# Patient Record
Sex: Female | Born: 1935
Health system: Southern US, Community
[De-identification: ages and names within clinical notes are randomized; demographics above are authoritative.]

## PROBLEM LIST (undated history)

## (undated) DIAGNOSIS — E1122 Type 2 diabetes mellitus with diabetic chronic kidney disease: Secondary | ICD-10-CM

## (undated) DIAGNOSIS — N181 Chronic kidney disease, stage 1: Secondary | ICD-10-CM

## (undated) DIAGNOSIS — H35033 Hypertensive retinopathy, bilateral: Secondary | ICD-10-CM

## (undated) DIAGNOSIS — M85851 Other specified disorders of bone density and structure, right thigh: Secondary | ICD-10-CM

## (undated) DIAGNOSIS — I639 Cerebral infarction, unspecified: Secondary | ICD-10-CM

## (undated) DIAGNOSIS — E119 Type 2 diabetes mellitus without complications: Principal | ICD-10-CM

## (undated) DIAGNOSIS — I10 Essential (primary) hypertension: Secondary | ICD-10-CM

## (undated) DIAGNOSIS — K259 Gastric ulcer, unspecified as acute or chronic, without hemorrhage or perforation: Secondary | ICD-10-CM

## (undated) DIAGNOSIS — E785 Hyperlipidemia, unspecified: Secondary | ICD-10-CM

## (undated) DIAGNOSIS — K219 Gastro-esophageal reflux disease without esophagitis: Secondary | ICD-10-CM

## (undated) DIAGNOSIS — D638 Anemia in other chronic diseases classified elsewhere: Secondary | ICD-10-CM

## (undated) HISTORY — PX: ESOPHAGOGASTRODUODENOSCOPY: SHX1529

## (undated) HISTORY — DX: Hypertensive retinopathy, bilateral: H35.033

## (undated) HISTORY — DX: Type 2 diabetes mellitus with diabetic chronic kidney disease: E11.22

## (undated) HISTORY — DX: Hyperlipidemia, unspecified: E78.5

## (undated) HISTORY — DX: Type 2 diabetes mellitus without complications: E11.9

## (undated) HISTORY — DX: Anemia in other chronic diseases classified elsewhere: D63.8

## (undated) HISTORY — PX: COLONOSCOPY: SHX174

## (undated) HISTORY — DX: Chronic kidney disease, stage 1: N18.1

## (undated) HISTORY — DX: Cerebral infarction, unspecified: I63.9

## (undated) HISTORY — DX: Gastro-esophageal reflux disease without esophagitis: K21.9

## (undated) HISTORY — DX: Essential (primary) hypertension: I10

## (undated) HISTORY — DX: Gastric ulcer, unspecified as acute or chronic, without hemorrhage or perforation: K25.9

---

## 1998-02-15 ENCOUNTER — Encounter: Admission: RE | Admit: 1998-02-15 | Discharge: 1998-02-15 | Payer: Self-pay | Admitting: Internal Medicine

## 1998-04-24 ENCOUNTER — Ambulatory Visit: Admission: RE | Admit: 1998-04-24 | Discharge: 1998-04-24 | Payer: Self-pay

## 1998-07-05 ENCOUNTER — Encounter: Admission: RE | Admit: 1998-07-05 | Discharge: 1998-07-05 | Payer: Self-pay | Admitting: Hematology and Oncology

## 1998-09-11 ENCOUNTER — Encounter: Admission: RE | Admit: 1998-09-11 | Discharge: 1998-09-11 | Payer: Self-pay | Admitting: Internal Medicine

## 1998-10-18 ENCOUNTER — Ambulatory Visit (HOSPITAL_COMMUNITY): Admission: RE | Admit: 1998-10-18 | Discharge: 1998-10-18 | Payer: Self-pay

## 1999-02-18 ENCOUNTER — Encounter: Admission: RE | Admit: 1999-02-18 | Discharge: 1999-02-18 | Payer: Self-pay | Admitting: Internal Medicine

## 1999-02-25 ENCOUNTER — Encounter: Admission: RE | Admit: 1999-02-25 | Discharge: 1999-02-25 | Payer: Self-pay | Admitting: Internal Medicine

## 1999-07-29 ENCOUNTER — Encounter: Admission: RE | Admit: 1999-07-29 | Discharge: 1999-07-29 | Payer: Self-pay | Admitting: Internal Medicine

## 1999-08-02 ENCOUNTER — Encounter: Admission: RE | Admit: 1999-08-02 | Discharge: 1999-08-02 | Payer: Self-pay | Admitting: Internal Medicine

## 1999-11-04 ENCOUNTER — Encounter: Admission: RE | Admit: 1999-11-04 | Discharge: 1999-11-04 | Payer: Self-pay | Admitting: Internal Medicine

## 1999-11-19 ENCOUNTER — Encounter: Admission: RE | Admit: 1999-11-19 | Discharge: 1999-11-19 | Payer: Self-pay | Admitting: Hematology and Oncology

## 1999-11-22 ENCOUNTER — Ambulatory Visit (HOSPITAL_COMMUNITY): Admission: RE | Admit: 1999-11-22 | Discharge: 1999-11-22 | Payer: Self-pay

## 2000-03-19 ENCOUNTER — Encounter: Admission: RE | Admit: 2000-03-19 | Discharge: 2000-03-19 | Payer: Self-pay | Admitting: Internal Medicine

## 2000-07-20 ENCOUNTER — Encounter: Admission: RE | Admit: 2000-07-20 | Discharge: 2000-07-20 | Payer: Self-pay

## 2000-10-12 ENCOUNTER — Encounter: Admission: RE | Admit: 2000-10-12 | Discharge: 2000-10-12 | Payer: Self-pay

## 2000-11-30 ENCOUNTER — Ambulatory Visit (HOSPITAL_COMMUNITY): Admission: RE | Admit: 2000-11-30 | Discharge: 2000-11-30 | Payer: Self-pay

## 2001-01-12 ENCOUNTER — Encounter: Admission: RE | Admit: 2001-01-12 | Discharge: 2001-01-12 | Payer: Self-pay | Admitting: Internal Medicine

## 2001-05-13 ENCOUNTER — Encounter: Admission: RE | Admit: 2001-05-13 | Discharge: 2001-05-13 | Payer: Self-pay | Admitting: Internal Medicine

## 2001-09-10 ENCOUNTER — Encounter: Admission: RE | Admit: 2001-09-10 | Discharge: 2001-09-10 | Payer: Self-pay | Admitting: Internal Medicine

## 2001-12-02 ENCOUNTER — Ambulatory Visit (HOSPITAL_COMMUNITY): Admission: RE | Admit: 2001-12-02 | Discharge: 2001-12-02 | Payer: Self-pay

## 2001-12-10 ENCOUNTER — Encounter: Admission: RE | Admit: 2001-12-10 | Discharge: 2001-12-10 | Payer: Self-pay | Admitting: Internal Medicine

## 2002-04-06 ENCOUNTER — Encounter: Admission: RE | Admit: 2002-04-06 | Discharge: 2002-04-06 | Payer: Self-pay | Admitting: Internal Medicine

## 2002-04-20 ENCOUNTER — Encounter: Admission: RE | Admit: 2002-04-20 | Discharge: 2002-04-20 | Payer: Self-pay | Admitting: Internal Medicine

## 2002-05-06 ENCOUNTER — Encounter: Admission: RE | Admit: 2002-05-06 | Discharge: 2002-05-06 | Payer: Self-pay | Admitting: Internal Medicine

## 2002-09-02 ENCOUNTER — Encounter: Admission: RE | Admit: 2002-09-02 | Discharge: 2002-09-02 | Payer: Self-pay | Admitting: Internal Medicine

## 2002-09-08 ENCOUNTER — Encounter: Admission: RE | Admit: 2002-09-08 | Discharge: 2002-09-08 | Payer: Self-pay | Admitting: Internal Medicine

## 2002-09-09 ENCOUNTER — Encounter: Admission: RE | Admit: 2002-09-09 | Discharge: 2002-09-09 | Payer: Self-pay | Admitting: Internal Medicine

## 2002-09-23 ENCOUNTER — Encounter: Admission: RE | Admit: 2002-09-23 | Discharge: 2002-09-23 | Payer: Self-pay | Admitting: Internal Medicine

## 2003-01-06 ENCOUNTER — Encounter: Admission: RE | Admit: 2003-01-06 | Discharge: 2003-01-06 | Payer: Self-pay | Admitting: Internal Medicine

## 2003-01-13 ENCOUNTER — Ambulatory Visit (HOSPITAL_COMMUNITY): Admission: RE | Admit: 2003-01-13 | Discharge: 2003-01-13 | Payer: Self-pay | Admitting: Internal Medicine

## 2003-01-19 ENCOUNTER — Encounter: Admission: RE | Admit: 2003-01-19 | Discharge: 2003-01-19 | Payer: Self-pay | Admitting: Internal Medicine

## 2003-06-08 ENCOUNTER — Encounter: Admission: RE | Admit: 2003-06-08 | Discharge: 2003-06-08 | Payer: Self-pay | Admitting: Internal Medicine

## 2003-11-17 ENCOUNTER — Encounter: Admission: RE | Admit: 2003-11-17 | Discharge: 2003-11-17 | Payer: Self-pay | Admitting: Internal Medicine

## 2003-11-29 ENCOUNTER — Encounter: Admission: RE | Admit: 2003-11-29 | Discharge: 2003-11-29 | Payer: Self-pay | Admitting: Internal Medicine

## 2003-12-19 ENCOUNTER — Encounter: Admission: RE | Admit: 2003-12-19 | Discharge: 2003-12-19 | Payer: Self-pay | Admitting: Internal Medicine

## 2004-01-16 ENCOUNTER — Ambulatory Visit (HOSPITAL_COMMUNITY): Admission: RE | Admit: 2004-01-16 | Discharge: 2004-01-16 | Payer: Self-pay | Admitting: Internal Medicine

## 2004-03-12 ENCOUNTER — Ambulatory Visit: Payer: Self-pay | Admitting: Internal Medicine

## 2004-04-17 ENCOUNTER — Ambulatory Visit: Payer: Self-pay | Admitting: Internal Medicine

## 2004-05-27 ENCOUNTER — Ambulatory Visit: Payer: Self-pay | Admitting: Internal Medicine

## 2004-08-20 ENCOUNTER — Ambulatory Visit: Payer: Self-pay | Admitting: Internal Medicine

## 2004-08-28 ENCOUNTER — Ambulatory Visit: Payer: Self-pay | Admitting: Internal Medicine

## 2004-12-09 ENCOUNTER — Ambulatory Visit: Payer: Self-pay | Admitting: Internal Medicine

## 2004-12-18 ENCOUNTER — Ambulatory Visit: Payer: Self-pay | Admitting: Internal Medicine

## 2004-12-18 ENCOUNTER — Encounter (INDEPENDENT_AMBULATORY_CARE_PROVIDER_SITE_OTHER): Payer: Self-pay | Admitting: Infectious Diseases

## 2005-02-21 ENCOUNTER — Ambulatory Visit (HOSPITAL_COMMUNITY): Admission: RE | Admit: 2005-02-21 | Discharge: 2005-02-21 | Payer: Self-pay | Admitting: Internal Medicine

## 2005-02-28 ENCOUNTER — Ambulatory Visit: Payer: Self-pay | Admitting: Hospitalist

## 2005-04-17 ENCOUNTER — Encounter (INDEPENDENT_AMBULATORY_CARE_PROVIDER_SITE_OTHER): Payer: Self-pay | Admitting: Infectious Diseases

## 2005-04-17 ENCOUNTER — Ambulatory Visit: Payer: Self-pay | Admitting: Internal Medicine

## 2005-04-24 ENCOUNTER — Ambulatory Visit: Payer: Self-pay | Admitting: Internal Medicine

## 2005-07-14 ENCOUNTER — Ambulatory Visit: Payer: Self-pay | Admitting: Hospitalist

## 2005-08-14 ENCOUNTER — Ambulatory Visit: Payer: Self-pay | Admitting: Internal Medicine

## 2005-09-24 ENCOUNTER — Ambulatory Visit: Payer: Self-pay | Admitting: Internal Medicine

## 2005-09-24 ENCOUNTER — Encounter (INDEPENDENT_AMBULATORY_CARE_PROVIDER_SITE_OTHER): Payer: Self-pay | Admitting: Infectious Diseases

## 2005-10-10 ENCOUNTER — Ambulatory Visit: Payer: Self-pay | Admitting: Internal Medicine

## 2006-01-15 ENCOUNTER — Ambulatory Visit: Payer: Self-pay | Admitting: Internal Medicine

## 2006-01-22 ENCOUNTER — Ambulatory Visit: Payer: Self-pay | Admitting: Internal Medicine

## 2006-02-24 ENCOUNTER — Encounter (INDEPENDENT_AMBULATORY_CARE_PROVIDER_SITE_OTHER): Payer: Self-pay | Admitting: Infectious Diseases

## 2006-02-24 ENCOUNTER — Ambulatory Visit (HOSPITAL_COMMUNITY): Admission: RE | Admit: 2006-02-24 | Discharge: 2006-02-24 | Payer: Self-pay | Admitting: Internal Medicine

## 2006-04-14 DIAGNOSIS — E119 Type 2 diabetes mellitus without complications: Secondary | ICD-10-CM | POA: Insufficient documentation

## 2006-04-14 DIAGNOSIS — I639 Cerebral infarction, unspecified: Secondary | ICD-10-CM

## 2006-04-14 DIAGNOSIS — I10 Essential (primary) hypertension: Secondary | ICD-10-CM

## 2006-04-14 DIAGNOSIS — E785 Hyperlipidemia, unspecified: Secondary | ICD-10-CM

## 2006-04-14 DIAGNOSIS — E1122 Type 2 diabetes mellitus with diabetic chronic kidney disease: Secondary | ICD-10-CM | POA: Insufficient documentation

## 2006-04-14 DIAGNOSIS — N181 Chronic kidney disease, stage 1: Secondary | ICD-10-CM

## 2006-04-14 HISTORY — DX: Cerebral infarction, unspecified: I63.9

## 2006-04-14 HISTORY — DX: Hyperlipidemia, unspecified: E78.5

## 2006-04-14 HISTORY — DX: Essential (primary) hypertension: I10

## 2006-04-14 HISTORY — DX: Type 2 diabetes mellitus without complications: E11.9

## 2006-06-01 ENCOUNTER — Encounter (INDEPENDENT_AMBULATORY_CARE_PROVIDER_SITE_OTHER): Payer: Self-pay | Admitting: Infectious Diseases

## 2006-06-01 ENCOUNTER — Ambulatory Visit: Payer: Self-pay | Admitting: Internal Medicine

## 2006-06-01 LAB — CONVERTED CEMR LAB
Glucose, Bld: 93 mg/dL (ref 70–99)
Sodium: 142 meq/L (ref 135–145)

## 2006-06-15 ENCOUNTER — Ambulatory Visit: Payer: Self-pay | Admitting: Internal Medicine

## 2006-06-25 ENCOUNTER — Encounter (INDEPENDENT_AMBULATORY_CARE_PROVIDER_SITE_OTHER): Payer: Self-pay | Admitting: Infectious Diseases

## 2006-06-25 ENCOUNTER — Ambulatory Visit: Payer: Self-pay | Admitting: Internal Medicine

## 2006-06-25 LAB — CONVERTED CEMR LAB
HCT: 31.5 % — ABNORMAL LOW (ref 36.0–46.0)
Hemoglobin: 10.2 g/dL — ABNORMAL LOW (ref 12.0–15.0)
MCV: 91.6 fL (ref 78.0–100.0)
RBC: 3.44 M/uL — ABNORMAL LOW (ref 3.87–5.11)

## 2006-07-08 DIAGNOSIS — D649 Anemia, unspecified: Secondary | ICD-10-CM | POA: Insufficient documentation

## 2006-07-08 DIAGNOSIS — D638 Anemia in other chronic diseases classified elsewhere: Secondary | ICD-10-CM | POA: Insufficient documentation

## 2006-07-08 HISTORY — DX: Anemia in other chronic diseases classified elsewhere: D63.8

## 2006-07-17 ENCOUNTER — Telehealth (INDEPENDENT_AMBULATORY_CARE_PROVIDER_SITE_OTHER): Payer: Self-pay | Admitting: Hospitalist

## 2006-08-06 ENCOUNTER — Telehealth: Payer: Self-pay | Admitting: *Deleted

## 2006-08-07 ENCOUNTER — Telehealth: Payer: Self-pay | Admitting: *Deleted

## 2006-08-17 ENCOUNTER — Telehealth (INDEPENDENT_AMBULATORY_CARE_PROVIDER_SITE_OTHER): Payer: Self-pay | Admitting: Hospitalist

## 2006-08-28 ENCOUNTER — Telehealth: Payer: Self-pay | Admitting: *Deleted

## 2006-09-09 ENCOUNTER — Ambulatory Visit: Payer: Self-pay | Admitting: Internal Medicine

## 2006-09-09 ENCOUNTER — Encounter (INDEPENDENT_AMBULATORY_CARE_PROVIDER_SITE_OTHER): Payer: Self-pay | Admitting: Infectious Diseases

## 2006-09-09 LAB — CONVERTED CEMR LAB
BUN: 25 mg/dL — ABNORMAL HIGH (ref 6–23)
Calcium: 9.8 mg/dL (ref 8.4–10.5)
Ferritin: 179 ng/mL (ref 10–291)
Glucose, Bld: 148 mg/dL — ABNORMAL HIGH (ref 70–99)
HCT: 32 % — ABNORMAL LOW (ref 36.0–46.0)
Iron: 52 ug/dL (ref 42–145)
MCV: 90.9 fL (ref 78.0–100.0)
Platelets: 254 10*3/uL (ref 150–400)
Potassium: 3.4 meq/L — ABNORMAL LOW (ref 3.5–5.3)
RBC: 3.52 M/uL — ABNORMAL LOW (ref 3.87–5.11)
RDW: 14.8 % — ABNORMAL HIGH (ref 11.5–14.0)
Saturation Ratios: 18 % — ABNORMAL LOW (ref 20–55)
TIBC: 293 ug/dL (ref 250–470)
UIBC: 241 ug/dL

## 2006-09-10 ENCOUNTER — Telehealth: Payer: Self-pay | Admitting: *Deleted

## 2006-09-24 ENCOUNTER — Ambulatory Visit: Payer: Self-pay | Admitting: *Deleted

## 2006-09-29 ENCOUNTER — Encounter (INDEPENDENT_AMBULATORY_CARE_PROVIDER_SITE_OTHER): Payer: Self-pay | Admitting: Infectious Diseases

## 2006-09-29 ENCOUNTER — Ambulatory Visit: Payer: Self-pay | Admitting: Internal Medicine

## 2006-10-22 ENCOUNTER — Encounter (INDEPENDENT_AMBULATORY_CARE_PROVIDER_SITE_OTHER): Payer: Self-pay | Admitting: Infectious Diseases

## 2006-10-22 ENCOUNTER — Ambulatory Visit: Payer: Self-pay | Admitting: Internal Medicine

## 2006-11-27 ENCOUNTER — Encounter: Payer: Self-pay | Admitting: Internal Medicine

## 2006-11-27 ENCOUNTER — Ambulatory Visit: Payer: Self-pay | Admitting: Internal Medicine

## 2006-11-27 ENCOUNTER — Encounter (INDEPENDENT_AMBULATORY_CARE_PROVIDER_SITE_OTHER): Payer: Self-pay | Admitting: Infectious Diseases

## 2007-02-02 ENCOUNTER — Telehealth (INDEPENDENT_AMBULATORY_CARE_PROVIDER_SITE_OTHER): Payer: Self-pay | Admitting: *Deleted

## 2007-02-18 ENCOUNTER — Telehealth: Payer: Self-pay | Admitting: Internal Medicine

## 2007-02-22 ENCOUNTER — Ambulatory Visit: Payer: Self-pay | Admitting: Internal Medicine

## 2007-02-22 ENCOUNTER — Encounter (INDEPENDENT_AMBULATORY_CARE_PROVIDER_SITE_OTHER): Payer: Self-pay | Admitting: Infectious Diseases

## 2007-02-23 DIAGNOSIS — E876 Hypokalemia: Secondary | ICD-10-CM

## 2007-02-23 DIAGNOSIS — K259 Gastric ulcer, unspecified as acute or chronic, without hemorrhage or perforation: Secondary | ICD-10-CM | POA: Insufficient documentation

## 2007-02-23 HISTORY — DX: Gastric ulcer, unspecified as acute or chronic, without hemorrhage or perforation: K25.9

## 2007-02-23 LAB — CONVERTED CEMR LAB
ALT: 8 units/L (ref 0–35)
AST: 18 units/L (ref 0–37)
BUN: 28 mg/dL — ABNORMAL HIGH (ref 6–23)
Chloride: 101 meq/L (ref 96–112)
Hemoglobin: 10.5 g/dL — ABNORMAL LOW (ref 12.0–15.0)
MCHC: 31.8 g/dL (ref 30.0–36.0)
Platelets: 236 10*3/uL (ref 150–400)
Sodium: 140 meq/L (ref 135–145)
Total Bilirubin: 0.3 mg/dL (ref 0.3–1.2)
Total Protein: 7.1 g/dL (ref 6.0–8.3)
WBC: 4.3 10*3/uL (ref 4.0–10.5)

## 2007-03-26 ENCOUNTER — Ambulatory Visit (HOSPITAL_COMMUNITY): Admission: RE | Admit: 2007-03-26 | Discharge: 2007-03-26 | Payer: Self-pay | Admitting: Infectious Diseases

## 2007-04-02 ENCOUNTER — Encounter (INDEPENDENT_AMBULATORY_CARE_PROVIDER_SITE_OTHER): Payer: Self-pay | Admitting: Infectious Diseases

## 2007-04-02 ENCOUNTER — Ambulatory Visit: Payer: Self-pay | Admitting: Hospitalist

## 2007-04-06 ENCOUNTER — Encounter (INDEPENDENT_AMBULATORY_CARE_PROVIDER_SITE_OTHER): Payer: Self-pay | Admitting: Infectious Diseases

## 2007-04-06 LAB — CONVERTED CEMR LAB
Cholesterol: 221 mg/dL — ABNORMAL HIGH (ref 0–200)
LDL Cholesterol: 158 mg/dL — ABNORMAL HIGH (ref 0–99)
Total CHOL/HDL Ratio: 3.9
VLDL: 6 mg/dL (ref 0–40)

## 2007-08-26 ENCOUNTER — Encounter (INDEPENDENT_AMBULATORY_CARE_PROVIDER_SITE_OTHER): Payer: Self-pay | Admitting: Infectious Diseases

## 2007-09-02 ENCOUNTER — Ambulatory Visit: Payer: Self-pay | Admitting: Internal Medicine

## 2007-09-02 ENCOUNTER — Telehealth (INDEPENDENT_AMBULATORY_CARE_PROVIDER_SITE_OTHER): Payer: Self-pay | Admitting: Infectious Diseases

## 2007-09-02 LAB — CONVERTED CEMR LAB
Blood Glucose, Fingerstick: 119
Hgb A1c MFr Bld: 6.1 %

## 2007-09-03 ENCOUNTER — Telehealth: Payer: Self-pay | Admitting: *Deleted

## 2007-09-09 ENCOUNTER — Ambulatory Visit: Payer: Self-pay | Admitting: *Deleted

## 2007-09-09 ENCOUNTER — Encounter (INDEPENDENT_AMBULATORY_CARE_PROVIDER_SITE_OTHER): Payer: Self-pay | Admitting: Infectious Diseases

## 2007-09-10 LAB — CONVERTED CEMR LAB
AST: 19 units/L (ref 0–37)
Alkaline Phosphatase: 52 units/L (ref 39–117)
CO2: 29 meq/L (ref 19–32)
Calcium: 9.3 mg/dL (ref 8.4–10.5)
Chloride: 104 meq/L (ref 96–112)
Cholesterol: 244 mg/dL — ABNORMAL HIGH (ref 0–200)
Creatinine, Ser: 0.81 mg/dL (ref 0.40–1.20)
Glucose, Bld: 97 mg/dL (ref 70–99)
LDL Cholesterol: 177 mg/dL — ABNORMAL HIGH (ref 0–99)
Total CHOL/HDL Ratio: 4.4
Triglycerides: 58 mg/dL (ref <150)
VLDL: 12 mg/dL (ref 0–40)

## 2007-09-13 ENCOUNTER — Telehealth: Payer: Self-pay | Admitting: *Deleted

## 2007-10-12 ENCOUNTER — Telehealth (INDEPENDENT_AMBULATORY_CARE_PROVIDER_SITE_OTHER): Payer: Self-pay | Admitting: Infectious Diseases

## 2008-01-25 ENCOUNTER — Encounter: Payer: Self-pay | Admitting: Internal Medicine

## 2008-01-25 ENCOUNTER — Ambulatory Visit: Payer: Self-pay | Admitting: Internal Medicine

## 2008-01-25 LAB — CONVERTED CEMR LAB
Blood Glucose, Fingerstick: 106
Calcium: 9.5 mg/dL (ref 8.4–10.5)
Chloride: 100 meq/L (ref 96–112)
Creatinine, Urine: 90.3 mg/dL
Glucose, Bld: 89 mg/dL (ref 70–99)
Hgb A1c MFr Bld: 6.1 %
Microalb Creat Ratio: 5.9 mg/g (ref 0.0–30.0)
Sodium: 139 meq/L (ref 135–145)
Triglycerides: 78 mg/dL (ref <150)

## 2008-03-30 ENCOUNTER — Ambulatory Visit (HOSPITAL_COMMUNITY): Admission: RE | Admit: 2008-03-30 | Discharge: 2008-03-30 | Payer: Self-pay | Admitting: Internal Medicine

## 2008-07-07 ENCOUNTER — Telehealth: Payer: Self-pay | Admitting: *Deleted

## 2008-08-07 ENCOUNTER — Ambulatory Visit: Payer: Self-pay | Admitting: Internal Medicine

## 2008-08-07 ENCOUNTER — Encounter: Payer: Self-pay | Admitting: Internal Medicine

## 2008-08-07 LAB — CONVERTED CEMR LAB: Blood Glucose, Fingerstick: 137

## 2008-08-08 LAB — CONVERTED CEMR LAB
AST: 20 units/L (ref 0–37)
Albumin: 3.8 g/dL (ref 3.5–5.2)
Alkaline Phosphatase: 56 units/L (ref 39–117)
BUN: 21 mg/dL (ref 6–23)
Basophils Absolute: 0 10*3/uL (ref 0.0–0.1)
Basophils Relative: 0 % (ref 0–1)
CO2: 24 meq/L (ref 19–32)
Calcium: 9.3 mg/dL (ref 8.4–10.5)
Eosinophils Absolute: 0.2 10*3/uL (ref 0.0–0.7)
Glucose, Bld: 123 mg/dL — ABNORMAL HIGH (ref 70–99)
HCT: 31.6 % — ABNORMAL LOW (ref 36.0–46.0)
HDL: 50 mg/dL (ref >39)
Lymphocytes Relative: 39 % (ref 12–46)
MCHC: 32.3 g/dL (ref 30.0–36.0)
Monocytes Absolute: 0.5 10*3/uL (ref 0.1–1.0)
Neutro Abs: 2.2 10*3/uL (ref 1.7–7.7)
Platelets: 224 10*3/uL (ref 150–400)
Sodium: 146 meq/L — ABNORMAL HIGH (ref 135–145)
Total Bilirubin: 0.2 mg/dL — ABNORMAL LOW (ref 0.3–1.2)
Triglycerides: 104 mg/dL (ref <150)

## 2008-09-05 ENCOUNTER — Telehealth: Payer: Self-pay | Admitting: Internal Medicine

## 2008-09-19 ENCOUNTER — Telehealth: Payer: Self-pay | Admitting: Internal Medicine

## 2008-10-26 ENCOUNTER — Encounter: Payer: Self-pay | Admitting: Internal Medicine

## 2008-10-26 ENCOUNTER — Ambulatory Visit: Payer: Self-pay | Admitting: Internal Medicine

## 2008-10-26 LAB — CONVERTED CEMR LAB
Blood Glucose, Fingerstick: 101
Ferritin: 137 ng/mL (ref 10–291)
Hgb A1c MFr Bld: 6.3 %
Iron: 57 ug/dL (ref 42–145)
RBC Folate: 632 ng/mL — ABNORMAL HIGH (ref 180–600)
Saturation Ratios: 20 % (ref 20–55)

## 2009-01-08 ENCOUNTER — Telehealth: Payer: Self-pay | Admitting: Internal Medicine

## 2009-04-03 ENCOUNTER — Ambulatory Visit: Payer: Self-pay | Admitting: Internal Medicine

## 2009-04-03 LAB — CONVERTED CEMR LAB: Hgb A1c MFr Bld: 6.2 %

## 2009-04-06 ENCOUNTER — Ambulatory Visit: Payer: Self-pay | Admitting: Internal Medicine

## 2009-04-10 LAB — CONVERTED CEMR LAB
ALT: 11 units/L (ref 0–35)
AST: 18 units/L (ref 0–37)
Alkaline Phosphatase: 50 units/L (ref 39–117)
BUN: 18 mg/dL (ref 6–23)
Basophils Absolute: 0 10*3/uL (ref 0.0–0.1)
Basophils Relative: 1 % (ref 0–1)
Cholesterol: 231 mg/dL — ABNORMAL HIGH (ref 0–200)
Eosinophils Absolute: 0.1 10*3/uL (ref 0.0–0.7)
Eosinophils Relative: 3 % (ref 0–5)
HDL: 57 mg/dL (ref >39)
Hemoglobin: 10.2 g/dL — ABNORMAL LOW (ref 12.0–15.0)
LDL Cholesterol: 164 mg/dL — ABNORMAL HIGH (ref 0–99)
Monocytes Absolute: 0.3 10*3/uL (ref 0.1–1.0)
Monocytes Relative: 7 % (ref 3–12)
Neutro Abs: 2.8 10*3/uL (ref 1.7–7.7)
Neutrophils Relative %: 58 % (ref 43–77)
Potassium: 4 meq/L (ref 3.5–5.3)
RBC: 3.42 M/uL — ABNORMAL LOW (ref 3.87–5.11)
RDW: 14.1 % (ref 11.5–15.5)
Total Protein: 6.8 g/dL (ref 6.0–8.3)
VLDL: 10 mg/dL (ref 0–40)
WBC: 4.8 10*3/uL (ref 4.0–10.5)

## 2009-04-26 ENCOUNTER — Ambulatory Visit (HOSPITAL_COMMUNITY): Admission: RE | Admit: 2009-04-26 | Discharge: 2009-04-26 | Payer: Self-pay | Admitting: Internal Medicine

## 2009-05-28 ENCOUNTER — Telehealth: Payer: Self-pay | Admitting: Internal Medicine

## 2009-07-13 ENCOUNTER — Ambulatory Visit: Payer: Self-pay | Admitting: Internal Medicine

## 2009-07-13 LAB — CONVERTED CEMR LAB
Blood Glucose, Fingerstick: 112
Hgb A1c MFr Bld: 6.1 %

## 2009-07-27 ENCOUNTER — Telehealth: Payer: Self-pay | Admitting: Internal Medicine

## 2009-10-01 ENCOUNTER — Telehealth: Payer: Self-pay | Admitting: Internal Medicine

## 2009-11-05 ENCOUNTER — Telehealth: Payer: Self-pay | Admitting: Internal Medicine

## 2009-12-27 ENCOUNTER — Telehealth: Payer: Self-pay | Admitting: Internal Medicine

## 2010-01-15 ENCOUNTER — Telehealth: Payer: Self-pay | Admitting: Internal Medicine

## 2010-03-01 ENCOUNTER — Telehealth: Payer: Self-pay | Admitting: Internal Medicine

## 2010-04-03 ENCOUNTER — Ambulatory Visit: Payer: Self-pay | Admitting: Internal Medicine

## 2010-04-03 LAB — CONVERTED CEMR LAB: Blood Glucose, Fingerstick: 92

## 2010-04-04 ENCOUNTER — Ambulatory Visit: Payer: Self-pay | Admitting: Internal Medicine

## 2010-04-04 LAB — CONVERTED CEMR LAB
Albumin: 4.1 g/dL (ref 3.5–5.2)
Alkaline Phosphatase: 59 units/L (ref 39–117)
Chloride: 105 meq/L (ref 96–112)
Cholesterol: 194 mg/dL (ref 0–200)
HCT: 31.3 % — ABNORMAL LOW (ref 36.0–46.0)
LDL Cholesterol: 132 mg/dL — ABNORMAL HIGH (ref 0–99)
MCHC: 33.2 g/dL (ref 30.0–36.0)
Platelets: 222 10*3/uL (ref 150–400)
RBC: 3.41 M/uL — ABNORMAL LOW (ref 3.87–5.11)
RDW: 14.5 % (ref 11.5–15.5)
Total CHOL/HDL Ratio: 3.6
Total Protein: 7.3 g/dL (ref 6.0–8.3)
Triglycerides: 39 mg/dL (ref <150)
VLDL: 8 mg/dL (ref 0–40)

## 2010-05-07 ENCOUNTER — Telehealth: Payer: Self-pay | Admitting: Internal Medicine

## 2010-05-29 ENCOUNTER — Ambulatory Visit (HOSPITAL_COMMUNITY)
Admission: RE | Admit: 2010-05-29 | Discharge: 2010-05-29 | Payer: Self-pay | Source: Home / Self Care | Admitting: Family Medicine

## 2010-05-29 ENCOUNTER — Encounter: Payer: Self-pay | Admitting: Internal Medicine

## 2010-05-29 LAB — HM MAMMOGRAPHY: HM Mammogram: NEGATIVE

## 2010-07-20 ENCOUNTER — Encounter: Payer: Self-pay | Admitting: Internal Medicine

## 2010-07-21 ENCOUNTER — Encounter: Payer: Self-pay | Admitting: Internal Medicine

## 2010-07-30 NOTE — Progress Notes (Signed)
Summary: refill/gg  Phone Note Refill Request  on December 27, 2009 4:37 PM  Refills Requested: Medication #1:  ENALAPRIL MALEATE 20 MG TABS Take 1 tablet  by mouth once daily   Last Refilled: 11/28/2009  Method Requested: Electronic Initial call taken by: Merrie Roof RN,  December 27, 2009 4:37 PM    Prescriptions: ENALAPRIL MALEATE 20 MG TABS (ENALAPRIL MALEATE) Take 1 tablet  by mouth once daily  #30 x 3   Entered and Authorized by:   Laren Everts MD   Signed by:   Laren Everts MD on 12/28/2009   Method used:   Electronically to        [facility] (retail)       [address]       [place], [place]  [address]       Ph: [phone]       Fax: [phone]   RxID:   [id number]

## 2010-07-30 NOTE — Progress Notes (Signed)
Summary: REfill/gh  Phone Note Refill Request Message from:  Patient on July 27, 2009 11:55 AM  Refills Requested: Medication #1:  NORVASC 10 MG TABS Take 1 tablet by mouth once a day   Last Refilled: 06/07/2009  Method Requested: Electronic Initial call taken by: Angelina Ok RN,  July 27, 2009 11:56 AM    Prescriptions: NORVASC 10 MG TABS (AMLODIPINE BESYLATE) Take 1 tablet by mouth once a day  #30 x 6   Entered and Authorized by:   Laren Everts MD   Signed by:   Laren Everts MD on 07/27/2009   Method used:   Electronically to        [facility] (retail)       [address]       [place], [place]  [address]       Ph: [phone]       Fax: [phone]   RxID:   [id number]

## 2010-07-30 NOTE — Assessment & Plan Note (Signed)
Summary: EST-CK/FU/MEDS/CFB   Vital Signs:  Patient profile:   75 year old female Height:      65 inches Weight:      144.2 pounds BMI:     24.08 Temp:     97.8 degrees F oral Pulse rate:   82 / minute BP sitting:   130 / 65  (right arm)  Vitals Entered By: Filomena Jungling NT II (July 13, 2009 1:55 PM) CC: CHECKUP Is Patient Diabetic? Yes Did you bring your meter with you today? No Pain Assessment Patient in pain? yes     Location: KNEES Intensity: 5 Type: aching Nutritional Status BMI of 19 -24 = normal CBG Result 112  Have you ever been in a relationship where you felt threatened, hurt or afraid?No   Does patient need assistance? Functional Status Self care Ambulation Normal   Primary Care Provider:  Laren Everts MD  CC:  CHECKUP.  History of Present Illness: 75 yr old woman with pmhx as described below comes to the clinic for regular checkup. Patient reports that she got humana now and she can afford being on another statin with this new insurance. Only complains is an episode of arthritis pain on right knee during December that was alleviated with tylenol arthritis.   Patient reports that she is going to see Opthalmologist for Diabetic Eye exam in the next month.  Preventive Screening-Counseling & Management  Alcohol-Tobacco     Smoking Status: quit     Year Quit: 40 years  Caffeine-Diet-Exercise     Does Patient Exercise: yes     Type of exercise: walking  Problems Prior to Update: 1)  Preventive Health Care  (ICD-V70.0) 2)  Diabetes Mellitus, Type II  (ICD-250.00) 3)  Hyperlipidemia  (ICD-272.4) 4)  Hypertension  (ICD-401.9) 5)  Pud  (ICD-533.90) 6)  Hypokalemia, Mild  (ICD-276.8) 7)  Anemia Nos  (ICD-285.9) 8)  Cerebrovascular Accident, Hx of  (ICD-V12.50)  Medications Prior to Update: 1)  Metformin Hcl 500 Mg Tabs (Metformin Hcl) .... Take 1 Tab By Mouth Every 12 Hours 2)  Enalapril Maleate 20 Mg Tabs (Enalapril Maleate) .... Take 1  Tablet  By Mouth Once Daily 3)  Hydrochlorothiazide 25 Mg Tabs (Hydrochlorothiazide) .... Take 1 Tablet By Mouth Once A Day 4)  Aspirin 81 Mg Chew (Aspirin) .... Take 1 Tablet By Mouth Once A Day 5)  Norvasc 10 Mg Tabs (Amlodipine Besylate) .... Take 1 Tablet By Mouth Once A Day 6)  Omeprazole 20 Mg  Cpdr (Omeprazole) .... Take 1 Tablet By Mouth Once A Day 7)  Rel-Ultima Test Strips #50 .... Test Twice A Day 8)  Pravachol 40 Mg Tabs (Pravastatin Sodium) .... Take 2 Tablets By Mouth Once A Day  Current Medications (verified): 1)  Metformin Hcl 500 Mg Tabs (Metformin Hcl) .... Take 1 Tab By Mouth Every 12 Hours 2)  Enalapril Maleate 20 Mg Tabs (Enalapril Maleate) .... Take 1 Tablet  By Mouth Once Daily 3)  Hydrochlorothiazide 25 Mg Tabs (Hydrochlorothiazide) .... Take 1 Tablet By Mouth Once A Day 4)  Aspirin 81 Mg Chew (Aspirin) .... Take 1 Tablet By Mouth Once A Day 5)  Norvasc 10 Mg Tabs (Amlodipine Besylate) .... Take 1 Tablet By Mouth Once A Day 6)  Omeprazole 20 Mg  Cpdr (Omeprazole) .... Take 1 Tablet By Mouth Once A Day 7)  Rel-Ultima Test Strips #50 .... Test Twice A Day 8)  Pravachol 40 Mg Tabs (Pravastatin Sodium) .... Take 2 Tablets By Mouth Once A  Day  Allergies: No Known Drug Allergies  Past History:  Past Medical History: Last updated: 04/14/2006 Diabetes mellitus, type II Hyperlipidemia Hypertension Cerebrovascular accident, hx of  Social History: Last updated: 09/09/2006 Retired Single Former Smoker Alcohol use-no  Risk Factors: Exercise: yes (07/13/2009)  Risk Factors: Smoking Status: quit (07/13/2009)  Social History: Reviewed history from 09/09/2006 and no changes required. Retired Single Former Smoker Alcohol use-no  Review of Systems  The patient denies fever, chest pain, dyspnea on exertion, peripheral edema, prolonged cough, headaches, hemoptysis, abdominal pain, melena, hematochezia, hematuria, muscle weakness, difficulty walking, and abnormal  bleeding.    Physical Exam  General:  NAD Mouth:  MMM Neck:  supple.   Lungs:  Normal respiratory effort, chest expands symmetrically. Lungs are clear to auscultation, no crackles or wheezes. Heart:  Normal rate and regular rhythm. S1 and S2 normal without gallop, murmur, click, rub or other extra sounds. Abdomen:  soft, non-tender, normal bowel sounds, and no distention.   Msk:  normal ROM.   Pulses:  2+ dp/pt pulses Extremities:  no edema Neurologic:  alert & oriented X3, cranial nerves II-XII intact, strength normal in all extremities, and sensation intact to light touch.   Psych:  normally interactive.     Impression & Recommendations:  Problem # 1:  HYPERLIPIDEMIA (ICD-272.4) Not at goal <100. Patient can now afford Lipitor due to change in her insurance. Will start on lipitor and recheck FLP and cmet on follow up.  Her updated medication list for this problem includes:    Lipitor 40 Mg Tabs (Atorvastatin calcium) .Marland Kitchen... Take 1 tablet by mouth once a day  Problem # 2:  DIABETES MELLITUS, TYPE II (ICD-250.00)  At goal. No change to medicaiton. Patient is going to schedule her Diabetic Eye exam. Will follow up.  Her updated medication list for this problem includes:    Metformin Hcl 500 Mg Tabs (Metformin hcl) .Marland Kitchen... Take 1 tab by mouth every 12 hours    Enalapril Maleate 20 Mg Tabs (Enalapril maleate) .Marland Kitchen... Take 1 tablet  by mouth once daily    Aspirin 81 Mg Chew (Aspirin) .Marland Kitchen... Take 1 tablet by mouth once a day  Labs Reviewed: Creat: 0.83 (04/06/2009)    Reviewed HgBA1c results: 6.1 (07/13/2009)  6.2 (04/03/2009)  Problem # 3:  HYPERTENSION (ICD-401.9) At goal. Continue current meds.   Her updated medication list for this problem includes:    Enalapril Maleate 20 Mg Tabs (Enalapril maleate) .Marland Kitchen... Take 1 tablet  by mouth once daily    Hydrochlorothiazide 25 Mg Tabs (Hydrochlorothiazide) .Marland Kitchen... Take 1 tablet by mouth once a day    Norvasc 10 Mg Tabs (Amlodipine besylate)  .Marland Kitchen... Take 1 tablet by mouth once a day  BP today: 130/65 Prior BP: 109/69 (04/03/2009)  Labs Reviewed: K+: 4.0 (04/06/2009) Creat: : 0.83 (04/06/2009)   Chol: 231 (04/06/2009)   HDL: 57 (04/06/2009)   LDL: 164 (04/06/2009)   TG: 49 (04/06/2009)  Complete Medication List: 1)  Metformin Hcl 500 Mg Tabs (Metformin hcl) .... Take 1 tab by mouth every 12 hours 2)  Enalapril Maleate 20 Mg Tabs (Enalapril maleate) .... Take 1 tablet  by mouth once daily 3)  Hydrochlorothiazide 25 Mg Tabs (Hydrochlorothiazide) .... Take 1 tablet by mouth once a day 4)  Aspirin 81 Mg Chew (Aspirin) .... Take 1 tablet by mouth once a day 5)  Norvasc 10 Mg Tabs (Amlodipine besylate) .... Take 1 tablet by mouth once a day 6)  Omeprazole 20 Mg Cpdr (Omeprazole) .Marland KitchenMarland KitchenMarland Kitchen  Take 1 tablet by mouth once a day 7)  Rel-ultima Test Strips #50  .... Test twice a day 8)  Lipitor 40 Mg Tabs (Atorvastatin calcium) .... Take 1 tablet by mouth once a day  Other Orders: T- Capillary Blood Glucose (82948) T-Hgb A1C (in-house) ([address]UE)  Patient Instructions: 1)  Please schedule a follow-up appointment in 3 months. 2)  Remember stop taking Pravastatin and start taking Lipitor as prescribed. 3)  Continue to take all other medication as directed. 4)  Go to your Diabetic Eye exam.  Prescriptions: LIPITOR 40 MG TABS (ATORVASTATIN CALCIUM) Take 1 tablet by mouth once a day  #30 x 3   Entered and Authorized by:   Laren Everts MD   Signed by:   Laren Everts MD on 07/13/2009   Method used:   Electronically to        [facility] (retail)       [address]       [place], [place]  [address]       Ph: [phone]       Fax: [phone]   RxID:   [id number]   Prevention & Chronic Care Immunizations   Influenza vaccine: Fluvax 3+  (04/03/2009)    Tetanus booster: Not documented    Pneumococcal vaccine: Historical  (07/20/2000)    H. zoster vaccine: Not  documented  Colorectal Screening   Hemoccult: Positive  (06/06/2006)    Colonoscopy: Not documented  Other Screening   Pap smear: Not documented    Mammogram: ASSESSMENT: Negative - BI-RADS 1^MM DIGITAL SCREENING  (03/30/2008)    DXA bone density scan: Not documented   DXA bone density action/deferral: Refused  (04/03/2009)   Smoking status: quit  (07/13/2009)  Diabetes Mellitus   HgbA1C: 6.1  (07/13/2009)    Eye exam: Not documented   Diabetic eye exam action/deferral: Ophthalmology referral  (04/03/2009)    Foot exam: yes  (01/25/2008)   Foot exam action/deferral: Do today   High risk foot: Not documented   Foot care education: Not documented    Urine microalbumin/creatinine ratio: 5.9  (01/25/2008)   Urine microalbumin action/deferral: Ordered    Diabetes flowsheet reviewed?: Yes   Progress toward A1C goal: At goal  Lipids   Total Cholesterol: 231  (04/06/2009)   LDL: 164  (04/06/2009)   LDL Direct: Not documented   HDL: 57  (04/06/2009)   Triglycerides: 49  (04/06/2009)    SGOT (AST): 18  (04/06/2009)   SGPT (ALT): 11  (04/06/2009)   Alkaline phosphatase: 50  (04/06/2009)   Total bilirubin: 0.4  (04/06/2009)    Lipid flowsheet reviewed?: Yes   Progress toward LDL goal: Deteriorated  Hypertension   Last Blood Pressure: 130 / 65  (07/13/2009)   Serum creatinine: 0.83  (04/06/2009)   Serum potassium 4.0  (04/06/2009)    Hypertension flowsheet reviewed?: Yes   Progress toward BP goal: At goal  Self-Management Support :   Personal Goals (by the next clinic visit) :     Personal A1C goal: 6  (04/03/2009)     Personal blood pressure goal: 130/80  (04/03/2009)     Personal LDL goal: 100  (04/03/2009)    Patient will work on the following items until the next clinic visit to reach self-care goals:     Medications and monitoring: take my medicines every day  (07/13/2009)     Eating: eat more vegetables, use fresh or frozen vegetables, eat foods  that are low  in salt, eat baked foods instead of fried foods  (07/13/2009)     Activity: take a 30 minute walk every day  (07/13/2009)    Diabetes self-management support: Written self-care plan  (07/13/2009)   Diabetes care plan printed    Hypertension self-management support: Written self-care plan  (07/13/2009)   Hypertension self-care plan printed.    Lipid self-management support: Written self-care plan  (07/13/2009)   Lipid self-care plan printed.   Laboratory Results   Blood Tests   Date/Time Received: July 13, 2009 2:06 PM Date/Time Reported: .sign   HGBA1C: 6.1%   (Normal Range: Non-Diabetic - 3-6%   Control Diabetic - 6-8%) CBG Random:: 112mg /dL

## 2010-07-30 NOTE — Assessment & Plan Note (Signed)
Summary: est-ck/fu/meds/cfb   Vital Signs:  Patient profile:   75 year old female Height:      65 inches (165.10 cm) Weight:      144.1 pounds (65.50 kg) BMI:     24.07 Temp:     98.1 degrees F (36.72 degrees C) oral Pulse rate:   75 / minute BP sitting:   109 / 60  (right arm) Cuff size:   regular  Vitals Entered By: Cynda Familia Duncan Dull) (April 03, 2010 4:38 PM) CC: routine f/u, med refill Is Patient Diabetic? Yes Did you bring your meter with you today? No Pain Assessment Patient in pain? no      Nutritional Status BMI of 25 - 29 = overweight CBG Result 92  Have you ever been in a relationship where you felt threatened, hurt or afraid?No   Does patient need assistance? Functional Status Self care Ambulation Normal   Diabetic Foot Exam Foot Inspection Is there a history of a foot ulcer?              No Is there a foot ulcer now?              No Can the patient see the bottom of their feet?          Yes Are the shoes appropriate in style and fit?          Yes Is there swelling or an abnormal foot shape?          No Are the toenails long?                No Are the toenails thick?                Yes Are the toenails ingrown?              No Is there heavy callous build-up?              No  Diabetic Foot Care Education Patient educated on appropriate care of diabetic feet.  Pulse Check          Right Foot          Left Foot Posterior Tibial:        normal            normal Dorsalis Pedis:        normal            normal  High Risk Feet? No   10-g (5.07) Semmes-Weinstein Monofilament Test Performed by: Lynn Ito          Right Foot          Left Foot Test Control      normal         normal Site 1         normal         normal Site 4         normal         normal Site 5         normal         normal Site 6         normal         normal  Impression      normal         normal   Primary Care Provider:  Laren Everts MD  CC:  routine f/u and  med refill.  History of Present Illness: 75 yr old woman with pmhx  as described below comes to the clinic for follow up. No complains. Would like to get the flu shot.   Preventive Screening-Counseling & Management  Alcohol-Tobacco     Smoking Status: quit     Year Quit: 40 years  Problems Prior to Update: 1)  Preventive Health Care  (ICD-V70.0) 2)  Diabetes Mellitus, Type II  (ICD-250.00) 3)  Hyperlipidemia  (ICD-272.4) 4)  Hypertension  (ICD-401.9) 5)  Pud  (ICD-533.90) 6)  Hypokalemia, Mild  (ICD-276.8) 7)  Anemia Nos  (ICD-285.9) 8)  Cerebrovascular Accident, Hx of  (ICD-V12.50)  Medications Prior to Update: 1)  Metformin Hcl 500 Mg Tabs (Metformin Hcl) .... Take 1 Tab By Mouth Every 12 Hours 2)  Enalapril Maleate 20 Mg Tabs (Enalapril Maleate) .... Take 1 Tablet  By Mouth Once Daily 3)  Hydrochlorothiazide 25 Mg Tabs (Hydrochlorothiazide) .... Take 1 Tablet By Mouth Once A Day 4)  Aspirin 81 Mg Chew (Aspirin) .... Take 1 Tablet By Mouth Once A Day 5)  Norvasc 10 Mg Tabs (Amlodipine Besylate) .... Take 1 Tablet By Mouth Once A Day 6)  Omeprazole 20 Mg  Cpdr (Omeprazole) .... Take 1 Tablet By Mouth Once A Day 7)  Relion Ultima Test  Strp (Glucose Blood) .... Test Twice A Day 8)  Lipitor 40 Mg Tabs (Atorvastatin Calcium) .... Take 1 Tablet By Mouth Once A Day 9)  Relion Ultra Thin Lancets 30g  Misc (Lancets) .... As Directed  Current Medications (verified): 1)  Metformin Hcl 500 Mg Tabs (Metformin Hcl) .... Take 1 Tab By Mouth Every 12 Hours 2)  Enalapril Maleate 20 Mg Tabs (Enalapril Maleate) .... Take 1 Tablet  By Mouth Once Daily 3)  Hydrochlorothiazide 25 Mg Tabs (Hydrochlorothiazide) .... Take 1 Tablet By Mouth Once A Day 4)  Aspirin 81 Mg Chew (Aspirin) .... Take 1 Tablet By Mouth Once A Day 5)  Norvasc 10 Mg Tabs (Amlodipine Besylate) .... Take 1 Tablet By Mouth Once A Day 6)  Omeprazole 20 Mg  Cpdr (Omeprazole) .... Take 1 Tablet By Mouth Once A Day 7)  Relion Ultima  Test  Strp (Glucose Blood) .... Test Twice A Day 8)  Lipitor 40 Mg Tabs (Atorvastatin Calcium) .... Take 1 Tablet By Mouth Once A Day 9)  Relion Ultra Thin Lancets 30g  Misc (Lancets) .... As Directed  Allergies: No Known Drug Allergies  Past History:  Past Medical History: Last updated: 04/14/2006 Diabetes mellitus, type II Hyperlipidemia Hypertension Cerebrovascular accident, hx of  Social History: Last updated: 09/09/2006 Retired Single Former Smoker Alcohol use-no  Risk Factors: Exercise: yes (07/13/2009)  Risk Factors: Smoking Status: quit (04/03/2010)  Social History: Reviewed history from 09/09/2006 and no changes required. Retired Single Former Smoker Alcohol use-no  Review of Systems  The patient denies fever, chest pain, dyspnea on exertion, peripheral edema, hemoptysis, abdominal pain, melena, hematochezia, hematuria, muscle weakness, and unusual weight change.    Physical Exam  General:  NAD Mouth:  MMM Neck:  supple.   Lungs:  Normal respiratory effort, chest expands symmetrically. Lungs are clear to auscultation, no crackles or wheezes. Heart:  Normal rate and regular rhythm. S1 and S2 normal without gallop, murmur, click, rub or other extra sounds. Abdomen:  soft, non-tender, normal bowel sounds, and no distention.   Extremities:  no edema Neurologic:  alert & oriented X3, cranial nerves II-XII intact, strength normal in all extremities, and sensation intact to light touch.    Diabetes Management Exam:    Foot Exam (with socks and/or  shoes not present):       Sensory-Monofilament:          Left foot: normal          Right foot: normal   Impression & Recommendations:  Problem # 1:  HYPERTENSION (ICD-401.9) At goal. Continue current regimen  Her updated medication list for this problem includes:    Enalapril Maleate 20 Mg Tabs (Enalapril maleate) .Marland Kitchen... Take 1 tablet  by mouth once daily    Hydrochlorothiazide 25 Mg Tabs  (Hydrochlorothiazide) .Marland Kitchen... Take 1 tablet by mouth once a day    Norvasc 10 Mg Tabs (Amlodipine besylate) .Marland Kitchen... Take 1 tablet by mouth once a day  Future Orders: T-CBC No Diff ([id number]) ... 04/04/2010  BP today: 109/60 Prior BP: 130/65 (07/13/2009)  Labs Reviewed: K+: 4.0 (04/06/2009) Creat: : 0.83 (04/06/2009)   Chol: 231 (04/06/2009)   HDL: 57 (04/06/2009)   LDL: 164 (04/06/2009)   TG: 49 (04/06/2009)  Problem # 2:  DIABETES MELLITUS, TYPE II (ICD-250.00) At goal. Continue current regimen.   Her updated medication list for this problem includes:    Metformin Hcl 500 Mg Tabs (Metformin hcl) .Marland Kitchen... Take 1 tab by mouth every 12 hours    Enalapril Maleate 20 Mg Tabs (Enalapril maleate) .Marland Kitchen... Take 1 tablet  by mouth once daily    Aspirin 81 Mg Chew (Aspirin) .Marland Kitchen... Take 1 tablet by mouth once a day  Orders: T-Hgb A1C (in-house) (13086VH) T- Capillary Blood Glucose ([id number])  Labs Reviewed: Creat: 0.83 (04/06/2009)    Reviewed HgBA1c results: 6.0 (04/03/2010)  6.1 (07/13/2009)  Problem # 3:  HYPERLIPIDEMIA (ICD-272.4) Recheck FLP and reassess.  Her updated medication list for this problem includes:    Lipitor 40 Mg Tabs (Atorvastatin calcium) .Marland Kitchen... Take 1 tablet by mouth once a day  Future Orders: T-CMP with Estimated GFR ([id number]) ... 04/04/2010 T-Lipid Profile [id number]) ... 04/04/2010  Labs Reviewed: SGOT: 18 (04/06/2009)   SGPT: 11 (04/06/2009)   HDL:57 (04/06/2009), 50 (08/07/2008)  LDL:164 (04/06/2009), 128 (08/07/2008)  Chol:231 (04/06/2009), 199 (08/07/2008)  Trig:49 (04/06/2009), 104 (08/07/2008)  Problem # 4:  PREVENTIVE HEALTH CARE (ICD-V70.0) Patient received flu shot today. Patient will schedule Mammogram and Diabetic Eye exam.  Complete Medication List: 1)  Metformin Hcl 500 Mg Tabs (Metformin hcl) .... Take 1 tab by mouth every 12 hours 2)  Enalapril Maleate 20 Mg Tabs (Enalapril maleate) .... Take 1 tablet  by mouth once daily 3)   Hydrochlorothiazide 25 Mg Tabs (Hydrochlorothiazide) .... Take 1 tablet by mouth once a day 4)  Aspirin 81 Mg Chew (Aspirin) .... Take 1 tablet by mouth once a day 5)  Norvasc 10 Mg Tabs (Amlodipine besylate) .... Take 1 tablet by mouth once a day 6)  Omeprazole 20 Mg Cpdr (Omeprazole) .... Take 1 tablet by mouth once a day 7)  Relion Ultima Test Strp (Glucose blood) .... Test twice a day 8)  Lipitor 40 Mg Tabs (Atorvastatin calcium) .... Take 1 tablet by mouth once a day 9)  Relion Ultra Thin Lancets 30g Misc (Lancets) .... As directed  Patient Instructions: 1)  Please schedule a follow-up appointment in 3 months. 2)  Schedule for Mammogram and Diabetic Eye exam. 3)  Continue taking all medication as directed. 4)  Return tommorrow fasting to get blood draw.  5)  You will be called with any abnormalities in the tests scheduled or performed today.  If you don't hear from [place] within a week from when the test was performed, you can assume  that your test was normal.  Process Orders Check Orders Results:     [facility]: ABN not required for this insurance Tests Sent for requisitioning (April 05, 2010 11:40 AM):     04/04/2010: [facility] -- T-CBC No Diff [[id number]] (signed)     04/04/2010: [facility] -- T-CMP with Estimated GFR [[id number]] (signed)     04/04/2010: [facility] -- T-Lipid Profile [phone] (signed)     Prevention & Chronic Care Immunizations   Influenza vaccine: Fluvax 3+  (04/03/2009)    Tetanus booster: Not documented    Pneumococcal vaccine: Historical  (07/20/2000)    H. zoster vaccine: Not documented  Colorectal Screening   Hemoccult: Positive  (06/06/2006)    Colonoscopy: Not documented  Other Screening   Pap smear: Not documented    Mammogram: ASSESSMENT: Negative - BI-RADS 1^MM DIGITAL SCREENING  (04/26/2009)    DXA bone density scan: Not documented   DXA bone density  action/deferral: Refused  (04/03/2009)   Smoking status: quit  (04/03/2010)  Diabetes Mellitus   HgbA1C: 6.0  (04/03/2010)    Eye exam: Not documented   Diabetic eye exam action/deferral: Ophthalmology referral  (04/03/2009)    Foot exam: yes  (04/03/2010)   Foot exam action/deferral: Do today   High risk foot: No  (04/03/2010)   Foot care education: Done  (04/03/2010)    Urine microalbumin/creatinine ratio: 5.9  (01/25/2008)   Urine microalbumin action/deferral: Ordered    Diabetes flowsheet reviewed?: Yes   Progress toward A1C goal: At goal  Lipids   Total Cholesterol: 231  (04/06/2009)   LDL: 164  (04/06/2009)   LDL Direct: Not documented   HDL: 57  (04/06/2009)   Triglycerides: 49  (04/06/2009)    SGOT (AST): 18  (04/06/2009)   SGPT (ALT): 11  (04/06/2009)   Alkaline phosphatase: 50  (04/06/2009)   Total bilirubin: 0.4  (04/06/2009)    Lipid flowsheet reviewed?: Yes   Progress toward LDL goal: Unchanged  Hypertension   Last Blood Pressure: 109 / 60  (04/03/2010)   Serum creatinine: 0.83  (04/06/2009)   Serum potassium 4.0  (04/06/2009)    Hypertension flowsheet reviewed?: Yes   Progress toward BP goal: At goal  Self-Management Support :   Personal Goals (by the next clinic visit) :     Personal A1C goal: 6  (04/03/2009)     Personal blood pressure goal: 130/80  (04/03/2009)     Personal LDL goal: 100  (04/03/2009)    Patient will work on the following items until the next clinic visit to reach self-care goals:     Medications and monitoring: take my medicines every day  (04/03/2010)     Eating: drink diet soda or water instead of juice or soda, eat more vegetables, eat baked foods instead of fried foods, eat fruit for snacks and desserts  (04/03/2010)     Activity: take a 30 minute walk every day  (07/13/2009)    Diabetes self-management support: Written self-care plan  (04/03/2010)   Diabetes care plan printed    Hypertension self-management support:  Written self-care plan  (04/03/2010)   Hypertension self-care plan printed.    Lipid self-management support: Written self-care plan  (04/03/2010)   Lipid self-care plan printed.   Nursing Instructions: Give Flu vaccine today Diabetic foot exam today    Laboratory Results   Blood Tests   Date/Time Received: April 03, 2010 4:44 PM Date/Time Reported: Alric Quan  April 03, 2010  4:44 PM   HGBA1C: 6.0%   (Normal Range: Non-Diabetic - 3-6%   Control Diabetic - 6-8%) CBG Random:: 92mg /dL     Appended Document: est-ck/fu/meds/cfb

## 2010-07-30 NOTE — Progress Notes (Signed)
Summary: med refill/gp  Phone Note Refill Request Message from:  Fax from Pharmacy on October 01, 2009 4:04 PM  Refills Requested: Medication #1:  REL-ULTIMA TEST STRIPS #50 test twice a day   Last Refilled: 08/10/2009  Medication #2:  METFORMIN HCL 500 MG TABS Take 1 tab by mouth every 12 hours   Last Refilled: 07/19/2009  Medication #3:  Rel-lancets 30 G Lancets   Last Refilled: 08/10/2009  Method Requested: Electronic Initial call taken by: Chinita Pester RN,  October 01, 2009 4:04 PM    New/Updated Medications: RELION ULTIMA TEST  STRP (GLUCOSE BLOOD) test twice a day RELION ULTRA THIN LANCETS 30G  MISC (LANCETS) as directed Prescriptions: RELION ULTIMA TEST  STRP (GLUCOSE BLOOD) test twice a day  #60 x 12   Entered and Authorized by:   Laren Everts MD   Signed by:   Laren Everts MD on 10/02/2009   Method used:   Electronically to        [facility] (retail)       [address]       [place], [place]  [address]       Ph: [phone]       Fax: [phone]   RxID:   [id number] RELION ULTRA THIN LANCETS 30G  MISC (LANCETS) as directed  #60 x 6   Entered and Authorized by:   Laren Everts MD   Signed by:   Laren Everts MD on 10/02/2009   Method used:   Electronically to        [facility] (retail)       [address]       [place], [place]  [address]       Ph: [phone]       Fax: [phone]   RxID:   [id number] METFORMIN HCL 500 MG TABS (METFORMIN HCL) Take 1 tab by mouth every 12 hours  #120 x 6   Entered and Authorized by:   Laren Everts MD   Signed by:   Laren Everts MD on 10/02/2009   Method used:   Electronically to        [facility] (retail)       [address]       [place], [place]  [address]       Ph: [phone]       Fax: [phone]   RxID:   [id number]

## 2010-07-30 NOTE — Progress Notes (Signed)
Summary: Refill/gh  Phone Note Refill Request Message from:  Fax from Pharmacy on March 01, 2010 11:39 AM  Refills Requested: Medication #1:  NORVASC 10 MG TABS Take 1 tablet by mouth once a day   Last Refilled: 02/01/2010  Method Requested: Electronic Initial call taken by: Angelina Ok RN,  March 01, 2010 11:39 AM    Prescriptions: NORVASC 10 MG TABS (AMLODIPINE BESYLATE) Take 1 tablet by mouth once a day  #30 x 6   Entered and Authorized by:   Laren Everts MD   Signed by:   Laren Everts MD on 03/04/2010   Method used:   Electronically to        [facility] (retail)       [address]       [place], [place]  [address]       Ph: [phone]       Fax: [phone]   RxID:   [id number]

## 2010-07-30 NOTE — Progress Notes (Signed)
Summary: med refilli/gp  Phone Note Refill Request Message from:  Fax from Pharmacy on Nov 05, 2009 4:02 PM  Refills Requested: Medication #1:  HYDROCHLOROTHIAZIDE 25 MG TABS Take 1 tablet by mouth once a day   Last Refilled: 07/28/2009 Last appt. 08/13/09.   Method Requested: Electronic Initial call taken by: Chinita Pester RN,  Nov 05, 2009 4:02 PM    Prescriptions: HYDROCHLOROTHIAZIDE 25 MG TABS (HYDROCHLOROTHIAZIDE) Take 1 tablet by mouth once a day  #90 x 6   Entered and Authorized by:   Laren Everts MD   Signed by:   Laren Everts MD on 11/05/2009   Method used:   Electronically to        [facility] (retail)       [address]       [place], [place]  [address]       Ph: [phone]       Fax: [phone]   RxID:   [id number]

## 2010-07-30 NOTE — Progress Notes (Signed)
Summary: med refill/gp  Phone Note Refill Request Message from:  Fax from Pharmacy on January 15, 2010 10:54 AM  Refills Requested: Medication #1:  LIPITOR 40 MG TABS Take 1 tablet by mouth once a day   Last Refilled: 11/29/2009 Last appt.  Jan 14.   Method Requested: Electronic Initial call taken by: Chinita Pester RN,  January 15, 2010 10:54 AM    Prescriptions: LIPITOR 40 MG TABS (ATORVASTATIN CALCIUM) Take 1 tablet by mouth once a day  #30 x 6   Entered and Authorized by:   Laren Everts MD   Signed by:   Laren Everts MD on 01/15/2010   Method used:   Electronically to        [facility] (retail)       [address]       [place], [place]  [address]       Ph: [phone]       Fax: [phone]   RxID:   [id number]

## 2010-07-30 NOTE — Progress Notes (Signed)
Summary: refill/gg  Phone Note Refill Request  on May 07, 2010 4:08 PM  Refills Requested: Medication #1:  ENALAPRIL MALEATE 20 MG TABS Take 1 tablet  by mouth once daily   Last Refilled: 04/04/2010  Method Requested: Electronic Initial call taken by: Merrie Roof RN,  May 07, 2010 4:08 PM    Prescriptions: ENALAPRIL MALEATE 20 MG TABS (ENALAPRIL MALEATE) Take 1 tablet  by mouth once daily  #30 x 3   Entered and Authorized by:   Laren Everts MD   Signed by:   Laren Everts MD on 05/08/2010   Method used:   Electronically to        [facility] (retail)       [address]       [place], [place]  [address]       Ph: [phone]       Fax: [phone]   RxID:   [id number]

## 2010-08-01 NOTE — Consult Note (Signed)
[facility]   Imported By: Margie Billet 06/14/2010 10:53:46  _____________________________________________________________________  External Attachment:    Type:   Image     Comment:   External Document  Appended Document: [facility] No specific mammographic evidence of malignancy. Next screening mammogram is recommended in one year.

## 2010-09-09 ENCOUNTER — Encounter: Payer: Self-pay | Admitting: Internal Medicine

## 2010-09-09 ENCOUNTER — Ambulatory Visit (INDEPENDENT_AMBULATORY_CARE_PROVIDER_SITE_OTHER): Payer: No Typology Code available for payment source | Admitting: Internal Medicine

## 2010-09-09 DIAGNOSIS — I10 Essential (primary) hypertension: Secondary | ICD-10-CM

## 2010-09-09 DIAGNOSIS — E119 Type 2 diabetes mellitus without complications: Secondary | ICD-10-CM

## 2010-09-09 DIAGNOSIS — E785 Hyperlipidemia, unspecified: Secondary | ICD-10-CM

## 2010-09-09 MED ORDER — METFORMIN HCL 500 MG PO TABS: 500.0000 mg | ORAL_TABLET | Freq: Every day | ORAL | Status: DC

## 2010-09-09 MED ORDER — PRAVASTATIN SODIUM 80 MG PO TABS: 80.0000 mg | ORAL_TABLET | Freq: Every evening | ORAL | Status: DC

## 2010-09-09 NOTE — Progress Notes (Signed)
  Subjective:    Patient ID: Gina Kelley, female    DOB: 13-Nov-1935, 75 y.o.   MRN: [id number]  HPI  75 yr old woman with  Past Medical History  Diagnosis Date  . Hyperlipidemia   . Hypertension   . Diabetes mellitus   . PUD (peptic ulcer disease)   . Stroke 1998   comes to the clinic for Diabetes management. Patient has no complains.  Reports that she can not afford Lipitor.  Review of Systems  All other systems reviewed and are negative.       Objective:   Physical Exam  General:  NAD Mouth:  MMM Neck:  supple.   Lungs:  Normal respiratory effort, chest expands symmetrically. Lungs are clear to auscultation, no crackles or wheezes. Heart:  Normal rate and regular rhythm. S1 and S2 normal without gallop, murmur, click, rub or other extra sounds. Abdomen:  soft, non-tender, normal bowel sounds, and no distention.   Extremities:  no edema Neurologic:  alert & oriented X3, cranial nerves II-XII intact, strength normal in all extremities, and sensation intact to light touch.      Assessment & Plan:

## 2010-09-09 NOTE — Assessment & Plan Note (Signed)
Uncontrolled but due to cost of medication changed lipitor to pravastatin 80 mg daily. Recheck lipid panel and cmet in 3 months.

## 2010-09-09 NOTE — Patient Instructions (Signed)
Make follow up appointment in 3 months. Remember to decrease dose of Metformin to 500 mg by mouth daily. Start taking Pravastatin 80 mg by mouth daily.

## 2010-09-09 NOTE — Assessment & Plan Note (Signed)
At goal. Continue current regimen. 

## 2010-09-09 NOTE — Assessment & Plan Note (Signed)
Controlled. HgA1c 5.1 today. Meter reviewed. Patient had a hypoglycemic episode of 38 about 2 months ago. Denied any symptoms. Although metformin usually does not cause hypoglycemia will decrease dose of Metformin to 500 mg daily. Will review meter on follow up.

## 2010-10-03 LAB — GLUCOSE, CAPILLARY: Glucose-Capillary: 105 mg/dL — ABNORMAL HIGH (ref 70–99)

## 2010-10-09 LAB — GLUCOSE, CAPILLARY: Glucose-Capillary: 101 mg/dL — ABNORMAL HIGH (ref 70–99)

## 2010-10-10 ENCOUNTER — Other Ambulatory Visit: Payer: Self-pay | Admitting: Internal Medicine

## 2010-10-15 LAB — GLUCOSE, CAPILLARY: Glucose-Capillary: 137 mg/dL — ABNORMAL HIGH (ref 70–99)

## 2010-11-07 ENCOUNTER — Other Ambulatory Visit: Payer: Self-pay | Admitting: *Deleted

## 2010-11-07 DIAGNOSIS — E119 Type 2 diabetes mellitus without complications: Secondary | ICD-10-CM

## 2010-11-07 MED ORDER — METFORMIN HCL 500 MG PO TABS: 500.0000 mg | ORAL_TABLET | Freq: Two times a day (BID) | ORAL | Status: DC

## 2010-11-15 NOTE — Assessment & Plan Note (Signed)
[facility]                         GASTROENTEROLOGY OFFICE NOTE   NAME:Gina Kelley, Gina Kelley                    MRN:          [id number]  DATE:09/29/2006                            DOB:          03/31/1936    REQUESTING PHYSICIAN:  C. Ulyess Mort, M.D.   REASON FOR CONSULTATION:  Heme-positive stools and anemia.   ASSESSMENT:  This is a 74 year old African-American woman who has no  particular GI symptoms, but was found to have Hemoccult positive stools  on home Hemoccult cards and an anemia.  She did have iron studies, which  showed a normal ferritin of 179, her MCV is 90, and her iron saturation  is low, but her TIBC is in the low normal range at 293.  The saturation  is 18%.  Source of anemia is not clear.  Could be chronic occult blood  loss anemia.  Hemoglobin is 10.6.   RECOMMENDATIONS AND PLAN:  1. Start with a colonoscopy.  Risks, benefits, and indications are      explained.  She understands and agrees to proceed.  The possibility      of bleeding from colon cancer, polyps, or other lesions is      discussed.  2. If that is unrevealing, given the heme-positive stools and the      anemia, an upper GI endoscopy should be considered and I would      likely schedule that.   HISTORY:  A 75 year old African-American woman was followed at the [facility].  She has been seen by Dr. Aundria Rud and the  residents.  Dr. Silvestre Mesi has seen her.  She was found to have the  Hemoccult positive stools.  She denies any melena, bright red blood per  rectum, or change in bowel habits.  There is no abdominal pain, weight  loss, or appetite change.  She has never had a colonoscopy.  She feels  well otherwise.  There is no family history of colon cancer.   PAST MEDICAL HISTORY:  1. Hypertension.  2. Diabetes mellitus type 2.   She denies any surgeries.   MEDICATIONS:  1. Metformin 500 mg daily.  2. Enalapril 40 mg daily.  3.  Hydrochlorothiazide 25 mg daily.  4. Aspirin 81 mg daily.  5. Norvasc 10 mg daily.  6. Pravastatin 40 mg daily.   DRUG ALLERGIES:  None known.   SOCIAL HISTORY:  She is semi-retired.  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  She is married, 1 son, 3 daughters.  Ninth grade  education.  Lives with her husband.  No alcohol, tobacco, or drugs.   FAMILY HISTORY:  Otherwise, positive of heart disease and diabetes in  her mother.   REVIEW OF SYSTEMS:  She denies any active complaints at this time.  Our  review is negative.  See medical history form for full review of  systems.   PHYSICAL EXAM:  Well-developed elderly black woman looking younger than  stated age.  Height is 5 feet 6 inches, weight 155 pounds, blood pressure 132/70,  pulse 64.  EYES:  Anicteric.  MOUTH:  Dentures upper and lower.  Free of oral or mucosal lesions  otherwise.  NECK:  Supple without thyromegaly or mass.  CHEST:  Clear.  Resonant.  HEART:  S1, S2.  No rubs, murmurs, or gallops are heard.  Regular rhythm  and rate.  ABDOMEN:  Soft and nontender without organomegaly or mass.  RECTAL:  Deferred.  LYMPHATICS:  No neck or supraclavicular nodes.  LOWER EXTREMITIES:  Free of edema.  SKIN:  Warm and dry.  No acute rash.  NEURO/PSYCH:  She is alert and oriented x3.   I have reviewed the medical records sent and labs sent form the [facility].   I appreciate the opportunity to care for this patient.     Iva Boop, MD,FACG  Electronically Signed    CEG/MedQ  DD: 09/29/2006  DT: 09/29/2006  Job #: [id number]   cc:   C. Ulyess Mort, M.D.

## 2011-01-15 ENCOUNTER — Other Ambulatory Visit: Payer: Self-pay | Admitting: Internal Medicine

## 2011-01-16 NOTE — Telephone Encounter (Signed)
may need app't.  does need Bmet.

## 2011-01-28 ENCOUNTER — Encounter: Payer: Self-pay | Admitting: Internal Medicine

## 2011-01-28 ENCOUNTER — Ambulatory Visit (INDEPENDENT_AMBULATORY_CARE_PROVIDER_SITE_OTHER): Payer: No Typology Code available for payment source | Admitting: Internal Medicine

## 2011-01-28 VITALS — BP 108/68 | HR 75 | Temp 97.6°F | Ht 68.0 in | Wt 149.1 lb

## 2011-01-28 DIAGNOSIS — E785 Hyperlipidemia, unspecified: Secondary | ICD-10-CM

## 2011-01-28 DIAGNOSIS — E119 Type 2 diabetes mellitus without complications: Secondary | ICD-10-CM

## 2011-01-28 DIAGNOSIS — I10 Essential (primary) hypertension: Secondary | ICD-10-CM

## 2011-01-28 MED ORDER — ENALAPRIL MALEATE 20 MG PO TABS: 20.0000 mg | ORAL_TABLET | Freq: Every day | ORAL | Status: DC

## 2011-01-28 MED ORDER — AMLODIPINE BESYLATE 10 MG PO TABS: 10.0000 mg | ORAL_TABLET | Freq: Every day | ORAL | Status: DC

## 2011-01-28 MED ORDER — HYDROCHLOROTHIAZIDE 25 MG PO TABS: 25.0000 mg | ORAL_TABLET | Freq: Every day | ORAL | Status: DC

## 2011-01-28 NOTE — Assessment & Plan Note (Signed)
Blood pressure today in clinic was 108/68 with a pulse of 75. Patient reports good compliance of medicines and no side effects. Her pressure is within goal for a diabetic. Her medicines were refilled at this visit.

## 2011-01-28 NOTE — Progress Notes (Signed)
  Subjective:    Patient ID: Gina Kelley, female    DOB: 1936/01/30, 75 y.o.   MRN: [id number]  HPI This is a 75 year old woman who comes to clinic for routine follow  #1 diabetes: Patient reports good compliance with metformin and last A1c of 5.1. She reports no hypoglycemic episodes.  #2 hypertension: Patient reports good compliance with antihypertensive therapy. No episodes of orthostasis or dizziness the  #3 hyperlipidemia: Patient's antihyperlipidemic medicine was changed at the last visit. The patient was unable to get a fasting lipid panel in the interim. She knows that she needs to do that.   Review of Systems     Objective:   Physical Exam  Constitutional: She is oriented to person, place, and time. She appears well-developed and well-nourished.  HENT:  Head: Normocephalic and atraumatic.  Eyes: EOM are normal. Pupils are equal, round, and reactive to light.  Neck: Normal range of motion.  Neurological: She is alert and oriented to person, place, and time.  Skin: Skin is warm and dry.  Psychiatric: She has a normal mood and affect.          Assessment & Plan:   No problem-specific assessment & plan notes found for this encounter.

## 2011-01-28 NOTE — Assessment & Plan Note (Signed)
Patient was changed from Lipitor to pravastatin at the last visit. She was unable to recheck her lipid panel and CMAC, but I have reordered those labs and the patient will have been drawn in the near future.

## 2011-01-28 NOTE — Assessment & Plan Note (Signed)
Controlled with a hemoglobin A1c of 6.1 today. Meter reviewed. Patient reports no hypo-glycemic episodes. Metformin dose was lowered last visit to 500 mg daily. If her repeat hemoglobin A1c in 3 months from now is elevated above 6.1, I would consider raising her metformin dose back up to 500 twice a day.  This patient has also been on a baby aspirin for 20 years, but recently stopped taking it 2 to a incident of lip swelling. I spoke with the patient about this episode, and I do believe it is unlikely that this incident was related to aspirin. The patient agreed that she would resume her baby aspirin therapy.  Hypertension well controlled  Hyperlipidemia: Will recheck  Patient is a nonsmoker

## 2011-01-29 ENCOUNTER — Other Ambulatory Visit: Payer: No Typology Code available for payment source

## 2011-01-29 LAB — COMPREHENSIVE METABOLIC PANEL
ALT: 9 U/L (ref 0–35)
Albumin: 3.8 g/dL (ref 3.5–5.2)
CO2: 27 mEq/L (ref 19–32)
Chloride: 105 mEq/L (ref 96–112)
Potassium: 3.7 mEq/L (ref 3.5–5.3)
Sodium: 143 mEq/L (ref 135–145)
Total Bilirubin: 0.3 mg/dL (ref 0.3–1.2)
Total Protein: 7.2 g/dL (ref 6.0–8.3)

## 2011-01-29 LAB — LIPID PANEL: LDL Cholesterol: 190 mg/dL — ABNORMAL HIGH (ref 0–99)

## 2011-06-03 ENCOUNTER — Encounter: Payer: Self-pay | Admitting: Internal Medicine

## 2011-06-03 ENCOUNTER — Ambulatory Visit (INDEPENDENT_AMBULATORY_CARE_PROVIDER_SITE_OTHER): Payer: Medicare Other | Admitting: Internal Medicine

## 2011-06-03 VITALS — BP 108/61 | HR 82 | Temp 97.7°F | Ht 65.75 in | Wt 154.4 lb

## 2011-06-03 DIAGNOSIS — E785 Hyperlipidemia, unspecified: Secondary | ICD-10-CM

## 2011-06-03 DIAGNOSIS — E119 Type 2 diabetes mellitus without complications: Secondary | ICD-10-CM

## 2011-06-03 DIAGNOSIS — Z23 Encounter for immunization: Secondary | ICD-10-CM

## 2011-06-03 DIAGNOSIS — I10 Essential (primary) hypertension: Secondary | ICD-10-CM

## 2011-06-03 LAB — POCT GLYCOSYLATED HEMOGLOBIN (HGB A1C): Hemoglobin A1C: 6.2

## 2011-06-03 MED ORDER — HYDROCHLOROTHIAZIDE 25 MG PO TABS: 25.0000 mg | ORAL_TABLET | Freq: Every day | ORAL | Status: DC

## 2011-06-03 MED ORDER — ROSUVASTATIN CALCIUM 10 MG PO TABS: 10.0000 mg | ORAL_TABLET | Freq: Every day | ORAL | Status: DC

## 2011-06-03 MED ORDER — AMLODIPINE BESYLATE 10 MG PO TABS: 10.0000 mg | ORAL_TABLET | Freq: Every day | ORAL | Status: DC

## 2011-06-03 MED ORDER — ENALAPRIL MALEATE 20 MG PO TABS: 20.0000 mg | ORAL_TABLET | Freq: Every day | ORAL | Status: DC

## 2011-06-03 MED ORDER — METFORMIN HCL 500 MG PO TABS: 500.0000 mg | ORAL_TABLET | Freq: Two times a day (BID) | ORAL | Status: DC

## 2011-06-03 NOTE — Assessment & Plan Note (Signed)
Excellent blood pressure today in clinic, patient continues to take triple antihypertensive therapy without any signs or symptoms medical side effects or orthostasis. We'll continue with her current medicines.

## 2011-06-03 NOTE — Progress Notes (Signed)
  Subjective:    Patient ID: Gina Kelley, female    DOB: April 11, 1936, 75 y.o.   MRN: [id number]  HPI 75 year old woman presents for routine followup. No complaints at this time, doing well on antihypertensives, metformin, and pravastatin. Patient had been switched from Crestor to pravastatin due to insurance reasons, but recently obtained new insurance.   Review of Systems     Objective:   Physical Exam Physical Exam GEN: NAD.  Alert and oriented x 3.  Pleasant, conversant, and cooperative to exam. RESP:  CTAB, no w/r/r CARDIOVASCULAR: RRR, S1, S2, no m/r/g ABDOMEN: soft, NT/ND, NABS EXT: warm and dry. No edema in b/l LE       Assessment & Plan:

## 2011-06-03 NOTE — Assessment & Plan Note (Signed)
Patient had been changed to pravastatin from Crestor because of insurance reasons, and her LDL was 193 in August. She did obtain new insurance recently, and we are hoping that she will be covered for Crestor. If her insurance does not cover it, I told her she can call in and we would change her to Lipitor 40 mg daily, which hopefully will be covered. Given her LDL goal of less than 100 and her current of 193, we should try to get her proper therapy that is more powerful than pravastatin. We will see her back in 3 months.

## 2011-06-03 NOTE — Assessment & Plan Note (Signed)
A1c of 6.2 today. Doing well on metformin 500 mg daily. Patient continuing to take baby aspirin, blood pressure well controlled, hyperlipidemia is a work in progress (see problem for full details), patient continues to not smoke. Patient is due for her eye exam him and says that she likes to call and facilitate making that appointment. Reminded her to have them fax [place] any results. - Continue current therapy

## 2011-06-12 ENCOUNTER — Other Ambulatory Visit: Payer: Self-pay | Admitting: Internal Medicine

## 2011-06-12 DIAGNOSIS — Z1231 Encounter for screening mammogram for malignant neoplasm of breast: Secondary | ICD-10-CM

## 2011-07-18 ENCOUNTER — Ambulatory Visit (HOSPITAL_COMMUNITY): Payer: Medicare Other

## 2011-07-29 ENCOUNTER — Telehealth: Payer: Self-pay | Admitting: Dietician

## 2011-08-05 NOTE — Telephone Encounter (Signed)
Unable to reach patient by phone today.

## 2011-08-07 NOTE — Telephone Encounter (Signed)
Spoke with patient:  Will go to eye doctor by the end of this month Son and grandson also have high cholesterol. Says she eats vegetables, baked and grilled meats, oatmeal daily. Does not eat bacon, ham. Takes fish oil 3 a days.  Is currently taking pravastatin -  2 pills  x 40 mg, says even tier 2 for the crestor is too expensive and made legs hurt. (pravastatin does not make her legs hurt)

## 2011-08-11 NOTE — Telephone Encounter (Signed)
Thanks for helping with this Lupita Leash.  We will keep her on pravastain then and recheck her lipids in the future.

## 2011-08-14 ENCOUNTER — Other Ambulatory Visit: Payer: Self-pay | Admitting: *Deleted

## 2011-08-14 ENCOUNTER — Ambulatory Visit (HOSPITAL_COMMUNITY)
Admission: RE | Admit: 2011-08-14 | Discharge: 2011-08-14 | Disposition: A | Discharge status: Home / Self Care | Payer: Medicare Other | Source: Ambulatory Visit | Attending: Emergency Medicine | Admitting: Emergency Medicine

## 2011-08-14 DIAGNOSIS — Z1231 Encounter for screening mammogram for malignant neoplasm of breast: Secondary | ICD-10-CM | POA: Insufficient documentation

## 2011-08-14 MED ORDER — GLUCOSE BLOOD VI STRP: ORAL_STRIP | Status: DC

## 2011-08-14 NOTE — Telephone Encounter (Signed)
Pt has Primary school teacher now.

## 2011-09-10 ENCOUNTER — Other Ambulatory Visit: Payer: Self-pay | Admitting: Internal Medicine

## 2011-10-20 ENCOUNTER — Other Ambulatory Visit: Payer: Self-pay | Admitting: *Deleted

## 2011-10-20 DIAGNOSIS — I10 Essential (primary) hypertension: Secondary | ICD-10-CM

## 2011-10-20 MED ORDER — HYDROCHLOROTHIAZIDE 25 MG PO TABS: 25.0000 mg | ORAL_TABLET | Freq: Every day | ORAL | Status: DC

## 2011-10-20 NOTE — Telephone Encounter (Signed)
Last labs in 01-2011.  No appointment pending.  Refilled for 1 month.  Please schedule visit.

## 2011-10-20 NOTE — Telephone Encounter (Signed)
Message sent to front desk for an appt. 

## 2011-11-18 ENCOUNTER — Encounter: Payer: Medicare Other | Admitting: Internal Medicine

## 2011-12-04 ENCOUNTER — Ambulatory Visit (INDEPENDENT_AMBULATORY_CARE_PROVIDER_SITE_OTHER): Payer: Medicare Other | Admitting: Internal Medicine

## 2011-12-04 ENCOUNTER — Encounter: Payer: Self-pay | Admitting: Internal Medicine

## 2011-12-04 VITALS — BP 111/68 | HR 73 | Temp 98.1°F | Ht 65.5 in | Wt 156.3 lb

## 2011-12-04 DIAGNOSIS — I10 Essential (primary) hypertension: Secondary | ICD-10-CM

## 2011-12-04 DIAGNOSIS — Z0000 Encounter for general adult medical examination without abnormal findings: Secondary | ICD-10-CM

## 2011-12-04 DIAGNOSIS — Z79899 Other long term (current) drug therapy: Secondary | ICD-10-CM

## 2011-12-04 DIAGNOSIS — K279 Peptic ulcer, site unspecified, unspecified as acute or chronic, without hemorrhage or perforation: Secondary | ICD-10-CM

## 2011-12-04 DIAGNOSIS — R011 Cardiac murmur, unspecified: Secondary | ICD-10-CM

## 2011-12-04 DIAGNOSIS — D649 Anemia, unspecified: Secondary | ICD-10-CM

## 2011-12-04 DIAGNOSIS — E119 Type 2 diabetes mellitus without complications: Secondary | ICD-10-CM

## 2011-12-04 DIAGNOSIS — E785 Hyperlipidemia, unspecified: Secondary | ICD-10-CM

## 2011-12-04 HISTORY — DX: Encounter for general adult medical examination without abnormal findings: Z00.00

## 2011-12-04 LAB — BASIC METABOLIC PANEL WITH GFR
BUN: 16 mg/dL (ref 6–23)
CO2: 28 mEq/L (ref 19–32)
Chloride: 103 mEq/L (ref 96–112)
Creat: 0.81 mg/dL (ref 0.50–1.10)
Glucose, Bld: 122 mg/dL — ABNORMAL HIGH (ref 70–99)
Potassium: 3.5 mEq/L (ref 3.5–5.3)

## 2011-12-04 LAB — LIPID PANEL
Cholesterol: 251 mg/dL — ABNORMAL HIGH (ref 0–200)
Triglycerides: 71 mg/dL (ref <150)
VLDL: 14 mg/dL (ref 0–40)

## 2011-12-04 LAB — GLUCOSE, CAPILLARY: Glucose-Capillary: 117 mg/dL — ABNORMAL HIGH (ref 70–99)

## 2011-12-04 MED ORDER — ENALAPRIL MALEATE 20 MG PO TABS: 20.0000 mg | ORAL_TABLET | Freq: Every day | ORAL | Status: DC

## 2011-12-04 MED ORDER — HYDROCHLOROTHIAZIDE 25 MG PO TABS: 25.0000 mg | ORAL_TABLET | Freq: Every day | ORAL | Status: DC

## 2011-12-04 MED ORDER — METFORMIN HCL 500 MG PO TABS: 500.0000 mg | ORAL_TABLET | Freq: Two times a day (BID) | ORAL | Status: DC

## 2011-12-04 NOTE — Assessment & Plan Note (Addendum)
-   Had mammogram in 08/2011 that was normal - Does not get Pap smears anymore - colonoscopy 2008 (no polyps, no cancer)

## 2011-12-04 NOTE — Assessment & Plan Note (Signed)
2/6 holosystolic murmur at the left lower sternal border her today. Not documented on previous exam. Patient has no cardiac complaints, nor history of any cardiac disease. Will not pursue at this time, but can continue to monitor.

## 2011-12-04 NOTE — Assessment & Plan Note (Signed)
Lab Results  Component Value Date   HGBA1C 6.4 12/04/2011   HGBA1C 6.2 06/03/2011   HGBA1C 6.1 01/28/2011   Lab Results  Component Value Date   MICROALBUR 0.53 01/25/2008   LDLCALC 190* 01/29/2011   CREATININE 0.88 01/29/2011   A1c has been trending up slowly, as her weight has been increasing. Nonetheless, she is still well below local on metformin only. We will continue this therapy. - Continue metformin - Continue ASA - Patient says she will schedule eye exam - Check lipids today - Nonsmoker - Check BMP today - Check urine microalbumin to creatinine ratio next visit (patient urinated just prior to appointment)

## 2011-12-04 NOTE — Assessment & Plan Note (Signed)
No complaints at present.  No PPI at present.

## 2011-12-04 NOTE — Assessment & Plan Note (Addendum)
W/u in 2008 for Hb in the 10's: colonoscopy showed no cancer no polyps.  EGD showed small antral ulcer with erosion and gastritis.  Just got records of all this. - should check CBC at next visit

## 2011-12-04 NOTE — Progress Notes (Signed)
Subjective:     Patient ID: Gina Kelley, female   DOB: Nov 10, 1935, 76 y.o.   MRN: [id number]  HPI Patient is a very pleasant 76 year old woman with diabetes, hypertension, hyperlipidemia, who presents for followup.  Patient reports good compliance with all her medicines, has no side effects, no complaints today. Was recently in [place] on vacation.  Review of Systems     Objective:   Physical Exam GEN: NAD.  Alert and oriented x 3.  Pleasant, conversant, and cooperative to exam. RESP:  CTAB, no w/r/r CARDIOVASCULAR: RRR, S1, S2, 2/6 HSM at LLSB ABDOMEN: soft, NT/ND, NABS EXT: warm and dry. No edema in b/l LE     Assessment:         Plan:

## 2011-12-04 NOTE — Assessment & Plan Note (Signed)
Continued excellent BP today. Still on 3 antihypertensive medicines without any signs or symptoms of orthostasis. We will continue current medicines. - Continue amlodipine, HCTZ, enalapril

## 2011-12-05 ENCOUNTER — Other Ambulatory Visit: Payer: Self-pay | Admitting: Internal Medicine

## 2011-12-05 MED ORDER — SIMVASTATIN 20 MG PO TABS: 20.0000 mg | ORAL_TABLET | Freq: Every evening | ORAL | Status: DC

## 2011-12-05 MED ORDER — ATORVASTATIN CALCIUM 20 MG PO TABS: 20.0000 mg | ORAL_TABLET | Freq: Every day | ORAL | Status: DC

## 2011-12-09 NOTE — Progress Notes (Signed)
[facility]  [phone] called  12/05/11 about getting two Rx for chol med on pt. Talked with Dr Josem Kaufmann and stated two Rx were given to pt so pharmacy could run by insurance company - to see which would be the cheaper one for pt. [facility] aware. Stanton Kidney Cythia Bachtel RN 12/09/11 11:30AM

## 2011-12-10 ENCOUNTER — Encounter: Payer: Self-pay | Admitting: Internal Medicine

## 2011-12-10 NOTE — Assessment & Plan Note (Signed)
Post visit addendum: LDL still above goal.  Have called pt multiple times w/o success.  Wrote a letter instead.  Will change from pravachol to either simva or atorva, depending on which is cheaper.  Have sent in both to [facility] so they can run them.  Will need LDL recheck 6 wks after initiating therapy, all of which I outlined in the letter.

## 2011-12-10 NOTE — Addendum Note (Signed)
Addended by: Daryel Gerald on: 12/10/2011 09:09 AM   Modules accepted: Orders

## 2012-01-09 ENCOUNTER — Telehealth: Payer: Self-pay | Admitting: *Deleted

## 2012-01-09 ENCOUNTER — Other Ambulatory Visit (INDEPENDENT_AMBULATORY_CARE_PROVIDER_SITE_OTHER): Payer: Medicare Other

## 2012-01-09 DIAGNOSIS — E785 Hyperlipidemia, unspecified: Secondary | ICD-10-CM

## 2012-01-09 LAB — LIPID PANEL
Cholesterol: 217 mg/dL — ABNORMAL HIGH (ref 0–200)
Total CHOL/HDL Ratio: 4.6 Ratio

## 2012-01-09 NOTE — Telephone Encounter (Signed)
Pt came to clinic today to have lipid panel drawn.  She started on simvastatin 20 mg on June 7th and was asked to have labs drawn in six weeks to see how her LDL was doing.  Previous to this she was on Pravachol and LDL was not at goal.   Results of labs will show up tomorrow.

## 2012-01-12 ENCOUNTER — Other Ambulatory Visit: Payer: Self-pay | Admitting: Internal Medicine

## 2012-01-12 DIAGNOSIS — E785 Hyperlipidemia, unspecified: Secondary | ICD-10-CM

## 2012-01-12 MED ORDER — ATORVASTATIN CALCIUM 20 MG PO TABS: 20.0000 mg | ORAL_TABLET | Freq: Every day | ORAL | Status: DC

## 2012-01-12 NOTE — Progress Notes (Signed)
Lipid Panel on Simvastatin 20 mg PO QHS as follows:  Total Cholesterol 217 Triglycerides 46 HDL 46 LDL 161  Goal is LDL < 100 in this patient with diabetes.  As she is already on amlodipine the maximal dose of simvastatin is 20 mg PO QHS.  We will therefore switch to atorvostatin 20 mg PO QHS with goal of lowering LDL to < 100.

## 2012-01-13 NOTE — Progress Notes (Signed)
I called pt to advise about change in meds from simvastatin to  atorvostatin and pt stated she never took simvastatin. She has been taking atorvostatin since 12/05/11.   I called pharmacy to verify and they state pt has only received atorvastatin.  During pt's visit on 7/12 pt stated she was taking simvastatin, but I guess this is not so.   Do you want to continue atorvastatin until next visit as she just picked up refill.  Sorry about inaccurate information.  I did not check the bottle of medication, just went on what pt states.

## 2012-01-24 MED ORDER — ATORVASTATIN CALCIUM 40 MG PO TABS: 40.0000 mg | ORAL_TABLET | Freq: Every day | ORAL | Status: DC

## 2012-01-24 NOTE — Progress Notes (Signed)
Patient ID: Gina Kelley, female   DOB: October 17, 1935, 76 y.o.   MRN: [id number]  If the patient has been taking atorvastatin 20 mg PO QHS and is not a goal then we can increase the dose to 40 mg PO QHS.  Please call the patient and inform her of the dose change and the need to take 2 tablets each night until she gets her new prescription which will be atorvastatin 40 mg tablet.  Thanks

## 2012-02-19 ENCOUNTER — Other Ambulatory Visit: Payer: Self-pay | Admitting: *Deleted

## 2012-02-19 DIAGNOSIS — I10 Essential (primary) hypertension: Secondary | ICD-10-CM

## 2012-02-23 MED ORDER — AMLODIPINE BESYLATE 10 MG PO TABS: 10.0000 mg | ORAL_TABLET | Freq: Every day | ORAL | Status: DC

## 2012-03-24 ENCOUNTER — Other Ambulatory Visit: Payer: Self-pay | Admitting: *Deleted

## 2012-03-24 DIAGNOSIS — E119 Type 2 diabetes mellitus without complications: Secondary | ICD-10-CM

## 2012-03-24 MED ORDER — METFORMIN HCL 500 MG PO TABS: 500.0000 mg | ORAL_TABLET | Freq: Two times a day (BID) | ORAL | Status: DC

## 2012-05-14 ENCOUNTER — Ambulatory Visit (INDEPENDENT_AMBULATORY_CARE_PROVIDER_SITE_OTHER): Payer: Medicare Other | Admitting: Internal Medicine

## 2012-05-14 ENCOUNTER — Encounter: Payer: Self-pay | Admitting: Internal Medicine

## 2012-05-14 VITALS — BP 110/67 | HR 74 | Temp 98.2°F | Wt 153.0 lb

## 2012-05-14 DIAGNOSIS — I10 Essential (primary) hypertension: Secondary | ICD-10-CM

## 2012-05-14 DIAGNOSIS — E119 Type 2 diabetes mellitus without complications: Secondary | ICD-10-CM

## 2012-05-14 DIAGNOSIS — Z0000 Encounter for general adult medical examination without abnormal findings: Secondary | ICD-10-CM

## 2012-05-14 DIAGNOSIS — E785 Hyperlipidemia, unspecified: Secondary | ICD-10-CM

## 2012-05-14 DIAGNOSIS — I639 Cerebral infarction, unspecified: Secondary | ICD-10-CM

## 2012-05-14 DIAGNOSIS — Z23 Encounter for immunization: Secondary | ICD-10-CM

## 2012-05-14 DIAGNOSIS — D649 Anemia, unspecified: Secondary | ICD-10-CM

## 2012-05-14 DIAGNOSIS — I6350 Cerebral infarction due to unspecified occlusion or stenosis of unspecified cerebral artery: Secondary | ICD-10-CM

## 2012-05-14 DIAGNOSIS — Z79899 Other long term (current) drug therapy: Secondary | ICD-10-CM

## 2012-05-14 LAB — CBC
HCT: 32.4 % — ABNORMAL LOW (ref 36.0–46.0)
MCH: 29.1 pg (ref 26.0–34.0)
MCHC: 32.4 g/dL (ref 30.0–36.0)
MCV: 89.8 fL (ref 78.0–100.0)
Platelets: 252 10*3/uL (ref 150–400)
RDW: 13.8 % (ref 11.5–15.5)
WBC: 4.4 10*3/uL (ref 4.0–10.5)

## 2012-05-14 LAB — LIPID PANEL
Cholesterol: 212 mg/dL — ABNORMAL HIGH (ref 0–200)
HDL: 53 mg/dL (ref >39)
Total CHOL/HDL Ratio: 4 Ratio
Triglycerides: 53 mg/dL (ref <150)

## 2012-05-14 LAB — GLUCOSE, CAPILLARY: Glucose-Capillary: 148 mg/dL — ABNORMAL HIGH (ref 70–99)

## 2012-05-14 LAB — POCT GLYCOSYLATED HEMOGLOBIN (HGB A1C): Hemoglobin A1C: 6.4

## 2012-05-14 NOTE — Progress Notes (Signed)
  Subjective:    Patient ID: Gina Kelley, female    DOB: 03/02/1936, 76 y.o.   MRN: [id number]  HPI  Please see the A&P for the status of the pt's chronic medical problems.  Review of Systems  Constitutional: Negative for fever, activity change, appetite change, fatigue and unexpected weight change.  Cardiovascular: Negative for leg swelling.  Musculoskeletal: Negative for arthralgias.  Psychiatric/Behavioral: Negative for behavioral problems, confusion, dysphoric mood, decreased concentration and agitation. The patient is not nervous/anxious.       Objective:   Physical Exam  Nursing note and vitals reviewed. Constitutional: She is oriented to person, place, and time. She appears well-developed and well-nourished. No distress.  HENT:  Head: Normocephalic and atraumatic.  Eyes: Conjunctivae normal are normal. No scleral icterus.  Neck: Normal range of motion. Neck supple. No thyromegaly present.  Cardiovascular: Normal rate and regular rhythm.  Exam reveals no gallop and no friction rub.   Murmur heard.  Crescendo systolic murmur is present with a grade of 2/6       Very early systolic murmur at the RUSB  Pulmonary/Chest: Effort normal and breath sounds normal. No respiratory distress. She has no wheezes. She has no rales.  Abdominal: Soft. Bowel sounds are normal. She exhibits no distension. There is no tenderness. There is no rebound and no guarding.  Musculoskeletal: Normal range of motion. She exhibits no edema and no tenderness.  Lymphadenopathy:    She has no cervical adenopathy.  Neurological: She is alert and oriented to person, place, and time. She exhibits normal muscle tone.  Skin: Skin is warm and dry. No rash noted. She is not diaphoretic. No erythema. No pallor.  Psychiatric: She has a normal mood and affect. Her behavior is normal. Judgment and thought content normal.      Assessment & Plan:   Please see problem oriented charting.

## 2012-05-14 NOTE — Patient Instructions (Signed)
It was nice to meet you.  I look forward to caring for you for years to come.  1) Keep taking all of your medications just as you are.  You diabetes and blood pressure are wonderful.  2) You got your flu shot today.  3) We will see you back in 6 months to recheck your diabetes and do a foot examination.

## 2012-05-14 NOTE — Assessment & Plan Note (Signed)
She is tolerating the atorvastatin without any myalgias. A lipid panel was drawn this morning to assess if she is at target. The results are pending at this time. I will review them when they are back and make any necessary changes to her statin dose with a goal LDL being less than 100.

## 2012-05-14 NOTE — Assessment & Plan Note (Signed)
She was given her annual flu shot today.

## 2012-05-14 NOTE — Assessment & Plan Note (Signed)
Her diabetes has remained stable and very well controlled. Hemoglobin A1c today was 6.4 and is stable over the last 6 months. Of note, she is only requiring metformin 500 mg by mouth daily. We will continue the once daily low-dose metformin. We will reassess her diabetes at the followup visit.

## 2012-05-14 NOTE — Assessment & Plan Note (Signed)
She has had no neurologic symptoms in 15 years. We will continue the aspirin at 81 mg daily.

## 2012-05-14 NOTE — Assessment & Plan Note (Signed)
Her blood pressure today was under excellent control. It was 110/67. She has tolerated her amlodipine 10 mg daily, enalapril 20 mg daily, and hydrochlorothiazide 25 mg daily well. We will continue this regimen.

## 2012-05-21 MED ORDER — ATORVASTATIN CALCIUM 80 MG PO TABS: 80.0000 mg | ORAL_TABLET | Freq: Every day | ORAL | Status: DC

## 2012-05-21 NOTE — Addendum Note (Signed)
Addended by: Doneen Poisson D on: 05/21/2012 05:38 PM   Modules accepted: Orders

## 2012-05-21 NOTE — Progress Notes (Signed)
Patient ID: Gina Kelley, female   DOB: September 01, 1935, 76 y.o.   MRN: [id number]  Total cholesterol 212 Triglycerides 53 HDL 53 LDL 148  LDL above target for a diabetic patient.  I called Gina Kelley to inform her of the results.  We will increase the atorvastatin to 80 mg PO QHS and recheck her lipids at the follow-up visit.

## 2012-07-22 ENCOUNTER — Other Ambulatory Visit: Payer: Self-pay | Admitting: Internal Medicine

## 2012-07-22 DIAGNOSIS — Z1231 Encounter for screening mammogram for malignant neoplasm of breast: Secondary | ICD-10-CM

## 2012-07-23 ENCOUNTER — Other Ambulatory Visit: Payer: Self-pay | Admitting: Internal Medicine

## 2012-07-23 DIAGNOSIS — I10 Essential (primary) hypertension: Secondary | ICD-10-CM

## 2012-08-19 ENCOUNTER — Ambulatory Visit (HOSPITAL_COMMUNITY)
Admission: RE | Admit: 2012-08-19 | Discharge: 2012-08-19 | Disposition: A | Discharge status: Home / Self Care | Payer: Medicare Other | Source: Ambulatory Visit | Attending: Internal Medicine | Admitting: Internal Medicine

## 2012-08-19 DIAGNOSIS — Z1231 Encounter for screening mammogram for malignant neoplasm of breast: Secondary | ICD-10-CM | POA: Insufficient documentation

## 2012-09-09 ENCOUNTER — Other Ambulatory Visit: Payer: Self-pay | Admitting: Internal Medicine

## 2012-09-25 ENCOUNTER — Emergency Department (INDEPENDENT_AMBULATORY_CARE_PROVIDER_SITE_OTHER): Payer: Medicare Other

## 2012-09-25 ENCOUNTER — Encounter (HOSPITAL_COMMUNITY): Payer: Self-pay | Admitting: *Deleted

## 2012-09-25 ENCOUNTER — Emergency Department (HOSPITAL_COMMUNITY)
Admission: EM | Admit: 2012-09-25 | Discharge: 2012-09-25 | Disposition: A | Discharge status: Home / Self Care | Payer: Medicare Other | Source: Home / Self Care | Attending: Family Medicine | Admitting: Family Medicine

## 2012-09-25 DIAGNOSIS — S61409A Unspecified open wound of unspecified hand, initial encounter: Secondary | ICD-10-CM

## 2012-09-25 DIAGNOSIS — Z23 Encounter for immunization: Secondary | ICD-10-CM

## 2012-09-25 DIAGNOSIS — W540XXA Bitten by dog, initial encounter: Secondary | ICD-10-CM

## 2012-09-25 DIAGNOSIS — S61451A Open bite of right hand, initial encounter: Secondary | ICD-10-CM

## 2012-09-25 MED ORDER — AMOXICILLIN-POT CLAVULANATE 875-125 MG PO TABS: 1.0000 | ORAL_TABLET | Freq: Two times a day (BID) | ORAL | Status: DC

## 2012-09-25 MED ORDER — TETANUS-DIPHTH-ACELL PERTUSSIS 5-2.5-18.5 LF-MCG/0.5 IM SUSP
0.5000 mL | Freq: Once | INTRAMUSCULAR | Status: AC
  Administered 2012-09-25: 0.5 mL via INTRAMUSCULAR

## 2012-09-25 NOTE — ED Notes (Signed)
Bit byu neighbors pitbull 35 min. Prior to arrival.  Has multiple puncture wounds to R hand.  Bleeding stopped. States her middle finger was pushed over.  Has swelling and discoloration to MCP joint of R index finger.

## 2012-09-25 NOTE — ED Provider Notes (Signed)
History     CSN: [id number]  Arrival date & time 09/25/12  1607   First MD Initiated Contact with Patient 09/25/12 1626      Chief Complaint  Patient presents with  . Animal Bite    (Consider location/radiation/quality/duration/timing/severity/associated sxs/prior treatment) Patient is a 77 y.o. female presenting with hand injury. The history is provided by the patient.  Hand Injury Location:  Hand Time since incident:  2 hours Injury: yes   Mechanism of injury comment:  Bit by neighbors pit bull dog. Hand location:  Dorsum of R hand Pain details:    Quality:  Sharp   Severity:  Mild   Onset quality:  Sudden   Progression:  Improving Chronicity:  New Dislocation: no   Tetanus status:  Out of date Prior injury to area:  No   Past Medical History  Diagnosis Date  . Hyperlipidemia   . Hypertension   . Diabetes mellitus   . PUD (peptic ulcer disease)   . GERD (gastroesophageal reflux disease)     On occasion, peptobismol prn  . Heart murmur   . Stroke 1989    Right leg numbness, no residual    History reviewed. No pertinent past surgical history.  Family History  Problem Relation Age of Onset  . Stroke Mother   . Anuerysm Father 50    Cerebral  . Asthma Sister   . Pulmonary disease Brother     Black lung  . Stroke Sister   . Cirrhosis Brother   . Hypertension Son   . Asthma Brother     History  Substance Use Topics  . Smoking status: Former Smoker    Quit date: 06/30/1978  . Smokeless tobacco: Never Used  . Alcohol Use: No    OB History   Grav Para Term Preterm Abortions TAB SAB Ect Mult Living                  Review of Systems  Constitutional: Negative.   Musculoskeletal: Positive for joint swelling.  Skin: Positive for wound.    Allergies  Review of patient's allergies indicates no known allergies.  Home Medications   Current Outpatient Rx  Name  Route  Sig  Dispense  Refill  . amLODipine (NORVASC) 10 MG tablet   Oral   Take 1  tablet (10 mg total) by mouth daily.   30 tablet   11   . aspirin 81 MG chewable tablet   Oral   Chew 81 mg by mouth daily.           Marland Kitchen atorvastatin (LIPITOR) 80 MG tablet   Oral   Take 1 tablet (80 mg total) by mouth daily.   30 tablet   11   . enalapril (VASOTEC) 20 MG tablet   Oral   Take 1 tablet (20 mg total) by mouth daily.   30 tablet   11   . glucose blood (RELION ULTIMA TEST) test strip      Use to test blood sugar twice a day          . hydrochlorothiazide (HYDRODIURIL) 25 MG tablet   Oral   Take 1 tablet (25 mg total) by mouth daily.   30 tablet   11   . metFORMIN (GLUCOPHAGE) 500 MG tablet   Oral   Take 500 mg by mouth daily with breakfast.         . RELION ULTRA THIN LANCETS 30G MISC      as directed.           Marland Kitchen  amoxicillin-clavulanate (AUGMENTIN) 875-125 MG per tablet   Oral   Take 1 tablet by mouth 2 (two) times daily.   14 tablet   0     BP 145/56  Pulse 80  Temp(Src) 98.6 F (37 C) (Oral)  Resp 16  SpO2 98%  Physical Exam  Nursing note and vitals reviewed. Constitutional: She is oriented to person, place, and time. She appears well-developed and well-nourished.  Musculoskeletal: She exhibits tenderness.       Hands: Neurological: She is alert and oriented to person, place, and time.  Skin: Skin is warm and dry.    ED Course  Procedures (including critical care time)  Labs Reviewed - No data to display No results found.   1. Dog bite of hand without complication, right, initial encounter       MDM  X-rays reviewed and report per radiologist.         Linna Hoff, MD 09/25/12 1740

## 2012-12-09 ENCOUNTER — Other Ambulatory Visit: Payer: Self-pay | Admitting: Internal Medicine

## 2012-12-09 DIAGNOSIS — E119 Type 2 diabetes mellitus without complications: Secondary | ICD-10-CM

## 2013-01-06 ENCOUNTER — Other Ambulatory Visit: Payer: Self-pay

## 2013-01-28 ENCOUNTER — Other Ambulatory Visit: Payer: Self-pay | Admitting: Internal Medicine

## 2013-02-18 ENCOUNTER — Ambulatory Visit (INDEPENDENT_AMBULATORY_CARE_PROVIDER_SITE_OTHER): Payer: Medicare Other | Admitting: Internal Medicine

## 2013-02-18 ENCOUNTER — Encounter: Payer: Self-pay | Admitting: Internal Medicine

## 2013-02-18 VITALS — BP 109/60 | HR 62 | Temp 98.6°F | Wt 152.4 lb

## 2013-02-18 DIAGNOSIS — E119 Type 2 diabetes mellitus without complications: Secondary | ICD-10-CM

## 2013-02-18 DIAGNOSIS — K219 Gastro-esophageal reflux disease without esophagitis: Secondary | ICD-10-CM

## 2013-02-18 DIAGNOSIS — I10 Essential (primary) hypertension: Secondary | ICD-10-CM

## 2013-02-18 DIAGNOSIS — Z0000 Encounter for general adult medical examination without abnormal findings: Secondary | ICD-10-CM

## 2013-02-18 DIAGNOSIS — R05 Cough: Secondary | ICD-10-CM

## 2013-02-18 DIAGNOSIS — E785 Hyperlipidemia, unspecified: Secondary | ICD-10-CM

## 2013-02-18 DIAGNOSIS — R059 Cough, unspecified: Secondary | ICD-10-CM

## 2013-02-18 DIAGNOSIS — Z23 Encounter for immunization: Secondary | ICD-10-CM

## 2013-02-18 HISTORY — DX: Gastro-esophageal reflux disease without esophagitis: K21.9

## 2013-02-18 LAB — LIPID PANEL
Cholesterol: 220 mg/dL — ABNORMAL HIGH (ref 0–200)
HDL: 50 mg/dL (ref >39)
Total CHOL/HDL Ratio: 4.4 Ratio
VLDL: 12 mg/dL (ref 0–40)

## 2013-02-18 MED ORDER — ONETOUCH ULTRA MINI W/DEVICE KIT: PACK | Status: DC

## 2013-02-18 MED ORDER — METFORMIN HCL 500 MG PO TABS: 500.0000 mg | ORAL_TABLET | Freq: Every day | ORAL | Status: DC

## 2013-02-18 MED ORDER — ONETOUCH DELICA LANCETS FINE MISC: 1.0000 | Freq: Every day | Status: DC

## 2013-02-18 MED ORDER — GLUCOSE BLOOD VI STRP: ORAL_STRIP | Status: DC

## 2013-02-18 NOTE — Assessment & Plan Note (Signed)
She is tolerating the metoprolol 500 mg PO QD very well.  Her HgbA1C was at target today at 6.6.  A foot examination was completed and a lipid panel and microalbumin were obtained and are pending at the time of this note.  We will continue the metformin at the current dose and repeat a Hgb A1C at the follow-up visit.

## 2013-02-18 NOTE — Assessment & Plan Note (Signed)
She has tolerated the higher dose of atorvastatin well w/o myalgias.  A lipid panel was drawn this morning and the results are pending at the time of this note.  We will follow-up on the lipid panel and adjust her statin therapy as appropriate.

## 2013-02-18 NOTE — Patient Instructions (Signed)
It was great to see you again.  You are doing a very nice job with your health.  1) Keep taking all of your medications that you are prescribed except for the enalapril.  2) Stop the enalapril as it may be causing your cough.  The cough should go away after 1 month.  If it does not please give [place] a call.  3) Please get your flu shot in September/October.  I will see you back in 6 months, sooner if necessary.

## 2013-02-18 NOTE — Assessment & Plan Note (Signed)
She was given a pneumovax today and will think about the zostavax between visits.  If she is interested, we will provide it at the follow-up visit.

## 2013-02-18 NOTE — Progress Notes (Signed)
  Subjective:    Patient ID: Gina Kelley, female    DOB: 1936/01/22, 77 y.o.   MRN: [id number]  HPI  Please see the A&P for the status of the pt's chronic medical problems.  Review of Systems  Constitutional: Negative for fever, chills, diaphoresis, activity change, appetite change, fatigue and unexpected weight change.  Respiratory: Negative for shortness of breath.   Cardiovascular: Negative for chest pain and leg swelling.  Gastrointestinal: Negative for abdominal pain.  Musculoskeletal: Negative for myalgias, back pain, joint swelling, arthralgias and gait problem.  Skin: Negative for rash and wound.  Psychiatric/Behavioral: Negative for dysphoric mood. The patient is not nervous/anxious.       Objective:   Physical Exam  Nursing note and vitals reviewed. Constitutional: She is oriented to person, place, and time. She appears well-developed and well-nourished. No distress.  HENT:  Head: Normocephalic and atraumatic.  Eyes: Conjunctivae are normal. Right eye exhibits no discharge. Left eye exhibits no discharge. No scleral icterus.  Neck: Normal range of motion. Neck supple.  Cardiovascular: Normal rate and regular rhythm.  Exam reveals no gallop and no friction rub.   Murmur heard. Pulmonary/Chest: Effort normal and breath sounds normal. No respiratory distress. She has no wheezes. She has no rales.  Abdominal: Soft. She exhibits no distension. There is no tenderness. There is no rebound and no guarding.  Musculoskeletal: Normal range of motion. She exhibits no edema and no tenderness.  Neurological: She is alert and oriented to person, place, and time. She exhibits normal muscle tone.  Skin: Skin is warm and dry. No rash noted. She is not diaphoretic. No erythema. No pallor.  Psychiatric: She has a normal mood and affect. Her behavior is normal. Judgment and thought content normal.      Assessment & Plan:   Please see problem oriented charting.

## 2013-02-18 NOTE — Assessment & Plan Note (Signed)
Her blood pressure is very well controlled on her enalapril, HCTZ, and amlodipine.  She admits to a cough of three months duration of unclear etiology, but it is non-productive and significant enough to have drawn mention by her husband.  We will therefore hold the enalapril and reassess her cough and blood pressure at the follow-up visit.

## 2013-02-18 NOTE — Assessment & Plan Note (Signed)
She has had a non-productive cough for 3 months.  We decided to stop the enalapril and reassess.  If it persists we will consider the addition of a PPI given her h/o GERD and obtain a CXR to rule out an intrathoracic structural cause.

## 2013-02-19 LAB — MICROALBUMIN / CREATININE URINE RATIO: Creatinine, Urine: 177 mg/dL

## 2013-02-22 ENCOUNTER — Other Ambulatory Visit: Payer: Self-pay | Admitting: Internal Medicine

## 2013-02-22 MED ORDER — ATORVASTATIN CALCIUM 80 MG PO TABS: 80.0000 mg | ORAL_TABLET | Freq: Every day | ORAL | Status: DC

## 2013-02-22 NOTE — Addendum Note (Signed)
Addended by: Doneen Poisson D on: 02/22/2013 06:23 PM   Modules accepted: Orders

## 2013-02-22 NOTE — Progress Notes (Signed)
Urine microalbumin/creatinine ratio: 7.9  Total cholesterol 220 Triglycerides 58 HDL 50 LDL 158  LDL not at target.  Patient was prescribed atorvastatin 80 mg PO QHS in November 2013.  Her current bottle picked up on 01/31/2013 was for 40 mg tablets.  She has not been receiving the correct dose of atorvastatin despite the prescription being e-prescribed last November.  I again reordered the atorvastatin 80 mg PO QHS and asked Gina Kelley to take 2 of the 40 mg tablets each night and when she gets the new bottle to make sure she is receiving the 80 mg tablets as prescribed.  We will recheck the lipid panel at the follow-up visit.

## 2013-03-03 ENCOUNTER — Other Ambulatory Visit: Payer: Self-pay | Admitting: *Deleted

## 2013-03-03 DIAGNOSIS — E119 Type 2 diabetes mellitus without complications: Secondary | ICD-10-CM

## 2013-03-03 MED ORDER — GLUCOSE BLOOD VI STRP: ORAL_STRIP | Status: DC

## 2013-03-03 NOTE — Telephone Encounter (Signed)
Previous script did not go electronically to pharmacy

## 2013-03-13 ENCOUNTER — Other Ambulatory Visit: Payer: Self-pay | Admitting: Internal Medicine

## 2013-03-13 DIAGNOSIS — I10 Essential (primary) hypertension: Secondary | ICD-10-CM

## 2013-07-28 ENCOUNTER — Other Ambulatory Visit: Payer: Self-pay | Admitting: Internal Medicine

## 2013-07-28 DIAGNOSIS — Z1231 Encounter for screening mammogram for malignant neoplasm of breast: Secondary | ICD-10-CM

## 2013-08-11 ENCOUNTER — Other Ambulatory Visit: Payer: Self-pay | Admitting: Internal Medicine

## 2013-08-11 DIAGNOSIS — I10 Essential (primary) hypertension: Secondary | ICD-10-CM

## 2013-08-26 ENCOUNTER — Ambulatory Visit (HOSPITAL_COMMUNITY)
Admission: RE | Admit: 2013-08-26 | Discharge: 2013-08-26 | Disposition: A | Discharge status: Home / Self Care | Payer: Medicare Other | Source: Ambulatory Visit | Attending: Internal Medicine | Admitting: Internal Medicine

## 2013-08-26 DIAGNOSIS — Z1231 Encounter for screening mammogram for malignant neoplasm of breast: Secondary | ICD-10-CM

## 2013-09-02 ENCOUNTER — Ambulatory Visit (INDEPENDENT_AMBULATORY_CARE_PROVIDER_SITE_OTHER): Payer: Medicare Other | Admitting: Internal Medicine

## 2013-09-02 ENCOUNTER — Encounter: Payer: Self-pay | Admitting: Internal Medicine

## 2013-09-02 ENCOUNTER — Ambulatory Visit: Payer: Medicare Other | Admitting: Internal Medicine

## 2013-09-02 VITALS — BP 140/63 | HR 75 | Temp 97.0°F | Wt 150.6 lb

## 2013-09-02 DIAGNOSIS — Z0000 Encounter for general adult medical examination without abnormal findings: Secondary | ICD-10-CM

## 2013-09-02 DIAGNOSIS — R059 Cough, unspecified: Secondary | ICD-10-CM

## 2013-09-02 DIAGNOSIS — R05 Cough: Secondary | ICD-10-CM

## 2013-09-02 DIAGNOSIS — R053 Chronic cough: Secondary | ICD-10-CM

## 2013-09-02 DIAGNOSIS — I10 Essential (primary) hypertension: Secondary | ICD-10-CM

## 2013-09-02 DIAGNOSIS — E119 Type 2 diabetes mellitus without complications: Secondary | ICD-10-CM

## 2013-09-02 DIAGNOSIS — E785 Hyperlipidemia, unspecified: Secondary | ICD-10-CM

## 2013-09-02 LAB — POCT GLYCOSYLATED HEMOGLOBIN (HGB A1C): Hemoglobin A1C: 6.6

## 2013-09-02 LAB — GLUCOSE, CAPILLARY: GLUCOSE-CAPILLARY: 111 mg/dL — AB (ref 70–99)

## 2013-09-02 NOTE — Patient Instructions (Signed)
It was great to see you.  You are taking wonderful care of yourself.  1) Increase the atorvastatin to 80 mg by mouth each night.  I called [facility] to make sure they fixed the prescription.  2) Keep taking all of the other medications as you have been.  3) We discussed the bone density test and you decided to put it off at this time.  I support that decision.  4) We will talk about the shingles vaccination next time, but for now we will hold off given the expense.  I will see you back in 6 months, sooner if necessary.

## 2013-09-02 NOTE — Progress Notes (Signed)
   Subjective:    Patient ID: Gina Kelley, female    DOB: [birth year], 78 y.o.   MRN: [id number]  HPI  Please see the A&P for the status of the pt's chronic medical problems.  Review of Systems  Constitutional: Negative for fever, chills, diaphoresis, activity change, appetite change, fatigue and unexpected weight change.  HENT: Negative for congestion.   Respiratory: Negative for cough, chest tightness, shortness of breath and wheezing.   Cardiovascular: Negative for chest pain and leg swelling.  Gastrointestinal: Negative for nausea, vomiting, abdominal pain, diarrhea, constipation and abdominal distention.  Musculoskeletal: Negative for arthralgias and joint swelling.  Skin: Negative for rash and wound.  Neurological: Negative for dizziness, syncope, weakness and light-headedness.      Objective:   Physical Exam  Nursing note and vitals reviewed. Constitutional: She is oriented to person, place, and time. She appears well-developed and well-nourished. No distress.  HENT:  Head: Normocephalic and atraumatic.  Eyes: Conjunctivae are normal. Right eye exhibits no discharge. Left eye exhibits no discharge. No scleral icterus.  Cardiovascular: Normal rate, regular rhythm and normal heart sounds.  Exam reveals no gallop and no friction rub.   No murmur heard. Pulmonary/Chest: Effort normal and breath sounds normal. No respiratory distress. She has no wheezes. She has no rales.  Abdominal: Soft. Bowel sounds are normal. She exhibits no distension. There is no tenderness. There is no rebound and no guarding.  Musculoskeletal: Normal range of motion. She exhibits no edema and no tenderness.  Neurological: She is alert and oriented to person, place, and time. She exhibits normal muscle tone.  Skin: Skin is warm and dry. No rash noted. She is not diaphoretic. No erythema.  Psychiatric: She has a normal mood and affect. Her behavior is normal. Judgment and thought content normal.        Assessment & Plan:   Please see problem oriented charting.

## 2013-09-02 NOTE — Assessment & Plan Note (Signed)
Unfortunately, [facility] continued to provide her with 40 mg of atorvastatin rather than 80 as prescribed. I called [facility] and asked them to make sure that she receives the 80 mg dose of the atorvastatin moving forward. They saw she had the 80 mg prescription in the system but for some reason failed to dispense it to her. I counseled Gina Kelley on the need to take the atorvastatin 80 mg each night. We will recheck the lipid panel at the followup visit on the higher dose of the atorvastatin.

## 2013-09-02 NOTE — Assessment & Plan Note (Signed)
At the last visit she noted the onset of a chronic cough. Our first thought was that it may be associated with the ACE inhibitor she was taking. She stopped the ACE inhibitor and very quickly her chronic cough resolved. Therefore, it is likely her chronic cough was secondary to the ACE inhibitor. I included this in the medical record now as a intolerance so she's not accidentally represcribed an ACE inhibitor given her underlying diabetes.

## 2013-09-02 NOTE — Assessment & Plan Note (Signed)
Her blood pressure today was 140/63. This is at target. The regimen she is currently on is amlodipine 10 mg by mouth daily and hydrochlorothiazide 25 mg by mouth daily. The enalapril was stopped at the last visit secondary to a cough. We will continue the amlodipine at 10 mg by mouth daily and hydrochlorothiazide at 25 mg by mouth daily and check the blood pressure at the followup visit.

## 2013-09-02 NOTE — Assessment & Plan Note (Signed)
She states she got the flu vaccination in October at her local [facility]. She would like to defer the Zostavax at this time given the concern for possible cost. She's not interested in getting a bone density study.

## 2013-09-02 NOTE — Assessment & Plan Note (Signed)
Her hemoglobin A1c was 6.6 today which is well within target. This is on metformin 500 mg by mouth daily. She is tolerating this medication well. We will continue the metformin at 500 mg by mouth daily. She's up-to-date on all of her other diabetic health maintenance issues. We will recheck the hemoglobin A1c at the followup visit. Because of her consistently good control she no longer needs to monitor her blood sugars at home and she was informed of this.

## 2014-02-08 ENCOUNTER — Other Ambulatory Visit: Payer: Self-pay | Admitting: Internal Medicine

## 2014-02-18 ENCOUNTER — Other Ambulatory Visit: Payer: Self-pay | Admitting: Internal Medicine

## 2014-02-24 ENCOUNTER — Other Ambulatory Visit: Payer: Self-pay | Admitting: Internal Medicine

## 2014-03-24 ENCOUNTER — Other Ambulatory Visit: Payer: Self-pay | Admitting: Internal Medicine

## 2014-03-24 DIAGNOSIS — I10 Essential (primary) hypertension: Secondary | ICD-10-CM

## 2014-04-14 ENCOUNTER — Ambulatory Visit (INDEPENDENT_AMBULATORY_CARE_PROVIDER_SITE_OTHER): Payer: Medicare Other | Admitting: Internal Medicine

## 2014-04-14 ENCOUNTER — Encounter: Payer: Self-pay | Admitting: Internal Medicine

## 2014-04-14 VITALS — BP 112/61 | HR 60 | Temp 98.2°F | Ht 63.5 in | Wt 145.0 lb

## 2014-04-14 DIAGNOSIS — K219 Gastro-esophageal reflux disease without esophagitis: Secondary | ICD-10-CM

## 2014-04-14 DIAGNOSIS — Z0000 Encounter for general adult medical examination without abnormal findings: Secondary | ICD-10-CM

## 2014-04-14 DIAGNOSIS — I10 Essential (primary) hypertension: Secondary | ICD-10-CM

## 2014-04-14 DIAGNOSIS — E119 Type 2 diabetes mellitus without complications: Secondary | ICD-10-CM

## 2014-04-14 DIAGNOSIS — E785 Hyperlipidemia, unspecified: Secondary | ICD-10-CM

## 2014-04-14 LAB — LIPID PANEL
CHOL/HDL RATIO: 3.5 ratio
Cholesterol: 187 mg/dL (ref 0–200)
HDL: 53 mg/dL (ref >39)
LDL CALC: 126 mg/dL — AB (ref 0–99)
Triglycerides: 41 mg/dL (ref <150)
VLDL: 8 mg/dL (ref 0–40)

## 2014-04-14 LAB — POCT GLYCOSYLATED HEMOGLOBIN (HGB A1C): Hemoglobin A1C: 6.3

## 2014-04-14 LAB — GLUCOSE, CAPILLARY: Glucose-Capillary: 90 mg/dL (ref 70–99)

## 2014-04-14 MED ORDER — ATORVASTATIN CALCIUM 80 MG PO TABS
80.0000 mg | ORAL_TABLET | Freq: Every day | ORAL | Status: DC
Start: 2014-04-14 — End: 2015-08-03

## 2014-04-14 NOTE — Assessment & Plan Note (Signed)
She is tolerating the atorvastatin 80 mg by mouth daily without myalgias. A lipid panel is drawn at this visit and is pending at the time of this dictation. If her LDL is 40 or lower we will decrease the atorvastatin dose to 40 mg by mouth daily. Otherwise we will continue the high-dose high intensity statin therapy.

## 2014-04-14 NOTE — Assessment & Plan Note (Signed)
She denies any recent symptoms of gastroesophageal reflux disease. We will continue with the as needed Pepto-Bismol.

## 2014-04-14 NOTE — Patient Instructions (Signed)
It was great to see you again.  You are taking great care of yourself.  1) Keep taking your medications as you are.  2) I will see you back in 6 months, sooner if necessary.

## 2014-04-14 NOTE — Progress Notes (Signed)
   Subjective:    Patient ID: Gina Kelley, female    DOB: 1935-07-21, 78 y.o.   MRN: [id number]  HPI  Please see the A&P for the status of the pt's chronic medical problems.  Review of Systems  Constitutional: Negative for fever, activity change, appetite change, fatigue and unexpected weight change.  Eyes: Positive for visual disturbance.       Left eye cataract  Respiratory: Negative for cough, chest tightness, shortness of breath and wheezing.   Cardiovascular: Negative for chest pain, palpitations and leg swelling.  Gastrointestinal: Negative for nausea, vomiting, abdominal pain, diarrhea, constipation and abdominal distention.  Musculoskeletal: Negative for arthralgias, back pain, gait problem, joint swelling and myalgias.  Skin: Negative for color change, rash and wound.  Psychiatric/Behavioral: Negative for dysphoric mood and decreased concentration. The patient is not nervous/anxious.       Objective:   Physical Exam  Nursing note and vitals reviewed. Constitutional: She is oriented to person, place, and time. She appears well-developed and well-nourished. No distress.  HENT:  Head: Normocephalic and atraumatic.  Eyes: Conjunctivae are normal. Right eye exhibits no discharge. Left eye exhibits no discharge. No scleral icterus.  Cardiovascular: Normal rate, regular rhythm and normal heart sounds.  Exam reveals no gallop and no friction rub.   No murmur heard. Pulmonary/Chest: Effort normal and breath sounds normal. No respiratory distress. She has no wheezes. She has no rales.  Abdominal: Soft. Bowel sounds are normal. She exhibits no distension. There is no tenderness. There is no rebound and no guarding.  Musculoskeletal: Normal range of motion. She exhibits no edema and no tenderness.  Neurological: She is alert and oriented to person, place, and time. She exhibits normal muscle tone.  Skin: Skin is warm and dry. No rash noted. She is not diaphoretic. No erythema.    Psychiatric: She has a normal mood and affect. Her behavior is normal. Judgment and thought content normal.      Assessment & Plan:   Please see problem oriented charting.

## 2014-04-14 NOTE — Assessment & Plan Note (Signed)
She already received her flu vaccination. We are deferring Zostavax at this time secondary to cost.

## 2014-04-14 NOTE — Assessment & Plan Note (Signed)
Her diabetes is well-controlled on metformin 500 mg by mouth daily. Her hemoglobin A1c today is 6.3. We will continue the metformin at 500 mg by mouth daily. She had a diabetic foot exam done today. A lipid panel was drawn and is pending at the time of this dictation. She recently had an eye exam, having had a right cataract removed 3 months ago. She is scheduled to have the left cataract removed in November. We will try to get records of her ophthalmologic examination and intervention. She is otherwise up-to-date on her diabetic health care maintenance.

## 2014-04-14 NOTE — Assessment & Plan Note (Signed)
Her blood pressure is extremely well controlled today at 112/61. This is on amlodipine 10 mg by mouth daily and hydrochlorothiazide 25 mg by mouth daily which she is tolerating well. We will continue this regimen at the current doses and followup on her blood pressure control at the return visit.

## 2014-04-17 NOTE — Progress Notes (Signed)
Total cholesterol 187 Triglycerides 41 HDL 53 LDL 126  With this data her 10 year risk for a cardiovascular event is 27.2% although she is older than 75 and does not exactly fit into the guidelines.  That being said, with her diabetes and hypertension and the fact that she is tolerating atorvastatin 80 mg by mouth daily (high dose high intensity statin therapy), we will continue the atorvastatin at 80 mg daily.  I tried calling Gina Kelley with the news but I was sent to an unidentified voice mail.

## 2014-05-18 ENCOUNTER — Other Ambulatory Visit: Payer: Self-pay | Admitting: Internal Medicine

## 2014-05-18 DIAGNOSIS — E119 Type 2 diabetes mellitus without complications: Secondary | ICD-10-CM

## 2014-08-08 ENCOUNTER — Other Ambulatory Visit: Payer: Self-pay | Admitting: Internal Medicine

## 2014-08-08 DIAGNOSIS — Z1231 Encounter for screening mammogram for malignant neoplasm of breast: Secondary | ICD-10-CM

## 2014-08-10 ENCOUNTER — Other Ambulatory Visit: Payer: Self-pay | Admitting: *Deleted

## 2014-08-10 DIAGNOSIS — I10 Essential (primary) hypertension: Secondary | ICD-10-CM

## 2014-08-11 MED ORDER — HYDROCHLOROTHIAZIDE 25 MG PO TABS: 25.0000 mg | ORAL_TABLET | Freq: Every day | ORAL | Status: DC

## 2014-08-12 LAB — HM DIABETES EYE EXAM

## 2014-08-17 ENCOUNTER — Other Ambulatory Visit: Payer: Self-pay | Admitting: Internal Medicine

## 2014-08-17 ENCOUNTER — Ambulatory Visit (HOSPITAL_COMMUNITY)
Admission: RE | Admit: 2014-08-17 | Discharge: 2014-08-17 | Disposition: A | Discharge status: Home / Self Care | Payer: Medicare Other | Source: Ambulatory Visit | Attending: Internal Medicine | Admitting: Internal Medicine

## 2014-08-17 DIAGNOSIS — Z1231 Encounter for screening mammogram for malignant neoplasm of breast: Secondary | ICD-10-CM

## 2014-08-31 ENCOUNTER — Ambulatory Visit (HOSPITAL_COMMUNITY)
Admission: RE | Admit: 2014-08-31 | Discharge: 2014-08-31 | Disposition: A | Discharge status: Home / Self Care | Payer: Medicare Other | Source: Ambulatory Visit | Attending: Internal Medicine | Admitting: Internal Medicine

## 2014-08-31 DIAGNOSIS — Z1231 Encounter for screening mammogram for malignant neoplasm of breast: Secondary | ICD-10-CM | POA: Diagnosis not present

## 2014-12-08 ENCOUNTER — Encounter: Payer: Self-pay | Admitting: Internal Medicine

## 2015-02-23 ENCOUNTER — Ambulatory Visit: Payer: Medicare Other | Admitting: Internal Medicine

## 2015-03-08 ENCOUNTER — Encounter: Payer: Self-pay | Admitting: Internal Medicine

## 2015-03-08 ENCOUNTER — Ambulatory Visit (INDEPENDENT_AMBULATORY_CARE_PROVIDER_SITE_OTHER): Payer: Medicare Other | Admitting: Internal Medicine

## 2015-03-08 VITALS — BP 126/58 | HR 77 | Temp 98.5°F | Wt 143.3 lb

## 2015-03-08 DIAGNOSIS — E785 Hyperlipidemia, unspecified: Secondary | ICD-10-CM

## 2015-03-08 DIAGNOSIS — Z0000 Encounter for general adult medical examination without abnormal findings: Secondary | ICD-10-CM

## 2015-03-08 DIAGNOSIS — I10 Essential (primary) hypertension: Secondary | ICD-10-CM | POA: Diagnosis not present

## 2015-03-08 DIAGNOSIS — E119 Type 2 diabetes mellitus without complications: Secondary | ICD-10-CM

## 2015-03-08 DIAGNOSIS — Z79899 Other long term (current) drug therapy: Secondary | ICD-10-CM | POA: Diagnosis not present

## 2015-03-08 DIAGNOSIS — Z23 Encounter for immunization: Secondary | ICD-10-CM | POA: Diagnosis not present

## 2015-03-08 LAB — GLUCOSE, CAPILLARY: GLUCOSE-CAPILLARY: 98 mg/dL (ref 65–99)

## 2015-03-08 LAB — POCT GLYCOSYLATED HEMOGLOBIN (HGB A1C): Hemoglobin A1C: 6.1

## 2015-03-08 MED ORDER — ZOSTER VACCINE LIVE 19400 UNT/0.65ML ~~LOC~~ SOLR: 0.6500 mL | Freq: Once | SUBCUTANEOUS | Status: DC

## 2015-03-08 NOTE — Assessment & Plan Note (Signed)
She continues to tolerate atorvastatin 80 mg by mouth daily without myalgias. We will continue this high intensity statin. Given the American Heart Association guidelines of not checking periodic lipid profiles we did not order a lipid panel.

## 2015-03-08 NOTE — Assessment & Plan Note (Signed)
Her diabetes continues to be very well controlled. Her hemoglobin A1c this morning was 6.1. This is while on metformin 500 mg by mouth every morning. In the past I tried to convince her to stop the metformin but she was uninterested. We will therefore continue the metformin at 500 mg by mouth daily. If her hemoglobin A1c were to drop below 6 I will again strongly encourage her to stop the metformin. A urine microalbumin was obtained and is pending at the time of this dictation. A foot exam was also performed this morning. She is otherwise up-to-date on her diabetic health care maintenance. We will reassess her diabetic control at the follow-up visit with a repeat hemoglobin A1c.

## 2015-03-08 NOTE — Patient Instructions (Signed)
It was great to see you again.  You are doing a real nice job with your health!  1) Keep taking the medications as you are.  2) We gave you the flu shot today.  3) We gave you a prescription for the shingles shot today.  4) I put an order in for a bone scan to check the strength of your bones.  5) We got some blood and urine from you today.  I will call you next week if there is anything concerning.  I will see you back in 1 year.  Sooner if necessary.

## 2015-03-08 NOTE — Assessment & Plan Note (Signed)
She received the flu vaccination today and was given a written prescription for the Zostavax. A DEXA scan was also ordered. We will try to get her ophthalmology records for documentation of a diabetic retinal exam within the last year. We know this was done as she had a left cataract extraction in November 2015. Finally, we will offer her a Pneumovax 13 at the follow-up visit.

## 2015-03-08 NOTE — Assessment & Plan Note (Signed)
Her blood pressure this morning was excellent at 126/58. This is on amlodipine 10 mg by mouth daily and hydrochlorothiazide 25 mg by mouth daily. We will continue the amlodipine and hydrochlorothiazide at the current doses and reassess her hypertensive control at the follow-up visit. A basic metabolic panel was obtained to check the potassium and renal function and is pending at the time of this dictation.

## 2015-03-08 NOTE — Progress Notes (Signed)
   Subjective:    Patient ID: Gina Kelley, female    DOB: Jun 04, 1936, 78 y.o.   MRN: [id number]  HPI  Gina Kelley is here for follow-up of her diabetes and hypertension. Please see the A&P for the status of the pt's chronic medical problems.  Review of Systems  Constitutional: Negative for activity change, appetite change, fatigue and unexpected weight change.  Respiratory: Negative for chest tightness, shortness of breath and wheezing.   Cardiovascular: Negative for chest pain, palpitations and leg swelling.  Gastrointestinal: Negative for nausea, vomiting, abdominal pain, diarrhea, constipation and abdominal distention.  Musculoskeletal: Negative for myalgias, back pain, joint swelling, arthralgias, gait problem, neck pain and neck stiffness.  Skin: Negative for rash and wound.  Neurological: Negative for dizziness, syncope, weakness and light-headedness.  Psychiatric/Behavioral: Negative for dysphoric mood.      Objective:   Physical Exam  Constitutional: She is oriented to person, place, and time. She appears well-developed and well-nourished. No distress.  HENT:  Head: Normocephalic and atraumatic.  Eyes: Conjunctivae are normal. Right eye exhibits no discharge. Left eye exhibits no discharge. No scleral icterus.  Cardiovascular: Normal rate, regular rhythm and normal heart sounds.  Exam reveals no gallop and no friction rub.   No murmur heard. Pulmonary/Chest: Effort normal and breath sounds normal. No respiratory distress. She has no wheezes. She has no rales.  Abdominal: Soft. Bowel sounds are normal. She exhibits no distension. There is no tenderness. There is no rebound and no guarding.  Musculoskeletal: Normal range of motion. She exhibits no edema or tenderness.  Neurological: She is alert and oriented to person, place, and time. She exhibits normal muscle tone.  Skin: Skin is warm and dry. No rash noted. She is not diaphoretic. No erythema.  Psychiatric: She  has a normal mood and affect. Her behavior is normal. Judgment and thought content normal.  Nursing note and vitals reviewed.     Assessment & Plan:   Please see problem oriented charting.

## 2015-03-09 LAB — BMP8+ANION GAP
Anion Gap: 18 mmol/L (ref 10.0–18.0)
BUN/Creatinine Ratio: 24 (ref 11–26)
BUN: 18 mg/dL (ref 8–27)
CO2: 25 mmol/L (ref 18–29)
Calcium: 9.4 mg/dL (ref 8.7–10.3)
Chloride: 97 mmol/L (ref 97–108)
Creatinine, Ser: 0.74 mg/dL (ref 0.57–1.00)
GFR calc Af Amer: 90 mL/min/{1.73_m2} (ref >59)
GFR calc non Af Amer: 78 mL/min/{1.73_m2} (ref >59)
GLUCOSE: 108 mg/dL — AB (ref 65–99)
Potassium: 3.5 mmol/L (ref 3.5–5.2)
Sodium: 140 mmol/L (ref 134–144)

## 2015-03-09 LAB — MICROALBUMIN / CREATININE URINE RATIO
CREATININE, UR: 109.2 mg/dL
MICROALB/CREAT RATIO: 76.7 mg/g creat — ABNORMAL HIGH (ref 0.0–30.0)
MICROALBUM., U, RANDOM: 83.8 ug/mL

## 2015-03-12 NOTE — Progress Notes (Signed)
BMP: K 3.5, BUN 18, Creatinine 0.74, eGFR 90  Urine creatinine 109.2, urine microalbumin 83.8, Urine microalbumin/creatinine ratio 76.7 (H)  Elevated urine microalbumin/creatinine ratio.  Will reassess at the follow-up visit and if still elevated will diagnosis with Type 2 diabetes complicated by persistent microalbuminuria and likely switch the amlodipine to and ARB (intolerant of ACEI = cough).

## 2015-04-12 ENCOUNTER — Other Ambulatory Visit: Payer: Self-pay | Admitting: Internal Medicine

## 2015-04-12 DIAGNOSIS — I10 Essential (primary) hypertension: Secondary | ICD-10-CM

## 2015-05-31 ENCOUNTER — Other Ambulatory Visit: Payer: Self-pay | Admitting: Internal Medicine

## 2015-08-02 ENCOUNTER — Other Ambulatory Visit: Payer: Self-pay

## 2015-08-02 DIAGNOSIS — Z1231 Encounter for screening mammogram for malignant neoplasm of breast: Secondary | ICD-10-CM

## 2015-08-03 ENCOUNTER — Other Ambulatory Visit: Payer: Self-pay | Admitting: Internal Medicine

## 2015-08-03 DIAGNOSIS — E785 Hyperlipidemia, unspecified: Secondary | ICD-10-CM

## 2015-09-02 ENCOUNTER — Other Ambulatory Visit: Payer: Self-pay | Admitting: Internal Medicine

## 2015-09-02 DIAGNOSIS — I10 Essential (primary) hypertension: Secondary | ICD-10-CM

## 2015-09-20 ENCOUNTER — Ambulatory Visit
Admission: RE | Admit: 2015-09-20 | Discharge: 2015-09-20 | Disposition: A | Discharge status: Home / Self Care | Payer: Commercial Managed Care - HMO | Source: Ambulatory Visit

## 2015-09-20 DIAGNOSIS — Z1231 Encounter for screening mammogram for malignant neoplasm of breast: Secondary | ICD-10-CM

## 2015-11-29 ENCOUNTER — Other Ambulatory Visit: Payer: Self-pay | Admitting: Internal Medicine

## 2015-11-29 DIAGNOSIS — E1122 Type 2 diabetes mellitus with diabetic chronic kidney disease: Secondary | ICD-10-CM

## 2015-11-29 DIAGNOSIS — N181 Chronic kidney disease, stage 1: Principal | ICD-10-CM

## 2016-03-21 ENCOUNTER — Other Ambulatory Visit: Payer: Self-pay | Admitting: Internal Medicine

## 2016-03-21 DIAGNOSIS — I10 Essential (primary) hypertension: Secondary | ICD-10-CM

## 2016-04-17 ENCOUNTER — Telehealth: Payer: Self-pay | Admitting: Internal Medicine

## 2016-04-17 NOTE — Telephone Encounter (Signed)
A. REMINDER CALL, NO ANSWER, NO VOICEMAIL °

## 2016-04-18 ENCOUNTER — Encounter: Payer: Commercial Managed Care - HMO | Admitting: Internal Medicine

## 2016-04-18 ENCOUNTER — Encounter: Payer: Self-pay | Admitting: Internal Medicine

## 2016-05-08 ENCOUNTER — Telehealth: Payer: Self-pay | Admitting: Internal Medicine

## 2016-05-08 NOTE — Telephone Encounter (Signed)
APT. REMINDER CALL, NO ANSWER, NO VOICEMAIL °

## 2016-05-09 ENCOUNTER — Ambulatory Visit (INDEPENDENT_AMBULATORY_CARE_PROVIDER_SITE_OTHER): Payer: Commercial Managed Care - HMO | Admitting: Internal Medicine

## 2016-05-09 ENCOUNTER — Encounter: Payer: Self-pay | Admitting: Internal Medicine

## 2016-05-09 VITALS — BP 133/66 | HR 71 | Temp 98.5°F | Wt 138.8 lb

## 2016-05-09 DIAGNOSIS — E7849 Other hyperlipidemia: Secondary | ICD-10-CM

## 2016-05-09 DIAGNOSIS — E876 Hypokalemia: Secondary | ICD-10-CM | POA: Diagnosis not present

## 2016-05-09 DIAGNOSIS — Z7984 Long term (current) use of oral hypoglycemic drugs: Secondary | ICD-10-CM | POA: Diagnosis not present

## 2016-05-09 DIAGNOSIS — N181 Chronic kidney disease, stage 1: Principal | ICD-10-CM

## 2016-05-09 DIAGNOSIS — E119 Type 2 diabetes mellitus without complications: Secondary | ICD-10-CM

## 2016-05-09 DIAGNOSIS — E785 Hyperlipidemia, unspecified: Secondary | ICD-10-CM

## 2016-05-09 DIAGNOSIS — D638 Anemia in other chronic diseases classified elsewhere: Secondary | ICD-10-CM | POA: Diagnosis not present

## 2016-05-09 DIAGNOSIS — Z0000 Encounter for general adult medical examination without abnormal findings: Secondary | ICD-10-CM

## 2016-05-09 DIAGNOSIS — T502X5A Adverse effect of carbonic-anhydrase inhibitors, benzothiadiazides and other diuretics, initial encounter: Secondary | ICD-10-CM

## 2016-05-09 DIAGNOSIS — Z87891 Personal history of nicotine dependence: Secondary | ICD-10-CM

## 2016-05-09 DIAGNOSIS — I10 Essential (primary) hypertension: Secondary | ICD-10-CM | POA: Diagnosis not present

## 2016-05-09 DIAGNOSIS — E1122 Type 2 diabetes mellitus with diabetic chronic kidney disease: Secondary | ICD-10-CM

## 2016-05-09 DIAGNOSIS — Z79899 Other long term (current) drug therapy: Secondary | ICD-10-CM

## 2016-05-09 LAB — POCT GLYCOSYLATED HEMOGLOBIN (HGB A1C): Hemoglobin A1C: 6.6

## 2016-05-09 LAB — GLUCOSE, CAPILLARY: GLUCOSE-CAPILLARY: 104 mg/dL — AB (ref 65–99)

## 2016-05-09 NOTE — Patient Instructions (Addendum)
It was good to see you again.  You are taking good care of your health!  1) Keep taking the medications as you are.  2)  We will make an appointment for your bone density examination.  3) We drew blood from you today.  I will call you early next week in the afternoon when you get home from work.  4) We will give you the pneumonia shot at the next visit per your request.  I will see you in 6 months, sooner if necessary.

## 2016-05-09 NOTE — Assessment & Plan Note (Signed)
Assessment  Her diabetes is well-controlled today with a hemoglobin A1c of 6.6. This is on metformin 500 mg by mouth every morning.  Plan  She was praised for her excellent control of her diabetes. We will continue the metformin at 500 mg by mouth daily. We will reassess her diabetic control at the follow-up visit with a repeat hemoglobin A1c.

## 2016-05-09 NOTE — Assessment & Plan Note (Signed)
Assessment  She is tolerating the atorvastatin at 80 mg by mouth each night well without myalgias.  Plan  We will continue this high intensity statin and reassess for evidence of intolerance is at the follow-up visit.

## 2016-05-09 NOTE — Assessment & Plan Note (Signed)
Assessment  She is without signs or symptoms suggestive of symptomatic anemia.  Plan  A CBC was obtained at this visit and is pending at the time of this dictation.

## 2016-05-09 NOTE — Progress Notes (Signed)
   Subjective:    Patient ID: Gina Kelley Meline, female    DOB: 1936-02-06, 80 y.o.   MRN: [id number]  HPI  Gina Kelley [place] is here for follow-up of her diabetes, hypertension, and hyperlipidemia. Please see the A&P for the status of the pt's chronic medical problems.  Review of Systems  Constitutional: Negative for activity change, appetite change and unexpected weight change.  Respiratory: Negative for cough, chest tightness, shortness of breath and wheezing.   Cardiovascular: Negative for chest pain, palpitations and leg swelling.  Gastrointestinal: Negative for abdominal pain, constipation, diarrhea, nausea and vomiting.  Musculoskeletal: Negative for arthralgias, back pain, joint swelling and myalgias.  Skin: Negative for color change, rash and wound.      Objective:   Physical Exam  Constitutional: She is oriented to person, place, and time. She appears well-developed and well-nourished. No distress.  HENT:  Head: Normocephalic and atraumatic.  Eyes: Conjunctivae are normal. Right eye exhibits no discharge. Left eye exhibits no discharge. No scleral icterus.  Cardiovascular: Normal rate, regular rhythm and normal heart sounds.  Exam reveals no gallop and no friction rub.   No murmur heard. Pulmonary/Chest: Effort normal and breath sounds normal. No respiratory distress. She has no wheezes. She has no rales.  Abdominal: Soft. Bowel sounds are normal. She exhibits no distension. There is no tenderness. There is no rebound and no guarding.  Musculoskeletal: Normal range of motion. She exhibits no edema, tenderness or deformity.  Neurological: She is alert and oriented to person, place, and time. She exhibits normal muscle tone.  Skin: Skin is warm and dry. No rash noted. She is not diaphoretic. No erythema. No pallor.  Psychiatric: She has a normal mood and affect. Her behavior is normal. Judgment and thought content normal.  Nursing note and vitals reviewed.     Assessment &  Plan:   Please see problem oriented charting.

## 2016-05-09 NOTE — Assessment & Plan Note (Signed)
Assessment  Her blood pressure is well controlled today at 133/66. This is on hydrochlorothiazide 25 mg by mouth every morning and amlodipine 10 mg by mouth every morning.  Plan  We will continue the hydrochlorothiazide at 25 mg by mouth every morning and amlodipine at 10 mg by mouth every morning. We will reassess the efficacy of this therapy at the follow-up visit with a repeat blood pressure. A basic metabolic panel and urine microalbumin were obtained at this visit and are pending at the time of this dictation.

## 2016-05-09 NOTE — Assessment & Plan Note (Signed)
She deferred the Pneumovax until the follow-up visit. She did not get the DEXA scan that was ordered last year and this will be scheduled. We will try to obtain the most recent records from her ophthalmologist. She would like to defer a Zostavax until next year as she did not get the one that was previously provided to her. She is otherwise up-to-date on her health care maintenance.

## 2016-05-10 LAB — BMP8+ANION GAP
Anion Gap: 15 mmol/L (ref 10.0–18.0)
BUN / CREAT RATIO: 20 (ref 12–28)
BUN: 18 mg/dL (ref 8–27)
CHLORIDE: 97 mmol/L (ref 96–106)
CO2: 30 mmol/L — AB (ref 18–29)
Calcium: 10.1 mg/dL (ref 8.7–10.3)
Creatinine, Ser: 0.92 mg/dL (ref 0.57–1.00)
GFR calc non Af Amer: 60 mL/min/{1.73_m2} (ref >59)
GFR, EST AFRICAN AMERICAN: 69 mL/min/{1.73_m2} (ref >59)
GLUCOSE: 92 mg/dL (ref 65–99)
Potassium: 3.3 mmol/L — ABNORMAL LOW (ref 3.5–5.2)
SODIUM: 142 mmol/L (ref 134–144)

## 2016-05-10 LAB — CBC
HEMATOCRIT: 30.9 % — AB (ref 34.0–46.6)
Hemoglobin: 10.4 g/dL — ABNORMAL LOW (ref 11.1–15.9)
MCH: 29.1 pg (ref 26.6–33.0)
MCHC: 33.7 g/dL (ref 31.5–35.7)
MCV: 87 fL (ref 79–97)
PLATELETS: 253 10*3/uL (ref 150–379)
RBC: 3.57 x10E6/uL — ABNORMAL LOW (ref 3.77–5.28)
RDW: 14.5 % (ref 12.3–15.4)
WBC: 5.3 10*3/uL (ref 3.4–10.8)

## 2016-05-12 NOTE — Addendum Note (Signed)
Addended by: Bufford SpikesFULCHER, Andrewjames Weirauch N on: 05/12/2016 08:48 AM   Modules accepted: Orders

## 2016-05-13 ENCOUNTER — Encounter: Payer: Self-pay | Admitting: *Deleted

## 2016-05-13 ENCOUNTER — Telehealth: Payer: Self-pay

## 2016-05-13 NOTE — Telephone Encounter (Signed)
Requesting lab result. Please call back. 

## 2016-05-14 DIAGNOSIS — T502X5A Adverse effect of carbonic-anhydrase inhibitors, benzothiadiazides and other diuretics, initial encounter: Secondary | ICD-10-CM

## 2016-05-14 DIAGNOSIS — E876 Hypokalemia: Secondary | ICD-10-CM | POA: Insufficient documentation

## 2016-05-14 MED ORDER — POTASSIUM CHLORIDE 20 MEQ PO PACK: 20.0000 meq | PACK | Freq: Every day | ORAL | 3 refills | Status: DC

## 2016-05-14 NOTE — Progress Notes (Signed)
Patient ID: Gina Kelley, female   DOB: 04-06-1936, 80 y.o.   MRN: [id number]  BMP: K 3.2, eGFR 69  Hypokalemia.  Will start KCl 20 mEq PO daily.  CBC: Hgb 10.4  Stable over last 4 years, likely secondary to anemia of chronic disease.  Patient called with results and agrees to plan.

## 2016-05-14 NOTE — Addendum Note (Signed)
Addended by: Doneen PoissonKLIMA, Hale Chalfin D on: 05/14/2016 06:19 PM   Modules accepted: Orders

## 2016-05-15 ENCOUNTER — Encounter: Payer: Self-pay | Admitting: Internal Medicine

## 2016-05-15 DIAGNOSIS — H35033 Hypertensive retinopathy, bilateral: Secondary | ICD-10-CM

## 2016-05-15 HISTORY — DX: Hypertensive retinopathy, bilateral: H35.033

## 2016-05-16 ENCOUNTER — Telehealth: Payer: Self-pay | Admitting: *Deleted

## 2016-05-16 DIAGNOSIS — E876 Hypokalemia: Secondary | ICD-10-CM

## 2016-05-16 DIAGNOSIS — T502X5A Adverse effect of carbonic-anhydrase inhibitors, benzothiadiazides and other diuretics, initial encounter: Principal | ICD-10-CM

## 2016-05-16 NOTE — Telephone Encounter (Signed)
Do they have a recommendation for an equivalent potassium that is covered?  Thanks

## 2016-05-16 NOTE — Telephone Encounter (Signed)
[facility] - Potassium chloride 20 meq powder is not covered by [employer] insurance - cost over $600 w/o insurance. Thanks

## 2016-05-19 MED ORDER — POTASSIUM CHLORIDE ER 20 MEQ PO TBCR: 20.0000 meq | EXTENDED_RELEASE_TABLET | Freq: Every morning | ORAL | 3 refills | Status: DC

## 2016-05-19 NOTE — Telephone Encounter (Signed)
[facility] pharmacy states potassium tablets will probably be covered.

## 2016-08-11 ENCOUNTER — Other Ambulatory Visit: Payer: Self-pay | Admitting: Internal Medicine

## 2016-08-11 DIAGNOSIS — Z1231 Encounter for screening mammogram for malignant neoplasm of breast: Secondary | ICD-10-CM

## 2016-08-11 DIAGNOSIS — E2839 Other primary ovarian failure: Secondary | ICD-10-CM

## 2016-08-25 NOTE — Progress Notes (Unsigned)
Pt scheduled for mammogram on 09/25/2016.  Pt needs a Dexa, order placed and pt will get both mammo and dexa on 03/29.[name], Gina Cassady2/26/20182:41 PM

## 2016-08-29 ENCOUNTER — Other Ambulatory Visit: Payer: Self-pay | Admitting: Internal Medicine

## 2016-08-29 DIAGNOSIS — I10 Essential (primary) hypertension: Secondary | ICD-10-CM

## 2016-09-18 ENCOUNTER — Other Ambulatory Visit: Payer: Self-pay | Admitting: Internal Medicine

## 2016-09-18 DIAGNOSIS — E785 Hyperlipidemia, unspecified: Secondary | ICD-10-CM

## 2016-09-25 ENCOUNTER — Ambulatory Visit
Admission: RE | Admit: 2016-09-25 | Discharge: 2016-09-25 | Disposition: A | Discharge status: Home / Self Care | Payer: Commercial Managed Care - HMO | Source: Ambulatory Visit | Attending: Internal Medicine | Admitting: Internal Medicine

## 2016-09-25 ENCOUNTER — Other Ambulatory Visit: Payer: Commercial Managed Care - HMO

## 2016-09-25 DIAGNOSIS — Z1231 Encounter for screening mammogram for malignant neoplasm of breast: Secondary | ICD-10-CM

## 2016-10-16 ENCOUNTER — Ambulatory Visit
Admission: RE | Admit: 2016-10-16 | Discharge: 2016-10-16 | Disposition: A | Discharge status: Home / Self Care | Payer: Commercial Managed Care - HMO | Source: Ambulatory Visit | Attending: Internal Medicine | Admitting: Internal Medicine

## 2016-10-16 DIAGNOSIS — M810 Age-related osteoporosis without current pathological fracture: Secondary | ICD-10-CM | POA: Diagnosis not present

## 2016-10-16 DIAGNOSIS — M85851 Other specified disorders of bone density and structure, right thigh: Secondary | ICD-10-CM

## 2016-10-16 DIAGNOSIS — E2839 Other primary ovarian failure: Secondary | ICD-10-CM

## 2016-10-16 DIAGNOSIS — Z780 Asymptomatic menopausal state: Secondary | ICD-10-CM | POA: Diagnosis not present

## 2016-10-16 HISTORY — DX: Other specified disorders of bone density and structure, right thigh: M85.851

## 2016-10-20 DIAGNOSIS — M85851 Other specified disorders of bone density and structure, right thigh: Secondary | ICD-10-CM | POA: Insufficient documentation

## 2016-10-20 HISTORY — DX: Other specified disorders of bone density and structure, right thigh: M85.851

## 2016-11-07 ENCOUNTER — Encounter (INDEPENDENT_AMBULATORY_CARE_PROVIDER_SITE_OTHER): Payer: Self-pay

## 2016-11-07 ENCOUNTER — Ambulatory Visit (INDEPENDENT_AMBULATORY_CARE_PROVIDER_SITE_OTHER): Payer: Medicare HMO | Admitting: Internal Medicine

## 2016-11-07 ENCOUNTER — Encounter: Payer: Self-pay | Admitting: Internal Medicine

## 2016-11-07 VITALS — BP 112/53 | HR 68 | Temp 98.2°F | Wt 136.8 lb

## 2016-11-07 DIAGNOSIS — N181 Chronic kidney disease, stage 1: Principal | ICD-10-CM

## 2016-11-07 DIAGNOSIS — M85851 Other specified disorders of bone density and structure, right thigh: Secondary | ICD-10-CM

## 2016-11-07 DIAGNOSIS — Z7984 Long term (current) use of oral hypoglycemic drugs: Secondary | ICD-10-CM

## 2016-11-07 DIAGNOSIS — E876 Hypokalemia: Secondary | ICD-10-CM

## 2016-11-07 DIAGNOSIS — T502X5A Adverse effect of carbonic-anhydrase inhibitors, benzothiadiazides and other diuretics, initial encounter: Secondary | ICD-10-CM | POA: Diagnosis not present

## 2016-11-07 DIAGNOSIS — Z87891 Personal history of nicotine dependence: Secondary | ICD-10-CM | POA: Diagnosis not present

## 2016-11-07 DIAGNOSIS — E7849 Other hyperlipidemia: Secondary | ICD-10-CM

## 2016-11-07 DIAGNOSIS — E785 Hyperlipidemia, unspecified: Secondary | ICD-10-CM | POA: Diagnosis not present

## 2016-11-07 DIAGNOSIS — E1122 Type 2 diabetes mellitus with diabetic chronic kidney disease: Secondary | ICD-10-CM | POA: Diagnosis not present

## 2016-11-07 DIAGNOSIS — Z79899 Other long term (current) drug therapy: Secondary | ICD-10-CM

## 2016-11-07 DIAGNOSIS — K219 Gastro-esophageal reflux disease without esophagitis: Secondary | ICD-10-CM | POA: Diagnosis not present

## 2016-11-07 DIAGNOSIS — E119 Type 2 diabetes mellitus without complications: Secondary | ICD-10-CM | POA: Diagnosis not present

## 2016-11-07 DIAGNOSIS — I10 Essential (primary) hypertension: Secondary | ICD-10-CM

## 2016-11-07 DIAGNOSIS — M8588 Other specified disorders of bone density and structure, other site: Secondary | ICD-10-CM

## 2016-11-07 DIAGNOSIS — Z0000 Encounter for general adult medical examination without abnormal findings: Secondary | ICD-10-CM

## 2016-11-07 LAB — POCT GLYCOSYLATED HEMOGLOBIN (HGB A1C): Hemoglobin A1C: 6.4

## 2016-11-07 LAB — GLUCOSE, CAPILLARY: Glucose-Capillary: 197 mg/dL — ABNORMAL HIGH (ref 65–99)

## 2016-11-07 NOTE — Assessment & Plan Note (Signed)
Assessment  She apparently has not been taking her potassium chloride as prescribed.  Plan  A basic metabolic panel was obtained today and is pending at the time of this dictation. If her potassium level is low we will restart the potassium chloride at 20 mEq by mouth daily.

## 2016-11-07 NOTE — Progress Notes (Signed)
   Subjective:    Patient ID: Gina Kelley, female    DOB: Oct 01, 1935, 81 y.o.   MRN: [id number]  HPI  Gina Kelley is here for follow-up of her diabetes, hypertension, hyperlipidemia, osteopenia, and gastroesophageal reflux disease. Please see the A&P for the status of the pt's chronic medical problems.  Review of Systems  Constitutional: Negative for activity change, appetite change and unexpected weight change.  Respiratory: Negative for chest tightness and shortness of breath.   Cardiovascular: Negative for chest pain, palpitations and leg swelling.  Gastrointestinal: Negative for abdominal pain, constipation, diarrhea, nausea and vomiting.  Musculoskeletal: Negative for joint swelling.      Objective:   Physical Exam  Constitutional: She is oriented to person, place, and time. She appears well-developed and well-nourished. No distress.  HENT:  Head: Normocephalic and atraumatic.  Eyes: Conjunctivae are normal. Right eye exhibits no discharge. Left eye exhibits no discharge. No scleral icterus.  Cardiovascular: Normal rate, regular rhythm and normal heart sounds.  Exam reveals no gallop and no friction rub.   No murmur heard. Pulmonary/Chest: Effort normal and breath sounds normal. No respiratory distress. She has no wheezes. She has no rales.  Abdominal: Soft. Bowel sounds are normal. She exhibits no distension. There is no tenderness. There is no rebound and no guarding.  Musculoskeletal: Normal range of motion. She exhibits no edema, tenderness or deformity.  Neurological: She is alert and oriented to person, place, and time. She exhibits normal muscle tone.  Skin: Skin is warm and dry. No rash noted. She is not diaphoretic. No erythema.  Psychiatric: She has a normal mood and affect. Her behavior is normal. Judgment and thought content normal.  Nursing note and vitals reviewed.     Assessment & Plan:   Please see problem oriented charting.

## 2016-11-07 NOTE — Assessment & Plan Note (Signed)
Assessment  She is tolerating the atorvastatin 80 mg by mouth daily well without myalgias.  Plan  We will continue with a high intensity statin and reassess for intolerances at the follow-up visit.

## 2016-11-07 NOTE — Assessment & Plan Note (Signed)
Assessment  She very rarely has symptoms of gastroesophageal reflux and is not interested in any therapy at this time.  Plan  We will reassess for recurrence of her symptomatic gastroesophageal reflux disease at the follow-up visit.

## 2016-11-07 NOTE — Assessment & Plan Note (Signed)
She is not interested in the Prevnar 13 vaccination today and would like to defer it to the next visit. She is otherwise up-to-date on her health care maintenance.

## 2016-11-07 NOTE — Assessment & Plan Note (Signed)
Assessment  Since the last visit she underwent a DEXA scan which demonstrated osteopenia of the right femoral neck. Using the fracture tool her risk for a major osteoporotic fracture is 8.3% and for a hip fracture is 2.7% in the next 10 years. We discussed the fact that her risk does not reach the standard cut offs for initiation of bisphosphonate therapy. She is not interested in calcium supplementation at this time, preferring to obtain her calcium through dairy products.  Plan  She was encouraged to increase her dairy intake to increase her calcium supplementation. She does not particularly care for milk, but is planning on eating some more ice cream. I believe this is sufficient although we will have to pay close attention to her diabetic control with this increased ice cream intake.

## 2016-11-07 NOTE — Assessment & Plan Note (Signed)
Assessment  Her diabetes is well-controlled today with a hemoglobin A1c of 6.4. This is on metformin 500 mg by mouth daily and lifestyle modifications.  Plan  She was praised for her excellent diabetes control with her diet, exercise, and compliance with the metformin 500 mg by mouth daily. We will continue with the lifestyle modifications and metformin 500 mg by mouth daily. We will reassess diabetic control with a repeat hemoglobin A1c at the return appointment. She will schedule a diabetic retinal exam with her eye physician and was encouraged to do so. We also obtained a urine for microalbumin today which is pending at the time of this dictation. Otherwise, she is up-to-date on her diabetic health care maintenance.

## 2016-11-07 NOTE — Assessment & Plan Note (Signed)
Assessment  Her blood pressure is well controlled today at 112/53. This is well within the target for her cardiovascular risk. She is currently taking amlodipine 10 mg by mouth daily and hydrochlorothiazide 25 mg by mouth daily. She is tolerating these medications well.  Plan  We will continue the amlodipine to 10 mg by mouth daily and hydrochlorothiazide at 25 mg by mouth daily. A basic metabolic panel was obtained today to check on the potassium level and it is pending at the time of this dictation. It should be noted, that she has not been taking her potassium chloride as prescribed. She was informed that if her potassium is low I will be re-prescribing the medication. We will reassess the blood pressure at the follow-up visit.

## 2016-11-07 NOTE — Patient Instructions (Signed)
It was great to see you again.  You are taking excellent care of yourself.  1) Eat more dairy products such as ice cream to help strengthen your bones.  2) Keep taking the medications as you are.  3) I checked a potassium level and will call you if it is low.  Eating bananas should help this.  4) I checked your urine to make sure your kidneys are working.  5) We can give you a pneumonia shot at the next visit.  6) Don't forget to get an eye exam.  I will see you in 6 months, sooner if necessary.

## 2016-11-08 LAB — BMP8+ANION GAP
ANION GAP: 14 mmol/L (ref 10.0–18.0)
BUN/Creatinine Ratio: 20 (ref 12–28)
BUN: 17 mg/dL (ref 8–27)
CO2: 28 mmol/L (ref 18–29)
Calcium: 9.3 mg/dL (ref 8.7–10.3)
Chloride: 99 mmol/L (ref 96–106)
Creatinine, Ser: 0.84 mg/dL (ref 0.57–1.00)
GFR, EST AFRICAN AMERICAN: 76 mL/min/{1.73_m2} (ref >59)
GFR, EST NON AFRICAN AMERICAN: 66 mL/min/{1.73_m2} (ref >59)
Glucose: 120 mg/dL — ABNORMAL HIGH (ref 65–99)
POTASSIUM: 3.2 mmol/L — AB (ref 3.5–5.2)
Sodium: 141 mmol/L (ref 134–144)

## 2016-11-08 LAB — MICROALBUMIN / CREATININE URINE RATIO
Creatinine, Urine: 136.1 mg/dL
Microalb/Creat Ratio: 64.3 mg/g creat — ABNORMAL HIGH (ref 0.0–30.0)
Microalbumin, Urine: 87.5 ug/mL

## 2016-11-10 MED ORDER — POTASSIUM CHLORIDE CRYS ER 20 MEQ PO TBCR: 20.0000 meq | EXTENDED_RELEASE_TABLET | Freq: Every day | ORAL | 3 refills | Status: DC

## 2016-11-10 NOTE — Addendum Note (Signed)
Addended by: Doneen PoissonKLIMA, Khamryn Calderone D on: 11/10/2016 05:28 PM   Modules accepted: Orders

## 2016-11-10 NOTE — Progress Notes (Signed)
Patient ID: Gina Kelley, female   DOB: 10/26/1935, 81 y.o.   MRN: [id number]  BMP: K 3.2, Cr 0.84, eGFR 76  Potassium remains low and this is likely secondary to kaluresis from the HCTZ.  I called [name] with the result and explained the importance of a normal potassium level.  She is agreeable to restarting the KCl 20 mEq daily.  This will be sent to her pharmacy for dispensing.  Microalbumin/Creatinine: 64.3  Microalbuminuria remains stable despite inability to tolerate ACEI.  Excellent BP and diabetes control remains important to prevent progression.

## 2016-11-18 ENCOUNTER — Encounter: Payer: Self-pay | Admitting: Internal Medicine

## 2016-11-30 ENCOUNTER — Other Ambulatory Visit: Payer: Self-pay | Admitting: Internal Medicine

## 2016-11-30 DIAGNOSIS — N181 Chronic kidney disease, stage 1: Principal | ICD-10-CM

## 2016-11-30 DIAGNOSIS — E1122 Type 2 diabetes mellitus with diabetic chronic kidney disease: Secondary | ICD-10-CM

## 2017-01-16 ENCOUNTER — Encounter: Payer: Self-pay | Admitting: Internal Medicine

## 2017-03-11 ENCOUNTER — Ambulatory Visit: Payer: Medicare HMO | Admitting: Internal Medicine

## 2017-03-26 ENCOUNTER — Other Ambulatory Visit: Payer: Self-pay | Admitting: Internal Medicine

## 2017-03-26 DIAGNOSIS — I10 Essential (primary) hypertension: Secondary | ICD-10-CM

## 2017-04-10 ENCOUNTER — Encounter: Payer: Self-pay | Admitting: Internal Medicine

## 2017-04-10 ENCOUNTER — Ambulatory Visit (INDEPENDENT_AMBULATORY_CARE_PROVIDER_SITE_OTHER): Payer: Medicare HMO | Admitting: Internal Medicine

## 2017-04-10 VITALS — BP 129/63 | HR 74 | Temp 98.4°F | Wt 132.4 lb

## 2017-04-10 DIAGNOSIS — K219 Gastro-esophageal reflux disease without esophagitis: Secondary | ICD-10-CM | POA: Diagnosis not present

## 2017-04-10 DIAGNOSIS — I129 Hypertensive chronic kidney disease with stage 1 through stage 4 chronic kidney disease, or unspecified chronic kidney disease: Secondary | ICD-10-CM

## 2017-04-10 DIAGNOSIS — T502X5A Adverse effect of carbonic-anhydrase inhibitors, benzothiadiazides and other diuretics, initial encounter: Secondary | ICD-10-CM | POA: Diagnosis not present

## 2017-04-10 DIAGNOSIS — E876 Hypokalemia: Secondary | ICD-10-CM

## 2017-04-10 DIAGNOSIS — H35033 Hypertensive retinopathy, bilateral: Secondary | ICD-10-CM

## 2017-04-10 DIAGNOSIS — E1122 Type 2 diabetes mellitus with diabetic chronic kidney disease: Secondary | ICD-10-CM

## 2017-04-10 DIAGNOSIS — Z0000 Encounter for general adult medical examination without abnormal findings: Secondary | ICD-10-CM

## 2017-04-10 DIAGNOSIS — E781 Pure hyperglyceridemia: Secondary | ICD-10-CM

## 2017-04-10 DIAGNOSIS — N181 Chronic kidney disease, stage 1: Secondary | ICD-10-CM

## 2017-04-10 DIAGNOSIS — I10 Essential (primary) hypertension: Secondary | ICD-10-CM

## 2017-04-10 DIAGNOSIS — M85851 Other specified disorders of bone density and structure, right thigh: Secondary | ICD-10-CM

## 2017-04-10 LAB — POCT GLYCOSYLATED HEMOGLOBIN (HGB A1C): HEMOGLOBIN A1C: 5.7

## 2017-04-10 LAB — GLUCOSE, CAPILLARY: GLUCOSE-CAPILLARY: 96 mg/dL (ref 65–99)

## 2017-04-10 NOTE — Assessment & Plan Note (Signed)
Assessment  She is tolerating the atorvastatin 80 mg by mouth daily without myalgias.  Plan  We will continue this high intensity statin and reassess for intolerances at the follow-up visit. 

## 2017-04-10 NOTE — Assessment & Plan Note (Signed)
Assessment  She very rarely has symptoms consistent with her gastroesophageal reflux disease. She is therefore not interested in any preventative therapy given the infrequency of the symptoms.  Plan  We will reassess the control of her gastroesophageal reflux symptoms with just lifestyle modification at the follow-up visit.

## 2017-04-10 NOTE — Assessment & Plan Note (Signed)
Assessment  She continues to manage her osteopenia with a diet that includes dairy to assure she gets her calcium and vitamin D.  Plan  We will reassess her maintenance of dairy within her diet at the follow-up visit.

## 2017-04-10 NOTE — Assessment & Plan Note (Signed)
Assessment  She has yet to see an ophthalmologist as recommended at the last visit.  Plan  She was again reminded to schedule an appointment with her ophthalmologist. We will reassess their progress notes at the follow-up visit. In the meantime we will continue aggressive management of her hypertension as noted.

## 2017-04-10 NOTE — Assessment & Plan Note (Signed)
Assessment  Her blood pressure is well controlled today at 129/63. This is on hydrochlorothiazide 25 mg by mouth daily and amlodipine 10 mg by mouth daily.  Plan  We will continue the amlodipine at 10 mg by mouth daily. We will discontinue the hydrochlorothiazide given the intolerance she has for the potassium chloride supplementation and the diuretic induced hypokalemia. We will reassess the efficacy of the amlodipine alone as an antihypertensive at the follow-up visit.

## 2017-04-10 NOTE — Patient Instructions (Signed)
It was good to see you again.  You are doing amazingly well and living very healthy.  I am very impressed.  1) Keep taking the amlodipine for your blood pressure, baby aspirin to help prevent a stroke, and your cholesterol medication.  2) Stop the hydrochlorothiazide and the metformin as I do not believe you need them.  I also think that the hydrochlorothiazide was causing your potassium to be low.  3) Don't for get to see the eye doctor.  4) Finish up with the colonoscopy as you have scheduled.  5) Keep active physically and mentally.  I will see you back in 3 months, sooner if necessary.

## 2017-04-10 NOTE — Progress Notes (Signed)
   Subjective:    Patient ID: Gina Kelley, female    DOB: 1936/06/24, 81 y.o.   MRN: [id number]  HPI  Gina Kelley is here for follow-up of her type II diabetes with stage I chronic kidney disease, essential hypertension, pure hyperlipidemia, diuretic-induced hypokalemia, osteopenia of right femoral neck, and gastroesophageal reflux disease. Please see the A&P for the status of the pt's chronic medical problems.  Review of Systems  Constitutional: Negative for activity change, appetite change and unexpected weight change.  Respiratory: Negative for cough, chest tightness and shortness of breath.   Cardiovascular: Negative for chest pain, palpitations and leg swelling.  Gastrointestinal: Negative for abdominal distention, abdominal pain, constipation, diarrhea, nausea and vomiting.  Genitourinary: Negative for difficulty urinating.  Musculoskeletal: Positive for arthralgias. Negative for back pain, gait problem, joint swelling, myalgias and neck pain.       Occasional right knee pain when the weather turns cold  Skin: Negative for rash and wound.      Objective:   Physical Exam  Constitutional: She is oriented to person, place, and time. She appears well-developed and well-nourished. No distress.  HENT:  Head: Normocephalic and atraumatic.  Eyes: Conjunctivae are normal. Right eye exhibits no discharge. Left eye exhibits no discharge. No scleral icterus.  Cardiovascular: Normal rate, regular rhythm and normal heart sounds.  Exam reveals no gallop and no friction rub.   No murmur heard. Pulmonary/Chest: Effort normal and breath sounds normal. No respiratory distress. She has no wheezes. She has no rales.  Abdominal: Soft. She exhibits no distension. There is no tenderness. There is no rebound and no guarding.  Musculoskeletal: Normal range of motion. She exhibits no edema, tenderness or deformity.  Neurological: She is alert and oriented to person, place, and time. She exhibits  normal muscle tone.  Skin: Skin is warm and dry. No rash noted. She is not diaphoretic. No erythema.  Psychiatric: She has a normal mood and affect. Her behavior is normal. Judgment and thought content normal.  Nursing note and vitals reviewed.     Assessment & Plan:   Please see problem based charting.

## 2017-04-10 NOTE — Assessment & Plan Note (Signed)
She received her flu vaccination approximately 2 weeks ago. She was reminded to schedule her ophthalmology appointment. At the follow-up visit we will provide her with Prevnar 13 to complete the Pneumovax series. She is otherwise up-to-date on her health care maintenance

## 2017-04-10 NOTE — Assessment & Plan Note (Signed)
Assessment  She was unable to tolerate the potassium supplementation and therefore has not been taking it.  Plan  Given the excellent control of her blood pressure we will stop the hydrochlorothiazide and reassess her blood pressure as well as potassium levels at the follow-up visit.

## 2017-04-10 NOTE — Assessment & Plan Note (Signed)
Assessment  Her diabetes is excessively well-controlled today with a hemoglobin A1c of 5.7. This is on metformin 500 mg by mouth daily. She denies any symptoms consistent with periods of hypoglycemia. Nonetheless, this degree of control is ill advised given the risks. With regards to her stage I kidney disease, her blood pressure is very well controlled on her current regimen.  Plan  We will discontinue the metformin and reassess her glycemic control at the follow-up visit with a repeat hemoglobin A1c. We will continue the amlodipine and discontinue the hydrochlorothiazide given the concomitant hypokalemia and her intolerance of potassium supplementation. We will reassess a basic metabolic panel at the follow-up visit.

## 2017-05-06 ENCOUNTER — Encounter: Payer: Self-pay | Admitting: Internal Medicine

## 2017-05-06 ENCOUNTER — Ambulatory Visit: Payer: Medicare HMO | Admitting: Internal Medicine

## 2017-05-06 VITALS — BP 128/74 | HR 72 | Ht 63.5 in | Wt 135.4 lb

## 2017-05-06 DIAGNOSIS — D638 Anemia in other chronic diseases classified elsewhere: Secondary | ICD-10-CM

## 2017-05-06 DIAGNOSIS — Z1211 Encounter for screening for malignant neoplasm of colon: Secondary | ICD-10-CM | POA: Diagnosis not present

## 2017-05-06 NOTE — Progress Notes (Signed)
   Gina Kelley 81 y.o. 20-Sep-1935 [id number]  Assessment & Plan:   Encounter Diagnoses  Name Primary?  . Colon cancer screening Yes  . Anemia of chronic disease    She has chosen and asked to do a cologuard.   Subjective:   Chief Complaint: colon cancer screening  HPI Hx negative colonoscopy 10 yrs ago. No GI sxs Does have a stable anemia of chronic dz.  I reviewed PMH, PSH, meds, allergies  Objective:   Physical Exam BP 128/74   Pulse 72   Ht 5' 3.5" (1.613 m)   Wt 135 lb 6.4 oz (61.4 kg)   BMI 23.61 kg/m  NAD  20 minutes time spent with patient > half in counseling coordination of care

## 2017-05-06 NOTE — Patient Instructions (Signed)
  Your provider has ordered Cologuard testing as an option for colon cancer screening. This is performed by [facility] and may be out of network with your insurance. PRIOR to completing the test, it is YOUR responsibility to contact your insurance about covered benefits for this test. Your out of pocket expense could be anywhere from $0.00 to $649.00.   When you call to check coverage with your insurer, please provide the following information:   -The ONLY provider of Cologuard is [facility]  - CPT code for Cologuard is 81528.  -[facility] NPI # [id number]  -[facility] Tax ID # [id number]   We have already sent your demographic and insurance information to [facility] (phone number [phone]) and they should contact you within the next week regarding your test. If you have not heard from them within the next week, please call our office at [phone].   I appreciate the opportunity to care for you. Carl Gessner, MD, FACG  

## 2017-08-25 ENCOUNTER — Other Ambulatory Visit: Payer: Self-pay | Admitting: Internal Medicine

## 2017-08-25 DIAGNOSIS — Z1231 Encounter for screening mammogram for malignant neoplasm of breast: Secondary | ICD-10-CM

## 2017-10-02 ENCOUNTER — Other Ambulatory Visit: Payer: Self-pay | Admitting: Internal Medicine

## 2017-10-02 DIAGNOSIS — E785 Hyperlipidemia, unspecified: Secondary | ICD-10-CM

## 2017-10-15 ENCOUNTER — Ambulatory Visit: Payer: Medicare HMO

## 2017-10-15 ENCOUNTER — Ambulatory Visit
Admission: RE | Admit: 2017-10-15 | Discharge: 2017-10-15 | Disposition: A | Discharge status: Home / Self Care | Payer: Medicare HMO | Source: Ambulatory Visit | Attending: Internal Medicine | Admitting: Internal Medicine

## 2017-10-15 DIAGNOSIS — Z1231 Encounter for screening mammogram for malignant neoplasm of breast: Secondary | ICD-10-CM | POA: Diagnosis not present

## 2018-04-02 ENCOUNTER — Other Ambulatory Visit: Payer: Self-pay | Admitting: Internal Medicine

## 2018-04-02 DIAGNOSIS — I10 Essential (primary) hypertension: Secondary | ICD-10-CM

## 2018-09-26 ENCOUNTER — Encounter: Payer: Self-pay | Admitting: *Deleted

## 2018-11-10 ENCOUNTER — Other Ambulatory Visit: Payer: Self-pay

## 2018-11-10 ENCOUNTER — Ambulatory Visit (INDEPENDENT_AMBULATORY_CARE_PROVIDER_SITE_OTHER): Payer: Medicare HMO | Admitting: Internal Medicine

## 2018-11-10 DIAGNOSIS — Z0000 Encounter for general adult medical examination without abnormal findings: Secondary | ICD-10-CM

## 2018-11-10 DIAGNOSIS — E119 Type 2 diabetes mellitus without complications: Secondary | ICD-10-CM | POA: Diagnosis not present

## 2018-11-10 DIAGNOSIS — I10 Essential (primary) hypertension: Secondary | ICD-10-CM

## 2018-11-10 DIAGNOSIS — K219 Gastro-esophageal reflux disease without esophagitis: Secondary | ICD-10-CM

## 2018-11-10 DIAGNOSIS — E785 Hyperlipidemia, unspecified: Secondary | ICD-10-CM

## 2018-11-10 DIAGNOSIS — Z79899 Other long term (current) drug therapy: Secondary | ICD-10-CM

## 2018-11-10 DIAGNOSIS — E781 Pure hyperglyceridemia: Secondary | ICD-10-CM

## 2018-11-10 NOTE — Assessment & Plan Note (Signed)
She is taking atorvastatin without issue.  Her last LDL was in 2015 and showed only modest control.  Will consider checking a Lipid panel at next visit.   Plan Continue atorvastatin

## 2018-11-10 NOTE — Progress Notes (Signed)
  [facility] Telephone Encounter Continuity Care Appointment  HPI:   This telephone encounter was created for [name] on 11/10/2018 for the following purpose/cc follow up of blood pressure and HLD.  Gina Kelley is an 83 year old woman with PMH of HTN, HLD, GERD, DM 2 diet controlled.  She had not followed up with our clinic in about 1.5 years and so I called her for follow up of her chronic medical issues.  She notes that she is only taking her amlodipine and atorvastatin with good results.  No adverse effects noted by her.  She has had issues in the past with low potassium, but we have not had lab work in 2 years.  She has not tolerated KCL in the past. She was previously on HCTZ and this was stopped at last visit.  I do not have recent vital signs for her to review, however, on current therapy from November of 2018 she was well controlled.  She has no complaints today and specifically denies chest pain, fever, cough, nausea, vomiting, diarrhea, abdominal pain.  She has no need for refills per her.    Past Medical History:  Past Medical History:  Diagnosis Date  . Anemia of chronic disease 07/08/2006  . Antral ulcer 02/23/2007   Seen on EGD in 2008, small ulcer with erosion   . Cerebral vascular accident (HCC) 04/14/2006   1998, left lower extremity numbness, no residual deficits   . Essential hypertension 04/14/2006  . Gastroesophageal reflux disease 02/18/2013   Occasional, symptomatically relieved with peptobismol   . Hyperlipidemia LDL goal < 100 04/14/2006  . Hypertensive retinopathy of both eyes, grade 1 05/15/2016  . Osteopenia of right femoral neck 10/20/2016   DEXA (10/16/2016): R femur T -2.5 (FRAX tool calculates at -2.4), L1-L4 spine T -0.9, 10 year risk for: Major osteoporotic fracture 8.3%, Hip fracture 2.7%  . Type 2 diabetes mellitus with stage 1 chronic kidney disease, without long-term current use of insulin (HCC)   . Type II diabetes  mellitus (HCC) 04/14/2006      ROS:   All reviewed negative, otherwise per HPI.    Assessment / Plan / Recommendations:   Please see A&P under problem oriented charting for assessment of the patient's acute and chronic medical conditions.   As always, pt is advised that if symptoms worsen or new symptoms arise, they should go to an urgent care facility or to to ER for further evaluation.   Consent and Medical Decision Making:   This is a telephone encounter between Gina Kelley and Debe Coder on 11/10/2018 for follow up of HTN and HLD. The visit was conducted with the patient located at home and Debe Coder at [facility]. The patient's identity was confirmed using their DOB and current address. The patient has consented to being evaluated through a telephone encounter and understands the associated risks (an examination cannot be done and the patient may need to come in for an appointment) / benefits (allows the patient to remain at home, decreasing exposure to coronavirus). I personally spent 15 minutes on medical discussion.    Follow up in person in 2-3 months.

## 2018-11-10 NOTE — Assessment & Plan Note (Signed)
She needs to have an eye exam, it appears that her previous PCP Dr. Josem Kaufmann had advised this on multiple occasions.  She is further due for a mammogram, but this is not able to be scheduled at this time.  Will discuss with her at next visit.  She denies any changes to vision, blurry vision, eye pain.

## 2018-11-10 NOTE — Patient Instructions (Signed)
Instructions given over the phone.  

## 2018-11-10 NOTE — Assessment & Plan Note (Signed)
She is doing well.  Denies headache, chest pain, dizziness.  She has not been checking her BP as she does not have a BP cuff.    Plan Continue amlodipine.   Follow up in 2-3 months for in person visit with vital signs and BMET.

## 2018-11-18 ENCOUNTER — Other Ambulatory Visit: Payer: Self-pay | Admitting: Internal Medicine

## 2018-11-18 DIAGNOSIS — Z1231 Encounter for screening mammogram for malignant neoplasm of breast: Secondary | ICD-10-CM

## 2018-12-17 ENCOUNTER — Other Ambulatory Visit: Payer: Self-pay | Admitting: *Deleted

## 2018-12-17 DIAGNOSIS — E785 Hyperlipidemia, unspecified: Secondary | ICD-10-CM

## 2018-12-20 MED ORDER — ATORVASTATIN CALCIUM 80 MG PO TABS: 80.0000 mg | ORAL_TABLET | Freq: Every day | ORAL | 3 refills | Status: DC

## 2019-01-06 ENCOUNTER — Ambulatory Visit: Payer: Medicare HMO

## 2019-01-06 ENCOUNTER — Encounter: Payer: Self-pay | Admitting: *Deleted

## 2019-01-21 ENCOUNTER — Ambulatory Visit: Payer: Medicare HMO | Admitting: Internal Medicine

## 2019-02-21 ENCOUNTER — Other Ambulatory Visit: Payer: Self-pay

## 2019-02-21 ENCOUNTER — Ambulatory Visit
Admission: RE | Admit: 2019-02-21 | Discharge: 2019-02-21 | Disposition: A | Discharge status: Home / Self Care | Payer: Medicare HMO | Source: Ambulatory Visit | Attending: Family Medicine | Admitting: Family Medicine

## 2019-02-21 DIAGNOSIS — Z1231 Encounter for screening mammogram for malignant neoplasm of breast: Secondary | ICD-10-CM

## 2019-03-17 ENCOUNTER — Other Ambulatory Visit: Payer: Self-pay | Admitting: *Deleted

## 2019-03-17 DIAGNOSIS — I10 Essential (primary) hypertension: Secondary | ICD-10-CM

## 2019-03-17 MED ORDER — AMLODIPINE BESYLATE 10 MG PO TABS: 10.0000 mg | ORAL_TABLET | Freq: Every day | ORAL | 3 refills | Status: DC

## 2019-03-18 ENCOUNTER — Other Ambulatory Visit: Payer: Self-pay

## 2019-03-18 ENCOUNTER — Encounter: Payer: Self-pay | Admitting: Internal Medicine

## 2019-03-18 ENCOUNTER — Ambulatory Visit (INDEPENDENT_AMBULATORY_CARE_PROVIDER_SITE_OTHER): Payer: Medicare HMO | Admitting: Internal Medicine

## 2019-03-18 VITALS — BP 124/70 | HR 84 | Temp 98.8°F | Wt 131.0 lb

## 2019-03-18 DIAGNOSIS — Z87891 Personal history of nicotine dependence: Secondary | ICD-10-CM

## 2019-03-18 DIAGNOSIS — D638 Anemia in other chronic diseases classified elsewhere: Secondary | ICD-10-CM | POA: Diagnosis not present

## 2019-03-18 DIAGNOSIS — I129 Hypertensive chronic kidney disease with stage 1 through stage 4 chronic kidney disease, or unspecified chronic kidney disease: Secondary | ICD-10-CM

## 2019-03-18 DIAGNOSIS — I10 Essential (primary) hypertension: Secondary | ICD-10-CM

## 2019-03-18 DIAGNOSIS — K219 Gastro-esophageal reflux disease without esophagitis: Secondary | ICD-10-CM

## 2019-03-18 DIAGNOSIS — Z23 Encounter for immunization: Secondary | ICD-10-CM | POA: Diagnosis not present

## 2019-03-18 DIAGNOSIS — T502X5A Adverse effect of carbonic-anhydrase inhibitors, benzothiadiazides and other diuretics, initial encounter: Secondary | ICD-10-CM | POA: Diagnosis not present

## 2019-03-18 DIAGNOSIS — E876 Hypokalemia: Secondary | ICD-10-CM

## 2019-03-18 DIAGNOSIS — E781 Pure hyperglyceridemia: Secondary | ICD-10-CM | POA: Diagnosis not present

## 2019-03-18 DIAGNOSIS — H35033 Hypertensive retinopathy, bilateral: Secondary | ICD-10-CM | POA: Diagnosis not present

## 2019-03-18 DIAGNOSIS — M8588 Other specified disorders of bone density and structure, other site: Secondary | ICD-10-CM

## 2019-03-18 DIAGNOSIS — E785 Hyperlipidemia, unspecified: Secondary | ICD-10-CM

## 2019-03-18 DIAGNOSIS — Z0000 Encounter for general adult medical examination without abnormal findings: Secondary | ICD-10-CM

## 2019-03-18 DIAGNOSIS — Z79899 Other long term (current) drug therapy: Secondary | ICD-10-CM

## 2019-03-18 DIAGNOSIS — M1711 Unilateral primary osteoarthritis, right knee: Secondary | ICD-10-CM

## 2019-03-18 DIAGNOSIS — E1122 Type 2 diabetes mellitus with diabetic chronic kidney disease: Secondary | ICD-10-CM

## 2019-03-18 DIAGNOSIS — N181 Chronic kidney disease, stage 1: Secondary | ICD-10-CM

## 2019-03-18 DIAGNOSIS — E11319 Type 2 diabetes mellitus with unspecified diabetic retinopathy without macular edema: Secondary | ICD-10-CM | POA: Diagnosis not present

## 2019-03-18 DIAGNOSIS — M85851 Other specified disorders of bone density and structure, right thigh: Secondary | ICD-10-CM

## 2019-03-18 LAB — POCT GLYCOSYLATED HEMOGLOBIN (HGB A1C): Hemoglobin A1C: 6.4 % — AB (ref 4.0–5.6)

## 2019-03-18 LAB — GLUCOSE, CAPILLARY: Glucose-Capillary: 84 mg/dL (ref 70–99)

## 2019-03-18 NOTE — Progress Notes (Signed)
   Subjective:    Patient ID: Gina Kelley, female    DOB: Nov 09, 1935, 83 y.o.   MRN: [id number]  CC: Routine follow up for HTN, DM2  HPI  Gina Kelley is an 83 year old woman with PMH Of HTN, DM2, HLD, osteopenia, anemia of chronic disease who presents for follow up.  Gina Kelley is doing very well.  She has no complaints today.  She notes that she is taking her blood pressure medication and atorvastatin without issue.  She notes that she continues to work in domestic work [employer] and when she does work she will have right knee pain due to arthritis.  She takes tylenol with good results.  She would like to have her hemorrhoid "checked out" at next visit.    Review of Systems  Constitutional: Negative for activity change, appetite change, fatigue and unexpected weight change.  Respiratory: Negative for cough and shortness of breath.   Cardiovascular: Negative for chest pain, palpitations and leg swelling.  Gastrointestinal: Negative for abdominal pain, anal bleeding, blood in stool, constipation, diarrhea, nausea and rectal pain.  Genitourinary: Negative for difficulty urinating, dysuria, enuresis, frequency, hematuria, urgency and vaginal bleeding.  Musculoskeletal: Positive for arthralgias. Negative for back pain, gait problem and joint swelling.  Neurological: Negative for dizziness, weakness, light-headedness and headaches.  Psychiatric/Behavioral: Negative for confusion, decreased concentration and dysphoric mood.       Objective:   Physical Exam Vitals signs and nursing note reviewed.  Constitutional:      General: She is not in acute distress.    Appearance: Normal appearance. She is not toxic-appearing.  HENT:     Head: Normocephalic and atraumatic.  Eyes:     General: No scleral icterus.       Right eye: No discharge.        Left eye: No discharge.     Conjunctiva/sclera: Conjunctivae normal.  Cardiovascular:     Rate and Rhythm: Normal rate and regular rhythm.   Pulses: Normal pulses.     Heart sounds: No murmur.  Pulmonary:     Effort: Pulmonary effort is normal. No respiratory distress.     Breath sounds: Normal breath sounds. No wheezing.  Abdominal:     General: Abdomen is flat.     Palpations: Abdomen is soft.  Musculoskeletal:        General: No swelling, tenderness, deformity or signs of injury.     Right lower leg: No edema.     Left lower leg: No edema.  Skin:    General: Skin is warm and dry.     Findings: No rash.  Neurological:     General: No focal deficit present.     Mental Status: She is alert and oriented to person, place, and time.  Psychiatric:        Mood and Affect: Mood normal.        Behavior: Behavior normal.    Update labs today, will get an A1C, CBC and ferritin, CMET and lipid panel.  Most recent labs were over 2 years ago.        Assessment & Plan:  RTC in 6 months, sooner if needed

## 2019-03-18 NOTE — Patient Instructions (Signed)
Gina Kelley, thank you for coming in today!  It was nice to meet you.   For your osteopenia (low bone mass) please start taking vitamin D 1000 IU daily to help keep your bones strong.   Please come back to see me in 6 months.  We will get some blood work today.    Thank you!

## 2019-03-19 LAB — CMP14 + ANION GAP
ALT: 14 IU/L (ref 0–32)
AST: 28 IU/L (ref 0–40)
Albumin/Globulin Ratio: 1.2 (ref 1.2–2.2)
Albumin: 4.1 g/dL (ref 3.6–4.6)
Alkaline Phosphatase: 87 IU/L (ref 39–117)
Anion Gap: 13 mmol/L (ref 10.0–18.0)
BUN/Creatinine Ratio: 22 (ref 12–28)
BUN: 16 mg/dL (ref 8–27)
Bilirubin Total: <0.2 mg/dL (ref 0.0–1.2)
CO2: 25 mmol/L (ref 20–29)
Calcium: 9.3 mg/dL (ref 8.7–10.3)
Chloride: 103 mmol/L (ref 96–106)
Creatinine, Ser: 0.72 mg/dL (ref 0.57–1.00)
GFR calc Af Amer: 91 mL/min/{1.73_m2} (ref >59)
GFR calc non Af Amer: 79 mL/min/{1.73_m2} (ref >59)
Globulin, Total: 3.3 g/dL (ref 1.5–4.5)
Glucose: 81 mg/dL (ref 65–99)
Potassium: 4.1 mmol/L (ref 3.5–5.2)
Sodium: 141 mmol/L (ref 134–144)
Total Protein: 7.4 g/dL (ref 6.0–8.5)

## 2019-03-19 LAB — LIPID PANEL
Chol/HDL Ratio: 2.8 ratio (ref 0.0–4.4)
Cholesterol, Total: 195 mg/dL (ref 100–199)
HDL: 70 mg/dL (ref >39)
LDL Chol Calc (NIH): 118 mg/dL — ABNORMAL HIGH (ref 0–99)
Triglycerides: 34 mg/dL (ref 0–149)
VLDL Cholesterol Cal: 7 mg/dL (ref 5–40)

## 2019-03-19 LAB — CBC WITH DIFFERENTIAL/PLATELET
Basophils Absolute: 0 10*3/uL (ref 0.0–0.2)
Basos: 1 %
EOS (ABSOLUTE): 0.1 10*3/uL (ref 0.0–0.4)
Eos: 3 %
Hematocrit: 33.8 % — ABNORMAL LOW (ref 34.0–46.6)
Hemoglobin: 10.5 g/dL — ABNORMAL LOW (ref 11.1–15.9)
Immature Grans (Abs): 0 10*3/uL (ref 0.0–0.1)
Immature Granulocytes: 0 %
Lymphocytes Absolute: 0.9 10*3/uL (ref 0.7–3.1)
Lymphs: 18 %
MCH: 28.9 pg (ref 26.6–33.0)
MCHC: 31.1 g/dL — ABNORMAL LOW (ref 31.5–35.7)
MCV: 93 fL (ref 79–97)
Monocytes Absolute: 0.5 10*3/uL (ref 0.1–0.9)
Monocytes: 9 %
Neutrophils Absolute: 3.5 10*3/uL (ref 1.4–7.0)
Neutrophils: 69 %
Platelets: 229 10*3/uL (ref 150–450)
RBC: 3.63 x10E6/uL — ABNORMAL LOW (ref 3.77–5.28)
RDW: 13.6 % (ref 11.7–15.4)
WBC: 5.1 10*3/uL (ref 3.4–10.8)

## 2019-03-19 LAB — FERRITIN: Ferritin: 30 ng/mL (ref 15–150)

## 2019-03-22 ENCOUNTER — Encounter: Payer: Self-pay | Admitting: Internal Medicine

## 2019-03-22 NOTE — Assessment & Plan Note (Signed)
She continues to have a good diet.  Her FRAX score from previous DEXA showed risk of major fracture at 8.3 and hip fracture at 2.7, which does not qualify her for bisphosphonate.  Will discuss with her about repeating this exam at next visit if she is interested.

## 2019-03-22 NOTE — Assessment & Plan Note (Signed)
BP today was well controlled at 124/70.  She is on amlodipine and does not complain of any issues.  No LE edema.  She has a history of HTN retinopathy and is due to see her eye doctor in the next few months.   Plan Continue amlodipine 10mg  daily Renal function was well controlled

## 2019-03-22 NOTE — Assessment & Plan Note (Signed)
Ferritin checked today was low normal.  H/H remain mildly low with normal MCV.  She has no signs/symptoms of symptomatic anemia or blood loss reported today. .  Plan Advise she take once daily iron Evaluate for hemorrhoidal bleeding at next visit.

## 2019-03-22 NOTE — Assessment & Plan Note (Signed)
She reports taking her atorvastatin.  Non fasting LDL today was 108.  Her DM and BP are well controlled.  She is on max therapy and not interested in new medications at this time.

## 2019-03-22 NOTE — Assessment & Plan Note (Addendum)
She will see her eye doctor soon.  Her BP is well controlled.

## 2019-03-22 NOTE — Assessment & Plan Note (Signed)
Mammogram 2020 - negative Flu today Colon 2013 - hemorrhoids, no polyps, due 2023 Lung: Not needed (quit smoking in 1980, 30 years ago) DEXA: 2018 - osteopenia

## 2019-03-22 NOTE — Assessment & Plan Note (Signed)
She reports never having this issue. On review, this is very intermittent.  She continues to have only intermittent symptoms.  No therapy needed today.

## 2019-03-22 NOTE — Assessment & Plan Note (Signed)
She is currently diet controlled only.  She does not check her Blood sugars at home.  She has an elevated LDL, but is already taking a high intensity statin.  BP is well controlled.  She is planning to get an eye exam soon.  She was not able to give a urine sample today.  She was previously MAU/Cr > 30, she is allergic to enalapril.    Will recheck MAU/Cr at next visit.  A1C remains controlled at 6.4.   Continue diet control.

## 2019-06-07 ENCOUNTER — Other Ambulatory Visit: Payer: Self-pay

## 2019-06-07 DIAGNOSIS — Z20822 Contact with and (suspected) exposure to covid-19: Secondary | ICD-10-CM

## 2019-06-08 LAB — NOVEL CORONAVIRUS, NAA: SARS-CoV-2, NAA: NOT DETECTED

## 2019-06-10 ENCOUNTER — Telehealth: Payer: Self-pay | Admitting: General Practice

## 2019-06-10 NOTE — Telephone Encounter (Signed)
° °  Pt rec neg COVID results °

## 2019-08-25 ENCOUNTER — Ambulatory Visit: Payer: Medicare HMO | Attending: Internal Medicine

## 2019-08-25 DIAGNOSIS — Z23 Encounter for immunization: Secondary | ICD-10-CM | POA: Insufficient documentation

## 2019-08-25 NOTE — Progress Notes (Signed)
   Covid-19 Vaccination Clinic  Name:  Gina Kelley    MRN: [id number] DOB: 1936-05-27  08/25/2019  [name] was observed post Covid-19 immunization for 15 minutes without incidence. She was provided with Vaccine Information Sheet and instruction to access the V-Safe system.   [name] was instructed to call 911 with any severe reactions post vaccine: Marland Kitchen Difficulty breathing  . Swelling of your face and throat  . A fast heartbeat  . A bad rash all over your body  . Dizziness and weakness    Immunizations Administered    Name Date Dose VIS Date Route   Pfizer COVID-19 Vaccine 08/25/2019  9:49 AM 0.3 mL 06/10/2019 Intramuscular   Manufacturer: [employer], [employer]   Lot: [id number]   NDC: [address]-4

## 2019-09-14 ENCOUNTER — Ambulatory Visit: Payer: Medicare HMO | Attending: Internal Medicine

## 2019-09-14 DIAGNOSIS — Z23 Encounter for immunization: Secondary | ICD-10-CM

## 2019-09-14 NOTE — Progress Notes (Signed)
   Covid-19 Vaccination Clinic  Name:  YASHICA STERBENZ    MRN: [id number] DOB: January 19, 1936  09/14/2019  Ms. Quist was observed post Covid-19 immunization for 15 minutes without incident. She was provided with Vaccine Information Sheet and instruction to access the V-Safe system.   Ms. Beeghly was instructed to call 911 with any severe reactions post vaccine: Marland Kitchen Difficulty breathing  . Swelling of face and throat  . A fast heartbeat  . A bad rash all over body  . Dizziness and weakness   Immunizations Administered    Name Date Dose VIS Date Route   Pfizer COVID-19 Vaccine 09/14/2019 10:49 AM 0.3 mL 06/10/2019 Intramuscular   Manufacturer: [employer], [employer]   Lot: [id number]   NDC: [id number]

## 2019-11-09 DIAGNOSIS — H40033 Anatomical narrow angle, bilateral: Secondary | ICD-10-CM | POA: Diagnosis not present

## 2019-11-09 DIAGNOSIS — E119 Type 2 diabetes mellitus without complications: Secondary | ICD-10-CM | POA: Diagnosis not present

## 2019-11-18 ENCOUNTER — Other Ambulatory Visit: Payer: Self-pay

## 2019-11-18 ENCOUNTER — Ambulatory Visit (INDEPENDENT_AMBULATORY_CARE_PROVIDER_SITE_OTHER): Payer: Medicare HMO | Admitting: Internal Medicine

## 2019-11-18 VITALS — BP 134/58 | HR 82 | Temp 98.2°F | Wt 135.7 lb

## 2019-11-18 DIAGNOSIS — I129 Hypertensive chronic kidney disease with stage 1 through stage 4 chronic kidney disease, or unspecified chronic kidney disease: Secondary | ICD-10-CM

## 2019-11-18 DIAGNOSIS — D638 Anemia in other chronic diseases classified elsewhere: Secondary | ICD-10-CM

## 2019-11-18 DIAGNOSIS — E1122 Type 2 diabetes mellitus with diabetic chronic kidney disease: Secondary | ICD-10-CM | POA: Diagnosis not present

## 2019-11-18 DIAGNOSIS — H35033 Hypertensive retinopathy, bilateral: Secondary | ICD-10-CM | POA: Diagnosis not present

## 2019-11-18 DIAGNOSIS — M85851 Other specified disorders of bone density and structure, right thigh: Secondary | ICD-10-CM | POA: Diagnosis not present

## 2019-11-18 DIAGNOSIS — E785 Hyperlipidemia, unspecified: Secondary | ICD-10-CM | POA: Diagnosis not present

## 2019-11-18 DIAGNOSIS — N189 Chronic kidney disease, unspecified: Secondary | ICD-10-CM | POA: Diagnosis not present

## 2019-11-18 DIAGNOSIS — E781 Pure hyperglyceridemia: Secondary | ICD-10-CM

## 2019-11-18 DIAGNOSIS — K219 Gastro-esophageal reflux disease without esophagitis: Secondary | ICD-10-CM

## 2019-11-18 DIAGNOSIS — I10 Essential (primary) hypertension: Secondary | ICD-10-CM

## 2019-11-18 DIAGNOSIS — N181 Chronic kidney disease, stage 1: Secondary | ICD-10-CM

## 2019-11-18 LAB — POCT GLYCOSYLATED HEMOGLOBIN (HGB A1C): Hemoglobin A1C: 5.6 % (ref 4.0–5.6)

## 2019-11-18 LAB — GLUCOSE, CAPILLARY: Glucose-Capillary: 111 mg/dL — ABNORMAL HIGH (ref 70–99)

## 2019-11-18 MED ORDER — IRON 90 (18 FE) MG PO TABS: 1.0000 | ORAL_TABLET | Freq: Every day | ORAL | 0 refills | Status: DC

## 2019-11-18 MED ORDER — VITAMIN D3 75 MCG (3000 UT) PO TABS: 1.0000 | ORAL_TABLET | Freq: Every day | ORAL | Status: DC

## 2019-11-18 NOTE — Assessment & Plan Note (Signed)
Controlled with diet.  She is not interested in a controlling medication.   Plan Continue diet control.

## 2019-11-18 NOTE — Assessment & Plan Note (Signed)
She is taking vitamin D.  No falls, no change to symptoms.   Plan Follow up DEXA scan with next mammogram.

## 2019-11-18 NOTE — Progress Notes (Signed)
   Subjective:    Patient ID: Gina Kelley, female    DOB: Sep 28, 1935, 84 y.o.   MRN: [id number]  CC: 6 month follow up for DM2 and HTN  HPI  [name] is an 84 year old woman with PMH of DM2 (diet controlled), HLD, HTn, anemia, GERD, osteopenia who presents for follow up.    [name] reports that she is doing well.  She has started taking vitamin D and iron as requested at last visit.  She has one new complaint which she describes as hemorrhoids.  She notes that when she strains to have a bowel movement, she will have "tissue come out."  She initially reported being from the rectum, but then seemed to note that it was from the vagina.  She has had no blood, no drainage, no discharge, no pain, no weight loss, no night sweats.  She declined exam today and noted that she would like to set up an appointment in July to get an evaluation for this.    Her BP is well controlled today.  She has no further complaints.     Review of Systems  Constitutional: Negative for activity change, appetite change, fatigue, fever and unexpected weight change.  Respiratory: Negative for cough.   Cardiovascular: Negative for chest pain and leg swelling.  Gastrointestinal: Positive for constipation. Negative for abdominal distention, anal bleeding and blood in stool.  Genitourinary: Negative for difficulty urinating and dysuria.       Appears to be describing vaginal prolapse.  Declining exam today.   Musculoskeletal: Negative for arthralgias and back pain.  Neurological: Negative for dizziness and weakness.  Psychiatric/Behavioral: Negative for decreased concentration and dysphoric mood.       Objective:   Physical Exam Vitals and nursing note reviewed.  Constitutional:      General: She is not in acute distress.    Appearance: Normal appearance. She is normal weight. She is not toxic-appearing.  HENT:     Head: Normocephalic and atraumatic.  Eyes:     General: No scleral icterus.       Right  eye: No discharge.        Left eye: No discharge.  Pulmonary:     Effort: Pulmonary effort is normal. No respiratory distress.  Abdominal:     General: Abdomen is flat. There is no distension.     Palpations: Abdomen is soft.     Tenderness: There is no abdominal tenderness.  Skin:    General: Skin is warm and dry.  Neurological:     Mental Status: She is alert. Mental status is at baseline.  Psychiatric:        Mood and Affect: Mood normal.        Behavior: Behavior normal.     She is requesting CBC today to evaluate benefit of iron and vitamin D.       Assessment & Plan:  Return in July for pelvic exam.

## 2019-11-18 NOTE — Assessment & Plan Note (Signed)
She is currently diet controlled.  Last renal function well controlled.  Her A1C is. 5.6.  Her renal function was stable at last check.  She is due for an eye exam and will go see Dr. Nedra Hai.  We will check a lipid panel at next visit.

## 2019-11-18 NOTE — Assessment & Plan Note (Signed)
LDL 118 at last visit, which is a bit high for her.  She is on atorvastatin 80mg  daily.  Recheck at next visit.

## 2019-11-18 NOTE — Assessment & Plan Note (Signed)
She has requested a recheck of CBC today.   Plan Check CBC Continue iron and vitamin D.

## 2019-11-18 NOTE — Assessment & Plan Note (Signed)
Advised her to make an appointment with eye doctor, Dr. Nedra Hai.

## 2019-11-18 NOTE — Assessment & Plan Note (Signed)
BP is well controlled today.  She is on amlodipine 10mg  daily.   Renal function at last visit was normal.   Plan Repeat BMET at next regular visit.   Continue amlodipine.

## 2019-11-18 NOTE — Patient Instructions (Signed)
Ms. Flannery - -  Thank you for coming in to see me today.   For your pelvic issue, please schedule an appointment with me in July.    For your blood pressure and other chronic issues, we can also check on those in July.    Thank you!

## 2019-11-19 LAB — CBC
Hematocrit: 29.5 % — ABNORMAL LOW (ref 34.0–46.6)
Hemoglobin: 9.7 g/dL — ABNORMAL LOW (ref 11.1–15.9)
MCH: 32.1 pg (ref 26.6–33.0)
MCHC: 32.9 g/dL (ref 31.5–35.7)
MCV: 98 fL — ABNORMAL HIGH (ref 79–97)
Platelets: 242 10*3/uL (ref 150–450)
RBC: 3.02 x10E6/uL — ABNORMAL LOW (ref 3.77–5.28)
RDW: 13.7 % (ref 11.7–15.4)
WBC: 4.7 10*3/uL (ref 3.4–10.8)

## 2019-11-24 ENCOUNTER — Encounter: Payer: Self-pay | Admitting: Internal Medicine

## 2020-01-04 ENCOUNTER — Encounter: Payer: Self-pay | Admitting: Internal Medicine

## 2020-01-12 ENCOUNTER — Other Ambulatory Visit: Payer: Self-pay | Admitting: Family Medicine

## 2020-01-12 DIAGNOSIS — Z1231 Encounter for screening mammogram for malignant neoplasm of breast: Secondary | ICD-10-CM

## 2020-01-18 ENCOUNTER — Other Ambulatory Visit: Payer: Self-pay | Admitting: Internal Medicine

## 2020-01-18 DIAGNOSIS — E785 Hyperlipidemia, unspecified: Secondary | ICD-10-CM

## 2020-02-23 ENCOUNTER — Ambulatory Visit
Admission: RE | Admit: 2020-02-23 | Discharge: 2020-02-23 | Disposition: A | Discharge status: Home / Self Care | Payer: Medicare HMO | Source: Ambulatory Visit | Attending: Family Medicine | Admitting: Family Medicine

## 2020-02-23 ENCOUNTER — Other Ambulatory Visit: Payer: Self-pay

## 2020-02-23 DIAGNOSIS — Z1231 Encounter for screening mammogram for malignant neoplasm of breast: Secondary | ICD-10-CM

## 2020-03-02 ENCOUNTER — Encounter: Payer: Medicare HMO | Admitting: Internal Medicine

## 2020-03-29 ENCOUNTER — Other Ambulatory Visit: Payer: Self-pay | Admitting: Student in an Organized Health Care Education/Training Program

## 2020-03-29 DIAGNOSIS — I10 Essential (primary) hypertension: Secondary | ICD-10-CM

## 2020-05-04 ENCOUNTER — Encounter: Payer: Self-pay | Admitting: *Deleted

## 2020-05-04 NOTE — Progress Notes (Signed)

## 2020-05-09 NOTE — Progress Notes (Signed)
Things That May Be Affecting Your Health:  Alcohol  Hearing loss  Pain    Depression  Home Safety  Sexual Health  X Diabetes  Lack of physical activity  Stress   Difficulty with daily activities  Loneliness  Tiredness   Drug use  Medicines  Tobacco use   Falls  Motor Vehicle Safety  Weight   Food choices  Oral Health  Other    YOUR PERSONALIZED HEALTH PLAN : 1. Schedule your next subsequent Medicare Wellness visit in one year 2. Attend all of your regular appointments to address your medical issues 3. Complete the preventative screenings and services   Annual Wellness Visit   Medicare Covered Preventative Screenings and Services  Services & Screenings Men and Women Who How Often Need? Date of Last Service Action  Abdominal Aortic Aneurysm Adults with AAA risk factors Once     Alcohol Misuse and Counseling All Adults Screening once a year if no alcohol misuse. Counseling up to 4 face to face sessions.     Bone Density Measurement  Adults at risk for osteoporosis Once every 2 yrs Yes 2018   Lipid Panel Z13.6 All adults without CV disease Once every 5 yrs Yes 2015   Colorectal Cancer   Stool sample or  Colonoscopy All adults 50 and older   Once every year  Every 10 years  2013 Due 2023  Depression All Adults Once a year  Today   Diabetes Screening Blood glucose, post glucose load, or GTT Z13.1  All adults at risk  Pre-diabetics  Once per year  Twice per year     Diabetes  Self-Management Training All adults Diabetics 10 hrs first year; 2 hours subsequent years. Requires Copay     Glaucoma  Diabetics  Family history of glaucoma  African Americans 50 yrs +  Hispanic Americans 65 yrs + Annually - requires coppay Yes  Needs eye exam for DM exam as well  Hepatitis C Z72.89 or F19.20  High Risk for HCV  Born between 1945 and 1965  Annually  Once Yes    HIV Z11.4 All adults based on risk  Annually btw ages 20 & 68 regardless of risk  Annually > 65 yrs if at  increased risk Yes    Lung Cancer Screening Asymptomatic adults aged 57-77 with 30 pack yr history and current smoker OR quit within the last 15 yrs Annually Must have counseling and shared decision making documentation before first screen No  Quit 1980  Medical Nutrition Therapy Adults with   Diabetes  Renal disease  Kidney transplant within past 3 yrs 3 hours first year; 2 hours subsequent years     Obesity and Counseling All adults Screening once a year Counseling if BMI 30 or higher  Today   Tobacco Use Counseling Adults who use tobacco  Up to 8 visits in one year     Vaccines Z23  Hepatitis B  Influenza   Pneumonia  Adults   Once  Once every flu season  Two different vaccines separated by one year Yes  Flu and pneumonia  Next Annual Wellness Visit People with Medicare Every year  Today     Services & Screenings Women Who How Often Need  Date of Last Service Action  Mammogram  Z12.31 Women over 40 One baseline ages 25-39. Annually ager 40 yrs+ No 2021   Pap tests All women Annually if high risk. Every 2 yrs for normal risk women No    Screening for cervical  cancer with   Pap (Z01.419 nl or Z01.411abnl) &  HPV Z11.51 Women aged 60 to 15 Once every 5 yrs     Screening pelvic and breast exams All women Annually if high risk. Every 2 yrs for normal risk women     Sexually Transmitted Diseases  Chlamydia  Gonorrhea  Syphilis All at risk adults Annually for non pregnant females at increased risk         Services & Screenings Men Who How Ofter Need  Date of Last Service Action  Prostate Cancer - DRE & PSA Men over 50 Annually.  DRE might require a copay.     Sexually Transmitted Diseases  Syphilis All at risk adults Annually for men at increased risk

## 2020-05-11 ENCOUNTER — Other Ambulatory Visit: Payer: Self-pay

## 2020-05-11 ENCOUNTER — Ambulatory Visit (INDEPENDENT_AMBULATORY_CARE_PROVIDER_SITE_OTHER): Payer: Medicare HMO | Admitting: Internal Medicine

## 2020-05-11 ENCOUNTER — Encounter: Payer: Self-pay | Admitting: Internal Medicine

## 2020-05-11 VITALS — BP 133/55 | HR 80 | Temp 98.2°F | Wt 129.2 lb

## 2020-05-11 DIAGNOSIS — N812 Incomplete uterovaginal prolapse: Secondary | ICD-10-CM | POA: Diagnosis not present

## 2020-05-11 DIAGNOSIS — N181 Chronic kidney disease, stage 1: Secondary | ICD-10-CM

## 2020-05-11 DIAGNOSIS — E781 Pure hyperglyceridemia: Secondary | ICD-10-CM | POA: Diagnosis not present

## 2020-05-11 DIAGNOSIS — D631 Anemia in chronic kidney disease: Secondary | ICD-10-CM

## 2020-05-11 DIAGNOSIS — E1122 Type 2 diabetes mellitus with diabetic chronic kidney disease: Secondary | ICD-10-CM | POA: Diagnosis not present

## 2020-05-11 DIAGNOSIS — I129 Hypertensive chronic kidney disease with stage 1 through stage 4 chronic kidney disease, or unspecified chronic kidney disease: Secondary | ICD-10-CM

## 2020-05-11 DIAGNOSIS — H35033 Hypertensive retinopathy, bilateral: Secondary | ICD-10-CM

## 2020-05-11 DIAGNOSIS — I10 Essential (primary) hypertension: Secondary | ICD-10-CM

## 2020-05-11 DIAGNOSIS — M85851 Other specified disorders of bone density and structure, right thigh: Secondary | ICD-10-CM

## 2020-05-11 DIAGNOSIS — N182 Chronic kidney disease, stage 2 (mild): Secondary | ICD-10-CM | POA: Diagnosis not present

## 2020-05-11 DIAGNOSIS — Z0000 Encounter for general adult medical examination without abnormal findings: Secondary | ICD-10-CM | POA: Diagnosis not present

## 2020-05-11 LAB — GLUCOSE, CAPILLARY: Glucose-Capillary: 113 mg/dL — ABNORMAL HIGH (ref 70–99)

## 2020-05-11 NOTE — Progress Notes (Signed)
° °  Subjective:    Patient ID: Gina Kelley, female    DOB: 11/12/1935, 84 y.o.   MRN: [id number]  CC: 6 month follow up for HTN and DM2  HPI   Briefly, Gina Kelley is an 84 year old woman with PMH of DM2 and HTN who presents for follow up.  She has well controlled DM and HTN in the past.  She is due for an eye exam and updating her lab work.   Gina Kelley reports that she has what she thinks is a hemorrhoid that is causing her issues.  She notes that she worries it is swelling.  She has no blood in her stool, constipation, urinary issues.    She is taking her medications without issues or complaints.  Blood pressure today is 133/59.     Review of Systems  Constitutional: Negative for activity change, appetite change, fatigue and fever.  Respiratory: Negative for shortness of breath and wheezing.   Cardiovascular: Negative for chest pain and leg swelling.  Gastrointestinal: Negative for abdominal distention, abdominal pain, anal bleeding, blood in stool, constipation and diarrhea.  Genitourinary: Negative for difficulty urinating, dysuria, enuresis, frequency, hematuria, pelvic pain, vaginal bleeding, vaginal discharge and vaginal pain.  Musculoskeletal: Negative for arthralgias.  Neurological: Negative for dizziness and weakness.       Objective:   Physical Exam Vitals and nursing note reviewed.  Constitutional:      General: She is not in acute distress.    Appearance: She is normal weight. She is not toxic-appearing.  Pulmonary:     Effort: Pulmonary effort is normal. No respiratory distress.  Abdominal:     General: Abdomen is flat.     Palpations: Abdomen is soft.  Genitourinary:    General: Normal vulva.     Vagina: No vaginal discharge.     Comments: She has a stage 3-4 pelvic organ prolapse, appears to be vaginal.  She reports no change in urination or bowel movements.  She has normal pigmentation of the vaginal skin.  She does have a small external hemorrhoid at  the 6 o'clock position.  Neurological:     Mental Status: She is alert and oriented to person, place, and time. Mental status is at baseline.  Psychiatric:        Mood and Affect: Mood normal.        Behavior: Behavior normal.     Lipid profile, cmet, cbc today      Assessment & Plan:  Follow up in 3 months.

## 2020-05-11 NOTE — Patient Instructions (Signed)
Gina Kelley - -  You are due for an eye exam, please schedule with your regular eye doctor when you are able.   You are due for some blood work today.  I will contact you with results next week.   You have vaginal prolapse which is causing the swelling.  I would recommend you try pelvic floor physical therapy, and we can discuss this more next time you are here.    Thank you!  Come back to see me in 3 months.    Pelvic Organ Prolapse Pelvic organ prolapse is the stretching, bulging, or dropping of pelvic organs into an abnormal position. It happens when the muscles and tissues that surround and support pelvic structures become weak or stretched. Pelvic organ prolapse can involve the:  Vagina (vaginal prolapse).  Uterus (uterine prolapse).  Bladder (cystocele).  Rectum (rectocele).  Intestines (enterocele). When organs other than the vagina are involved, they often bulge into the vagina or protrude from the vagina, depending on how severe the prolapse is. What are the causes? This condition may be caused by:  Pregnancy, labor, and childbirth.  Past pelvic surgery.  Decreased production of the hormone estrogen associated with menopause.  Consistently lifting more than 50 lb (23 kg).  Obesity.  Long-term inability to pass stool (chronic constipation).  A cough that lasts a long time (chronic).  Buildup of fluid in the abdomen due to certain diseases and other conditions. What are the signs or symptoms? Symptoms of this condition include:  Passing a little urine (loss of bladder control) when you cough, sneeze, strain, and exercise (stress incontinence). This may be worse immediately after childbirth. It may gradually improve over time.  Feeling pressure in your pelvis or vagina. This pressure may increase when you cough or when you are passing stool.  A bulge that protrudes from the opening of your vagina.  Difficulty passing urine or stool.  Pain in your lower  back.  Pain, discomfort, or disinterest in sex.  Repeated bladder infections (urinary tract infections).  Difficulty inserting a tampon. In some people, this condition causes no symptoms. How is this diagnosed? This condition may be diagnosed based on a vaginal and rectal exam. During the exam, you may be asked to cough and strain while you are lying down, sitting, and standing up. Your health care provider will determine if other tests are required, such as bladder function tests. How is this treated? Treatment for this condition may depend on your symptoms. Treatment may include:  Lifestyle changes, such as changes to your diet.  Emptying your bladder at scheduled times (bladder training therapy). This can help reduce or avoid urinary incontinence.  Estrogen. Estrogen may help mild prolapse by increasing the strength and tone of pelvic floor muscles.  Kegel exercises. These may help mild cases of prolapse by strengthening and tightening the muscles of the pelvic floor.  A soft, flexible device that helps support the vaginal walls and keep pelvic organs in place (pessary). This is inserted into your vagina by your health care provider.  Surgery. This is often the only form of treatment for severe prolapse. Follow these instructions at home:  Avoid drinking beverages that contain caffeine or alcohol.  Increase your intake of high-fiber foods. This can help decrease constipation and straining during bowel movements.  Lose weight if recommended by your health care provider.  Wear a sanitary pad or adult diapers if you have urinary incontinence.  Avoid heavy lifting and straining with exercise and work. Do  not hold your breath when you perform mild to moderate lifting and exercise activities. Limit your activities as directed by your health care provider.  Do Kegel exercises as directed by your health care provider. To do this: ? Squeeze your pelvic floor muscles tight. You should  feel a tight lift in your rectal area and a tightness in your vaginal area. Keep your stomach, buttocks, and legs relaxed. ? Hold the muscles tight for up to 10 seconds. ? Relax your muscles. ? Repeat this exercise 50 times a day, or as many times as told by your health care provider. Continue to do this exercise for at least 4-6 weeks, or for as long as told by your health care provider.  Take over-the-counter and prescription medicines only as told by your health care provider.  If you have a pessary, take care of it as told by your health care provider.  Keep all follow-up visits as told by your health care provider. This is important. Contact a health care provider if you:  Have symptoms that interfere with your daily activities or sex life.  Need medicine to help with the discomfort.  Notice bleeding from your vagina that is not related to your period.  Have a fever.  Have pain or bleeding when you urinate.  Have bleeding when you pass stool.  Pass urine when you have sex.  Have chronic constipation.  Have a pessary that falls out.  Have bad smelling vaginal discharge.  Have an unusual, low pain in your abdomen. Summary  Pelvic organ prolapse is the stretching, bulging, or dropping of pelvic organs into an abnormal position. It happens when the muscles and tissues that surround and support pelvic structures become weak or stretched.  When organs other than the vagina are involved, they often bulge into the vagina or protrude from the vagina, depending on how severe the prolapse is.  In most cases, this condition needs to be treated only if it produces symptoms. Treatment may include lifestyle changes, estrogen, Kegel exercises, pessary insertion, or surgery.  Avoid heavy lifting and straining with exercise and work. Do not hold your breath when you perform mild to moderate lifting and exercise activities. Limit your activities as directed by your health care  provider. This information is not intended to replace advice given to you by your health care provider. Make sure you discuss any questions you have with your health care provider. Document Revised: 07/08/2017 Document Reviewed: 07/08/2017 Elsevier Patient Education  2020 [employer].

## 2020-05-12 LAB — CBC
Hematocrit: 28.9 % — ABNORMAL LOW (ref 34.0–46.6)
Hemoglobin: 9.5 g/dL — ABNORMAL LOW (ref 11.1–15.9)
MCH: 31.4 pg (ref 26.6–33.0)
MCHC: 32.9 g/dL (ref 31.5–35.7)
MCV: 95 fL (ref 79–97)
Platelets: 278 10*3/uL (ref 150–450)
RBC: 3.03 x10E6/uL — ABNORMAL LOW (ref 3.77–5.28)
RDW: 13.4 % (ref 11.7–15.4)
WBC: 4.3 10*3/uL (ref 3.4–10.8)

## 2020-05-12 LAB — CMP14 + ANION GAP
ALT: 12 IU/L (ref 0–32)
AST: 28 IU/L (ref 0–40)
Albumin/Globulin Ratio: 1.3 (ref 1.2–2.2)
Albumin: 3.8 g/dL (ref 3.6–4.6)
Alkaline Phosphatase: 83 IU/L (ref 44–121)
Anion Gap: 12 mmol/L (ref 10.0–18.0)
BUN/Creatinine Ratio: 17 (ref 12–28)
BUN: 15 mg/dL (ref 8–27)
Bilirubin Total: 0.2 mg/dL (ref 0.0–1.2)
CO2: 26 mmol/L (ref 20–29)
Calcium: 9.4 mg/dL (ref 8.7–10.3)
Chloride: 104 mmol/L (ref 96–106)
Creatinine, Ser: 0.86 mg/dL (ref 0.57–1.00)
GFR calc Af Amer: 73 mL/min/{1.73_m2} (ref >59)
GFR calc non Af Amer: 63 mL/min/{1.73_m2} (ref >59)
Globulin, Total: 3 g/dL (ref 1.5–4.5)
Glucose: 105 mg/dL — ABNORMAL HIGH (ref 65–99)
Potassium: 3.9 mmol/L (ref 3.5–5.2)
Sodium: 142 mmol/L (ref 134–144)
Total Protein: 6.8 g/dL (ref 6.0–8.5)

## 2020-05-12 LAB — LIPID PANEL
Chol/HDL Ratio: 3.3 ratio (ref 0.0–4.4)
Cholesterol, Total: 198 mg/dL (ref 100–199)
HDL: 60 mg/dL (ref >39)
LDL Chol Calc (NIH): 130 mg/dL — ABNORMAL HIGH (ref 0–99)
Triglycerides: 45 mg/dL (ref 0–149)
VLDL Cholesterol Cal: 8 mg/dL (ref 5–40)

## 2020-05-15 DIAGNOSIS — N819 Female genital prolapse, unspecified: Secondary | ICD-10-CM | POA: Insufficient documentation

## 2020-05-15 NOTE — Assessment & Plan Note (Signed)
Advised her to make an eye appointment.  She has no change in vision noted today.  BP is well controlled.

## 2020-05-15 NOTE — Assessment & Plan Note (Signed)
LDL is due for checking.  She is on a statin.   Plan Continue statin Check Lipid profile today.

## 2020-05-15 NOTE — Assessment & Plan Note (Signed)
BP is well controlled today on her regimen of amlodipine which she did bring in today.  She has no complaints.  Will check a CMET and a lipid profile today.   Plan Continue amlodipine.

## 2020-05-15 NOTE — Assessment & Plan Note (Signed)
A1C is not collected today, but CBG was 113.  She is not on any medications.  She is due for an eye exam and I did advise her to schedule.   Plan Check lipid profile - on a statin Check CMET A1C at next visit.

## 2020-05-15 NOTE — Assessment & Plan Note (Signed)
This is a new problem for her.  She feels that she has noticed this in the last few months.  She feels it is a hemorrhoid, however, I explained to her what was happening.  She has no urinary or bowel issues.  She notes that she is able to place the organs back into place.  We discussed pelvic floor therapy, pessary and surgery options.  She is against surgery.  She feels that since she is not having any issues, she would like to watchfully wait at this time.   Plan Watchful waiting Pelvic floor therapy if develops symptoms, consider pessary.

## 2020-05-15 NOTE — Assessment & Plan Note (Signed)
She is on vitamin D.  She has no issues with ambulation, no falls, no fractures.    We did not have time to discuss DEXA today, will plan to repeat DEXA at next visit.

## 2020-05-15 NOTE — Assessment & Plan Note (Signed)
She is due for DEXA, will order at next visit.   MMG done this year was negative.    Colonoscopy is due in 2023.

## 2020-05-15 NOTE — Assessment & Plan Note (Signed)
Will plan to check CBC and CMET today.  She is taking iron without issues daily.

## 2020-09-07 ENCOUNTER — Ambulatory Visit (INDEPENDENT_AMBULATORY_CARE_PROVIDER_SITE_OTHER): Payer: Medicare HMO | Admitting: Student

## 2020-09-07 ENCOUNTER — Encounter: Payer: Self-pay | Admitting: Student

## 2020-09-07 ENCOUNTER — Other Ambulatory Visit: Payer: Self-pay

## 2020-09-07 DIAGNOSIS — Z0000 Encounter for general adult medical examination without abnormal findings: Secondary | ICD-10-CM

## 2020-09-07 DIAGNOSIS — E2839 Other primary ovarian failure: Secondary | ICD-10-CM | POA: Diagnosis not present

## 2020-09-07 NOTE — Progress Notes (Signed)
I discussed the AWV findings with the RN who conducted the visit. I was present in the office suite and immediately available to provide assistance and direction throughout the time the service was provided.  Doran Stabler

## 2020-09-07 NOTE — Patient Instructions (Addendum)
Things That May Be Affecting Your Health:  Alcohol  Hearing loss  Pain    Depression  Home Safety  Sexual Health  X Diabetes  Lack of physical activity  Stress   Difficulty with daily activities  Loneliness  Tiredness   Drug use  Medicines  Tobacco use   Falls  Motor Vehicle Safety  Weight   Food choices  Oral Health  Other    YOUR PERSONALIZED HEALTH PLAN : 1. Schedule your next subsequent Medicare Wellness visit in one year 2. Attend all of your regular appointments to address your medical issues 3. Complete the preventative screenings and services 4. [facility] 5. Please go see Dr. Conley Rolls to have your eyes checked 6. Begin seated and standing exercises with exercise band to increase strength and balance. 7. You can get your pneumonia vaccine and some blood work at your next office visit with Dr. Criselda Peaches on 11/23/2020  Annual Wellness Visit                       Medicare Covered Preventative Screenings and Services  Services & Screenings Men and Women Who How Often Need? Date of Last Service Action  Abdominal Aortic Aneurysm Adults with AAA risk factors Once     Alcohol Misuse and Counseling All Adults Screening once a year if no alcohol misuse. Counseling up to 4 face to face sessions.     Bone Density Measurement  Adults at risk for osteoporosis Once every 2 yrs Yes 2018   Lipid Panel Z13.6 All adults without CV disease Once every 5 yrs Yes 2015   Colorectal Cancer   Stool sample or  Colonoscopy All adults 50 and older   Once every year  Every 10 years  2013 Due 2023  Depression All Adults Once a year  Today   Diabetes Screening Blood glucose, post glucose load, or GTT Z13.1  All adults at risk  Pre-diabetics  Once per year  Twice per year     Diabetes  Self-Management Training All adults Diabetics 10 hrs first year; 2 hours subsequent years. Requires Copay      Glaucoma  Diabetics  Family history of glaucoma  African Americans 50 yrs +  Hispanic Americans 65 yrs + Annually - requires coppay Yes  Needs eye exam for DM exam as well  Hepatitis C Z72.89 or F19.20  High Risk for HCV  Born between 1945 and 1965  Annually  Once Yes    HIV Z11.4 All adults based on risk  Annually btw ages 65 & 55 regardless of risk  Annually > 65 yrs if at increased risk Yes    Lung Cancer Screening Asymptomatic adults aged 22-77 with 30 pack yr history and current smoker OR quit within the last 15 yrs Annually Must have counseling and shared decision making documentation before first screen No  Quit 1980  Medical Nutrition Therapy Adults with   Diabetes  Renal disease  Kidney transplant within past 3 yrs 3 hours first year; 2 hours subsequent years     Obesity and Counseling All adults Screening once a year Counseling if BMI 30 or higher  Today   Tobacco Use Counseling Adults who use tobacco  Up to 8 visits in one year     Vaccines Z23  Hepatitis B  Influenza   Pneumonia  Adults   Once  Once every flu season  Two different vaccines  separated by one year Yes  Pneumonia  Next Annual Wellness Visit People with Medicare Every year  Today     Services & Screenings Women Who How Often Need  Date of Last Service Action  Mammogram  Z12.31 Women over 40 One baseline ages 20-39. Annually ager 40 yrs+ No 2021   Pap tests All women Annually if high risk. Every 2 yrs for normal risk women No    Screening for cervical cancer with   Pap (Z01.419 nl or Z01.411abnl) &  HPV Z11.51 Women aged 66 to 17 Once every 5 yrs     Screening pelvic and breast exams All women Annually if high risk. Every 2 yrs for normal risk women     Sexually Transmitted Diseases  Chlamydia  Gonorrhea  Syphilis All at risk adults Annually for non pregnant females at increased risk         Services & Screenings Men Who How Ofter  Need  Date of Last Service Action  Prostate Cancer - DRE & PSA Men over 50 Annually.  DRE might require a copay.     Sexually Transmitted Diseases  Syphilis All at risk adults Annually for men at increased risk        Fall Prevention in the Home, Adult Falls can cause injuries and can happen to people of all ages. There are many things you can do to make your home safe and to help prevent falls. Ask for help when making these changes. What actions can I take to prevent falls? General Instructions  Use good lighting in all rooms. Replace any light bulbs that burn out.  Turn on the lights in dark areas. Use night-lights.  Keep items that you use often in easy-to-reach places. Lower the shelves around your home if needed.  Set up your furniture so you have a clear path. Avoid moving your furniture around.  Do not have throw rugs or other things on the floor that can make you trip.  Avoid walking on wet floors.  If any of your floors are uneven, fix them.  Add color or contrast paint or tape to clearly mark and help you see: ? Grab bars or handrails. ? First and last steps of staircases. ? Where the edge of each step is.  If you use a stepladder: ? Make sure that it is fully opened. Do not climb a closed stepladder. ? Make sure the sides of the stepladder are locked in place. ? Ask someone to hold the stepladder while you use it.  Know where your pets are when moving through your home. What can I do in the bathroom?  Keep the floor dry. Clean up any water on the floor right away.  Remove soap buildup in the tub or shower.  Use nonskid mats or decals on the floor of the tub or shower.  Attach bath mats securely with double-sided, nonslip rug tape.  If you need to sit down in the shower, use a plastic, nonslip stool.  Install grab bars by the toilet and in the tub and shower. Do not use towel bars as grab bars.      What can I do in the bedroom?  Make sure that  you have a light by your bed that is easy to reach.  Do not use any sheets or blankets for your bed that hang to the floor.  Have a firm chair with side arms that you can use for support when you get dressed. What can  I do in the kitchen?  Clean up any spills right away.  If you need to reach something above you, use a step stool with a grab bar.  Keep electrical cords out of the way.  Do not use floor polish or wax that makes floors slippery. What can I do with my stairs?  Do not leave any items on the stairs.  Make sure that you have a light switch at the top and the bottom of the stairs.  Make sure that there are handrails on both sides of the stairs. Fix handrails that are broken or loose.  Install nonslip stair treads on all your stairs.  Avoid having throw rugs at the top or bottom of the stairs.  Choose a carpet that does not hide the edge of the steps on the stairs.  Check carpeting to make sure that it is firmly attached to the stairs. Fix carpet that is loose or worn. What can I do on the outside of my home?  Use bright outdoor lighting.  Fix the edges of walkways and driveways and fix any cracks.  Remove anything that might make you trip as you walk through a door, such as a raised step or threshold.  Trim any bushes or trees on paths to your home.  Check to see if handrails are loose or broken and that both sides of all steps have handrails.  Install guardrails along the edges of any raised decks and porches.  Clear paths of anything that can make you trip, such as tools or rocks.  Have leaves, snow, or ice cleared regularly.  Use sand or salt on paths during winter.  Clean up any spills in your garage right away. This includes grease or oil spills. What other actions can I take?  Wear shoes that: ? Have a low heel. Do not wear high heels. ? Have rubber bottoms. ? Feel good on your feet and fit well. ? Are closed at the toe. Do not wear open-toe  sandals.  Use tools that help you move around if needed. These include: ? Canes. ? Walkers. ? Scooters. ? Crutches.  Review your medicines with your doctor. Some medicines can make you feel dizzy. This can increase your chance of falling. Ask your doctor what else you can do to help prevent falls. Where to find more information  Centers for Disease Control and Prevention, [employer]: FootballExhibition.com.brwww.cdc.gov  [employer] on Aging: https://walker.com/www.nia.nih.gov Contact a doctor if:  You are afraid of falling at home.  You feel weak, drowsy, or dizzy at home.  You fall at home. Summary  There are many simple things that you can do to make your home safe and to help prevent falls.  Ways to make your home safe include removing things that can make you trip and installing grab bars in the bathroom.  Ask for help when making these changes in your home. This information is not intended to replace advice given to you by your health care provider. Make sure you discuss any questions you have with your health care provider. Document Revised: 01/18/2020 Document Reviewed: 01/18/2020 Elsevier Patient Education  2021 Else[employer] Inc.   Health Maintenance, Female Adopting a healthy lifestyle and getting preventive care are important in promoting health and wellness. Ask your health care provider about:  The right schedule for you to have regular tests and exams.  Things you can do on your own to prevent diseases and keep yourself healthy. What should I know about diet, weight,  and exercise? Eat a healthy diet  Eat a diet that includes plenty of vegetables, fruits, low-fat dairy products, and lean protein.  Do not eat a lot of foods that are high in solid fats, added sugars, or sodium.   Maintain a healthy weight Body mass index (BMI) is used to identify weight problems. It estimates body fat based on height and weight. Your health care provider can help determine your BMI and help you achieve or maintain a  healthy weight. Get regular exercise Get regular exercise. This is one of the most important things you can do for your health. Most adults should:  Exercise for at least 150 minutes each week. The exercise should increase your heart rate and make you sweat (moderate-intensity exercise).  Do strengthening exercises at least twice a week. This is in addition to the moderate-intensity exercise.  Spend less time sitting. Even light physical activity can be beneficial. Watch cholesterol and blood lipids Have your blood tested for lipids and cholesterol at 83 years of age, then have this test every 5 years. Have your cholesterol levels checked more often if:  Your lipid or cholesterol levels are high.  You are older than 83 years of age.  You are at high risk for heart disease. What should I know about cancer screening? Depending on your health history and family history, you may need to have cancer screening at various ages. This may include screening for:  Breast cancer.  Cervical cancer.  Colorectal cancer.  Skin cancer.  Lung cancer. What should I know about heart disease, diabetes, and high blood pressure? Blood pressure and heart disease  High blood pressure causes heart disease and increases the risk of stroke. This is more likely to develop in people who have high blood pressure readings, are of African descent, or are overweight.  Have your blood pressure checked: ? Every 3-5 years if you are 42-34 years of age. ? Every year if you are 34 years old or older. Diabetes Have regular diabetes screenings. This checks your fasting blood sugar level. Have the screening done:  Once every three years after age 40 if you are at a normal weight and have a low risk for diabetes.  More often and at a younger age if you are overweight or have a high risk for diabetes. What should I know about preventing infection? Hepatitis B If you have a higher risk for hepatitis B, you should  be screened for this virus. Talk with your health care provider to find out if you are at risk for hepatitis B infection. Hepatitis C Testing is recommended for:  Everyone born from [birth year] through 1963.  Anyone with known risk factors for hepatitis C. Sexually transmitted infections (STIs)  Get screened for STIs, including gonorrhea and chlamydia, if: ? You are sexually active and are younger than 83 years of age. ? You are older than 83 years of age and your health care provider tells you that you are at risk for this type of infection. ? Your sexual activity has changed since you were last screened, and you are at increased risk for chlamydia or gonorrhea. Ask your health care provider if you are at risk.  Ask your health care provider about whether you are at high risk for HIV. Your health care provider may recommend a prescription medicine to help prevent HIV infection. If you choose to take medicine to prevent HIV, you should first get tested for HIV. You should then be tested every 3  months for as long as you are taking the medicine. Pregnancy  If you are about to stop having your period (premenopausal) and you may become pregnant, seek counseling before you get pregnant.  Take 400 to 800 micrograms (mcg) of folic acid every day if you become pregnant.  Ask for birth control (contraception) if you want to prevent pregnancy. Osteoporosis and menopause Osteoporosis is a disease in which the bones lose minerals and strength with aging. This can result in bone fractures. If you are 2 years old or older, or if you are at risk for osteoporosis and fractures, ask your health care provider if you should:  Be screened for bone loss.  Take a calcium or vitamin D supplement to lower your risk of fractures.  Be given hormone replacement therapy (HRT) to treat symptoms of menopause. Follow these instructions at home: Lifestyle  Do not use any products that contain nicotine or tobacco, such  as cigarettes, e-cigarettes, and chewing tobacco. If you need help quitting, ask your health care provider.  Do not use street drugs.  Do not share needles.  Ask your health care provider for help if you need support or information about quitting drugs. Alcohol use  Do not drink alcohol if: ? Your health care provider tells you not to drink. ? You are pregnant, may be pregnant, or are planning to become pregnant.  If you drink alcohol: ? Limit how much you use to 0-1 drink a day. ? Limit intake if you are breastfeeding.  Be aware of how much alcohol is in your drink. In the [place] one drink equals one 12 oz bottle of beer (355 mL), one 5 oz glass of wine (148 mL), or one 1 oz glass of hard liquor (44 mL). General instructions  Schedule regular health, dental, and eye exams.  Stay current with your vaccines.  Tell your health care provider if: ? You often feel depressed. ? You have ever been abused or do not feel safe at home. Summary  Adopting a healthy lifestyle and getting preventive care are important in promoting health and wellness.  Follow your health care provider's instructions about healthy diet, exercising, and getting tested or screened for diseases.  Follow your health care provider's instructions on monitoring your cholesterol and blood pressure. This information is not intended to replace advice given to you by your health care provider. Make sure you discuss any questions you have with your health care provider. Document Revised: 06/09/2018 Document Reviewed: 06/09/2018 Elsevier Patient Education  2021 [employer].

## 2020-09-07 NOTE — Progress Notes (Signed)
This AWV is being conducted by TELEHEALTH - AUDIO only. The patient was located at home and I was located in [facility]. The patient's identity was confirmed using their DOB and current address. The patient or his/her legal guardian has consented to being evaluated through a telephone encounter and understands the associated risks (an examination cannot be done and the patient may need to come in for an appointment) / benefits (allows the patient to remain at home, decreasing exposure to coronavirus). I personally spent 35 minutes conducting the AWV.  Subjective:   Gina Kelley is a 85 y.o. female who presents for a Medicare Annual Wellness Visit.  The following items have been reviewed and updated today in the appropriate area in the EMR.   Health Risk Assessment  Height, weight, BMI, and BP Visual acuity if needed Depression screen Fall risk / safety level Advance directive discussion Medical and family history were reviewed and updated Updating list of other providers & suppliers Medication reconciliation, including over the counter medicines Cognitive screen Written screening schedule Risk Factor list Personalized health advice, risky behaviors, and treatment advice  Social History   Social History Narrative   Current Social History 09/07/2020        Patient lives with husband in one level home with 5 outside steps with handrails on both sides        Patient's method of transportation is personal car (drives herself)      The highest level of education was 9 th grade      The patient currently retired from [employer] (due to [employer]).      Identified important Relationships are, "My family"       Pets : None       Interests / Fun: "Go to [employer], sing, read. Went to gym before [employer]."       Current Stressors: "I don't have any. I sing it away."       Religious / Personal Beliefs: "I believe in my Lord and Savior, [employer], the son of the Living [employer]."       Other: "I  like to follow the rules and regulations." (Covid guidelines) "I'm easy to get along with."      L. Leotha Voeltz, BSN, RN-BC            Objective:    Vitals: There were no vitals taken for this visit. Vitals are unable to obtained due to COVID-19 public health emergency  Activities of Daily Living In your present state of health, do you have any difficulty performing the following activities: 09/07/2020 05/11/2020  Hearing? N N  Vision? Y N  Difficulty concentrating or making decisions? N N  Walking or climbing stairs? N N  Dressing or bathing? N N  Doing errands, shopping? N N  Some recent data might be hidden    Goals Goals    .  Blood Pressure < 140/90    .  Help great, great grandchildren through school (pt-stated)    .  HEMOGLOBIN A1C < 7.0    .  Maintain same amount of physical activity      Walking 10 minutes daily    .  Stay well enough to go back to [place] (pt-stated)       Fall Risk Fall Risk  09/07/2020 05/11/2020 11/18/2019 03/18/2019 04/10/2017  Falls in the past year? 0 0 0 0 No  Risk for fall due to : No Fall Risks No Fall Risks - - -  Follow  up Education provided;Falls prevention discussed - Falls evaluation completed Falls prevention discussed -   CDC Handout on Fall Prevention and Handout on Home Exercise Program, Access codes [id number] and [id number] mailed to patient with exercise band.    Depression Screen PHQ 2/9 Scores 09/07/2020 05/11/2020 11/18/2019 03/18/2019  PHQ - 2 Score 0 0 0 0  PHQ- 9 Score 0 - 0 -     Cognitive Testing Six-Item Cognitive Screener   "I would like to ask you some questions that ask you to use your memory. I am going to name three objects. Please wait until I say all three words, then repeat them. Remember what they are  because I am going to ask you to name them again in a few minutes. Please repeat these words for me: APPLE--TABLE--PENNY." (Interviewer may repeat names 3 times if necessary but repetition not scored.)  Did patient  correctly repeat all three words? Yes - may proceed with screen  What year is this? Correct What month is this? Correct What day of the week is this? Correct  What were the three objects I asked you to remember? . Apple Correct . Table Correct . [name] Unable to state  Score one point for each incorrect answer.  A score of 2 or more points warrants additional investigation.  Patient's score 1    Assessment and Plan:     [facility] Patient will see Dr. Conley Rolls for eye exam She will begin seated and standing exercises with exercise band to increase strength and balance. She will get Prevnar vaccine and blood work at next OV with Dr. Criselda Peaches on 11/23/2020  During the course of the visit the patient was educated and counseled about appropriate screening and preventive services as documented in the assessment and plan.  The printed AVS was given to the patient and included an updated screening schedule, a list of risk factors, and personalized health advice.        Fredderick Severance, RN  09/07/2020

## 2020-09-10 NOTE — Progress Notes (Signed)
[facility] Medicine Clinic Attending  Case discussed with Dr. Nguyen at the time of the visit.  We reviewed the AWV findings.  I agree with the assessment, diagnosis, and plan of care documented in the AWV note.     

## 2020-09-14 ENCOUNTER — Other Ambulatory Visit: Payer: Self-pay | Admitting: Internal Medicine

## 2020-09-14 ENCOUNTER — Other Ambulatory Visit: Payer: Self-pay | Admitting: Family Medicine

## 2020-09-14 DIAGNOSIS — Z1231 Encounter for screening mammogram for malignant neoplasm of breast: Secondary | ICD-10-CM

## 2020-11-17 LAB — HM DIABETES EYE EXAM

## 2020-11-22 ENCOUNTER — Encounter: Payer: Self-pay | Admitting: Dietician

## 2020-11-23 ENCOUNTER — Other Ambulatory Visit: Payer: Self-pay

## 2020-11-23 ENCOUNTER — Encounter: Payer: Self-pay | Admitting: Internal Medicine

## 2020-11-23 ENCOUNTER — Ambulatory Visit (INDEPENDENT_AMBULATORY_CARE_PROVIDER_SITE_OTHER): Payer: Medicare HMO | Admitting: Internal Medicine

## 2020-11-23 VITALS — BP 150/68 | HR 68 | Temp 98.1°F | Ht 63.0 in | Wt 123.3 lb

## 2020-11-23 DIAGNOSIS — D631 Anemia in chronic kidney disease: Secondary | ICD-10-CM | POA: Diagnosis not present

## 2020-11-23 DIAGNOSIS — E1122 Type 2 diabetes mellitus with diabetic chronic kidney disease: Secondary | ICD-10-CM | POA: Diagnosis not present

## 2020-11-23 DIAGNOSIS — M85851 Other specified disorders of bone density and structure, right thigh: Secondary | ICD-10-CM

## 2020-11-23 DIAGNOSIS — E781 Pure hyperglyceridemia: Secondary | ICD-10-CM | POA: Diagnosis not present

## 2020-11-23 DIAGNOSIS — I10 Essential (primary) hypertension: Secondary | ICD-10-CM | POA: Diagnosis not present

## 2020-11-23 DIAGNOSIS — N812 Incomplete uterovaginal prolapse: Secondary | ICD-10-CM

## 2020-11-23 DIAGNOSIS — H35033 Hypertensive retinopathy, bilateral: Secondary | ICD-10-CM | POA: Diagnosis not present

## 2020-11-23 DIAGNOSIS — N181 Chronic kidney disease, stage 1: Secondary | ICD-10-CM | POA: Diagnosis not present

## 2020-11-23 LAB — POCT GLYCOSYLATED HEMOGLOBIN (HGB A1C): Hemoglobin A1C: 5.9 % — AB (ref 4.0–5.6)

## 2020-11-23 LAB — GLUCOSE, CAPILLARY: Glucose-Capillary: 131 mg/dL — ABNORMAL HIGH (ref 70–99)

## 2020-11-23 NOTE — Progress Notes (Signed)
   Subjective:    Patient ID: Gina Kelley, female    DOB: Nov 01, 1935, 85 y.o.   MRN: [id number]  CC: 6 month follow up for HTN, HLD, DM2  HPI  Gina Kelley is an 85 year old woman with PMH of DM2 (diet controlled) HTN, anemia of chronic disease, GERD, HLD, osteopenia who presents for follow up.   Today, Gina Kelley reports that she is doing well.  She notes that her leg pain is improved since starting iron and vitamin D. She was sent exercises to do in the mail after her AWV and she notes these are helping.  She continues to have prolapse, but she is able to reduce this.  It occurs with straining or lifting objects.  She remains working and is required to do some of this for her work.  We will check visually at next visit.  She has her DEXA and eye exam scheduled.  She has elevated BP, but notes that she did not take her medications this morning yet.    Review of Systems  Constitutional: Negative for activity change, appetite change, fatigue and fever.  Respiratory: Negative for cough and shortness of breath.   Cardiovascular: Negative for chest pain, palpitations and leg swelling.  Gastrointestinal: Negative for anal bleeding, blood in stool, constipation and diarrhea.       No abdominal distention or bloating  Genitourinary: Negative for difficulty urinating, dysuria, pelvic pain, vaginal bleeding, vaginal discharge and vaginal pain.  Musculoskeletal: Negative for arthralgias.  Skin: Negative for color change, pallor and rash.  Neurological: Negative for dizziness and headaches.  Psychiatric/Behavioral: Negative for decreased concentration and dysphoric mood.       Objective:   Physical Exam Vitals and nursing note reviewed.  Constitutional:      General: She is not in acute distress.    Appearance: Normal appearance. She is normal weight. She is not toxic-appearing.  HENT:     Head: Normocephalic and atraumatic.  Eyes:     General:        Right eye: No discharge.         Left eye: No discharge.     Conjunctiva/sclera: Conjunctivae normal.  Cardiovascular:     Rate and Rhythm: Normal rate and regular rhythm.     Heart sounds: No murmur heard.   Pulmonary:     Effort: Pulmonary effort is normal. No respiratory distress.     Breath sounds: Normal breath sounds. No wheezing.  Abdominal:     General: Abdomen is flat. Bowel sounds are normal. There is no distension.     Palpations: Abdomen is soft.  Musculoskeletal:        General: No swelling or tenderness.     Right lower leg: No edema.     Left lower leg: No edema.  Skin:    General: Skin is warm and dry.  Neurological:     General: No focal deficit present.     Mental Status: She is alert. Mental status is at baseline.  Psychiatric:        Mood and Affect: Mood normal.        Behavior: Behavior normal.     Lipid panel today A1C is 5.9 - remains diet controlled.       Assessment & Plan:  Return in 6 months, sooner if needed

## 2020-11-23 NOTE — Assessment & Plan Note (Signed)
She is due for repeat DEXA scanning which we will do this summer in August.  I am concerned that she likely has osteoporosis at this time.  She is taking daily vitamin D.

## 2020-11-23 NOTE — Assessment & Plan Note (Signed)
She is due to see her eye doctor this summer.

## 2020-11-23 NOTE — Assessment & Plan Note (Signed)
She is doing well taking her iron.  She reports no blood loss, no weakness or falls.

## 2020-11-23 NOTE — Assessment & Plan Note (Signed)
LDL at last check was high at 130.  Lipid panel today.  Encouraged daily use of atorvastatin.

## 2020-11-23 NOTE — Assessment & Plan Note (Signed)
She reports no increased number of issues, symptoms are improved with exercises.  We will do a pelvic exam to evaluate at next visit.  She denies bleeding, return of periods.

## 2020-11-23 NOTE — Patient Instructions (Signed)
Gina Kelley - -  You are doing well.  Thank you for coming in today.   Please keep taking your medications as you are  Come back to see me in 6 months.   I will call you with your cholesterol results.

## 2020-11-23 NOTE — Assessment & Plan Note (Signed)
She is doing well on diet control only.  A1C today is 5.9.  Her last LDL was 130.  She reports taking her atorvastatin 80mg  daily and she brought this in today.  Will plan to check lipid profile today.   Foot exam today showed no neuropathy.   Will check A1C every 6 months Lipid profile today.

## 2020-11-23 NOTE — Assessment & Plan Note (Signed)
BP is elevated today.  She did not take her medications.  Has been controlled on current regimen previously.  Renal function has been controlled.  No change in vision, no headaches, chest pain.   Plan Continue amlodipine

## 2020-11-24 LAB — LIPID PANEL
Chol/HDL Ratio: 3.5 ratio (ref 0.0–4.4)
Cholesterol, Total: 221 mg/dL — ABNORMAL HIGH (ref 100–199)
HDL: 63 mg/dL (ref >39)
LDL Chol Calc (NIH): 153 mg/dL — ABNORMAL HIGH (ref 0–99)
Triglycerides: 32 mg/dL (ref 0–149)
VLDL Cholesterol Cal: 5 mg/dL (ref 5–40)

## 2021-02-26 ENCOUNTER — Ambulatory Visit
Admission: RE | Admit: 2021-02-26 | Discharge: 2021-02-26 | Disposition: A | Discharge status: Home / Self Care | Payer: Medicare (Managed Care) | Source: Ambulatory Visit | Attending: Internal Medicine | Admitting: Internal Medicine

## 2021-02-26 ENCOUNTER — Other Ambulatory Visit: Payer: Self-pay

## 2021-02-26 DIAGNOSIS — E2839 Other primary ovarian failure: Secondary | ICD-10-CM

## 2021-02-26 DIAGNOSIS — Z1231 Encounter for screening mammogram for malignant neoplasm of breast: Secondary | ICD-10-CM

## 2021-02-27 ENCOUNTER — Other Ambulatory Visit: Payer: Self-pay | Admitting: Student

## 2021-02-27 DIAGNOSIS — M810 Age-related osteoporosis without current pathological fracture: Secondary | ICD-10-CM

## 2021-02-27 NOTE — Progress Notes (Addendum)
I called and spoke to patient about her bone density exam results.  Her T score was -3.1, which places her in the osteoporosis category.  Patient denies any fracture in the past.  No history of smoking.  She is currently taking vitamin D supplement for a long time.  I will obtain vitamin D level.  If normal, will start oral bisphosphonate.  Patient denies any history of esophageal stricture or esophagitis.  Her renal function is normal.  Can also consider subq anabolic therapy if it is covered under her insurance.  Addendum Vitamin D level within normal limits.  We will start Fosamax 70 mg weekly.  Patient declined subcu therapy.  Patient denies history of esophageal stricture, esophagitis or dysphagia.  No bariatric surgery.  No CKD.  Advised patient to keep in upright position for at least 30 minutes after taking the pill.  -Fosamax 70 mg weekly

## 2021-03-08 ENCOUNTER — Other Ambulatory Visit (INDEPENDENT_AMBULATORY_CARE_PROVIDER_SITE_OTHER): Payer: Medicare (Managed Care)

## 2021-03-08 DIAGNOSIS — M810 Age-related osteoporosis without current pathological fracture: Secondary | ICD-10-CM

## 2021-03-11 LAB — VITAMIN D 25 HYDROXY (VIT D DEFICIENCY, FRACTURES): Vit D, 25-Hydroxy: 32.6 ng/mL (ref 30.0–100.0)

## 2021-03-18 MED ORDER — ALENDRONATE SODIUM 70 MG PO TABS: 70.0000 mg | ORAL_TABLET | ORAL | 3 refills | Status: DC

## 2021-03-18 NOTE — Addendum Note (Signed)
Addended byDoran Stabler on: 03/18/2021 02:59 PM   Modules accepted: Orders

## 2021-04-06 ENCOUNTER — Other Ambulatory Visit: Payer: Self-pay | Admitting: Internal Medicine

## 2021-04-06 DIAGNOSIS — I10 Essential (primary) hypertension: Secondary | ICD-10-CM

## 2021-05-21 ENCOUNTER — Other Ambulatory Visit: Payer: Self-pay | Admitting: Internal Medicine

## 2021-05-21 DIAGNOSIS — E785 Hyperlipidemia, unspecified: Secondary | ICD-10-CM

## 2021-05-21 NOTE — Telephone Encounter (Signed)
Next appt scheduled 05/31/21 with PCP. 

## 2021-05-31 ENCOUNTER — Encounter: Payer: Medicare (Managed Care) | Admitting: Internal Medicine

## 2021-07-12 ENCOUNTER — Ambulatory Visit (INDEPENDENT_AMBULATORY_CARE_PROVIDER_SITE_OTHER): Payer: Medicare HMO | Admitting: Internal Medicine

## 2021-07-12 ENCOUNTER — Encounter: Payer: Self-pay | Admitting: Internal Medicine

## 2021-07-12 ENCOUNTER — Other Ambulatory Visit: Payer: Self-pay

## 2021-07-12 VITALS — BP 128/67 | HR 84 | Temp 98.2°F | Ht 63.0 in | Wt 122.8 lb

## 2021-07-12 DIAGNOSIS — I10 Essential (primary) hypertension: Secondary | ICD-10-CM

## 2021-07-12 DIAGNOSIS — E781 Pure hyperglyceridemia: Secondary | ICD-10-CM | POA: Diagnosis not present

## 2021-07-12 DIAGNOSIS — E1122 Type 2 diabetes mellitus with diabetic chronic kidney disease: Secondary | ICD-10-CM

## 2021-07-12 DIAGNOSIS — D631 Anemia in chronic kidney disease: Secondary | ICD-10-CM | POA: Diagnosis not present

## 2021-07-12 DIAGNOSIS — Z0000 Encounter for general adult medical examination without abnormal findings: Secondary | ICD-10-CM

## 2021-07-12 DIAGNOSIS — H35033 Hypertensive retinopathy, bilateral: Secondary | ICD-10-CM

## 2021-07-12 DIAGNOSIS — N812 Incomplete uterovaginal prolapse: Secondary | ICD-10-CM

## 2021-07-12 DIAGNOSIS — M85851 Other specified disorders of bone density and structure, right thigh: Secondary | ICD-10-CM | POA: Diagnosis not present

## 2021-07-12 DIAGNOSIS — K219 Gastro-esophageal reflux disease without esophagitis: Secondary | ICD-10-CM | POA: Diagnosis not present

## 2021-07-12 DIAGNOSIS — N181 Chronic kidney disease, stage 1: Secondary | ICD-10-CM

## 2021-07-12 DIAGNOSIS — I129 Hypertensive chronic kidney disease with stage 1 through stage 4 chronic kidney disease, or unspecified chronic kidney disease: Secondary | ICD-10-CM

## 2021-07-12 LAB — POCT GLYCOSYLATED HEMOGLOBIN (HGB A1C): Hemoglobin A1C: 6.1 % — AB (ref 4.0–5.6)

## 2021-07-12 LAB — GLUCOSE, CAPILLARY: Glucose-Capillary: 118 mg/dL — ABNORMAL HIGH (ref 70–99)

## 2021-07-12 NOTE — Progress Notes (Signed)
° °  Subjective:    Patient ID: Gina Kelley, female    DOB: 01-09-1936, 86 y.o.   MRN: [id number]  6 month follow up for HTN and DM2  HPI  Gina Kelley is an 86 year old woman with PMH of DM2, HLD, normocytic anemia, GERD< HTN, HTN retinopathy, osteoporosis, vaginal prolapse who presents for follow up.   Gina Kelley reports that she is feeling well.  We discussed her prolapse and she notes that it only happens when she is lifting heavy things or doing housework.  Otherwise, it remains in place.  She has had no bleeding or return of periods, no pain.  She declined exam today given she was not prepared.  We discussed return precautions.    She is due for blood work today including checking her renal function and anemia.  She wanted to discuss her fosamax and we discussed the indications for this medication going forward.  She is taking it correctly, sitting up right with a full glass of water.  She has no GI issues.   Review of Systems  Constitutional:  Negative for activity change, appetite change and fatigue.  Eyes:  Negative for photophobia, discharge, itching and visual disturbance.  Respiratory:  Negative for cough and shortness of breath.   Cardiovascular:  Positive for leg swelling (mild, ankles, at the end of the day, depends on what she does during the day). Negative for chest pain.  Gastrointestinal:  Negative for abdominal distention, abdominal pain, constipation and diarrhea.  Genitourinary:  Negative for difficulty urinating, dysuria, frequency, pelvic pain, vaginal bleeding, vaginal discharge and vaginal pain.  Musculoskeletal:  Negative for arthralgias and back pain.  Neurological:  Negative for weakness.  Psychiatric/Behavioral:  Negative for decreased concentration and dysphoric mood.       Objective:   Physical Exam Vitals and nursing note reviewed.  Constitutional:      Appearance: Normal appearance.  HENT:     Head: Normocephalic and atraumatic.  Cardiovascular:      Rate and Rhythm: Normal rate and regular rhythm.  Pulmonary:     Effort: Pulmonary effort is normal. No respiratory distress.     Breath sounds: Normal breath sounds.  Musculoskeletal:        General: No swelling, tenderness, deformity or signs of injury.     Right lower leg: No edema.     Left lower leg: No edema.  Skin:    General: Skin is warm and dry.     Coloration: Skin is not jaundiced.  Neurological:     Mental Status: She is alert and oriented to person, place, and time. Mental status is at baseline.  Psychiatric:        Mood and Affect: Mood normal.        Behavior: Behavior normal.    Patient declined GU exam.  She will have this done at next visit.   Will plan to update labs today including LDL, CBC, CMET, iron, ferritin, MAU/Cr.       Assessment & Plan:

## 2021-07-12 NOTE — Patient Instructions (Addendum)
Gina Kelley - -  Your blood pressure is very good.  Please keep taking you medication.   You have low bone mass, you should continue to take your fosamax (alendronate) to help stabilize your bone mass and decrease your chance of having a fracture.   Your diabetes is also very well controlled.  Your A1C was 6.1.  Please keep eating the same diet you have been.   Debe Coder, MD

## 2021-07-13 LAB — CMP14 + ANION GAP
ALT: 13 IU/L (ref 0–32)
AST: 24 IU/L (ref 0–40)
Albumin/Globulin Ratio: 1.2 (ref 1.2–2.2)
Albumin: 3.8 g/dL (ref 3.6–4.6)
Alkaline Phosphatase: 71 IU/L (ref 44–121)
Anion Gap: 13 mmol/L (ref 10.0–18.0)
BUN/Creatinine Ratio: 23 (ref 12–28)
BUN: 17 mg/dL (ref 8–27)
Bilirubin Total: 0.2 mg/dL (ref 0.0–1.2)
CO2: 26 mmol/L (ref 20–29)
Calcium: 9.3 mg/dL (ref 8.7–10.3)
Chloride: 102 mmol/L (ref 96–106)
Creatinine, Ser: 0.75 mg/dL (ref 0.57–1.00)
Globulin, Total: 3.2 g/dL (ref 1.5–4.5)
Glucose: 105 mg/dL — ABNORMAL HIGH (ref 70–99)
Potassium: 3.8 mmol/L (ref 3.5–5.2)
Sodium: 141 mmol/L (ref 134–144)
Total Protein: 7 g/dL (ref 6.0–8.5)
eGFR: 79 mL/min/{1.73_m2} (ref >59)

## 2021-07-13 LAB — CBC WITH DIFFERENTIAL/PLATELET
Basophils Absolute: 0 10*3/uL (ref 0.0–0.2)
Basos: 1 %
EOS (ABSOLUTE): 0 10*3/uL (ref 0.0–0.4)
Eos: 1 %
Hematocrit: 32.7 % — ABNORMAL LOW (ref 34.0–46.6)
Hemoglobin: 10.5 g/dL — ABNORMAL LOW (ref 11.1–15.9)
Immature Grans (Abs): 0 10*3/uL (ref 0.0–0.1)
Immature Granulocytes: 0 %
Lymphocytes Absolute: 0.7 10*3/uL (ref 0.7–3.1)
Lymphs: 14 %
MCH: 30.2 pg (ref 26.6–33.0)
MCHC: 32.1 g/dL (ref 31.5–35.7)
MCV: 94 fL (ref 79–97)
Monocytes Absolute: 0.4 10*3/uL (ref 0.1–0.9)
Monocytes: 8 %
Neutrophils Absolute: 4 10*3/uL (ref 1.4–7.0)
Neutrophils: 76 %
Platelets: 264 10*3/uL (ref 150–450)
RBC: 3.48 x10E6/uL — ABNORMAL LOW (ref 3.77–5.28)
RDW: 13.5 % (ref 11.7–15.4)
WBC: 5.2 10*3/uL (ref 3.4–10.8)

## 2021-07-13 LAB — IRON AND TIBC
Iron Saturation: 32 % (ref 15–55)
Iron: 99 ug/dL (ref 27–139)
Total Iron Binding Capacity: 311 ug/dL (ref 250–450)
UIBC: 212 ug/dL (ref 118–369)

## 2021-07-13 LAB — FERRITIN: Ferritin: 51 ng/mL (ref 15–150)

## 2021-07-13 LAB — LDL CHOLESTEROL, DIRECT: LDL Direct: 148 mg/dL — ABNORMAL HIGH (ref 0–99)

## 2021-07-14 LAB — MICROALBUMIN / CREATININE URINE RATIO
Creatinine, Urine: 90.5 mg/dL
Microalb/Creat Ratio: 235 mg/g creat — ABNORMAL HIGH (ref 0–29)
Microalbumin, Urine: 212.4 ug/mL

## 2021-07-17 NOTE — Assessment & Plan Note (Signed)
Will plan to check CBC, iron and ferritin today.

## 2021-07-17 NOTE — Assessment & Plan Note (Signed)
She declined any exam today.  She notes only prolapses when she is straining or picking things up, no bleeding.   Plan Pelvic exam at next visit Return precautions discussed.

## 2021-07-17 NOTE — Assessment & Plan Note (Signed)
BP today was well controlled.  She brings in her medication amlodipine which she is taking without issue.  No gum issues, no LE swelling.  She does have an increase in MAU/cr from labs today, so we will discuss adding an ARB at next visit.   Plan Continue amlodipine Consider addition of low dose arb at next visit.

## 2021-07-17 NOTE — Assessment & Plan Note (Signed)
She has no current complaints, just occasional symptoms.  Continue to monitor.

## 2021-07-17 NOTE — Assessment & Plan Note (Signed)
She is due for an eye exam and I reminded her to schedule.

## 2021-07-17 NOTE — Assessment & Plan Note (Addendum)
A1C remains well controlled today at 6.1.  She is not on any medications.  She has an elevated LDL, but brings in her atorvastatin and states she is taking this today.  Renal function was checked today. She notes that she is seeing her eye doctor soon and needs a check up.    Plan Continue diet control MAU/Cr with elevated albumin, consider ARB at next visit Will add zetia to her regimen.

## 2021-07-17 NOTE — Assessment & Plan Note (Signed)
She is up to date on mammogram.  Colon done by stool testing in 2018.  She has pelvic organ prolapse so pelvic exam will be done at next visit.

## 2021-07-17 NOTE — Assessment & Plan Note (Addendum)
LDL remains high on recheck today.   Plan to add zetia to her regimen.  She brought in her pill bottles today and states that she is taking them.

## 2021-07-17 NOTE — Assessment & Plan Note (Signed)
Last DEXA showed progression to osteoporosis.  She was started on fosamax and is taking this appropriately.  We discussed the medication and she will continue it.

## 2021-11-15 ENCOUNTER — Encounter: Payer: Self-pay | Admitting: Internal Medicine

## 2021-11-15 ENCOUNTER — Ambulatory Visit (INDEPENDENT_AMBULATORY_CARE_PROVIDER_SITE_OTHER): Payer: Medicare HMO | Admitting: Internal Medicine

## 2021-11-15 ENCOUNTER — Other Ambulatory Visit: Payer: Self-pay

## 2021-11-15 VITALS — BP 146/64 | HR 77 | Temp 98.3°F | Ht 63.0 in | Wt 124.0 lb

## 2021-11-15 DIAGNOSIS — N181 Chronic kidney disease, stage 1: Secondary | ICD-10-CM | POA: Diagnosis not present

## 2021-11-15 DIAGNOSIS — D631 Anemia in chronic kidney disease: Secondary | ICD-10-CM | POA: Diagnosis not present

## 2021-11-15 DIAGNOSIS — Z87891 Personal history of nicotine dependence: Secondary | ICD-10-CM | POA: Diagnosis not present

## 2021-11-15 DIAGNOSIS — H35033 Hypertensive retinopathy, bilateral: Secondary | ICD-10-CM

## 2021-11-15 DIAGNOSIS — E1122 Type 2 diabetes mellitus with diabetic chronic kidney disease: Secondary | ICD-10-CM

## 2021-11-15 DIAGNOSIS — Z23 Encounter for immunization: Secondary | ICD-10-CM

## 2021-11-15 DIAGNOSIS — N812 Incomplete uterovaginal prolapse: Secondary | ICD-10-CM

## 2021-11-15 DIAGNOSIS — E781 Pure hyperglyceridemia: Secondary | ICD-10-CM | POA: Diagnosis not present

## 2021-11-15 DIAGNOSIS — I129 Hypertensive chronic kidney disease with stage 1 through stage 4 chronic kidney disease, or unspecified chronic kidney disease: Secondary | ICD-10-CM | POA: Diagnosis not present

## 2021-11-15 DIAGNOSIS — I10 Essential (primary) hypertension: Secondary | ICD-10-CM

## 2021-11-15 MED ORDER — AMLODIPINE BESYLATE 5 MG PO TABS: 5.0000 mg | ORAL_TABLET | Freq: Every day | ORAL | 1 refills | Status: DC

## 2021-11-15 MED ORDER — EZETIMIBE 10 MG PO TABS: 10.0000 mg | ORAL_TABLET | Freq: Every day | ORAL | 1 refills | Status: DC

## 2021-11-15 MED ORDER — LOSARTAN POTASSIUM 25 MG PO TABS: 25.0000 mg | ORAL_TABLET | Freq: Every day | ORAL | 1 refills | Status: DC

## 2021-11-15 NOTE — Assessment & Plan Note (Signed)
Based on USPSTF guidelines, will provide PCV 20 today.

## 2021-11-15 NOTE — Assessment & Plan Note (Signed)
Iron levels at last check normal.  Have advised her that she can stop iron supplementation at this time.

## 2021-11-15 NOTE — Assessment & Plan Note (Signed)
BP is mildly elevated today.  She has not taken her blood pressure medication yet today.  She has increased microalbuminuria.  She also notes some swelling in the legs, though I think this is more likely to being on her feet at work daily.  She has no headache, change in vision, chest pain.   Plan Decrease amlodipine to 5mg  daily -- this may help with swelling Start Losartan 25mg  daily Follow up BMET in 2-3 months

## 2021-11-15 NOTE — Progress Notes (Signed)
   Subjective:    Patient ID: Gina Kelley, female    DOB: [birth year], 86 y.o.   MRN: [id number]  3 month follow up for DM2  HPI  Gina Kelley is an 86 year old woman with PMH of diet controlled DM, HLD, Anemia, HTN with retinopathy, GERD, osteoporosis who presents for follow up.   Her blood sugars have been well controlled.  She has noted that she "doesn't have diabetes anymore" and we explained that she does, just well controlled.   Gina Kelley reports that she is doing well otherwise.  She notes that her prolapse is doing fine.  She has an issue when she has a bowel movement or picks up something heavy.  She has no pain, she has easy return of prolapse and no bleeding.  She would prefer to defer pelvic exam until next visit.    She is due for pneumonia vaccine booster.  Reviewed USPSTF guidelines and she would be due, based on our records, for PCV 20.  We will provide this today.    Review of Systems  Constitutional:  Negative for activity change, appetite change, fatigue and fever.  Respiratory:  Negative for cough, choking and shortness of breath.   Cardiovascular:  Positive for leg swelling (after standing up all day, works at [employer]). Negative for chest pain.  Gastrointestinal:  Positive for constipation (bowel movement every 3rd day, increased water helps). Negative for abdominal distention, anal bleeding, diarrhea and nausea.  Genitourinary:  Negative for difficulty urinating, dysuria, genital sores, menstrual problem, pelvic pain, vaginal bleeding, vaginal discharge and vaginal pain.  Neurological:  Negative for weakness.  Psychiatric/Behavioral:  Negative for decreased concentration and dysphoric mood.       Objective:   Physical Exam Vitals and nursing note reviewed.  Constitutional:      Appearance: Normal appearance.  HENT:     Head: Normocephalic and atraumatic.     Mouth/Throat:     Mouth: Mucous membranes are moist.  Eyes:     General: No scleral icterus.        Right eye: No discharge.        Left eye: No discharge.     Conjunctiva/sclera: Conjunctivae normal.  Cardiovascular:     Rate and Rhythm: Normal rate and regular rhythm.     Heart sounds: No murmur heard. Pulmonary:     Effort: Pulmonary effort is normal. No respiratory distress.     Breath sounds: Normal breath sounds. No wheezing.  Abdominal:     General: Abdomen is flat.     Palpations: Abdomen is soft.  Musculoskeletal:        General: Swelling (mild, non pitting, she is wearing stockings) present.  Skin:    General: Skin is warm and dry.     Coloration: Skin is not jaundiced.  Neurological:     General: No focal deficit present.     Mental Status: She is alert and oriented to person, place, and time.  Psychiatric:        Mood and Affect: Mood normal.        Behavior: Behavior normal.    No labs needed today.       Assessment & Plan:  RTC in 6 months

## 2021-11-15 NOTE — Patient Instructions (Signed)
Gina Kelley - -  Thank you for coming in today!  You are doing well.   Please STOP iron supplement.   Please START Zetia (ezetimibe) for your cholesterol along with your atorvastatin (take both)  Please START losartan 25mg  daily for your blood pressure and kidneys.   Please DECREASE your amlodipine to 5mg  daily, hopefully this will help with your swelling in the legs.   THank you!  Come back in 6 months

## 2021-11-15 NOTE — Assessment & Plan Note (Signed)
She again declined pelvic exam.  She notes that she will be amenable at next visit.  She does not have any red flag symptoms at this time and I reviewed these with her.   Plan Pelvic exam at next visit.

## 2021-11-15 NOTE — Assessment & Plan Note (Signed)
She is currently diet controlled.  We will check her A1C about every 6 months.  Her cholesterol is not controlled (148) at last check.  We discussed starting Zetia and she is amenable.   Plan Continue atorvastatin Start Zetia Repeat Lipid profile in 3-6 months

## 2021-11-15 NOTE — Assessment & Plan Note (Signed)
She is due for eye exam coming up this year.  We will encourage her to schedule.

## 2021-11-15 NOTE — Assessment & Plan Note (Signed)
LDL elevated, will continue atorvastatin and start zetia as noted above.

## 2021-12-19 ENCOUNTER — Other Ambulatory Visit: Payer: Self-pay | Admitting: Student

## 2021-12-19 DIAGNOSIS — M810 Age-related osteoporosis without current pathological fracture: Secondary | ICD-10-CM

## 2021-12-20 ENCOUNTER — Encounter: Payer: Medicare HMO | Admitting: Internal Medicine

## 2021-12-30 DIAGNOSIS — H5213 Myopia, bilateral: Secondary | ICD-10-CM | POA: Diagnosis not present

## 2022-01-09 DIAGNOSIS — H353231 Exudative age-related macular degeneration, bilateral, with active choroidal neovascularization: Secondary | ICD-10-CM | POA: Diagnosis not present

## 2022-01-15 DIAGNOSIS — H353211 Exudative age-related macular degeneration, right eye, with active choroidal neovascularization: Secondary | ICD-10-CM | POA: Diagnosis not present

## 2022-01-15 DIAGNOSIS — H353231 Exudative age-related macular degeneration, bilateral, with active choroidal neovascularization: Secondary | ICD-10-CM | POA: Diagnosis not present

## 2022-01-15 DIAGNOSIS — H353221 Exudative age-related macular degeneration, left eye, with active choroidal neovascularization: Secondary | ICD-10-CM | POA: Diagnosis not present

## 2022-02-03 ENCOUNTER — Other Ambulatory Visit: Payer: Self-pay | Admitting: Internal Medicine

## 2022-02-03 DIAGNOSIS — Z1231 Encounter for screening mammogram for malignant neoplasm of breast: Secondary | ICD-10-CM

## 2022-02-12 DIAGNOSIS — H353211 Exudative age-related macular degeneration, right eye, with active choroidal neovascularization: Secondary | ICD-10-CM | POA: Diagnosis not present

## 2022-02-28 ENCOUNTER — Ambulatory Visit: Payer: Medicare HMO

## 2022-03-12 DIAGNOSIS — H353211 Exudative age-related macular degeneration, right eye, with active choroidal neovascularization: Secondary | ICD-10-CM | POA: Diagnosis not present

## 2022-03-26 ENCOUNTER — Ambulatory Visit
Admission: RE | Admit: 2022-03-26 | Discharge: 2022-03-26 | Disposition: A | Discharge status: Home / Self Care | Payer: Medicare HMO | Source: Ambulatory Visit | Attending: Internal Medicine | Admitting: Internal Medicine

## 2022-03-26 DIAGNOSIS — Z1231 Encounter for screening mammogram for malignant neoplasm of breast: Secondary | ICD-10-CM

## 2022-04-09 DIAGNOSIS — H353211 Exudative age-related macular degeneration, right eye, with active choroidal neovascularization: Secondary | ICD-10-CM | POA: Diagnosis not present

## 2022-05-09 DIAGNOSIS — H353222 Exudative age-related macular degeneration, left eye, with inactive choroidal neovascularization: Secondary | ICD-10-CM | POA: Diagnosis not present

## 2022-05-09 DIAGNOSIS — H353211 Exudative age-related macular degeneration, right eye, with active choroidal neovascularization: Secondary | ICD-10-CM | POA: Diagnosis not present

## 2022-05-13 ENCOUNTER — Other Ambulatory Visit: Payer: Self-pay | Admitting: Internal Medicine

## 2022-05-13 DIAGNOSIS — E781 Pure hyperglyceridemia: Secondary | ICD-10-CM

## 2022-05-13 DIAGNOSIS — E1122 Type 2 diabetes mellitus with diabetic chronic kidney disease: Secondary | ICD-10-CM

## 2022-05-13 DIAGNOSIS — I10 Essential (primary) hypertension: Secondary | ICD-10-CM

## 2022-05-14 IMAGING — MG DIGITAL SCREENING BILAT W/ TOMO W/ CAD
6 of 10 series · 6 of 30 positions shown · non-contrast
Comparison: Previous exam(s).

CLINICAL DATA: Screening.

EXAM:
DIGITAL SCREENING BILATERAL MAMMOGRAM WITH TOMO AND CAD

[L MLO synth-2D (1 of 2)]
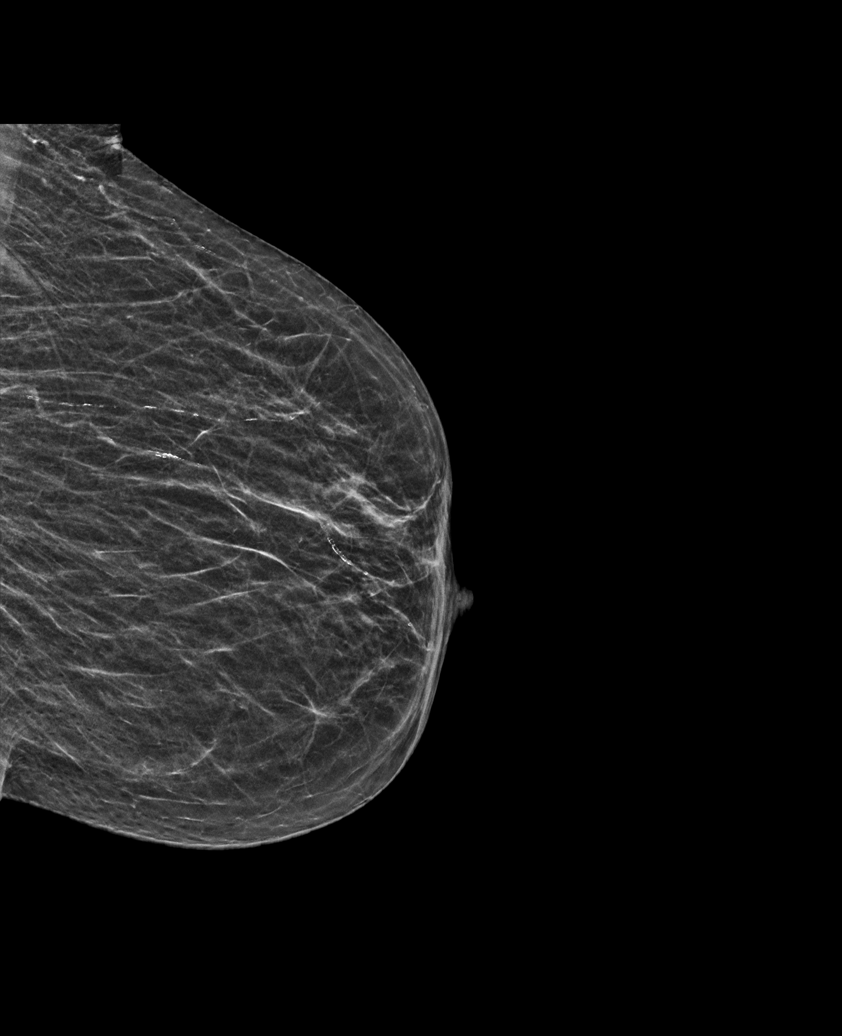

[R CC synth-2D]
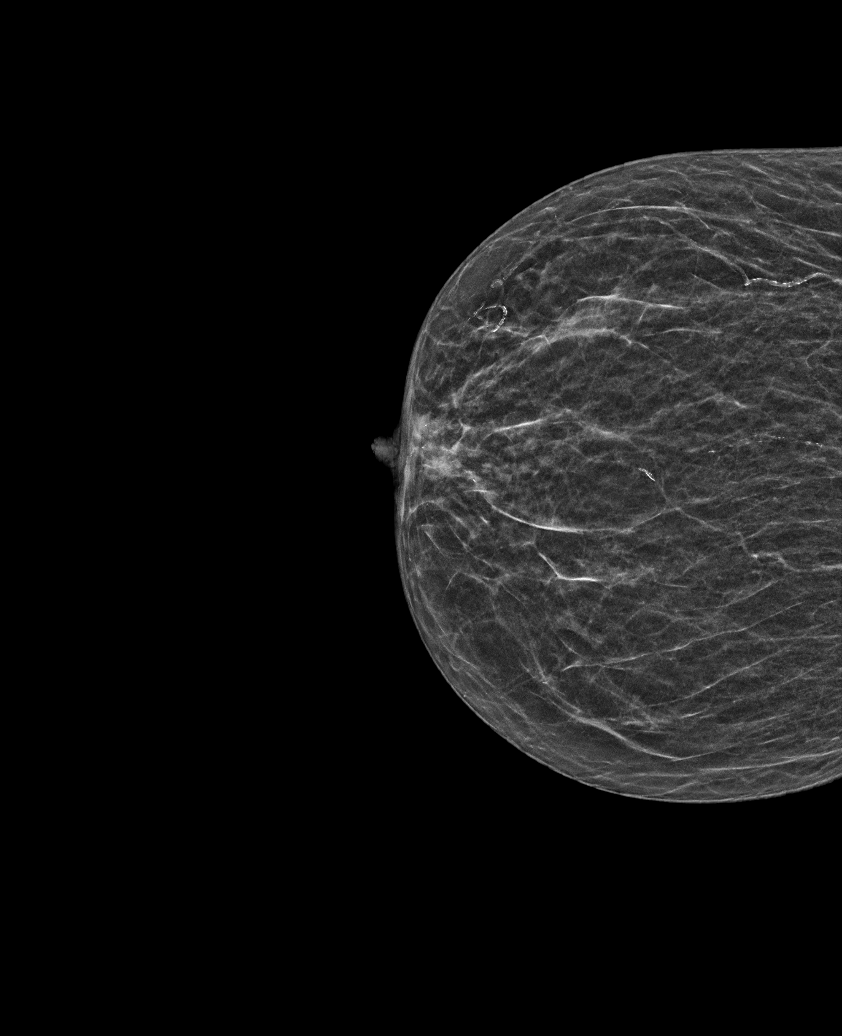

[L CC synth-2D]
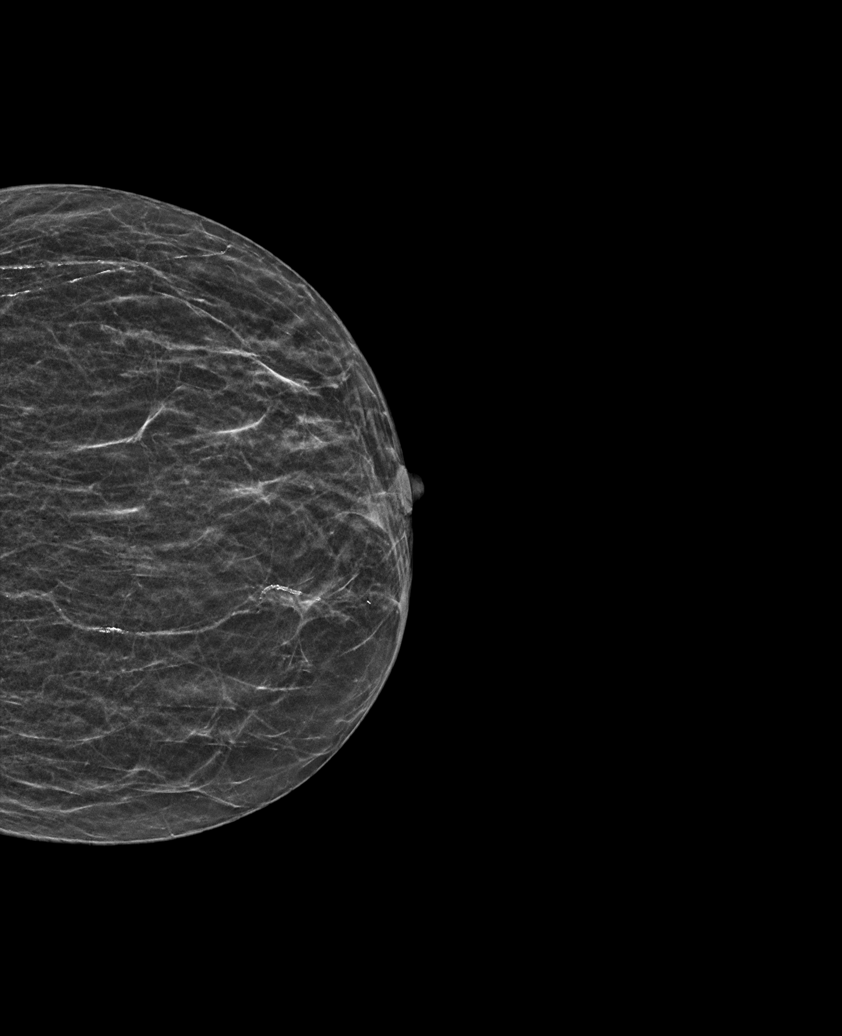

[R MLO synth-2D]
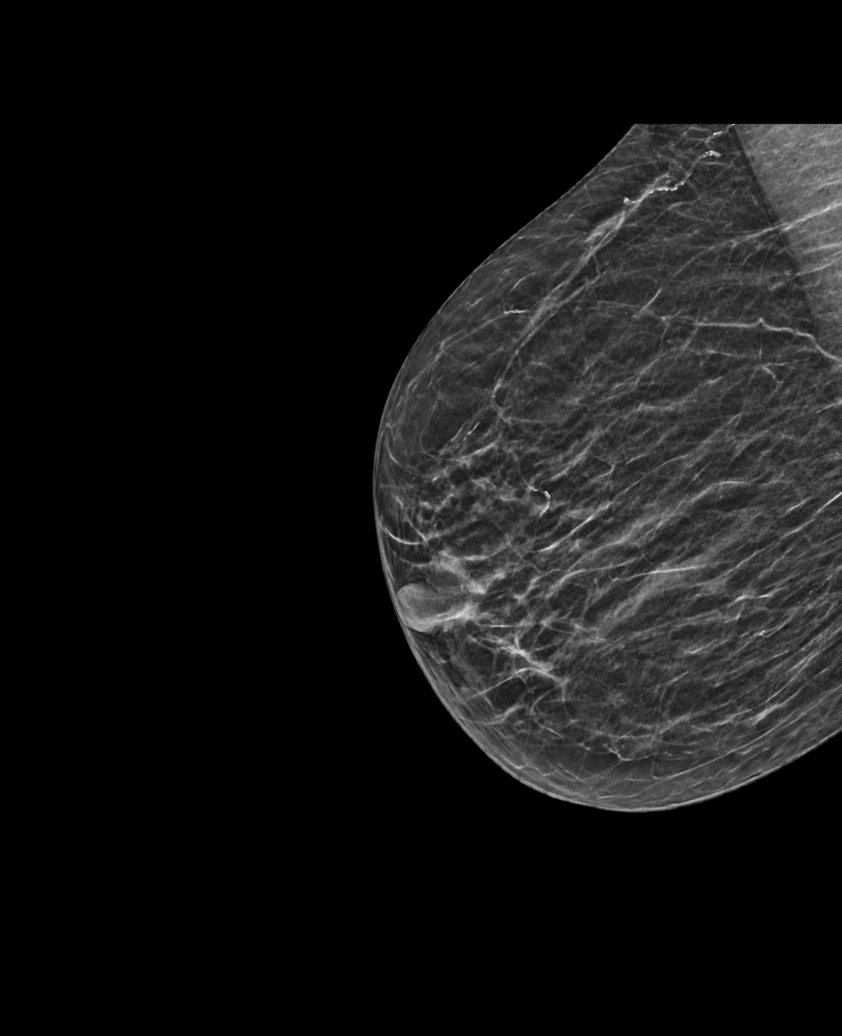

[L MLO synth-2D (2 of 2)]
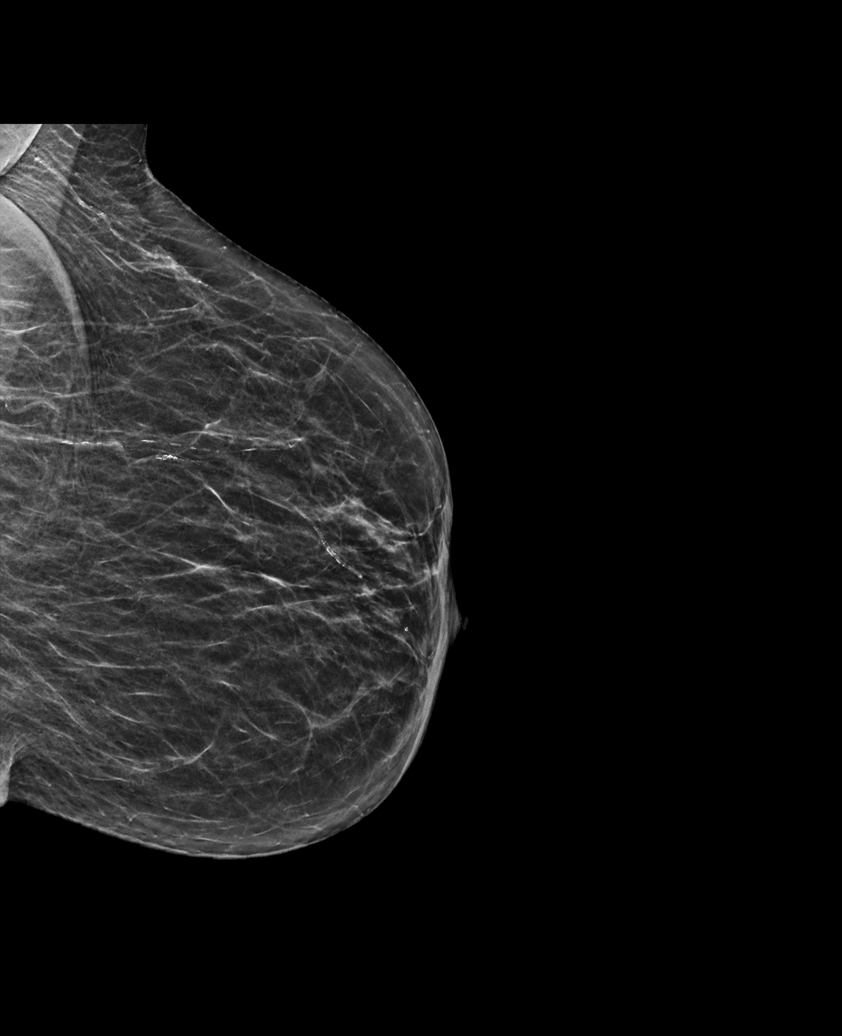

[L MLO tomo · tomo slice 23/45.0]
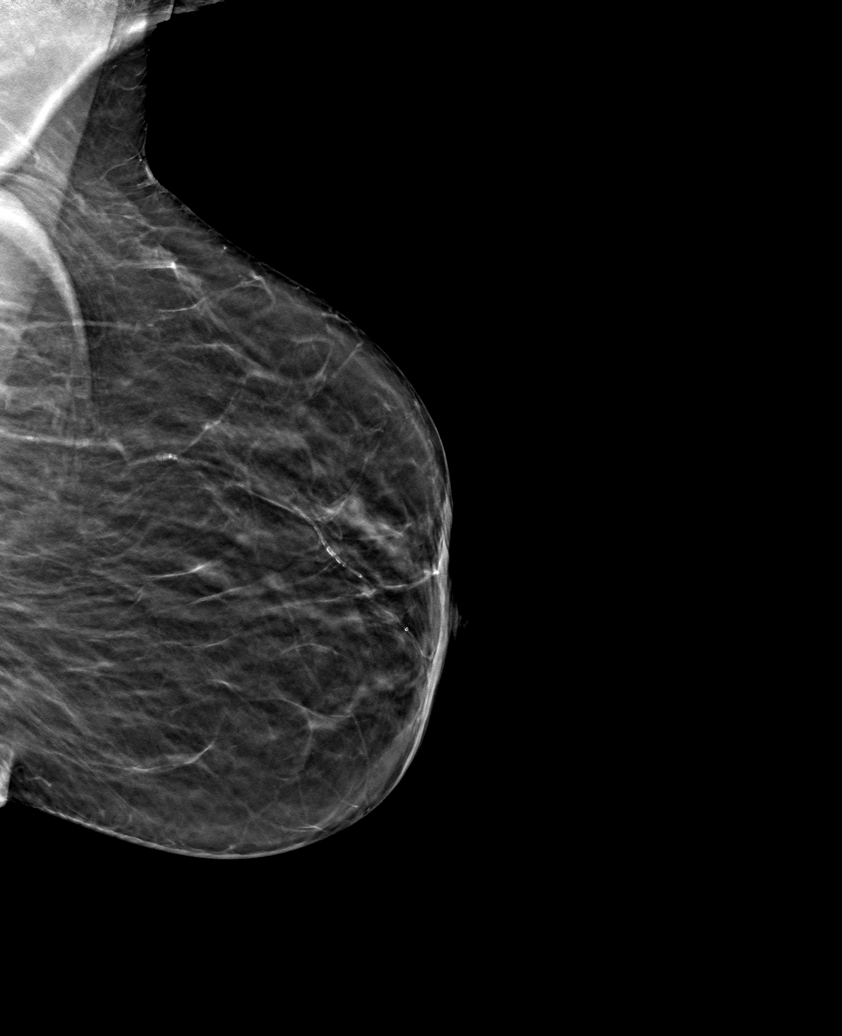

[6 of 30 positions shown; findings below may reference images not displayed]

ACR Breast Density Category b: There are scattered areas of
fibroglandular density.
FINDINGS: There are no findings suspicious for malignancy. Images were
processed with CAD.
IMPRESSION: No mammographic evidence of malignancy. A result letter of this
screening mammogram will be mailed directly to the patient.

RECOMMENDATION:
Screening mammogram in one year. (Code:[id number])

BI-RADS CATEGORY  1: Negative.

## 2022-07-02 DIAGNOSIS — H353211 Exudative age-related macular degeneration, right eye, with active choroidal neovascularization: Secondary | ICD-10-CM | POA: Diagnosis not present

## 2022-07-08 ENCOUNTER — Other Ambulatory Visit: Payer: Self-pay

## 2022-07-08 ENCOUNTER — Emergency Department (HOSPITAL_COMMUNITY): Payer: Medicare HMO

## 2022-07-08 ENCOUNTER — Emergency Department (HOSPITAL_COMMUNITY): Payer: Medicare HMO | Admitting: Anesthesiology

## 2022-07-08 ENCOUNTER — Emergency Department (HOSPITAL_COMMUNITY): Admission: EM | Admit: 2022-07-08 | Payer: Self-pay | Source: Home / Self Care

## 2022-07-08 ENCOUNTER — Encounter (HOSPITAL_COMMUNITY): Payer: Self-pay

## 2022-07-08 ENCOUNTER — Encounter (HOSPITAL_COMMUNITY)
Admission: EM | Disposition: A | Discharge status: Rehabilitation Facility | Payer: Self-pay | Source: Home / Self Care | Attending: Neurosurgery

## 2022-07-08 ENCOUNTER — Inpatient Hospital Stay (HOSPITAL_COMMUNITY)
Admission: EM | Admit: 2022-07-08 | Discharge: 2022-08-02 | DRG: 023 | Disposition: A | Discharge status: Rehabilitation Facility | Payer: Medicare HMO | Attending: Neurosurgery | Admitting: Neurosurgery

## 2022-07-08 DIAGNOSIS — I82612 Acute embolism and thrombosis of superficial veins of left upper extremity: Secondary | ICD-10-CM | POA: Diagnosis not present

## 2022-07-08 DIAGNOSIS — E785 Hyperlipidemia, unspecified: Secondary | ICD-10-CM | POA: Diagnosis present

## 2022-07-08 DIAGNOSIS — I619 Nontraumatic intracerebral hemorrhage, unspecified: Secondary | ICD-10-CM | POA: Diagnosis present

## 2022-07-08 DIAGNOSIS — R29810 Facial weakness: Secondary | ICD-10-CM | POA: Diagnosis not present

## 2022-07-08 DIAGNOSIS — Z823 Family history of stroke: Secondary | ICD-10-CM

## 2022-07-08 DIAGNOSIS — K219 Gastro-esophageal reflux disease without esophagitis: Secondary | ICD-10-CM | POA: Diagnosis present

## 2022-07-08 DIAGNOSIS — R4701 Aphasia: Secondary | ICD-10-CM | POA: Diagnosis present

## 2022-07-08 DIAGNOSIS — M25561 Pain in right knee: Secondary | ICD-10-CM | POA: Diagnosis not present

## 2022-07-08 DIAGNOSIS — E1122 Type 2 diabetes mellitus with diabetic chronic kidney disease: Secondary | ICD-10-CM | POA: Diagnosis not present

## 2022-07-08 DIAGNOSIS — I611 Nontraumatic intracerebral hemorrhage in hemisphere, cortical: Secondary | ICD-10-CM | POA: Diagnosis not present

## 2022-07-08 DIAGNOSIS — R531 Weakness: Secondary | ICD-10-CM | POA: Diagnosis not present

## 2022-07-08 DIAGNOSIS — M7989 Other specified soft tissue disorders: Secondary | ICD-10-CM | POA: Diagnosis not present

## 2022-07-08 DIAGNOSIS — R1310 Dysphagia, unspecified: Secondary | ICD-10-CM | POA: Diagnosis present

## 2022-07-08 DIAGNOSIS — L89152 Pressure ulcer of sacral region, stage 2: Secondary | ICD-10-CM | POA: Diagnosis not present

## 2022-07-08 DIAGNOSIS — S065XAA Traumatic subdural hemorrhage with loss of consciousness status unknown, initial encounter: Secondary | ICD-10-CM | POA: Diagnosis not present

## 2022-07-08 DIAGNOSIS — I6200 Nontraumatic subdural hemorrhage, unspecified: Secondary | ICD-10-CM | POA: Diagnosis not present

## 2022-07-08 DIAGNOSIS — G9389 Other specified disorders of brain: Secondary | ICD-10-CM | POA: Diagnosis not present

## 2022-07-08 DIAGNOSIS — Z4682 Encounter for fitting and adjustment of non-vascular catheter: Secondary | ICD-10-CM | POA: Diagnosis not present

## 2022-07-08 DIAGNOSIS — Z9889 Other specified postprocedural states: Secondary | ICD-10-CM | POA: Diagnosis not present

## 2022-07-08 DIAGNOSIS — E440 Moderate protein-calorie malnutrition: Secondary | ICD-10-CM | POA: Insufficient documentation

## 2022-07-08 DIAGNOSIS — Z9911 Dependence on respirator [ventilator] status: Secondary | ICD-10-CM

## 2022-07-08 DIAGNOSIS — J9690 Respiratory failure, unspecified, unspecified whether with hypoxia or hypercapnia: Secondary | ICD-10-CM | POA: Diagnosis not present

## 2022-07-08 DIAGNOSIS — E1165 Type 2 diabetes mellitus with hyperglycemia: Secondary | ICD-10-CM | POA: Diagnosis not present

## 2022-07-08 DIAGNOSIS — G936 Cerebral edema: Secondary | ICD-10-CM | POA: Diagnosis not present

## 2022-07-08 DIAGNOSIS — Z7189 Other specified counseling: Secondary | ICD-10-CM | POA: Diagnosis not present

## 2022-07-08 DIAGNOSIS — I6201 Nontraumatic acute subdural hemorrhage: Secondary | ICD-10-CM | POA: Diagnosis not present

## 2022-07-08 DIAGNOSIS — N181 Chronic kidney disease, stage 1: Secondary | ICD-10-CM | POA: Diagnosis present

## 2022-07-08 DIAGNOSIS — R29742 NIHSS score 42: Secondary | ICD-10-CM | POA: Diagnosis present

## 2022-07-08 DIAGNOSIS — D500 Iron deficiency anemia secondary to blood loss (chronic): Secondary | ICD-10-CM | POA: Diagnosis not present

## 2022-07-08 DIAGNOSIS — Z66 Do not resuscitate: Secondary | ICD-10-CM | POA: Diagnosis not present

## 2022-07-08 DIAGNOSIS — I6381 Other cerebral infarction due to occlusion or stenosis of small artery: Secondary | ICD-10-CM | POA: Diagnosis not present

## 2022-07-08 DIAGNOSIS — D649 Anemia, unspecified: Secondary | ICD-10-CM | POA: Diagnosis present

## 2022-07-08 DIAGNOSIS — R1312 Dysphagia, oropharyngeal phase: Secondary | ICD-10-CM | POA: Diagnosis not present

## 2022-07-08 DIAGNOSIS — J42 Unspecified chronic bronchitis: Secondary | ICD-10-CM | POA: Diagnosis not present

## 2022-07-08 DIAGNOSIS — Z87891 Personal history of nicotine dependence: Secondary | ICD-10-CM | POA: Diagnosis not present

## 2022-07-08 DIAGNOSIS — R6339 Other feeding difficulties: Secondary | ICD-10-CM | POA: Diagnosis not present

## 2022-07-08 DIAGNOSIS — E11649 Type 2 diabetes mellitus with hypoglycemia without coma: Secondary | ICD-10-CM | POA: Diagnosis not present

## 2022-07-08 DIAGNOSIS — Z4659 Encounter for fitting and adjustment of other gastrointestinal appliance and device: Secondary | ICD-10-CM | POA: Diagnosis not present

## 2022-07-08 DIAGNOSIS — R4182 Altered mental status, unspecified: Secondary | ICD-10-CM | POA: Diagnosis not present

## 2022-07-08 DIAGNOSIS — L8990 Pressure ulcer of unspecified site, unspecified stage: Secondary | ICD-10-CM | POA: Insufficient documentation

## 2022-07-08 DIAGNOSIS — Z833 Family history of diabetes mellitus: Secondary | ICD-10-CM

## 2022-07-08 DIAGNOSIS — E119 Type 2 diabetes mellitus without complications: Secondary | ICD-10-CM | POA: Diagnosis not present

## 2022-07-08 DIAGNOSIS — Z6821 Body mass index (BMI) 21.0-21.9, adult: Secondary | ICD-10-CM

## 2022-07-08 DIAGNOSIS — E876 Hypokalemia: Secondary | ICD-10-CM | POA: Diagnosis not present

## 2022-07-08 DIAGNOSIS — J9602 Acute respiratory failure with hypercapnia: Secondary | ICD-10-CM | POA: Diagnosis not present

## 2022-07-08 DIAGNOSIS — Z79899 Other long term (current) drug therapy: Secondary | ICD-10-CM

## 2022-07-08 DIAGNOSIS — I129 Hypertensive chronic kidney disease with stage 1 through stage 4 chronic kidney disease, or unspecified chronic kidney disease: Secondary | ICD-10-CM | POA: Diagnosis present

## 2022-07-08 DIAGNOSIS — G935 Compression of brain: Secondary | ICD-10-CM | POA: Diagnosis present

## 2022-07-08 DIAGNOSIS — I618 Other nontraumatic intracerebral hemorrhage: Secondary | ICD-10-CM | POA: Diagnosis not present

## 2022-07-08 DIAGNOSIS — R339 Retention of urine, unspecified: Secondary | ICD-10-CM | POA: Diagnosis not present

## 2022-07-08 DIAGNOSIS — M85851 Other specified disorders of bone density and structure, right thigh: Secondary | ICD-10-CM | POA: Diagnosis present

## 2022-07-08 DIAGNOSIS — I69254 Hemiplegia and hemiparesis following other nontraumatic intracranial hemorrhage affecting left non-dominant side: Secondary | ICD-10-CM

## 2022-07-08 DIAGNOSIS — D631 Anemia in chronic kidney disease: Secondary | ICD-10-CM | POA: Diagnosis not present

## 2022-07-08 DIAGNOSIS — Z8711 Personal history of peptic ulcer disease: Secondary | ICD-10-CM

## 2022-07-08 DIAGNOSIS — I700 Atherosclerosis of aorta: Secondary | ICD-10-CM | POA: Diagnosis not present

## 2022-07-08 DIAGNOSIS — I10 Essential (primary) hypertension: Secondary | ICD-10-CM

## 2022-07-08 DIAGNOSIS — K5900 Constipation, unspecified: Secondary | ICD-10-CM | POA: Diagnosis not present

## 2022-07-08 DIAGNOSIS — J9601 Acute respiratory failure with hypoxia: Secondary | ICD-10-CM | POA: Diagnosis not present

## 2022-07-08 DIAGNOSIS — Z515 Encounter for palliative care: Secondary | ICD-10-CM

## 2022-07-08 DIAGNOSIS — Z8249 Family history of ischemic heart disease and other diseases of the circulatory system: Secondary | ICD-10-CM

## 2022-07-08 DIAGNOSIS — I639 Cerebral infarction, unspecified: Secondary | ICD-10-CM | POA: Diagnosis not present

## 2022-07-08 DIAGNOSIS — R000 Tachycardia, unspecified: Secondary | ICD-10-CM | POA: Diagnosis not present

## 2022-07-08 DIAGNOSIS — Z781 Physical restraint status: Secondary | ICD-10-CM

## 2022-07-08 DIAGNOSIS — D72829 Elevated white blood cell count, unspecified: Secondary | ICD-10-CM | POA: Diagnosis not present

## 2022-07-08 DIAGNOSIS — Z888 Allergy status to other drugs, medicaments and biological substances status: Secondary | ICD-10-CM

## 2022-07-08 DIAGNOSIS — R918 Other nonspecific abnormal finding of lung field: Secondary | ICD-10-CM | POA: Diagnosis not present

## 2022-07-08 DIAGNOSIS — Z7982 Long term (current) use of aspirin: Secondary | ICD-10-CM

## 2022-07-08 DIAGNOSIS — R414 Neurologic neglect syndrome: Secondary | ICD-10-CM | POA: Diagnosis not present

## 2022-07-08 DIAGNOSIS — K591 Functional diarrhea: Secondary | ICD-10-CM | POA: Diagnosis not present

## 2022-07-08 HISTORY — PX: CRANIOTOMY: SHX93

## 2022-07-08 LAB — I-STAT CHEM 8, ED
BUN: 23 mg/dL (ref 8–23)
Calcium, Ion: 1.19 mmol/L (ref 1.15–1.40)
Chloride: 102 mmol/L (ref 98–111)
Creatinine, Ser: 0.6 mg/dL (ref 0.44–1.00)
Glucose, Bld: 158 mg/dL — ABNORMAL HIGH (ref 70–99)
HCT: 34 % — ABNORMAL LOW (ref 36.0–46.0)
Hemoglobin: 11.6 g/dL — ABNORMAL LOW (ref 12.0–15.0)
Potassium: 3.3 mmol/L — ABNORMAL LOW (ref 3.5–5.1)
Sodium: 141 mmol/L (ref 135–145)
TCO2: 28 mmol/L (ref 22–32)

## 2022-07-08 LAB — COMPREHENSIVE METABOLIC PANEL
ALT: 21 U/L (ref 0–44)
AST: 38 U/L (ref 15–41)
Albumin: 3.6 g/dL (ref 3.5–5.0)
Alkaline Phosphatase: 60 U/L (ref 38–126)
Anion gap: 7 (ref 5–15)
BUN: 19 mg/dL (ref 8–23)
CO2: 26 mmol/L (ref 22–32)
Calcium: 9.1 mg/dL (ref 8.9–10.3)
Chloride: 104 mmol/L (ref 98–111)
Creatinine, Ser: 0.71 mg/dL (ref 0.44–1.00)
GFR, Estimated: >60 mL/min (ref >60)
Glucose, Bld: 156 mg/dL — ABNORMAL HIGH (ref 70–99)
Potassium: 3.2 mmol/L — ABNORMAL LOW (ref 3.5–5.1)
Sodium: 137 mmol/L (ref 135–145)
Total Bilirubin: 0.6 mg/dL (ref 0.3–1.2)
Total Protein: 7.5 g/dL (ref 6.5–8.1)

## 2022-07-08 LAB — PROTIME-INR
INR: 1.1 (ref 0.8–1.2)
Prothrombin Time: 13.6 seconds (ref 11.4–15.2)

## 2022-07-08 LAB — DIFFERENTIAL
Abs Immature Granulocytes: 0.01 10*3/uL (ref 0.00–0.07)
Basophils Absolute: 0 10*3/uL (ref 0.0–0.1)
Basophils Relative: 1 %
Eosinophils Absolute: 0.1 10*3/uL (ref 0.0–0.5)
Eosinophils Relative: 2 %
Immature Granulocytes: 0 %
Lymphocytes Relative: 41 %
Lymphs Abs: 3.1 10*3/uL (ref 0.7–4.0)
Monocytes Absolute: 0.7 10*3/uL (ref 0.1–1.0)
Monocytes Relative: 10 %
Neutro Abs: 3.5 10*3/uL (ref 1.7–7.7)
Neutrophils Relative %: 46 %

## 2022-07-08 LAB — ABO/RH: ABO/RH(D): O POS

## 2022-07-08 LAB — CBC
HCT: 31.3 % — ABNORMAL LOW (ref 36.0–46.0)
Hemoglobin: 10.4 g/dL — ABNORMAL LOW (ref 12.0–15.0)
MCH: 30.2 pg (ref 26.0–34.0)
MCHC: 33.2 g/dL (ref 30.0–36.0)
MCV: 91 fL (ref 80.0–100.0)
Platelets: 256 10*3/uL (ref 150–400)
RBC: 3.44 MIL/uL — ABNORMAL LOW (ref 3.87–5.11)
RDW: 15.3 % (ref 11.5–15.5)
WBC: 7.5 10*3/uL (ref 4.0–10.5)
nRBC: 0 % (ref 0.0–0.2)

## 2022-07-08 LAB — URINALYSIS, ROUTINE W REFLEX MICROSCOPIC
Bilirubin Urine: NEGATIVE
Glucose, UA: NEGATIVE mg/dL
Hgb urine dipstick: NEGATIVE
Ketones, ur: 5 mg/dL — AB
Leukocytes,Ua: NEGATIVE
Nitrite: POSITIVE — AB
Protein, ur: 30 mg/dL — AB
Specific Gravity, Urine: 1.009 (ref 1.005–1.030)
pH: 6 (ref 5.0–8.0)

## 2022-07-08 LAB — RAPID URINE DRUG SCREEN, HOSP PERFORMED
Amphetamines: NOT DETECTED
Barbiturates: NOT DETECTED
Benzodiazepines: NOT DETECTED
Cocaine: NOT DETECTED
Opiates: NOT DETECTED
Tetrahydrocannabinol: NOT DETECTED

## 2022-07-08 LAB — CBG MONITORING, ED: Glucose-Capillary: 153 mg/dL — ABNORMAL HIGH (ref 70–99)

## 2022-07-08 LAB — I-STAT ARTERIAL BLOOD GAS, ED
Acid-Base Excess: 3 mmol/L — ABNORMAL HIGH (ref 0.0–2.0)
Bicarbonate: 28.7 mmol/L — ABNORMAL HIGH (ref 20.0–28.0)
Calcium, Ion: 1.22 mmol/L (ref 1.15–1.40)
HCT: 28 % — ABNORMAL LOW (ref 36.0–46.0)
Hemoglobin: 9.5 g/dL — ABNORMAL LOW (ref 12.0–15.0)
O2 Saturation: 100 %
Patient temperature: 97.3
Potassium: 3.2 mmol/L — ABNORMAL LOW (ref 3.5–5.1)
Sodium: 139 mmol/L (ref 135–145)
TCO2: 30 mmol/L (ref 22–32)
pCO2 arterial: 44.1 mmHg (ref 32–48)
pH, Arterial: 7.418 (ref 7.35–7.45)
pO2, Arterial: 528 mmHg — ABNORMAL HIGH (ref 83–108)

## 2022-07-08 LAB — TYPE AND SCREEN
ABO/RH(D): O POS
Antibody Screen: NEGATIVE

## 2022-07-08 LAB — APTT: aPTT: 25 seconds (ref 24–36)

## 2022-07-08 LAB — MRSA NEXT GEN BY PCR, NASAL: MRSA by PCR Next Gen: NOT DETECTED

## 2022-07-08 LAB — POCT I-STAT 7, (LYTES, BLD GAS, ICA,H+H)
Acid-Base Excess: 0 mmol/L (ref 0.0–2.0)
Bicarbonate: 24.3 mmol/L (ref 20.0–28.0)
Calcium, Ion: 1.17 mmol/L (ref 1.15–1.40)
HCT: 25 % — ABNORMAL LOW (ref 36.0–46.0)
Hemoglobin: 8.5 g/dL — ABNORMAL LOW (ref 12.0–15.0)
O2 Saturation: 100 %
Patient temperature: 98.6
Potassium: 2.8 mmol/L — ABNORMAL LOW (ref 3.5–5.1)
Sodium: 144 mmol/L (ref 135–145)
TCO2: 25 mmol/L (ref 22–32)
pCO2 arterial: 38.5 mmHg (ref 32–48)
pH, Arterial: 7.409 (ref 7.35–7.45)
pO2, Arterial: 183 mmHg — ABNORMAL HIGH (ref 83–108)

## 2022-07-08 LAB — ETHANOL: Alcohol, Ethyl (B): <10 mg/dL (ref <10)

## 2022-07-08 SURGERY — CRANIOTOMY HEMATOMA EVACUATION SUBDURAL
Anesthesia: General | Laterality: Right

## 2022-07-08 MED ORDER — SODIUM CHLORIDE 0.9 % IV SOLN: INTRAVENOUS | Status: DC | PRN

## 2022-07-08 MED ORDER — PHENYLEPHRINE 80 MCG/ML (10ML) SYRINGE FOR IV PUSH (FOR BLOOD PRESSURE SUPPORT)
PREFILLED_SYRINGE | INTRAVENOUS | Status: DC | PRN
  Administered 2022-07-08: 80 ug via INTRAVENOUS

## 2022-07-08 MED ORDER — THROMBIN 20000 UNITS EX SOLR
CUTANEOUS | Status: DC | PRN
  Administered 2022-07-08: 20 mL via TOPICAL

## 2022-07-08 MED ORDER — FENTANYL CITRATE PF 50 MCG/ML IJ SOSY: 25.0000 ug | PREFILLED_SYRINGE | Freq: Once | INTRAMUSCULAR | Status: DC

## 2022-07-08 MED ORDER — ORAL CARE MOUTH RINSE: 15.0000 mL | OROMUCOSAL | Status: DC | PRN

## 2022-07-08 MED ORDER — CLEVIDIPINE BUTYRATE 0.5 MG/ML IV EMUL
INTRAVENOUS | Status: AC
  Filled 2022-07-08: qty 50

## 2022-07-08 MED ORDER — PROMETHAZINE HCL 25 MG PO TABS: 12.5000 mg | ORAL_TABLET | ORAL | Status: DC | PRN

## 2022-07-08 MED ORDER — PHENYLEPHRINE 80 MCG/ML (10ML) SYRINGE FOR IV PUSH (FOR BLOOD PRESSURE SUPPORT)
PREFILLED_SYRINGE | INTRAVENOUS | Status: AC
  Filled 2022-07-08: qty 10

## 2022-07-08 MED ORDER — FENTANYL BOLUS VIA INFUSION
25.0000 ug | INTRAVENOUS | Status: DC | PRN
  Administered 2022-07-09: 25 ug via INTRAVENOUS

## 2022-07-08 MED ORDER — ORAL CARE MOUTH RINSE
15.0000 mL | OROMUCOSAL | Status: DC
  Administered 2022-07-08 – 2022-07-22 (×167): 15 mL via OROMUCOSAL

## 2022-07-08 MED ORDER — CEFAZOLIN SODIUM-DEXTROSE 2-3 GM-%(50ML) IV SOLR
INTRAVENOUS | Status: DC | PRN
  Administered 2022-07-08: 2 g via INTRAVENOUS

## 2022-07-08 MED ORDER — THROMBIN 5000 UNITS EX SOLR
OROMUCOSAL | Status: DC | PRN
  Administered 2022-07-08: 5 mL via TOPICAL

## 2022-07-08 MED ORDER — ALBUMIN HUMAN 5 % IV SOLN: INTRAVENOUS | Status: DC | PRN

## 2022-07-08 MED ORDER — PANTOPRAZOLE SODIUM 40 MG IV SOLR
40.0000 mg | Freq: Every day | INTRAVENOUS | Status: DC
  Administered 2022-07-08 – 2022-08-01 (×25): 40 mg via INTRAVENOUS
  Filled 2022-07-08 (×24): qty 10

## 2022-07-08 MED ORDER — SODIUM CHLORIDE 0.9 % IV SOLN: INTRAVENOUS | Status: DC

## 2022-07-08 MED ORDER — CHLORHEXIDINE GLUCONATE CLOTH 2 % EX PADS
6.0000 | MEDICATED_PAD | Freq: Every day | CUTANEOUS | Status: DC
  Administered 2022-07-08 – 2022-07-10 (×4): 6 via TOPICAL

## 2022-07-08 MED ORDER — SUCCINYLCHOLINE CHLORIDE 20 MG/ML IJ SOLN
INTRAMUSCULAR | Status: DC | PRN
  Administered 2022-07-08: 100 mg via INTRAVENOUS

## 2022-07-08 MED ORDER — HYDROMORPHONE HCL 1 MG/ML IJ SOLN: 0.5000 mg | INTRAMUSCULAR | Status: DC | PRN

## 2022-07-08 MED ORDER — ETOMIDATE 2 MG/ML IV SOLN
INTRAVENOUS | Status: DC | PRN
  Administered 2022-07-08: 18 mg via INTRAVENOUS

## 2022-07-08 MED ORDER — CEFAZOLIN SODIUM-DEXTROSE 1-4 GM/50ML-% IV SOLN: 1.0000 g | Freq: Three times a day (TID) | INTRAVENOUS | Status: AC

## 2022-07-08 MED ORDER — BACITRACIN ZINC 500 UNIT/GM EX OINT
TOPICAL_OINTMENT | CUTANEOUS | Status: DC | PRN
  Administered 2022-07-08: 1 via TOPICAL

## 2022-07-08 MED ORDER — ONDANSETRON HCL 4 MG/2ML IJ SOLN
INTRAMUSCULAR | Status: AC
  Filled 2022-07-08: qty 2

## 2022-07-08 MED ORDER — BACITRACIN ZINC 500 UNIT/GM EX OINT
TOPICAL_OINTMENT | CUTANEOUS | Status: AC
  Filled 2022-07-08: qty 28.35

## 2022-07-08 MED ORDER — ONDANSETRON HCL 4 MG/2ML IJ SOLN: 4.0000 mg | INTRAMUSCULAR | Status: DC | PRN

## 2022-07-08 MED ORDER — LABETALOL HCL 5 MG/ML IV SOLN
10.0000 mg | INTRAVENOUS | Status: DC | PRN
  Administered 2022-07-09 – 2022-07-10 (×3): 10 mg via INTRAVENOUS
  Administered 2022-07-10: 40 mg via INTRAVENOUS
  Administered 2022-07-10: 20 mg via INTRAVENOUS
  Administered 2022-07-10: 40 mg via INTRAVENOUS
  Administered 2022-07-11 (×2): 20 mg via INTRAVENOUS
  Administered 2022-07-11: 40 mg via INTRAVENOUS
  Administered 2022-07-11 – 2022-07-13 (×8): 20 mg via INTRAVENOUS
  Administered 2022-07-13: 40 mg via INTRAVENOUS
  Administered 2022-07-13 (×2): 20 mg via INTRAVENOUS
  Filled 2022-07-08 (×3): qty 4
  Filled 2022-07-08: qty 8
  Filled 2022-07-08 (×5): qty 4
  Filled 2022-07-08: qty 8
  Filled 2022-07-08: qty 4
  Filled 2022-07-08: qty 8
  Filled 2022-07-08 (×5): qty 4
  Filled 2022-07-08: qty 8

## 2022-07-08 MED ORDER — CLEVIDIPINE BUTYRATE 0.5 MG/ML IV EMUL
0.0000 mg/h | INTRAVENOUS | Status: DC
  Administered 2022-07-08: 2 mg/h via INTRAVENOUS
  Administered 2022-07-09: 4 mg/h via INTRAVENOUS
  Administered 2022-07-09 – 2022-07-10 (×4): 2 mg/h via INTRAVENOUS
  Filled 2022-07-08 (×4): qty 50

## 2022-07-08 MED ORDER — FENTANYL 2500MCG IN NS 250ML (10MCG/ML) PREMIX INFUSION
25.0000 ug/h | INTRAVENOUS | Status: DC
  Administered 2022-07-08 (×3): 50 ug/h via INTRAVENOUS
  Filled 2022-07-08: qty 250

## 2022-07-08 MED ORDER — LEVETIRACETAM IN NACL 500 MG/100ML IV SOLN
500.0000 mg | Freq: Two times a day (BID) | INTRAVENOUS | Status: DC
  Administered 2022-07-08 – 2022-08-01 (×49): 500 mg via INTRAVENOUS
  Filled 2022-07-08 (×50): qty 100

## 2022-07-08 MED ORDER — PHENYLEPHRINE HCL-NACL 20-0.9 MG/250ML-% IV SOLN
INTRAVENOUS | Status: DC | PRN
  Administered 2022-07-08: 20 ug/min via INTRAVENOUS

## 2022-07-08 MED ORDER — ACETAMINOPHEN 325 MG PO TABS: 650.0000 mg | ORAL_TABLET | ORAL | Status: DC | PRN

## 2022-07-08 MED ORDER — DEXAMETHASONE SODIUM PHOSPHATE 10 MG/ML IJ SOLN
INTRAMUSCULAR | Status: AC
  Filled 2022-07-08: qty 1

## 2022-07-08 MED ORDER — HEMOSTATIC AGENTS (NO CHARGE) OPTIME
TOPICAL | Status: DC | PRN
  Administered 2022-07-08: 1 via TOPICAL

## 2022-07-08 MED ORDER — ROCURONIUM BROMIDE 10 MG/ML (PF) SYRINGE
PREFILLED_SYRINGE | INTRAVENOUS | Status: DC | PRN
  Administered 2022-07-08: 50 mg via INTRAVENOUS

## 2022-07-08 MED ORDER — PROPOFOL 10 MG/ML IV BOLUS
INTRAVENOUS | Status: AC
  Filled 2022-07-08: qty 20

## 2022-07-08 MED ORDER — EPHEDRINE SULFATE-NACL 50-0.9 MG/10ML-% IV SOSY
PREFILLED_SYRINGE | INTRAVENOUS | Status: DC | PRN
  Administered 2022-07-08 (×2): 5 mg via INTRAVENOUS

## 2022-07-08 MED ORDER — DEXAMETHASONE SODIUM PHOSPHATE 10 MG/ML IJ SOLN
INTRAMUSCULAR | Status: DC | PRN
  Administered 2022-07-08: 5 mg via INTRAVENOUS

## 2022-07-08 MED ORDER — ACETAMINOPHEN 650 MG RE SUPP: 650.0000 mg | RECTAL | Status: DC | PRN

## 2022-07-08 MED ORDER — ONDANSETRON HCL 4 MG PO TABS: 4.0000 mg | ORAL_TABLET | ORAL | Status: DC | PRN

## 2022-07-08 MED ORDER — THROMBIN 20000 UNITS EX SOLR
CUTANEOUS | Status: AC
  Filled 2022-07-08: qty 20000

## 2022-07-08 MED ORDER — LIDOCAINE-EPINEPHRINE 1 %-1:100000 IJ SOLN
INTRAMUSCULAR | Status: DC | PRN
  Administered 2022-07-08: 10 mL

## 2022-07-08 MED ORDER — ROCURONIUM BROMIDE 10 MG/ML (PF) SYRINGE
PREFILLED_SYRINGE | INTRAVENOUS | Status: AC
  Filled 2022-07-08: qty 10

## 2022-07-08 MED ORDER — PROPOFOL 1000 MG/100ML IV EMUL
0.0000 ug/kg/min | INTRAVENOUS | Status: DC
  Administered 2022-07-08: 10 ug/kg/min via INTRAVENOUS
  Administered 2022-07-09: 5 ug/kg/min via INTRAVENOUS
  Administered 2022-07-09: 20 ug/kg/min via INTRAVENOUS
  Administered 2022-07-09: 5 ug/kg/min via INTRAVENOUS
  Filled 2022-07-08 (×4): qty 100

## 2022-07-08 MED ORDER — ONDANSETRON HCL 4 MG/2ML IJ SOLN
INTRAMUSCULAR | Status: DC | PRN
  Administered 2022-07-08: 4 mg via INTRAVENOUS

## 2022-07-08 MED ORDER — EPHEDRINE 5 MG/ML INJ
INTRAVENOUS | Status: AC
  Filled 2022-07-08: qty 5

## 2022-07-08 MED ORDER — DEXAMETHASONE SODIUM PHOSPHATE 10 MG/ML IJ SOLN
10.0000 mg | Freq: Once | INTRAMUSCULAR | Status: AC
  Administered 2022-07-08: 10 mg via INTRAVENOUS
  Filled 2022-07-08: qty 1

## 2022-07-08 MED ORDER — CEFAZOLIN SODIUM 1 G IJ SOLR
INTRAMUSCULAR | Status: AC
  Filled 2022-07-08: qty 20

## 2022-07-08 MED ORDER — LABETALOL HCL 5 MG/ML IV SOLN
20.0000 mg | Freq: Once | INTRAVENOUS | Status: AC
  Administered 2022-07-08: 20 mg via INTRAVENOUS

## 2022-07-08 MED ORDER — 0.9 % SODIUM CHLORIDE (POUR BTL) OPTIME
TOPICAL | Status: DC | PRN
  Administered 2022-07-08: 2000 mL

## 2022-07-08 MED ORDER — LIDOCAINE-EPINEPHRINE 1 %-1:100000 IJ SOLN
INTRAMUSCULAR | Status: AC
  Filled 2022-07-08: qty 1

## 2022-07-08 MED ORDER — THROMBIN 5000 UNITS EX SOLR
CUTANEOUS | Status: AC
  Filled 2022-07-08: qty 5000

## 2022-07-08 SURGICAL SUPPLY — 70 items
ADH SKN CLS APL DERMABOND .7 (GAUZE/BANDAGES/DRESSINGS)
BAG COUNTER SPONGE SURGICOUNT (BAG) ×1 IMPLANT
BAG DECANTER FOR FLEXI CONT (MISCELLANEOUS) ×1 IMPLANT
BAG SPNG CNTER NS LX DISP (BAG) ×1
BIT DRILL WIRE PASS 1.3MM (BIT) ×1 IMPLANT
BLADE SURG 11 STRL SS (BLADE) IMPLANT
BNDG CMPR 75X41 PLY HI ABS (GAUZE/BANDAGES/DRESSINGS)
BNDG COHESIVE 4X5 TAN STRL (GAUZE/BANDAGES/DRESSINGS) IMPLANT
BNDG STRETCH 4X75 STRL LF (GAUZE/BANDAGES/DRESSINGS) IMPLANT
BUR ACORN 6.0 PRECISION (BURR) ×1 IMPLANT
BUR SPIRAL ROUTER 2.3 (BUR) IMPLANT
CANISTER SUCT 3000ML PPV (MISCELLANEOUS) ×1 IMPLANT
CLIP VESOCCLUDE MED 6/CT (CLIP) IMPLANT
CNTNR URN SCR LID CUP LEK RST (MISCELLANEOUS) IMPLANT
CONT SPEC 4OZ STRL OR WHT (MISCELLANEOUS) ×1
COVER BACK TABLE 60X90IN (DRAPES) ×2 IMPLANT
DERMABOND ADVANCED .7 DNX12 (GAUZE/BANDAGES/DRESSINGS) IMPLANT
DRAIN CHANNEL 10M FLAT 3/4 FLT (DRAIN) IMPLANT
DRAPE MICROSCOPE SLANT 54X150 (MISCELLANEOUS) IMPLANT
DRAPE NEUROLOGICAL W/INCISE (DRAPES) ×1 IMPLANT
DRAPE SURG 17X23 STRL (DRAPES) IMPLANT
DRAPE WARM FLUID 44X44 (DRAPES) ×1 IMPLANT
DRILL WIRE PASS 1.3MM (BIT)
ELECT REM PT RETURN 9FT ADLT (ELECTROSURGICAL) ×1
ELECTRODE REM PT RTRN 9FT ADLT (ELECTROSURGICAL) ×1 IMPLANT
EVACUATOR SILICONE 100CC (DRAIN) IMPLANT
GAUZE 4X4 16PLY ~~LOC~~+RFID DBL (SPONGE) IMPLANT
GAUZE SPONGE 4X4 12PLY STRL (GAUZE/BANDAGES/DRESSINGS) ×1 IMPLANT
GLOVE BIO SURGEON STRL SZ 6.5 (GLOVE) ×1 IMPLANT
GLOVE BIOGEL PI IND STRL 6.5 (GLOVE) ×1 IMPLANT
GLOVE ECLIPSE 9.0 STRL (GLOVE) ×1 IMPLANT
GLOVE EXAM NITRILE XL STR (GLOVE) IMPLANT
GOWN STRL REUS W/ TWL LRG LVL3 (GOWN DISPOSABLE) IMPLANT
GOWN STRL REUS W/ TWL XL LVL3 (GOWN DISPOSABLE) IMPLANT
GOWN STRL REUS W/TWL 2XL LVL3 (GOWN DISPOSABLE) IMPLANT
GOWN STRL REUS W/TWL LRG LVL3 (GOWN DISPOSABLE)
GOWN STRL REUS W/TWL XL LVL3 (GOWN DISPOSABLE)
GRAFT DURAGEN MATRIX 3WX3L (Graft) ×1 IMPLANT
GRAFT DURAGEN MATRIX 3X3 SNGL (Graft) IMPLANT
HEMOSTAT POWDER KIT SURGIFOAM (HEMOSTASIS) IMPLANT
HEMOSTAT SURGICEL 2X14 (HEMOSTASIS) IMPLANT
KIT BASIN OR (CUSTOM PROCEDURE TRAY) ×1 IMPLANT
KIT TURNOVER KIT B (KITS) ×1 IMPLANT
NDL HYPO 25X1 1.5 SAFETY (NEEDLE) ×1 IMPLANT
NEEDLE HYPO 25X1 1.5 SAFETY (NEEDLE) ×1 IMPLANT
NS IRRIG 1000ML POUR BTL (IV SOLUTION) ×1 IMPLANT
PACK CRANIOTOMY CUSTOM (CUSTOM PROCEDURE TRAY) ×1 IMPLANT
PAD ARMBOARD 7.5X6 YLW CONV (MISCELLANEOUS) ×1 IMPLANT
PATTIES SURGICAL .25X.25 (GAUZE/BANDAGES/DRESSINGS) IMPLANT
PATTIES SURGICAL .5 X.5 (GAUZE/BANDAGES/DRESSINGS) IMPLANT
PATTIES SURGICAL .5 X3 (DISPOSABLE) IMPLANT
PATTIES SURGICAL 1X1 (DISPOSABLE) IMPLANT
PIN MAYFIELD SKULL DISP (PIN) IMPLANT
PLATE CRANIAL 12 2H RIGID UNI (Plate) IMPLANT
SCREW UNIII AXS SD 1.5X4 (Screw) IMPLANT
SPONGE NEURO XRAY DETECT 1X3 (DISPOSABLE) IMPLANT
SPONGE SURGIFOAM ABS GEL 100 (HEMOSTASIS) ×1 IMPLANT
SPONGE T-LAP 18X18 ~~LOC~~+RFID (SPONGE) IMPLANT
STAPLER VISISTAT 35W (STAPLE) ×1 IMPLANT
STOCKINETTE 6  STRL (DRAPES) ×1
STOCKINETTE 6 STRL (DRAPES) IMPLANT
SUT NURALON 4 0 TR CR/8 (SUTURE) ×2 IMPLANT
SUT VIC AB 2-0 CT2 18 VCP726D (SUTURE) ×2 IMPLANT
TAPE PAPER 3X10 WHT MICROPORE (GAUZE/BANDAGES/DRESSINGS) IMPLANT
TOWEL GREEN STERILE (TOWEL DISPOSABLE) ×1 IMPLANT
TOWEL GREEN STERILE FF (TOWEL DISPOSABLE) ×1 IMPLANT
TRAY FOLEY MTR SLVR 16FR STAT (SET/KITS/TRAYS/PACK) IMPLANT
TUBING FEATHERFLOW (TUBING) IMPLANT
UNDERPAD 30X36 HEAVY ABSORB (UNDERPADS AND DIAPERS) IMPLANT
WATER STERILE IRR 1000ML POUR (IV SOLUTION) ×1 IMPLANT

## 2022-07-08 NOTE — Brief Op Note (Signed)
07/08/2022  6:35 PM  PATIENT:  Gracelyn Nurse  87 y.o. female  PRE-OPERATIVE DIAGNOSIS:  Right subdural hematoma  POST-OPERATIVE DIAGNOSIS:  Right subdural hematoma  PROCEDURE:  Procedure(s): RIGHT CRANIOTOMY HEMATOMA EVACUATION SUBDURAL (Right)  SURGEON:  Surgeon(s) and Role:    Earnie Larsson, MD - Primary  PHYSICIAN ASSISTANT:   ASSISTANTSMearl Latin   ANESTHESIA:   general  EBL:  500 mL   BLOOD ADMINISTERED:none  DRAINS: none   LOCAL MEDICATIONS USED:  LIDOCAINE   SPECIMEN:  Source of Specimen:  right frontal temporal   DISPOSITION OF SPECIMEN:  PATHOLOGY  COUNTS:  YES  TOURNIQUET:  * No tourniquets in log *  DICTATION: .Dragon Dictation  PLAN OF CARE: Admit to inpatient   PATIENT DISPOSITION:  ICU - intubated and hemodynamically stable.   Delay start of Pharmacological VTE agent (>24hrs) due to surgical blood loss or risk of bleeding: yes

## 2022-07-08 NOTE — ED Notes (Signed)
OR called and is ready for the pt. Called RT for transport.

## 2022-07-08 NOTE — Progress Notes (Signed)
Patient taken to OR on ventilator. CRNA took patient from desk. RN at bedside.

## 2022-07-08 NOTE — Op Note (Signed)
Date of procedure: 07/08/2022  Date of dictation: Same  Service: Neurosurgery  Preoperative diagnosis: Acute right frontal temporal intracerebral hemorrhage with secondary right convexity subdural hematoma, possible metastatic disease to the brain  Postoperative diagnosis: Same  Procedure Name: Right frontotemporoparietal craniotomy and evacuation of intracerebral hemorrhage and right convexity subdural hematoma.  Biopsy of suspicious tissue worrisome for metastatic disease  Surgeon:Makai Agostinelli A.Chasten Blaze, M.D.  Asst. Surgeon:.  Reinaldo Meeker, NP  Anesthesia: General  Indication: 87 year old female found unconscious earlier today.  Patient transported to the emergency room.  She was unconscious.  She was nonverbal.  She follows commands on her right side.  She was plegic on her left side.  She was intubated emergently.  CT scan demonstrated evidence of a right frontotemporal hemorrhage with surrounding edema worrisome for possible metastatic disease to the brain.  Patient with secondary rupture through the cortex and significant right convexity subdural hematoma.  Patient presents now for emergent surgery for evacuation of her intracerebral and subdural blood clots.  Operative note: After induction of anesthesia, patient positioned supine with her head turned toward the left.  Patient's right scalp was prepped and draped sterilely.  A curvilinear incision is made extending from the zygoma back behind her hairline on the right side.  Incision was carried down to the pericranium above in the temporalis fascia below.  The temporalis fascia and temporalis muscle was incised.  The scalp flap and temporalis muscle were reflected anteriorly and held in place with traction clamps.  A right frontotemporoparietal craniotomy was then performed using high-speed drill.  Bone flap was elevated.  The dura was incised and reflected inferiorly.  A large amount of fresh subdural hemorrhage with some active bleeding was encountered.   The subdural hematoma was evacuated.  It was clean from all areas of the convexity.  The cerebral cortex was obviously ruptured and there was some bleeding from the side of the cortex.  The rupture site was explored and underlying hematoma was found.  There was some abnormal tissue which was sent to pathology for evaluation of possible metastatic disease.  Bleeding points were controlled with bipolar intracoronary.  The hemorrhage cavity was fully evacuated.  This was then lined with Surgicel.  Hemostasis was excellent.  Wound was irrigated.  The dura was root loosely reapproximated and DuraGen was placed over the dural repair.  Bone flap was reattached with titanium plates.  Scalp reattached with 2-0 Vicryl suture the galea and temporalis fascia.  Staples were applied to the surface.  No apparent complications.  Patient tolerated the procedure well and she returns to the recovery room postop.

## 2022-07-08 NOTE — ED Provider Notes (Signed)
[facility] EMERGENCY DEPARTMENT Provider Note   CSN: [id number] Arrival date & time: 07/08/22  1507     History  Chief Complaint  Patient presents with   Code Stroke    Gina Kelley is a 87 y.o. female.  HPI Patient presents for strokelike symptoms.  Medical history includes T2DM, HLD, anemia, HTN, GERD, osteopenia, CVA.  She is not on daily anticoagulation.  She was last seen well by family at 1 PM.  At approximately 1:30 PM, patient's family noticed confusion, difficulty with speech, facial droop, left-sided weakness.  EMS was called.  EMS noted initial blood pressure of 270 SBP.  They also noted right-sided gaze and left-sided weakness.  Patient was not speaking during transit.  She maintained SpO2 98% on room air.    Home Medications Prior to Admission medications   Medication Sig Start Date End Date Taking? Authorizing Provider  alendronate (FOSAMAX) 70 MG tablet TAKE ONE TABLET BY MOUTH EVERY 7 DAYS. TAKE WITH A FULL GLASS OF WATER ON AN EMPTY STOMACH 12/21/21   Sid Falcon, MD  amLODipine (NORVASC) 5 MG tablet Take 1 tablet (5 mg total) by mouth daily. 05/14/22   Sid Falcon, MD  aspirin 81 MG chewable tablet Chew 81 mg by mouth daily.    [provider]  atorvastatin (LIPITOR) 80 MG tablet Take 1 tablet by mouth once daily 05/21/21   Angelica Pou, MD  Cholecalciferol (VITAMIN D3) 75 MCG (3000 UT) TABS Take 1 tablet by mouth daily. 11/18/19   Sid Falcon, MD  ezetimibe (ZETIA) 10 MG tablet Take 1 tablet (10 mg total) by mouth daily. 05/14/22   Sid Falcon, MD  losartan (COZAAR) 25 MG tablet Take 1 tablet by mouth once daily 05/14/22   Sid Falcon, MD      Allergies    Enalapril    Review of Systems   Review of Systems  Unable to perform ROS: Mental status change  Neurological:  Positive for facial asymmetry, speech difficulty and weakness.  Psychiatric/Behavioral:  Positive for confusion.     Physical  Exam Updated Vital Signs BP (!) 129/57   Pulse 71   Temp (!) 97.3 F (36.3 C) (Temporal)   Resp 16   Ht 5\' 3"  (1.6 m)   Wt 55.8 kg   SpO2 100%   BMI 21.79 kg/m  Physical Exam Vitals and nursing note reviewed.  Constitutional:      General: She is not in acute distress.    Appearance: She is well-developed. She is not toxic-appearing or diaphoretic.  HENT:     Head: Normocephalic and atraumatic.     Right Ear: External ear normal.     Left Ear: External ear normal.     Nose: Nose normal.     Mouth/Throat:     Mouth: Mucous membranes are moist.  Eyes:     Extraocular Movements: Extraocular movements intact.     Conjunctiva/sclera: Conjunctivae normal.  Cardiovascular:     Rate and Rhythm: Normal rate and regular rhythm.     Heart sounds: No murmur heard. Pulmonary:     Effort: Pulmonary effort is normal. No respiratory distress.     Breath sounds: Normal breath sounds. No wheezing or rales.  Chest:     Chest wall: No tenderness.  Abdominal:     General: There is no distension.     Palpations: Abdomen is soft.     Tenderness: There is no abdominal tenderness.  Musculoskeletal:  General: No swelling.     Cervical back: Normal range of motion and neck supple.     Right lower leg: No edema.     Left lower leg: No edema.  Skin:    General: Skin is warm and dry.     Coloration: Skin is not jaundiced or pale.  Neurological:     GCS: GCS eye subscore is 1. GCS verbal subscore is 1. GCS motor subscore is 6.     Comments: Right gaze deviation, not moving left hemibody.      ED Results / Procedures / Treatments   Labs (all labs ordered are listed, but only abnormal results are displayed) Labs Reviewed  CBC - Abnormal; Notable for the following components:      Result Value   RBC 3.44 (*)    Hemoglobin 10.4 (*)    HCT 31.3 (*)    All other components within normal limits  COMPREHENSIVE METABOLIC PANEL - Abnormal; Notable for the following components:    Potassium 3.2 (*)    Glucose, Bld 156 (*)    All other components within normal limits  CBG MONITORING, ED - Abnormal; Notable for the following components:   Glucose-Capillary 153 (*)    All other components within normal limits  I-STAT CHEM 8, ED - Abnormal; Notable for the following components:   Potassium 3.3 (*)    Glucose, Bld 158 (*)    Hemoglobin 11.6 (*)    HCT 34.0 (*)    All other components within normal limits  ETHANOL  PROTIME-INR  APTT  DIFFERENTIAL  RAPID URINE DRUG SCREEN, HOSP PERFORMED  URINALYSIS, ROUTINE W REFLEX MICROSCOPIC  BLOOD GAS, ARTERIAL    EKG EKG Interpretation  Date/Time:  Tuesday July 08 2022 15:28:04 EST Ventricular Rate:  77 PR Interval:  230 QRS Duration: 93 QT Interval:  402 QTC Calculation: 455 R Axis:   43 Text Interpretation: Sinus rhythm Atrial premature complex Prolonged PR interval Borderline repolarization abnormality Confirmed by Gloris Manchester [phone]) on 07/08/2022 4:02:57 PM  Radiology DG Chest Portable 1 View  Result Date: 07/08/2022 CLINICAL DATA:  Interval intubation and enteric tube placement EXAM: PORTABLE CHEST 1 VIEW COMPARISON:  None Available. FINDINGS: Lines/tubes: Endotracheal tube tip projects 2.8 cm above the carina. Enteric tube tip projects over the stomach with side hole 2 cm below the gastroesophageal junction. Chest: Lungs are clear without focal consolidation. Pleura: No pneumothorax or pleural effusion. Heart/mediastinum: The heart size and mediastinal contours are within normal limits. Bones: No acute osseous abnormality. IMPRESSION: 1. Endotracheal tube tip projects 2.8 cm above the carina. 2. Enteric tube tip projects over the stomach with side hole 2 cm below the gastroesophageal junction. Consider advancing by 3 cm for more optimal positioning. Electronically Signed   By: Agustin Cree M.D.   On: 07/08/2022 15:54    Procedures Procedure Name: Intubation Date/Time: 07/08/2022 3:36 PM  Performed by: Gloris Manchester,  MDPre-anesthesia Checklist: Patient identified Oxygen Delivery Method: Ambu bag Preoxygenation: Pre-oxygenation with 100% oxygen Induction Type: Rapid sequence Laryngoscope Size: Glidescope Grade View: Grade I Tube size: 7.5 mm Number of attempts: 1 Airway Equipment and Method: Rigid stylet and Video-laryngoscopy Placement Confirmation: ETT inserted through vocal cords under direct vision Secured at: 23 cm Tube secured with: ETT holder Dental Injury: Teeth and Oropharynx as per pre-operative assessment         Medications Ordered in ED Medications  labetalol (NORMODYNE) injection 20 mg (20 mg Intravenous Given 07/08/22 1517)    And  clevidipine (  CLEVIPREX) infusion 0.5 mg/mL (0 mg/hr Intravenous Stopped 07/08/22 1603)  fentaNYL (SUBLIMAZE) injection 25 mcg (0 mcg Intravenous Hold 07/08/22 1547)  fentaNYL in NS (48mcg/ml) infusion-PREMIX (50 mcg/hr Intravenous New Bag/Given 07/08/22 1605)  fentaNYL (SUBLIMAZE) bolus via infusion 25-100 mcg (has no administration in time range)  propofol (DIPRIVAN) 1000 MG/100ML infusion (20 mcg/kg/min  55.8 kg Intravenous Infusion Verify 07/08/22 1553)  etomidate (AMIDATE) injection (18 mg Intravenous Given 07/08/22 1530)  succinylcholine (ANECTINE) injection (100 mg Intravenous Given 07/08/22 1531)  dexamethasone (DECADRON) injection 10 mg (10 mg Intravenous Given 07/08/22 1541)    ED Course/ Medical Decision Making/ A&P                           Medical Decision Making Amount and/or Complexity of Data Reviewed Labs: ordered. Radiology: ordered.  Risk Prescription drug management.   This patient presents to the ED for concern of altered mental status, this involves an extensive number of treatment options, and is a complaint that carries with it a high risk of complications and morbidity.  The differential diagnosis includes CVA, ICH, hypertensive emergency, polypharmacy, intoxication, metabolic derangements   Co morbidities that  complicate the patient evaluation  T2DM, HLD, anemia, HTN, GERD, osteopenia, CVA   Additional history obtained:  Additional history obtained from EMS External records from outside source obtained and reviewed including EMR   Lab Tests:  I Ordered, and personally interpreted labs.  The pertinent results include: Mild hypokalemia with otherwise normal electrolytes, normal blood glucose, normal kidney function, baseline anemia, no leukocytosis   Imaging Studies ordered:  I ordered imaging studies including CT head I independently visualized and interpreted imaging which showed large acute intraparenchymal hematoma with associated subdural hematoma.  There is mass effect with 12 mm of midline shift I agree with the radiologist interpretation   Cardiac Monitoring: / EKG:  The patient was maintained on a cardiac monitor.  I personally viewed and interpreted the cardiac monitored which showed an underlying rhythm of: Sinus rhythm   Consultations Obtained:  I requested consultation with the neurologist, Dr. Amada Jupiter,  and discussed lab and imaging findings as well as pertinent plan - they recommend: Blood pressure control, neurosurgery involvement I requested consultation with the neurosurgeon, Dr. Jordan Likes,  and discussed lab and imaging findings as well as pertinent plan - they recommend: Will continue to follow and discuss surgical options with family.   Problem List / ED Course / Critical interventions / Medication management  Patient presents for acute onset of confusion, difficulty with speech, and left-sided weakness.  On arrival in the ED, she is GCS of 8.  She is maintaining her airway and EMS reports normal SpO2 on room air.  She arrives as a code stroke.  Last seen well was 2 hours prior to arrival.  She was taken directly to CT scanner.  CT scan shows right-sided intraparenchymal hemorrhage with old millimeters of midline shift.  Neurology gave 20 mg of labetalol and started  her on Cleviprex infusion for blood pressure control.  Given her current GCS with likely impending worsening, given her bleed with mass effect, patient was intubated for airway protection.  Fentanyl and propofol ordered for postintubation sedation.  Case was discussed with neurosurgeon, Dr. Dutch Quint, who evaluated the patient and spoke with her family.  He will wait to speak with her daughter for possible surgical intervention.  Following further discussions with neurosurgery, patient to be taken to the OR. I ordered medication  including etomidate and succinylcholine for RSI; fentanyl and propofol for postintubation sedation Reevaluation of the patient after these medicines showed that the patient improved I have reviewed the patients home medicines and have made adjustments as needed   Social Determinants of Health:  Has access to outpatient care  CRITICAL CARE Performed by: Gloris Manchester   Total critical care time: 35 minutes  Critical care time was exclusive of separately billable procedures and treating other patients.  Critical care was necessary to treat or prevent imminent or life-threatening deterioration.  Critical care was time spent personally by me on the following activities: development of treatment plan with patient and/or surrogate as well as nursing, discussions with consultants, evaluation of patient's response to treatment, examination of patient, obtaining history from patient or surrogate, ordering and performing treatments and interventions, ordering and review of laboratory studies, ordering and review of radiographic studies, pulse oximetry and re-evaluation of patient's condition.          Final Clinical Impression(s) / ED Diagnoses Final diagnoses:  Nontraumatic cortical hemorrhage of right cerebral hemisphere [facility])    Rx / DC Orders ED Discharge Orders     None         Gloris Manchester, MD 07/08/22 [phone]

## 2022-07-08 NOTE — ED Triage Notes (Signed)
Pt arrived to ED via [facility] as a [place] initially thought to be 1300, but verified w/ family to be 1155. Per family around 1330 pt was altered, had L facial droop and aphasia. EMS reported s/s of L facial droop, R gaze, global aphasia, L sided weakness. EMS VS: HR 90, RR 16, BP 220/palp, 98% RA, CBG 139. 18g L AC.  Pt was transported to CT w/ arrival time of 1507. While in CT pt had 1 small episode of dark brown vomit. Pt immediately suctioned to preserve airway. Pt then transported back to Ed room 5 per Leonel Ramsay MD. Doren Custard MD and RT called to prepare for intubation. Pt intubated w/ etomidate and succs. OG tube placed.   Pt belongings: bra, shirt, skirt, long johns, wig, metal bracelet, metal ring, rubber bracelet. All placed in belongings bag w/ sticker at bedside.  Family in consultation room

## 2022-07-08 NOTE — Consult Note (Signed)
NEUROLOGY CONSULTATION NOTE   Date of service: July 08, 2022 Patient Name: Gina Kelley MRN:  [id number] DOB:  1936-02-25 Reason for consult: "code stroke" Requesting Provider: Godfrey Pick, MD  History of Present Illness  HPI: Gina Kelley is a 87 y.o. female with PMH significant for type II dm with stage I CKD, essential hypertension, hyperlipidemia, diuretic-induced hypokalemia, osteopenia right femoral neck, GERD who presents as a CODE STROKE.    EMS was called for sudden onset of aphasia, left-sided weakness, left facial droop, and right gaze preference. SBP on their arrival was in the 270s. CBG was 98.    Arrived to the ED at 1505. LKW: 4098 tpa given?: No, ICH IR Thrombectomy? No, ICH Modified Rankin Scale: 1-No significant post stroke disability and can perform usual duties with stroke symptoms NIHSS: 42 ICH score: 2   ROS    ROS: Unable to obtain due to altered mental status.   Past History   Past Medical History:  Diagnosis Date   Anemia of chronic disease 07/08/2006   Antral ulcer 02/23/2007   Seen on EGD in 2008, small ulcer with erosion    Cerebral vascular accident ([place]) 04/14/2006   1998, left lower extremity numbness, no residual deficits    Essential hypertension 04/14/2006   Gastroesophageal reflux disease 02/18/2013   Occasional, symptomatically relieved with peptobismol    Hyperlipidemia LDL goal < 100 04/14/2006   Hypertensive retinopathy of both eyes, grade 1 05/15/2016   Osteopenia of right femoral neck 10/20/2016   DEXA (10/16/2016): R femur T -2.5 (FRAX tool calculates at -2.4), L1-L4 spine T -0.9, 10 year risk for: Major osteoporotic fracture 8.3%, Hip fracture 2.7%   Type 2 diabetes mellitus with stage 1 chronic kidney disease, without long-term current use of insulin (HCC)    Type II diabetes mellitus ([place]) 04/14/2006   Past Surgical History:  Procedure Laterality Date   COLONOSCOPY     ESOPHAGOGASTRODUODENOSCOPY     Family History   Problem Relation Age of Onset   Stroke Mother    Diabetes Mother    Hypertension Mother    Anuerysm Father 58       Cerebral   Asthma Sister    Breast cancer Sister    Pulmonary disease Brother        Black lung   Stroke Sister    Cirrhosis Brother    Hypertension Son    Asthma Brother    Social History   Socioeconomic History   Marital status: Married    Spouse name: Not on file   Number of children: Not on file   Years of education: Not on file   Highest education level: Not on file  Occupational History   Occupation: Retired     Comment: [employer]  Tobacco Use   Smoking status: Former    Types: Cigarettes    Quit date: 06/30/1978    Years since quitting: 44.0   Smokeless tobacco: Never  Vaping Use   Vaping Use: Never used  Substance and Sexual Activity   Alcohol use: No   Drug use: No   Sexual activity: Not Currently  Other Topics Concern   Not on file  Social History Narrative   Current Social History 09/07/2020        Patient lives with husband in one level home with 5 outside steps with handrails on both sides        Patient's method of transportation is personal car (drives herself)  The highest level of education was 9 th grade      The patient currently retired from [employer] (due to [employer]).      Identified important Relationships are, "My family"       Pets : None       Interests / Fun: "Go to [employer], sing, read. Went to gym before [employer]."       Current Stressors: "I don't have any. 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[place], BSN, RN-BC       Social Determinants of Health   Financial Resource Strain: Not on file  Food Insecurity: Not on file  Transportation Needs: Not on file  Physical Activity: Not on file  Stress: Not on  file  Social Connections: Not on file   Allergies  Allergen Reactions   Enalapril Cough    Medications  (Not in a hospital admission)    Vitals   Vitals:   07/08/22 1606 07/08/22 1608 07/08/22 1615 07/08/22 1623  BP: 118/60 125/72 138/69   Pulse: 63 60 61   Resp: 18 18 19    Temp:      TempSrc:      SpO2: 100% 100% 100%   Weight:    55.8 kg  Height:    5\' 3"  (1.6 m)     Body mass index is 21.79 kg/m.  Physical Exam   Constitutional: Appears critically ill  Cardiovascular: Normal rate and regular rhythm.  Respiratory: Worsening respiratory status with sonorous breathing and decreasing ability to protect airway GI: Soft.  No distension.  Skin: WDI  Neurologic Examination    Mental Status: Obtunded. She does not follow commands or speak. No eye opening.  Did not follow commands.    Cranial Nerves: Pupils are 72mm with sluggish response R gaze preference. No blink to threat bilaterally. L facial droop   Motor: Spontaneous movement RUE, RLE Left hemiplegia, she postures with the left upper extremity   Sensory: Withdraw to pain RUE/RLE No withdraw to pain LUE/LLE   Labs   CBC:  Recent Labs  Lab 07/08/22 1509 07/08/22 1512 07/08/22 1627  WBC 7.5  --   --   NEUTROABS 3.5  --   --   HGB 10.4* 11.6* 9.5*  HCT 31.3* 34.0* 28.0*  MCV 91.0  --   --   PLT 256  --   --     Basic Metabolic Panel:  Lab Results  Component Value Date   NA 139 07/08/2022   K 3.2 (L) 07/08/2022   CO2 26 07/08/2022   GLUCOSE 158 (H) 07/08/2022   BUN 23 07/08/2022   CREATININE 0.60 07/08/2022   CALCIUM 9.1 07/08/2022   GFRNONAA >60 07/08/2022   GFRAA 73 05/11/2020   Lipid Panel:  Lab Results  Component Value Date   LDLCALC 153 (H) 11/23/2020   HgbA1c:  Lab Results  Component Value Date   HGBA1C 6.1 (A) 07/12/2021   Urine Drug Screen: No results found for: "LABOPIA", "COCAINSCRNUR", "LABBENZ", "AMPHETMU", "THCU", "LABBARB"  Alcohol Level     Component Value  Date/Time   ETH <10 07/08/2022 1509    CT Head without contrast(Personally reviewed): Large acute intraparenchymal hematoma within the right frontal operculum  with associated right frontoparietal subdural hematoma and 12 mm of leftward midline shift. No intraventricular extension.   Impression   Gina Kelley is a 87 y.o. female with PMH significant for  type II dm with stage I CKD, essential hypertension, hyperlipidemia, diuretic-induced hypokalemia, osteopenia right femoral neck, GERD. Her neurologic examination is notable for an obtunded state, global aphasia, left hemiplegia, left facial droop and right gaze preference. .  Primary Diagnosis:  Acute Non-Traumatic Right Frontal ICH versus Hemorrhagic Metastases with Cerebral Edema and Brain Compression   Secondary Diagnosis: Cerebral edema, Acute respiratory failure with hypoxia, Essential (primary) hypertension, Hypertension Emergency (SBP > 180 or DBP > 120 & end organ damage), Type 2 diabetes mellitus w/o complications, and CKD Stage 1 (GFR>90)  Recommendations   - SBP goal 130-150 - Cleviprex gtt for BP control - Decadron 10mg  x1 - No antiplatelets or blood thinners - NG tube placement - PT/OT/ST when able ______________________________________________________________________   Pt seen by Neuro NP/APP and later by MD. Note/plan to be edited by MD as needed.    , DNP, AGACNP-BC [facility] Please use AMION for pager and EPIC for messaging   I have seen the patient and was present for the entirety of the evaluation and management reflected in the above note.  She has what appears to be vasogenic edema in the left frontal region which is not directly adjacent to the hemorrhage, and therefore likely represents a separate process.  I suspect that she may have metastatic disease, and it is possible that this is the culprit for her intraparenchymal hematoma.  There is no definite lesion on CT,  therefore spontaneous hemorrhage is possible, but less likely.  Other etiologies such as amyloid, infarction with hemorrhagic conversion are also possible.  Appreciate neurosurgical assistance, stroke team will follow.  Lynnae January, MD [facility] [phone]  If 7pm- 7am, please page neurology on call as listed in AMION.

## 2022-07-08 NOTE — Anesthesia Postprocedure Evaluation (Signed)
Anesthesia Post Note  Patient: Gina Kelley  Procedure(s) Performed: RIGHT CRANIOTOMY HEMATOMA EVACUATION SUBDURAL (Right)     Patient location during evaluation: PACU Anesthesia Type: General Level of consciousness: sedated and patient cooperative Pain management: pain level controlled Vital Signs Assessment: post-procedure vital signs reviewed and stable Respiratory status: spontaneous breathing Cardiovascular status: stable Anesthetic complications: no   No notable events documented.  Last Vitals:  Vitals:   07/08/22 1900 07/08/22 1939  BP: (!) 159/55   Pulse: 70   Resp: 16   Temp:    SpO2: 100% 100%    Last Pain:  Vitals:   07/08/22 1551  TempSrc: Temporal                 Nolon Nations

## 2022-07-08 NOTE — Progress Notes (Signed)
Belonging list sent home with family: Shoes, pants, jacket, long sleeve T-shirt, 1 ring, 2 bracelets, 1 wig, and an AVS from a previous visit.

## 2022-07-08 NOTE — ED Notes (Signed)
Pt transported to OR w/ all of pt belongings. Per Mallory RN, family can wait in [facility] waiting room. Notified CN to have someone take family to waiting area.

## 2022-07-08 NOTE — ED Notes (Signed)
Gave report to CRNA at this time. Notified them of all of pt belongings on stretcher and that family is being taken to [facility]. VSS upon handoff.

## 2022-07-08 NOTE — Anesthesia Preprocedure Evaluation (Addendum)
Anesthesia Evaluation  Patient identified by MRN, date of birth, ID band Patient unresponsive    Reviewed: Allergy & Precautions, Patient's Chart, lab work & pertinent test results, Unable to perform ROS - Chart review onlyPreop documentation limited or incomplete due to emergent nature of procedure.  Airway Mallampati: Intubated       Dental   Pulmonary former smoker Intubated in ER    + decreased breath sounds      Cardiovascular hypertension, Pt. on medications  Rhythm:Regular Rate:Normal     Neuro/Psych CVA, Residual Symptoms    GI/Hepatic negative GI ROS, Neg liver ROS,,,  Endo/Other  diabetes    Renal/GU negative Renal ROS     Musculoskeletal negative musculoskeletal ROS (+)    Abdominal   Peds  Hematology  (+) Blood dyscrasia, anemia   Anesthesia Other Findings   Reproductive/Obstetrics                              Anesthesia Physical Anesthesia Plan  ASA: 4 and emergent  Anesthesia Plan: General   Post-op Pain Management:    Induction: Intravenous  PONV Risk Score and Plan: 3 and Treatment may vary due to age or medical condition  Airway Management Planned: Oral ETT  Additional Equipment:   Intra-op Plan:   Post-operative Plan: Post-operative intubation/ventilation  Informed Consent: I have reviewed the patients History and Physical, chart, labs and discussed the procedure including the risks, benefits and alternatives for the proposed anesthesia with the patient or authorized representative who has indicated his/her understanding and acceptance.       Plan Discussed with: Anesthesiologist, CRNA and Surgeon  Anesthesia Plan Comments:          Anesthesia Quick Evaluation

## 2022-07-08 NOTE — Transfer of Care (Signed)
Immediate Anesthesia Transfer of Care Note  Patient: Gina Kelley  Procedure(s) Performed: RIGHT CRANIOTOMY HEMATOMA EVACUATION SUBDURAL (Right)  Patient Location: ICU  Anesthesia Type:General  Level of Consciousness: Patient remains intubated per anesthesia plan  Airway & Oxygen Therapy: Patient remains intubated per anesthesia plan and Patient placed on Ventilator (see vital sign flow sheet for setting)  Post-op Assessment: Report given to RN and Post -op Vital signs reviewed and stable  Post vital signs: Reviewed and stable  Last Vitals:  Vitals Value Taken Time  BP 143/113 07/08/22 1907  Temp    Pulse 68 07/08/22 1907  Resp 16 07/08/22 1907  SpO2 100 % 07/08/22 1907  Vitals shown include unvalidated device data.  Last Pain:  Vitals:   07/08/22 1551  TempSrc: Temporal         Complications: No notable events documented.

## 2022-07-08 NOTE — Code Documentation (Signed)
[name] is a 87 yr old female with a PMH of DM, HTN and GERD. presenting to [facility] on 07/08/2022 with a chief complaint of aphasia and left sided weakness. She was last known well today at 42, sometime after after which husband found her down and unable to talk. EMS activate code stroke alert. Unknown use of blood thinners.    Pt met at bridge by Stroke team. Airway cleared by EDP. CBG, Labs obtained. Pt to CT with team. NIHSS 27. Please see documentation for stroke times and details. Pt with global aphasia, Right gaze, left hemiparesis. CTNC reveals Right ICH per Dr. Leonel Ramsay. BP at that time 200/78. Labetelol 20 mg given in CT. Decision made to return pt to Room 5 for intubation. Pt intubated by Dr. Doren Custard. Cleviprex gtt initiated. Propofol and fentanyl gtts started.    Drs Leonel Ramsay and Pool talking to family. Pt will need to keep BP 130-150. She will need q 1 hr VS (at least) and q 1 NIHSS and pupil check. Bedside handoff with Sheran Luz. Pt not candidate for thrombolytic or thrombectomy due to [place].

## 2022-07-08 NOTE — H&P (Signed)
Gina Kelley is an 87 y.o. female.   Chief Complaint: Subdural hematoma HPI: 87 year old female without major medical problem presents after being found unconscious by her husband earlier this afternoon.  No history of trauma.  No known history of malignancy.  No known history of hypoxia or hypotension.  Patient transported to the emergency department.  She is found to be unconscious, nonverbal, with left hemiplegia.  Patient was purposeful and following some commands with the right side.  Patient intubated for airway control.  CT scan demonstrates evidence of a large convexity right-sided subdural hematoma.  There is evidence of intraparenchymal hemorrhage in her right posterior frontal temporal region.  There is irregularity around the hemorrhage bed worrisome for possible intrinsic brain tumor.  She also has significant right frontal edema also worrisome for possible tumor.  Past Medical History:  Diagnosis Date   Anemia of chronic disease 07/08/2006   Antral ulcer 02/23/2007   Seen on EGD in 2008, small ulcer with erosion    Cerebral vascular accident (HCC) 04/14/2006   1998, left lower extremity numbness, no residual deficits    Essential hypertension 04/14/2006   Gastroesophageal reflux disease 02/18/2013   Occasional, symptomatically relieved with peptobismol    Hyperlipidemia LDL goal < 100 04/14/2006   Hypertensive retinopathy of both eyes, grade 1 05/15/2016   Osteopenia of right femoral neck 10/20/2016   DEXA (10/16/2016): R femur T -2.5 (FRAX tool calculates at -2.4), L1-L4 spine T -0.9, 10 year risk for: Major osteoporotic fracture 8.3%, Hip fracture 2.7%   Type 2 diabetes mellitus with stage 1 chronic kidney disease, without long-term current use of insulin (HCC)    Type II diabetes mellitus (HCC) 04/14/2006    Past Surgical History:  Procedure Laterality Date   COLONOSCOPY     ESOPHAGOGASTRODUODENOSCOPY      Family History  Problem Relation Age of Onset   Stroke Mother     Diabetes Mother    Hypertension Mother    Anuerysm Father 75       Cerebral   Asthma Sister    Breast cancer Sister    Pulmonary disease Brother        Black lung   Stroke Sister    Cirrhosis Brother    Hypertension Son    Asthma Brother    Social History:  reports that she quit smoking about 44 years ago. She has never used smokeless tobacco. She reports that she does not drink alcohol and does not use drugs.  Allergies:  Allergies  Allergen Reactions   Enalapril Cough    (Not in a hospital admission)   Results for orders placed or performed during the hospital encounter of 07/08/22 (from the past 48 hour(s))  CBG monitoring, ED     Status: Abnormal   Collection Time: 07/08/22  3:08 PM  Result Value Ref Range   Glucose-Capillary 153 (H) 70 - 99 mg/dL    Comment: Glucose reference range applies only to samples taken after fasting for at least 8 hours.  Ethanol     Status: None   Collection Time: 07/08/22  3:09 PM  Result Value Ref Range   Alcohol, Ethyl (B) <10 <10 mg/dL    Comment: (NOTE) Lowest detectable limit for serum alcohol is 10 mg/dL.  For medical purposes only. Performed at [facility], [address] [place], [place] [address]   Protime-INR     Status: None   Collection Time: 07/08/22  3:09 PM  Result Value Ref Range  Prothrombin Time 13.6 11.4 - 15.2 seconds   INR 1.1 0.8 - 1.2    Comment: (NOTE) INR goal varies based on device and disease states. Performed at [facility], [place] [address], [place], [place] [address]   APTT     Status: None   Collection Time: 07/08/22  3:09 PM  Result Value Ref Range   aPTT 25 24 - 36 seconds    Comment: Performed at [facility] [address], [place], [place] [address]  CBC     Status: Abnormal   Collection Time: 07/08/22  3:09 PM  Result Value Ref Range   WBC 7.5 4.0 - 10.5 K/uL   RBC 3.44 (L) 3.87 - 5.11 MIL/uL   Hemoglobin 10.4 (L) 12.0 - 15.0 g/dL   HCT 31.3 (L) 36.0 - 46.0 %   MCV  91.0 80.0 - 100.0 fL   MCH 30.2 26.0 - 34.0 pg   MCHC 33.2 30.0 - 36.0 g/dL   RDW 15.3 11.5 - 15.5 %   Platelets 256 150 - 400 K/uL   nRBC 0.0 0.0 - 0.2 %    Comment: Performed at [facility], [place] [address], [place], [place] [address]  Differential     Status: None   Collection Time: 07/08/22  3:09 PM  Result Value Ref Range   Neutrophils Relative % 46 %   Neutro Abs 3.5 1.7 - 7.7 K/uL   Lymphocytes Relative 41 %   Lymphs Abs 3.1 0.7 - 4.0 K/uL   Monocytes Relative 10 %   Monocytes Absolute 0.7 0.1 - 1.0 K/uL   Eosinophils Relative 2 %   Eosinophils Absolute 0.1 0.0 - 0.5 K/uL   Basophils Relative 1 %   Basophils Absolute 0.0 0.0 - 0.1 K/uL   Immature Granulocytes 0 %   Abs Immature Granulocytes 0.01 0.00 - 0.07 K/uL    Comment: Performed at [facility], [place] [address] [place], [place] [address]  Comprehensive metabolic panel     Status: Abnormal   Collection Time: 07/08/22  3:09 PM  Result Value Ref Range   Sodium 137 135 - 145 mmol/L   Potassium 3.2 (L) 3.5 - 5.1 mmol/L   Chloride 104 98 - 111 mmol/L   CO2 26 22 - 32 mmol/L   Glucose, Bld 156 (H) 70 - 99 mg/dL    Comment: Glucose reference range applies only to samples taken after fasting for at least 8 hours.   BUN 19 8 - 23 mg/dL   Creatinine, Ser 0.71 0.44 - 1.00 mg/dL   Calcium 9.1 8.9 - 10.3 mg/dL   Total Protein 7.5 6.5 - 8.1 g/dL   Albumin 3.6 3.5 - 5.0 g/dL   AST 38 15 - 41 U/L   ALT 21 0 - 44 U/L   Alkaline Phosphatase 60 38 - 126 U/L   Total Bilirubin 0.6 0.3 - 1.2 mg/dL   GFR, Estimated >60 >60 mL/min    Comment: (NOTE) Calculated using the CKD-EPI Creatinine Equation (2021)    Anion gap 7 5 - 15    Comment: Performed at [facility] [address] [place], [place] [address]  I-stat chem 8, ED     Status: Abnormal   Collection Time: 07/08/22  3:12 PM  Result Value Ref Range   Sodium 141 135 - 145 mmol/L   Potassium 3.3 (L) 3.5 - 5.1 mmol/L   Chloride 102 98 - 111 mmol/L   BUN  23  8 - 23 mg/dL   Creatinine, Ser 4.09 0.44 - 1.00 mg/dL   Glucose, Bld 735 (H) 70 - 99 mg/dL    Comment: Glucose reference range applies only to samples taken after fasting for at least 8 hours.   Calcium, Ion 1.19 1.15 - 1.40 mmol/L   TCO2 28 22 - 32 mmol/L   Hemoglobin 11.6 (L) 12.0 - 15.0 g/dL   HCT 32.9 (L) 92.4 - 26.8 %  I-Stat arterial blood gas, ED     Status: Abnormal   Collection Time: 07/08/22  4:27 PM  Result Value Ref Range   pH, Arterial 7.418 7.35 - 7.45   pCO2 arterial 44.1 32 - 48 mmHg   pO2, Arterial 528 (H) 83 - 108 mmHg   Bicarbonate 28.7 (H) 20.0 - 28.0 mmol/L   TCO2 30 22 - 32 mmol/L   O2 Saturation 100 %   Acid-Base Excess 3.0 (H) 0.0 - 2.0 mmol/L   Sodium 139 135 - 145 mmol/L   Potassium 3.2 (L) 3.5 - 5.1 mmol/L   Calcium, Ion 1.22 1.15 - 1.40 mmol/L   HCT 28.0 (L) 36.0 - 46.0 %   Hemoglobin 9.5 (L) 12.0 - 15.0 g/dL   Patient temperature 34.1 F    Collection site RADIAL, ALLEN'S TEST ACCEPTABLE    Drawn by RT    Sample type ARTERIAL    DG Chest Portable 1 View  Result Date: 07/08/2022 CLINICAL DATA:  Interval intubation and enteric tube placement EXAM: PORTABLE CHEST 1 VIEW COMPARISON:  None Available. FINDINGS: Lines/tubes: Endotracheal tube tip projects 2.8 cm above the carina. Enteric tube tip projects over the stomach with side hole 2 cm below the gastroesophageal junction. Chest: Lungs are clear without focal consolidation. Pleura: No pneumothorax or pleural effusion. Heart/mediastinum: The heart size and mediastinal contours are within normal limits. Bones: No acute osseous abnormality. IMPRESSION: 1. Endotracheal tube tip projects 2.8 cm above the carina. 2. Enteric tube tip projects over the stomach with side hole 2 cm below the gastroesophageal junction. Consider advancing by 3 cm for more optimal positioning. Electronically Signed   By: Agustin Cree M.D.   On: 07/08/2022 15:54   CT HEAD CODE STROKE WO CONTRAST  Addendum Date: 07/08/2022   ADDENDUM  REPORT: 07/08/2022 15:45 Electronically Signed   By: Orvan Falconer M.D   On: 07/08/2022 15:45   Result Date: 07/08/2022 CLINICAL DATA:  Code stroke. EXAM: CT HEAD WITHOUT CONTRAST TECHNIQUE: Contiguous axial images were obtained from the base of the skull through the vertex without intravenous contrast. RADIATION DOSE REDUCTION: This exam was performed according to the departmental dose-optimization program which includes automated exposure control, adjustment of the mA and/or kV according to patient size and/or use of iterative reconstruction technique. COMPARISON:  None Available. FINDINGS: Brain: Acute intraparenchymal hematoma in the right frontal operculum measuring up to 40 by 39 by 41 mm. No intraventricular extension. Associated overlying subdural hemorrhage measuring up to 10 mm in thickness along the right frontoparietal convexity. Associated 12 mm of leftward midline shift. Vascular: No hyperdense vessel. Skull: Normal. Negative for fracture or focal lesion. Sinuses/Orbits: No acute finding. Other: None. IMPRESSION: Large acute intraparenchymal hematoma within the right frontal operculum with associated right frontoparietal subdural hematoma and 12 mm of leftward midline shift. No intraventricular extension. Critical Value/emergent results were called by telephone at the time of interpretation on 07/08/2022 at 3:30 pm to provider [facility], who verbally acknowledged these results. Electronically Signed: By: Orvan Falconer M.D On: 07/08/2022 15:33  Review of systems not obtained due to patient factors.  Blood pressure 138/69, pulse 61, temperature (!) 97.3 F (36.3 C), temperature source Temporal, resp. rate 19, height 5\' 3"  (1.6 m), weight 55.8 kg, SpO2 100 %.  Patient is intubated.  She still has paralytics on board.  Pupils are 2 mm and reactive bilaterally.  Her gaze is conjugate.  Examination head ears eyes nose throat demonstrates no evidence of trauma.  Oropharynx, nasopharynx and  external auditory canals are clear.  Chest and abdomen appear benign.  Extremities are free from injury deformity. Assessment/Plan Acute right subdural hematoma possibly secondary to intraparenchymal hemorrhage versus hemorrhagic metastatic disease with subdural extension.  I have discussed situation with the patient's family.  They wish me to proceed with emergent intervention.  I discussed the risks involved with a right-sided craniotomy and evacuation of subdural hematoma with possible tumor resection as well.  Patient is unable to give consent but her family consents to moving forward with emergent surgery.  A Manar Smalling 07/08/2022, 4:32 PM

## 2022-07-09 ENCOUNTER — Inpatient Hospital Stay (HOSPITAL_COMMUNITY): Payer: Medicare HMO

## 2022-07-09 DIAGNOSIS — Z9889 Other specified postprocedural states: Secondary | ICD-10-CM | POA: Diagnosis not present

## 2022-07-09 DIAGNOSIS — I6381 Other cerebral infarction due to occlusion or stenosis of small artery: Secondary | ICD-10-CM | POA: Diagnosis not present

## 2022-07-09 DIAGNOSIS — I639 Cerebral infarction, unspecified: Secondary | ICD-10-CM

## 2022-07-09 DIAGNOSIS — I611 Nontraumatic intracerebral hemorrhage in hemisphere, cortical: Secondary | ICD-10-CM | POA: Diagnosis not present

## 2022-07-09 LAB — CBC
HCT: 24.1 % — ABNORMAL LOW (ref 36.0–46.0)
Hemoglobin: 7.6 g/dL — ABNORMAL LOW (ref 12.0–15.0)
MCH: 29.5 pg (ref 26.0–34.0)
MCHC: 31.5 g/dL (ref 30.0–36.0)
MCV: 93.4 fL (ref 80.0–100.0)
Platelets: 172 10*3/uL (ref 150–400)
RBC: 2.58 MIL/uL — ABNORMAL LOW (ref 3.87–5.11)
RDW: 15.4 % (ref 11.5–15.5)
WBC: 7.7 10*3/uL (ref 4.0–10.5)
nRBC: 0 % (ref 0.0–0.2)

## 2022-07-09 LAB — BASIC METABOLIC PANEL
Anion gap: 9 (ref 5–15)
BUN: 19 mg/dL (ref 8–23)
CO2: 23 mmol/L (ref 22–32)
Calcium: 7.8 mg/dL — ABNORMAL LOW (ref 8.9–10.3)
Chloride: 106 mmol/L (ref 98–111)
Creatinine, Ser: 0.78 mg/dL (ref 0.44–1.00)
GFR, Estimated: >60 mL/min (ref >60)
Glucose, Bld: 251 mg/dL — ABNORMAL HIGH (ref 70–99)
Potassium: 3.1 mmol/L — ABNORMAL LOW (ref 3.5–5.1)
Sodium: 138 mmol/L (ref 135–145)

## 2022-07-09 LAB — GLUCOSE, CAPILLARY
Glucose-Capillary: 114 mg/dL — ABNORMAL HIGH (ref 70–99)
Glucose-Capillary: 122 mg/dL — ABNORMAL HIGH (ref 70–99)
Glucose-Capillary: 133 mg/dL — ABNORMAL HIGH (ref 70–99)
Glucose-Capillary: 165 mg/dL — ABNORMAL HIGH (ref 70–99)

## 2022-07-09 LAB — TRIGLYCERIDES: Triglycerides: 31 mg/dL (ref <150)

## 2022-07-09 MED ORDER — POTASSIUM CHLORIDE 10 MEQ/100ML IV SOLN
10.0000 meq | INTRAVENOUS | Status: AC
  Administered 2022-07-09 (×4): 10 meq via INTRAVENOUS
  Filled 2022-07-09 (×4): qty 100

## 2022-07-09 MED ORDER — ACETAMINOPHEN 325 MG PO TABS
650.0000 mg | ORAL_TABLET | ORAL | Status: DC | PRN
  Administered 2022-07-09 – 2022-08-02 (×30): 650 mg
  Filled 2022-07-09 (×31): qty 2

## 2022-07-09 MED ORDER — INSULIN ASPART 100 UNIT/ML IJ SOLN
2.0000 [IU] | INTRAMUSCULAR | Status: DC
  Administered 2022-07-09: 2 [IU] via SUBCUTANEOUS
  Administered 2022-07-09: 4 [IU] via SUBCUTANEOUS
  Administered 2022-07-10 (×2): 2 [IU] via SUBCUTANEOUS
  Administered 2022-07-10 – 2022-07-11 (×3): 4 [IU] via SUBCUTANEOUS
  Administered 2022-07-11 (×2): 2 [IU] via SUBCUTANEOUS
  Administered 2022-07-11 (×2): 4 [IU] via SUBCUTANEOUS
  Administered 2022-07-12: 6 [IU] via SUBCUTANEOUS

## 2022-07-09 MED ORDER — POTASSIUM CHLORIDE 20 MEQ PO PACK
20.0000 meq | PACK | ORAL | Status: AC
  Administered 2022-07-09 (×2): 20 meq
  Filled 2022-07-09 (×2): qty 1

## 2022-07-09 MED ORDER — ACETAMINOPHEN 650 MG RE SUPP: 650.0000 mg | RECTAL | Status: DC | PRN

## 2022-07-09 NOTE — Progress Notes (Signed)
Postop day 1.  No new issues or problems overnight.  Patient remains sedated on ventilator.  She is afebrile.  She is oxygenating well.  Her vital signs are stable.  She remains sedated on Cleviprex and propofol.  She does not awaken to stimuli.  Her pupils are small and reactive bilaterally.  Chest and abdomen are benign.  Follow-up head CT scan demonstrates resolution of right convexity subdural hematoma.  Her intraparenchymal hemorrhages been debulked.  There is some surrounding hemorrhage around the edges of the resection cavity but no evidence of mass effect.  She is anemic with a hematocrit of 24.  Sodium and renal function are good.  Overall progressing reasonably well.  Follow-up scan encouraging.  Okay to begin weaning off sedation and working towards ventilatory weaning.

## 2022-07-09 NOTE — Progress Notes (Signed)
Transferred patient to C.T while patient was on the ventilator. Patient remained stable during transport.

## 2022-07-09 NOTE — Progress Notes (Signed)
STROKE TEAM PROGRESS NOTE   SUBJECTIVE (INTERVAL HISTORY) No family at the bedside.  Pt still intubated, just off sedation, still intubated, not responsive. S/p craniotomy yesterday, midline shift much improved on repeat CT this am.    OBJECTIVE Temp:  [94 F (34.4 C)-98 F (36.7 C)] 97.1 F (36.2 C) (01/10 0800) Pulse Rate:  [57-107] 73 (01/10 1230) Cardiac Rhythm: Normal sinus rhythm (01/10 0400) Resp:  [12-22] 16 (01/10 1230) BP: (108-200)/(47-92) 137/59 (01/10 1230) SpO2:  [93 %-100 %] 100 % (01/10 1230) Arterial Line BP: (108-173)/(44-64) 149/58 (01/10 1230) FiO2 (%):  [30 %-100 %] 30 % (01/10 1105) Weight:  [55.5 kg-55.8 kg] 55.5 kg (01/10 0500)  Recent Labs  Lab 07/08/22 1508 07/09/22 1128  GLUCAP 153* 165*   Recent Labs  Lab 07/08/22 1509 07/08/22 1512 07/08/22 1627 07/08/22 2027 07/09/22 0523  NA 137 141 139 144 138  K 3.2* 3.3* 3.2* 2.8* 3.1*  CL 104 102  --   --  106  CO2 26  --   --   --  23  GLUCOSE 156* 158*  --   --  251*  BUN 19 23  --   --  19  CREATININE 0.71 0.60  --   --  0.78  CALCIUM 9.1  --   --   --  7.8*   Recent Labs  Lab 07/08/22 1509  AST 38  ALT 21  ALKPHOS 60  BILITOT 0.6  PROT 7.5  ALBUMIN 3.6   Recent Labs  Lab 07/08/22 1509 07/08/22 1512 07/08/22 1627 07/08/22 2027 07/09/22 0523  WBC 7.5  --   --   --  7.7  NEUTROABS 3.5  --   --   --   --   HGB 10.4* 11.6* 9.5* 8.5* 7.6*  HCT 31.3* 34.0* 28.0* 25.0* 24.1*  MCV 91.0  --   --   --  93.4  PLT 256  --   --   --  172   No results for input(s): "CKTOTAL", "CKMB", "CKMBINDEX", "TROPONINI" in the last 168 hours. Recent Labs    07/08/22 1509  LABPROT 13.6  INR 1.1   Recent Labs    07/08/22 1944  COLORURINE STRAW*  LABSPEC 1.009  PHURINE 6.0  GLUCOSEU NEGATIVE  HGBUR NEGATIVE  BILIRUBINUR NEGATIVE  KETONESUR 5*  PROTEINUR 30*  NITRITE POSITIVE*  LEUKOCYTESUR NEGATIVE       Component Value Date/Time   CHOL 221 (H) 11/23/2020 0912   TRIG 31 07/09/2022 0523    HDL 63 11/23/2020 0912   CHOLHDL 3.5 11/23/2020 0912   CHOLHDL 3.5 04/14/2014 1023   VLDL 8 04/14/2014 1023   LDLCALC 153 (H) 11/23/2020 0912   Lab Results  Component Value Date   HGBA1C 6.1 (A) 07/12/2021      Component Value Date/Time   LABOPIA NONE DETECTED 07/08/2022 1944   COCAINSCRNUR NONE DETECTED 07/08/2022 1944   LABBENZ NONE DETECTED 07/08/2022 1944   AMPHETMU NONE DETECTED 07/08/2022 1944   THCU NONE DETECTED 07/08/2022 1944   LABBARB NONE DETECTED 07/08/2022 1944    Recent Labs  Lab 07/08/22 1509  ETH <10    I have personally reviewed the radiological images below and agree with the radiology interpretations.  CT HEAD WO CONTRAST  Result Date: 07/09/2022 CLINICAL DATA:  Follow-up examination for intracranial hemorrhage. EXAM: CT HEAD WITHOUT CONTRAST TECHNIQUE: Contiguous axial images were obtained from the base of the skull through the vertex without intravenous contrast. RADIATION DOSE REDUCTION: This exam was performed  according to the departmental dose-optimization program which includes automated exposure control, adjustment of the mA and/or kV according to patient size and/or use of iterative reconstruction technique. COMPARISON:  Prior CT from 07/08/2022. FINDINGS: Brain: Postoperative changes from interval right-sided craniotomy for subdural and intraparenchymal hematoma evacuation. Scattered postoperative pneumocephalus, most pronounced overlying the anterior frontal convexities bilaterally. Previously identified hematoma has been partially evacuated. Residual bleed measures approximately 4.1 x 2.2 x 2.8 cm. Residual extra-axial hemorrhage measures up to 8 mm in thickness. Improved mass effect and right-to-left shift, now measuring 3 mm. No hydrocephalus or trapping. Basilar cisterns remain patent. Abnormal hypodensity seen involving the adjacent anterior right frontal lobe, concerning for vasogenic edema (series 2, image 10). Findings raise the concern for an  underlying mass lesion at this location. No other visible mass lesion. No acute large vessel territory infarct. Underlying chronic microvascular ischemic disease with remote left basal ganglia lacunar infarct noted. Vascular: No abnormal hyperdense vessel. Calcified atherosclerosis present about the skull base. Skull: Sequelae of interval right-sided craniotomy. Bone flap well-positioned. Skin staples remain in place. Sinuses/Orbits: Globes orbital soft tissues within normal limits. Paranasal sinuses are largely clear. No mastoid effusion. Other: None. IMPRESSION: 1. Postoperative changes from interval right-sided craniotomy for subdural and intraparenchymal hematoma evacuation. Residual parenchymal bleed measures approximately 4.1 x 2.2 x 2.8 cm. Residual extra-axial collection measures up to 8 mm in thickness. Improved mass effect and right-to-left shift, now measuring 3 mm. No hydrocephalus or trapping. 2. Abnormal hypodensity involving the adjacent anterior right frontal lobe, concerning for vasogenic edema, and raising the concern for an underlying mass lesion at this location. Further evaluation with dedicated MRI, with and without contrast, suggested for further evaluation. 3. No other acute intracranial abnormality. Electronically Signed   By: Rise Mu M.D.   On: 07/09/2022 04:27   DG Chest Portable 1 View  Result Date: 07/08/2022 CLINICAL DATA:  Interval intubation and enteric tube placement EXAM: PORTABLE CHEST 1 VIEW COMPARISON:  None Available. FINDINGS: Lines/tubes: Endotracheal tube tip projects 2.8 cm above the carina. Enteric tube tip projects over the stomach with side hole 2 cm below the gastroesophageal junction. Chest: Lungs are clear without focal consolidation. Pleura: No pneumothorax or pleural effusion. Heart/mediastinum: The heart size and mediastinal contours are within normal limits. Bones: No acute osseous abnormality. IMPRESSION: 1. Endotracheal tube tip projects 2.8 cm  above the carina. 2. Enteric tube tip projects over the stomach with side hole 2 cm below the gastroesophageal junction. Consider advancing by 3 cm for more optimal positioning. Electronically Signed   By: Agustin Cree M.D.   On: 07/08/2022 15:54   CT HEAD CODE STROKE WO CONTRAST  Addendum Date: 07/08/2022   ADDENDUM REPORT: 07/08/2022 15:45 Electronically Signed   By: Orvan Falconer M.D   On: 07/08/2022 15:45   Addendum Date: 07/08/2022   ADDENDUM REPORT: 07/08/2022 15:45 Electronically Signed   By: Orvan Falconer M.D   On: 07/08/2022 15:45   Result Date: 07/08/2022 CLINICAL DATA:  Code stroke. EXAM: CT HEAD WITHOUT CONTRAST TECHNIQUE: Contiguous axial images were obtained from the base of the skull through the vertex without intravenous contrast. RADIATION DOSE REDUCTION: This exam was performed according to the departmental dose-optimization program which includes automated exposure control, adjustment of the mA and/or kV according to patient size and/or use of iterative reconstruction technique. COMPARISON:  None Available. FINDINGS: Brain: Acute intraparenchymal hematoma in the right frontal operculum measuring up to 40 by 39 by 41 mm. No intraventricular extension. Associated overlying subdural  hemorrhage measuring up to 10 mm in thickness along the right frontoparietal convexity. Associated 12 mm of leftward midline shift. Vascular: No hyperdense vessel. Skull: Normal. Negative for fracture or focal lesion. Sinuses/Orbits: No acute finding. Other: None. IMPRESSION: Large acute intraparenchymal hematoma within the right frontal operculum with associated right frontoparietal subdural hematoma and 12 mm of leftward midline shift. No intraventricular extension. Critical Value/emergent results were called by telephone at the time of interpretation on 07/08/2022 at 3:30 pm to provider [facility], who verbally acknowledged these results. Electronically Signed: By: Emmit Alexanders M.D On: 07/08/2022 15:33      PHYSICAL EXAM  Temp:  [94 F (34.4 C)-98 F (36.7 C)] 97.1 F (36.2 C) (01/10 0800) Pulse Rate:  [57-107] 73 (01/10 1230) Resp:  [12-22] 16 (01/10 1230) BP: (108-200)/(47-92) 137/59 (01/10 1230) SpO2:  [93 %-100 %] 100 % (01/10 1230) Arterial Line BP: (108-173)/(44-64) 149/58 (01/10 1230) FiO2 (%):  [30 %-100 %] 30 % (01/10 1105) Weight:  [55.5 kg-55.8 kg] 55.5 kg (01/10 0500)  General - Well nourished, well developed, intubated just off sedation.  Ophthalmologic - fundi not visualized due to noncooperation.  Cardiovascular - Regular rate and rhythm.  Neuro - intubated just off sedation, eyes closed, not following commands. With forced eye opening, eyes in mid position, not blinking to visual threat, doll's eyes absent, not tracking, pupils 1.8mm not reactive to light bilaterally. Corneal reflex weakly present, gag and cough present. Breathing over the vent.  Facial symmetry not able to test due to ET tube.  Tongue protrusion not cooperative. On pain stimulation, RUE and RLE mild withdraw to pain, no movement on the left. DTR 1+ and no babinski. Sensation, coordination and gait not tested.    ASSESSMENT/PLAN Gina Kelley is a 87 y.o. female with history of DM, HTN, HLD, CKD admitted for left sided weakness, left facial droop, right gaze and aphasia. No tPA given due to [place].    ICH - right frontoparietal cortical ICH with SDH s/p craniotomy - likely from brain tumor hemorrhage  CT head right frontal parietal ICH/SDH with midline shift 1.2 cm Neuro on board, status post SDH and ICH evacuation Post procedure CT repeat showed postsurgical changes with midline shift improved to 3 mm. Recommend MRI and MRA for further evaluation once stable Brain biopsy result pending SCDs for VTE prophylaxis aspirin 81 mg daily prior to admission, now on No antithrombotic given ICH On Keppra Therapy recommendations: Pending  Disposition: Pending  Diabetes Management per primary  team CBG monitoring SSI DM education and close PCP follow up  Hypertension Stable on Cleviprex Consider p.o. meds per tube to taper off Cleviprex BP goal less than 160 Long term BP goal normotensive  Hyperlipidemia Home meds: Lipitor 80 and Zetia 10 Consider resume home meds once stable Continue statin at discharge  Other Active Problems Possible brain tumor - further management per primary team, brain biopsy result pending  Hospital day # 1  Neurology will sign off. Please feel free to call with questions. Thanks for the consult.   Rosalin Hawking, MD PhD Stroke Neurology 07/09/2022 12:44 PM    To contact Stroke Continuity provider, please refer to [email or web address] After hours, contact General Neurology

## 2022-07-09 NOTE — Consult Note (Addendum)
NAME:  Gina Kelley, MRN:  [id number], DOB:  [birth year], LOS: 1 ADMISSION DATE:  07/08/2022 CONSULTATION DATE:  07/09/2022 REFERRING MD:  Jordan Likes - NSGY CHIEF COMPLAINT:  Vent management s/p crani for IPH   History of Present Illness:  87 year old woman who presented to [facility] ED via EMS 1/9 as a Code Stroke. LKW 1/9 1300. Found by family with L-sided weakness, R gaze deviation. PMHx significant for HTN, HLD, CVA (1998 with residual LLE numbness), T2DM, osteopenia.  On ED arrival, patient had persistent L-sided weakness and R gaze deviation as well as confusion and aphasia; she was hypertensive to SBP 200s. CT Head demonstrated right IPH with 56mm midline shift and right frontoparietal SDH. Neuro consulted with plan for aggressive BP control, Cleviprex and Labetalol started. Patient was intubated in the ED for airway protection in the setting of worsening GCS. NSGY consulted. Concern for possible malignancy/intrinsic brain tumor given irregularity around hemorrhage on CT and vasogenic edema.  Patient was taken to OR 1/9 for R craniotomy/SDH evacuation and possible biopsy versus resection. Intraoperative course was unremarkable; biopsy of suspicious tissue sent to pathology. Postoperative repeat CT Head 1/10 demonstrated resolution of SDH post-crani and some residual hemorrhage without evidence of mass effect.   PCCM consulted for vent management.  Pertinent Medical History:   Past Medical History:  Diagnosis Date   Anemia of chronic disease 07/08/2006   Antral ulcer 02/23/2007   Seen on EGD in 2008, small ulcer with erosion    Cerebral vascular accident (HCC) 04/14/2006   1998, left lower extremity numbness, no residual deficits    Essential hypertension 04/14/2006   Gastroesophageal reflux disease 02/18/2013   Occasional, symptomatically relieved with peptobismol    Hyperlipidemia LDL goal < 100 04/14/2006   Hypertensive retinopathy of both eyes, grade 1 05/15/2016   Osteopenia of right femoral  neck 10/20/2016   DEXA (10/16/2016): R femur T -2.5 (FRAX tool calculates at -2.4), L1-L4 spine T -0.9, 10 year risk for: Major osteoporotic fracture 8.3%, Hip fracture 2.7%   Type 2 diabetes mellitus with stage 1 chronic kidney disease, without long-term current use of insulin (HCC)    Type II diabetes mellitus (HCC) 04/14/2006   Significant Hospital Events: Including procedures, antibiotic start and stop dates in addition to other pertinent events   1/9 - Presented via EMS as Code Stroke. CT Head with R IPH/SDH, vasogenic edema c/f ?malignant process. Cleviprex for BP control. Intubated for airway protection. Taken to OR for R crani/SDH evacuation. 1/10 - Repeat CT Head with resolution of SDH, some residual hemorrhage, no mass effect. PCCM consulted for assistance with vent management.  Interim History / Subjective:  PCCM consulted for assistance with ventilator management  Objective:  Blood pressure (!) 132/55, pulse 62, temperature (!) 97.1 F (36.2 C), temperature source Axillary, resp. rate 16, height 5\' 3"  (1.6 m), weight 55.5 kg, SpO2 100 %.    Vent Mode: PRVC FiO2 (%):  [30 %-100 %] 30 % Set Rate:  [16 bmp-18 bmp] 16 bmp Vt Set:  [410 mL] 410 mL PEEP:  [5 cmH20] 5 cmH20 Plateau Pressure:  [8 cmH20-14 cmH20] 14 cmH20   Intake/Output Summary (Last 24 hours) at 07/09/2022 0947 Last data filed at 07/09/2022 0900 Gross per 24 hour  Intake 2313.78 ml  Output 1537 ml  Net 776.78 ml   Filed Weights   07/08/22 1500 07/08/22 1623 07/09/22 0500  Weight: 55.8 kg 55.8 kg 55.5 kg   Physical Examination: General: Acutely ill-appearing elderly woman in NAD.  Intubated, mildly sedated. HEENT: R craniotomy incision covered with dressing, c/d/i with staple closure. Anicteric sclera, R pupil 2.67mm and reactive, L pupil 78mm and reactive, moist mucous membranes. ETT/OGT in place. Neuro: Sedated. Withdraws to pain in RUE, RLE. Flexion with pain in LLE. No response in LUE. Purposeful movement  toward stimulus in RUE. Not following commands. Unilateral L-sided neglect noted. +Corneal and +Cough  CV: RRR, no m/g/r. PULM: Breathing even and unlabored on vent (PEEP 5, FiO2 30%). Lung fields CTAB anteriorly. GI: Soft, nontender, nondistended. Normoactive bowel sounds. Extremities: No LE edema noted. Skin: Warm/dry, no rashes.  Resolved Hospital Problem List:    Assessment & Plan:  Right frontotemporal IPH R subdural hematoma, s/p evacuation Vasogenic edema with brain compression Presented 1/9 as Code Stroke. CT Head with R IPH/SDH, vasogenic edema c/f malignancy. S/p OR 1/9 for R craniotomy/SDH evacuation and biopsy. Postoperative repeat CT Head 1/10 demonstrated resolution of SDH post-crani and some residual hemorrhage without evidence of mass effect.  - NSGY primary, remains in ICU - Neurology following - Goal SBP 130-150 - Cleviprex titrated to goal SBP - Received Decadron x 1 - AEDs per Neuro, seizure precautions - Additional imaging per NSGY/Neuro - F/u surgical pathology - Neuroprotective measures: HOB > 30 degrees, normoglycemia, normothermia, electrolytes WNL - PT/OT/SLP when able to participate in care  Acute hypoxemic respiratory failure requiring mechanical ventilation - Continue full vent support (4-8cc/kg IBW) - Wean FiO2 for O2 sat > 90% - Daily WUA/SBT as mental status tolerates; weaning sedation to attempt PSV today 1/10 - VAP bundle - Pulmonary hygiene - PAD protocol for sedation: Propofol and Fentanyl for goal RASS 0 to -1 - Follow CXR, ABG  Hypertension Hyperlipidemia Prior CVA 1998 - Hold home antihypertensives (Norvasc, Cozaar) for now, once BP trend more stable resume to facilitate Cleviprex weaning - Goal SBP 130-150 as above - Defer ASA resumption to NSGY, pending progress - Continue Lipitor, Zetia - Cardiac monitoring  Hypokalemia CKD stage 1 - Trend BMP - Replete electrolytes as indicated - Monitor I&Os - Avoid nephrotoxic agents as  able  T2DM - SSI - CBGs Q4H - Goal CBG 140-180  Best Practice: (right click and "Reselect all SmartList Selections" daily)   Diet/type: NPO DVT prophylaxis: SCDs, no AC in the setting of IPH GI prophylaxis: PPI Lines: N/A Foley:  Yes, and it is still needed Code Status:  full code Last date of multidisciplinary goals of care discussion [Per Primary Team - NSGY]  Labs:  CBC: Recent Labs  Lab 07/08/22 1509 07/08/22 1512 07/08/22 1627 07/08/22 2027 07/09/22 0523  WBC 7.5  --   --   --  7.7  NEUTROABS 3.5  --   --   --   --   HGB 10.4* 11.6* 9.5* 8.5* 7.6*  HCT 31.3* 34.0* 28.0* 25.0* 24.1*  MCV 91.0  --   --   --  93.4  PLT 256  --   --   --  546   Basic Metabolic Panel: Recent Labs  Lab 07/08/22 1509 07/08/22 1512 07/08/22 1627 07/08/22 2027 07/09/22 0523  NA 137 141 139 144 138  K 3.2* 3.3* 3.2* 2.8* 3.1*  CL 104 102  --   --  106  CO2 26  --   --   --  23  GLUCOSE 156* 158*  --   --  251*  BUN 19 23  --   --  19  CREATININE 0.71 0.60  --   --  0.78  CALCIUM 9.1  --   --   --  7.8*   GFR: Estimated Creatinine Clearance: 43.3 mL/min (by C-G formula based on SCr of 0.78 mg/dL). Recent Labs  Lab 07/08/22 1509 07/09/22 0523  WBC 7.5 7.7   Liver Function Tests: Recent Labs  Lab 07/08/22 1509  AST 38  ALT 21  ALKPHOS 60  BILITOT 0.6  PROT 7.5  ALBUMIN 3.6   No results for input(s): "LIPASE", "AMYLASE" in the last 168 hours. No results for input(s): "AMMONIA" in the last 168 hours.  ABG:    Component Value Date/Time   PHART 7.409 07/08/2022 2027   PCO2ART 38.5 07/08/2022 2027   PO2ART 183 (H) 07/08/2022 2027   HCO3 24.3 07/08/2022 2027   TCO2 25 07/08/2022 2027   O2SAT 100 07/08/2022 2027    Coagulation Profile: Recent Labs  Lab 07/08/22 1509  INR 1.1   Cardiac Enzymes: No results for input(s): "CKTOTAL", "CKMB", "CKMBINDEX", "TROPONINI" in the last 168 hours.  HbA1C: Hemoglobin A1C  Date/Time Value Ref Range Status  07/12/2021  10:37 AM 6.1 (A) 4.0 - 5.6 % Final  11/23/2020 09:25 AM 5.9 (A) 4.0 - 5.6 % Final   Hgb A1c MFr Bld  Date/Time Value Ref Range Status  04/03/2010 04:14 PM 6.0 %   07/13/2009 01:43 PM 6.1 %    CBG: Recent Labs  Lab 07/08/22 1508  GLUCAP 153*   Review of Systems:   Patient is encephalopathic and/or intubated. Therefore history has been obtained from chart review.   Past Medical History:  She,  has a past medical history of Anemia of chronic disease (07/08/2006), Antral ulcer (02/23/2007), Cerebral vascular accident ([place]) (04/14/2006), Essential hypertension (04/14/2006), Gastroesophageal reflux disease (02/18/2013), Hyperlipidemia LDL goal < 100 (04/14/2006), Hypertensive retinopathy of both eyes, grade 1 (05/15/2016), Osteopenia of right femoral neck (10/20/2016), Type 2 diabetes mellitus with stage 1 chronic kidney disease, without long-term current use of insulin ([place]), and Type II diabetes mellitus ([place]) (04/14/2006).   Surgical History:   Past Surgical History:  Procedure Laterality Date   COLONOSCOPY     ESOPHAGOGASTRODUODENOSCOPY      Social History:   reports that she quit smoking about 44 years ago. She has never used smokeless tobacco. She reports that she does not drink alcohol and does not use drugs.   Family History:  Her family history includes Anuerysm (age of onset: 53) in her father; Asthma in her brother and sister; Breast cancer in her sister; Cirrhosis in her brother; Diabetes in her mother; Hypertension in her mother and son; Pulmonary disease in her brother; Stroke in her mother and sister.   Allergies: Allergies  Allergen Reactions   Enalapril Cough    Home Medications: Prior to Admission medications   Medication Sig Start Date End Date Taking? Authorizing Provider  amLODipine (NORVASC) 5 MG tablet Take 1 tablet (5 mg total) by mouth daily. 05/14/22  Yes Sid Falcon, MD  aspirin 81 MG chewable tablet Chew 81 mg by mouth daily.   Yes [provider]  atorvastatin (LIPITOR) 80 MG tablet Take 1 tablet by mouth once daily Patient taking differently: Take 80 mg by mouth daily. 05/21/21  Yes Angelica Pou, MD  Cholecalciferol (VITAMIN D3) 75 MCG (3000 UT) TABS Take 1 tablet by mouth daily. Patient taking differently: Take 3,000 Units by mouth daily. 11/18/19  Yes Sid Falcon, MD  ezetimibe (ZETIA) 10 MG tablet Take 1 tablet (10 mg total) by mouth daily. 05/14/22  Yes Daryll Drown,  Dillard Cannon, MD  losartan (COZAAR) 25 MG tablet Take 1 tablet by mouth once daily 05/14/22  Yes Inez Catalina, MD  Multiple Vitamins-Minerals (PRESERVISION AREDS PO) Take 1 tablet by mouth in the morning and at bedtime.   Yes [provider]  alendronate (FOSAMAX) 70 MG tablet TAKE ONE TABLET BY MOUTH EVERY 7 DAYS. TAKE WITH A FULL GLASS OF WATER ON AN EMPTY STOMACH Patient not taking: Reported on 07/09/2022 12/21/21   Inez Catalina, MD    Critical care time: 38 minutes   The patient is critically ill with multiple organ system failure and requires high complexity decision making for assessment and support, frequent evaluation and titration of therapies, advanced monitoring, review of radiographic studies and interpretation of complex data.   Critical Care Time devoted to patient care services, exclusive of separately billable procedures, described in this note is 38 minutes.  Tim Lair, PA-C [facility] 07/09/22 11:02 AM  Please see [email or web address] for pager details.  From 7A-7P if no response, please call [phone] After hours, please call ELink [phone]

## 2022-07-10 ENCOUNTER — Inpatient Hospital Stay (HOSPITAL_COMMUNITY): Payer: Medicare HMO

## 2022-07-10 ENCOUNTER — Encounter (HOSPITAL_COMMUNITY): Payer: Self-pay | Admitting: Neurosurgery

## 2022-07-10 DIAGNOSIS — S065XAA Traumatic subdural hemorrhage with loss of consciousness status unknown, initial encounter: Secondary | ICD-10-CM

## 2022-07-10 DIAGNOSIS — I6200 Nontraumatic subdural hemorrhage, unspecified: Secondary | ICD-10-CM | POA: Diagnosis not present

## 2022-07-10 DIAGNOSIS — G9389 Other specified disorders of brain: Secondary | ICD-10-CM | POA: Diagnosis not present

## 2022-07-10 DIAGNOSIS — I619 Nontraumatic intracerebral hemorrhage, unspecified: Secondary | ICD-10-CM | POA: Diagnosis not present

## 2022-07-10 DIAGNOSIS — I639 Cerebral infarction, unspecified: Secondary | ICD-10-CM | POA: Diagnosis not present

## 2022-07-10 DIAGNOSIS — E440 Moderate protein-calorie malnutrition: Secondary | ICD-10-CM | POA: Insufficient documentation

## 2022-07-10 DIAGNOSIS — R4182 Altered mental status, unspecified: Secondary | ICD-10-CM

## 2022-07-10 DIAGNOSIS — G936 Cerebral edema: Secondary | ICD-10-CM | POA: Diagnosis not present

## 2022-07-10 LAB — BASIC METABOLIC PANEL
Anion gap: 6 (ref 5–15)
BUN: 13 mg/dL (ref 8–23)
CO2: 23 mmol/L (ref 22–32)
Calcium: 7.6 mg/dL — ABNORMAL LOW (ref 8.9–10.3)
Chloride: 109 mmol/L (ref 98–111)
Creatinine, Ser: 0.69 mg/dL (ref 0.44–1.00)
GFR, Estimated: >60 mL/min (ref >60)
Glucose, Bld: 128 mg/dL — ABNORMAL HIGH (ref 70–99)
Potassium: 3.5 mmol/L (ref 3.5–5.1)
Sodium: 138 mmol/L (ref 135–145)

## 2022-07-10 LAB — PHOSPHORUS
Phosphorus: 2.2 mg/dL — ABNORMAL LOW (ref 2.5–4.6)
Phosphorus: 1.9 mg/dL — ABNORMAL LOW (ref 2.5–4.6)

## 2022-07-10 LAB — MAGNESIUM
Magnesium: 1.6 mg/dL — ABNORMAL LOW (ref 1.7–2.4)
Magnesium: 2.8 mg/dL — ABNORMAL HIGH (ref 1.7–2.4)

## 2022-07-10 LAB — CBC
HCT: 23.6 % — ABNORMAL LOW (ref 36.0–46.0)
Hemoglobin: 7.7 g/dL — ABNORMAL LOW (ref 12.0–15.0)
MCH: 30.2 pg (ref 26.0–34.0)
MCHC: 32.6 g/dL (ref 30.0–36.0)
MCV: 92.5 fL (ref 80.0–100.0)
Platelets: 169 10*3/uL (ref 150–400)
RBC: 2.55 MIL/uL — ABNORMAL LOW (ref 3.87–5.11)
RDW: 16.4 % — ABNORMAL HIGH (ref 11.5–15.5)
WBC: 13.2 10*3/uL — ABNORMAL HIGH (ref 4.0–10.5)
nRBC: 0 % (ref 0.0–0.2)

## 2022-07-10 LAB — SURGICAL PATHOLOGY

## 2022-07-10 LAB — GLUCOSE, CAPILLARY
Glucose-Capillary: 114 mg/dL — ABNORMAL HIGH (ref 70–99)
Glucose-Capillary: 110 mg/dL — ABNORMAL HIGH (ref 70–99)
Glucose-Capillary: 117 mg/dL — ABNORMAL HIGH (ref 70–99)
Glucose-Capillary: 143 mg/dL — ABNORMAL HIGH (ref 70–99)
Glucose-Capillary: 164 mg/dL — ABNORMAL HIGH (ref 70–99)

## 2022-07-10 MED ORDER — OSMOLITE 1.2 CAL PO LIQD
1000.0000 mL | ORAL | Status: DC
  Administered 2022-07-10: 1000 mL

## 2022-07-10 MED ORDER — PROSOURCE TF20 ENFIT COMPATIBL EN LIQD
60.0000 mL | Freq: Every day | ENTERAL | Status: DC
  Administered 2022-07-10 – 2022-07-11 (×2): 60 mL
  Filled 2022-07-10 (×2): qty 60

## 2022-07-10 MED ORDER — AMLODIPINE BESYLATE 5 MG PO TABS
5.0000 mg | ORAL_TABLET | Freq: Every day | ORAL | Status: DC
  Administered 2022-07-10: 5 mg
  Filled 2022-07-10: qty 1

## 2022-07-10 MED ORDER — LOSARTAN POTASSIUM 50 MG PO TABS
25.0000 mg | ORAL_TABLET | Freq: Every day | ORAL | Status: DC
  Administered 2022-07-10 – 2022-07-12 (×3): 25 mg
  Filled 2022-07-10 (×3): qty 1

## 2022-07-10 MED ORDER — MAGNESIUM SULFATE 4 GM/100ML IV SOLN
4.0000 g | Freq: Once | INTRAVENOUS | Status: AC
  Administered 2022-07-10: 4 g via INTRAVENOUS
  Filled 2022-07-10: qty 100

## 2022-07-10 MED ORDER — CHLORHEXIDINE GLUCONATE CLOTH 2 % EX PADS
6.0000 | MEDICATED_PAD | Freq: Every day | CUTANEOUS | Status: DC
  Administered 2022-07-10 – 2022-07-29 (×19): 6 via TOPICAL

## 2022-07-10 NOTE — Progress Notes (Signed)
NAME:  Gina Kelley, MRN:  [id number], DOB:  1935-08-05, LOS: 2 ADMISSION DATE:  07/08/2022 CONSULTATION DATE:  07/09/2022 REFERRING MD:  Jordan Likes - NSGY CHIEF COMPLAINT:  Vent management s/p crani for IPH   History of Present Illness:  87 year old woman who presented to [facility] ED via EMS 1/9 as a Code Stroke. LKW 1/9 1300. Found by family with L-sided weakness, R gaze deviation. PMHx significant for HTN, HLD, CVA (1998 with residual LLE numbness), T2DM, osteopenia.  On ED arrival, patient had persistent L-sided weakness and R gaze deviation as well as confusion and aphasia; she was hypertensive to SBP 200s. CT Head demonstrated right IPH with 41mm midline shift and right frontoparietal SDH. Neuro consulted with plan for aggressive BP control, Cleviprex and Labetalol started. Patient was intubated in the ED for airway protection in the setting of worsening GCS. NSGY consulted. Concern for possible malignancy/intrinsic brain tumor given irregularity around hemorrhage on CT and vasogenic edema.  Patient was taken to OR 1/9 for R craniotomy/SDH evacuation and possible biopsy versus resection. Intraoperative course was unremarkable; biopsy of suspicious tissue sent to pathology. Postoperative repeat CT Head 1/10 demonstrated resolution of SDH post-crani and some residual hemorrhage without evidence of mass effect.   PCCM consulted for vent management.  Pertinent Medical History:   Past Medical History:  Diagnosis Date   Anemia of chronic disease 07/08/2006   Antral ulcer 02/23/2007   Seen on EGD in 2008, small ulcer with erosion    Cerebral vascular accident (HCC) 04/14/2006   1998, left lower extremity numbness, no residual deficits    Essential hypertension 04/14/2006   Gastroesophageal reflux disease 02/18/2013   Occasional, symptomatically relieved with peptobismol    Hyperlipidemia LDL goal < 100 04/14/2006   Hypertensive retinopathy of both eyes, grade 1 05/15/2016   Osteopenia of right femoral  neck 10/20/2016   DEXA (10/16/2016): R femur T -2.5 (FRAX tool calculates at -2.4), L1-L4 spine T -0.9, 10 year risk for: Major osteoporotic fracture 8.3%, Hip fracture 2.7%   Type 2 diabetes mellitus with stage 1 chronic kidney disease, without long-term current use of insulin (HCC)    Type II diabetes mellitus (HCC) 04/14/2006   Significant Hospital Events: Including procedures, antibiotic start and stop dates in addition to other pertinent events   1/9 - Presented via EMS as Code Stroke. CT Head with R IPH/SDH, vasogenic edema c/f ?malignant process. Cleviprex for BP control. Intubated for airway protection. Taken to OR for R crani/SDH evacuation. 1/10 - Repeat CT Head with resolution of SDH, some residual hemorrhage, no mass effect. PCCM consulted for assistance with vent management.  Interim History / Subjective:  No overnight events Off Cleviprex Propofol weaned off, pt remains unresponsive   Objective:  Blood pressure (!) 140/53, pulse 82, temperature 99.8 F (37.7 C), temperature source Axillary, resp. rate 11, height 5\' 3"  (1.6 m), weight 57.9 kg, SpO2 100 %.    Vent Mode: PRVC FiO2 (%):  [30 %] 30 % Set Rate:  [16 bmp] 16 bmp Vt Set:  [410 mL] 410 mL PEEP:  [5 cmH20] 5 cmH20 Plateau Pressure:  [8 cmH20-13 cmH20] 13 cmH20   Intake/Output Summary (Last 24 hours) at 07/10/2022 0806 Last data filed at 07/10/2022 0600 Gross per 24 hour  Intake 1781.53 ml  Output 1080 ml  Net 701.53 ml    Filed Weights   07/08/22 1623 07/09/22 0500 07/10/22 0500  Weight: 55.8 kg 55.5 kg 57.9 kg     General:  critically ill  elderly F intubated and mechanically ventilated HEENT: MM pink/moist, sclera anicteric Neuro: withdraws to painful stimuli and triggering vent, but not otherwise responsive off sedation CV: s1s2 rrr, no m/r/g PULM:  clear bilaterally on PSV, respirations even and unlabored GI: soft, bsx4 active  Extremities: warm/dry, no edema  Skin: no rashes or  lesions     Resolved Hospital Problem List:    Assessment & Plan:     Right frontotemporal IPH R subdural hematoma, s/p evacuation Vasogenic edema with brain compression Presented 1/9 as Code Stroke. CT Head with R IPH/SDH, vasogenic edema c/f malignancy. S/p OR 1/9 for R craniotomy/SDH evacuation and biopsy. Postoperative repeat CT Head 1/10 demonstrated resolution of SDH post-crani and some residual hemorrhage without evidence of mass effect.  -minimally responsive off propofol, obtain EEG to eval for subclinical sz - NSGY primary, remains in ICU - Neurology following - Goal SBP 130-150 - Cleviprex titrated off, resume home Norvasc and Amlodipine - Received Decadron x 1 - AEDs per Neuro, seizure precautions, on Keppra  - Additional imaging per NSGY/Neuro - F/u surgical pathology - Neuroprotective measures: HOB > 30 degrees, normoglycemia, normothermia, electrolytes WNL - PT/OT/SLP when able to participate in care  Acute hypoxemic respiratory failure requiring mechanical ventilation - Continue full vent support (4-8cc/kg IBW) - Wean FiO2 for O2 sat > 90% - Daily WUA/SBT as mental status tolerates; weaning sedation to attempt PSV today 1/11, mental status precludes extubation - VAP bundle - Pulmonary hygiene - PAD protocol for sedation: Propofol and Fentanyl for goal RASS 0 to -1 - Follow CXR, ABG  Hypertension Hyperlipidemia Prior CVA 1998 - Norvasc and cozaar resumed - Goal SBP 130-150 as above - Defer ASA resumption to NSGY, pending progress - Continue Lipitor, Zetia - Cardiac monitoring  Hypokalemia CKD stage 1 - Trend BMP - Replete electrolytes as indicated - Monitor I&Os - Avoid nephrotoxic agents as able  T2DM - SSI - CBGs Q4H - Goal CBG 140-180  Best Practice: (right click and "Reselect all SmartList Selections" daily)   Diet/type: tubefeeds DVT prophylaxis: SCDs, no AC in the setting of IPH GI prophylaxis: PPI Lines: N/A Foley:  Yes, and it is  still needed Code Status:  full code Last date of multidisciplinary goals of care discussion [Per Primary Team - NSGY]  Labs:  CBC: Recent Labs  Lab 07/08/22 1509 07/08/22 1512 07/08/22 1627 07/08/22 2027 07/09/22 0523 07/10/22 0453  WBC 7.5  --   --   --  7.7 13.2*  NEUTROABS 3.5  --   --   --   --   --   HGB 10.4* 11.6* 9.5* 8.5* 7.6* 7.7*  HCT 31.3* 34.0* 28.0* 25.0* 24.1* 23.6*  MCV 91.0  --   --   --  93.4 92.5  PLT 256  --   --   --  172 169    Basic Metabolic Panel: Recent Labs  Lab 07/08/22 1509 07/08/22 1512 07/08/22 1627 07/08/22 2027 07/09/22 0523 07/10/22 0453  NA 137 141 139 144 138 138  K 3.2* 3.3* 3.2* 2.8* 3.1* 3.5  CL 104 102  --   --  106 109  CO2 26  --   --   --  23 23  GLUCOSE 156* 158*  --   --  251* 128*  BUN 19 23  --   --  19 13  CREATININE 0.71 0.60  --   --  0.78 0.69  CALCIUM 9.1  --   --   --  7.8* 7.6*  MG  --   --   --   --   --  1.6*    GFR: Estimated Creatinine Clearance: 43.3 mL/min (by C-G formula based on SCr of 0.69 mg/dL). Recent Labs  Lab 07/08/22 1509 07/09/22 0523 07/10/22 0453  WBC 7.5 7.7 13.2*    Liver Function Tests: Recent Labs  Lab 07/08/22 1509  AST 38  ALT 21  ALKPHOS 60  BILITOT 0.6  PROT 7.5  ALBUMIN 3.6    No results for input(s): "LIPASE", "AMYLASE" in the last 168 hours. No results for input(s): "AMMONIA" in the last 168 hours.  ABG:    Component Value Date/Time   PHART 7.409 07/08/2022 2027   PCO2ART 38.5 07/08/2022 2027   PO2ART 183 (H) 07/08/2022 2027   HCO3 24.3 07/08/2022 2027   TCO2 25 07/08/2022 2027   O2SAT 100 07/08/2022 2027    Coagulation Profile: Recent Labs  Lab 07/08/22 1509  INR 1.1    Cardiac Enzymes: No results for input(s): "CKTOTAL", "CKMB", "CKMBINDEX", "TROPONINI" in the last 168 hours.  HbA1C: Hemoglobin A1C  Date/Time Value Ref Range Status  07/12/2021 10:37 AM 6.1 (A) 4.0 - 5.6 % Final  11/23/2020 09:25 AM 5.9 (A) 4.0 - 5.6 % Final   Hgb A1c MFr  Bld  Date/Time Value Ref Range Status  04/03/2010 04:14 PM 6.0 %   07/13/2009 01:43 PM 6.1 %    CBG: Recent Labs  Lab 07/09/22 1128 07/09/22 1515 07/09/22 2005 07/09/22 2348 07/10/22 0414  GLUCAP 165* 122* 114* 133* 114*    Review of Systems:   Patient is encephalopathic and/or intubated. Therefore history has been obtained from chart review.   Past Medical History:  She,  has a past medical history of Anemia of chronic disease (07/08/2006), Antral ulcer (02/23/2007), Cerebral vascular accident ([place]) (04/14/2006), Essential hypertension (04/14/2006), Gastroesophageal reflux disease (02/18/2013), Hyperlipidemia LDL goal < 100 (04/14/2006), Hypertensive retinopathy of both eyes, grade 1 (05/15/2016), Osteopenia of right femoral neck (10/20/2016), Type 2 diabetes mellitus with stage 1 chronic kidney disease, without long-term current use of insulin ([place]), and Type II diabetes mellitus ([place]) (04/14/2006).   Surgical History:   Past Surgical History:  Procedure Laterality Date   COLONOSCOPY     ESOPHAGOGASTRODUODENOSCOPY      Social History:   reports that she quit smoking about 44 years ago. She has never used smokeless tobacco. She reports that she does not drink alcohol and does not use drugs.   Family History:  Her family history includes Anuerysm (age of onset: 55) in her father; Asthma in her brother and sister; Breast cancer in her sister; Cirrhosis in her brother; Diabetes in her mother; Hypertension in her mother and son; Pulmonary disease in her brother; Stroke in her mother and sister.   Allergies: Allergies  Allergen Reactions   Enalapril Cough    Home Medications: Prior to Admission medications   Medication Sig Start Date End Date Taking? Authorizing Provider  amLODipine (NORVASC) 5 MG tablet Take 1 tablet (5 mg total) by mouth daily. 05/14/22  Yes Sid Falcon, MD  aspirin 81 MG chewable tablet Chew 81 mg by mouth daily.   Yes [provider]  atorvastatin  (LIPITOR) 80 MG tablet Take 1 tablet by mouth once daily Patient taking differently: Take 80 mg by mouth daily. 05/21/21  Yes Angelica Pou, MD  Cholecalciferol (VITAMIN D3) 75 MCG (3000 UT) TABS Take 1 tablet by mouth daily. Patient taking differently: Take 3,000 Units by  mouth daily. 11/18/19  Yes Sid Falcon, MD  ezetimibe (ZETIA) 10 MG tablet Take 1 tablet (10 mg total) by mouth daily. 05/14/22  Yes Sid Falcon, MD  losartan (COZAAR) 25 MG tablet Take 1 tablet by mouth once daily 05/14/22  Yes Sid Falcon, MD  Multiple Vitamins-Minerals (PRESERVISION AREDS PO) Take 1 tablet by mouth in the morning and at bedtime.   Yes [provider]  alendronate (FOSAMAX) 70 MG tablet TAKE ONE TABLET BY MOUTH EVERY 7 DAYS. TAKE WITH A FULL GLASS OF WATER ON AN EMPTY STOMACH Patient not taking: Reported on 07/09/2022 12/21/21   Sid Falcon, MD    Critical care time: 35 minutes   The patient is critically ill with multiple organ system failure and requires high complexity decision making for assessment and support, frequent evaluation and titration of therapies, advanced monitoring, review of radiographic studies and interpretation of complex data.   Critical Care Time devoted to patient care services, exclusive of separately billable procedures, described in this note is 38 minutes.  Otilio Carpen Hershal Eriksson, PA-C [facility] 07/10/22 8:06 AM  Please see [email or web address] for pager details.  From 7A-7P if no response, please call [phone] After hours, please call ELink [phone]

## 2022-07-10 NOTE — Progress Notes (Signed)
eLink Physician-Brief Progress Note Patient Name: Gina Kelley DOB: Oct 30, 1935 MRN: [id number]   Date of Service  07/10/2022  HPI/Events of Note  Agitation - Patient reaching for ETT. Nursing request for R wrist restraint.   eICU Interventions  Plan: Will order R soft wrist restraint X 12 hours.      Intervention Category Major Interventions: Delirium, psychosis, severe agitation - evaluation and management  Chrystel Barefield [place] 07/10/2022, 8:26 PM

## 2022-07-10 NOTE — Procedures (Signed)
Patient Name: Gina Kelley  MRN: [id number]  Epilepsy Attending: Lora Havens  Referring Physician/Provider: Gleason, Otilio Carpen, PA-C  Date: 07/10/2022 Duration: 22.55 mins  Patient history: 87 year old female with right frontoparietal ICH with SDH status post craniotomy.  EEG to evaluate for seizure.  Level of alertness: comatose  AEDs during EEG study: LEV, propofol  Technical aspects: This EEG study was done with scalp electrodes positioned according to the 10-20 International system of electrode placement. Electrical activity was reviewed with band pass filter of 1-70Hz , sensitivity of 7 uV/mm, display speed of 66mm/sec with a 60Hz  notched filter applied as appropriate. EEG data were recorded continuously and digitally stored.  Video monitoring was available and reviewed as appropriate.  Description: EEG showed continuous generalized and maximal right frontotemporal region 3-6 Hz theta- delta slowing.  Sharp transients were noted in right frontal region. Hyperventilation and photic stimulation were not performed.     ABNORMALITY -Continuous slow, generalized and maximal right frontotemporal region  IMPRESSION: This study is suggestive of cortical dysfunction in right frontotemporal region likely secondary to underlying structural abnormality.  Additionally there is moderate to severe diffuse encephalopathy, nonspecific etiology.  No seizures or definite epileptiform discharges were seen throughout the recording.  [name]

## 2022-07-10 NOTE — Progress Notes (Signed)
Transported pt. To ct via vent with no incident. 

## 2022-07-10 NOTE — Progress Notes (Signed)
EEG complete - results pending 

## 2022-07-10 NOTE — Progress Notes (Signed)
Initial Nutrition Assessment  DOCUMENTATION CODES:   Non-severe (moderate) malnutrition in context of chronic illness  INTERVENTION:   Initiate tube feeding via OG tube: Osmolite 1.2 at 20 ml/h and increase by 10 ml every 8 hours to goal rate of 50 ml/hr (1200 ml per day) Prosource TF20 60 ml daily  Provides 1520 kcal, 86 gm protein, 973 ml free water daily  Monitor magnesium and phosphorus every 12 hours x 4 occurrences, MD to replete as needed, as pt is at risk for refeeding syndrome given moderate malnutrition.   NUTRITION DIAGNOSIS:   Moderate Malnutrition related to chronic illness as evidenced by moderate fat depletion, moderate muscle depletion.  GOAL:   Patient will meet greater than or equal to 90% of their needs  MONITOR:   TF tolerance  REASON FOR ASSESSMENT:   Consult Enteral/tube feeding initiation and management  ASSESSMENT:   Pt with PMH of DM, HTN, HLD, CKD admitted with ICH with SDH s/p craniotomy.    Pt discussed during ICU rounds and with RN. Off sedation this am, remains intubated. No plans for extubation for now. Ok to start nutrition.  Spoke with pt's 2 daughters (1 local and 1 from [place]) and husband. She is a great cook but only nibbles on food throughout the day never really stopping for a full meal. She tends to share plate with husband. Pt eats with dentures. No mobility issues  They report weight loss over the last 2 months but are unsure of her usual weight. Daughter had scheduled a PCP appointment due to weight loss.   1/9 - s/p R crani and biopsy of suspicious tissue worrisome for metastatic disease per MD  Medications reviewed and include: 2-6 units novolog every 4 hours, protonix  NS @ 75 ml/hr  Labs reviewed: Magnesium 1.6   16 F OG tube: per xray 1/9 tube needs to be advanced   NUTRITION - FOCUSED PHYSICAL EXAM:  Flowsheet Row Most Recent Value  Orbital Region No depletion  Upper Arm Region Moderate depletion  Thoracic and  Lumbar Region Moderate depletion  Buccal Region Unable to assess  Temple Region Unable to assess  Clavicle Bone Region Severe depletion  Clavicle and Acromion Bone Region Severe depletion  Scapular Bone Region Severe depletion  Dorsal Hand Unable to assess  Patellar Region Moderate depletion  Anterior Thigh Region Moderate depletion  Posterior Calf Region Moderate depletion  Edema (RD Assessment) --  [facial]  Hair Unable to assess  [crani cap]  Eyes Unable to assess  Mouth Unable to assess  Skin Unable to assess  Nails Reviewed       Diet Order:   Diet Order             Diet NPO time specified  Diet effective now                   EDUCATION NEEDS:   Not appropriate for education at this time  Skin:     Last BM:  unknown  Height:   Ht Readings from Last 1 Encounters:  07/08/22 5\' 3"  (1.6 m)    Weight:   Wt Readings from Last 1 Encounters:  07/10/22 57.9 kg    BMI:  Body mass index is 22.61 kg/m.  Estimated Nutritional Needs:   Kcal:  1450-1650  Protein:  75-90 grams  Fluid:  > 1.5 L/day  Lockie Pares., RD, LDN, CNSC See AMiON for contact information

## 2022-07-10 NOTE — Progress Notes (Signed)
Postop day 2.  Patient remains on the ventilator.  Sedation off.  She is beginning to wean.  Afebrile.  Vital signs are stable.  Anemia slightly worse but patient not symptomatic.  Urine output good.  No awakening to noxious stimuli.  Purposeful movements with the right side.  Minimal flexion on the left.  Wound clean and dry.  Chest and abdomen benign.  Overall progressing as would be expected following craniotomy for subdural/intracerebral hemorrhage.  Continue supportive efforts.  Recheck CT scan in morning.

## 2022-07-10 NOTE — Progress Notes (Signed)
Pharmacy Electrolyte Replacement  Recent Labs:  Recent Labs    07/10/22 0453  K 3.5  MG 1.6*  CREATININE 0.69    Low Critical Values (K </= 2.5, Phos </= 1, Mg </= 1) Present: None  MD Contacted: n/a - no critical values noted  Plan: Mag sulfate 4g IV x 1 Recheck with AM labs per protocol   Arturo Morton, PharmD, BCPS Please check AMION for all [place] contact numbers Clinical Pharmacist 07/10/2022 7:53 AM

## 2022-07-11 DIAGNOSIS — Z9911 Dependence on respirator [ventilator] status: Secondary | ICD-10-CM

## 2022-07-11 DIAGNOSIS — I639 Cerebral infarction, unspecified: Secondary | ICD-10-CM | POA: Diagnosis not present

## 2022-07-11 LAB — BASIC METABOLIC PANEL
Anion gap: 5 (ref 5–15)
Anion gap: 6 (ref 5–15)
BUN: 14 mg/dL (ref 8–23)
BUN: 16 mg/dL (ref 8–23)
CO2: 22 mmol/L (ref 22–32)
CO2: 22 mmol/L (ref 22–32)
Calcium: 7.5 mg/dL — ABNORMAL LOW (ref 8.9–10.3)
Calcium: 7.5 mg/dL — ABNORMAL LOW (ref 8.9–10.3)
Chloride: 111 mmol/L (ref 98–111)
Chloride: 108 mmol/L (ref 98–111)
Creatinine, Ser: 0.71 mg/dL (ref 0.44–1.00)
Creatinine, Ser: 0.57 mg/dL (ref 0.44–1.00)
GFR, Estimated: >60 mL/min (ref >60)
GFR, Estimated: >60 mL/min (ref >60)
Glucose, Bld: 162 mg/dL — ABNORMAL HIGH (ref 70–99)
Glucose, Bld: 208 mg/dL — ABNORMAL HIGH (ref 70–99)
Potassium: 3.6 mmol/L (ref 3.5–5.1)
Potassium: 4.1 mmol/L (ref 3.5–5.1)
Sodium: 138 mmol/L (ref 135–145)
Sodium: 136 mmol/L (ref 135–145)

## 2022-07-11 LAB — CBC
HCT: 23.3 % — ABNORMAL LOW (ref 36.0–46.0)
Hemoglobin: 7.4 g/dL — ABNORMAL LOW (ref 12.0–15.0)
MCH: 29.8 pg (ref 26.0–34.0)
MCHC: 31.8 g/dL (ref 30.0–36.0)
MCV: 94 fL (ref 80.0–100.0)
Platelets: 157 10*3/uL (ref 150–400)
RBC: 2.48 MIL/uL — ABNORMAL LOW (ref 3.87–5.11)
RDW: 16.6 % — ABNORMAL HIGH (ref 11.5–15.5)
WBC: 12.5 10*3/uL — ABNORMAL HIGH (ref 4.0–10.5)
nRBC: 0 % (ref 0.0–0.2)

## 2022-07-11 LAB — SODIUM: Sodium: 136 mmol/L (ref 135–145)

## 2022-07-11 LAB — MAGNESIUM
Magnesium: 2.4 mg/dL (ref 1.7–2.4)
Magnesium: 2.3 mg/dL (ref 1.7–2.4)

## 2022-07-11 LAB — GLUCOSE, CAPILLARY
Glucose-Capillary: 139 mg/dL — ABNORMAL HIGH (ref 70–99)
Glucose-Capillary: 142 mg/dL — ABNORMAL HIGH (ref 70–99)
Glucose-Capillary: 172 mg/dL — ABNORMAL HIGH (ref 70–99)
Glucose-Capillary: 173 mg/dL — ABNORMAL HIGH (ref 70–99)
Glucose-Capillary: 179 mg/dL — ABNORMAL HIGH (ref 70–99)
Glucose-Capillary: 219 mg/dL — ABNORMAL HIGH (ref 70–99)
Glucose-Capillary: 264 mg/dL — ABNORMAL HIGH (ref 70–99)

## 2022-07-11 LAB — PHOSPHORUS
Phosphorus: 1.4 mg/dL — ABNORMAL LOW (ref 2.5–4.6)
Phosphorus: 2.2 mg/dL — ABNORMAL LOW (ref 2.5–4.6)

## 2022-07-11 MED ORDER — SODIUM CHLORIDE 3 % IV SOLN
INTRAVENOUS | Status: DC
  Filled 2022-07-11 (×4): qty 500

## 2022-07-11 MED ORDER — POTASSIUM PHOSPHATES 15 MMOLE/5ML IV SOLN
30.0000 mmol | Freq: Once | INTRAVENOUS | Status: AC
  Administered 2022-07-11: 30 mmol via INTRAVENOUS
  Filled 2022-07-11: qty 10

## 2022-07-11 MED ORDER — POTASSIUM PHOSPHATES 15 MMOLE/5ML IV SOLN
15.0000 mmol | Freq: Once | INTRAVENOUS | Status: AC
  Administered 2022-07-11: 15 mmol via INTRAVENOUS
  Filled 2022-07-11: qty 5

## 2022-07-11 MED ORDER — AMLODIPINE BESYLATE 10 MG PO TABS
10.0000 mg | ORAL_TABLET | Freq: Every day | ORAL | Status: DC
  Administered 2022-07-11 – 2022-08-02 (×23): 10 mg
  Filled 2022-07-11 (×23): qty 1

## 2022-07-11 MED ORDER — POTASSIUM & SODIUM PHOSPHATES 280-160-250 MG PO PACK
1.0000 | PACK | Freq: Three times a day (TID) | ORAL | Status: AC
  Administered 2022-07-11 (×3): 1
  Filled 2022-07-11 (×2): qty 1

## 2022-07-11 NOTE — Progress Notes (Signed)
Pharmacy Electrolyte Replacement  Recent Labs:  Recent Labs    07/11/22 1636  K 4.1  MG 2.3  PHOS 2.2*  CREATININE 0.57    Low Critical Values (K </= 2.5, Phos </= 1, Mg </= 1) Present: None  MD Contacted: no critical value  Plan: Kphos 44mmol x1 Check level in AM  Onnie Boer, PharmD, [place], AAHIVP, CPP Infectious Disease Pharmacist 07/11/2022 6:43 PM

## 2022-07-11 NOTE — Progress Notes (Addendum)
eLink called for morning phos of 1.4. Asking for phos replacement.

## 2022-07-11 NOTE — Progress Notes (Signed)
   07/11/22 2217  Adult Ventilator Settings  Vent Mode PRVC  Vt Set 410 mL  Set Rate 16 bmp  FiO2 (%) 30 %  PEEP 5 cmH20  Adult Ventilator Measurements  Peak Airway Pressure 14 L/min  Mean Airway Pressure 7 cmH20  Resp Rate Spontaneous 4 br/min  Resp Rate Total 20 br/min  Exhaled Vt 597 mL  Measured Ve 9.8 mL  SpO2 100 %   Placed pt. Back on full mode because MV just kept decreasing

## 2022-07-11 NOTE — Progress Notes (Signed)
Providing Compassionate, Quality Care - Together   Subjective: Patient is intubated. Nurse reports sedation has been off since yesterday early morning. Daughters and husband at the bedside.  Objective: Vital signs in last 24 hours: Temp:  [97.8 F (36.6 C)-100.2 F (37.9 C)] 99.1 F (37.3 C) (01/12 0800) Pulse Rate:  [67-92] 77 (01/12 1300) Resp:  [12-24] 14 (01/12 1300) BP: (102-170)/(46-88) 129/58 (01/12 1300) SpO2:  [99 %-100 %] 100 % (01/12 1300) Arterial Line BP: (122-167)/(45-64) 141/51 (01/12 1300) FiO2 (%):  [30 %] 30 % (01/12 0800) Weight:  [58.9 kg] 58.9 kg (01/12 0500)  Intake/Output from previous day: 01/11 0701 - 01/12 0700 In: 2326.1 [I.V.:1528.6; NG/GT:497.5; IV Piggyback:300] Out: 1095 [Urine:1095] Intake/Output this shift: Total I/O In: 1103.8 [I.V.:258.1; NG/GT:238.7; IV Piggyback:607.1] Out: 325 [Urine:325]  Intubated PERRLA, Unable to open eyes independently. Will track and blink to confrontation when eyes are held open. Follows commands RUE and RLE Flickers to pain LUE, subtle purposeful movement LLE Incision closed with staples. Site is clean, dry, and intact   Lab Results: Recent Labs    07/10/22 0453 07/11/22 0452  WBC 13.2* 12.5*  HGB 7.7* 7.4*  HCT 23.6* 23.3*  PLT 169 157   BMET Recent Labs    07/10/22 0453 07/11/22 0452  NA 138 138  K 3.5 3.6  CL 109 111  CO2 23 22  GLUCOSE 128* 162*  BUN 13 14  CREATININE 0.69 0.71  CALCIUM 7.6* 7.5*    Studies/Results: CT HEAD WO CONTRAST (5MM)  Result Date: 07/11/2022 CLINICAL DATA:  Follow-up examination for subdural hematoma. EXAM: CT HEAD WITHOUT CONTRAST TECHNIQUE: Contiguous axial images were obtained from the base of the skull through the vertex without intravenous contrast. RADIATION DOSE REDUCTION: This exam was performed according to the departmental dose-optimization program which includes automated exposure control, adjustment of the mA and/or kV according to patient size  and/or use of iterative reconstruction technique. COMPARISON:  Prior CT from 07/09/2022. FINDINGS: Brain: Postoperative changes from right-sided craniotomy for subdural and intraparenchymal hematoma evacuation. Decreased pneumocephalus from previous. Residual extra-axial hemorrhage overlying the right frontotemporal convexity measures up to 8 mm in thickness, similar to prior. Residual intraparenchymal hemorrhage centered at the right frontal operculum is not significantly changed in size or morphology. Increased localized edema within this region from previous. Mass effect with approximately 5 mm of right-to-left shift, mildly increased from prior. No hydrocephalus or trapping. Basilar cisterns remain patent. Increased prominence of focal hypodensity involving the right basal ganglia at the genu of the internal capsule, suspicious for an evolving ischemic infarct (series 7, image 17). No other visible large vessel territory infarct. Small remote perforator infarct at the left basal ganglia noted. Abnormal vasogenic edema involving the anterior right frontal lobe again seen, again concerning for underlying tumor/mass. Vascular: No abnormal hyperdense vessel. Calcified atherosclerosis present at the skull base. Skull: Post craniotomy changes on the right.  No adverse features. Sinuses/Orbits: Globes orbital soft tissues demonstrate no acute finding. Visualized paranasal sinuses remain largely clear. No significant mastoid effusion. Other: None. IMPRESSION: 1. Postoperative changes from right-sided craniotomy for subdural and intraparenchymal hematoma evacuation. Residual extra-axial hemorrhage overlying the right frontotemporal convexity measures up to 8 mm in thickness, similar to prior. 2. Similar size of residual intraparenchymal hemorrhage centered at the right frontal operculum, but with mildly increased localized edema. Mass effect with approximately 5 mm of right-to-left shift, mildly increased from prior. No  hydrocephalus or trapping. 3. Increased prominence of small focal hypodensity involving the right basal  ganglia, suspicious for an evolving ischemic infarct. 4. Abnormal vasogenic edema involving the anterior right frontal lobe, again concerning for underlying tumor/mass. Electronically Signed   By: Jeannine Boga M.D.   On: 07/11/2022 00:04   DG Abd Portable 1V  Result Date: 07/10/2022 CLINICAL DATA:  Placement of enteric tube EXAM: PORTABLE ABDOMEN - 1 VIEW COMPARISON:  None Available. FINDINGS: Tip of enteric tube is seen in the antrum of the stomach. Bowel gas pattern is nonspecific. There is possible calcified fibroid in pelvis. Arterial calcifications are seen. IMPRESSION: Tip of enteric tube is seen in the antrum of the stomach. Electronically Signed   By: Elmer Picker M.D.   On: 07/10/2022 14:58   EEG adult  Result Date: 07/10/2022 Lora Havens, MD     07/10/2022  2:32 PM Patient Name: Gina Kelley MRN: [id number] Epilepsy Attending: Lora Havens Referring Physician/Provider: Gleason, Otilio Carpen, PA-C Date: 07/10/2022 Duration: 22.55 mins Patient history: 87 year old female with right frontoparietal ICH with SDH status post craniotomy.  EEG to evaluate for seizure. Level of alertness: comatose AEDs during EEG study: LEV, propofol Technical aspects: This EEG study was done with scalp electrodes positioned according to the 10-20 International system of electrode placement. Electrical activity was reviewed with band pass filter of 1-70Hz , sensitivity of 7 uV/mm, display speed of 68mm/sec with a 60Hz  notched filter applied as appropriate. EEG data were recorded continuously and digitally stored.  Video monitoring was available and reviewed as appropriate. Description: EEG showed continuous generalized and maximal right frontotemporal region 3-6 Hz theta- delta slowing.  Sharp transients were noted in right frontal region. Hyperventilation and photic stimulation were not performed.    ABNORMALITY -Continuous slow, generalized and maximal right frontotemporal region IMPRESSION: This study is suggestive of cortical dysfunction in right frontotemporal region likely secondary to underlying structural abnormality.  Additionally there is moderate to severe diffuse encephalopathy, nonspecific etiology.  No seizures or definite epileptiform discharges were seen throughout the recording. Priyanka Barbra Sarks    Assessment/Plan: Patient is three days status post craniotomy for evacuation of SDH and ICH by Dr. Annette Stable. Her neuro exam continues to improve. Pathology negative for malignant process. Specimen consistent with thrombus/hematoma. Continue supportive efforts. Appreciate CCM's participation in ventilator and medical management for this patient.  Family updated at the bedside.   LOS: 3 days     [name], DNP, AGNP-C Nurse Practitioner  [facility] [place] [address], [place], [place], [place] [address] P: [phone]    F: [phone]  07/11/2022, 1:32 PM

## 2022-07-11 NOTE — Progress Notes (Signed)
NAME:  NENA HAMPE, MRN:  [id number], DOB:  1936/01/08, LOS: 3 ADMISSION DATE:  07/08/2022 CONSULTATION DATE:  07/09/2022 REFERRING MD:  Annette Stable - NSGY CHIEF COMPLAINT:  Vent management s/p crani for IPH   History of Present Illness:  87 year old woman who presented to [facility] ED via EMS 1/9 as a Code Stroke. LKW 1/9 1300. Found by family with L-sided weakness, R gaze deviation. PMHx significant for HTN, HLD, CVA (1998 with residual LLE numbness), T2DM, osteopenia.  On ED arrival, patient had persistent L-sided weakness and R gaze deviation as well as confusion and aphasia; she was hypertensive to SBP 200s. CT Head demonstrated right IPH with 72mm midline shift and right frontoparietal SDH. Neuro consulted with plan for aggressive BP control, Cleviprex and Labetalol started. Patient was intubated in the ED for airway protection in the setting of worsening GCS. NSGY consulted. Concern for possible malignancy/intrinsic brain tumor given irregularity around hemorrhage on CT and vasogenic edema.  Patient was taken to OR 1/9 for R craniotomy/SDH evacuation and possible biopsy versus resection. Intraoperative course was unremarkable; biopsy of suspicious tissue sent to pathology. Postoperative repeat CT Head 1/10 demonstrated resolution of SDH post-crani and some residual hemorrhage without evidence of mass effect.   PCCM consulted for vent management.  Pertinent Medical History:   Past Medical History:  Diagnosis Date   Anemia of chronic disease 07/08/2006   Antral ulcer 02/23/2007   Seen on EGD in 2008, small ulcer with erosion    Cerebral vascular accident ([place]) 04/14/2006   1998, left lower extremity numbness, no residual deficits    Essential hypertension 04/14/2006   Gastroesophageal reflux disease 02/18/2013   Occasional, symptomatically relieved with peptobismol    Hyperlipidemia LDL goal < 100 04/14/2006   Hypertensive retinopathy of both eyes, grade 1 05/15/2016   Osteopenia of right femoral  neck 10/20/2016   DEXA (10/16/2016): R femur T -2.5 (FRAX tool calculates at -2.4), L1-L4 spine T -0.9, 10 year risk for: Major osteoporotic fracture 8.3%, Hip fracture 2.7%   Type 2 diabetes mellitus with stage 1 chronic kidney disease, without long-term current use of insulin ([place])    Type II diabetes mellitus ([place]) 04/14/2006   Significant Hospital Events: Including procedures, antibiotic start and stop dates in addition to other pertinent events   1/9 - Presented via EMS as Code Stroke. CT Head with R IPH/SDH, vasogenic edema c/f ?malignant process. Cleviprex for BP control. Intubated for airway protection. Taken to OR for R crani/SDH evacuation. 1/10 - Repeat CT Head with resolution of SDH, some residual hemorrhage, no mass effect. PCCM consulted for assistance with vent management. 1/12 off sedation and still remains encephalopathic  Interim History / Subjective:   Pt not waking up despite being off sedation since yesterday Repeat head CT with possible R basal ganglia evolving infarct EEG negative for sz  Objective:  Blood pressure 102/84, pulse 91, temperature 99.1 F (37.3 C), temperature source Axillary, resp. rate 14, height 5\' 3"  (1.6 m), weight 58.9 kg, SpO2 100 %.    Vent Mode: PRVC FiO2 (%):  [30 %] 30 % Set Rate:  [16 bmp] 16 bmp Vt Set:  [410 mL] 410 mL PEEP:  [5 cmH20] 5 cmH20 Plateau Pressure:  [10 cmH20-13 cmH20] 13 cmH20   Intake/Output Summary (Last 24 hours) at 07/11/2022 1322 Last data filed at 07/11/2022 1200 Gross per 24 hour  Intake 2818.64 ml  Output 1420 ml  Net 1398.64 ml    Filed Weights   07/09/22 0500 07/10/22  0500 07/11/22 0500  Weight: 55.5 kg 57.9 kg 58.9 kg     General:  critically ill elderly F intubated and mechanically ventilated HEENT: MM pink/moist, sclera anicteric Neuro: withdraws to painful stimuli and triggering vent, intermittently reaching for ETT CV: s1s2 rrr, no m/r/ PULM:  clear bilaterally on PSV, respirations even and  unlabored GI: soft, bsx4 active  Extremities: warm/dry, no edema  Skin: no rashes or lesions     Resolved Hospital Problem List:  Hypokalemia CKD stage 1  Assessment & Plan:     Right frontotemporal IPH R subdural hematoma, s/p evacuation Vasogenic edema with "brain compression" R basal ganglia infarct  Presented 1/9 as Code Stroke. CT Head with R IPH/SDH, vasogenic edema c/f malignancy. S/p OR 1/9 for R craniotomy/SDH evacuation and biopsy. Postoperative repeat CT Head 1/10 demonstrated resolution of SDH post-crani and some residual hemorrhage without evidence of mass effect.  -remains minimally responsive off sedation, repeat head CT with concern for R basal ganglia infarct which could be contributing, defer MRI to primary team NSGY - Neurology following - Goal SBP 130-150 - Continue Norvasc and Amlodipine - Received Decadron x 1 - continue Keppra  - F/u surgical pathology - Neuroprotective measures: HOB > 30 degrees, normoglycemia, normothermia, electrolytes WNL - PT/OT/SLP when able to participate in care  Acute hypoxemic respiratory failure requiring mechanical ventilation - Continue full vent support (4-8cc/kg IBW) - Wean FiO2 for O2 sat > 90% - Daily WUA/SBT as mental status tolerates; weaning sedation to attempt PSV today 1/11, mental status precludes extubation - VAP bundle - Pulmonary hygiene - PAD protocol for sedation: Propofol and Fentanyl for goal RASS 0 to -1 - Follow CXR, ABG  Hypertension Hyperlipidemia Prior CVA 1998 - Norvasc and cozaar resumed - Goal SBP 130-150 as above - Defer ASA resumption to NSGY, pending progress - Continue Lipitor, Zetia - Cardiac monitoring    T2DM - SSI - CBGs Q4H - Goal CBG 140-180  Best Practice: (right click and "Reselect all SmartList Selections" daily)   Diet/type: tubefeeds DVT prophylaxis: SCDs, no AC in the setting of IPH GI prophylaxis: PPI Lines: N/A Foley:  Yes, and it is still needed Code Status:   full code Last date of multidisciplinary goals of care discussion [Per Primary Team - NSGY]  Labs:  CBC: Recent Labs  Lab 07/08/22 1509 07/08/22 1512 07/08/22 1627 07/08/22 2027 07/09/22 0523 07/10/22 0453 07/11/22 0452  WBC 7.5  --   --   --  7.7 13.2* 12.5*  NEUTROABS 3.5  --   --   --   --   --   --   HGB 10.4*   < > 9.5* 8.5* 7.6* 7.7* 7.4*  HCT 31.3*   < > 28.0* 25.0* 24.1* 23.6* 23.3*  MCV 91.0  --   --   --  93.4 92.5 94.0  PLT 256  --   --   --  172 169 157   < > = values in this interval not displayed.    Basic Metabolic Panel: Recent Labs  Lab 07/08/22 1509 07/08/22 1512 07/08/22 1627 07/08/22 2027 07/09/22 0523 07/10/22 0453 07/10/22 1751 07/11/22 0452  NA 137 141 139 144 138 138  --  138  K 3.2* 3.3* 3.2* 2.8* 3.1* 3.5  --  3.6  CL 104 102  --   --  106 109  --  111  CO2 26  --   --   --  23 23  --  22  GLUCOSE  156* 158*  --   --  251* 128*  --  162*  BUN 19 23  --   --  19 13  --  14  CREATININE 0.71 0.60  --   --  0.78 0.69  --  0.71  CALCIUM 9.1  --   --   --  7.8* 7.6*  --  7.5*  MG  --   --   --   --   --  1.6* 2.8* 2.4  PHOS  --   --   --   --   --  2.2* 1.9* 1.4*    GFR: Estimated Creatinine Clearance: 43.3 mL/min (by C-G formula based on SCr of 0.71 mg/dL). Recent Labs  Lab 07/08/22 1509 07/09/22 0523 07/10/22 0453 07/11/22 0452  WBC 7.5 7.7 13.2* 12.5*    Liver Function Tests: Recent Labs  Lab 07/08/22 1509  AST 38  ALT 21  ALKPHOS 60  BILITOT 0.6  PROT 7.5  ALBUMIN 3.6    No results for input(s): "LIPASE", "AMYLASE" in the last 168 hours. No results for input(s): "AMMONIA" in the last 168 hours.  ABG:    Component Value Date/Time   PHART 7.409 07/08/2022 2027   PCO2ART 38.5 07/08/2022 2027   PO2ART 183 (H) 07/08/2022 2027   HCO3 24.3 07/08/2022 2027   TCO2 25 07/08/2022 2027   O2SAT 100 07/08/2022 2027    Coagulation Profile: Recent Labs  Lab 07/08/22 1509  INR 1.1    Cardiac Enzymes: No results for  input(s): "CKTOTAL", "CKMB", "CKMBINDEX", "TROPONINI" in the last 168 hours.  HbA1C: Hemoglobin A1C  Date/Time Value Ref Range Status  07/12/2021 10:37 AM 6.1 (A) 4.0 - 5.6 % Final  11/23/2020 09:25 AM 5.9 (A) 4.0 - 5.6 % Final   Hgb A1c MFr Bld  Date/Time Value Ref Range Status  04/03/2010 04:14 PM 6.0 %   07/13/2009 01:43 PM 6.1 %    CBG: Recent Labs  Lab 07/10/22 2008 07/11/22 0005 07/11/22 0353 07/11/22 0735 07/11/22 1123  GLUCAP 164* 142* 139* 173* 172*    Review of Systems:   Patient is encephalopathic and/or intubated. Therefore history has been obtained from chart review.   Past Medical History:  She,  has a past medical history of Anemia of chronic disease (07/08/2006), Antral ulcer (02/23/2007), Cerebral vascular accident ([facility]) (04/14/2006), Essential hypertension (04/14/2006), Gastroesophageal reflux disease (02/18/2013), Hyperlipidemia LDL goal < 100 (04/14/2006), Hypertensive retinopathy of both eyes, grade 1 (05/15/2016), Osteopenia of right femoral neck (10/20/2016), Type 2 diabetes mellitus with stage 1 chronic kidney disease, without long-term current use of insulin (H[facility]), and Type II diabetes mellitus (HCC) (04/14/2006).   Surgical History:   Past Surgical History:  Procedure Laterality Date   COLONOSCOPY     CRANIOTOMY Right 07/08/2022   Procedure: RIGHT CRANIOTOMY HEMATOMA EVACUATION SUBDURAL;  Surgeon: Julio Sicks, MD;  Location: [facility] OR;  Service: Neurosurgery;  Laterality: Right;   ESOPHAGOGASTRODUODENOSCOPY      Social History:   reports that she quit smoking about 44 years ago. She has never used smokeless tobacco. She reports that she does not drink alcohol and does not use drugs.   Family History:  Her family history includes Anuerysm (age of onset: 67) in her father; Asthma in her brother and sister; Breast cancer in her sister; Cirrhosis in her brother; Diabetes in her mother; Hypertension in her mother and son; Pulmonary disease in her brother; Stroke  in her mother and sister.   Allergies: Allergies  Allergen Reactions  Enalapril Cough    Home Medications: Prior to Admission medications   Medication Sig Start Date End Date Taking? Authorizing Provider  amLODipine (NORVASC) 5 MG tablet Take 1 tablet (5 mg total) by mouth daily. 05/14/22  Yes Inez Catalina, MD  aspirin 81 MG chewable tablet Chew 81 mg by mouth daily.   Yes [provider]  atorvastatin (LIPITOR) 80 MG tablet Take 1 tablet by mouth once daily Patient taking differently: Take 80 mg by mouth daily. 05/21/21  Yes Miguel Aschoff, MD  Cholecalciferol (VITAMIN D3) 75 MCG (3000 UT) TABS Take 1 tablet by mouth daily. Patient taking differently: Take 3,000 Units by mouth daily. 11/18/19  Yes Inez Catalina, MD  ezetimibe (ZETIA) 10 MG tablet Take 1 tablet (10 mg total) by mouth daily. 05/14/22  Yes Inez Catalina, MD  losartan (COZAAR) 25 MG tablet Take 1 tablet by mouth once daily 05/14/22  Yes Inez Catalina, MD  Multiple Vitamins-Minerals (PRESERVISION AREDS PO) Take 1 tablet by mouth in the morning and at bedtime.   Yes [provider]  alendronate (FOSAMAX) 70 MG tablet TAKE ONE TABLET BY MOUTH EVERY 7 DAYS. TAKE WITH A FULL GLASS OF WATER ON AN EMPTY STOMACH Patient not taking: Reported on 07/09/2022 12/21/21   Inez Catalina, MD    Critical care time: 40 minutes   The patient is critically ill with multiple organ system failure and requires high complexity decision making for assessment and support, frequent evaluation and titration of therapies, advanced monitoring, review of radiographic studies and interpretation of complex data.   Critical Care Time devoted to patient care services, exclusive of separately billable procedures, described in this note is 38 minutes.  Darcella Gasman Alaura Schippers, PA-C [facility] 07/11/22 1:22 PM  Please see [email or web address] for pager details.  From 7A-7P if no response, please call [phone] After  hours, please call ELink [phone]

## 2022-07-11 NOTE — Progress Notes (Signed)
Placed pt. Back on PS per RN and md order pt., pt. Was on full support tolerating prior settings. Placed pt. On PS of 14/5 to get the desired  10L MVE

## 2022-07-11 NOTE — Progress Notes (Signed)
[facility] Progress Note Patient Name: DEANIE JUPITER DOB: Apr 23, 1936 MRN: [id number]   Date of Service  07/11/2022  HPI/Events of Note  Hypophosphatemia  Hypokalemia - PO4--- = 1.4, K+ = 3.6 and Creatinine = 0.71.  eICU Interventions  Will replace K+ and PO4---.     Intervention Category Major Interventions: Electrolyte abnormality - evaluation and management  Yu Peggs [place] 07/11/2022, 6:04 AM

## 2022-07-11 NOTE — Progress Notes (Signed)
   07/11/22 2003  Vent Select  Invasive or Noninvasive Invasive  Adult Vent Y  Airway 7.5 mm  Placement Date/Time: 07/08/22 1532   Placed By: ED Physician  Airway Device: Endotracheal Tube  ETT Types: Oral  Size (mm): 7.5 mm  Cuffed: Cuffed  Airway Equipment: Video Laryngoscope;Stylet  Placement Confirmation: Direct Visualization;CXR Confirmed...  Secured at (cm) 24 cm  Measured From [place] By Commercial Tube Holder  Tube Holder Repositioned Yes  Prone position No  Cuff Pressure (cm H2O) Green OR 18-26 CmH2O  Adult Ventilator Settings  Vent Type Servo i  Humidity HME  Vent Mode PRVC  Vt Set 410 mL  Set Rate 16 bmp  FiO2 (%) 30 %  PEEP 5 cmH20  Adult Ventilator Measurements  Peak Airway Pressure 16 L/min  Mean Airway Pressure 9 cmH20  Plateau Pressure 11 cmH20  Resp Rate Spontaneous 2 br/min  Resp Rate Total 18 br/min  Exhaled Vt 655 mL  Measured Ve 8.6 mL  I:E Ratio Measured 1:2.2  Auto PEEP 0 cmH20  Total PEEP 5 cmH20  SpO2 99 %  Adult Ventilator Alarms  Alarms On Y  Ve High Alarm 21 L/min  Ve Low Alarm 3 L/min  Resp Rate High Alarm 38 br/min  Resp Rate Low Alarm 8  PEEP Low Alarm 3 cmH2O  Press High Alarm 40 cmH2O  T Apnea 20 sec(s)  VAP Prevention  HOB> 30 Degrees Y  Breath Sounds  Bilateral Breath Sounds Diminished;Clear  Airway Suctioning/Secretions  Suction Type ETT  Suction Device  Catheter  Secretion Amount Small  Secretion Color White;Yellow  Secretion Consistency Thin;Thick  Suction Tolerance Tolerated well  Suctioning Adverse Effects None   Placed pt. On full support mode to rest for the night

## 2022-07-11 NOTE — Progress Notes (Signed)
Nutrition Follow-up  DOCUMENTATION CODES:   Non-severe (moderate) malnutrition in context of chronic illness  INTERVENTION:   Tube feeding via OG tube: Osmolite 1.2 continue to increase by 10 ml every 8 hours to goal rate of 50 ml/hr (1200 ml per day) Prosource TF20 60 ml daily  Provides 1520 kcal, 86 gm protein, 973 ml free water daily  Monitor magnesium and phosphorus every 12 hours x 4 occurrences, MD to replete as needed, as pt is at risk for refeeding syndrome given moderate malnutrition.  Phosphorus level dropping, only 30 mmol Kphos ordered this am, spoke with Pharmacy who will recommend additional phos per tube   NUTRITION DIAGNOSIS:   Moderate Malnutrition related to chronic illness as evidenced by moderate fat depletion, moderate muscle depletion. Ongoing.   GOAL:   Patient will meet greater than or equal to 90% of their needs Progressing with TF advancement   MONITOR:   TF tolerance  REASON FOR ASSESSMENT:   Consult Enteral/tube feeding initiation and management  ASSESSMENT:   Pt with PMH of DM, HTN, HLD, CKD admitted with ICH with SDH s/p craniotomy.    Pt discussed during ICU rounds and with RN.  Noted phosphorus has decreased further with TF advancement, TF not yet at goal  Recommend cortrak placement if expected unable to swallow after extubation.   1/9 - s/p R crani and biopsy of suspicious tissue worrisome for metastatic disease per MD  Medications reviewed and include: 2-6 units novolog every 4 hours, protonix phos-NAK 1 packet TID today  NS @ 75 ml/hr Kphos 30 mmol x 1  Labs reviewed: Phosphorus: 2.2 -> 1.9 -> 1.4 CBG's: 139-173  16 F OG tube: per xray 1/11 tube in antrum of stomach after advancement     Diet Order:   Diet Order             Diet NPO time specified  Diet effective now                   EDUCATION NEEDS:   Not appropriate for education at this time  Skin:  Skin Assessment: Reviewed RN Assessment  Last BM:   unknown  Height:   Ht Readings from Last 1 Encounters:  07/08/22 5\' 3"  (1.6 m)    Weight:   Wt Readings from Last 1 Encounters:  07/11/22 58.9 kg    BMI:  Body mass index is 23 kg/m.  Estimated Nutritional Needs:   Kcal:  1450-1650  Protein:  75-90 grams  Fluid:  > 1.5 L/day  Lockie Pares., RD, LDN, CNSC See AMiON for contact information

## 2022-07-12 DIAGNOSIS — I639 Cerebral infarction, unspecified: Secondary | ICD-10-CM | POA: Diagnosis not present

## 2022-07-12 LAB — GLUCOSE, CAPILLARY
Glucose-Capillary: 275 mg/dL — ABNORMAL HIGH (ref 70–99)
Glucose-Capillary: 250 mg/dL — ABNORMAL HIGH (ref 70–99)
Glucose-Capillary: 222 mg/dL — ABNORMAL HIGH (ref 70–99)
Glucose-Capillary: 158 mg/dL — ABNORMAL HIGH (ref 70–99)
Glucose-Capillary: 125 mg/dL — ABNORMAL HIGH (ref 70–99)
Glucose-Capillary: 146 mg/dL — ABNORMAL HIGH (ref 70–99)
Glucose-Capillary: 123 mg/dL — ABNORMAL HIGH (ref 70–99)
Glucose-Capillary: 129 mg/dL — ABNORMAL HIGH (ref 70–99)
Glucose-Capillary: 125 mg/dL — ABNORMAL HIGH (ref 70–99)
Glucose-Capillary: 168 mg/dL — ABNORMAL HIGH (ref 70–99)
Glucose-Capillary: 213 mg/dL — ABNORMAL HIGH (ref 70–99)

## 2022-07-12 LAB — BASIC METABOLIC PANEL
Anion gap: 7 (ref 5–15)
BUN: 14 mg/dL (ref 8–23)
CO2: 23 mmol/L (ref 22–32)
Calcium: 7.6 mg/dL — ABNORMAL LOW (ref 8.9–10.3)
Chloride: 114 mmol/L — ABNORMAL HIGH (ref 98–111)
Creatinine, Ser: 0.55 mg/dL (ref 0.44–1.00)
GFR, Estimated: >60 mL/min (ref >60)
Glucose, Bld: 179 mg/dL — ABNORMAL HIGH (ref 70–99)
Potassium: 4.1 mmol/L (ref 3.5–5.1)
Sodium: 144 mmol/L (ref 135–145)

## 2022-07-12 LAB — CBC
HCT: 22.9 % — ABNORMAL LOW (ref 36.0–46.0)
Hemoglobin: 7.4 g/dL — ABNORMAL LOW (ref 12.0–15.0)
MCH: 29.7 pg (ref 26.0–34.0)
MCHC: 32.3 g/dL (ref 30.0–36.0)
MCV: 92 fL (ref 80.0–100.0)
Platelets: 157 10*3/uL (ref 150–400)
RBC: 2.49 MIL/uL — ABNORMAL LOW (ref 3.87–5.11)
RDW: 17 % — ABNORMAL HIGH (ref 11.5–15.5)
WBC: 10.1 10*3/uL (ref 4.0–10.5)
nRBC: 0 % (ref 0.0–0.2)

## 2022-07-12 LAB — SODIUM
Sodium: 139 mmol/L (ref 135–145)
Sodium: 144 mmol/L (ref 135–145)
Sodium: 144 mmol/L (ref 135–145)
Sodium: 145 mmol/L (ref 135–145)

## 2022-07-12 LAB — MAGNESIUM: Magnesium: 2.2 mg/dL (ref 1.7–2.4)

## 2022-07-12 LAB — PHOSPHORUS: Phosphorus: 1.7 mg/dL — ABNORMAL LOW (ref 2.5–4.6)

## 2022-07-12 MED ORDER — INSULIN REGULAR(HUMAN) IN NACL 100-0.9 UT/100ML-% IV SOLN
INTRAVENOUS | Status: DC
  Administered 2022-07-12: 7 [IU]/h via INTRAVENOUS
  Filled 2022-07-12: qty 100

## 2022-07-12 MED ORDER — DEXTROSE 50 % IV SOLN: 0.0000 mL | INTRAVENOUS | Status: DC | PRN

## 2022-07-12 MED ORDER — EZETIMIBE 10 MG PO TABS
10.0000 mg | ORAL_TABLET | Freq: Every day | ORAL | Status: DC
  Administered 2022-07-12 – 2022-08-02 (×22): 10 mg
  Filled 2022-07-12 (×22): qty 1

## 2022-07-12 MED ORDER — INSULIN ASPART 100 UNIT/ML IJ SOLN
0.0000 [IU] | INTRAMUSCULAR | Status: DC
  Administered 2022-07-12: 5 [IU] via SUBCUTANEOUS
  Administered 2022-07-12: 2 [IU] via SUBCUTANEOUS
  Administered 2022-07-12 – 2022-07-13 (×3): 3 [IU] via SUBCUTANEOUS
  Administered 2022-07-13: 2 [IU] via SUBCUTANEOUS
  Administered 2022-07-13: 3 [IU] via SUBCUTANEOUS
  Administered 2022-07-13: 5 [IU] via SUBCUTANEOUS
  Administered 2022-07-14: 3 [IU] via SUBCUTANEOUS
  Administered 2022-07-14: 2 [IU] via SUBCUTANEOUS
  Administered 2022-07-14: 3 [IU] via SUBCUTANEOUS
  Administered 2022-07-14: 2 [IU] via SUBCUTANEOUS
  Administered 2022-07-14: 3 [IU] via SUBCUTANEOUS
  Administered 2022-07-15 (×3): 2 [IU] via SUBCUTANEOUS
  Administered 2022-07-15: 3 [IU] via SUBCUTANEOUS
  Administered 2022-07-15: 2 [IU] via SUBCUTANEOUS
  Administered 2022-07-15: 3 [IU] via SUBCUTANEOUS
  Administered 2022-07-15: 2 [IU] via SUBCUTANEOUS
  Administered 2022-07-16 – 2022-07-17 (×5): 3 [IU] via SUBCUTANEOUS
  Administered 2022-07-17 (×2): 2 [IU] via SUBCUTANEOUS
  Administered 2022-07-17 – 2022-07-18 (×3): 3 [IU] via SUBCUTANEOUS
  Administered 2022-07-18: 2 [IU] via SUBCUTANEOUS
  Administered 2022-07-18 (×2): 3 [IU] via SUBCUTANEOUS
  Administered 2022-07-18: 2 [IU] via SUBCUTANEOUS
  Administered 2022-07-18: 3 [IU] via SUBCUTANEOUS
  Administered 2022-07-19 (×2): 2 [IU] via SUBCUTANEOUS
  Administered 2022-07-19: 3 [IU] via SUBCUTANEOUS
  Administered 2022-07-19: 2 [IU] via SUBCUTANEOUS
  Administered 2022-07-19: 3 [IU] via SUBCUTANEOUS
  Administered 2022-07-19: 2 [IU] via SUBCUTANEOUS
  Administered 2022-07-19 – 2022-07-20 (×3): 3 [IU] via SUBCUTANEOUS
  Administered 2022-07-20: 2 [IU] via SUBCUTANEOUS
  Administered 2022-07-20 – 2022-07-21 (×3): 3 [IU] via SUBCUTANEOUS
  Administered 2022-07-21 (×2): 2 [IU] via SUBCUTANEOUS
  Administered 2022-07-21: 5 [IU] via SUBCUTANEOUS
  Administered 2022-07-22 – 2022-07-23 (×3): 2 [IU] via SUBCUTANEOUS
  Administered 2022-07-23: 3 [IU] via SUBCUTANEOUS
  Administered 2022-07-23: 2 [IU] via SUBCUTANEOUS
  Administered 2022-07-23: 3 [IU] via SUBCUTANEOUS
  Administered 2022-07-24: 2 [IU] via SUBCUTANEOUS
  Administered 2022-07-24: 3 [IU] via SUBCUTANEOUS
  Administered 2022-07-24 (×2): 2 [IU] via SUBCUTANEOUS
  Administered 2022-07-24: 3 [IU] via SUBCUTANEOUS
  Administered 2022-07-24: 2 [IU] via SUBCUTANEOUS
  Administered 2022-07-25: 3 [IU] via SUBCUTANEOUS
  Administered 2022-07-25 – 2022-07-27 (×13): 2 [IU] via SUBCUTANEOUS
  Administered 2022-07-27: 3 [IU] via SUBCUTANEOUS
  Administered 2022-07-27 – 2022-07-29 (×8): 2 [IU] via SUBCUTANEOUS
  Administered 2022-07-29: 3 [IU] via SUBCUTANEOUS
  Administered 2022-07-29: 2 [IU] via SUBCUTANEOUS
  Administered 2022-07-30: 3 [IU] via SUBCUTANEOUS
  Administered 2022-07-30 (×3): 2 [IU] via SUBCUTANEOUS
  Administered 2022-07-30: 3 [IU] via SUBCUTANEOUS
  Administered 2022-07-30: 2 [IU] via SUBCUTANEOUS
  Administered 2022-07-31 (×2): 3 [IU] via SUBCUTANEOUS
  Administered 2022-07-31: 2 [IU] via SUBCUTANEOUS
  Administered 2022-08-01: 3 [IU] via SUBCUTANEOUS
  Administered 2022-08-02 (×2): 2 [IU] via SUBCUTANEOUS

## 2022-07-12 MED ORDER — GLUCERNA 1.5 CAL PO LIQD
1000.0000 mL | ORAL | Status: DC
  Administered 2022-07-12 – 2022-07-14 (×3): 1000 mL
  Filled 2022-07-12 (×5): qty 1000

## 2022-07-12 MED ORDER — INSULIN GLARGINE-YFGN 100 UNIT/ML ~~LOC~~ SOLN
10.0000 [IU] | SUBCUTANEOUS | Status: DC
  Administered 2022-07-12: 10 [IU] via SUBCUTANEOUS
  Filled 2022-07-12 (×2): qty 0.1

## 2022-07-12 MED ORDER — ATORVASTATIN CALCIUM 80 MG PO TABS
80.0000 mg | ORAL_TABLET | Freq: Every day | ORAL | Status: DC
  Administered 2022-07-13 – 2022-08-02 (×21): 80 mg
  Filled 2022-07-12 (×21): qty 1

## 2022-07-12 MED ORDER — SODIUM PHOSPHATES 45 MMOLE/15ML IV SOLN
45.0000 mmol | Freq: Once | INTRAVENOUS | Status: AC
  Administered 2022-07-12: 45 mmol via INTRAVENOUS
  Filled 2022-07-12: qty 15

## 2022-07-12 MED ORDER — SENNOSIDES-DOCUSATE SODIUM 8.6-50 MG PO TABS
1.0000 | ORAL_TABLET | Freq: Two times a day (BID) | ORAL | Status: DC
  Administered 2022-07-12 – 2022-08-01 (×33): 1
  Filled 2022-07-12 (×35): qty 1

## 2022-07-12 MED ORDER — POLYETHYLENE GLYCOL 3350 17 G PO PACK
17.0000 g | PACK | Freq: Every day | ORAL | Status: DC
  Administered 2022-07-12 – 2022-07-31 (×12): 17 g
  Filled 2022-07-12 (×14): qty 1

## 2022-07-12 MED ORDER — FREE WATER: 50.0000 mL | Freq: Four times a day (QID) | Status: DC

## 2022-07-12 MED ORDER — PROSOURCE TF20 ENFIT COMPATIBL EN LIQD
60.0000 mL | Freq: Every day | ENTERAL | Status: DC
  Administered 2022-07-12 – 2022-07-14 (×3): 60 mL
  Filled 2022-07-12 (×3): qty 60

## 2022-07-12 NOTE — Progress Notes (Signed)
   07/12/22 2000  Vent Select  Invasive or Noninvasive Invasive  Adult Vent Y  Airway 7.5 mm  Placement Date/Time: 07/08/22 1532   Placed By: ED Physician  Airway Device: Endotracheal Tube  ETT Types: Oral  Size (mm): 7.5 mm  Cuffed: Cuffed  Airway Equipment: Video Laryngoscope;Stylet  Placement Confirmation: Direct Visualization;CXR Confirmed...  Secured at (cm) 23 cm  Measured From [place] [employer] Tube Holder  Tube Holder Repositioned Yes  Prone position No  Cuff Pressure (cm H2O) Clear OR 27-39 CmH2O  Adult Ventilator Settings  Vent Type Servo i  Humidity HME  Vent Mode PRVC  Vt Set 410 mL  Set Rate 16 bmp  FiO2 (%) 30 %  PEEP 5 cmH20  Adult Ventilator Measurements  Peak Airway Pressure 13 L/min  Mean Airway Pressure 8 cmH20  Plateau Pressure 17 cmH20  Resp Rate Spontaneous 14 br/min  Resp Rate Total 30 br/min  Exhaled Vt 787 mL  Measured Ve 8.8 mL  I:E Ratio Measured 1:2.2  Auto PEEP 0 cmH20  Total PEEP 5 cmH20  SpO2 100 %  Adult Ventilator Alarms  Alarms On Y  Ve High Alarm 21 L/min  Ve Low Alarm 3 L/min  Resp Rate High Alarm 38 br/min  Resp Rate Low Alarm 8  PEEP Low Alarm 3 cmH2O  Press High Alarm 40 cmH2O  T Apnea 20 sec(s)  Breath Sounds  Bilateral Breath Sounds Diminished  Vent Respiratory Assessment  Respiratory Pattern Regular;Unlabored  Airway Suctioning/Secretions  Suction Type ETT  Suction Device  Catheter  Secretion Amount Small  Secretion Color White  Secretion Consistency Thin;Thick  Suction Tolerance Tolerated well  Suctioning Adverse Effects None   Switched pt. To full mode to rest for the night

## 2022-07-12 NOTE — Progress Notes (Signed)
Subjective: The patient is in no apparent distress.  She does open her eyes.  She follows commands.   Objective: Vital signs in last 24 hours: Temp:  [99.8 F (37.7 C)-100.3 F (37.9 C)] 99.8 F (37.7 C) (01/13 0800) Pulse Rate:  [73-106] 106 (01/13 1100) Resp:  [12-25] 19 (01/13 1100) BP: (129-155)/(53-123) 155/119 (01/13 1100) SpO2:  [99 %-100 %] 100 % (01/13 1100) Arterial Line BP: (117-178)/(51-86) 178/78 (01/13 1100) FiO2 (%):  [30 %] 30 % (01/13 0700) Estimated body mass index is 23 kg/m as calculated from the following:   Height as of this encounter: 5\' 3"  (1.6 m).   Weight as of this encounter: 58.9 kg.   Intake/Output from previous day: 01/12 0701 - 01/13 0700 In: 2650.5 [I.V.:807; NG/GT:1117.8; IV Piggyback:725.6] Out: 475 [Urine:475] Intake/Output this shift: Total I/O In: 537.8 [I.V.:207.8; NG/GT:230; IV Piggyback:100] Out: 400 [Urine:400]  Physical exam the patient follows commands. Lab Results: Recent Labs    07/11/22 0452 07/12/22 0616  WBC 12.5* 10.1  HGB 7.4* 7.4*  HCT 23.3* 22.9*  PLT 157 157   BMET Recent Labs    07/11/22 1636 07/11/22 2049 07/12/22 0616 07/12/22 1225  NA 136   < > 144  144 144  K 4.1  --  4.1  --   CL 108  --  114*  --   CO2 22  --  23  --   GLUCOSE 208*  --  179*  --   BUN 16  --  14  --   CREATININE 0.57  --  0.55  --   CALCIUM 7.5*  --  7.6*  --    < > = values in this interval not displayed.    Studies/Results: CT HEAD WO CONTRAST (5MM)  Result Date: 07/11/2022 CLINICAL DATA:  Follow-up examination for subdural hematoma. EXAM: CT HEAD WITHOUT CONTRAST TECHNIQUE: Contiguous axial images were obtained from the base of the skull through the vertex without intravenous contrast. RADIATION DOSE REDUCTION: This exam was performed according to the departmental dose-optimization program which includes automated exposure control, adjustment of the mA and/or kV according to patient size and/or use of iterative reconstruction  technique. COMPARISON:  Prior CT from 07/09/2022. FINDINGS: Brain: Postoperative changes from right-sided craniotomy for subdural and intraparenchymal hematoma evacuation. Decreased pneumocephalus from previous. Residual extra-axial hemorrhage overlying the right frontotemporal convexity measures up to 8 mm in thickness, similar to prior. Residual intraparenchymal hemorrhage centered at the right frontal operculum is not significantly changed in size or morphology. Increased localized edema within this region from previous. Mass effect with approximately 5 mm of right-to-left shift, mildly increased from prior. No hydrocephalus or trapping. Basilar cisterns remain patent. Increased prominence of focal hypodensity involving the right basal ganglia at the genu of the internal capsule, suspicious for an evolving ischemic infarct (series 7, image 17). No other visible large vessel territory infarct. Small remote perforator infarct at the left basal ganglia noted. Abnormal vasogenic edema involving the anterior right frontal lobe again seen, again concerning for underlying tumor/mass. Vascular: No abnormal hyperdense vessel. Calcified atherosclerosis present at the skull base. Skull: Post craniotomy changes on the right.  No adverse features. Sinuses/Orbits: Globes orbital soft tissues demonstrate no acute finding. Visualized paranasal sinuses remain largely clear. No significant mastoid effusion. Other: None. IMPRESSION: 1. Postoperative changes from right-sided craniotomy for subdural and intraparenchymal hematoma evacuation. Residual extra-axial hemorrhage overlying the right frontotemporal convexity measures up to 8 mm in thickness, similar to prior. 2. Similar size of residual intraparenchymal  hemorrhage centered at the right frontal operculum, but with mildly increased localized edema. Mass effect with approximately 5 mm of right-to-left shift, mildly increased from prior. No hydrocephalus or trapping. 3. Increased  prominence of small focal hypodensity involving the right basal ganglia, suspicious for an evolving ischemic infarct. 4. Abnormal vasogenic edema involving the anterior right frontal lobe, again concerning for underlying tumor/mass. Electronically Signed   By: Jeannine Boga M.D.   On: 07/11/2022 00:04   DG Abd Portable 1V  Result Date: 07/10/2022 CLINICAL DATA:  Placement of enteric tube EXAM: PORTABLE ABDOMEN - 1 VIEW COMPARISON:  None Available. FINDINGS: Tip of enteric tube is seen in the antrum of the stomach. Bowel gas pattern is nonspecific. There is possible calcified fibroid in pelvis. Arterial calcifications are seen. IMPRESSION: Tip of enteric tube is seen in the antrum of the stomach. Electronically Signed   By: Elmer Picker M.D.   On: 07/10/2022 14:58   EEG adult  Result Date: 07/10/2022 Lora Havens, MD     07/10/2022  2:32 PM Patient Name: Gina Kelley MRN: [id number] Epilepsy Attending: Lora Havens Referring Physician/Provider: Gleason, Otilio Carpen, PA-C Date: 07/10/2022 Duration: 22.55 mins Patient history: 87 year old female with right frontoparietal ICH with SDH status post craniotomy.  EEG to evaluate for seizure. Level of alertness: comatose AEDs during EEG study: LEV, propofol Technical aspects: This EEG study was done with scalp electrodes positioned according to the 10-20 International system of electrode placement. Electrical activity was reviewed with band pass filter of 1-70Hz , sensitivity of 7 uV/mm, display speed of 26mm/sec with a 60Hz  notched filter applied as appropriate. EEG data were recorded continuously and digitally stored.  Video monitoring was available and reviewed as appropriate. Description: EEG showed continuous generalized and maximal right frontotemporal region 3-6 Hz theta- delta slowing.  Sharp transients were noted in right frontal region. Hyperventilation and photic stimulation were not performed.   ABNORMALITY -Continuous slow,  generalized and maximal right frontotemporal region IMPRESSION: This study is suggestive of cortical dysfunction in right frontotemporal region likely secondary to underlying structural abnormality.  Additionally there is moderate to severe diffuse encephalopathy, nonspecific etiology.  No seizures or definite epileptiform discharges were seen throughout the recording. Priyanka Barbra Sarks    Assessment/Plan: Postop day #4: Continue supportive care.  LOS: 4 days     [name] 07/12/2022, 12:56 PM     Patient ID: Gina Kelley, female   DOB: 02-26-1936, 87 y.o.   MRN: [id number]

## 2022-07-12 NOTE — Progress Notes (Addendum)
NAME:  Gina Kelley, MRN:  [id number], DOB:  Nov 15, 1935, LOS: 4 ADMISSION DATE:  07/08/2022 CONSULTATION DATE:  07/09/2022 REFERRING MD:  Annette Stable - NSGY CHIEF COMPLAINT:  Vent management s/p crani for IPH   History of Present Illness:  87 year old woman who presented to [facility] ED via EMS 1/9 as a Code Stroke. LKW 1/9 1300. Found by family with L-sided weakness, R gaze deviation. PMHx significant for HTN, HLD, CVA (1998 with residual LLE numbness), T2DM, osteopenia.  On ED arrival, patient had persistent L-sided weakness and R gaze deviation as well as confusion and aphasia; she was hypertensive to SBP 200s. CT Head demonstrated right IPH with 70mm midline shift and right frontoparietal SDH. Neuro consulted with plan for aggressive BP control, Cleviprex and Labetalol started. Patient was intubated in the ED for airway protection in the setting of worsening GCS. NSGY consulted. Concern for possible malignancy/intrinsic brain tumor given irregularity around hemorrhage on CT and vasogenic edema.  Patient was taken to OR 1/9 for R craniotomy/SDH evacuation and possible biopsy versus resection. Intraoperative course was unremarkable; biopsy of suspicious tissue sent to pathology. Postoperative repeat CT Head 1/10 demonstrated resolution of SDH post-crani and some residual hemorrhage without evidence of mass effect.   PCCM consulted for vent management.  Pertinent Medical History:   Past Medical History:  Diagnosis Date   Anemia of chronic disease 07/08/2006   Antral ulcer 02/23/2007   Seen on EGD in 2008, small ulcer with erosion    Cerebral vascular accident ([place]) 04/14/2006   1998, left lower extremity numbness, no residual deficits    Essential hypertension 04/14/2006   Gastroesophageal reflux disease 02/18/2013   Occasional, symptomatically relieved with peptobismol    Hyperlipidemia LDL goal < 100 04/14/2006   Hypertensive retinopathy of both eyes, grade 1 05/15/2016   Osteopenia of right femoral  neck 10/20/2016   DEXA (10/16/2016): R femur T -2.5 (FRAX tool calculates at -2.4), L1-L4 spine T -0.9, 10 year risk for: Major osteoporotic fracture 8.3%, Hip fracture 2.7%   Type 2 diabetes mellitus with stage 1 chronic kidney disease, without long-term current use of insulin ([place])    Type II diabetes mellitus ([place]) 04/14/2006   Significant Hospital Events: Including procedures, antibiotic start and stop dates in addition to other pertinent events   1/9 - Presented via EMS as Code Stroke. CT Head with R IPH/SDH, vasogenic edema c/f ?malignant process. Cleviprex for BP control. Intubated for airway protection. Taken to OR for R crani/SDH evacuation. 1/10 - Repeat CT Head with resolution of SDH, some residual hemorrhage, no mass effect. PCCM consulted for assistance with vent management. 1/12 off sedation and still remains encephalopathic, CT head with Similar size of residual intraparenchymal hemorrhage centered at the right frontal operculum, but with mildly increased localized edema. Mass effect with approximately 5 mm of right-to-left shift, mildly increased from prior. No hydrocephalus or trapping. EEG negative. Increased prominence of small focal hypodensity involving the right basal ganglia, suspicious for an evolving ischemic infarct. 4. Abnormal vasogenic edema involving the anterior right frontal lobe, again concerning for underlying tumor/mass  Interim History / Subjective:  Started on hypertonic. This AM on PS 5/5. Follow commands. +Gag, not opening eyes. Off sedation.   Objective:  Blood pressure (!) 145/70, pulse 88, temperature 100 F (37.8 C), temperature source Oral, resp. rate 12, height 5\' 3"  (1.6 m), weight 58.9 kg, SpO2 100 %.    Vent Mode: PRVC FiO2 (%):  [30 %] 30 % Set Rate:  [16 bmp]  16 bmp Vt Set:  [410 mL] 410 mL PEEP:  [5 cmH20] 5 cmH20 Pressure Support:  [8 cmH20-14 cmH20] 14 cmH20 Plateau Pressure:  [11 cmH20] 11 cmH20   Intake/Output Summary (Last 24 hours)  at 07/12/2022 0757 Last data filed at 07/12/2022 0600 Gross per 24 hour  Intake 2545.06 ml  Output 475 ml  Net 2070.06 ml   Filed Weights   07/09/22 0500 07/10/22 0500 07/11/22 0500  Weight: 55.5 kg 57.9 kg 58.9 kg     General: Elderly female on vent  HEENT: ETT/OG in place  Neuro: Follows commands right hemibody, no movement noted to LUE, withdrawals LLE CV: RRR, HR 72, no mRG PULM:  Clear breath sounds, no use of accessory muscles  GI: soft, active bowel sounds, external foley in place  Extremities: warm/dry, no edema  Skin: no rashes or lesions   Resolved Hospital Problem List:  Hypokalemia CKD stage 1  Assessment & Plan:   Right frontotemporal IPH R subdural hematoma, s/p evacuation Vasogenic edema with "brain compression" R basal ganglia infarct  Presented 1/9 as Code Stroke. CT Head with R IPH/SDH, vasogenic edema c/f malignancy. S/p OR 1/9 for R craniotomy/SDH evacuation and biopsy. Postoperative repeat CT Head 1/10 demonstrated resolution of SDH post-crani and some residual hemorrhage without evidence of mass effect.  -remains minimally responsive off sedation, repeat head CT with concern for R basal ganglia infarct which could be contributing, defer MRI to primary team NSGY -Surgical Path: Benign fibrovascular stroma and neuropil  Plan - continue Keppra  - Neuroprotective measures: HOB > 30 degrees, normoglycemia, normothermia, electrolytes WNL - PT/OT/SLP when able to participate in care - Continue Hypertonic saline for NA goal 145-150.   Acute hypoxemic respiratory failure requiring mechanical ventilation - Continue full vent support (4-8cc/kg IBW). Currently off sedation. On PS 5/5. Mentation barrier to extubation.  - Wean FiO2 for O2 sat > 90% - VAP bundle - Pulmonary hygiene  Hypertension Hyperlipidemia Prior CVA 1998 - Norvasc and cozaar - Goal SBP < 160  - Defer ASA resumption to NSGY, pending progress - Restart home Lipitor, Zetia - Cardiac  monitoring  Hyperglycemia  T2DM - on Insulin gtt overnight. Change to SSI - Change feeds to Glucerna. Monitor may need to add long acting  - CBGs Q4H  Best Practice: (right click and "Reselect all SmartList Selections" daily)   Diet/type: tubefeeds DVT prophylaxis: SCDs, no AC in the setting of IPH GI prophylaxis: PPI Lines: N/A Foley:  External foley cath  Code Status:  full code Last date of multidisciplinary goals of care discussion [Per Primary Team - NSGY]  Labs:  CBC: Recent Labs  Lab 07/08/22 1509 07/08/22 1512 07/08/22 2027 07/09/22 0523 07/10/22 0453 07/11/22 0452 07/12/22 0616  WBC 7.5  --   --  7.7 13.2* 12.5* 10.1  NEUTROABS 3.5  --   --   --   --   --   --   HGB 10.4*   < > 8.5* 7.6* 7.7* 7.4* 7.4*  HCT 31.3*   < > 25.0* 24.1* 23.6* 23.3* 22.9*  MCV 91.0  --   --  93.4 92.5 94.0 92.0  PLT 256  --   --  172 169 157 157   < > = values in this interval not displayed.   Basic Metabolic Panel: Recent Labs  Lab 07/09/22 0523 07/10/22 0453 07/10/22 1751 07/11/22 0452 07/11/22 1636 07/11/22 2049 07/12/22 0128 07/12/22 0616  NA 138 138  --  138 136 136 139 144  144  K 3.1* 3.5  --  3.6 4.1  --   --  4.1  CL 106 109  --  111 108  --   --  114*  CO2 23 23  --  22 22  --   --  23  GLUCOSE 251* 128*  --  162* 208*  --   --  179*  BUN 19 13  --  14 16  --   --  14  CREATININE 0.78 0.69  --  0.71 0.57  --   --  0.55  CALCIUM 7.8* 7.6*  --  7.5* 7.5*  --   --  7.6*  MG  --  1.6* 2.8* 2.4 2.3  --   --  2.2  PHOS  --  2.2* 1.9* 1.4* 2.2*  --   --  1.7*   GFR: Estimated Creatinine Clearance: 43.3 mL/min (by C-G formula based on SCr of 0.55 mg/dL). Recent Labs  Lab 07/09/22 0523 07/10/22 0453 07/11/22 0452 07/12/22 0616  WBC 7.7 13.2* 12.5* 10.1   Liver Function Tests: Recent Labs  Lab 07/08/22 1509  AST 38  ALT 21  ALKPHOS 60  BILITOT 0.6  PROT 7.5  ALBUMIN 3.6   No results for input(s): "LIPASE", "AMYLASE" in the last 168 hours. No results  for input(s): "AMMONIA" in the last 168 hours.  ABG:    Component Value Date/Time   PHART 7.409 07/08/2022 2027   PCO2ART 38.5 07/08/2022 2027   PO2ART 183 (H) 07/08/2022 2027   HCO3 24.3 07/08/2022 2027   TCO2 25 07/08/2022 2027   O2SAT 100 07/08/2022 2027    Coagulation Profile: Recent Labs  Lab 07/08/22 1509  INR 1.1   Cardiac Enzymes: No results for input(s): "CKTOTAL", "CKMB", "CKMBINDEX", "TROPONINI" in the last 168 hours.  HbA1C: Hemoglobin A1C  Date/Time Value Ref Range Status  07/12/2021 10:37 AM 6.1 (A) 4.0 - 5.6 % Final  11/23/2020 09:25 AM 5.9 (A) 4.0 - 5.6 % Final   Hgb A1c MFr Bld  Date/Time Value Ref Range Status  04/03/2010 04:14 PM 6.0 %   07/13/2009 01:43 PM 6.1 %    CBG: Recent Labs  Lab 07/12/22 0347 07/12/22 0435 07/12/22 0537 07/12/22 0636 07/12/22 0732  GLUCAP 275* 250* 222* 158* 146*   Review of Systems:   Patient is encephalopathic and/or intubated. Therefore history has been obtained from chart review.   Allergies: Allergies  Allergen Reactions   Enalapril Cough    Home Medications: Prior to Admission medications   Medication Sig Start Date End Date Taking? Authorizing Provider  amLODipine (NORVASC) 5 MG tablet Take 1 tablet (5 mg total) by mouth daily. 05/14/22  Yes Sid Falcon, MD  aspirin 81 MG chewable tablet Chew 81 mg by mouth daily.   Yes [provider]  atorvastatin (LIPITOR) 80 MG tablet Take 1 tablet by mouth once daily Patient taking differently: Take 80 mg by mouth daily. 05/21/21  Yes Angelica Pou, MD  Cholecalciferol (VITAMIN D3) 75 MCG (3000 UT) TABS Take 1 tablet by mouth daily. Patient taking differently: Take 3,000 Units by mouth daily. 11/18/19  Yes Sid Falcon, MD  ezetimibe (ZETIA) 10 MG tablet Take 1 tablet (10 mg total) by mouth daily. 05/14/22  Yes Sid Falcon, MD  losartan (COZAAR) 25 MG tablet Take 1 tablet by mouth once daily 05/14/22  Yes Sid Falcon, MD  Multiple  Vitamins-Minerals (PRESERVISION AREDS PO) Take 1 tablet by mouth in the morning and  at bedtime.   Yes [provider]  alendronate (FOSAMAX) 70 MG tablet TAKE ONE TABLET BY MOUTH EVERY 7 DAYS. TAKE WITH A FULL GLASS OF WATER ON AN EMPTY STOMACH Patient not taking: Reported on 07/09/2022 12/21/21   Inez Catalina, MD    Critical care time: 35 minutes   CRITICAL CARE Performed by: Tobey Grim   Total critical care time: 35 minutes  Critical care time was exclusive of separately billable procedures and treating other patients.  Critical care was necessary to treat or prevent imminent or life-threatening deterioration.  Critical care was time spent personally by me on the following activities: development of treatment plan with patient and/or surrogate as well as nursing, discussions with consultants, evaluation of patient's response to treatment, examination of patient, obtaining history from patient or surrogate, ordering and performing treatments and interventions, ordering and review of laboratory studies, ordering and review of radiographic studies, pulse oximetry and re-evaluation of patient's condition.  Jovita Kussmaul, AGACNP-BC [facility]  PCCM Pgr: [phone]

## 2022-07-12 NOTE — Progress Notes (Signed)
Orthopedic Tech Progress Note Patient Details:  Gina Kelley February 04, 1936 [id number] Bilateral AFOs have been ordered from [facility]  Patient ID: Gina Kelley, female   DOB: 03-22-1936, 87 y.o.   MRN: [id number]  Gina Kelley 07/12/2022, 10:21 AM

## 2022-07-13 ENCOUNTER — Inpatient Hospital Stay (HOSPITAL_COMMUNITY): Payer: Medicare HMO

## 2022-07-13 DIAGNOSIS — I639 Cerebral infarction, unspecified: Secondary | ICD-10-CM | POA: Diagnosis not present

## 2022-07-13 LAB — HEPATIC FUNCTION PANEL
ALT: 27 U/L (ref 0–44)
AST: 44 U/L — ABNORMAL HIGH (ref 15–41)
Albumin: 2.1 g/dL — ABNORMAL LOW (ref 3.5–5.0)
Alkaline Phosphatase: 61 U/L (ref 38–126)
Bilirubin, Direct: <0.1 mg/dL (ref 0.0–0.2)
Total Bilirubin: 0.3 mg/dL (ref 0.3–1.2)
Total Protein: 5.5 g/dL — ABNORMAL LOW (ref 6.5–8.1)

## 2022-07-13 LAB — CBC
HCT: 21.6 % — ABNORMAL LOW (ref 36.0–46.0)
Hemoglobin: 7 g/dL — ABNORMAL LOW (ref 12.0–15.0)
MCH: 29.9 pg (ref 26.0–34.0)
MCHC: 32.4 g/dL (ref 30.0–36.0)
MCV: 92.3 fL (ref 80.0–100.0)
Platelets: 171 10*3/uL (ref 150–400)
RBC: 2.34 MIL/uL — ABNORMAL LOW (ref 3.87–5.11)
RDW: 17.2 % — ABNORMAL HIGH (ref 11.5–15.5)
WBC: 9.7 10*3/uL (ref 4.0–10.5)
nRBC: 0 % (ref 0.0–0.2)

## 2022-07-13 LAB — BASIC METABOLIC PANEL
Anion gap: 6 (ref 5–15)
BUN: 16 mg/dL (ref 8–23)
CO2: 25 mmol/L (ref 22–32)
Calcium: 7.9 mg/dL — ABNORMAL LOW (ref 8.9–10.3)
Chloride: 116 mmol/L — ABNORMAL HIGH (ref 98–111)
Creatinine, Ser: 0.54 mg/dL (ref 0.44–1.00)
GFR, Estimated: >60 mL/min (ref >60)
Glucose, Bld: 190 mg/dL — ABNORMAL HIGH (ref 70–99)
Potassium: 3.9 mmol/L (ref 3.5–5.1)
Sodium: 147 mmol/L — ABNORMAL HIGH (ref 135–145)

## 2022-07-13 LAB — TYPE AND SCREEN
ABO/RH(D): O POS
Antibody Screen: NEGATIVE

## 2022-07-13 LAB — GLUCOSE, CAPILLARY
Glucose-Capillary: 119 mg/dL — ABNORMAL HIGH (ref 70–99)
Glucose-Capillary: 128 mg/dL — ABNORMAL HIGH (ref 70–99)
Glucose-Capillary: 151 mg/dL — ABNORMAL HIGH (ref 70–99)
Glucose-Capillary: 170 mg/dL — ABNORMAL HIGH (ref 70–99)
Glucose-Capillary: 175 mg/dL — ABNORMAL HIGH (ref 70–99)
Glucose-Capillary: 186 mg/dL — ABNORMAL HIGH (ref 70–99)
Glucose-Capillary: 204 mg/dL — ABNORMAL HIGH (ref 70–99)

## 2022-07-13 LAB — MAGNESIUM: Magnesium: 2.1 mg/dL (ref 1.7–2.4)

## 2022-07-13 LAB — URIC ACID: Uric Acid, Serum: <1.5 mg/dL — ABNORMAL LOW (ref 2.5–7.1)

## 2022-07-13 LAB — SODIUM: Sodium: 148 mmol/L — ABNORMAL HIGH (ref 135–145)

## 2022-07-13 LAB — PHOSPHORUS: Phosphorus: 2 mg/dL — ABNORMAL LOW (ref 2.5–4.6)

## 2022-07-13 MED ORDER — HYDRALAZINE HCL 20 MG/ML IJ SOLN: 10.0000 mg | Freq: Four times a day (QID) | INTRAMUSCULAR | Status: DC | PRN

## 2022-07-13 MED ORDER — LOSARTAN POTASSIUM 50 MG PO TABS
50.0000 mg | ORAL_TABLET | Freq: Every day | ORAL | Status: DC
  Administered 2022-07-13 – 2022-08-02 (×21): 50 mg
  Filled 2022-07-13 (×21): qty 1

## 2022-07-13 MED ORDER — INSULIN GLARGINE-YFGN 100 UNIT/ML ~~LOC~~ SOLN
15.0000 [IU] | Freq: Every day | SUBCUTANEOUS | Status: DC
  Administered 2022-07-13 – 2022-07-21 (×9): 15 [IU] via SUBCUTANEOUS
  Filled 2022-07-13 (×10): qty 0.15

## 2022-07-13 MED ORDER — SODIUM PHOSPHATES 45 MMOLE/15ML IV SOLN
45.0000 mmol | Freq: Once | INTRAVENOUS | Status: DC
  Filled 2022-07-13: qty 15

## 2022-07-13 MED ORDER — OXYCODONE HCL 5 MG PO TABS
2.5000 mg | ORAL_TABLET | ORAL | Status: DC | PRN
  Administered 2022-07-13: 2.5 mg
  Filled 2022-07-13: qty 1

## 2022-07-13 MED ORDER — LABETALOL HCL 5 MG/ML IV SOLN: 10.0000 mg | INTRAVENOUS | Status: DC | PRN

## 2022-07-13 NOTE — Progress Notes (Addendum)
Subjective: The patient is intubated and in no apparent distress.  She is easily arousable.  Objective: Vital signs in last 24 hours: Temp:  [98.8 F (37.1 C)-99.8 F (37.7 C)] 98.8 F (37.1 C) (01/14 0353) Pulse Rate:  [67-106] 74 (01/14 0600) Resp:  [12-19] 16 (01/14 0600) BP: (125-162)/(52-119) 139/60 (01/14 0600) SpO2:  [98 %-100 %] 100 % (01/14 0600) Arterial Line BP: (99-178)/(49-78) 152/54 (01/14 0600) FiO2 (%):  [30 %] 30 % (01/14 0323) Weight:  [59.1 kg] 59.1 kg (01/14 0500) Estimated body mass index is 23.08 kg/m as calculated from the following:   Height as of this encounter: 5\' 3"  (1.6 m).   Weight as of this encounter: 59.1 kg.   Intake/Output from previous day: 01/13 0701 - 01/14 0700 In: 2855.2 [I.V.:1152.8; NG/GT:1240; IV Piggyback:462.4] Out: 3050 [Urine:3050] Intake/Output this shift: No intake/output data recorded.  Physical exam the patient is intubated and easily arousable.  She follows commands.  Pupils are equal.  Lab Results: Recent Labs    07/12/22 0616 07/13/22 0618  WBC 10.1 9.7  HGB 7.4* 7.0*  HCT 22.9* 21.6*  PLT 157 171   BMET Recent Labs    07/12/22 0616 07/12/22 1225 07/13/22 0004 07/13/22 0618  NA 144  144   < > 148* 147*  K 4.1  --   --  3.9  CL 114*  --   --  116*  CO2 23  --   --  25  GLUCOSE 179*  --   --  190*  BUN 14  --   --  16  CREATININE 0.55  --   --  0.54  CALCIUM 7.6*  --   --  7.9*   < > = values in this interval not displayed.    Studies/Results: No results found.  Assessment/Plan: Postop day #5: The patient is neurologically stable.  I will discontinue her 3% sodium. Respiratory failure: CCM is managing this.  LOS: 5 days     [name] 07/13/2022, 8:17 AM     Patient ID: Gina Kelley, female   DOB: [birth year], 87 y.o.   MRN: [id number]

## 2022-07-13 NOTE — Progress Notes (Signed)
NAME:  Gina Kelley, MRN:  [id number], DOB:  April 14, 1936, LOS: 5 ADMISSION DATE:  07/08/2022 CONSULTATION DATE:  07/09/2022 REFERRING MD:  Annette Stable - NSGY CHIEF COMPLAINT:  Vent management s/p crani for IPH   History of Present Illness:  87 year old woman who presented to [facility] ED via EMS 1/9 as a Code Stroke. LKW 1/9 1300. Found by family with L-sided weakness, R gaze deviation. PMHx significant for HTN, HLD, CVA (1998 with residual LLE numbness), T2DM, osteopenia.  On ED arrival, patient had persistent L-sided weakness and R gaze deviation as well as confusion and aphasia; she was hypertensive to SBP 200s. CT Head demonstrated right IPH with 46mm midline shift and right frontoparietal SDH. Neuro consulted with plan for aggressive BP control, Cleviprex and Labetalol started. Patient was intubated in the ED for airway protection in the setting of worsening GCS. NSGY consulted. Concern for possible malignancy/intrinsic brain tumor given irregularity around hemorrhage on CT and vasogenic edema.  Patient was taken to OR 1/9 for R craniotomy/SDH evacuation and possible biopsy versus resection. Intraoperative course was unremarkable; biopsy of suspicious tissue sent to pathology. Postoperative repeat CT Head 1/10 demonstrated resolution of SDH post-crani and some residual hemorrhage without evidence of mass effect.   PCCM consulted for vent management.  Pertinent Medical History:   Past Medical History:  Diagnosis Date   Anemia of chronic disease 07/08/2006   Antral ulcer 02/23/2007   Seen on EGD in 2008, small ulcer with erosion    Cerebral vascular accident ([place]) 04/14/2006   1998, left lower extremity numbness, no residual deficits    Essential hypertension 04/14/2006   Gastroesophageal reflux disease 02/18/2013   Occasional, symptomatically relieved with peptobismol    Hyperlipidemia LDL goal < 100 04/14/2006   Hypertensive retinopathy of both eyes, grade 1 05/15/2016   Osteopenia of right femoral  neck 10/20/2016   DEXA (10/16/2016): R femur T -2.5 (FRAX tool calculates at -2.4), L1-L4 spine T -0.9, 10 year risk for: Major osteoporotic fracture 8.3%, Hip fracture 2.7%   Type 2 diabetes mellitus with stage 1 chronic kidney disease, without long-term current use of insulin ([place])    Type II diabetes mellitus ([place]) 04/14/2006   Significant Hospital Events: Including procedures, antibiotic start and stop dates in addition to other pertinent events   1/9 - Presented via EMS as Code Stroke. CT Head with R IPH/SDH, vasogenic edema c/f ?malignant process. Cleviprex for BP control. Intubated for airway protection. Taken to OR for R crani/SDH evacuation. 1/10 - Repeat CT Head with resolution of SDH, some residual hemorrhage, no mass effect. PCCM consulted for assistance with vent management. 1/12 off sedation and still remains encephalopathic, CT head with Similar size of residual intraparenchymal hemorrhage centered at the right frontal operculum, but with mildly increased localized edema. Mass effect with approximately 5 mm of right-to-left shift, mildly increased from prior. No hydrocephalus or trapping. EEG negative. Increased prominence of small focal hypodensity involving the right basal ganglia, suspicious for an evolving ischemic infarct. 4. Abnormal vasogenic edema involving the anterior right frontal lobe, again concerning for underlying tumor/mass 1/13: started on hypertonic. Tolerating PS for 10 hours  Interim History / Subjective:  Hypertonic Stopped per NSY. On PS 5/5. Given 2.5 mg oxycodone for pain and then went apneic, placed back on rate    Objective:  Blood pressure 139/60, pulse 74, temperature 98.8 F (37.1 C), temperature source Oral, resp. rate 16, height 5\' 3"  (1.6 m), weight 59.1 kg, SpO2 100 %.    Vent  Mode: PRVC FiO2 (%):  [30 %] 30 % Set Rate:  [16 bmp] 16 bmp Vt Set:  [410 mL] 410 mL PEEP:  [5 cmH20] 5 cmH20 Pressure Support:  [5 cmH20] 5 cmH20 Plateau Pressure:   [17 cmH20] 17 cmH20   Intake/Output Summary (Last 24 hours) at 07/13/2022 0757 Last data filed at 07/13/2022 0600 Gross per 24 hour  Intake 2755.2 ml  Output 3050 ml  Net -294.8 ml   Filed Weights   07/10/22 0500 07/11/22 0500 07/13/22 0500  Weight: 57.9 kg 58.9 kg 59.1 kg    General: Elderly female on vent  HEENT: ETT/OG in place  Neuro: Does not open eyes, Follows commands right hemibody, no movement noted to LUE, withdrawals LLE CV: RRR, HR 65, no mRG PULM:  Clear breath sounds, no use of accessory muscles  GI: soft, active bowel sounds, external foley in place  Extremities: warm/dry, no edema  Skin: no rashes or lesions   Resolved Hospital Problem List:  Hypokalemia CKD stage 1  Assessment & Plan:   Right frontotemporal IPH R subdural hematoma, s/p evacuation Vasogenic edema with "brain compression" R basal ganglia infarct  Presented 1/9 as Code Stroke. CT Head with R IPH/SDH, vasogenic edema c/f malignancy. S/p OR 1/9 for R craniotomy/SDH evacuation and biopsy. Postoperative repeat CT Head 1/10 demonstrated resolution of SDH post-crani and some residual hemorrhage without evidence of mass effect.  -remains minimally responsive off sedation, repeat head CT with concern for R basal ganglia infarct which could be contributing, defer MRI to primary team NSGY -Surgical Path: Benign fibrovascular stroma and neuropil  Plan - continue Keppra  - Neuroprotective measures: HOB > 30 degrees, normoglycemia, normothermia, electrolytes WNL - PT/OT/SLP when able to participate in care - Continue Hypertonic saline for NA goal 145-150. >> stopped this AM per NSY   Acute hypoxemic respiratory failure requiring mechanical ventilation - Continue full vent support (4-8cc/kg IBW). Currently off sedation. Back on rate. Mentation barrier to extubation.  - Wean FiO2 for O2 sat > 90% - VAP bundle - Pulmonary hygiene  Hypertension Hyperlipidemia Prior CVA 1998 - Norvasc and cozaar >  increase Cozzaar to 50 mg  - Goal SBP < 160  - Defer ASA resumption to NSGY, pending progress - Continue home Lipitor, Zetia - Cardiac monitoring  Hyperglycemia  T2DM - Increase Semglee to 15 units daily  - TF via Glucerna. - CBGs Q4H  Best Practice: (right click and "Reselect all SmartList Selections" daily)   Diet/type: tubefeeds DVT prophylaxis: SCDs, no AC in the setting of IPH GI prophylaxis: PPI Lines: D/C Art Line  Foley:  External foley cath  Code Status:  full code Last date of multidisciplinary goals of care discussion [Per Primary Team - NSGY]  Labs:  CBC: Recent Labs  Lab 07/08/22 1509 07/08/22 1512 07/09/22 0523 07/10/22 0453 07/11/22 0452 07/12/22 0616 07/13/22 0618  WBC 7.5  --  7.7 13.2* 12.5* 10.1 9.7  NEUTROABS 3.5  --   --   --   --   --   --   HGB 10.4*   < > 7.6* 7.7* 7.4* 7.4* 7.0*  HCT 31.3*   < > 24.1* 23.6* 23.3* 22.9* 21.6*  MCV 91.0  --  93.4 92.5 94.0 92.0 92.3  PLT 256  --  172 169 157 157 171   < > = values in this interval not displayed.   Basic Metabolic Panel: Recent Labs  Lab 07/10/22 0453 07/10/22 1751 07/11/22 0452 07/11/22 1636 07/11/22 2049 07/12/22  [id number] 07/12/22 1225 07/12/22 1810 07/13/22 0004 07/13/22 0618  NA 138  --  138 136   < > 144  144 144 145 148* 147*  K 3.5  --  3.6 4.1  --  4.1  --   --   --  3.9  CL 109  --  111 108  --  114*  --   --   --  116*  CO2 23  --  22 22  --  23  --   --   --  25  GLUCOSE 128*  --  162* 208*  --  179*  --   --   --  190*  BUN 13  --  14 16  --  14  --   --   --  16  CREATININE 0.69  --  0.71 0.57  --  0.55  --   --   --  0.54  CALCIUM 7.6*  --  7.5* 7.5*  --  7.6*  --   --   --  7.9*  MG 1.6* 2.8* 2.4 2.3  --  2.2  --   --   --  2.1  PHOS 2.2* 1.9* 1.4* 2.2*  --  1.7*  --   --   --  2.0*   < > = values in this interval not displayed.   GFR: Estimated Creatinine Clearance: 43.3 mL/min (by C-G formula based on SCr of 0.54 mg/dL). Recent Labs  Lab 07/10/22 0453  07/11/22 0452 07/12/22 0616 07/13/22 0618  WBC 13.2* 12.5* 10.1 9.7   Liver Function Tests: Recent Labs  Lab 07/08/22 1509  AST 38  ALT 21  ALKPHOS 60  BILITOT 0.6  PROT 7.5  ALBUMIN 3.6   No results for input(s): "LIPASE", "AMYLASE" in the last 168 hours. No results for input(s): "AMMONIA" in the last 168 hours.  ABG:    Component Value Date/Time   PHART 7.409 07/08/2022 2027   PCO2ART 38.5 07/08/2022 2027   PO2ART 183 (H) 07/08/2022 2027   HCO3 24.3 07/08/2022 2027   TCO2 25 07/08/2022 2027   O2SAT 100 07/08/2022 2027    Coagulation Profile: Recent Labs  Lab 07/08/22 1509  INR 1.1   Cardiac Enzymes: No results for input(s): "CKTOTAL", "CKMB", "CKMBINDEX", "TROPONINI" in the last 168 hours.  HbA1C: Hemoglobin A1C  Date/Time Value Ref Range Status  07/12/2021 10:37 AM 6.1 (A) 4.0 - 5.6 % Final  11/23/2020 09:25 AM 5.9 (A) 4.0 - 5.6 % Final   Hgb A1c MFr Bld  Date/Time Value Ref Range Status  04/03/2010 04:14 PM 6.0 %   07/13/2009 01:43 PM 6.1 %    CBG: Recent Labs  Lab 07/12/22 1200 07/12/22 1523 07/12/22 1952 07/13/22 0002 07/13/22 0407  GLUCAP 125* 168* 213* 119* 128*   Review of Systems:   Patient is encephalopathic and/or intubated. Therefore history has been obtained from chart review.   Allergies: Allergies  Allergen Reactions   Enalapril Cough    Home Medications: Prior to Admission medications   Medication Sig Start Date End Date Taking? Authorizing Provider  amLODipine (NORVASC) 5 MG tablet Take 1 tablet (5 mg total) by mouth daily. 05/14/22  Yes Inez CatalinaMullen, Emily B, MD  aspirin 81 MG chewable tablet Chew 81 mg by mouth daily.   Yes [provider]  atorvastatin (LIPITOR) 80 MG tablet Take 1 tablet by mouth once daily Patient taking differently: Take 80 mg by mouth daily. 05/21/21  Yes Miguel AschoffWilliams, Julie Anne, MD  Cholecalciferol (VITAMIN D3) 75 MCG (3000 UT) TABS Take 1 tablet by mouth daily. Patient taking differently: Take  3,000 Units by mouth daily. 11/18/19  Yes Inez Catalina, MD  ezetimibe (ZETIA) 10 MG tablet Take 1 tablet (10 mg total) by mouth daily. 05/14/22  Yes Inez Catalina, MD  losartan (COZAAR) 25 MG tablet Take 1 tablet by mouth once daily 05/14/22  Yes Inez Catalina, MD  Multiple Vitamins-Minerals (PRESERVISION AREDS PO) Take 1 tablet by mouth in the morning and at bedtime.   Yes [provider]  alendronate (FOSAMAX) 70 MG tablet TAKE ONE TABLET BY MOUTH EVERY 7 DAYS. TAKE WITH A FULL GLASS OF WATER ON AN EMPTY STOMACH Patient not taking: Reported on 07/09/2022 12/21/21   Inez Catalina, MD    Critical care time: 32 minutes   CRITICAL CARE Performed by: Tobey Grim   Total critical care time: 32 minutes  Critical care time was exclusive of separately billable procedures and treating other patients.  Critical care was necessary to treat or prevent imminent or life-threatening deterioration.  Critical care was time spent personally by me on the following activities: development of treatment plan with patient and/or surrogate as well as nursing, discussions with consultants, evaluation of patient's response to treatment, examination of patient, obtaining history from patient or surrogate, ordering and performing treatments and interventions, ordering and review of laboratory studies, ordering and review of radiographic studies, pulse oximetry and re-evaluation of patient's condition.  Jovita Kussmaul, AGACNP-BC  Pulmonary & Critical Care  PCCM Pgr: [phone]

## 2022-07-14 ENCOUNTER — Inpatient Hospital Stay (HOSPITAL_COMMUNITY): Payer: Medicare HMO

## 2022-07-14 DIAGNOSIS — J9601 Acute respiratory failure with hypoxia: Secondary | ICD-10-CM | POA: Diagnosis not present

## 2022-07-14 DIAGNOSIS — J9690 Respiratory failure, unspecified, unspecified whether with hypoxia or hypercapnia: Secondary | ICD-10-CM

## 2022-07-14 DIAGNOSIS — I10 Essential (primary) hypertension: Secondary | ICD-10-CM

## 2022-07-14 DIAGNOSIS — Z9889 Other specified postprocedural states: Secondary | ICD-10-CM | POA: Diagnosis not present

## 2022-07-14 LAB — CBC
HCT: 25.8 % — ABNORMAL LOW (ref 36.0–46.0)
Hemoglobin: 7.9 g/dL — ABNORMAL LOW (ref 12.0–15.0)
MCH: 29 pg (ref 26.0–34.0)
MCHC: 30.6 g/dL (ref 30.0–36.0)
MCV: 94.9 fL (ref 80.0–100.0)
Platelets: 207 10*3/uL (ref 150–400)
RBC: 2.72 MIL/uL — ABNORMAL LOW (ref 3.87–5.11)
RDW: 17 % — ABNORMAL HIGH (ref 11.5–15.5)
WBC: 10.4 10*3/uL (ref 4.0–10.5)
nRBC: 0 % (ref 0.0–0.2)

## 2022-07-14 LAB — BASIC METABOLIC PANEL
Anion gap: 7 (ref 5–15)
BUN: 20 mg/dL (ref 8–23)
CO2: 29 mmol/L (ref 22–32)
Calcium: 8.7 mg/dL — ABNORMAL LOW (ref 8.9–10.3)
Chloride: 109 mmol/L (ref 98–111)
Creatinine, Ser: 0.6 mg/dL (ref 0.44–1.00)
GFR, Estimated: >60 mL/min (ref >60)
Glucose, Bld: 152 mg/dL — ABNORMAL HIGH (ref 70–99)
Potassium: 4.1 mmol/L (ref 3.5–5.1)
Sodium: 145 mmol/L (ref 135–145)

## 2022-07-14 LAB — POCT I-STAT 7, (LYTES, BLD GAS, ICA,H+H)
Acid-Base Excess: 5 mmol/L — ABNORMAL HIGH (ref 0.0–2.0)
Bicarbonate: 30.8 mmol/L — ABNORMAL HIGH (ref 20.0–28.0)
Calcium, Ion: 1.29 mmol/L (ref 1.15–1.40)
HCT: 24 % — ABNORMAL LOW (ref 36.0–46.0)
Hemoglobin: 8.2 g/dL — ABNORMAL LOW (ref 12.0–15.0)
O2 Saturation: 96 %
Patient temperature: 99.8
Potassium: 3.9 mmol/L (ref 3.5–5.1)
Sodium: 147 mmol/L — ABNORMAL HIGH (ref 135–145)
TCO2: 32 mmol/L (ref 22–32)
pCO2 arterial: 55.3 mmHg — ABNORMAL HIGH (ref 32–48)
pH, Arterial: 7.356 (ref 7.35–7.45)
pO2, Arterial: 90 mmHg (ref 83–108)

## 2022-07-14 LAB — MAGNESIUM: Magnesium: 2.2 mg/dL (ref 1.7–2.4)

## 2022-07-14 LAB — PHOSPHORUS: Phosphorus: 2.1 mg/dL — ABNORMAL LOW (ref 2.5–4.6)

## 2022-07-14 LAB — GLUCOSE, CAPILLARY
Glucose-Capillary: 140 mg/dL — ABNORMAL HIGH (ref 70–99)
Glucose-Capillary: 163 mg/dL — ABNORMAL HIGH (ref 70–99)
Glucose-Capillary: 123 mg/dL — ABNORMAL HIGH (ref 70–99)
Glucose-Capillary: 88 mg/dL (ref 70–99)
Glucose-Capillary: 160 mg/dL — ABNORMAL HIGH (ref 70–99)

## 2022-07-14 MED ORDER — ROCURONIUM BROMIDE 10 MG/ML (PF) SYRINGE
PREFILLED_SYRINGE | INTRAVENOUS | Status: AC
  Administered 2022-07-14: 50 mg
  Filled 2022-07-14: qty 10

## 2022-07-14 MED ORDER — GLUCERNA 1.5 CAL PO LIQD
1000.0000 mL | ORAL | Status: DC
  Administered 2022-07-14 – 2022-08-02 (×9): 1000 mL
  Filled 2022-07-14 (×23): qty 1000

## 2022-07-14 MED ORDER — FENTANYL CITRATE PF 50 MCG/ML IJ SOSY: 100.0000 ug | PREFILLED_SYRINGE | Freq: Once | INTRAMUSCULAR | Status: AC

## 2022-07-14 MED ORDER — ROCURONIUM BROMIDE 50 MG/5ML IV SOLN: 50.0000 mg | Freq: Once | INTRAVENOUS | Status: AC

## 2022-07-14 MED ORDER — FENTANYL CITRATE PF 50 MCG/ML IJ SOSY
PREFILLED_SYRINGE | INTRAMUSCULAR | Status: AC
  Administered 2022-07-14: 50 ug via INTRAVENOUS
  Filled 2022-07-14: qty 2

## 2022-07-14 MED ORDER — ETOMIDATE 2 MG/ML IV SOLN
INTRAVENOUS | Status: AC
  Administered 2022-07-14: 20 mg
  Filled 2022-07-14: qty 10

## 2022-07-14 MED ORDER — POTASSIUM PHOSPHATES 15 MMOLE/5ML IV SOLN
15.0000 mmol | Freq: Once | INTRAVENOUS | Status: DC
  Filled 2022-07-14: qty 5

## 2022-07-14 MED ORDER — SODIUM PHOSPHATES 45 MMOLE/15ML IV SOLN
15.0000 mmol | Freq: Once | INTRAVENOUS | Status: AC
  Administered 2022-07-14: 15 mmol via INTRAVENOUS
  Filled 2022-07-14: qty 5

## 2022-07-14 MED ORDER — ETOMIDATE 2 MG/ML IV SOLN: 20.0000 mg | Freq: Once | INTRAVENOUS | Status: AC

## 2022-07-14 NOTE — Procedures (Signed)
Cortrak  Person Inserting Tube:  Maylon Peppers C, RD Tube Type:  Cortrak - 43 inches Tube Size:  10 Tube Location:  Left nare Secured by: Bridle Technique Used to Measure Tube Placement:  Marking at nare/corner of mouth Cortrak Secured At:  65 cm   Cortrak Tube Team Note:  Consult received to place a Cortrak feeding tube.   X-ray is required, abdominal x-ray has been ordered by the Cortrak team. Please confirm tube placement before using the Cortrak tube.   If the tube becomes dislodged please keep the tube and contact the Cortrak team at [email or web address] for replacement.  If after hours and replacement cannot be delayed, place a NG tube and confirm placement with an abdominal x-ray.    Lockie Pares., RD, LDN, CNSC See AMiON for contact information

## 2022-07-14 NOTE — Progress Notes (Signed)
Pharmacy Electrolyte Replacement  Recent Labs:  Recent Labs    07/14/22 0402  K 4.1  MG 2.2  PHOS 2.1*  CREATININE 0.60    Low Critical Values (K </= 2.5, Phos </= 1, Mg </= 1) Present: None  MD Contacted: n/a - no critical values noted  Plan: NaPhos 21mmol IV x 1 Recheck Phos in AM per protocol   Arturo Morton, PharmD, BCPS Please check AMION for all [place] contact numbers Clinical Pharmacist 07/14/2022 11:16 AM

## 2022-07-14 NOTE — Progress Notes (Signed)
Patient failed extubation today secondary to increased work of breathing and poor level of consciousness.  Patient remains very somnolent.  Will awaken to noxious stimuli but only briefly.  Nonverbal.  Follows commands on the right side.  Left side remains plegic.  Wound clean and dry.  No significant change from my standpoint.  Patient's postoperative scan demonstrates resolution of her convexity subdural hematoma and debulking of her right intracerebral hemorrhage.  Continue supportive efforts.

## 2022-07-14 NOTE — Procedures (Signed)
Intubation Procedure Note  Gina Kelley  [id number]  1936-04-26  Date:07/14/22  Time:3:39 PM   Provider Performing:Shyane Fossum A Lilias Lorensen    Procedure: Intubation (31500)  Indication(s) Respiratory Failure  Consent Risks of the procedure as well as the alternatives and risks of each were explained to the patient and/or caregiver.  Consent for the procedure was obtained and is signed in the bedside chart Spoke with family member Ray over the phone  Anesthesia Etomidate, Fentanyl, and Rocuronium 20 etomidate, 50 fentanyl, 50 rocuronium  Time Out Verified patient identification, verified procedure, site/side was marked, verified correct patient position, special equipment/implants available, medications/allergies/relevant history reviewed, required imaging and test results available.   Sterile Technique Usual hand hygeine, masks, and gloves were used   Procedure Description Patient positioned in bed supine.  Sedation given as noted above.  Patient was intubated with endotracheal tube using Glidescope.  View was Grade 1 full glottis .  Number of attempts was 1.  Colorimetric CO2 detector was consistent with tracheal placement.   Complications/Tolerance None; patient tolerated the procedure well. Chest X-ray is ordered to verify placement.   EBL Minimal   Specimen(s) None

## 2022-07-14 NOTE — Progress Notes (Signed)
Patient is tachycardic, tachypneic, using accessory muscles of respirations Sonorous breathing  She is still following commands  ABG noted with hypercapnia-7.35/55/90    Respiratory rate about 32/min  I did update the patient's family member Gina Kelley about need to place her back on the ventilator

## 2022-07-14 NOTE — Progress Notes (Signed)
Spoke with family member at bedside, updated regarding findings  Patient appears to be weaning well  Still following commands  Order placed for extubation

## 2022-07-14 NOTE — Progress Notes (Signed)
Pt's upper and lower dentures removed from mouth with RT, and placed in denture cup on bedside table.

## 2022-07-14 NOTE — Procedures (Signed)
Extubation Procedure Note  Patient Details:   Name: KAMRY FARACI DOB: 1935/11/14 MRN: [id number]   Airway Documentation:    Vent end date: 07/14/22 Vent end time: 1400   Evaluation  O2 sats: stable throughout Complications: No apparent complications Patient did tolerate procedure well. Bilateral Breath Sounds: Diminished   No  RT extubated patient to 4L [place] per MD order with RN at bedside. Patient had positive cuff leak. Upon extubation patient did well. Shortly after RT and RN noted slight increase in work of breathing and some almost snoring noises from patient. No stridor noted. RT called MD and MD stated to obtain ABG in an hour. RT and RN boosted patient and sat her up. No other orders for RT at this time. RT will obtain ABG in an hour and continue to monitor.   Fabiola Backer 07/14/2022, 2:03 PM

## 2022-07-14 NOTE — Progress Notes (Addendum)
Nutrition Follow-up  DOCUMENTATION CODES:   Non-severe (moderate) malnutrition in context of chronic illness  INTERVENTION:   Tube Feeding via Cortrak:  Glucerna 1.5 at 45 ml/hr This provides 1620 kcals, 89 g of protein and 821 mL of free water  Once appropriate, pt will need free water flushes of 200 mL q 6 hours to provide total of 1621 mL  D/C Pro-Source TF20   NUTRITION DIAGNOSIS:   Moderate Malnutrition related to chronic illness as evidenced by moderate fat depletion, moderate muscle depletion.  Being addressed via TF   GOAL:   Patient will meet greater than or equal to 90% of their needs  Progressing  MONITOR:   Vent status, TF tolerance, Labs  REASON FOR ASSESSMENT:   Consult Enteral/tube feeding initiation and management  ASSESSMENT:   Pt with PMH of DM, HTN, HLD, CKD admitted with ICH with SDH s/p craniotomy.  1/09  s/p R crani and biopsy of suspicious tissue worrisome for metastatic disease per MD  1/15 Extubated  Extubated just now. Plan for gas in 1 hour per MD, pt at high risk for reintubation. Pt snoring, drooling, not able to manage secretions. Currently NPO. Pt would likely benefit from Cortrak placement, regardless of vent status.   Noted TF changed to Glucerna 1.5 at 50 ml/hr on Saturday by MD due to hyperglycemia. Appeared to be tolerating prior to extubation; pt now without enteral access  Currently off 3% NS  Last BM 1/12, bowel regimen ordered. If continues without BM, may need to escalate bowel regimen  Labs: phosphorus 2.1 (L), Creatinine wdl, sodium 145 (wdl), CBGs 123-204 Meds: ss novolog, semglee, sodium phosphate, miralax, senna-docusate   Diet Order:   Diet Order             Diet NPO time specified  Diet effective now                   EDUCATION NEEDS:   Not appropriate for education at this time  Skin:  Skin Assessment: Reviewed RN Assessment  Last BM:  1/12  Height:   Ht Readings from Last 1 Encounters:   07/08/22 5\' 3"  (1.6 m)    Weight:   Wt Readings from Last 1 Encounters:  07/13/22 59.1 kg    BMI:  Body mass index is 23.08 kg/m.  Estimated Nutritional Needs:   Kcal:  1450-1650  Protein:  75-90 grams  Fluid:  > 1.5 L/day   Kerman Passey MS, RDN, LDN, CNSC Registered Dietitian 3 Clinical Nutrition RD Pager and On-Call Pager Number Located in [place]

## 2022-07-14 NOTE — Progress Notes (Signed)
NAME:  Gina Kelley, MRN:  [id number], DOB:  08-19-1935, LOS: 6 ADMISSION DATE:  07/08/2022 CONSULTATION DATE:  07/09/2022 REFERRING MD:  Annette Stable - NSGY CHIEF COMPLAINT:  Vent management s/p crani for IPH   History of Present Illness:  87 year old woman who presented to [facility] ED via EMS 1/9 as a Code Stroke. LKW 1/9 1300. Found by family with L-sided weakness, R gaze deviation. PMHx significant for HTN, HLD, CVA (1998 with residual LLE numbness), T2DM, osteopenia.  On ED arrival, patient had persistent L-sided weakness and R gaze deviation as well as confusion and aphasia; she was hypertensive to SBP 200s. CT Head demonstrated right IPH with 49mm midline shift and right frontoparietal SDH. Neuro consulted with plan for aggressive BP control, Cleviprex and Labetalol started. Patient was intubated in the ED for airway protection in the setting of worsening GCS. NSGY consulted. Concern for possible malignancy/intrinsic brain tumor given irregularity around hemorrhage on CT and vasogenic edema.  Patient was taken to OR 1/9 for R craniotomy/SDH evacuation and possible biopsy versus resection. Intraoperative course was unremarkable; biopsy of suspicious tissue sent to pathology. Postoperative repeat CT Head 1/10 demonstrated resolution of SDH post-crani and some residual hemorrhage without evidence of mass effect.   PCCM consulted for vent management.  Pertinent Medical History:   Past Medical History:  Diagnosis Date   Anemia of chronic disease 07/08/2006   Antral ulcer 02/23/2007   Seen on EGD in 2008, small ulcer with erosion    Cerebral vascular accident ([place]) 04/14/2006   1998, left lower extremity numbness, no residual deficits    Essential hypertension 04/14/2006   Gastroesophageal reflux disease 02/18/2013   Occasional, symptomatically relieved with peptobismol    Hyperlipidemia LDL goal < 100 04/14/2006   Hypertensive retinopathy of both eyes, grade 1 05/15/2016   Osteopenia of right femoral  neck 10/20/2016   DEXA (10/16/2016): R femur T -2.5 (FRAX tool calculates at -2.4), L1-L4 spine T -0.9, 10 year risk for: Major osteoporotic fracture 8.3%, Hip fracture 2.7%   Type 2 diabetes mellitus with stage 1 chronic kidney disease, without long-term current use of insulin ([place])    Type II diabetes mellitus ([place]) 04/14/2006   Significant Hospital Events: Including procedures, antibiotic start and stop dates in addition to other pertinent events   1/9 - Presented via EMS as Code Stroke. CT Head with R IPH/SDH, vasogenic edema c/f ?malignant process. Cleviprex for BP control. Intubated for airway protection. Taken to OR for R crani/SDH evacuation. 1/10 - Repeat CT Head with resolution of SDH, some residual hemorrhage, no mass effect. PCCM consulted for assistance with vent management. 1/12 off sedation and still remains encephalopathic, CT head with Similar size of residual intraparenchymal hemorrhage centered at the right frontal operculum, but with mildly increased localized edema. Mass effect with approximately 5 mm of right-to-left shift, mildly increased from prior. No hydrocephalus or trapping. EEG negative. Increased prominence of small focal hypodensity involving the right basal ganglia, suspicious for an evolving ischemic infarct. 4. Abnormal vasogenic edema involving the anterior right frontal lobe, again concerning for underlying tumor/mass 1/13: started on hypertonic. Tolerating PS for 10 hours 1/14 Interim History / Subjective:  Hypertonic Stopped per NSY. On PS 5/5. Given 2.5 mg oxycodone for pain and then went apneic, placed back on rate  -1/14 Weaning well this morning, following commands-moving right upper extremity and both lower extremities Not opening eyes, but trying  Objective:  Blood pressure (!) 126/55, pulse 73, temperature 99 F (37.2 C), temperature source  Axillary, resp. rate 16, height 5\' 3"  (1.6 m), weight 59.1 kg, SpO2 100 %.    Vent Mode: PRVC FiO2 (%):  [30  %] 30 % Set Rate:  [16 bmp] 16 bmp Vt Set:  [410 mL] 410 mL PEEP:  [5 cmH20] 5 cmH20 Pressure Support:  [5 cmH20] 5 cmH20 Plateau Pressure:  [10 NAT55-73 cmH20] 12 cmH20   Intake/Output Summary (Last 24 hours) at 07/14/2022 0804 Last data filed at 07/14/2022 0700 Gross per 24 hour  Intake 1621.91 ml  Output 2700 ml  Net -1078.09 ml   Filed Weights   07/10/22 0500 07/11/22 0500 07/13/22 0500  Weight: 57.9 kg 58.9 kg 59.1 kg    General: Elderly lady, does not appear to be in distress, HEENT: Endotracheal tube in place, OG tube in place Neuro: Does not open eyes but does squeeze hand on the right side, wiggles both lower extremities CV: S1-S2 appreciated PULM: Clear breath sounds GI: Bowel sounds appreciated Extremities: Skin is warm and dry Skin: no rashes or lesions   Resolved Hospital Problem List:  Hypokalemia CKD stage 1  Assessment & Plan:   Right frontotemporal intraparenchymal hemorrhage Right subdural hematoma s/p evacuation Vasogenic edema with brain compression Right basal ganglia infarct -Presented 1 9 as code stroke -OR 1/9 for right craniotomy/subdural hemorrhage evacuation and biopsy -CT 1/10 demonstrated resolution of subdural hematoma postcraniotomy and some residual hemorrhage without evidence of mass effect. -Right basal ganglia infarct which could be contributing to clinical symptoms, not able to open eyes -Defer MRI to primary service -Continue Keppra -Neuroprotective measures including head of the bed elevation greater than 30 degrees, normoglycemia, normothermia, address electrolyte derangements -PT OT SLP when able to participate in care -Off hypertonic saline-sodium 145  Acute hypoxemic respiratory failure requiring mechanical ventilation -Continue full vent support with low tidal volumes -She became apneic following opiates, avoiding opiates -Continue to wean as tolerated, tolerating pressure support at present -Continue VAP bundle -Continue  pulmonary hygiene  Hypertension Hyperlipidemia Prior CVA in 1998 -On Cozaar at 50 mg -Amlodipine 10 mg -Goal systolic blood pressure less than 160 -Continue home Lipitor, Zetia -Cardiac monitoring  Hypoglycemia Type 2 diabetes -On Semglee 15 units daily -Continue tube feeds -CBGs every 4  Did not have any overnight events Mental status appears to be conducive to being able to extubate if continues to tolerate weaning  Best Practice: (right click and "Reselect all SmartList Selections" daily)   Diet/type: Continue tube feeds DVT prophylaxis: SCDs, no AC in the setting of IPH GI prophylaxis: PPI Lines: D/C Art Line  Foley:  External foley cath  Code Status:  full code Last date of multidisciplinary goals of care discussion [Per Primary Team - NSGY]  Labs:  CBC: Recent Labs  Lab 07/08/22 1509 07/08/22 1512 07/10/22 0453 07/11/22 0452 07/12/22 0616 07/13/22 0618 07/14/22 0402  WBC 7.5   < > 13.2* 12.5* 10.1 9.7 10.4  NEUTROABS 3.5  --   --   --   --   --   --   HGB 10.4*   < > 7.7* 7.4* 7.4* 7.0* 7.9*  HCT 31.3*   < > 23.6* 23.3* 22.9* 21.6* 25.8*  MCV 91.0   < > 92.5 94.0 92.0 92.3 94.9  PLT 256   < > 169 157 157 171 207   < > = values in this interval not displayed.   Basic Metabolic Panel: Recent Labs  Lab 07/11/22 0452 07/11/22 1636 07/11/22 2049 07/12/22 0616 07/12/22 1225 07/12/22 1810 07/13/22 0004  07/13/22 0618 07/14/22 0402  NA 138 136   < > 144  144 144 145 148* 147* 145  K 3.6 4.1  --  4.1  --   --   --  3.9 4.1  CL 111 108  --  114*  --   --   --  116* 109  CO2 22 22  --  23  --   --   --  25 29  GLUCOSE 162* 208*  --  179*  --   --   --  190* 152*  BUN 14 16  --  14  --   --   --  16 20  CREATININE 0.71 0.57  --  0.55  --   --   --  0.54 0.60  CALCIUM 7.5* 7.5*  --  7.6*  --   --   --  7.9* 8.7*  MG 2.4 2.3  --  2.2  --   --   --  2.1 2.2  PHOS 1.4* 2.2*  --  1.7*  --   --   --  2.0* 2.1*   < > = values in this interval not displayed.    GFR: Estimated Creatinine Clearance: 43.3 mL/min (by C-G formula based on SCr of 0.6 mg/dL). Recent Labs  Lab 07/11/22 0452 07/12/22 0616 07/13/22 0618 07/14/22 0402  WBC 12.5* 10.1 9.7 10.4   Liver Function Tests: Recent Labs  Lab 07/08/22 1509 07/13/22 1058  AST 38 44*  ALT 21 27  ALKPHOS 60 61  BILITOT 0.6 0.3  PROT 7.5 5.5*  ALBUMIN 3.6 2.1*   No results for input(s): "LIPASE", "AMYLASE" in the last 168 hours. No results for input(s): "AMMONIA" in the last 168 hours.  ABG:    Component Value Date/Time   PHART 7.409 07/08/2022 2027   PCO2ART 38.5 07/08/2022 2027   PO2ART 183 (H) 07/08/2022 2027   HCO3 24.3 07/08/2022 2027   TCO2 25 07/08/2022 2027   O2SAT 100 07/08/2022 2027    Coagulation Profile: Recent Labs  Lab 07/08/22 1509  INR 1.1   Cardiac Enzymes: No results for input(s): "CKTOTAL", "CKMB", "CKMBINDEX", "TROPONINI" in the last 168 hours.  HbA1C: Hemoglobin A1C  Date/Time Value Ref Range Status  07/12/2021 10:37 AM 6.1 (A) 4.0 - 5.6 % Final  11/23/2020 09:25 AM 5.9 (A) 4.0 - 5.6 % Final   Hgb A1c MFr Bld  Date/Time Value Ref Range Status  04/03/2010 04:14 PM 6.0 %   07/13/2009 01:43 PM 6.1 %    CBG: Recent Labs  Lab 07/13/22 1618 07/13/22 2002 07/13/22 2352 07/14/22 0405 07/14/22 0751  GLUCAP 186* 175* 170* 140* 163*   Review of Systems:   Patient is encephalopathic and/or intubated. Therefore history has been obtained from chart review.   Allergies: Allergies  Allergen Reactions   Enalapril Cough    The patient is critically ill with multiple organ systems failure and requires high complexity decision making for assessment and support, frequent evaluation and titration of therapies, application of advanced monitoring technologies and extensive interpretation of multiple databases. Critical Care Time devoted to patient care services described in this note independent of APP/resident time (if applicable)  is 32 minutes.    Virl Diamond MD [facility] Personal pager: See Amion If unanswered, please page CCM On-call: #[phone]

## 2022-07-15 DIAGNOSIS — Z9889 Other specified postprocedural states: Secondary | ICD-10-CM | POA: Diagnosis not present

## 2022-07-15 LAB — CBC
HCT: 21.7 % — ABNORMAL LOW (ref 36.0–46.0)
Hemoglobin: 7.1 g/dL — ABNORMAL LOW (ref 12.0–15.0)
MCH: 30 pg (ref 26.0–34.0)
MCHC: 32.7 g/dL (ref 30.0–36.0)
MCV: 91.6 fL (ref 80.0–100.0)
Platelets: 212 10*3/uL (ref 150–400)
RBC: 2.37 MIL/uL — ABNORMAL LOW (ref 3.87–5.11)
RDW: 17 % — ABNORMAL HIGH (ref 11.5–15.5)
WBC: 12.2 10*3/uL — ABNORMAL HIGH (ref 4.0–10.5)
nRBC: 0 % (ref 0.0–0.2)

## 2022-07-15 LAB — BASIC METABOLIC PANEL
Anion gap: 10 (ref 5–15)
BUN: 26 mg/dL — ABNORMAL HIGH (ref 8–23)
CO2: 28 mmol/L (ref 22–32)
Calcium: 8.5 mg/dL — ABNORMAL LOW (ref 8.9–10.3)
Chloride: 106 mmol/L (ref 98–111)
Creatinine, Ser: 0.67 mg/dL (ref 0.44–1.00)
GFR, Estimated: >60 mL/min (ref >60)
Glucose, Bld: 162 mg/dL — ABNORMAL HIGH (ref 70–99)
Potassium: 3.9 mmol/L (ref 3.5–5.1)
Sodium: 144 mmol/L (ref 135–145)

## 2022-07-15 LAB — GLUCOSE, CAPILLARY
Glucose-Capillary: 122 mg/dL — ABNORMAL HIGH (ref 70–99)
Glucose-Capillary: 134 mg/dL — ABNORMAL HIGH (ref 70–99)
Glucose-Capillary: 143 mg/dL — ABNORMAL HIGH (ref 70–99)
Glucose-Capillary: 146 mg/dL — ABNORMAL HIGH (ref 70–99)
Glucose-Capillary: 146 mg/dL — ABNORMAL HIGH (ref 70–99)
Glucose-Capillary: 179 mg/dL — ABNORMAL HIGH (ref 70–99)
Glucose-Capillary: 193 mg/dL — ABNORMAL HIGH (ref 70–99)

## 2022-07-15 LAB — POCT I-STAT 7, (LYTES, BLD GAS, ICA,H+H)
Acid-Base Excess: 5 mmol/L — ABNORMAL HIGH (ref 0.0–2.0)
Bicarbonate: 29 mmol/L — ABNORMAL HIGH (ref 20.0–28.0)
Calcium, Ion: 1.24 mmol/L (ref 1.15–1.40)
HCT: 22 % — ABNORMAL LOW (ref 36.0–46.0)
Hemoglobin: 7.5 g/dL — ABNORMAL LOW (ref 12.0–15.0)
O2 Saturation: 96 %
Patient temperature: 100.3
Potassium: 3.9 mmol/L (ref 3.5–5.1)
Sodium: 145 mmol/L (ref 135–145)
TCO2: 30 mmol/L (ref 22–32)
pCO2 arterial: 42.6 mmHg (ref 32–48)
pH, Arterial: 7.445 (ref 7.35–7.45)
pO2, Arterial: 83 mmHg (ref 83–108)

## 2022-07-15 LAB — MAGNESIUM: Magnesium: 2.2 mg/dL (ref 1.7–2.4)

## 2022-07-15 LAB — PHOSPHORUS: Phosphorus: 2.9 mg/dL (ref 2.5–4.6)

## 2022-07-15 NOTE — Progress Notes (Signed)
NAME:  Gina Kelley, MRN:  [id number], DOB:  22-Jan-1936, LOS: 7 ADMISSION DATE:  07/08/2022 CONSULTATION DATE:  07/09/2022 REFERRING MD:  Annette Stable - NSGY CHIEF COMPLAINT:  Vent management s/p crani for IPH   History of Present Illness:  87 year old woman who presented to [facility] ED via EMS 1/9 as a Code Stroke. LKW 1/9 1300. Found by family with L-sided weakness, R gaze deviation. PMHx significant for HTN, HLD, CVA (1998 with residual LLE numbness), T2DM, osteopenia.  On ED arrival, patient had persistent L-sided weakness and R gaze deviation as well as confusion and aphasia; she was hypertensive to SBP 200s. CT Head demonstrated right IPH with 66mm midline shift and right frontoparietal SDH. Neuro consulted with plan for aggressive BP control, Cleviprex and Labetalol started. Patient was intubated in the ED for airway protection in the setting of worsening GCS. NSGY consulted. Concern for possible malignancy/intrinsic brain tumor given irregularity around hemorrhage on CT and vasogenic edema.  Patient was taken to OR 1/9 for R craniotomy/SDH evacuation and possible biopsy versus resection. Intraoperative course was unremarkable; biopsy of suspicious tissue sent to pathology. Postoperative repeat CT Head 1/10 demonstrated resolution of SDH post-crani and some residual hemorrhage without evidence of mass effect.   PCCM consulted for vent management.  Pertinent Medical History:   Past Medical History:  Diagnosis Date   Anemia of chronic disease 07/08/2006   Antral ulcer 02/23/2007   Seen on EGD in 2008, small ulcer with erosion    Cerebral vascular accident ([place]) 04/14/2006   1998, left lower extremity numbness, no residual deficits    Essential hypertension 04/14/2006   Gastroesophageal reflux disease 02/18/2013   Occasional, symptomatically relieved with peptobismol    Hyperlipidemia LDL goal < 100 04/14/2006   Hypertensive retinopathy of both eyes, grade 1 05/15/2016   Osteopenia of right femoral  neck 10/20/2016   DEXA (10/16/2016): R femur T -2.5 (FRAX tool calculates at -2.4), L1-L4 spine T -0.9, 10 year risk for: Major osteoporotic fracture 8.3%, Hip fracture 2.7%   Type 2 diabetes mellitus with stage 1 chronic kidney disease, without long-term current use of insulin ([place])    Type II diabetes mellitus ([place]) 04/14/2006   Significant Hospital Events: Including procedures, antibiotic start and stop dates in addition to other pertinent events   1/9 - Presented via EMS as Code Stroke. CT Head with R IPH/SDH, vasogenic edema c/f ?malignant process. Cleviprex for BP control. Intubated for airway protection. Taken to OR for R crani/SDH evacuation. 1/10 - Repeat CT Head with resolution of SDH, some residual hemorrhage, no mass effect. PCCM consulted for assistance with vent management. 1/12 off sedation and still remains encephalopathic, CT head with Similar size of residual intraparenchymal hemorrhage centered at the right frontal operculum, but with mildly increased localized edema. Mass effect with approximately 5 mm of right-to-left shift, mildly increased from prior. No hydrocephalus or trapping. EEG negative. Increased prominence of small focal hypodensity involving the right basal ganglia, suspicious for an evolving ischemic infarct. 4. Abnormal vasogenic edema involving the anterior right frontal lobe, again concerning for underlying tumor/mass 1/13: started on hypertonic. Tolerating PS for 10 hours 1/15: Extubated, reintubated about 3 hours afterwards for increased work of breathing, sonorous breathing  Interim History / Subjective:  Hypertonic Stopped per NSY. On PS 5/5. Given 2.5 mg oxycodone for pain and then went apneic, placed back on rate  -1/14 Extubated and reintubated 1/15 Weaning well this morning  Objective:  Blood pressure 134/61, pulse 79, temperature (!) 100.7 F (  38.2 C), temperature source Axillary, resp. rate (!) 0, height 5\' 3"  (1.6 m), weight 56.1 kg, SpO2 100 %.     Vent Mode: CPAP;PSV FiO2 (%):  [30 %-50 %] 30 % Set Rate:  [16 bmp] 16 bmp Vt Set:  [410 mL] 410 mL PEEP:  [5 cmH20] 5 cmH20 Pressure Support:  [5 cmH20] 5 cmH20 Plateau Pressure:  [13 cmH20-19 cmH20] 14 cmH20   Intake/Output Summary (Last 24 hours) at 07/15/2022 1324 Last data filed at 07/15/2022 0600 Gross per 24 hour  Intake 1048.23 ml  Output 1300 ml  Net -251.77 ml   Filed Weights   07/11/22 0500 07/13/22 0500 07/15/22 0500  Weight: 58.9 kg 59.1 kg 56.1 kg    General: Elderly lady, does not appear to be in distress  HEENT: Endotracheal tube in place, cortrak in place Neuro: Does not open eyes but does squeeze hand on the right to command CV: S1-S2 appreciated PULM: Clear breath sounds bilaterally GI: Bowel sounds appreciated Extremities: Skin is warm and dry Skin: no rashes or lesions   Resolved Hospital Problem List:  Hypokalemia CKD stage 1  Assessment & Plan:   Right frontotemporal intraparenchymal hemorrhage Right subdural hematoma s/p evacuation Vasogenic edema with brain compression Right basal ganglia infarct -Presented 1/9 as code stroke -Operating room on 1/9 for right craniotomy/subdural hemorrhage evacuation and biopsy -CT 1/10 demonstrated resolution of subdural hematoma postcraniotomy, some residual hemorrhage without evidence of mass effect -Right basal ganglia infarct which could be contributing to clinical symptoms at present -Continue Keppra -Neuroprotective measures including elevation of the head of the bed greater than 30 degrees, normoglycemia, normothermia, address electrolyte derangements -PT OT/SLP when able to participate in care -Off hypertonic saline, sodium level of 145  Acute hypoxemic/hypercapnic respiratory failure Failed extubation 1/15 2024-started retaining CO2 with increased work of breathing -Continue to wean as tolerated -Continue VAP bundle -Continue pulmonary hygiene -Will reattempt trial with extubation  1/17  Hypertension Hyperlipidemia Prior CVA in 1998 -On Cozaar, on amlodipine -Goal systolic pressure less than 160 -Continue Lipitor, Zetia -Cardiac monitoring  Type 2 diabetes -On Semglee 15 units daily -Continue tube feeds -CBG every 4  Continue to wean as tolerated Trial of liberation from the ventilator will be attempted again 1/17   Best Practice: (right click and "Reselect all SmartList Selections" daily)   Diet/type: Continue tube feeds DVT prophylaxis: SCDs, no AC in the setting of IPH GI prophylaxis: PPI Lines: D/C Art Line  Foley:  External foley cath  Code Status:  full code Last date of multidisciplinary goals of care discussion [Per Primary Team - NSGY]  Labs:  CBC: Recent Labs  Lab 07/08/22 1509 07/08/22 1512 07/11/22 0452 07/12/22 0616 07/13/22 0618 07/14/22 0402 07/14/22 1510 07/15/22 0425 07/15/22 0442  WBC 7.5   < > 12.5* 10.1 9.7 10.4  --  12.2*  --   NEUTROABS 3.5  --   --   --   --   --   --   --   --   HGB 10.4*   < > 7.4* 7.4* 7.0* 7.9* 8.2* 7.1* 7.5*  HCT 31.3*   < > 23.3* 22.9* 21.6* 25.8* 24.0* 21.7* 22.0*  MCV 91.0   < > 94.0 92.0 92.3 94.9  --  91.6  --   PLT 256   < > 157 157 171 207  --  212  --    < > = values in this interval not displayed.   Basic Metabolic Panel: Recent Labs  Lab  07/11/22 1636 07/11/22 2049 07/12/22 0616 07/12/22 1225 07/13/22 0618 07/14/22 0402 07/14/22 1510 07/15/22 0425 07/15/22 0442  NA 136   < > 144  144   < > 147* 145 147* 144 145  K 4.1  --  4.1  --  3.9 4.1 3.9 3.9 3.9  CL 108  --  114*  --  116* 109  --  106  --   CO2 22  --  23  --  25 29  --  28  --   GLUCOSE 208*  --  179*  --  190* 152*  --  162*  --   BUN 16  --  14  --  16 20  --  26*  --   CREATININE 0.57  --  0.55  --  0.54 0.60  --  0.67  --   CALCIUM 7.5*  --  7.6*  --  7.9* 8.7*  --  8.5*  --   MG 2.3  --  2.2  --  2.1 2.2  --  2.2  --   PHOS 2.2*  --  1.7*  --  2.0* 2.1*  --  2.9  --    < > = values in this interval not  displayed.   GFR: Estimated Creatinine Clearance: 43.3 mL/min (by C-G formula based on SCr of 0.67 mg/dL). Recent Labs  Lab 07/12/22 0616 07/13/22 0618 07/14/22 0402 07/15/22 0425  WBC 10.1 9.7 10.4 12.2*   Liver Function Tests: Recent Labs  Lab 07/08/22 1509 07/13/22 1058  AST 38 44*  ALT 21 27  ALKPHOS 60 61  BILITOT 0.6 0.3  PROT 7.5 5.5*  ALBUMIN 3.6 2.1*   No results for input(s): "LIPASE", "AMYLASE" in the last 168 hours. No results for input(s): "AMMONIA" in the last 168 hours.  ABG:    Component Value Date/Time   PHART 7.445 07/15/2022 0442   PCO2ART 42.6 07/15/2022 0442   PO2ART 83 07/15/2022 0442   HCO3 29.0 (H) 07/15/2022 0442   TCO2 30 07/15/2022 0442   O2SAT 96 07/15/2022 0442    Coagulation Profile: Recent Labs  Lab 07/08/22 1509  INR 1.1   Cardiac Enzymes: No results for input(s): "CKTOTAL", "CKMB", "CKMBINDEX", "TROPONINI" in the last 168 hours.  HbA1C: Hemoglobin A1C  Date/Time Value Ref Range Status  07/12/2021 10:37 AM 6.1 (A) 4.0 - 5.6 % Final  11/23/2020 09:25 AM 5.9 (A) 4.0 - 5.6 % Final   Hgb A1c MFr Bld  Date/Time Value Ref Range Status  04/03/2010 04:14 PM 6.0 %   07/13/2009 01:43 PM 6.1 %    CBG: Recent Labs  Lab 07/14/22 1521 07/14/22 2007 07/15/22 0001 07/15/22 0359 07/15/22 0802  GLUCAP 88 160* 143* 146* 134*   Review of Systems:   Patient is encephalopathic and/or intubated. Therefore history has been obtained from chart review.   Allergies: Allergies  Allergen Reactions   Enalapril Cough    The patient is critically ill with multiple organ systems failure and requires high complexity decision making for assessment and support, frequent evaluation and titration of therapies, application of advanced monitoring technologies and extensive interpretation of multiple databases. Critical Care Time devoted to patient care services described in this note independent of APP/resident time (if applicable)  is 31 minutes.    Virl Diamond MD [facility] Personal pager: See Amion If unanswered, please page CCM On-call: #[phone]

## 2022-07-15 NOTE — Progress Notes (Signed)
Providing Compassionate, Quality Care - Together   Subjective: Patient with no new issues. She was reintubated yesterday afternoon due to increased work of breathing. She is weaning on the ventilator presently.  Objective: Vital signs in last 24 hours: Temp:  [98.5 F (36.9 C)-100.7 F (38.2 C)] 99.1 F (37.3 C) (01/16 0800) Pulse Rate:  [72-120] 86 (01/16 0900) Resp:  [0-42] 0 (01/16 0900) BP: (114-169)/(54-101) 133/67 (01/16 0900) SpO2:  [88 %-100 %] 100 % (01/16 0900) FiO2 (%):  [30 %-50 %] 30 % (01/16 0745) Weight:  [56.1 kg] 56.1 kg (01/16 0500)  Intake/Output from previous day: 01/15 0701 - 01/16 0700 In: 1368.1 [NG/GT:953.8; IV Piggyback:414.3] Out: 1300 [Urine:1300] Intake/Output this shift: Total I/O In: 235 [NG/GT:135; IV Piggyback:100] Out: -   Intubated PERRLA, Will open eyes with repetitive prompting. Will track and blink to confrontation Follows commands RUE and RLE Flickers to pain LUE, subtle purposeful movement LLE Incision closed with staples. Site is clean, dry, and intact   Lab Results: Recent Labs    07/14/22 0402 07/14/22 1510 07/15/22 0425 07/15/22 0442  WBC 10.4  --  12.2*  --   HGB 7.9*   < > 7.1* 7.5*  HCT 25.8*   < > 21.7* 22.0*  PLT 207  --  212  --    < > = values in this interval not displayed.   BMET Recent Labs    07/14/22 0402 07/14/22 1510 07/15/22 0425 07/15/22 0442  NA 145   < > 144 145  K 4.1   < > 3.9 3.9  CL 109  --  106  --   CO2 29  --  28  --   GLUCOSE 152*  --  162*  --   BUN 20  --  26*  --   CREATININE 0.60  --  0.67  --   CALCIUM 8.7*  --  8.5*  --    < > = values in this interval not displayed.    Studies/Results: DG Abd Portable 1V  Result Date: 07/14/2022 CLINICAL DATA:  Meaning tube placement EXAM: PORTABLE ABDOMEN - 1 VIEW COMPARISON:  Portable exam 1643 hours compared to 07/10/2022 FINDINGS: Tip of feeding tube projects over distal gastric antrum. Nonobstructive bowel gas pattern. Atelectasis  versus infiltrate at lung bases greater on LEFT. Atherosclerotic calcifications aorta. IMPRESSION: Tip of feeding tube projects over distal gastric antrum. Electronically Signed   By: Lavonia Dana M.D.   On: 07/14/2022 17:07   DG CHEST PORT 1 VIEW  Result Date: 07/14/2022 CLINICAL DATA:  Intubation EXAM: PORTABLE CHEST 1 VIEW COMPARISON:  Portable exam 1642 hours compared to 07/13/2022 FINDINGS: Tip of endotracheal tube projects 2.8 cm above carina. Feeding tube extends into stomach. Normal heart size, mediastinal contours, and pulmonary vascularity. Atherosclerotic calcification aorta. Bibasilar opacities question atelectasis or infiltrate. Remaining lungs clear. No pleural effusion or pneumothorax. Bones demineralized. IMPRESSION: Mild bibasilar opacities question atelectasis versus infiltrate, new. Aortic Atherosclerosis (ICD10-I70.0). Electronically Signed   By: Lavonia Dana M.D.   On: 07/14/2022 17:06    Assessment/Plan: Patient is seven days status post craniotomy for evacuation of SDH and ICH by Dr. Annette Stable. Her neuro exam continues to improve. Pathology negative for malignant process. Specimen consistent with thrombus/hematoma. Continue supportive efforts. Appreciate CCM's participation in ventilator and medical management for this patient.    LOS: 7 days    -Per CCM, will trial extubation again on 07/16/2022. -Continue supportive efforts   [name], DNP, AGNP-C Nurse Practitioner  [facility] [address], [address], [place], [place] [address] P: [phone]    F: [phone]  07/15/2022, 11:28 AM

## 2022-07-15 NOTE — Progress Notes (Signed)
[facility] Progress Note Patient Name: Gina Kelley DOB: Apr 15, 1936 MRN: [id number]   Date of Service  07/15/2022  HPI/Events of Note  Patient is intubated and mechanically ventilated and needs restraints to prevent self-extubation.  eICU Interventions  Restraints order renewed.        Kerry Kass Deshawn Witty 07/15/2022, 8:04 PM

## 2022-07-16 DIAGNOSIS — Z9889 Other specified postprocedural states: Secondary | ICD-10-CM | POA: Diagnosis not present

## 2022-07-16 LAB — GLUCOSE, CAPILLARY
Glucose-Capillary: 120 mg/dL — ABNORMAL HIGH (ref 70–99)
Glucose-Capillary: 155 mg/dL — ABNORMAL HIGH (ref 70–99)
Glucose-Capillary: 164 mg/dL — ABNORMAL HIGH (ref 70–99)
Glucose-Capillary: 172 mg/dL — ABNORMAL HIGH (ref 70–99)
Glucose-Capillary: 177 mg/dL — ABNORMAL HIGH (ref 70–99)
Glucose-Capillary: 180 mg/dL — ABNORMAL HIGH (ref 70–99)

## 2022-07-16 NOTE — Progress Notes (Signed)
Providing Compassionate, Quality Care - Together   Subjective: Patient is intubated and sedated. No family at the bedside presently.  Objective: Vital signs in last 24 hours: Temp:  [98.9 F (37.2 C)-100.5 F (38.1 C)] 99.5 F (37.5 C) (01/17 1256) Pulse Rate:  [84-104] 88 (01/17 1546) Resp:  [0-22] 17 (01/17 1546) BP: (122-157)/(57-76) 129/65 (01/17 1546) SpO2:  [96 %-100 %] 96 % (01/17 1500) FiO2 (%):  [30 %] 30 % (01/17 1546) Weight:  [55.4 kg] 55.4 kg (01/17 0500)  Intake/Output from previous day: 01/16 0701 - 01/17 0700 In: 1325 [NG/GT:1125; IV Piggyback:200] Out: 800 [Urine:800] Intake/Output this shift: Total I/O In: 460 [NG/GT:360; IV Piggyback:100] Out: -   Intubated PERRLA, Will open eyes with repetitive prompting. Will track and blink to confrontation Follows commands RUE and RLE Flickers to pain LUE, subtle purposeful movement LLE Incision closed with staples. Site is clean, dry, and intact    Lab Results: Recent Labs    07/14/22 0402 07/14/22 1510 07/15/22 0425 07/15/22 0442  WBC 10.4  --  12.2*  --   HGB 7.9*   < > 7.1* 7.5*  HCT 25.8*   < > 21.7* 22.0*  PLT 207  --  212  --    < > = values in this interval not displayed.   BMET Recent Labs    07/14/22 0402 07/14/22 1510 07/15/22 0425 07/15/22 0442  NA 145   < > 144 145  K 4.1   < > 3.9 3.9  CL 109  --  106  --   CO2 29  --  28  --   GLUCOSE 152*  --  162*  --   BUN 20  --  26*  --   CREATININE 0.60  --  0.67  --   CALCIUM 8.7*  --  8.5*  --    < > = values in this interval not displayed.    Studies/Results: DG Abd Portable 1V  Result Date: 07/14/2022 CLINICAL DATA:  Meaning tube placement EXAM: PORTABLE ABDOMEN - 1 VIEW COMPARISON:  Portable exam 1643 hours compared to 07/10/2022 FINDINGS: Tip of feeding tube projects over distal gastric antrum. Nonobstructive bowel gas pattern. Atelectasis versus infiltrate at lung bases greater on LEFT. Atherosclerotic calcifications aorta.  IMPRESSION: Tip of feeding tube projects over distal gastric antrum. Electronically Signed   By: Lavonia Dana M.D.   On: 07/14/2022 17:07   DG CHEST PORT 1 VIEW  Result Date: 07/14/2022 CLINICAL DATA:  Intubation EXAM: PORTABLE CHEST 1 VIEW COMPARISON:  Portable exam 1642 hours compared to 07/13/2022 FINDINGS: Tip of endotracheal tube projects 2.8 cm above carina. Feeding tube extends into stomach. Normal heart size, mediastinal contours, and pulmonary vascularity. Atherosclerotic calcification aorta. Bibasilar opacities question atelectasis or infiltrate. Remaining lungs clear. No pleural effusion or pneumothorax. Bones demineralized. IMPRESSION: Mild bibasilar opacities question atelectasis versus infiltrate, new. Aortic Atherosclerosis (ICD10-I70.0). Electronically Signed   By: Lavonia Dana M.D.   On: 07/14/2022 17:06    Assessment/Plan: Patient is eight days status post craniotomy for evacuation of SDH and ICH by Dr. Annette Stable. Her neuro exam continues to improve. Pathology negative for malignant process. Specimen consistent with thrombus/hematoma. Patient failed extubation 07/14/2022. Continue supportive efforts. Appreciate CCM's participation in ventilator and medical management for this patient.   LOS: 8 days   -Continue supportive efforts.   [name], DNP, AGNP-C Nurse Practitioner  [facility] [place] [address], [place], [place],  [address] P: [phone]  F: [phone]  07/16/2022, 4:12 PM

## 2022-07-16 NOTE — Progress Notes (Signed)
Spouse and daughter updated at bedside  We do not have a clear path to move forward if she were not to do well following extubation  Continue current lines of treatment

## 2022-07-16 NOTE — Progress Notes (Signed)
NAME:  Gina Kelley, MRN:  [id number], DOB:  02-03-1936, LOS: 8 ADMISSION DATE:  07/08/2022 CONSULTATION DATE:  07/09/2022 REFERRING MD:  Jordan Likes - NSGY CHIEF COMPLAINT:  Vent management s/p crani for IPH   History of Present Illness:  87 year old woman who presented to [facility] ED via EMS 1/9 as a Code Stroke. LKW 1/9 1300. Found by family with L-sided weakness, R gaze deviation. PMHx significant for HTN, HLD, CVA (1998 with residual LLE numbness), T2DM, osteopenia.  On ED arrival, patient had persistent L-sided weakness and R gaze deviation as well as confusion and aphasia; she was hypertensive to SBP 200s. CT Head demonstrated right IPH with 50mm midline shift and right frontoparietal SDH. Neuro consulted with plan for aggressive BP control, Cleviprex and Labetalol started. Patient was intubated in the ED for airway protection in the setting of worsening GCS. NSGY consulted. Concern for possible malignancy/intrinsic brain tumor given irregularity around hemorrhage on CT and vasogenic edema.  Patient was taken to OR 1/9 for R craniotomy/SDH evacuation and possible biopsy versus resection. Intraoperative course was unremarkable; biopsy of suspicious tissue sent to pathology. Postoperative repeat CT Head 1/10 demonstrated resolution of SDH post-crani and some residual hemorrhage without evidence of mass effect.   PCCM consulted for vent management.  Pertinent Medical History:   Past Medical History:  Diagnosis Date   Anemia of chronic disease 07/08/2006   Antral ulcer 02/23/2007   Seen on EGD in 2008, small ulcer with erosion    Cerebral vascular accident (HCC) 04/14/2006   1998, left lower extremity numbness, no residual deficits    Essential hypertension 04/14/2006   Gastroesophageal reflux disease 02/18/2013   Occasional, symptomatically relieved with peptobismol    Hyperlipidemia LDL goal < 100 04/14/2006   Hypertensive retinopathy of both eyes, grade 1 05/15/2016   Osteopenia of right femoral  neck 10/20/2016   DEXA (10/16/2016): R femur T -2.5 (FRAX tool calculates at -2.4), L1-L4 spine T -0.9, 10 year risk for: Major osteoporotic fracture 8.3%, Hip fracture 2.7%   Type 2 diabetes mellitus with stage 1 chronic kidney disease, without long-term current use of insulin (HCC)    Type II diabetes mellitus (HCC) 04/14/2006   Significant Hospital Events: Including procedures, antibiotic start and stop dates in addition to other pertinent events   1/9 - Presented via EMS as Code Stroke. CT Head with R IPH/SDH, vasogenic edema c/f ?malignant process. Cleviprex for BP control. Intubated for airway protection. Taken to OR for R crani/SDH evacuation. 1/10 - Repeat CT Head with resolution of SDH, some residual hemorrhage, no mass effect. PCCM consulted for assistance with vent management. 1/12 off sedation and still remains encephalopathic, CT head with Similar size of residual intraparenchymal hemorrhage centered at the right frontal operculum, but with mildly increased localized edema. Mass effect with approximately 5 mm of right-to-left shift, mildly increased from prior. No hydrocephalus or trapping. EEG negative. Increased prominence of small focal hypodensity involving the right basal ganglia, suspicious for an evolving ischemic infarct. 4. Abnormal vasogenic edema involving the anterior right frontal lobe, again concerning for underlying tumor/mass 1/13: started on hypertonic. Tolerating PS for 10 hours 1/15: Extubated, reintubated about 3 hours afterwards for increased work of breathing, sonorous breathing  Interim History / Subjective:  Extubated and reintubated 1/15 Appears to be weaning well this morning  Objective:  Blood pressure 138/62, pulse 89, temperature 100 F (37.8 C), temperature source Oral, resp. rate (!) 0, height 5\' 3"  (1.6 m), weight 55.4 kg, SpO2 99 %.  Vent Mode: PRVC FiO2 (%):  [30 %] 30 % Set Rate:  [16 bmp] 16 bmp Vt Set:  [410 mL] 410 mL PEEP:  [5 cmH20] 5  cmH20 Pressure Support:  [5 cmH20] 5 cmH20 Plateau Pressure:  [8 YHC62-37 cmH20] 8 cmH20   Intake/Output Summary (Last 24 hours) at 07/16/2022 0941 Last data filed at 07/16/2022 0900 Gross per 24 hour  Intake 1280 ml  Output 800 ml  Net 480 ml   Filed Weights   07/13/22 0500 07/15/22 0500 07/16/22 0500  Weight: 59.1 kg 56.1 kg 55.4 kg    General: Elderly lady who does not appear to be in distress HEENT: Endotracheal tube in place, cortrak in place Neuro: Does not open eyes but does squeeze hand to command on the right, moving both lower extremities  CV: S1-S2 appreciated PULM: Clear breath sounds bilaterally GI: Bowel sounds appreciated Extremities: Skin remains warm and dry Skin: no rashes or lesions   Resolved Hospital Problem List:  Hypokalemia CKD stage 1  Assessment & Plan:   Right frontotemporal intraparenchymal hemorrhage Right subdural hematoma s/p evacuation Vasogenic edema with brain compression Right basal ganglia infarct -Presented 1/9 as a code stroke -Was in operating room 1/9 for right craniotomy/subdural hemorrhage evacuation and biopsy -CT 1/10 demonstrated resolution of subdural hematoma postcraniotomy, some residual hemorrhage without evidence of mass effect -Right basal ganglia infarct which could be contributing to clinical symptoms at present -Continue neuroprotective measures -Continue Keppra -Off hypertonic saline  Acute hypoxemic/hypercapnic respiratory failure -Failed extubation 07/04/18/2024, was retaining CO2 and increased work of breathing -Weaning well today -Continue VAP bundle -Continue pulmonary hygiene -Will continue to discuss with family regarding plan of care if we extubate patient again and she is not able to protect the back of her throat  Hypertension Hyperlipidemia Prior CVA in 1998 -On Cozaar, amlodipine -Goal systolic pressure less than 160 Continue Lipitor, Zetia -Cardiac monitor  Type 2 diabetes -On Semglee 15 units  daily -Continue tube feeds -CBG every 4 hours  Continue to wean as tolerated Trial of liberation from the ventilator will be attempted today following discussions with the family  Prognosis remains guarded  Best Practice: (right click and "Reselect all SmartList Selections" daily)   Diet/type: Continue tube feeds DVT prophylaxis: SCDs, no AC in the setting of IPH GI prophylaxis: PPI Lines: D/C Art Line  Foley:  External foley cath  Code Status:  full code Last date of multidisciplinary goals of care discussion [Per Primary Team - NSGY]  Labs:  CBC: Recent Labs  Lab 07/11/22 0452 07/12/22 0616 07/13/22 0618 07/14/22 0402 07/14/22 1510 07/15/22 0425 07/15/22 0442  WBC 12.5* 10.1 9.7 10.4  --  12.2*  --   HGB 7.4* 7.4* 7.0* 7.9* 8.2* 7.1* 7.5*  HCT 23.3* 22.9* 21.6* 25.8* 24.0* 21.7* 22.0*  MCV 94.0 92.0 92.3 94.9  --  91.6  --   PLT 157 157 171 207  --  212  --    Basic Metabolic Panel: Recent Labs  Lab 07/11/22 1636 07/11/22 2049 07/12/22 0616 07/12/22 1225 07/13/22 0618 07/14/22 0402 07/14/22 1510 07/15/22 0425 07/15/22 0442  NA 136   < > 144  144   < > 147* 145 147* 144 145  K 4.1  --  4.1  --  3.9 4.1 3.9 3.9 3.9  CL 108  --  114*  --  116* 109  --  106  --   CO2 22  --  23  --  25 29  --  28  --   GLUCOSE 208*  --  179*  --  190* 152*  --  162*  --   BUN 16  --  14  --  16 20  --  26*  --   CREATININE 0.57  --  0.55  --  0.54 0.60  --  0.67  --   CALCIUM 7.5*  --  7.6*  --  7.9* 8.7*  --  8.5*  --   MG 2.3  --  2.2  --  2.1 2.2  --  2.2  --   PHOS 2.2*  --  1.7*  --  2.0* 2.1*  --  2.9  --    < > = values in this interval not displayed.   GFR: Estimated Creatinine Clearance: 43.3 mL/min (by C-G formula based on SCr of 0.67 mg/dL). Recent Labs  Lab 07/12/22 0616 07/13/22 0618 07/14/22 0402 07/15/22 0425  WBC 10.1 9.7 10.4 12.2*   Liver Function Tests: Recent Labs  Lab 07/13/22 1058  AST 44*  ALT 27  ALKPHOS 61  BILITOT 0.3  PROT 5.5*   ALBUMIN 2.1*   No results for input(s): "LIPASE", "AMYLASE" in the last 168 hours. No results for input(s): "AMMONIA" in the last 168 hours.  ABG:    Component Value Date/Time   PHART 7.445 07/15/2022 0442   PCO2ART 42.6 07/15/2022 0442   PO2ART 83 07/15/2022 0442   HCO3 29.0 (H) 07/15/2022 0442   TCO2 30 07/15/2022 0442   O2SAT 96 07/15/2022 0442    Coagulation Profile: No results for input(s): "INR", "PROTIME" in the last 168 hours.  Cardiac Enzymes: No results for input(s): "CKTOTAL", "CKMB", "CKMBINDEX", "TROPONINI" in the last 168 hours.  HbA1C: Hemoglobin A1C  Date/Time Value Ref Range Status  07/12/2021 10:37 AM 6.1 (A) 4.0 - 5.6 % Final  11/23/2020 09:25 AM 5.9 (A) 4.0 - 5.6 % Final   Hgb A1c MFr Bld  Date/Time Value Ref Range Status  04/03/2010 04:14 PM 6.0 %   07/13/2009 01:43 PM 6.1 %    CBG: Recent Labs  Lab 07/15/22 1557 07/15/22 2003 07/15/22 2352 07/16/22 0359 07/16/22 0801  GLUCAP 122* 179* 193* 164* 180*   Review of Systems:   Patient is encephalopathic and/or intubated. Therefore history has been obtained from chart review.   Allergies: Allergies  Allergen Reactions   Enalapril Cough    The patient is critically ill with multiple organ systems failure and requires high complexity decision making for assessment and support, frequent evaluation and titration of therapies, application of advanced monitoring technologies and extensive interpretation of multiple databases. Critical Care Time devoted to patient care services described in this note independent of APP/resident time (if applicable)  is 32 minutes.   Sherrilyn Rist MD [facility] Personal pager: See Amion If unanswered, please page CCM On-call: [phone]

## 2022-07-17 DIAGNOSIS — J9601 Acute respiratory failure with hypoxia: Secondary | ICD-10-CM

## 2022-07-17 LAB — POCT I-STAT 7, (LYTES, BLD GAS, ICA,H+H)
Acid-Base Excess: 9 mmol/L — ABNORMAL HIGH (ref 0.0–2.0)
Bicarbonate: 34 mmol/L — ABNORMAL HIGH (ref 20.0–28.0)
Calcium, Ion: 1.2 mmol/L (ref 1.15–1.40)
HCT: 22 % — ABNORMAL LOW (ref 36.0–46.0)
Hemoglobin: 7.5 g/dL — ABNORMAL LOW (ref 12.0–15.0)
O2 Saturation: 100 %
Patient temperature: 99.1
Potassium: 4.1 mmol/L (ref 3.5–5.1)
Sodium: 146 mmol/L — ABNORMAL HIGH (ref 135–145)
TCO2: 35 mmol/L — ABNORMAL HIGH (ref 22–32)
pCO2 arterial: 50.2 mmHg — ABNORMAL HIGH (ref 32–48)
pH, Arterial: 7.44 (ref 7.35–7.45)
pO2, Arterial: 282 mmHg — ABNORMAL HIGH (ref 83–108)

## 2022-07-17 LAB — URINALYSIS, ROUTINE W REFLEX MICROSCOPIC
Bilirubin Urine: NEGATIVE
Glucose, UA: NEGATIVE mg/dL
Hgb urine dipstick: NEGATIVE
Ketones, ur: NEGATIVE mg/dL
Nitrite: NEGATIVE
Protein, ur: 100 mg/dL — AB
Specific Gravity, Urine: 1.018 (ref 1.005–1.030)
pH: 8 (ref 5.0–8.0)

## 2022-07-17 LAB — CBC WITH DIFFERENTIAL/PLATELET
Abs Immature Granulocytes: 0.06 10*3/uL (ref 0.00–0.07)
Basophils Absolute: 0 10*3/uL (ref 0.0–0.1)
Basophils Relative: 0 %
Eosinophils Absolute: 0.2 10*3/uL (ref 0.0–0.5)
Eosinophils Relative: 1 %
HCT: 27.4 % — ABNORMAL LOW (ref 36.0–46.0)
Hemoglobin: 8.6 g/dL — ABNORMAL LOW (ref 12.0–15.0)
Immature Granulocytes: 1 %
Lymphocytes Relative: 7 %
Lymphs Abs: 0.9 10*3/uL (ref 0.7–4.0)
MCH: 29.3 pg (ref 26.0–34.0)
MCHC: 31.4 g/dL (ref 30.0–36.0)
MCV: 93.2 fL (ref 80.0–100.0)
Monocytes Absolute: 1.1 10*3/uL — ABNORMAL HIGH (ref 0.1–1.0)
Monocytes Relative: 9 %
Neutro Abs: 10.6 10*3/uL — ABNORMAL HIGH (ref 1.7–7.7)
Neutrophils Relative %: 82 %
Platelets: 257 10*3/uL (ref 150–400)
RBC: 2.94 MIL/uL — ABNORMAL LOW (ref 3.87–5.11)
RDW: 16.7 % — ABNORMAL HIGH (ref 11.5–15.5)
WBC: 12.9 10*3/uL — ABNORMAL HIGH (ref 4.0–10.5)
nRBC: 0 % (ref 0.0–0.2)

## 2022-07-17 LAB — COMPREHENSIVE METABOLIC PANEL
ALT: 93 U/L — ABNORMAL HIGH (ref 0–44)
AST: 133 U/L — ABNORMAL HIGH (ref 15–41)
Albumin: 2.1 g/dL — ABNORMAL LOW (ref 3.5–5.0)
Alkaline Phosphatase: 78 U/L (ref 38–126)
Anion gap: 11 (ref 5–15)
BUN: 39 mg/dL — ABNORMAL HIGH (ref 8–23)
CO2: 29 mmol/L (ref 22–32)
Calcium: 8.7 mg/dL — ABNORMAL LOW (ref 8.9–10.3)
Chloride: 104 mmol/L (ref 98–111)
Creatinine, Ser: 0.76 mg/dL (ref 0.44–1.00)
GFR, Estimated: >60 mL/min (ref >60)
Glucose, Bld: 163 mg/dL — ABNORMAL HIGH (ref 70–99)
Potassium: 4.3 mmol/L (ref 3.5–5.1)
Sodium: 144 mmol/L (ref 135–145)
Total Bilirubin: 0.2 mg/dL — ABNORMAL LOW (ref 0.3–1.2)
Total Protein: 6.1 g/dL — ABNORMAL LOW (ref 6.5–8.1)

## 2022-07-17 LAB — GLUCOSE, CAPILLARY
Glucose-Capillary: 147 mg/dL — ABNORMAL HIGH (ref 70–99)
Glucose-Capillary: 150 mg/dL — ABNORMAL HIGH (ref 70–99)
Glucose-Capillary: 151 mg/dL — ABNORMAL HIGH (ref 70–99)
Glucose-Capillary: 165 mg/dL — ABNORMAL HIGH (ref 70–99)
Glucose-Capillary: 172 mg/dL — ABNORMAL HIGH (ref 70–99)
Glucose-Capillary: 189 mg/dL — ABNORMAL HIGH (ref 70–99)

## 2022-07-17 MED ORDER — FREE WATER
200.0000 mL | Freq: Four times a day (QID) | Status: DC
  Administered 2022-07-17 – 2022-08-02 (×58): 200 mL

## 2022-07-17 MED ORDER — MIDAZOLAM HCL 2 MG/2ML IJ SOLN
INTRAMUSCULAR | Status: AC
  Filled 2022-07-17: qty 2

## 2022-07-17 MED ORDER — ETOMIDATE 2 MG/ML IV SOLN: 20.0000 mg | Freq: Once | INTRAVENOUS | Status: AC

## 2022-07-17 MED ORDER — FENTANYL CITRATE PF 50 MCG/ML IJ SOSY: 50.0000 ug | PREFILLED_SYRINGE | Freq: Once | INTRAMUSCULAR | Status: DC

## 2022-07-17 MED ORDER — ROCURONIUM BROMIDE 10 MG/ML (PF) SYRINGE
PREFILLED_SYRINGE | INTRAVENOUS | Status: AC
  Administered 2022-07-17: 50 mg via INTRAVENOUS
  Filled 2022-07-17: qty 10

## 2022-07-17 MED ORDER — SUCCINYLCHOLINE CHLORIDE 200 MG/10ML IV SOSY
PREFILLED_SYRINGE | INTRAVENOUS | Status: AC
  Filled 2022-07-17: qty 10

## 2022-07-17 MED ORDER — FENTANYL CITRATE PF 50 MCG/ML IJ SOSY: 100.0000 ug | PREFILLED_SYRINGE | Freq: Once | INTRAMUSCULAR | Status: AC

## 2022-07-17 MED ORDER — FENTANYL CITRATE PF 50 MCG/ML IJ SOSY
PREFILLED_SYRINGE | INTRAMUSCULAR | Status: AC
  Administered 2022-07-17: 100 ug via INTRAVENOUS
  Filled 2022-07-17: qty 2

## 2022-07-17 MED ORDER — ETOMIDATE 2 MG/ML IV SOLN
INTRAVENOUS | Status: AC
  Administered 2022-07-17: 20 mg via INTRAVENOUS
  Filled 2022-07-17: qty 20

## 2022-07-17 MED ORDER — KETAMINE HCL 50 MG/5ML IJ SOSY
PREFILLED_SYRINGE | INTRAMUSCULAR | Status: AC
  Filled 2022-07-17: qty 10

## 2022-07-17 MED ORDER — ROCURONIUM BROMIDE 50 MG/5ML IV SOLN: 50.0000 mg | Freq: Once | INTRAVENOUS | Status: AC

## 2022-07-17 NOTE — Procedures (Signed)
Intubation Procedure Note  SHAWNTEL FARNWORTH  [id number]  [birth year]  Date:07/17/22  Time:2:25 PM   Provider Performing:Lyden Redner A Asanti Craigo    Procedure: Intubation (79038)  Indication(s) Respiratory Failure  Consent Unable to obtain consent due to emergent nature of procedure.   Anesthesia Etomidate, Fentanyl, and Rocuronium 20 mg etomidate, 100 mcg fentanyl, 50 mg rocuronium  Time Out Verified patient identification, verified procedure, site/side was marked, verified correct patient position, special equipment/implants available, medications/allergies/relevant history reviewed, required imaging and test results available.   Sterile Technique Usual hand hygeine, masks, and gloves were used   Procedure Description Patient positioned in bed supine.  Sedation given as noted above.  Patient was intubated with endotracheal tube using Glidescope.  View was Grade 1 full glottis .  Number of attempts was 1.  Colorimetric CO2 detector was consistent with tracheal placement.   Complications/Tolerance None; patient tolerated the procedure well. Chest X-ray is ordered to verify placement.   EBL Minimal   Specimen(s) None

## 2022-07-17 NOTE — Procedures (Signed)
Extubation Procedure Note  Patient Details:   Name: Gina Kelley DOB: 10-11-1935 MRN: [id number]   Airway Documentation:  Airway 7.5 mm (Active)  Secured at (cm) 25 cm 07/17/22 1410  Measured From Lips 07/17/22 [place] 07/17/22 1410  Secured By [employer] 07/17/22 1410  Cuff Pressure (cm H2O) Green OR 18-26 CmH2O 07/17/22 1410   Vent end date: 07/17/22 Vent end time: 1400   Prior to extubation, + cuff leak test.  Airway suctioned.    Evaluation  O2 sats: stable throughout Complications: Complications of pt not able to maintain airway, obstructive airway/breathing.   Patient did not tolerate procedure well. Bilateral Breath Sounds: Diminished, Clear   No  Extubated to [place], Post extubation, pt immediately developed obstructive airway/breathing and snoring sounds. Pt immediately placed on bipap 100% fio2 to assist ventilation, sat remained stable t/o w/ no desat episodes- sat 94-98% , MD was notified immediately and MD came to bedside to re-intubate.    07/17/2022, 2:38 PM

## 2022-07-17 NOTE — Progress Notes (Signed)
   Providing Compassionate, Quality Care - Together   Subjective: Patient is intubated and sedated.  Objective: Vital signs in last 24 hours: Temp:  [98.9 F (37.2 C)-100.5 F (38.1 C)] 100.3 F (37.9 C) (01/18 0800) Pulse Rate:  [77-98] 83 (01/18 1000) Resp:  [0-21] 0 (01/18 1000) BP: (110-144)/(57-81) 110/78 (01/18 1000) SpO2:  [96 %-100 %] 97 % (01/18 1000) FiO2 (%):  [30 %] 30 % (01/18 0810) Weight:  [53.3 kg] 53.3 kg (01/18 0420)  Intake/Output from previous day: 01/17 0701 - 01/18 0700 In: 1380 [NG/GT:1180; IV Piggyback:200] Out: 325 [Urine:325] Intake/Output this shift: Total I/O In: 235 [NG/GT:135; IV Piggyback:100] Out: -   Intubated PERRLA, Will open eyes with repetitive prompting. Will track and blink to confrontation Follows commands RUE and RLE Flickers to pain LUE, subtle purposeful movement LLE Incision closed with staples. Site is clean, dry, and intact    Lab Results: Recent Labs    07/15/22 0425 07/15/22 0442 07/17/22 0457  WBC 12.2*  --  12.9*  HGB 7.1* 7.5* 8.6*  HCT 21.7* 22.0* 27.4*  PLT 212  --  257   BMET Recent Labs    07/15/22 0425 07/15/22 0442 07/17/22 0457  NA 144 145 144  K 3.9 3.9 4.3  CL 106  --  104  CO2 28  --  29  GLUCOSE 162*  --  163*  BUN 26*  --  39*  CREATININE 0.67  --  0.76  CALCIUM 8.5*  --  8.7*    Studies/Results: No results found.  Assessment/Plan: Patient is nine days status post craniotomy for evacuation of SDH and ICH by Dr. Annette Stable. Her neuro exam continues to improve. Pathology negative for malignant process. Specimen consistent with thrombus/hematoma. Patient failed extubation 07/14/2022. CCM is planning to trial extubation again today. If patient fails, she will be re-intubated and tracheostomy will be scheduled. Continue supportive efforts. Appreciate CCM's participation in ventilator and medical management for this patient.    LOS: 9 days     [name], DNP, AGNP-C Nurse  Practitioner  [facility] [place] [address], [place], [place], [place] [address] P: [phone]    F: [phone]  07/17/2022, 11:47 AM

## 2022-07-17 NOTE — Progress Notes (Signed)
  Progress Note   Date: 07/17/2022  Patient Name: Gina Kelley        MRN#: [id number]  Clarification of diagnosis:  moderate malnutrition

## 2022-07-17 NOTE — Progress Notes (Signed)
NAME:  Gina Kelley, MRN:  [id number], DOB:  July 24, 1935, LOS: 9 ADMISSION DATE:  07/08/2022 CONSULTATION DATE:  07/09/2022 REFERRING MD:  Annette Stable - NSGY CHIEF COMPLAINT:  Vent management s/p crani for IPH   History of Present Illness:  87 year old woman who presented to [facility] ED via EMS 1/9 as a Code Stroke. LKW 1/9 1300. Found by family with L-sided weakness, R gaze deviation. PMHx significant for HTN, HLD, CVA (1998 with residual LLE numbness), T2DM, osteopenia.  On ED arrival, patient had persistent L-sided weakness and R gaze deviation as well as confusion and aphasia; she was hypertensive to SBP 200s. CT Head demonstrated right IPH with 2mm midline shift and right frontoparietal SDH. Neuro consulted with plan for aggressive BP control, Cleviprex and Labetalol started. Patient was intubated in the ED for airway protection in the setting of worsening GCS. NSGY consulted. Concern for possible malignancy/intrinsic brain tumor given irregularity around hemorrhage on CT and vasogenic edema.  Patient was taken to OR 1/9 for R craniotomy/SDH evacuation and possible biopsy versus resection. Intraoperative course was unremarkable; biopsy of suspicious tissue sent to pathology. Postoperative repeat CT Head 1/10 demonstrated resolution of SDH post-crani and some residual hemorrhage without evidence of mass effect.   PCCM consulted for vent management.  Pertinent Medical History:   Past Medical History:  Diagnosis Date   Anemia of chronic disease 07/08/2006   Antral ulcer 02/23/2007   Seen on EGD in 2008, small ulcer with erosion    Cerebral vascular accident ([place]) 04/14/2006   1998, left lower extremity numbness, no residual deficits    Essential hypertension 04/14/2006   Gastroesophageal reflux disease 02/18/2013   Occasional, symptomatically relieved with peptobismol    Hyperlipidemia LDL goal < 100 04/14/2006   Hypertensive retinopathy of both eyes, grade 1 05/15/2016   Osteopenia of right femoral  neck 10/20/2016   DEXA (10/16/2016): R femur T -2.5 (FRAX tool calculates at -2.4), L1-L4 spine T -0.9, 10 year risk for: Major osteoporotic fracture 8.3%, Hip fracture 2.7%   Type 2 diabetes mellitus with stage 1 chronic kidney disease, without long-term current use of insulin ([place])    Type II diabetes mellitus ([place]) 04/14/2006   Significant Hospital Events: Including procedures, antibiotic start and stop dates in addition to other pertinent events   1/9 - Presented via EMS as Code Stroke. CT Head with R IPH/SDH, vasogenic edema c/f ?malignant process. Cleviprex for BP control. Intubated for airway protection. Taken to OR for R crani/SDH evacuation. 1/10 - Repeat CT Head with resolution of SDH, some residual hemorrhage, no mass effect. PCCM consulted for assistance with vent management. 1/12 off sedation and still remains encephalopathic, CT head with Similar size of residual intraparenchymal hemorrhage centered at the right frontal operculum, but with mildly increased localized edema. Mass effect with approximately 5 mm of right-to-left shift, mildly increased from prior. No hydrocephalus or trapping. EEG negative. Increased prominence of small focal hypodensity involving the right basal ganglia, suspicious for an evolving ischemic infarct. 4. Abnormal vasogenic edema involving the anterior right frontal lobe, again concerning for underlying tumor/mass 1/13: started on hypertonic. Tolerating PS for 10 hours 1/15: Extubated, reintubated about 3 hours afterwards for increased work of breathing, sonorous breathing  Interim History / Subjective:  Extubated and reintubated 1/15 No overnight events  Objective:  Blood pressure 124/61, pulse 81, temperature 99 F (37.2 C), temperature source Axillary, resp. rate (!) 0, height 5\' 3"  (1.6 m), weight 53.3 kg, SpO2 99 %.    Vent Mode:  PRVC FiO2 (%):  [30 %] 30 % Set Rate:  [16 bmp] 16 bmp Vt Set:  [410 mL] 410 mL PEEP:  [5 cmH20] 5 cmH20 Pressure  Support:  [5 cmH20-8 cmH20] 8 cmH20 Plateau Pressure:  [9 cmH20-14 cmH20] 14 cmH20   Intake/Output Summary (Last 24 hours) at 07/17/2022 0735 Last data filed at 07/17/2022 0700 Gross per 24 hour  Intake 1380 ml  Output 325 ml  Net 1055 ml   Filed Weights   07/15/22 0500 07/16/22 0500 07/17/22 0420  Weight: 56.1 kg 55.4 kg 53.3 kg    General: Elderly lady, does not appear to be in distress HEENT: Endotracheal tube in place, cortrak in place Neuro: Squeezes on the right hand, moving both lower extremities, does not open eyes CV: S1-S2 appreciated PULM: Clear breath sounds bilaterally GI: Bowel sounds appreciated Extremities: Skin remains warm and dry Skin: no rashes or lesions   Resolved Hospital Problem List:  Hypokalemia CKD stage 1  Assessment & Plan:   Right frontotemporal intraparenchymal hemorrhage Right subdural hematoma s/p evacuation Vasogenic edema with brain compression Right basal ganglia infarct -Code stroke 1/9 -OR 1/9 for craniotomy/subdural hemorrhage evacuation and biopsy -Right basal ganglia infarct which could be contributing to clinical symptoms at present -Continue neuroprotective measures -Continue Keppra -Off hypertonic saline  Acute hypoxic/hypercapnic respiratory failure -Failed extubation 07/14/2022, was retaining CO2 and increased work of breathing -Continues to wean well -Continue pulmonary hygiene -Continue VAP bundle -Will continue to liaise with family regarding extubation plans and plans if she were not to do well following extubation  Hypertension Hyperlipidemia Prior CVA in 1998 -On Cozaar, amlodipine -Goal systolic pressure less than 160 -Continue Lipitor and Zetia -Cardiac monitoring  Type 2 diabetes -Continue Semglee Continue CBG every 4 hours  Continue ongoing discussion with family to have a clear path going forward She did fail extubation once 1/15, weaning well -May remain unable to protect airway  Prognosis remains  guarded  Best Practice: (right click and "Reselect all SmartList Selections" daily)   Diet/type: Continue tube feeds DVT prophylaxis: SCDs, no AC in the setting of IPH GI prophylaxis: PPI Lines: D/C Art Line  Foley:  External foley cath  Code Status:  full code Last date of multidisciplinary goals of care discussion [Per Primary Team - NSGY]  Labs:  CBC: Recent Labs  Lab 07/12/22 0616 07/13/22 0618 07/14/22 0402 07/14/22 1510 07/15/22 0425 07/15/22 0442 07/17/22 0457  WBC 10.1 9.7 10.4  --  12.2*  --  12.9*  NEUTROABS  --   --   --   --   --   --  10.6*  HGB 7.4* 7.0* 7.9* 8.2* 7.1* 7.5* 8.6*  HCT 22.9* 21.6* 25.8* 24.0* 21.7* 22.0* 27.4*  MCV 92.0 92.3 94.9  --  91.6  --  93.2  PLT 157 171 207  --  212  --  257   Basic Metabolic Panel: Recent Labs  Lab 07/11/22 1636 07/11/22 2049 07/12/22 0616 07/12/22 1225 07/13/22 0618 07/14/22 0402 07/14/22 1510 07/15/22 0425 07/15/22 0442 07/17/22 0457  NA 136   < > 144  144   < > 147* 145 147* 144 145 144  K 4.1  --  4.1  --  3.9 4.1 3.9 3.9 3.9 4.3  CL 108  --  114*  --  116* 109  --  106  --  104  CO2 22  --  23  --  25 29  --  28  --  29  GLUCOSE 208*  --  179*  --  190* 152*  --  162*  --  163*  BUN 16  --  14  --  16 20  --  26*  --  39*  CREATININE 0.57  --  0.55  --  0.54 0.60  --  0.67  --  0.76  CALCIUM 7.5*  --  7.6*  --  7.9* 8.7*  --  8.5*  --  8.7*  MG 2.3  --  2.2  --  2.1 2.2  --  2.2  --   --   PHOS 2.2*  --  1.7*  --  2.0* 2.1*  --  2.9  --   --    < > = values in this interval not displayed.   GFR: Estimated Creatinine Clearance: 43.3 mL/min (by C-G formula based on SCr of 0.76 mg/dL). Recent Labs  Lab 07/13/22 0618 07/14/22 0402 07/15/22 0425 07/17/22 0457  WBC 9.7 10.4 12.2* 12.9*   Liver Function Tests: Recent Labs  Lab 07/13/22 1058 07/17/22 0457  AST 44* 133*  ALT 27 93*  ALKPHOS 61 78  BILITOT 0.3 0.2*  PROT 5.5* 6.1*  ALBUMIN 2.1* 2.1*   No results for input(s): "LIPASE",  "AMYLASE" in the last 168 hours. No results for input(s): "AMMONIA" in the last 168 hours.  ABG:    Component Value Date/Time   PHART 7.445 07/15/2022 0442   PCO2ART 42.6 07/15/2022 0442   PO2ART 83 07/15/2022 0442   HCO3 29.0 (H) 07/15/2022 0442   TCO2 30 07/15/2022 0442   O2SAT 96 07/15/2022 0442    Coagulation Profile: No results for input(s): "INR", "PROTIME" in the last 168 hours.  Cardiac Enzymes: No results for input(s): "CKTOTAL", "CKMB", "CKMBINDEX", "TROPONINI" in the last 168 hours.  HbA1C: Hemoglobin A1C  Date/Time Value Ref Range Status  07/12/2021 10:37 AM 6.1 (A) 4.0 - 5.6 % Final  11/23/2020 09:25 AM 5.9 (A) 4.0 - 5.6 % Final   Hgb A1c MFr Bld  Date/Time Value Ref Range Status  04/03/2010 04:14 PM 6.0 %   07/13/2009 01:43 PM 6.1 %    CBG: Recent Labs  Lab 07/16/22 1215 07/16/22 1514 07/16/22 2005 07/16/22 2345 07/17/22 0404  GLUCAP 177* 155* 120* 172* 147*   Review of Systems:   Patient is encephalopathic and/or intubated. Therefore history has been obtained from chart review.   Allergies: Allergies  Allergen Reactions   Enalapril Cough    The patient is critically ill with multiple organ systems failure and requires high complexity decision making for assessment and support, frequent evaluation and titration of therapies, application of advanced monitoring technologies and extensive interpretation of multiple databases. Critical Care Time devoted to patient care services described in this note independent of APP/resident time (if applicable)  is 32 minutes.   Sherrilyn Rist MD [facility] Personal pager: See Amion If unanswered, please page CCM On-call: [phone]

## 2022-07-17 NOTE — Progress Notes (Signed)
Updated patients daughters and spouse at bedside  She is weaning well and we will give her a chance off the ventilator  If unable to protect her airway, will need reintubation and a tracheostomy moving forward   Orders placed for extubation when family ready

## 2022-07-17 NOTE — Progress Notes (Signed)
  Transition of Care [facility]) Screening Note   Patient Details  Name: Gina Kelley Date of Birth: 1935/07/02   Transition of Care [facility]) CM/SW Contact:    Benard Halsted, LCSW Phone Number: 07/17/2022, 4:37 PM    Transition of Care Department [facility]) has reviewed patient. We will continue to monitor patient advancement through interdisciplinary progression rounds. If new patient transition needs arise, please place a TOC consult.

## 2022-07-17 NOTE — Progress Notes (Signed)
Nutrition Follow-up  DOCUMENTATION CODES:   Non-severe (moderate) malnutrition in context of chronic illness  INTERVENTION:   Tube Feeding via Cortrak:  Glucerna 1.5 at 45 ml/hr This provides 1620 kcals, 89 g of protein and 821 mL of free water  Free water flushes of 200 mL q 6 hours to provide total of 1621 mL   NUTRITION DIAGNOSIS:   Moderate Malnutrition related to chronic illness as evidenced by moderate fat depletion, moderate muscle depletion.  Being addressed via TF   GOAL:   Patient will meet greater than or equal to 90% of their needs  Progressing  MONITOR:   Vent status, TF tolerance, Labs  REASON FOR ASSESSMENT:   Consult Enteral/tube feeding initiation and management  ASSESSMENT:   Pt with PMH of DM, HTN, HLD, CKD admitted with ICH with SDH s/p craniotomy.  Pt discussed during ICU rounds and with RN.  Per MD plan for possible extubation after talking with the family.   1/09  s/p R crani and biopsy of suspicious tissue worrisome for metastatic disease per MD  1/15 Extubated, re-intubated later in the day; s/p cortrak placement, per xray tip in distal gastric antrum  Medications reviewed and include: SSI, semglee 15 units daily, protonix, miralax, senokot-s  Labs: AST: 133, ALT: 93  UOP: 325 ml, x 1   Weight:  Admission: 55.8 kg Current: 53.3 kg   Diet Order:   Diet Order             Diet NPO time specified  Diet effective now                   EDUCATION NEEDS:   Not appropriate for education at this time  Skin:  Skin Assessment: Reviewed RN Assessment  Last BM:  1/17  Height:   Ht Readings from Last 1 Encounters:  07/08/22 5\' 3"  (1.6 m)    Weight:   Wt Readings from Last 1 Encounters:  07/17/22 53.3 kg    BMI:  Body mass index is 20.82 kg/m.  Estimated Nutritional Needs:   Kcal:  1450-1650  Protein:  75-90 grams  Fluid:  > 1.5 L/day  Lockie Pares., RD, LDN, CNSC See AMiON for contact information

## 2022-07-17 NOTE — Progress Notes (Signed)
Patient extubated  Within 5 minutes started struggling, not able to protect airway  Placed on BiPAP  Was called to bedside and decision was made to reintubate  Patient will need a tracheostomy  Will update family

## 2022-07-18 ENCOUNTER — Encounter: Payer: Medicare HMO | Admitting: Internal Medicine

## 2022-07-18 DIAGNOSIS — Z66 Do not resuscitate: Secondary | ICD-10-CM | POA: Diagnosis not present

## 2022-07-18 DIAGNOSIS — Z515 Encounter for palliative care: Secondary | ICD-10-CM

## 2022-07-18 DIAGNOSIS — J9601 Acute respiratory failure with hypoxia: Secondary | ICD-10-CM | POA: Diagnosis not present

## 2022-07-18 DIAGNOSIS — Z7189 Other specified counseling: Secondary | ICD-10-CM | POA: Diagnosis not present

## 2022-07-18 LAB — CBC WITH DIFFERENTIAL/PLATELET
Abs Immature Granulocytes: 0.08 10*3/uL — ABNORMAL HIGH (ref 0.00–0.07)
Basophils Absolute: 0 10*3/uL (ref 0.0–0.1)
Basophils Relative: 0 %
Eosinophils Absolute: 0.2 10*3/uL (ref 0.0–0.5)
Eosinophils Relative: 2 %
HCT: 23.8 % — ABNORMAL LOW (ref 36.0–46.0)
Hemoglobin: 7.6 g/dL — ABNORMAL LOW (ref 12.0–15.0)
Immature Granulocytes: 1 %
Lymphocytes Relative: 6 %
Lymphs Abs: 0.8 10*3/uL (ref 0.7–4.0)
MCH: 29.2 pg (ref 26.0–34.0)
MCHC: 31.9 g/dL (ref 30.0–36.0)
MCV: 91.5 fL (ref 80.0–100.0)
Monocytes Absolute: 0.9 10*3/uL (ref 0.1–1.0)
Monocytes Relative: 7 %
Neutro Abs: 11 10*3/uL — ABNORMAL HIGH (ref 1.7–7.7)
Neutrophils Relative %: 84 %
Platelets: 288 10*3/uL (ref 150–400)
RBC: 2.6 MIL/uL — ABNORMAL LOW (ref 3.87–5.11)
RDW: 16.8 % — ABNORMAL HIGH (ref 11.5–15.5)
WBC: 13.1 10*3/uL — ABNORMAL HIGH (ref 4.0–10.5)
nRBC: 0 % (ref 0.0–0.2)

## 2022-07-18 LAB — GLUCOSE, CAPILLARY
Glucose-Capillary: 155 mg/dL — ABNORMAL HIGH (ref 70–99)
Glucose-Capillary: 162 mg/dL — ABNORMAL HIGH (ref 70–99)
Glucose-Capillary: 150 mg/dL — ABNORMAL HIGH (ref 70–99)
Glucose-Capillary: 144 mg/dL — ABNORMAL HIGH (ref 70–99)
Glucose-Capillary: 157 mg/dL — ABNORMAL HIGH (ref 70–99)

## 2022-07-18 LAB — BASIC METABOLIC PANEL
Anion gap: 8 (ref 5–15)
BUN: 37 mg/dL — ABNORMAL HIGH (ref 8–23)
CO2: 30 mmol/L (ref 22–32)
Calcium: 8.3 mg/dL — ABNORMAL LOW (ref 8.9–10.3)
Chloride: 105 mmol/L (ref 98–111)
Creatinine, Ser: 0.78 mg/dL (ref 0.44–1.00)
GFR, Estimated: >60 mL/min (ref >60)
Glucose, Bld: 173 mg/dL — ABNORMAL HIGH (ref 70–99)
Potassium: 4.3 mmol/L (ref 3.5–5.1)
Sodium: 143 mmol/L (ref 135–145)

## 2022-07-18 NOTE — Progress Notes (Signed)
   Providing Compassionate, Quality Care - Together   Subjective: Patient is intubated.  Objective: Vital signs in last 24 hours: Temp:  [99.2 F (37.3 C)-100.2 F (37.9 C)] 100.2 F (37.9 C) (01/19 0800) Pulse Rate:  [70-102] 76 (01/19 0900) Resp:  [0-28] 0 (01/19 0900) BP: (100-146)/(51-86) 128/54 (01/19 0900) SpO2:  [94 %-100 %] 100 % (01/19 0900) FiO2 (%):  [30 %-100 %] 40 % (01/19 0836) Weight:  [56.2 kg] 56.2 kg (01/19 0210)  Intake/Output from previous day: 01/18 0701 - 01/19 0700 In: 1244.8 [NG/GT:1045; IV Piggyback:199.8] Out: 530 [Urine:530] Intake/Output this shift: Total I/O In: 190 [NG/GT:90; IV Piggyback:100] Out: -   Intubated PERRLA, Will open eyes with repetitive prompting. Will track and blink to confrontation Follows commands RUE and RLE Flickers to pain LUE, subtle purposeful movement LLE Incision closed with staples. Site is clean, dry, and intact  Lab Results: Recent Labs    07/17/22 0457 07/17/22 1551 07/18/22 0446  WBC 12.9*  --  13.1*  HGB 8.6* 7.5* 7.6*  HCT 27.4* 22.0* 23.8*  PLT 257  --  288   BMET Recent Labs    07/17/22 0457 07/17/22 1551 07/18/22 0446  NA 144 146* 143  K 4.3 4.1 4.3  CL 104  --  105  CO2 29  --  30  GLUCOSE 163*  --  173*  BUN 39*  --  37*  CREATININE 0.76  --  0.78  CALCIUM 8.7*  --  8.3*    Studies/Results: No results found.  Assessment/Plan: Patient is ten days status post craniotomy for evacuation of SDH and ICH by Dr. Annette Stable. Her neuro exam continues to improve. Pathology negative for malignant process. Specimen consistent with thrombus/hematoma. Patient failed extubation 07/14/2022. CCM trialed extubation on 07/17/2022, but patient required reintubation. Patient's family would like to move forward with trach. Palliative care is now involved. Continue supportive efforts. Appreciate CCM's participation in ventilator and medical management for this patient.    LOS: 10 days   -No new Neurosurgical  recommendations. -Continue supportive efforts.   [name], DNP, AGNP-C Nurse Practitioner  [facility] [address], [place], [place], [place] [address] P: [phone]    F: [phone]  07/18/2022, 11:00 AM

## 2022-07-18 NOTE — Plan of Care (Signed)
  Problem: Education: Goal: Knowledge of disease or condition will improve Outcome: Progressing Goal: Knowledge of secondary prevention will improve (MUST DOCUMENT ALL) Outcome: Progressing Goal: Knowledge of patient specific risk factors will improve Gina Kelley N/A or DELETE if not current risk factor) Outcome: Progressing   Problem: Intracerebral Hemorrhage Tissue Perfusion: Goal: Complications of Intracerebral Hemorrhage will be minimized Outcome: Progressing   Problem: Coping: Goal: Will verbalize positive feelings about self Outcome: Progressing Goal: Will identify appropriate support needs Outcome: Progressing   Problem: Health Behavior/Discharge Planning: Goal: Ability to manage health-related needs will improve Outcome: Progressing Goal: Goals will be collaboratively established with patient/family Outcome: Progressing   Problem: Self-Care: Goal: Ability to participate in self-care as condition permits will improve Outcome: Progressing Goal: Verbalization of feelings and concerns over difficulty with self-care will improve Outcome: Progressing Goal: Ability to communicate needs accurately will improve Outcome: Progressing   Problem: Education: Goal: Knowledge of the prescribed therapeutic regimen will improve Outcome: Progressing   Problem: Clinical Measurements: Goal: Usual level of consciousness will be regained or maintained. Outcome: Progressing Goal: Neurologic status will improve Outcome: Progressing Goal: Ability to maintain intracranial pressure will improve Outcome: Progressing   Problem: Health Behavior/Discharge Planning: Goal: Ability to manage health-related needs will improve Outcome: Progressing   Problem: Clinical Measurements: Goal: Ability to maintain clinical measurements within normal limits will improve Outcome: Progressing Goal: Will remain free from infection Outcome: Progressing Goal: Diagnostic test results will improve Outcome:  Progressing Goal: Respiratory complications will improve Outcome: Progressing Goal: Cardiovascular complication will be avoided Outcome: Progressing

## 2022-07-18 NOTE — Progress Notes (Signed)
  Interdisciplinary Goals of Care Family Meeting   Date carried out:: 07/18/2022  Location of the meeting: Conference room  Member's involved: Physician, Bedside Registered Kelley, Family Member or next of kin, and Palliative care team member  Durable Power of Attorney or acting medical decision maker: Gina Kelley daughter, husband  Discussion: We discussed goals of care for Gina Kelley with her extended family including her daughter, husband led by [employer]. In setting of her advanced age, prolonged course of mechanical ventilation, rapid decline post extubation, we worry that even with tracheostomy her recovery would be at best prolonged and rocky with a profound loss of independence. Unclear how long she would require ventilator but doubt we would be able to quickly wean support given her course here. After some discussion decision made to make DNR. Likely plan for one-way extubation and probably with comfort measures only.   Code status: Full DNR  Disposition: Continue current acute care   Time spent for the meeting: 15 minutes  Gina Kelley 07/18/2022, 1:24 PM

## 2022-07-18 NOTE — Progress Notes (Signed)
NAME:  Gina Kelley, MRN:  [id number], DOB:  Oct 20, 1935, LOS: 74 ADMISSION DATE:  07/08/2022 CONSULTATION DATE:  07/09/2022 REFERRING MD:  Annette Stable - NSGY CHIEF COMPLAINT:  Vent management s/p crani for IPH   History of Present Illness:  87 year old woman who presented to [name] ED via EMS 1/9 as a Code Stroke. LKW 1/9 1300. Found by family with L-sided weakness, R gaze deviation. PMHx significant for HTN, HLD, CVA (1998 with residual LLE numbness), T2DM, osteopenia.  On ED arrival, patient had persistent L-sided weakness and R gaze deviation as well as confusion and aphasia; she was hypertensive to SBP 200s. CT Head demonstrated right IPH with 87mm midline shift and right frontoparietal SDH. Neuro consulted with plan for aggressive BP control, Cleviprex and Labetalol started. Patient was intubated in the ED for airway protection in the setting of worsening GCS. NSGY consulted. Concern for possible malignancy/intrinsic brain tumor given irregularity around hemorrhage on CT and vasogenic edema.  Patient was taken to OR 1/9 for R craniotomy/SDH evacuation and possible biopsy versus resection. Intraoperative course was unremarkable; biopsy of suspicious tissue sent to pathology. Postoperative repeat CT Head 1/10 demonstrated resolution of SDH post-crani and some residual hemorrhage without evidence of mass effect.   PCCM consulted for vent management.  Pertinent Medical History:   Past Medical History:  Diagnosis Date   Anemia of chronic disease 07/08/2006   Antral ulcer 02/23/2007   Seen on EGD in 2008, small ulcer with erosion    Cerebral vascular accident ([place]) 04/14/2006   1998, left lower extremity numbness, no residual deficits    Essential hypertension 04/14/2006   Gastroesophageal reflux disease 02/18/2013   Occasional, symptomatically relieved with peptobismol    Hyperlipidemia LDL goal < 100 04/14/2006   Hypertensive retinopathy of both eyes, grade 1 05/15/2016   Osteopenia of right  femoral neck 10/20/2016   DEXA (10/16/2016): R femur T -2.5 (FRAX tool calculates at -2.4), L1-L4 spine T -0.9, 10 year risk for: Major osteoporotic fracture 8.3%, Hip fracture 2.7%   Type 2 diabetes mellitus with stage 1 chronic kidney disease, without long-term current use of insulin ([place])    Type II diabetes mellitus ([place]) 04/14/2006   Significant Hospital Events: Including procedures, antibiotic start and stop dates in addition to other pertinent events   1/9 - Presented via EMS as Code Stroke. CT Head with R IPH/SDH, vasogenic edema c/f ?malignant process. Cleviprex for BP control. Intubated for airway protection. Taken to OR for R crani/SDH evacuation. 1/10 - Repeat CT Head with resolution of SDH, some residual hemorrhage, no mass effect. PCCM consulted for assistance with vent management. 1/12 off sedation and still remains encephalopathic, CT head with Similar size of residual intraparenchymal hemorrhage centered at the right frontal operculum, but with mildly increased localized edema. Mass effect with approximately 5 mm of right-to-left shift, mildly increased from prior. No hydrocephalus or trapping. EEG negative. Increased prominence of small focal hypodensity involving the right basal ganglia, suspicious for an evolving ischemic infarct. 4. Abnormal vasogenic edema involving the anterior right frontal lobe, again concerning for underlying tumor/mass 1/13: started on hypertonic. Tolerating PS for 10 hours 1/15: Extubated, reintubated about 3 hours afterwards for increased work of breathing, sonorous breathing 1/18 extubated and reintubated plan for trach  Interim History / Subjective:   Extubated and reintubated 1/18, plan tentatively for trach  No overnight events  Objective:  Blood pressure (!) 128/54, pulse 76, temperature 100.2 F (37.9 C), temperature source Axillary, resp. rate (!) 0, height 5'  3" (1.6 m), weight 56.2 kg, SpO2 100 %.    Vent Mode: PSV;CPAP FiO2 (%):  [30 %-100  %] 40 % Set Rate:  [16 bmp] 16 bmp Vt Set:  [410 mL] 410 mL PEEP:  [5 cmH20] 5 cmH20 Pressure Support:  [5 cmH20-10 cmH20] 10 cmH20 Plateau Pressure:  [13 cmH20-15 cmH20] 13 cmH20   Intake/Output Summary (Last 24 hours) at 07/18/2022 1037 Last data filed at 07/18/2022 0900 Gross per 24 hour  Intake 1199.84 ml  Output 350 ml  Net 849.84 ml    Filed Weights   07/16/22 0500 07/17/22 0420 07/18/22 0210  Weight: 55.4 kg 53.3 kg 56.2 kg    General appearance: 87 y.o., female, thin chronically ill appearing Eyes: anicteric sclerae; PERRL, tracking appropriately HENT: NCAT; MMM Lungs: mech breath sounds bl, equal chest rise CV: RRR, no murmur  Abdomen: Soft, non-tender; non-distended, BS present  Extremities: trace peripheral edema, warm Neuro: follows commands to wiggle toes, show r thumb   WBC 13 Hb 7.6  CXR nodular opacity RLL, ?developing retrocardiac opacity   Resolved Hospital Problem List:  Hypokalemia CKD stage 1  Assessment & Plan:   Right frontotemporal intraparenchymal hemorrhage Right subdural hematoma s/p evacuation Vasogenic edema with brain compression Right basal ganglia infarct -Code stroke 1/9 -OR 1/9 for craniotomy/subdural hemorrhage evacuation and biopsy -Right basal ganglia infarct which could be contributing to clinical symptoms at present P: Neuro following Maintain neuro protective measures; goal for eurothermia, euglycemia, eunatermia, normoxia, and PCO2 goal of 35-40 Nutrition and bowel regiment  Seizure precautions  AEDs per neurology  Aspiration precautions   Acute hypoxic/hypercapnic respiratory failure -Failed extubation 07/14/2022, was retaining CO2 and increased work of breathing -Again extubated 1/18 and failed quickly, she was reintubated. P: Family meeting today with PMT to discuss tentative plan for trach Continue ventilator support with lung protective strategies  Head of bed elevated 30 degrees. Ensure adequate pulmonary  hygiene  VAP bundle in place  PAD protocol  Hypertension Hyperlipidemia Prior CVA in 1998 P: Continue Cozaar and amlodipine  SBP goal less than 160 Continue Lipitor and Zetia Continuous telemetry   Type 2 diabetes P: CBG checks q4 SSI Continue Semglee  CBG goal 140-180  Prognosis remains guarded  Best Practice: (right click and "Reselect all SmartList Selections" daily)   Diet/type: Continue tube feeds DVT prophylaxis: SCDs, no AC in the setting of IPH GI prophylaxis: PPI Lines: D/C Art Line  Foley:  External foley cath  Code Status:  full code Last date of multidisciplinary goals of care discussion: Per Primary Team - NSGY   Critical care:    This patient is critically ill with multiple organ system failure; which, requires frequent high complexity decision making, assessment, support, evaluation, and titration of therapies. This was completed through the application of advanced monitoring technologies and extensive interpretation of multiple databases. During this encounter critical care time was devoted to patient care services described in this note for 45 minutes.  [place]  Personal contact information can be found on Amion  If no contact or response made please call [phone] 07/18/2022, 10:49 AM

## 2022-07-18 NOTE — Consult Note (Addendum)
Palliative Medicine Inpatient Consult Note  Consulting Provider: Tomma Lightning, MD   Reason for consult:   Palliative Care Consult Services Palliative Medicine Consult  Reason for Consult? Goals of care discussions   Patient with subdural hematoma that was evacuated, intraparenchymal hemorrhage.  Weans well on the vent, and has been extubated twice and reintubated.  Will need a trach, assistance with navigating goals of care   07/18/2022  HPI:  Per intake H&P --> 87 year old woman PMHx significant for HTN, HLD, CVA (1998 with residual LLE numbness), T2DM, osteopenia. Presented to [facility] ED via EMS 1/9 as a Code Stroke. CT Head demonstrated right IPH with 75mm midline shift and right frontoparietal SDH. Taken to OR 1/9 for R craniotomy/SDH evacuation. Palliative care has been asked to get involved to further support goals of care conversations given multiple failed extubations.   Clinical Assessment/Goals of Care:  *Please note that this is a verbal dictation therefore any spelling or grammatical errors are due to the "[employer] One" system interpretation.  I have reviewed medical records including EPIC notes, labs and imaging, received report from bedside RN, assessed the patient who is lying in bed not responsive.    I met with patient's husband, daughters Lendon Collar, and son Molly Maduro to further discuss diagnosis prognosis, [name], EOL wishes, disposition and options.   I introduced Palliative Medicine as specialized medical care for people living with serious illness. It focuses on providing relief from the symptoms and stress of a serious illness. The goal is to improve quality of life for both the patient and the family.  Medical History Review and Understanding:  A review of Gina Kelley's past medical history inclusive of her high blood pressure, prior CVA, type 2 diabetes for which she was not on any agents, and osteopenia was held.  Social History:  Gina Kelley is from [place]  originally.  Gina Kelley has been married to her husband for the last 61 years.  Gina Kelley has 4 children and 19 grandchildren.  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  She is a woman of faith and practices within the [employer] [employer] [facility] [employer] [employer] denomination.  Functional and Nutritional State:  Prior to hospitalization Gina Kelley was fully functional and identified as a very spunky woman able to do all her B ADLs and IADLs without assistance.  Gina Kelley was still driving preceding hospital admission.  Advance Directives:  A detailed discussion was had today regarding advanced directives.  Patient's spouse and 4 children are her surrogate decision makers.  Code Status:  Concepts specific to code status, artifical feeding and hydration, continued IV antibiotics and rehospitalization was had.  The difference between a aggressive medical intervention path  and a palliative comfort care path for this patient at this time was had.   Encouraged patient/family to consider DNAR status understanding evidenced based poor outcomes in similar hospitalized patient, as the cause of arrest is likely associated with advanced chronic/terminal illness rather than an easily reversible acute cardio-pulmonary event. I explained that DNAR does not change the medical plan and it only comes into effect after a person has arrested (died).  It is a protective measure to keep [place] from harming the patient in their last moments of life.  Patient's family were agreeable to DNAR with understanding that patient would not receive CPR, defibrillation, ACLS medications, or intubation.   Discussion:  A discussion regarding patient's possible long-term outlook was held with great sincerity.  We discussed best case and worst-case scenarios  in [place] clinical situation.  We reviewed that Gina Kelley has endured a large bleed of her brain and even though it has been evacuated there are likely going to be long-term effects therefore the amount  of recovery she will make is unknown.  I shared that it is encouraging that Gina Kelley had a good degree of function prior to this event though she does have chronic comorbid conditions and she is older which place her at high risk for meaningful recovery.  Patient's family were all very tearful but thankful for this information.  We discussed the difficulty associated with muscular deconditioning after an event and prolonged hospitalization.  We reviewed the idea of tracheostomy both in the short and long-term.  We discussed the idea of patient going to a long-term acute care hospital after tracheostomy is placed whereby they will continue the current level of care and offer support such as physical and occupational therapies.  I shared the risk of those facilities being that [facility] gets additional infections of her lungs, urinary tract, or succumbs to pressure injuries of her skin.  We reviewed in addition to a tracheostomy as an intervention moving forward patient would also require a gastrostomy tube.  We discussed that this is something which can be complicated and include multiple side effects inclusive of pain, diarrhea, dislodgment.  Patient's family have a strong faith and believes that God will be the one to decide her outcome.  We reviewed the different outcomes whether we choose to continue along this more aggressive mediated path and the alternative of traveling down a more comfort oriented path of care.  We reviewed if comfort care is elected then we would offer medications to aggressively manage symptoms and enable patient is not in pain or suffering.  We would remove her endotracheal tube and allow at that time nature to take its course without any extraneous heroic measures.  Patient's family were able to absorb all of this information and have requested more time to consider the possible direction they would like to go.  They do seem to recognize that she is very sick at this time acknowledging  that she may not be strong enough for recovery.  We have determined that additional decisions will be made on Sunday once patient's brother, Jori Moll is able to come, has a chance to see his mother and speak to his family members.   Discussed the importance of continued conversation with family and their  medical providers regarding overall plan of care and treatment options, ensuring decisions are within the context of the patients values and GOCs.  Decision Maker: Patient's husband and 4 children make decisions jointly  SUMMARY OF RECOMMENDATIONS   DNAR  Open and honest conversations were held in the setting of patient's present clinical state and long-term outlook  This case and worst-case scenarios were reviewed  Plan for family to speak amongst themselves to further determine what plan of care they would like to pursue whether that be tracheostomy and LTAC versus comfort care  Plan for additional conversations to ensue on Sunday  Ongoing palliative support  Code Status/Advance Care Planning: DNAR   Palliative Prophylaxis:  Aspiration, Bowel Regimen, Delirium Protocol, Frequent Pain Assessment, Oral Care, Palliative Wound Care, and Turn Reposition  Additional Recommendations (Limitations, Scope, Preferences): Continue current care  Psycho-social/Spiritual:  Desire for further Chaplaincy support: Patient's pastor will be coming in Additional Recommendations: Reviewed best case worst-case scenarios in the setting of patient's acute illness   Prognosis: Limited given patient's chronic disease  burden and acute illness in the setting of hemorrhagic stroke  Discharge Planning: Discharge plan is uncertain at this time  Vitals:   07/18/22 0500 07/18/22 0600  BP: (!) 129/56 (!) 141/59  Pulse: 79 90  Resp: 17 (!) 0  Temp:    SpO2: 100% 100%    Intake/Output Summary (Last 24 hours) at 07/18/2022 4268 Last data filed at 07/18/2022 0600 Gross per 24 hour  Intake 1199.84 ml   Output 530 ml  Net 669.84 ml   Last Weight  Most recent update: 07/18/2022  2:12 AM    Weight  56.2 kg (123 lb 14.4 oz)            Gen: Chronically ill-appearing African-American female  HEENT: ETT, core track, dry mucous membranes CV: Regular rate and rhythm PULM: On mechanical ventilator ABD: soft/nontender/nondistended EXT: Generalized edema Neuro: Somnolent nonresponsive  PPS: 10%   This conversation/these recommendations were discussed with patient primary care team, Dr. Verlee Monte  Total Time: 76 Billing based on MDM: High ______________________________________________________ [place] Team Team Cell Phone: [phone] Please utilize secure chat with additional questions, if there is no response within 30 minutes please call the above phone number  Palliative Medicine Team providers are available by phone from 7am to 7pm daily and can be reached through the team cell phone.  Should this patient require assistance outside of these hours, please call the patient's attending physician.

## 2022-07-18 NOTE — Plan of Care (Signed)
Patient received on [facility] settings as charted. Placed on weaning settings PSV 10/5 this morning and has tolerated this mode well. Patient remains on these settings at this time.

## 2022-07-19 DIAGNOSIS — Z9911 Dependence on respirator [ventilator] status: Secondary | ICD-10-CM

## 2022-07-19 DIAGNOSIS — Z515 Encounter for palliative care: Secondary | ICD-10-CM | POA: Diagnosis not present

## 2022-07-19 DIAGNOSIS — Z7189 Other specified counseling: Secondary | ICD-10-CM | POA: Diagnosis not present

## 2022-07-19 DIAGNOSIS — J9601 Acute respiratory failure with hypoxia: Secondary | ICD-10-CM | POA: Diagnosis not present

## 2022-07-19 LAB — BASIC METABOLIC PANEL
Anion gap: 8 (ref 5–15)
BUN: 35 mg/dL — ABNORMAL HIGH (ref 8–23)
CO2: 31 mmol/L (ref 22–32)
Calcium: 8.6 mg/dL — ABNORMAL LOW (ref 8.9–10.3)
Chloride: 102 mmol/L (ref 98–111)
Creatinine, Ser: 0.79 mg/dL (ref 0.44–1.00)
GFR, Estimated: >60 mL/min (ref >60)
Glucose, Bld: 186 mg/dL — ABNORMAL HIGH (ref 70–99)
Potassium: 4.1 mmol/L (ref 3.5–5.1)
Sodium: 141 mmol/L (ref 135–145)

## 2022-07-19 LAB — GLUCOSE, CAPILLARY
Glucose-Capillary: 130 mg/dL — ABNORMAL HIGH (ref 70–99)
Glucose-Capillary: 136 mg/dL — ABNORMAL HIGH (ref 70–99)
Glucose-Capillary: 139 mg/dL — ABNORMAL HIGH (ref 70–99)
Glucose-Capillary: 144 mg/dL — ABNORMAL HIGH (ref 70–99)
Glucose-Capillary: 155 mg/dL — ABNORMAL HIGH (ref 70–99)
Glucose-Capillary: 157 mg/dL — ABNORMAL HIGH (ref 70–99)
Glucose-Capillary: 159 mg/dL — ABNORMAL HIGH (ref 70–99)

## 2022-07-19 LAB — CBC
HCT: 25 % — ABNORMAL LOW (ref 36.0–46.0)
Hemoglobin: 8 g/dL — ABNORMAL LOW (ref 12.0–15.0)
MCH: 29.3 pg (ref 26.0–34.0)
MCHC: 32 g/dL (ref 30.0–36.0)
MCV: 91.6 fL (ref 80.0–100.0)
Platelets: 315 10*3/uL (ref 150–400)
RBC: 2.73 MIL/uL — ABNORMAL LOW (ref 3.87–5.11)
RDW: 16.6 % — ABNORMAL HIGH (ref 11.5–15.5)
WBC: 14.8 10*3/uL — ABNORMAL HIGH (ref 4.0–10.5)
nRBC: 0 % (ref 0.0–0.2)

## 2022-07-19 LAB — MRSA NEXT GEN BY PCR, NASAL: MRSA by PCR Next Gen: NOT DETECTED

## 2022-07-19 MED ORDER — PIPERACILLIN-TAZOBACTAM 3.375 G IVPB
3.3750 g | Freq: Three times a day (TID) | INTRAVENOUS | Status: DC
  Administered 2022-07-19 – 2022-07-22 (×9): 3.375 g via INTRAVENOUS
  Filled 2022-07-19 (×8): qty 50

## 2022-07-19 NOTE — Progress Notes (Signed)
   Palliative Medicine Inpatient Follow Up Note HPI: 87 year old woman PMHx significant for HTN, HLD, CVA (1998 with residual LLE numbness), T2DM, osteopenia. Presented to [facility] ED via EMS 1/9 as a Code Stroke. CT Head demonstrated right IPH with 84mm midline shift and right frontoparietal SDH. Taken to OR 1/9 for R craniotomy/SDH evacuation. Palliative care has been asked to get involved to further support goals of care conversations given multiple failed extubations.    Today's Discussion 07/19/2022  *Please note that this is a verbal dictation therefore any spelling or grammatical errors are due to the "[place] One" system interpretation.  Chart reviewed inclusive of vital signs, progress notes, laboratory results, and diagnostic images.   I met with Marcille "Pam" at bedside this morning. She would not arouse though did wiggle her toes. She was not identified to be on any sedation medications nor was she noted to be in any identified distress. She remains intubated and is receiving artificial nutrition  Per patients RN, [name] patient is able to follow some commands on the right side.  I have called and spoken to patients daughter, [name] this morning. She expressed that her brother, "Lesly Rubenstein" [name] will be coming tomorrow around 4PM. She shares that family is agreeable to having another meeting at that time for further conversation regarding which care direction to travel.   Questions and concerns addressed/Palliative Support Provided.   Objective Assessment: Vital Signs Vitals:   07/19/22 0800 07/19/22 0805  BP: 133/60   Pulse: 85   Resp:    Temp: 99.7 F (37.6 C)   SpO2: 99% 100%    Intake/Output Summary (Last 24 hours) at 07/19/2022 1015 Last data filed at 07/19/2022 0800 Gross per 24 hour  Intake 1535 ml  Output 825 ml  Net 710 ml   Last Weight  Most recent update: 07/19/2022  6:35 AM    Weight  57.1 kg (125 lb 14.1 oz)            Gen: Chronically ill-appearing  African-American female  HEENT: ETT, core track, dry mucous membranes CV: Regular rate and rhythm PULM: On mechanical ventilator ABD: soft/nontender/nondistended EXT: Generalized edema Neuro: Somnolent nonresponsive  SUMMARY OF RECOMMENDATIONS   DNAR   Plan for family to speak amongst themselves to further determine what plan of care they would like to pursue whether that be tracheostomy and LTAC versus comfort care   Plan for additional conversations to ensue on Sunday in early evening   Ongoing palliative support  Billing based on MDM: Moderate ______________________________________________________________________________________ [place] Team Team Cell Phone: [phone] Please utilize secure chat with additional questions, if there is no response within 30 minutes please call the above phone number  Palliative Medicine Team providers are available by phone from 7am to 7pm daily and can be reached through the team cell phone.  Should this patient require assistance outside of these hours, please call the patient's attending physician.

## 2022-07-19 NOTE — Progress Notes (Signed)
NAME:  Gina Kelley, MRN:  [id number], DOB:  1936-06-13, LOS: 4 ADMISSION DATE:  07/08/2022 CONSULTATION DATE:  07/09/2022 REFERRING MD:  Annette Stable - NSGY CHIEF COMPLAINT:  Vent management s/p crani for IPH   History of Present Illness:  87 year old woman who presented to [facility] ED via EMS 1/9 as a Code Stroke. LKW 1/9 1300. Found by family with L-sided weakness, R gaze deviation. PMHx significant for HTN, HLD, CVA (1998 with residual LLE numbness), T2DM, osteopenia.  On ED arrival, patient had persistent L-sided weakness and R gaze deviation as well as confusion and aphasia; she was hypertensive to SBP 200s. CT Head demonstrated right IPH with 66mm midline shift and right frontoparietal SDH. Neuro consulted with plan for aggressive BP control, Cleviprex and Labetalol started. Patient was intubated in the ED for airway protection in the setting of worsening GCS. NSGY consulted. Concern for possible malignancy/intrinsic brain tumor given irregularity around hemorrhage on CT and vasogenic edema.  Patient was taken to OR 1/9 for R craniotomy/SDH evacuation and possible biopsy versus resection. Intraoperative course was unremarkable; biopsy of suspicious tissue sent to pathology. Postoperative repeat CT Head 1/10 demonstrated resolution of SDH post-crani and some residual hemorrhage without evidence of mass effect.   PCCM consulted for vent management.  Pertinent Medical History:   Past Medical History:  Diagnosis Date   Anemia of chronic disease 07/08/2006   Antral ulcer 02/23/2007   Seen on EGD in 2008, small ulcer with erosion    Cerebral vascular accident ([place]) 04/14/2006   1998, left lower extremity numbness, no residual deficits    Essential hypertension 04/14/2006   Gastroesophageal reflux disease 02/18/2013   Occasional, symptomatically relieved with peptobismol    Hyperlipidemia LDL goal < 100 04/14/2006   Hypertensive retinopathy of both eyes, grade 1 05/15/2016   Osteopenia of right  femoral neck 10/20/2016   DEXA (10/16/2016): R femur T -2.5 (FRAX tool calculates at -2.4), L1-L4 spine T -0.9, 10 year risk for: Major osteoporotic fracture 8.3%, Hip fracture 2.7%   Type 2 diabetes mellitus with stage 1 chronic kidney disease, without long-term current use of insulin ([place])    Type II diabetes mellitus ([place]) 04/14/2006   Significant Hospital Events: Including procedures, antibiotic start and stop dates in addition to other pertinent events   1/9 - Presented via EMS as Code Stroke. CT Head with R IPH/SDH, vasogenic edema c/f ?malignant process. Cleviprex for BP control. Intubated for airway protection. Taken to OR for R crani/SDH evacuation. 1/10 - Repeat CT Head with resolution of SDH, some residual hemorrhage, no mass effect. PCCM consulted for assistance with vent management. 1/12 off sedation and still remains encephalopathic, CT head with Similar size of residual intraparenchymal hemorrhage centered at the right frontal operculum, but with mildly increased localized edema. Mass effect with approximately 5 mm of right-to-left shift, mildly increased from prior. No hydrocephalus or trapping. EEG negative. Increased prominence of small focal hypodensity involving the right basal ganglia, suspicious for an evolving ischemic infarct. 4. Abnormal vasogenic edema involving the anterior right frontal lobe, again concerning for underlying tumor/mass 1/13: started on hypertonic. Tolerating PS for 10 hours 1/15: Extubated, reintubated about 3 hours afterwards for increased work of breathing, sonorous breathing 1/18 extubated and reintubated plan for trach  Interim History / Subjective:   [name] discussion yesterday, code status updated to DNR. Possible one way extubation vs comfort measures only but awaiting brother Ron's arrival on Sunday for this discussion.  Objective:  Blood pressure 133/60, pulse 85, temperature  25 F (37.2 C), temperature source Oral, resp. rate 16, height 5\' 3"  (1.6  m), weight 57.1 kg, SpO2 100 %.    Vent Mode: PSV;CPAP FiO2 (%):  [30 %-40 %] 30 % Set Rate:  [16 bmp] 16 bmp Vt Set:  [410 mL] 410 mL PEEP:  [5 cmH20] 5 cmH20 Pressure Support:  [8 cmH20-10 cmH20] 8 cmH20 Plateau Pressure:  [11 cmH20-15 cmH20] 15 cmH20   Intake/Output Summary (Last 24 hours) at 07/19/2022 0843 Last data filed at 07/19/2022 0800 Gross per 24 hour  Intake 1725 ml  Output 825 ml  Net 900 ml   Filed Weights   07/17/22 0420 07/18/22 0210 07/19/22 0600  Weight: 53.3 kg 56.2 kg 57.1 kg    General appearance: 87 y.o., female, thin chronically ill appearing Eyes: tracks to right with eyelids raised HENT: NCAT; MMM Lungs: mech breath sounds bl, equal chest rise CV: RRR, no murmur  Abdomen: Soft, non-tender; non-distended, BS present  Extremities: trace peripheral edema, warm Neuro: follows commands to wiggle toes    BMP, cbc pending  No new imaging   Resolved Hospital Problem List:  Hypokalemia CKD stage 1  Assessment & Plan:   Right frontotemporal intraparenchymal hemorrhage Right subdural hematoma s/p evacuation Vasogenic edema with brain compression Right basal ganglia infarct -Code stroke 1/9 -OR 1/9 for craniotomy/subdural hemorrhage evacuation and biopsy -Right basal ganglia infarct which could be contributing to clinical symptoms at present P: Maintain neuro protective measures; goal for eurothermia, euglycemia, eunatermia, normoxia, and PCO2 goal of 35-40 Nutrition and bowel regiment  Seizure precautions  Aspiration precautions   Acute hypoxic/hypercapnic respiratory failure -Failed extubation 07/14/2022, was retaining CO2 and increased work of breathing -Again extubated 1/18 and failed quickly, she was reintubated. P: Continue ventilator support with lung protective strategy  Head of bed elevated 30 degrees. Ensure adequate pulmonary hygiene  VAP bundle in place  PAD protocol  Hypertension Hyperlipidemia Prior CVA in  1998 P: Continue Cozaar and amlodipine  SBP goal less than 160 Continue Lipitor and Zetia Continuous telemetry   Type 2 diabetes P: CBG checks q4 SSI Continue Semglee  CBG goal 140-180  Prognosis remains guarded  Best Practice: (right click and "Reselect all SmartList Selections" daily)   Diet/type: Continue tube feeds DVT prophylaxis: SCDs, no AC in the setting of IPH GI prophylaxis: PPI Lines: none Foley:  External foley cath  Code Status:  DNR Last date of multidisciplinary goals of care discussion: 1/19 see IPAL note and PMT note, code status updated to DNR. Likely plan for one way extubation vs comfort measures only but awaiting brother Ron's arrival on 1/21 for this discussion. Per Primary Team - NSGY   Critical care:    This patient is critically ill with multiple organ system failure; which, requires frequent high complexity decision making, assessment, support, evaluation, and titration of therapies. This was completed through the application of advanced monitoring technologies and extensive interpretation of multiple databases. During this encounter critical care time was devoted to patient care services described in this note for 36 minutes.  [place]  Personal contact information can be found on Amion  If no contact or response made please call [phone] 07/19/2022, 8:43 AM

## 2022-07-19 NOTE — Progress Notes (Signed)
  NEUROSURGERY PROGRESS NOTE   No issues overnight.   EXAM:  BP 133/60 (BP Location: Right Arm)   Pulse 85   Temp 99.7 F (37.6 C) (Axillary)   Resp 16   Ht 5\' 3"  (1.6 m)   Wt 57.1 kg   SpO2 100%   BMI 22.30 kg/m   Somnolent, arouses to stimulation Follows commands on right Minimal movement LUE/LLE Wound c/d/I, healing well  IMPRESSION:  87 y.o. female POD#11 s/p right crani for SDH/IPH. Remains stable from neurosurgical standpoint  PLAN: - Cont supportive care for now.  - Likely change goals of care to palliative, possible one-way extubation   Consuella Lose, MD [facility]

## 2022-07-19 NOTE — Progress Notes (Signed)
Pharmacy Antibiotic Note  Gina Kelley is a 87 y.o. female admitted on 07/08/2022 with  IPH/SDH s/p crani .  Pharmacy has been consulted for Zosyn dosing for aspiration pneumonia. She is afebrile, WBC are up to 14.8, and renal function is stable.  Plan: Zosyn 3.375 g IV q8h to be infused over 4 hours Monitor renal function, clinical progress F/U LOT and de-escalate as able   Height: 5\' 3"  (160 cm) Weight: 57.1 kg (125 lb 14.1 oz) IBW/kg (Calculated) : 52.4  Temp (24hrs), Avg:99.5 F (37.5 C), Min:99 F (37.2 C), Max:99.8 F (37.7 C)  Recent Labs  Lab 07/14/22 0402 07/15/22 0425 07/17/22 0457 07/18/22 0446 07/19/22 0936  WBC 10.4 12.2* 12.9* 13.1* 14.8*  CREATININE 0.60 0.67 0.76 0.78 0.79    Estimated Creatinine Clearance: 43.3 mL/min (by C-G formula based on SCr of 0.79 mg/dL).    Allergies  Allergen Reactions   Enalapril Cough    Thank you for involving pharmacy in this patient's care.  Renold Genta, PharmD, BCPS Clinical Pharmacist Clinical phone for 07/19/2022 is [phone] 07/19/2022 12:41 PM

## 2022-07-19 NOTE — Plan of Care (Signed)
Patient received on [facility] settings as charted. Placed on PSV 8/5 this morning and is tolerating this well. Patient remains on these settings at this time.

## 2022-07-20 DIAGNOSIS — J9601 Acute respiratory failure with hypoxia: Secondary | ICD-10-CM | POA: Diagnosis not present

## 2022-07-20 DIAGNOSIS — Z515 Encounter for palliative care: Secondary | ICD-10-CM | POA: Diagnosis not present

## 2022-07-20 DIAGNOSIS — Z7189 Other specified counseling: Secondary | ICD-10-CM | POA: Diagnosis not present

## 2022-07-20 LAB — GLUCOSE, CAPILLARY
Glucose-Capillary: 101 mg/dL — ABNORMAL HIGH (ref 70–99)
Glucose-Capillary: 135 mg/dL — ABNORMAL HIGH (ref 70–99)
Glucose-Capillary: 151 mg/dL — ABNORMAL HIGH (ref 70–99)
Glucose-Capillary: 157 mg/dL — ABNORMAL HIGH (ref 70–99)
Glucose-Capillary: 168 mg/dL — ABNORMAL HIGH (ref 70–99)
Glucose-Capillary: 177 mg/dL — ABNORMAL HIGH (ref 70–99)

## 2022-07-20 MED ORDER — DEXAMETHASONE SODIUM PHOSPHATE 4 MG/ML IJ SOLN
4.0000 mg | Freq: Once | INTRAMUSCULAR | Status: AC
  Administered 2022-07-20: 4 mg via INTRAVENOUS
  Filled 2022-07-20: qty 1

## 2022-07-20 NOTE — Progress Notes (Signed)
NAME:  Gina Kelley, MRN:  [id number], DOB:  1935-09-06, LOS: 39 ADMISSION DATE:  07/08/2022 CONSULTATION DATE:  07/09/2022 REFERRING MD:  Annette Stable - NSGY CHIEF COMPLAINT:  Vent management s/p crani for IPH   History of Present Illness:  87 year old woman who presented to [facility] ED via EMS 1/9 as a Code Stroke. LKW 1/9 1300. Found by family with L-sided weakness, R gaze deviation. PMHx significant for HTN, HLD, CVA (1998 with residual LLE numbness), T2DM, osteopenia.  On ED arrival, patient had persistent L-sided weakness and R gaze deviation as well as confusion and aphasia; she was hypertensive to SBP 200s. CT Head demonstrated right IPH with 67mm midline shift and right frontoparietal SDH. Neuro consulted with plan for aggressive BP control, Cleviprex and Labetalol started. Patient was intubated in the ED for airway protection in the setting of worsening GCS. NSGY consulted. Concern for possible malignancy/intrinsic brain tumor given irregularity around hemorrhage on CT and vasogenic edema.  Patient was taken to OR 1/9 for R craniotomy/SDH evacuation and possible biopsy versus resection. Intraoperative course was unremarkable; biopsy of suspicious tissue sent to pathology. Postoperative repeat CT Head 1/10 demonstrated resolution of SDH post-crani and some residual hemorrhage without evidence of mass effect.   PCCM consulted for vent management.  Pertinent Medical History:   Past Medical History:  Diagnosis Date   Anemia of chronic disease 07/08/2006   Antral ulcer 02/23/2007   Seen on EGD in 2008, small ulcer with erosion    Cerebral vascular accident ([place]) 04/14/2006   1998, left lower extremity numbness, no residual deficits    Essential hypertension 04/14/2006   Gastroesophageal reflux disease 02/18/2013   Occasional, symptomatically relieved with peptobismol    Hyperlipidemia LDL goal < 100 04/14/2006   Hypertensive retinopathy of both eyes, grade 1 05/15/2016   Osteopenia of right  femoral neck 10/20/2016   DEXA (10/16/2016): R femur T -2.5 (FRAX tool calculates at -2.4), L1-L4 spine T -0.9, 10 year risk for: Major osteoporotic fracture 8.3%, Hip fracture 2.7%   Type 2 diabetes mellitus with stage 1 chronic kidney disease, without long-term current use of insulin ([place])    Type II diabetes mellitus ([place]) 04/14/2006   Significant Hospital Events: Including procedures, antibiotic start and stop dates in addition to other pertinent events   1/9 - Presented via EMS as Code Stroke. CT Head with R IPH/SDH, vasogenic edema c/f ?malignant process. Cleviprex for BP control. Intubated for airway protection. Taken to OR for R crani/SDH evacuation. 1/10 - Repeat CT Head with resolution of SDH, some residual hemorrhage, no mass effect. PCCM consulted for assistance with vent management. 1/12 off sedation and still remains encephalopathic, CT head with Similar size of residual intraparenchymal hemorrhage centered at the right frontal operculum, but with mildly increased localized edema. Mass effect with approximately 5 mm of right-to-left shift, mildly increased from prior. No hydrocephalus or trapping. EEG negative. Increased prominence of small focal hypodensity involving the right basal ganglia, suspicious for an evolving ischemic infarct. 4. Abnormal vasogenic edema involving the anterior right frontal lobe, again concerning for underlying tumor/mass 1/13: started on hypertonic. Tolerating PS for 10 hours 1/15: Extubated, reintubated about 3 hours afterwards for increased work of breathing, sonorous breathing 1/18 extubated and reintubated plan for trach 1/20 Tolerated PS. Palliative following and considering one way extubation  Interim History / Subjective:    Palliative following. Considering one way extubation. Waiting on family member Ron to arrive today  Objective:  Blood pressure (!) 118/57, pulse 67, temperature  62 F (36.7 C), temperature source Axillary, resp. rate 17, height  5\' 3"  (1.6 m), weight 57.1 kg, SpO2 100 %.    Vent Mode: PRVC FiO2 (%):  [30 %] 30 % Set Rate:  [16 bmp] 16 bmp Vt Set:  [410 mL] 410 mL PEEP:  [5 cmH20] 5 cmH20 Pressure Support:  [8 cmH20] 8 cmH20 Plateau Pressure:  [12 cmH20] 12 cmH20   Intake/Output Summary (Last 24 hours) at 07/20/2022 0737 Last data filed at 07/20/2022 0700 Gross per 24 hour  Intake 1998.4 ml  Output 550 ml  Net 1448.4 ml   Filed Weights   07/18/22 0210 07/19/22 0600 07/20/22 0500  Weight: 56.2 kg 57.1 kg 57.1 kg   Physical Exam: General: Elderly, critically illappearing, no acute distress HENT: [name], AT, ETT in place, tongue enlargment Eyes: EOMI, no scleral icterus Respiratory: Clear to auscultation bilaterally.  No crackles, wheezing or rales Cardiovascular: RRR, -M/R/G, no JVD GI: BS+, soft, nontender Extremities:-Edema,-tenderness Neuro: Eyes closed, follows commands on right side  1/20 WBC 14, slightly increasing Hg 8 stable  No new imaging   Resolved Hospital Problem List:  Hypokalemia CKD stage 1  Assessment & Plan:   Right frontotemporal intraparenchymal hemorrhage Right subdural hematoma s/p evacuation Vasogenic edema with brain compression Right basal ganglia infarct -Code stroke 1/9 -OR 1/9 for craniotomy/subdural hemorrhage evacuation and biopsy -Right basal ganglia infarct which could be contributing to clinical symptoms at present P: Maintain neuro protective measures; goal for eurothermia, euglycemia, eunatermia, normoxia, and PCO2 goal of 35-40 Nutrition and bowel regiment  Seizure precautions - Keppra Aspiration precautions   Acute hypoxic/hypercapnic respiratory failure -Failed extubation 07/14/2022, was retaining CO2 and increased work of breathing -Again extubated 1/18 and failed quickly, she was reintubated. P: Continue ventilator support with lung protective strategy Wean PS as tolerated. Discussing [name] today when family arrives  Head of bed elevated 30  degrees. Ensure adequate pulmonary hygiene  VAP bundle in place  PAD protocol Zosyn started 1/20>  Hypertension Hyperlipidemia Prior CVA in 1998 P: Continue Cozaar and amlodipine  SBP goal less than 160 Continue Lipitor and Zetia Continuous telemetry   Type 2 diabetes P: CBG checks q4 SSI Continue Semglee  CBG goal 140-180  Prognosis remains guarded  Best Practice: (right click and "Reselect all SmartList Selections" daily)   Diet/type: Continue tube feeds DVT prophylaxis: SCDs, no AC in the setting of IPH GI prophylaxis: PPI Lines: none Foley:  External foley cath  Code Status:  DNR Last date of multidisciplinary goals of care discussion: 1/19 see IPAL note and PMT note, code status updated to DNR. Likely plan for one way extubation vs comfort measures only but awaiting brother Ron's arrival on 1/21 for this discussion. Per Primary Team - NSGY. Palliative following   Critical care:     The patient is critically ill with multiple organ systems failure and requires high complexity decision making for assessment and support, frequent evaluation and titration of therapies, application of advanced monitoring technologies and extensive interpretation of multiple databases.  Independent Critical Care Time: 40 Minutes.   Rodman Pickle, M.D. [facility] Pulmonary/Critical Care Medicine 07/20/2022 7:37 AM   Please see Amion for pager number to reach on-call Pulmonary and Critical Care Team.

## 2022-07-20 NOTE — Progress Notes (Signed)
  NEUROSURGERY PROGRESS NOTE   No issues overnight.   EXAM:  BP (!) 132/58 (BP Location: Right Arm)   Pulse 79   Temp 98.8 F (37.1 C) (Axillary)   Resp 17   Ht 5\' 3"  (1.6 m)   Wt 57.1 kg   SpO2 100%   BMI 22.30 kg/m   Somnolent, arouses to stimulation Follows commands on right Minimal movement LUE/LLE Wound c/d/I, healing well  IMPRESSION:  87 y.o. female POD#12 s/p right crani for SDH/IPH. Remains stable from neurosurgical standpoint with VDRF.  PLAN: - Cont supportive care for now, remains DRN. - Likely change goals of care to palliative, possible one-way extubation after family arrives later this pm.   Consuella Lose, MD [facility]

## 2022-07-20 NOTE — Progress Notes (Addendum)
Palliative Medicine Inpatient Follow Up Note HPI: 87 year old woman PMHx significant for HTN, HLD, CVA (1998 with residual LLE numbness), T2DM, osteopenia. Presented to [facility] via EMS 1/9 as a Code Stroke. CT Head demonstrated right IPH with 29mm midline shift and right frontoparietal SDH. Taken to OR 1/9 for R craniotomy/SDH evacuation. Palliative care has been asked to get involved to further support goals of care conversations given multiple failed extubations.    Today's Discussion 07/20/2022  *Please note that this is a verbal dictation therefore any spelling or grammatical errors are due to the "[place] [employer]" system interpretation.  Chart reviewed inclusive of vital signs, progress notes, laboratory results, and diagnostic images.   I met with Gina "Pam" at bedside this morning. She remains to follow commands on her right side consistently. She still does not open her eyes.   A family meeting was held this evening with patients husband, daughters [name],  son [name], granddaughter, [name], SIL [name], grandson, [name], DIL [name], and SIL [name].   Both [name], RN and myself were present for conversations.   A discussion related to patients present clinical condition was held.   Reviewed the trauma endured to patients brain s/p stroke and craniotomy. Discussed how such pressure on the brain resulting in a midline shift can cause long lasting effects even after relief of the pressure. Reviewed the reasons why patient continues to endure struggles when taken off of the ventilator. Discusser her re-intubations.  We discussed the avenues which can be traveled one of aggressive care continuing present measures, having a trach placed and being afflicted by nosocomial infections and pressure injuries. We discussed the idea of comfort care which would enable the medical team to stop pursuing heroics/artificial support and focus on dignity and quality at the end of  life. Reviewed the ways which we would do that inclusive of family presence, medications, and additional therapeutic measures.    Patients family recognize that she is able to follow some directions and do think she is mentally there. They are consistent with understanding that she would never desire living in an [facility] or being dependent on life supportive measures.   We reviewed that [name] is a wonderful woman and has brought multiple people to a place of faith. Discussed that they believe God will guide her to where she is meant to be.   Patients family needed some time to pray to determine the best course of care. They have determined that tomorrow they would like to pursue comfort care.   Questions and concerns addressed/Palliative Support Provided.   Objective Assessment: Vital Signs Vitals:   07/20/22 1300 07/20/22 1400  BP: (!) 117/49 (!) 106/49  Pulse: 75 79  Resp:    Temp:    SpO2: 99% 100%    Intake/Output Summary (Last 24 hours) at 07/20/2022 1534 Last data filed at 07/20/2022 1400 Gross per 24 hour  Intake 2074.65 ml  Output 550 ml  Net 1524.65 ml    Last Weight  Most recent update: 07/20/2022  5:36 AM    Weight  57.1 kg (125 lb 14.1 oz)            Gen: Chronically ill-appearing African-American female  HEENT: ETT, core track, dry mucous membranes CV: Regular rate and rhythm PULM: On mechanical ventilator ABD: soft/nontender/nondistended EXT: Generalized edema Neuro: Responds with right hand  SUMMARY OF RECOMMENDATIONS   DNAR  Family meeting held formal discussion of patients clinical condition to date was held  Reviewed the route of aggressive care versus comfort mediated care   Plan for family to pursue comfort care tomorrow --> Patients sister, [name] will be called at 10AM for further delineation of time for compassionate extubation   Ongoing palliative support  Total Time: 117 Billing based on MDM:  High ______________________________________________________________________________________ [place] Team Team Cell Phone: [phone] Please utilize secure chat with additional questions, if there is no response within 30 minutes please call the above phone number  Palliative Medicine Team providers are available by phone from 7am to 7pm daily and can be reached through the team cell phone.  Should this patient require assistance outside of these hours, please call the patient's attending physician.

## 2022-07-21 DIAGNOSIS — I618 Other nontraumatic intracerebral hemorrhage: Secondary | ICD-10-CM

## 2022-07-21 DIAGNOSIS — J9602 Acute respiratory failure with hypercapnia: Secondary | ICD-10-CM

## 2022-07-21 DIAGNOSIS — Z9911 Dependence on respirator [ventilator] status: Secondary | ICD-10-CM | POA: Diagnosis not present

## 2022-07-21 DIAGNOSIS — Z7189 Other specified counseling: Secondary | ICD-10-CM | POA: Diagnosis not present

## 2022-07-21 DIAGNOSIS — Z9889 Other specified postprocedural states: Secondary | ICD-10-CM | POA: Diagnosis not present

## 2022-07-21 DIAGNOSIS — Z515 Encounter for palliative care: Secondary | ICD-10-CM | POA: Diagnosis not present

## 2022-07-21 DIAGNOSIS — I6200 Nontraumatic subdural hemorrhage, unspecified: Secondary | ICD-10-CM

## 2022-07-21 LAB — CULTURE, RESPIRATORY W GRAM STAIN

## 2022-07-21 LAB — GLUCOSE, CAPILLARY
Glucose-Capillary: 235 mg/dL — ABNORMAL HIGH (ref 70–99)
Glucose-Capillary: 196 mg/dL — ABNORMAL HIGH (ref 70–99)
Glucose-Capillary: 149 mg/dL — ABNORMAL HIGH (ref 70–99)
Glucose-Capillary: 63 mg/dL — ABNORMAL LOW (ref 70–99)
Glucose-Capillary: 63 mg/dL — ABNORMAL LOW (ref 70–99)
Glucose-Capillary: 134 mg/dL — ABNORMAL HIGH (ref 70–99)
Glucose-Capillary: 147 mg/dL — ABNORMAL HIGH (ref 70–99)

## 2022-07-21 MED ORDER — DEXTROSE 50 % IV SOLN
INTRAVENOUS | Status: AC
  Administered 2022-07-21: 50 mL
  Filled 2022-07-21: qty 50

## 2022-07-21 MED ORDER — GLYCOPYRROLATE 0.2 MG/ML IJ SOLN
0.4000 mg | INTRAMUSCULAR | Status: DC
  Administered 2022-07-21 – 2022-08-02 (×73): 0.4 mg via INTRAVENOUS
  Filled 2022-07-21 (×67): qty 2

## 2022-07-21 MED ORDER — DEXTROSE 50 % IV SOLN
INTRAVENOUS | Status: AC
  Filled 2022-07-21: qty 50

## 2022-07-21 NOTE — Progress Notes (Signed)
NAME:  Gina Kelley, MRN:  [id number], DOB:  1935-08-07, LOS: 13 ADMISSION DATE:  07/08/2022 CONSULTATION DATE:  07/09/2022 REFERRING MD:  Jordan Likes - NSGY CHIEF COMPLAINT:  Vent management s/p crani for IPH   History of Present Illness:  87 year old woman who presented to [facility] ED via EMS 1/9 as a Code Stroke. LKW 1/9 1300. Found by family with L-sided weakness, R gaze deviation. PMHx significant for HTN, HLD, CVA (1998 with residual LLE numbness), T2DM, osteopenia.  On ED arrival, patient had persistent L-sided weakness and R gaze deviation as well as confusion and aphasia; she was hypertensive to SBP 200s. CT Head demonstrated right IPH with 83mm midline shift and right frontoparietal SDH. Neuro consulted with plan for aggressive BP control, Cleviprex and Labetalol started. Patient was intubated in the ED for airway protection in the setting of worsening GCS. NSGY consulted. Concern for possible malignancy/intrinsic brain tumor given irregularity around hemorrhage on CT and vasogenic edema.  Patient was taken to OR 1/9 for R craniotomy/SDH evacuation and possible biopsy versus resection. Intraoperative course was unremarkable; biopsy of suspicious tissue sent to pathology. Postoperative repeat CT Head 1/10 demonstrated resolution of SDH post-crani and some residual hemorrhage without evidence of mass effect.   PCCM consulted for vent management.  Pertinent Medical History:   Past Medical History:  Diagnosis Date   Anemia of chronic disease 07/08/2006   Antral ulcer 02/23/2007   Seen on EGD in 2008, small ulcer with erosion    Cerebral vascular accident (HCC) 04/14/2006   1998, left lower extremity numbness, no residual deficits    Essential hypertension 04/14/2006   Gastroesophageal reflux disease 02/18/2013   Occasional, symptomatically relieved with peptobismol    Hyperlipidemia LDL goal < 100 04/14/2006   Hypertensive retinopathy of both eyes, grade 1 05/15/2016   Osteopenia of right  femoral neck 10/20/2016   DEXA (10/16/2016): R femur T -2.5 (FRAX tool calculates at -2.4), L1-L4 spine T -0.9, 10 year risk for: Major osteoporotic fracture 8.3%, Hip fracture 2.7%   Type 2 diabetes mellitus with stage 1 chronic kidney disease, without long-term current use of insulin (HCC)    Type II diabetes mellitus (HCC) 04/14/2006   Significant Hospital Events: Including procedures, antibiotic start and stop dates in addition to other pertinent events   1/9 - Presented via EMS as Code Stroke. CT Head with R IPH/SDH, vasogenic edema c/f ?malignant process. Cleviprex for BP control. Intubated for airway protection. Taken to OR for R crani/SDH evacuation. 1/10 - Repeat CT Head with resolution of SDH, some residual hemorrhage, no mass effect. PCCM consulted for assistance with vent management. 1/12 off sedation and still remains encephalopathic, CT head with Similar size of residual intraparenchymal hemorrhage centered at the right frontal operculum, but with mildly increased localized edema. Mass effect with approximately 5 mm of right-to-left shift, mildly increased from prior. No hydrocephalus or trapping. EEG negative. Increased prominence of small focal hypodensity involving the right basal ganglia, suspicious for an evolving ischemic infarct. 4. Abnormal vasogenic edema involving the anterior right frontal lobe, again concerning for underlying tumor/mass 1/13: started on hypertonic. Tolerating PS for 10 hours 1/15: Extubated, reintubated about 3 hours afterwards for increased work of breathing, sonorous breathing 1/18 extubated and reintubated plan for trach 1/20 Tolerated PS. Palliative following and considering one way extubation 1/22 Family plans for comfort care today about 10 am  Interim History / Subjective:  Did well overnight, currently on 30% and PS of 5 Awake and following commands Family plan  to initiate comfort care 1/22   Objective:  Blood pressure (!) 111/48, pulse 61,  temperature (!) 97.4 F (36.3 C), temperature source Axillary, resp. rate 17, height 5\' 3"  (1.6 m), weight 57.1 kg, SpO2 100 %.    Vent Mode: PRVC FiO2 (%):  [30 %-40 %] 40 % Set Rate:  [16 bmp] 16 bmp Vt Set:  [410 mL] 410 mL PEEP:  [5 cmH20] 5 cmH20 Pressure Support:  [8 cmH20] 8 cmH20 Plateau Pressure:  [12 cmH20-13 cmH20] 13 cmH20   Intake/Output Summary (Last 24 hours) at 07/21/2022 0809 Last data filed at 07/21/2022 0600 Gross per 24 hour  Intake 1472.52 ml  Output 900 ml  Net 572.52 ml   Filed Weights   07/18/22 0210 07/19/22 0600 07/20/22 0500  Weight: 56.2 kg 57.1 kg 57.1 kg   Physical Exam: General: Elderly, critically illappearing, no acute distress HENT: [place], AT, ETT in place, tongue enlargment, No LAD.craniotomy scar healing Eyes: EOMI, no scleral icterus Respiratory: Clear to auscultation bilaterally.  No crackles, wheezing or rales, no accessory muscle use noted Cardiovascular: RRR, -M/R/G, no JVD, SR per tele GI: BS+, soft, non tender, TF have been paused in anticipation of extubation  Extremities:-Edema,-tenderness Neuro: Eyes open , follows commands on right side, gave thumbs up to question   1/22 No new labs No new imaging   Resolved Hospital Problem List:  Hypokalemia CKD stage 1  Assessment & Plan:   Right frontotemporal intraparenchymal hemorrhage Right subdural hematoma s/p evacuation Vasogenic edema with brain compression Right basal ganglia infarct -Code stroke 1/9 -OR 1/9 for craniotomy/subdural hemorrhage evacuation and biopsy -Right basal ganglia infarct which could be contributing to clinical symptoms at present P: Maintain neuro protective measures; goal for eurothermia, euglycemia, eunatermia, normoxia, and PCO2 goal of 35-40 Nutrition and bowel regiment  Seizure precautions - Keppra Aspiration precautions   Acute hypoxic/hypercapnic respiratory failure -Failed extubation 07/14/2022, was retaining CO2 and increased work of  breathing -Again extubated 1/18 and failed quickly, she was reintubated. P: Continue ventilator support with lung protective strategy Wean PS as tolerated. >> plan for comfort care transition 1/22 Head of bed elevated 30 degrees. Ensure adequate pulmonary hygiene  VAP bundle in place  PAD protocol Zosyn started 1/20>  Hypertension Hyperlipidemia Prior CVA in 1998 P: Continue Cozaar and amlodipine  SBP goal less than 160 Continue Lipitor and Zetia Continuous telemetry   Type 2 diabetes P: CBG checks q4 SSI Continue Semglee  CBG goal 140-180  Urinary retention Has been I&O cathed x 2 Plan  Bladder scan prn Place Foley per protocol   Plan for comfort care per family on 1/22  Best Practice: (right click and "Reselect all SmartList Selections" daily)   Diet/type: Continue tube feeds DVT prophylaxis: SCDs, no AC in the setting of IPH GI prophylaxis: PPI Lines: none Foley:   Code Status:  DNR Last date of multidisciplinary goals of care discussion: 1/19 see IPAL note and PMT note, code status updated to DNR. Likely plan for one way extubation vs comfort measures only but awaiting brother Ron's arrival on 1/21 for this discussion. Per Primary Team - NSGY. Palliative following   Critical care:     The patient is critically ill with multiple organ systems failure and requires high complexity decision making for assessment and support, frequent evaluation and titration of therapies, application of advanced monitoring technologies and extensive interpretation of multiple databases.  Independent Critical Care Time: 30 Minutes.   Howard Pouch, MSN, [place]  07/21/2022 8:09 AM   Please see Amion for pager number to reach on-call Pulmonary and Critical Care Team.

## 2022-07-21 NOTE — Progress Notes (Signed)
No significant change in status.  Palliative care now involved.  Plan for one-way extubation tomorrow.

## 2022-07-21 NOTE — Progress Notes (Signed)
Palliative:  HPI: 87 year old woman PMHx significant for HTN, HLD, CVA (1998 with residual LLE numbness), T2DM, osteopenia. Presented to [facility] ED via EMS 1/9 as a Code Stroke. CT Head demonstrated right IPH with 65mm midline shift and right frontoparietal SDH. Taken to OR 1/9 for R craniotomy/SDH evacuation. Palliative care has been asked to get involved to further support goals of care conversations given multiple failed extubations.     I met today at Gina Kelley's bedside with daughter, Gina Kelley. She is unsure of the timing of happenings or plans for today. Gina Kelley is resting comfortably and tolerating weaning mode well. Adding robinul for secretions with plans to pursue one way extubation.   I called and spoke with daughter, Gina Kelley, as directed by my colleague. When I spoke with Gina Kelley she voiced understanding of the plan to pursue extubation and comfort but is awaiting for when this is to occur. I recommended that we plan for all family that desires to be here be present at time of extubation. I explained that I recommend that we move forward with extubation today and at the latest tomorrow if needed for all family to gather. Gina Kelley agrees with plan. She will reach out to her family and let me know a time that they will be able to gather. I will await to hear back from family on timing to proceed with comfort.   Update: I spoke again with Gina Kelley. Gina Kelley shares that family will be able to gather at bedside tomorrow 1/23 ~11-12 to extubate. Gina Kelley further voices her faith and trust in God to do what is best for her mother. She is hopeful that she can do better than she has previously when extubated but also accepting if this is not God's will for her mother.   Exam: Elderly, ill-appearing frail. No distress. Vent to ETT tolerating wean mode. No distress. Opens eyes and following some simple commands. Abd flat.   Plan: - One way extubation tomorrow 1/23 ~11am. I will be present to discuss this process and expectations with  family at bedside.   [place], NP Palliative Medicine Team Pager [phone] (Please see [email or web address] for schedule) Team Phone [phone]    Greater than 50%  of this time was spent counseling and coordinating care related to the above assessment and plan

## 2022-07-22 DIAGNOSIS — Z66 Do not resuscitate: Secondary | ICD-10-CM | POA: Diagnosis not present

## 2022-07-22 DIAGNOSIS — I611 Nontraumatic intracerebral hemorrhage in hemisphere, cortical: Secondary | ICD-10-CM | POA: Diagnosis not present

## 2022-07-22 DIAGNOSIS — Z515 Encounter for palliative care: Secondary | ICD-10-CM | POA: Diagnosis not present

## 2022-07-22 DIAGNOSIS — L8990 Pressure ulcer of unspecified site, unspecified stage: Secondary | ICD-10-CM | POA: Insufficient documentation

## 2022-07-22 DIAGNOSIS — Z9889 Other specified postprocedural states: Secondary | ICD-10-CM | POA: Diagnosis not present

## 2022-07-22 DIAGNOSIS — J9601 Acute respiratory failure with hypoxia: Secondary | ICD-10-CM

## 2022-07-22 DIAGNOSIS — Z7189 Other specified counseling: Secondary | ICD-10-CM | POA: Diagnosis not present

## 2022-07-22 LAB — GLUCOSE, CAPILLARY
Glucose-Capillary: 104 mg/dL — ABNORMAL HIGH (ref 70–99)
Glucose-Capillary: 107 mg/dL — ABNORMAL HIGH (ref 70–99)
Glucose-Capillary: 118 mg/dL — ABNORMAL HIGH (ref 70–99)
Glucose-Capillary: 120 mg/dL — ABNORMAL HIGH (ref 70–99)
Glucose-Capillary: 128 mg/dL — ABNORMAL HIGH (ref 70–99)
Glucose-Capillary: 143 mg/dL — ABNORMAL HIGH (ref 70–99)

## 2022-07-22 MED ORDER — MORPHINE 100MG IN NS 100ML (1MG/ML) PREMIX INFUSION
2.0000 mg/h | INTRAVENOUS | Status: DC
  Filled 2022-07-22: qty 100

## 2022-07-22 MED ORDER — MORPHINE SULFATE (PF) 2 MG/ML IV SOLN
2.0000 mg | INTRAVENOUS | Status: DC | PRN
  Filled 2022-07-22: qty 1

## 2022-07-22 MED ORDER — SODIUM CHLORIDE 0.9 % IV SOLN
2.0000 g | Freq: Two times a day (BID) | INTRAVENOUS | Status: DC
  Administered 2022-07-22 – 2022-07-29 (×14): 2 g via INTRAVENOUS
  Filled 2022-07-22 (×16): qty 2

## 2022-07-22 MED ORDER — ORAL CARE MOUTH RINSE: 15.0000 mL | OROMUCOSAL | Status: DC | PRN

## 2022-07-22 MED ORDER — ORAL CARE MOUTH RINSE
15.0000 mL | OROMUCOSAL | Status: DC
  Administered 2022-07-22 – 2022-07-30 (×32): 15 mL via OROMUCOSAL

## 2022-07-22 MED ORDER — MORPHINE BOLUS VIA INFUSION: 2.0000 mg | INTRAVENOUS | Status: DC | PRN

## 2022-07-22 NOTE — Progress Notes (Signed)
NAME:  Gina Kelley, MRN:  [id number], DOB:  01-03-1936, LOS: 68 ADMISSION DATE:  07/08/2022 CONSULTATION DATE:  07/09/2022 REFERRING MD:  Annette Stable - NSGY CHIEF COMPLAINT:  Vent management s/p crani for IPH   History of Present Illness:  87 year old woman who presented to [facility] ED via EMS 1/9 as a Code Stroke. LKW 1/9 1300. Found by family with L-sided weakness, R gaze deviation. PMHx significant for HTN, HLD, CVA (1998 with residual LLE numbness), T2DM, osteopenia.  On ED arrival, patient had persistent L-sided weakness and R gaze deviation as well as confusion and aphasia; she was hypertensive to SBP 200s. CT Head demonstrated right IPH with 110mm midline shift and right frontoparietal SDH. Neuro consulted with plan for aggressive BP control, Cleviprex and Labetalol started. Patient was intubated in the ED for airway protection in the setting of worsening GCS. NSGY consulted. Concern for possible malignancy/intrinsic brain tumor given irregularity around hemorrhage on CT and vasogenic edema.  Patient was taken to OR 1/9 for R craniotomy/SDH evacuation and possible biopsy versus resection. Intraoperative course was unremarkable; biopsy of suspicious tissue sent to pathology. Postoperative repeat CT Head 1/10 demonstrated resolution of SDH post-crani and some residual hemorrhage without evidence of mass effect.   PCCM consulted for vent management.  Pertinent Medical History:   Past Medical History:  Diagnosis Date   Anemia of chronic disease 07/08/2006   Antral ulcer 02/23/2007   Seen on EGD in 2008, small ulcer with erosion    Cerebral vascular accident ([place]) 04/14/2006   1998, left lower extremity numbness, no residual deficits    Essential hypertension 04/14/2006   Gastroesophageal reflux disease 02/18/2013   Occasional, symptomatically relieved with peptobismol    Hyperlipidemia LDL goal < 100 04/14/2006   Hypertensive retinopathy of both eyes, grade 1 05/15/2016   Osteopenia of right  femoral neck 10/20/2016   DEXA (10/16/2016): R femur T -2.5 (FRAX tool calculates at -2.4), L1-L4 spine T -0.9, 10 year risk for: Major osteoporotic fracture 8.3%, Hip fracture 2.7%   Type 2 diabetes mellitus with stage 1 chronic kidney disease, without long-term current use of insulin ([place])    Type II diabetes mellitus ([place]) 04/14/2006   Significant Hospital Events: Including procedures, antibiotic start and stop dates in addition to other pertinent events   1/9 - Presented via EMS as Code Stroke. CT Head with R IPH/SDH, vasogenic edema c/f ?malignant process. Cleviprex for BP control. Intubated for airway protection. Taken to OR for R crani/SDH evacuation. 1/10 - Repeat CT Head with resolution of SDH, some residual hemorrhage, no mass effect. PCCM consulted for assistance with vent management. 1/12 off sedation and still remains encephalopathic, CT head with Similar size of residual intraparenchymal hemorrhage centered at the right frontal operculum, but with mildly increased localized edema. Mass effect with approximately 5 mm of right-to-left shift, mildly increased from prior. No hydrocephalus or trapping. EEG negative. Increased prominence of small focal hypodensity involving the right basal ganglia, suspicious for an evolving ischemic infarct. 4. Abnormal vasogenic edema involving the anterior right frontal lobe, again concerning for underlying tumor/mass 1/13: started on hypertonic. Tolerating PS for 10 hours 1/15: Extubated, reintubated about 3 hours afterwards for increased work of breathing, sonorous breathing 1/18 extubated and reintubated plan for trach 1/20 Tolerated PS. Palliative following and considering one way extubation 1/22 plan for extubation deferred 1/23 1-way extubation planned   Interim History / Subjective:   NAEO  Objective:  Blood pressure (!) 113/58, pulse 77, temperature 98.9 F (37.2 C),  temperature source Oral, resp. rate 11, height 5\' 3"  (1.6 m), weight 57.1 kg,  SpO2 100 %.    Vent Mode: PSV;CPAP FiO2 (%):  [40 %] 40 % Set Rate:  [16 bmp] 16 bmp Vt Set:  [410 mL] 410 mL PEEP:  [5 cmH20] 5 cmH20 Pressure Support:  [5 cmH20-10 cmH20] 10 cmH20 Plateau Pressure:  [9 cmH20-14 cmH20] 9 cmH20   Intake/Output Summary (Last 24 hours) at 07/22/2022 1024 Last data filed at 07/22/2022 0900 Gross per 24 hour  Intake 976.53 ml  Output 875 ml  Net 101.53 ml   Filed Weights   07/18/22 0210 07/19/22 0600 07/20/22 0500  Weight: 56.2 kg 57.1 kg 57.1 kg   Physical Exam:  General: elderly ill appearing F NAD HENT: ETT secure. Pink mmm clear oral secretions  Eyes: Anicteric sclera  Respiratory: Symmetrical chest expansion, mechanically ventilated  Cardiovascular: rr  GI: soft  Extremities: no acute joint deformity  Neuro: Awakens to stimulation following R sided commands   Resolved Hospital Problem List:    Assessment & Plan:   Goals of Care DNR status P: -palliative care is involved with family -plan is 1-way extubation today (1/23)   Acute hypoxic and hypercarbic respiratory failure  -failed extubation twice  P: -plan is one-way extubation 1/23 around 11am  -started zosyn 1/20. If transitions to comfort care after 1-way, will dc. If stabilizes, continue    R IPH R SDH s/p crani evac (1/9) Vasogenic edema "brain compression" R basal ganglia infarct  Hx prior CVA (1998)  P: -NSGY primary appreciate recs -BID keppra   HTN HLD  P: -cozaar amlodipine zetia lipitor  -cardiac monitoring   DM2  P: -semglee 15u qD and q4hr SSI   Urinary retention  P -foley  -will defer adding retention meds as plan is 1way extubation today (1/23). If stabilizes and can swallow, will start      Best Practice: (right click and "Reselect all SmartList Selections" daily)   Diet/type: Continue tube feeds DVT prophylaxis: SCDs, no AC in the setting of IPH GI prophylaxis: PPI Lines: none Foley:  yes  Code Status:  DNR Last date of multidisciplinary  goals of care discussion: 1/22 with palli care -- plan for 1way extubation 1/23    Critical care:     CRITICAL CARE Performed by: Cristal Generous   Total critical care time: 35 minutes  Critical care time was exclusive of separately billable procedures and treating other patients. Critical care was necessary to treat or prevent imminent or life-threatening deterioration.  Critical care was time spent personally by me on the following activities: development of treatment plan with patient and/or surrogate as well as nursing, discussions with consultants, evaluation of patient's response to treatment, examination of patient, obtaining history from patient or surrogate, ordering and performing treatments and interventions, ordering and review of laboratory studies, ordering and review of radiographic studies, pulse oximetry and re-evaluation of patient's condition.  Eliseo Gum MSN, AGACNP-BC [place] for pager  07/22/2022, 10:24 AM

## 2022-07-22 NOTE — Procedures (Signed)
Extubation Procedure Note  Patient Details:   Name: Gina Kelley DOB: Feb 25, 1936 MRN: [id number]   Airway Documentation:  Airway 7.5 mm (Active)  Secured at (cm) 24 cm 07/22/22 0904  Measured From Lips 07/22/22 [place] 07/22/22 0904  Secured By [employer] 07/22/22 0904  Tube Holder Repositioned Yes 07/22/22 0904  Prone position No 07/22/22 0904  Cuff Pressure (cm H2O) Clear OR 27-39 CmH2O 07/22/22 0904  Site Condition Dry 07/22/22 0904   Vent end date: 07/17/22 Vent end time: 1400   Evaluation  O2 sats: stable throughout Complications: No apparent complications Patient did tolerate procedure well. Bilateral Breath Sounds: Clear, Diminished   Yes Pt compassionately extubated to 4lpm [place] at this time.   Folasade Mooty 07/22/2022, 1:29 PM

## 2022-07-22 NOTE — Progress Notes (Signed)
Palliative:  HPI: 87 year old woman PMHx significant for HTN, HLD, CVA (1998 with residual LLE numbness), T2DM, osteopenia. Presented to [facility] ED via EMS 1/9 as a Code Stroke. CT Head demonstrated right IPH with 48mm midline shift and right frontoparietal SDH. Taken to OR 1/9 for R craniotomy/SDH evacuation. Palliative care has been asked to get involved to further support goals of care conversations given multiple failed extubations.      I met today at Gina Kelley's bedside and I spoke with RN and then with family: husband, 2 daughters, and grandson. We discussed goals of care. They are clear in their wishes for Gina Kelley. They are relying on their faith - in which they share with [name]. They are hopeful for a miracle and time with her but are accepting if this is not in God's will for her. They have taken time to evaluate and come to peace with the option of one way extubation. We discussed giving her every opportunity to do as well as possible but if she declines we will be prepared to provide treatment and medication to minimize her suffering. They all agree with this plan. They are at peace with extubating and allowing for God's will to be done. 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   [name] is extubated. She is maintaining respirations. Will continue to monitor for airway protection and she is unable to swallow and maintain hydration/nutrition. Family are thankful for this time with her. Will allow them time to visit and monitor over the next 24 hours. We will continue goals of care tomorrow.   All questions/concerns addressed. Emotional support provided.   Exam: Elderly, ill-appearing frail. No distress. Vent to ETT tolerating wean mode. No distress. Opens eyes and following some simple commands. Abd flat.   Plan: - Extubated. Continue conservative interventions for now. No plans for re-intubation.  - Comfort care if she declines.  - PRN medications added for  comfort if needed. Low threshold for morphine infusion if she declines.  - I will follow up tomorrow.   [place], NP Palliative Medicine Team Pager [phone] (Please see [email or web address] for schedule) Team Phone [phone]    Greater than 50%  of this time was spent counseling and coordinating care related to the above assessment and plan

## 2022-07-23 DIAGNOSIS — I611 Nontraumatic intracerebral hemorrhage in hemisphere, cortical: Secondary | ICD-10-CM | POA: Diagnosis not present

## 2022-07-23 DIAGNOSIS — Z515 Encounter for palliative care: Secondary | ICD-10-CM | POA: Diagnosis not present

## 2022-07-23 DIAGNOSIS — Z7189 Other specified counseling: Secondary | ICD-10-CM | POA: Diagnosis not present

## 2022-07-23 LAB — GLUCOSE, CAPILLARY
Glucose-Capillary: 113 mg/dL — ABNORMAL HIGH (ref 70–99)
Glucose-Capillary: 140 mg/dL — ABNORMAL HIGH (ref 70–99)
Glucose-Capillary: 136 mg/dL — ABNORMAL HIGH (ref 70–99)
Glucose-Capillary: 154 mg/dL — ABNORMAL HIGH (ref 70–99)
Glucose-Capillary: 156 mg/dL — ABNORMAL HIGH (ref 70–99)
Glucose-Capillary: 106 mg/dL — ABNORMAL HIGH (ref 70–99)

## 2022-07-23 NOTE — Progress Notes (Signed)
   Providing Compassionate, Quality Care - Together   Subjective: Family is at the bedside. No new issues. Patient shakes her head "no" when asked if she has any pain.  Objective: Vital signs in last 24 hours: Temp:  [98.1 F (36.7 C)-99.3 F (37.4 C)] 99.3 F (37.4 C) (01/24 1133) Pulse Rate:  [64-88] 87 (01/24 1133) Resp:  [13-20] 20 (01/24 1133) BP: (109-132)/(59-90) 131/65 (01/24 1133) SpO2:  [99 %-100 %] 99 % (01/24 1133)  Intake/Output from previous day: 01/23 0701 - 01/24 0700 In: 939.2 [NG/GT:605; IV Piggyback:334.2] Out: 2505 [Urine:2055; Stool:450] Intake/Output this shift: No intake/output data recorded.  Alert, opens eyes spontaneously PERRLA, right gaze preference Follows commands RUE and RLE Flickers to pain LUE, subtle purposeful movement LLE No respiratory distress, on room air Craniotomy site is clean, dry, and intact  Lab Results: No results for input(s): "WBC", "HGB", "HCT", "PLT" in the last 72 hours. BMET No results for input(s): "NA", "K", "CL", "CO2", "GLUCOSE", "BUN", "CREATININE", "CALCIUM" in the last 72 hours.  Studies/Results: No results found.  Assessment/Plan: Patient is fifteen days status post craniotomy for evacuation of SDH and ICH by Dr. Annette Stable. Her neuro exam continues to improve. Pathology negative for malignant process. Specimen consistent with thrombus/hematoma. Patient failed extubation 07/14/2022. CCM again trialed extubation on 07/17/2022, but patient required reintubation. Palliative care is now involved. Patient was extubated to comfort measures on 07/22/2022.   LOS: 15 days   -Continue supportive efforts -No new Neurosurgical recommendations   [name], DNP, AGNP-C Nurse Practitioner  [facility] [address], [place], [place] [address] P: [phone]    F: [phone]  07/23/2022, 1:27 PM

## 2022-07-23 NOTE — TOC Initial Note (Signed)
Transition of Care (TOC) - Initial/Assessment Note  Marvetta Gibbons RN,BSN Transitions of Care Unit 4NP (Non Trauma)- RN Case Manager See Treatment Team for direct Phone #    Patient Details  Name: Gina Kelley MRN: [id number] Date of Birth: [birth year]  Transition of Care [facility]) CM/SW Contact:    Dawayne Patricia, RN Phone Number: 07/23/2022, 2:16 PM  Clinical Narrative:                 Pt s/p crani for evacuation of SDA and IH, tx from ICU s/p one way extubation to comfort measures on 07/22/22. Noted pt had previous failed extubations x2.  Transition [facility]) has reviewed patient and note PC team following, plan to continue supportive efforts. We will continue to monitor patient advancement through interdisciplinary progression rounds. If new patient transition needs arise, please place a TOC consult.   Expected Discharge Plan: Skilled Nursing Facility Barriers to Discharge: Continued Medical Work up   Patient Goals and CMS Choice    TBD        Expected Discharge Plan and Services       Living arrangements for the past 2 months: Single Family Home                                      Prior Living Arrangements/Services Living arrangements for the past 2 months: Single Family Home Lives with:: Self                   Activities of Daily Living      Permission Sought/Granted                  Emotional Assessment              Admission diagnosis:  Nontraumatic cortical hemorrhage of right cerebral hemisphere [facility]) [I61.1] S/P craniotomy [Z98.890] ICH (intracerebral hemorrhage) ([place]) [I61.9] Patient Active Problem List   Diagnosis Date Noted   Acute respiratory failure with hypoxia ([place]) 07/22/2022   DNR (do not resuscitate) 07/22/2022   Pressure injury of skin 07/22/2022   Ventilator dependence ([place]) 07/11/2022   Malnutrition of moderate degree 07/10/2022   S/P craniotomy 07/08/2022   ICH (intracerebral  hemorrhage) ([place]) 07/08/2022   Need for prophylactic vaccination against Streptococcus pneumoniae (pneumococcus) 11/15/2021   Prolapse of female pelvic organs 05/15/2020   Osteopenia of right femoral neck 10/20/2016   Hypertensive retinopathy of both eyes, grade 1 05/15/2016   Gastroesophageal reflux disease without esophagitis 02/18/2013   Healthcare maintenance 12/04/2011   Anemia in chronic renal disease 07/08/2006   Type 2 diabetes mellitus with stage 1 chronic kidney disease, without long-term current use of insulin ([place]) 04/14/2006   Hyperlipidemia 04/14/2006   Essential hypertension 04/14/2006   PCP:  Sid Falcon, MD Pharmacy:   [place], [place] [address] [place] [address] Phone: [phone] Fax: [phone]     Social Determinants of Health (SDOH) Social History: SDOH Screenings   Depression (PHQ2-9): Low Risk  (11/15/2021)  Tobacco Use: Medium Risk (07/10/2022)   SDOH Interventions:     Readmission Risk Interventions     No data to display

## 2022-07-23 NOTE — Care Management Important Message (Signed)
Important Message  Patient Details  Name: Gina Kelley MRN: [id number] Date of Birth: 1936-05-24   Medicare Important Message Given:  Yes     Hannah Beat 07/23/2022, 10:52 AM

## 2022-07-23 NOTE — Progress Notes (Signed)
Palliative:  HPI: 87 year old woman PMHx significant for HTN, HLD, CVA (1998 with residual LLE numbness), T2DM, osteopenia. Presented to [facility] ED via EMS 1/9 as a Code Stroke. CT Head demonstrated right IPH with 57mm midline shift and right frontoparietal SDH. Taken to OR 1/9 for R craniotomy/SDH evacuation. Palliative care has been asked to get involved to further support goals of care conversations given multiple failed extubations.        I met today at Gina Kelley's bedside. Gina Kelley is understanding of questions answered and nods head or provides hand gestures (waves as a greeting or gives a thumbs up). She denies pain, discomfort, or difficulty breathing. Family are at bedside and are still pleased with Gina Kelley progress and ability to maintain off ventilator and interact with them. Family are very appreciative of the care she has received.   I did not further conversation with family today as they are enjoying this time they are able to have with Gina Kelley. They do allude to eventually allowing her to eat on her own again and progression but I fear that this time is likely limited in this state. We will have to have further discussions with them regarding path forward but they have previously expressed that they would not desire trach or PEG. At this time I will allow them this time to enjoy with her as they are able to have meaningful interactions.   All questions/concerns addressed. Emotional support provided.   Exam: Alert, appropriate responses. No distress. Breathing regular, unlabored. Cortrak in place with feeding. Abd soft, flat. Moves right side to command.   Plan: - Watchful waiting. Continue current care. Will need to ultimately discuss transition to full comfort care vs PEG placement. Time for outcomes.  - I will request my colleague to follow up 07/25/21. Please call for acute palliative needs in the meantime.   [place], NP Palliative Medicine Team Pager [phone]  (Please see [email or web address] for schedule) Team Phone [phone]    Greater than 50%  of this time was spent counseling and coordinating care related to the above assessment and plan

## 2022-07-24 LAB — GLUCOSE, CAPILLARY
Glucose-Capillary: 135 mg/dL — ABNORMAL HIGH (ref 70–99)
Glucose-Capillary: 139 mg/dL — ABNORMAL HIGH (ref 70–99)
Glucose-Capillary: 144 mg/dL — ABNORMAL HIGH (ref 70–99)
Glucose-Capillary: 145 mg/dL — ABNORMAL HIGH (ref 70–99)
Glucose-Capillary: 151 mg/dL — ABNORMAL HIGH (ref 70–99)
Glucose-Capillary: 156 mg/dL — ABNORMAL HIGH (ref 70–99)

## 2022-07-24 LAB — CULTURE, BLOOD (ROUTINE X 2)
Culture: NO GROWTH
Culture: NO GROWTH

## 2022-07-24 NOTE — Progress Notes (Signed)
Nutrition Follow-up  DOCUMENTATION CODES:   Non-severe (moderate) malnutrition in context of chronic illness  INTERVENTION:   Continue tube feeding via Cortrak: - Glucerna 1.5 @ 45 ml/hr (1080 ml/day) - Free water flushes of 200 ml q 6 hours  Tube feeding regimen provides 1620 kcal, 89 grams of protein, and 820 ml of H2O.   Total free water with flushes: 1620 ml  NUTRITION DIAGNOSIS:   Moderate Malnutrition related to chronic illness as evidenced by moderate fat depletion, moderate muscle depletion.  Ongoing, being addressed via TF  GOAL:   Patient will meet greater than or equal to 90% of their needs  Met via TF  MONITOR:   Diet advancement, Labs, Weight trends, TF tolerance, Skin  REASON FOR ASSESSMENT:   Consult Enteral/tube feeding initiation and management  ASSESSMENT:   Pt with PMH of DM, HTN, HLD, CKD admitted with ICH with SDH s/p craniotomy.  1/09  s/p R crani and biopsy of suspicious tissue worrisome for metastatic disease per MD  1/15 Extubated, reintubated later in the day; s/p cortrak placement, per xray tip in distal gastric antrum 01/18 - extubated, almost immediately reintubated 01/23 - one-way extubation  Pt is s/p one-way extubation on 1/23. Per Palliative note, plan is to continue conservative interventions for now with no plans for re-intubation and comfort care if pt declines. Pt remains NPO with tube feeds infusing via Cortrak. Per Palliative note, "will need to ultimately discuss transition to full comfort care vs PEG placement."  Current TF: Glucerna 1.5 @ 45 ml/hr, free water 200 ml q 6 hours  Admit weight: 55.8 kg Current weight: 57.1 kg  Medications reviewed and include: SSI q 4 hours, IV protonix, miralax, senna  Labs reviewed. CBG's: 106-156 x 24 hours  Diet Order:   Diet Order             Diet NPO time specified  Diet effective now                   EDUCATION NEEDS:   Not appropriate for education at this  time  Skin:  Skin Assessment: Skin Integrity Issues: Stage II: sacrum Incisions: head  Last BM:  07/23/22 type 6  Height:   Ht Readings from Last 1 Encounters:  07/08/22 5\' 3"  (1.6 m)    Weight:   Wt Readings from Last 1 Encounters:  07/20/22 57.1 kg    BMI:  Body mass index is 22.3 kg/m.  Estimated Nutritional Needs:   Kcal:  1450-1650  Protein:  75-90 grams  Fluid:  > 1.5 L/day    Gustavus Bryant, MS, RD, LDN Inpatient Clinical Dietitian Please see AMiON for contact information.

## 2022-07-24 NOTE — Progress Notes (Signed)
   Providing Compassionate, Quality Care - Together   Subjective: Family is at the bedside. No new issues.   Objective: Vital signs in last 24 hours: Temp:  [98.5 F (36.9 C)-99.4 F (37.4 C)] 99.4 F (37.4 C) (01/25 1135) Pulse Rate:  [80-106] 94 (01/25 1135) Resp:  [15-24] 22 (01/25 1135) BP: (121-157)/(61-97) 157/76 (01/25 1135) SpO2:  [98 %-100 %] 100 % (01/25 1135)  Intake/Output from previous day: 01/24 0701 - 01/25 0700 In: -  Out: 575 [Urine:575] Intake/Output this shift: No intake/output data recorded.  Alert, opens eyes spontaneously PERRLA, right gaze preference Follows commands RUE and RLE Flickers to pain LUE, subtle purposeful movement LLE No respiratory distress, on room air Craniotomy site is clean, dry, and intact    Lab Results: No results for input(s): "WBC", "HGB", "HCT", "PLT" in the last 72 hours. BMET No results for input(s): "NA", "K", "CL", "CO2", "GLUCOSE", "BUN", "CREATININE", "CALCIUM" in the last 72 hours.  Studies/Results: No results found.  Assessment/Plan: Patient is sixteen days status post craniotomy for evacuation of SDH and ICH by Dr. Annette Stable. Her neuro exam continues to improve. Pathology negative for malignant process. Specimen consistent with thrombus/hematoma. Patient failed extubation 07/14/2022. CCM again trialed extubation on 07/17/2022, but patient required reintubation. Palliative care is now involved. Patient was extubated to comfort measures on 07/22/2022 and is maintaining her airway.    LOS: 16 days   -Continue supportive efforts.   [name], DNP, AGNP-C Nurse Practitioner  [facility] [place] [address], [place], [place] [address] P: [phone]    F: [phone]  07/24/2022, 12:15 PM

## 2022-07-25 ENCOUNTER — Inpatient Hospital Stay (HOSPITAL_COMMUNITY): Payer: Medicare HMO

## 2022-07-25 DIAGNOSIS — Z515 Encounter for palliative care: Secondary | ICD-10-CM | POA: Diagnosis not present

## 2022-07-25 LAB — GLUCOSE, CAPILLARY
Glucose-Capillary: 108 mg/dL — ABNORMAL HIGH (ref 70–99)
Glucose-Capillary: 141 mg/dL — ABNORMAL HIGH (ref 70–99)
Glucose-Capillary: 141 mg/dL — ABNORMAL HIGH (ref 70–99)
Glucose-Capillary: 149 mg/dL — ABNORMAL HIGH (ref 70–99)
Glucose-Capillary: 130 mg/dL — ABNORMAL HIGH (ref 70–99)
Glucose-Capillary: 156 mg/dL — ABNORMAL HIGH (ref 70–99)
Glucose-Capillary: 113 mg/dL — ABNORMAL HIGH (ref 70–99)

## 2022-07-25 MED ORDER — FLEET ENEMA 7-19 GM/118ML RE ENEM: 1.0000 | ENEMA | Freq: Every day | RECTAL | Status: DC | PRN

## 2022-07-25 MED ORDER — BISACODYL 10 MG RE SUPP: 10.0000 mg | Freq: Every day | RECTAL | Status: DC | PRN

## 2022-07-25 NOTE — Progress Notes (Signed)
   Providing Compassionate, Quality Care - Together   Subjective: Patient resting comfortably. No family at the bedside presently.  Objective: Vital signs in last 24 hours: Temp:  [98.2 F (36.8 C)-99.5 F (37.5 C)] 98.2 F (36.8 C) (01/26 0317) Pulse Rate:  [75-94] 75 (01/26 0317) Resp:  [15-22] 15 (01/26 0317) BP: (138-157)/(59-76) 145/59 (01/26 0317) SpO2:  [100 %] 100 % (01/26 0317)  Intake/Output from previous day: 01/25 0701 - 01/26 0700 In: -  Out: 2900 [Urine:2900] Intake/Output this shift: No intake/output data recorded.  Responds to voice, opens eyes spontaneously PERRLA, right gaze preference Follows commands RUE and RLE Flickers to pain LUE, subtle purposeful movement LLE No respiratory distress, on room air Craniotomy site is clean, dry, and intact    Lab Results: No results for input(s): "WBC", "HGB", "HCT", "PLT" in the last 72 hours. BMET No results for input(s): "NA", "K", "CL", "CO2", "GLUCOSE", "BUN", "CREATININE", "CALCIUM" in the last 72 hours.  Studies/Results: No results found.  Assessment/Plan: Patient is seventeen days status post craniotomy for evacuation of SDH and ICH by Dr. Annette Stable. Her neuro exam continues to improve. Pathology negative for malignant process. Specimen consistent with thrombus/hematoma. Patient failed extubation 07/14/2022. CCM again trialed extubation on 07/17/2022, but patient required reintubation. Palliative care is now involved. Patient was extubated to comfort measures on 07/22/2022 and is maintaining her airway.    LOS: 17 days   -Patient is medically stable to begin planning for [name] placement. -PT and OT ordered this AM by Palliative team. -Patient c/o abdominal pain. Abdominal x-ray ordered along with suppository and enema if needed. Last BM charted 07/23/2022.   [name], DNP, AGNP-C Nurse Practitioner  [facility] [place] [address], [place], [place] [address] P:  [phone]    F: [phone]  07/25/2022, 11:14 AM

## 2022-07-25 NOTE — Evaluation (Addendum)
Clinical/Bedside Swallow Evaluation Patient Details  Name: Gina Kelley MRN: [id number] Date of Birth: 11-Dec-1935  Today's Date: 07/25/2022 Time: SLP Start Time (ACUTE ONLY): 1154 SLP Stop Time (ACUTE ONLY): 1211 SLP Time Calculation (min) (ACUTE ONLY): 17 min  Past Medical History:  Past Medical History:  Diagnosis Date   Anemia of chronic disease 07/08/2006   Antral ulcer 02/23/2007   Seen on EGD in 2008, small ulcer with erosion    Cerebral vascular accident ([place]) 04/14/2006   1998, left lower extremity numbness, no residual deficits    Essential hypertension 04/14/2006   Gastroesophageal reflux disease 02/18/2013   Occasional, symptomatically relieved with peptobismol    Hyperlipidemia LDL goal < 100 04/14/2006   Hypertensive retinopathy of both eyes, grade 1 05/15/2016   Osteopenia of right femoral neck 10/20/2016   DEXA (10/16/2016): R femur T -2.5 (FRAX tool calculates at -2.4), L1-L4 spine T -0.9, 10 year risk for: Major osteoporotic fracture 8.3%, Hip fracture 2.7%   Type 2 diabetes mellitus with stage 1 chronic kidney disease, without long-term current use of insulin ([place])    Type II diabetes mellitus ([place]) 04/14/2006   Past Surgical History:  Past Surgical History:  Procedure Laterality Date   COLONOSCOPY     CRANIOTOMY Right 07/08/2022   Procedure: RIGHT CRANIOTOMY HEMATOMA EVACUATION SUBDURAL;  Surgeon: Earnie Larsson, MD;  Location: [facility];  Service: Neurosurgery;  Laterality: Right;   ESOPHAGOGASTRODUODENOSCOPY     HPI:  Pt is an 87 year old female who presented to the ED on 1/9 as a Code Stroke after being found unconscious, nonverbal, with left hemiplegia. CT Head demonstrated right IPH with 6mm midline shift and right frontoparietal SDH. Taken to OR 1/9 for R craniotomy/SDH evacuation. Postoperative repeat CT Head 1/10 demonstrated resolution of SDH post-crani and some residual hemorrhage without evidence of mass effect.  CT head 1/12 with Similar size of residual  intraparenchymal hemorrhage centered at the right frontal operculum, but with mildly increased localized edema. Mass effect with approximately 5 mm of right-to-left shift, mildly increased from prior. Increased prominence of small focal hypodensity involving the right basal ganglia, suspicious for an evolving ischemic infarct. Abnormal vasogenic edema involving the anterior right frontal lobe, concerning for underlying tumor/mass.  ETT 1/9-1/15; 1/15-18; 1/18-123 (1-way extubation). Palliative care consulted to assist with [name] in the setting of multiple failed extubations. Cortrak placed 1/15. PMH: HTN, HLD, CVA (1998 with residual LLE numbness), T2DM, osteopenia.    Assessment / Plan / Recommendation  Clinical Impression  Pt was seen for bedside swallow evaluation with her daughter present who denied the pt having a history of dysphagia prior to admission. Oral mechanism exam was significant for left-sided facial weakness and reduced lingual strength with minimal movement. She presented with symptoms of oropharyngeal dysphagia characterized by anterior spillage, minimal bolus manipulation with prompts, and absent swallowing. It is recommended that the pt's NPO status be maintained; oral care and moistened oral swabs are recommended to reduce oral pathogens and to maintain the integrity of the oral mucosa. SLP will follow pt. SLP Visit Diagnosis: Dysphagia, unspecified (R13.10)    Aspiration Risk  Moderate aspiration risk;Risk for inadequate nutrition/hydration    Diet Recommendation NPO   Medication Administration: Via alternative means    Other  Recommendations Oral Care Recommendations: Oral care QID    Recommendations for follow up therapy are one component of a multi-disciplinary discharge planning process, led by the attending physician.  Recommendations may be updated based on patient status, additional functional criteria and insurance  authorization.  Follow up Recommendations  (Continued  SLP services at level of care recommended by PT/OT)      Assistance Recommended at Discharge    Functional Status Assessment Patient has had a recent decline in their functional status and demonstrates the ability to make significant improvements in function in a reasonable and predictable amount of time.  Frequency and Duration min 2x/week  2 weeks       Prognosis Prognosis for Safe Diet Advancement: Fair Barriers to Reach Goals: Severity of deficits      Swallow Study   General Date of Onset: 07/22/22 HPI: Pt is an 87 year old female who presented to the ED on 1/9 as a Code Stroke after being found unconscious, nonverbal, with left hemiplegia. CT Head demonstrated right IPH with 84mm midline shift and right frontoparietal SDH. Taken to OR 1/9 for R craniotomy/SDH evacuation. Postoperative repeat CT Head 1/10 demonstrated resolution of SDH post-crani and some residual hemorrhage without evidence of mass effect.  CT head 1/12 with Similar size of residual intraparenchymal hemorrhage centered at the right frontal operculum, but with mildly increased localized edema. Mass effect with approximately 5 mm of right-to-left shift, mildly increased from prior. Increased prominence of small focal hypodensity involving the right basal ganglia, suspicious for an evolving ischemic infarct. Abnormal vasogenic edema involving the anterior right frontal lobe, concerning for underlying tumor/mass.  ETT 1/9-1/15; 1/15-18; 1/18-123 (1-way extubation). Palliative care consulted to assist with [name] in the setting of multiple failed extubations. Cortrak placed 1/15. PMH: HTN, HLD, CVA (1998 with residual LLE numbness), T2DM, osteopenia. Type of Study: Bedside Swallow Evaluation Previous Swallow Assessment: none Diet Prior to this Study: NPO;NG Tube Temperature Spikes Noted: No Respiratory Status: Room air History of Recent Intubation: Yes Length of Intubations (days): 14 days Date extubated:  07/22/22 Behavior/Cognition: Alert Oral Cavity Assessment: Within Functional Limits Oral Care Completed by SLP: No Vision: Functional for self-feeding Self-Feeding Abilities: Needs assist Patient Positioning: Upright in bed;Postural control adequate for testing Baseline Vocal Quality: Normal Volitional Cough: Cognitively unable to elicit Volitional Swallow: Unable to elicit    Oral/Motor/Sensory Function Overall Oral Motor/Sensory Function: Severe impairment Facial ROM: Reduced left;Suspected CN VII (facial) dysfunction Facial Symmetry: Suspected CN VII (facial) dysfunction;Abnormal symmetry left Facial Strength: Reduced left;Suspected CN VII (facial) dysfunction Lingual ROM: Reduced right;Reduced left;Suspected CN XII (hypoglossal) dysfunction Lingual Strength: Reduced;Suspected CN XII (hypoglossal) dysfunction   Ice Chips Ice chips: Impaired Presentation: Spoon Oral Phase Impairments: Reduced labial seal;Reduced lingual movement/coordination Oral Phase Functional Implications: Left anterior spillage   Thin Liquid Thin Liquid: Impaired Presentation: Spoon Oral Phase Impairments: Reduced labial seal;Reduced lingual movement/coordination Oral Phase Functional Implications: Left anterior spillage;Right lateral sulci pocketing    Nectar Thick Nectar Thick Liquid: Not tested   Honey Thick Honey Thick Liquid: Not tested   Puree Puree: Impaired Presentation: Spoon Oral Phase Impairments: Reduced labial seal;Reduced lingual movement/coordination Oral Phase Functional Implications: Left anterior spillage;Right anterior spillage   Solid     Solid: Not tested     Juanangel Soderholm I. Hardin Negus, [place], [place] Office number [place] 07/25/2022,12:28 PM

## 2022-07-25 NOTE — Progress Notes (Signed)
   Palliative Medicine Inpatient Follow Up Note HPI: 87 year old woman PMHx significant for HTN, HLD, CVA (1998 with residual LLE numbness), T2DM, osteopenia. Presented to [facility] ED via EMS 1/9 as a Code Stroke. CT Head demonstrated right IPH with 58mm midline shift and right frontoparietal SDH. Taken to OR 1/9 for R craniotomy/SDH evacuation. Palliative care has been asked to get involved to further support goals of care conversations given multiple failed extubations.    Today's Discussion 07/25/2022  *Please note that this is a verbal dictation therefore any spelling or grammatical errors are due to the "[place] [employer]" system interpretation.  Chart reviewed inclusive of vital signs, progress notes, laboratory results, and diagnostic images.   I met with Gina Kelley this morning in the presence of her nurse, Ashleigh.   Gina Kelley is able to raise her right hand and give me an "okay" sign. She initially nods yes to pain and points to her stomach and then when ask again nods no. Patient RN is aware and alerting the primary team. She was concerned that patient may be suffering from some constipation and provided laxatives per orders.  Discussed patients level of alertness and the pursuit of a speech evaluation. Patient will also require a PT and OT assessment given her recent improvements.  I spoke to patients oldest daughter, Gina Kelley who is hopeful for the future. She feels that she would like to see how her mother does presently to determine the course moving forward. Plan for continued scope of medical care which presently is treating the treatable.   Questions and concerns addressed/Palliative Support Provided.   Objective Assessment: Vital Signs Vitals:   07/24/22 2300 07/25/22 0317  BP: 138/70 (!) 145/59  Pulse: 77 75  Resp: 16 15  Temp:  98.2 F (36.8 C)  SpO2: 100% 100%    Intake/Output Summary (Last 24 hours) at 07/25/2022 7893 Last data filed at 07/25/2022 8101 Gross per 24 hour   Intake --  Output 2900 ml  Net -2900 ml    Last Weight  Most recent update: 07/20/2022  5:36 AM    Weight  57.1 kg (125 lb 14.1 oz)            Gen: Chronically ill-appearing African-American female  HEENT: ETT, core track, dry mucous membranes CV: Regular rate and rhythm PULM: On mechanical ventilator ABD: soft/nontender/nondistended EXT: Generalized edema Neuro: Responds with right hand  SUMMARY OF RECOMMENDATIONS   DNAR/DNI  Continue present scope of medical care at this time - Treat what is treatable  Speech therapy PT/OT have been ordered   Ongoing palliative support  Billing based on MDM: High ______________________________________________________________________________________ [place] Team Team Cell Phone: [phone] Please utilize secure chat with additional questions, if there is no response within 30 minutes please call the above phone number  Palliative Medicine Team providers are available by phone from 7am to 7pm daily and can be reached through the team cell phone.  Should this patient require assistance outside of these hours, please call the patient's attending physician.

## 2022-07-26 DIAGNOSIS — Z515 Encounter for palliative care: Secondary | ICD-10-CM | POA: Diagnosis not present

## 2022-07-26 DIAGNOSIS — Z7189 Other specified counseling: Secondary | ICD-10-CM | POA: Diagnosis not present

## 2022-07-26 LAB — GLUCOSE, CAPILLARY
Glucose-Capillary: 142 mg/dL — ABNORMAL HIGH (ref 70–99)
Glucose-Capillary: 139 mg/dL — ABNORMAL HIGH (ref 70–99)
Glucose-Capillary: 124 mg/dL — ABNORMAL HIGH (ref 70–99)
Glucose-Capillary: 129 mg/dL — ABNORMAL HIGH (ref 70–99)
Glucose-Capillary: 150 mg/dL — ABNORMAL HIGH (ref 70–99)
Glucose-Capillary: 131 mg/dL — ABNORMAL HIGH (ref 70–99)

## 2022-07-26 NOTE — Progress Notes (Addendum)
Physical Therapy Evaluation Patient Details Name: Gina Kelley MRN: [id number] DOB: 1936-02-03 Today's Date: 07/26/2022  History of Present Illness  Pt is an 87 year old woman admitted on 1/9 with stroke like symptoms. Found to have R IPH and R SDH, underwent craniotomy same day. Hospital course complicated by respiratory failure and failed extubation x 2. Palliative extubation 1/23 with pt able to maintain airway and improved neurologic status. PMH: HTN, HLD, DM2, CVA with residual L LE numbness, osteopenia.  Clinical Impression  Pt admitted with/for s/s of stroke, found to be 2 sites of bleeding on the R.  Course complicated by respiratory failure.  Pt needing mod to max assist of 1-2 persons.  Pt showing signs of inattention to the L, misses of following a few commands, but very participative overall.   Pt currently limited functionally due to the problems listed. ( See problems list.)   Pt will benefit from PT to maximize function and safety in order to get ready for next venue listed below.        Recommendations for follow up therapy are one component of a multi-disciplinary discharge planning process, led by the attending physician.  Recommendations may be updated based on patient status, additional functional criteria and insurance authorization.  Follow Up Recommendations Acute inpatient rehab (3hours/day)      Assistance Recommended at Discharge Intermittent Supervision/Assistance  Patient can return home with the following  A little help with walking and/or transfers;A little help with bathing/dressing/bathroom;Assistance with feeding;Assist for transportation;Help with stairs or ramp for entrance    Equipment Recommendations Other (comment) (TBD)  Recommendations for Other Services  Rehab consult    Functional Status Assessment Patient has had a recent decline in their functional status and demonstrates the ability to make significant improvements in function in a  reasonable and predictable amount of time.     Precautions / Restrictions Precautions Precautions: Fall      Mobility  Bed Mobility Overal bed mobility: Needs Assistance Bed Mobility: Rolling, Sidelying to Sit, Sit to Sidelying Rolling: +2 for physical assistance, Mod assist Sidelying to sit: +2 for physical assistance, Mod assist     Sit to sidelying: +2 for physical assistance, Mod assist      Transfers Overall transfer level: Needs assistance Equipment used: 2 person hand held assist Transfers: Sit to/from Stand Sit to Stand: +2 physical assistance, Max assist           General transfer comment: knees blocked, pt indicating R painful knee with weight bearing, flexed posture    Ambulation/Gait               General Gait Details: NT  Stairs            Wheelchair Mobility    Modified Rankin (Stroke Patients Only) Modified Rankin (Stroke Patients Only) Pre-Morbid Rankin Score: No symptoms Modified Rankin: Severe disability     Balance Overall balance assessment: Needs assistance Sitting-balance support: Feet supported Sitting balance-Leahy Scale: Fair Sitting balance - Comments: min guard,  + truncal activation to maintain midline     Standing balance-Leahy Scale: Zero                               Pertinent Vitals/Pain Pain Assessment Pain Assessment: Faces Faces Pain Scale: Hurts little more Pain Location: neck with gentle PROM, R knee Pain Descriptors / Indicators: Grimacing, Guarding Pain Intervention(s): Monitored during session    Home Living Family/patient expects  to be discharged to:: Private residence Living Arrangements: Spouse/significant other Available Help at Discharge: Family;Available 24 hours/day Type of Home: House Home Access: Stairs to enter   [employer] of Steps: 4   Home Layout: One level Home Equipment: None      Prior Function Prior Level of Function : Independent/Modified  Independent;Driving                     Hand Dominance   Dominant Hand: Right    Extremity/Trunk Assessment   Upper Extremity Assessment Upper Extremity Assessment: Defer to OT evaluation RUE Deficits / Details: generalized weakness LUE Deficits / Details: flaccid LUE: Subluxation noted (1/2 fingers width) LUE Sensation: decreased proprioception LUE Coordination: decreased fine motor;decreased gross motor    Lower Extremity Assessment Lower Extremity Assessment: RLE deficits/detail;LLE deficits/detail RLE Deficits / Details: knee pain with AAROM, grossly >3/5, but pain limiting standing RLE Coordination: decreased fine motor LLE Deficits / Details: moves well against gravity4/5 SLR and HS    Cervical / Trunk Assessment Cervical / Trunk Assessment: Kyphotic;Other exceptions (head rotated to R) Cervical / Trunk Exceptions: able to gently passively range neck 45 degrees toward L  Communication   Communication: Expressive difficulties (non verbal)  Cognition Arousal/Alertness: Awake/alert Behavior During Therapy: Flat affect Overall Cognitive Status: Difficult to assess                                 General Comments: pt with slow response speed, inconsistent reliability of yes/no answers, uses thumbs up and nods        General Comments General comments (skin integrity, edema, etc.): vss    Exercises Other Exercises Other Exercises: warm up LE exercise.   Assessment/Plan    PT Assessment Patient needs continued PT services  PT Problem List Decreased strength;Decreased activity tolerance;Decreased balance;Decreased mobility       PT Treatment Interventions Gait training;Functional mobility training;Therapeutic activities;Balance training;Neuromuscular re-education;Patient/family education    PT Goals (Current goals can be found in the Care Plan section)  Acute Rehab PT Goals Patient Stated Goal: pt agrees to getting better, independent PT  Goal Formulation: Patient unable to participate in goal setting Time For Goal Achievement: 08/09/22 Potential to Achieve Goals: Good    Frequency Min 4X/week     Co-evaluation PT/OT/SLP Co-Evaluation/Treatment: Yes Reason for Co-Treatment: For patient/therapist safety PT goals addressed during session: Mobility/safety with mobility OT goals addressed during session: Strengthening/ROM       AM-PAC PT "6 Clicks" Mobility  Outcome Measure Help needed turning from your back to your side while in a flat bed without using bedrails?: A Lot Help needed moving from lying on your back to sitting on the side of a flat bed without using bedrails?: A Lot Help needed moving to and from a bed to a chair (including a wheelchair)?: A Lot Help needed standing up from a chair using your arms (e.g., wheelchair or bedside chair)?: Total Help needed to walk in hospital room?: Total Help needed climbing 3-5 steps with a railing? : Total 6 Click Score: 9    End of Session   Activity Tolerance: Patient tolerated treatment well Patient left: in bed;with call bell/phone within reach;with family/visitor present Nurse Communication: Mobility status PT Visit Diagnosis: Other abnormalities of gait and mobility (R26.89);Other symptoms and signs involving the nervous system (R29.898);Muscle weakness (generalized) (M62.81)    Time: 5956-3875 PT Time Calculation (min) (ACUTE ONLY): 38 min  Charges:   PT Evaluation $PT Eval Moderate Complexity: 1 Mod          07/26/2022  Ginger Carne., PT Acute Rehabilitation Services [phone]  (office)  Tessie Fass Hubert Derstine 07/26/2022, 1:50 PM

## 2022-07-26 NOTE — Progress Notes (Signed)
Patient ID: Gina Kelley, female   DOB: 04/13/1936, 87 y.o.   MRN: [id number] Vital signs are stable Patient arouses to voice with eye opening  Not following commands.  Tube feeds running For snf.

## 2022-07-26 NOTE — Progress Notes (Signed)
Inpatient Rehab Admissions Coordinator Note:   Per therapy patient was screened for CIR candidacy by Kirbie Stodghill Danford Bad, CCC-SLP. At this time, pt appears to be a potential candidate for CIR. I will place an order for rehab consult for full assessment, per our protocol.  Please contact me any with questions.Gayland Curry, [place], [place] Admissions Coordinator [phone] 07/26/22 5:25 PM

## 2022-07-26 NOTE — Progress Notes (Signed)
   Palliative Medicine Inpatient Follow Up Note HPI: 87 year old woman PMHx significant for HTN, HLD, CVA (1998 with residual LLE numbness), T2DM, osteopenia. Presented to [facility] ED via EMS 1/9 as a Code Stroke. CT Head demonstrated right IPH with 73mm midline shift and right frontoparietal SDH. Taken to OR 1/9 for R craniotomy/SDH evacuation. Palliative care has been asked to get involved to further support goals of care conversations given multiple failed extubations.    Today's Discussion 07/26/2022  *Please note that this is a verbal dictation therefore any spelling or grammatical errors are due to the "[employer]" system interpretation.  Chart reviewed inclusive of vital signs, progress notes, laboratory results, and diagnostic images.   I met with Gina Kelley this morning. She remains able to follow commands on the right side and can wiggle both feet to some degree. She is able to open her eyes when prompted. She is trying to initiate words.   I spoke to patients RN, Ashleigh who feels that patient is doing well overall. She shares that Gina Kelley is interactive and seems to have an idea of what is going on around her.  I called patients daughter, Gina Kelley. I discussed with her patients current state. We reviewed the OT notes and the phone and I shared that the patient is a candidate for CIR. Patients daughter and I reviewed that in the ongoing days additional conversations will need to be held depending on patients progress or decline with speech therapy. Reviewed that Pams family will need to consider if they want a long term gastrostomy tube for her or not. Gina Kelley desires a meeting for further conversation related to this topic.   Questions and concerns addressed/Palliative Support Provided.   Objective Assessment: Vital Signs Vitals:   07/26/22 0809 07/26/22 1203  BP: 137/67 131/87  Pulse: 85 83  Resp: 16 18  Temp: 99 F (37.2 C) 98.1 F (36.7 C)  SpO2: 96% 94%    Intake/Output  Summary (Last 24 hours) at 07/26/2022 1345 Last data filed at 07/26/2022 0315 Gross per 24 hour  Intake --  Output 1450 ml  Net -1450 ml    Last Weight  Most recent update: 07/20/2022  5:36 AM    Weight  57.1 kg (125 lb 14.1 oz)            Gen: Chronically ill-appearing African-American female  HEENT: ETT, core track, dry mucous membranes CV: Regular rate and rhythm PULM: On mechanical ventilator ABD: soft/nontender/nondistended EXT: Generalized edema Neuro: Responds with right hand  SUMMARY OF RECOMMENDATIONS   DNAR/DNI  Patients family considering if Gina Kelley would desire a G-Tube or not at this time  Continue present scope of medical care at this time --> Treat what is treatable  Speech therapy PT/OT have been ordered --> CIR is recommended   Ongoing palliative support  Billing based on MDM: High ______________________________________________________________________________________ [place] Team Team Cell Phone: [phone] Please utilize secure chat with additional questions, if there is no response within 30 minutes please call the above phone number  Palliative Medicine Team providers are available by phone from 7am to 7pm daily and can be reached through the team cell phone.  Should this patient require assistance outside of these hours, please call the patient's attending physician.

## 2022-07-26 NOTE — Evaluation (Signed)
Occupational Therapy Evaluation Patient Details Name: Gina Kelley MRN: [id number] DOB: 1935/10/30 Today's Date: 07/26/2022   History of Present Illness Pt is an 87 year old woman admitted on 1/9 with stroke like symptoms. Found to have R IPH and R SDH, underwent craniotomy same day. Hospital course complicated by respiratory failure and failed extubation x 2. Palliative extubation 1/23 with pt able to maintain airway and improved neurologic status. PMH: HTN, HLD, DM2, CVA with residual L LE numbness, osteopenia.   Clinical Impression   Pt is typically independent and lives with her husband. Pt readily willing to work with therapies, but declined remaining up in chair at end of session. Pt presents with dense L UE hemiplegia, L inattention, limited neck ROM with head turned to L, baseline painful R knee and impaired standing balance. She requires +2 mod assist for bed mobility, + 2 max to stand. Sitting balance requires min guard assist. She is currently dependent in ADLs. VSS on RA. Pt will need intensive rehab and has excellent family support, recommending AIR.     Recommendations for follow up therapy are one component of a multi-disciplinary discharge planning process, led by the attending physician.  Recommendations may be updated based on patient status, additional functional criteria and insurance authorization.   Follow Up Recommendations  Acute inpatient rehab (3hours/day)     Assistance Recommended at Discharge Frequent or constant Supervision/Assistance  Patient can return home with the following Two people to help with walking and/or transfers;A lot of help with bathing/dressing/bathroom;Assistance with cooking/housework;Assistance with feeding;Direct supervision/assist for medications management;Direct supervision/assist for financial management;Assist for transportation;Help with stairs or ramp for entrance    Functional Status Assessment  Patient has had a recent decline in  their functional status and demonstrates the ability to make significant improvements in function in a reasonable and predictable amount of time.  Equipment Recommendations  Other (comment) (defer to next venue)    Recommendations for Other Services       Precautions / Restrictions Precautions Precautions: Fall      Mobility Bed Mobility Overal bed mobility: Needs Assistance Bed Mobility: Rolling, Sidelying to Sit, Sit to Sidelying Rolling: +2 for physical assistance, Mod assist Sidelying to sit: +2 for physical assistance, Mod assist     Sit to sidelying: +2 for physical assistance, Mod assist      Transfers Overall transfer level: Needs assistance Equipment used: 2 person hand held assist Transfers: Sit to/from Stand Sit to Stand: +2 physical assistance, Max assist           General transfer comment: knees blocked, pt indicating R painful knee with weight bearing, flexed posture      Balance Overall balance assessment: Needs assistance Sitting-balance support: Feet supported Sitting balance-Leahy Scale: Fair Sitting balance - Comments: min guard,  + truncal activation to maintain midline     Standing balance-Leahy Scale: Zero                             ADL either performed or assessed with clinical judgement   ADL                                         General ADL Comments: dependent     Vision Baseline Vision/History: 1 Wears glasses Additional Comments: pt reports no vision changes, needs further assessment     Perception  Perception Perception Tested?: Yes Perception Deficits: Inattention/neglect (L inattention) Inattention/Neglect: Does not attend to left side of body   Praxis      Pertinent Vitals/Pain Pain Assessment Pain Assessment: Faces Faces Pain Scale: Hurts little more Pain Location: neck with gentle PROM, R knee Pain Descriptors / Indicators: Grimacing, Guarding Pain Intervention(s): Monitored during  session, Repositioned     Hand Dominance Right   Extremity/Trunk Assessment Upper Extremity Assessment Upper Extremity Assessment: LUE deficits/detail;RUE deficits/detail RUE Deficits / Details: generalized weakness LUE Deficits / Details: flaccid LUE: Subluxation noted (1/2 fingers width) LUE Sensation: decreased proprioception LUE Coordination: decreased fine motor;decreased gross motor   Lower Extremity Assessment Lower Extremity Assessment: Defer to PT evaluation   Cervical / Trunk Assessment Cervical / Trunk Assessment: Kyphotic;Other exceptions (head rotated to R) Cervical / Trunk Exceptions: able to gently passively range neck 45 degrees toward L   Communication Communication Communication: Expressive difficulties (non verbal)   Cognition Arousal/Alertness: Awake/alert Behavior During Therapy: Flat affect Overall Cognitive Status: Difficult to assess                                 General Comments: pt with slow response speed, inconsistent reliability of yes/no answers, uses thumbs up and nods     General Comments       Exercises     Shoulder Instructions      Home Living Family/patient expects to be discharged to:: Private residence Living Arrangements: Spouse/significant other Available Help at Discharge: Family;Available 24 hours/day Type of Home: House Home Access: Stairs to enter [employer] of Steps: 4   Home Layout: One level     Bathroom Shower/Tub: Chief Strategy Officer: Standard     Home Equipment: None          Prior Functioning/Environment Prior Level of Function : Independent/Modified Independent;Driving                        OT Problem List: Decreased strength;Decreased activity tolerance;Impaired balance (sitting and/or standing);Decreased safety awareness;Pain;Impaired UE functional use;Decreased coordination;Decreased cognition;Impaired vision/perception      OT  Treatment/Interventions: Self-care/ADL training;Neuromuscular education;Cognitive remediation/compensation;Visual/perceptual remediation/compensation;Patient/family education;Balance training;Therapeutic activities    OT Goals(Current goals can be found in the care plan section) Acute Rehab OT Goals OT Goal Formulation: With patient/family Time For Goal Achievement: 08/09/22 Potential to Achieve Goals: Good ADL Goals Pt Will Perform Grooming: with min assist;sitting Pt Will Perform Upper Body Dressing: with min assist;sitting Pt Will Transfer to Toilet: with mod assist;stand pivot transfer;bedside commode Additional ADL Goal #1: Pt will locate visual targets in L hemispace with head turn 50% of trials. Additional ADL Goal #2: Pt will locate L UE with R to position and protect from injury during mobility.  OT Frequency: Min 2X/week    Co-evaluation PT/OT/SLP Co-Evaluation/Treatment: Yes Reason for Co-Treatment: For patient/therapist safety   OT goals addressed during session: Strengthening/ROM      AM-PAC OT "6 Clicks" Daily Activity     Outcome Measure Help from another person eating meals?: Total Help from another person taking care of personal grooming?: Total Help from another person toileting, which includes using toliet, bedpan, or urinal?: Total Help from another person bathing (including washing, rinsing, drying)?: Total Help from another person to put on and taking off regular upper body clothing?: Total Help from another person to put on and taking off regular lower body clothing?: Total 6 Click Score:  6   End of Session    Activity Tolerance: Patient tolerated treatment well Patient left: in bed;with call bell/phone within reach;with family/visitor present  OT Visit Diagnosis: Unsteadiness on feet (R26.81);Pain;Muscle weakness (generalized) (M62.81);Hemiplegia and hemiparesis;Other symptoms and signs involving cognitive function Hemiplegia - dominant/non-dominant:  Non-Dominant Hemiplegia - caused by: Nontraumatic intracerebral hemorrhage                Time: 7408-1448 OT Time Calculation (min): 38 min Charges:  OT General Charges $OT Visit: 1 Visit OT Evaluation $OT Eval Moderate Complexity: 1 Mod OT Treatments $Therapeutic Activity: 8-22 mins Cleta Alberts, OTR/L Acute Rehabilitation Services Office: [phone]   Gina Kelley 07/26/2022, 12:38 PM

## 2022-07-27 DIAGNOSIS — Z515 Encounter for palliative care: Secondary | ICD-10-CM | POA: Diagnosis not present

## 2022-07-27 LAB — GLUCOSE, CAPILLARY
Glucose-Capillary: 136 mg/dL — ABNORMAL HIGH (ref 70–99)
Glucose-Capillary: 152 mg/dL — ABNORMAL HIGH (ref 70–99)
Glucose-Capillary: 125 mg/dL — ABNORMAL HIGH (ref 70–99)
Glucose-Capillary: 125 mg/dL — ABNORMAL HIGH (ref 70–99)
Glucose-Capillary: 147 mg/dL — ABNORMAL HIGH (ref 70–99)
Glucose-Capillary: 127 mg/dL — ABNORMAL HIGH (ref 70–99)

## 2022-07-27 NOTE — Progress Notes (Signed)
Patient ID: Gina Kelley, female   DOB: 12/10/1935, 87 y.o.   MRN: [id number] BP 129/62 (BP Location: Left Arm)   Pulse 90   Temp 99 F (37.2 C) (Axillary)   Resp 17   Ht 5\' 3"  (1.6 m)   Wt 57.1 kg   SpO2 93%   BMI 22.30 kg/m  Awaiting placement Wound is clean, dry

## 2022-07-27 NOTE — Progress Notes (Signed)
   Palliative Medicine Inpatient Follow Up Note HPI: 87 year old woman PMHx significant for HTN, HLD, CVA (1998 with residual LLE numbness), T2DM, osteopenia. Presented to [facility] ED via EMS 1/9 as a Code Stroke. CT Head demonstrated right IPH with 36mm midline shift and right frontoparietal SDH. Taken to OR 1/9 for R craniotomy/SDH evacuation. Palliative care has been asked to get involved to further support goals of care conversations given multiple failed extubations.    Today's Discussion 07/27/2022  *Please note that this is a verbal dictation therefore any spelling or grammatical errors are due to the "[place] [employer]" system interpretation.  Chart reviewed inclusive of vital signs, progress notes, laboratory results, and diagnostic images.   I met with Pam this morning she was awake and alert. She endorses no pain per hand gestures. I spoke to her nurse technician and she continue to favor bending her neck to the right. She appears to be distressed when trying to straighten the neck out.  I called patients sister, [name] over the phone. We discussed patients present state. [name] is hopeful for her to go to CIR to see how well she is able to rehabilitate. She and discussed again, the importance of ongoing G-Tube conversations if patient should get to a point whereby she cannot eat and drink enough on her own. [name] feels hopeful as patient has tried to phonate with her as well as her grandson.   Discussed taking one day at time and appreciating the moments for what they are.   Questions and concerns addressed/Palliative Support Provided.   Objective Assessment: Vital Signs Vitals:   07/27/22 0336 07/27/22 0759  BP:  129/62  Pulse:  90  Resp:  17  Temp: 98.5 F (36.9 C) 99 F (37.2 C)  SpO2: 95% 93%    Intake/Output Summary (Last 24 hours) at 07/27/2022 1028 Last data filed at 07/27/2022 0424 Gross per 24 hour  Intake 780 ml  Output 1600 ml  Net -820 ml    Last Weight  Most  recent update: 07/20/2022  5:36 AM    Weight  57.1 kg (125 lb 14.1 oz)            Gen: Chronically ill-appearing African-American female  HEENT: ETT, core track, dry mucous membranes CV: Regular rate and rhythm PULM: On mechanical ventilator ABD: soft/nontender/nondistended EXT: Generalized edema Neuro: Responds with right hand  SUMMARY OF RECOMMENDATIONS   DNAR/DNI  Patients family considering if Pam would desire a G-Tube or not at this time --> Reviewed that they have time to consider this if accepted into acute rehab  Continue present scope of medical care at this time --> Treat what is treatable  Speech therapy PT/OT have been ordered --> CIR is recommended  Torticollis --> Heat pad and therapy   Ongoing palliative support --> Vinie Sill will be back to check in this early week  Billing based on MDM: Moderate  ______________________________________________________________________________________ [place] Team Team Cell Phone: [phone] Please utilize secure chat with additional questions, if there is no response within 30 minutes please call the above phone number  Palliative Medicine Team providers are available by phone from 7am to 7pm daily and can be reached through the team cell phone.  Should this patient require assistance outside of these hours, please call the patient's attending physician.

## 2022-07-28 LAB — GLUCOSE, CAPILLARY
Glucose-Capillary: 118 mg/dL — ABNORMAL HIGH (ref 70–99)
Glucose-Capillary: 144 mg/dL — ABNORMAL HIGH (ref 70–99)
Glucose-Capillary: 139 mg/dL — ABNORMAL HIGH (ref 70–99)
Glucose-Capillary: 128 mg/dL — ABNORMAL HIGH (ref 70–99)
Glucose-Capillary: 121 mg/dL — ABNORMAL HIGH (ref 70–99)
Glucose-Capillary: 118 mg/dL — ABNORMAL HIGH (ref 70–99)

## 2022-07-28 NOTE — Progress Notes (Signed)
   Providing Compassionate, Quality Care - Together   Subjective: Patient's daughter is at the bedside. Patient is up to chair. No new issues.  Objective: Vital signs in last 24 hours: Temp:  [98.3 F (36.8 C)-99.9 F (37.7 C)] 99.9 F (37.7 C) (01/29 0718) Pulse Rate:  [82-100] 83 (01/29 0718) Resp:  [12-34] 17 (01/29 1158) BP: (119-142)/(63-69) 125/63 (01/29 1158) SpO2:  [89 %-97 %] 94 % (01/29 1158)  Intake/Output from previous day: 01/28 0701 - 01/29 0700 In: 1000 [IV Piggyback:1000] Out: 1200 [Urine:1200] Intake/Output this shift: Total I/O In: 260 [NG/GT:60; IV Piggyback:200] Out: -   Responds to voice, opens eyes spontaneously PERRLA, right gaze preference Follows commands RUE and RLE Flickers to pain LUE, subtle purposeful movement LLE No respiratory distress, on room air Craniotomy site is clean, dry, and intact  Lab Results: No results for input(s): "WBC", "HGB", "HCT", "PLT" in the last 72 hours. BMET No results for input(s): "NA", "K", "CL", "CO2", "GLUCOSE", "BUN", "CREATININE", "CALCIUM" in the last 72 hours.  Studies/Results: No results found.  Assessment/Plan: Patient is twenty days status post craniotomy for evacuation of SDH and ICH by Dr. Annette Stable. Her neuro exam continues to improve. Pathology negative for malignant process. Specimen consistent with thrombus/hematoma. Patient failed extubation 07/14/2022. CCM again trialed extubation on 07/17/2022, but patient required reintubation. Patient was extubated on 07/22/2022 and is maintaining her airway.     LOS: 20 days   -Therapies are recommending CIR at discharge. -Continue to mobilize. -Bowel regimen; Dulcolax suppository and Fleet enema available if needed.   [name], DNP, AGNP-C Nurse Practitioner  [facility] [place] [address], [place], [place] [address] P: [phone]    F: [phone]  07/28/2022, 3:04 PM

## 2022-07-28 NOTE — Progress Notes (Signed)
OT Cancellation Note  Patient Details Name: MAKENZYE TROUTMAN MRN: [id number] DOB: [birth year]   Cancelled Treatment:    Reason Eval/Treat Not Completed: Fatigue/lethargy limiting ability to participate  Malka So 07/28/2022, 4:11 PM Cleta Alberts, OTR/L [place] Office: [phone]

## 2022-07-28 NOTE — Progress Notes (Signed)
Speech Language Pathology Treatment: Dysphagia  Gina Kelley Details Name: Gina Kelley MRN: [id number] DOB: 08/31/1935 Today's Date: 07/28/2022 Time: 6295-2841 SLP Time Calculation (min) (ACUTE ONLY): 18 min  Assessment / Plan / Recommendation Clinical Impression  Gina Kelley seen for dysphagia therapy. She was sleeping upon arrival but aroused with verbal and tactile cues. Head tilted to the right requiring physical assistance to position closer to midline. Open mouth posture noted at baseline. Oral care complete with removal of dried secretions on lips and tongue suggestive of decreased secretion management. Provided ice chip trials with Gina Kelley requiring max tactile assistance for lip closure in attempts to facilitate improved oral containment. Despite this assistance however, Gina Kelley unable to move tongue enough for movement of bolus posteriorly, no swallow initiated. Gina Kelley was able to move tongue slightly laterally upon command. Encouraged Gina Kelley and daughter to work on lingual movement and labial closure a few times a day in hopes of improving oral strength for ability to take pos. Will continue to f/u.   Note Gina Kelley using signals on hand to communicate with SLP. Request cognitive linguistic evaluation order from MD.    HPI HPI: Gina Kelley is an 87 year old female who presented to the ED on 1/9 as a Code Stroke after being found unconscious, nonverbal, with left hemiplegia. CT Head demonstrated right IPH with 70mm midline shift and right frontoparietal SDH. Taken to OR 1/9 for R craniotomy/SDH evacuation. Postoperative repeat CT Head 1/10 demonstrated resolution of SDH post-crani and some residual hemorrhage without evidence of mass effect.  CT head 1/12 with Similar size of residual intraparenchymal hemorrhage centered at the right frontal operculum, but with mildly increased localized edema. Mass effect with approximately 5 mm of right-to-left shift, mildly increased from prior. Increased prominence of  small focal hypodensity involving the right basal ganglia, suspicious for an evolving ischemic infarct. Abnormal vasogenic edema involving the anterior right frontal lobe, concerning for underlying tumor/mass.  ETT 1/9-1/15; 1/15-18; 1/18-123 (1-way extubation). Palliative care consulted to assist with [name] in the setting of multiple failed extubations. Cortrak placed 1/15. PMH: HTN, HLD, CVA (1998 with residual LLE numbness), T2DM, osteopenia.      SLP Plan  Continue with current plan of care     Recommendations for follow up therapy are one component of a multi-disciplinary discharge planning process, led by the attending physician.  Recommendations may be updated based on Gina Kelley status, additional functional criteria and insurance authorization.    Recommendations  Diet recommendations: NPO Medication Administration: Via alternative means               Oral Care Recommendations: Oral care QID Follow Up Recommendations: SLP at Long-term acute care hospital SLP Visit Diagnosis: Dysphagia, oropharyngeal phase (R13.12) Plan: Continue with current plan of care          [facility] MA, [place]  07/28/2022, 11:49 AM

## 2022-07-28 NOTE — Progress Notes (Signed)
Physical Therapy Treatment Patient Details Name: Gina Kelley MRN: [id number] DOB: [birth year] Today's Date: 07/28/2022   History of Present Illness Pt is an 87 year old woman admitted on 1/9 with stroke like symptoms. Found to have R IPH and R SDH, underwent craniotomy same day. Hospital course complicated by respiratory failure and failed extubation x 2. Palliative extubation 1/23 with pt able to maintain airway and improved neurologic status. PMH: HTN, HLD, DM2, CVA with residual L LE numbness, osteopenia.    PT Comments    Pt a little more lethargic today, but aroused and participated well.  Emphasis on warm up ROM LE>UE's, transition to EOB, work for >15 min at EOB on balance, holding midline upright posture, work on neck ROM, punching forward with balance, still needing min assist at least, sit to stand at EOB with knee guarding/blocking and pivot transfer to the chair.    Recommendations for follow up therapy are one component of a multi-disciplinary discharge planning process, led by the attending physician.  Recommendations may be updated based on patient status, additional functional criteria and insurance authorization.  Follow Up Recommendations  Acute inpatient rehab (3hours/day)     Assistance Recommended at Discharge Intermittent Supervision/Assistance  Patient can return home with the following A lot of help with walking and/or transfers;A little help with bathing/dressing/bathroom;Assistance with feeding;Assist for transportation;Help with stairs or ramp for entrance;Assistance with cooking/housework   Equipment Recommendations       Recommendations for Other Services Rehab consult     Precautions / Restrictions Precautions Precautions: Fall     Mobility  Bed Mobility Overal bed mobility: Needs Assistance Bed Mobility: Rolling, Sidelying to Sit Rolling: Max assist Sidelying to sit: Mod assist, Max assist       General bed mobility comments: more stiff and  not as awake, needing a little more assist, but pt participating well again today.    Transfers Overall transfer level: Needs assistance   Transfers: Sit to/from Stand, Bed to chair/wheelchair/BSC Sit to Stand: Max assist Stand pivot transfers: Max assist         General transfer comment: L knee plocked for the stand.  Pt needed forward and boost assist.  face to face pivot to the chair.    Ambulation/Gait               General Gait Details: NT   Stairs             Wheelchair Mobility    Modified Rankin (Stroke Patients Only) Modified Rankin (Stroke Patients Only) Pre-Morbid Rankin Score: No symptoms Modified Rankin: Severe disability     Balance Overall balance assessment: Needs assistance Sitting-balance support: Feet supported Sitting balance-Leahy Scale: Poor Sitting balance - Comments: worked longer this session overall, worked on upright posture, head up, doing some punching forward, tracking daughter in the room.  That said, she needed minimal assist overall.     Standing balance-Leahy Scale: Zero                              Cognition Arousal/Alertness: Awake/alert Behavior During Therapy: Flat affect Overall Cognitive Status: Difficult to assess                                 General Comments: responds non-verbally        Exercises Other Exercises Other Exercises: warm up LE ROM with graded assist/resistance  where appropriate. Other Exercises: warm up ROM UE's    General Comments        Pertinent Vitals/Pain Pain Assessment Pain Assessment: Faces Faces Pain Scale: Hurts little more Pain Location: neck with gentle PROM, R knee Pain Descriptors / Indicators: Grimacing, Guarding Pain Intervention(s): Monitored during session    Home Living                          Prior Function            PT Goals (current goals can now be found in the care plan section) Acute Rehab PT Goals PT Goal  Formulation: Patient unable to participate in goal setting Time For Goal Achievement: 08/09/22 Potential to Achieve Goals: Good Progress towards PT goals: Progressing toward goals    Frequency    Min 4X/week      PT Plan Current plan remains appropriate    Co-evaluation              AM-PAC PT "6 Clicks" Mobility   Outcome Measure  Help needed turning from your back to your side while in a flat bed without using bedrails?: A Lot Help needed moving from lying on your back to sitting on the side of a flat bed without using bedrails?: A Lot Help needed moving to and from a bed to a chair (including a wheelchair)?: Total Help needed standing up from a chair using your arms (e.g., wheelchair or bedside chair)?: Total Help needed to walk in hospital room?: Total Help needed climbing 3-5 steps with a railing? : Total 6 Click Score: 8    End of Session   Activity Tolerance: Patient tolerated treatment well Patient left: in chair;with call bell/phone within reach;with chair alarm set (on lift padding) Nurse Communication: Mobility status PT Visit Diagnosis: Other abnormalities of gait and mobility (R26.89);Other symptoms and signs involving the nervous system (R29.898);Muscle weakness (generalized) (M62.81)     Time: 3710-6269 PT Time Calculation (min) (ACUTE ONLY): 40 min  Charges:  $Therapeutic Exercise: 8-22 mins $Therapeutic Activity: 8-22 mins $Neuromuscular Re-education: 8-22 mins                     07/28/2022  Ginger Carne., PT Acute Rehabilitation Services [phone]  (office)   Tessie Fass Connie Hilgert 07/28/2022, 3:47 PM

## 2022-07-28 NOTE — Progress Notes (Signed)
Inpatient Rehab Admissions Coordinator:   Came to see pt at bedside to discuss rehab recommendations.  Pt sleeping soundly and does not wake to verbal or light tactile stimulation.  Will f/u again tomorrow.   Shann Medal, PT, DPT Admissions Coordinator [phone] 07/28/22  3:58 PM

## 2022-07-29 LAB — GLUCOSE, CAPILLARY
Glucose-Capillary: 143 mg/dL — ABNORMAL HIGH (ref 70–99)
Glucose-Capillary: 138 mg/dL — ABNORMAL HIGH (ref 70–99)
Glucose-Capillary: 138 mg/dL — ABNORMAL HIGH (ref 70–99)
Glucose-Capillary: 182 mg/dL — ABNORMAL HIGH (ref 70–99)
Glucose-Capillary: 149 mg/dL — ABNORMAL HIGH (ref 70–99)
Glucose-Capillary: 136 mg/dL — ABNORMAL HIGH (ref 70–99)

## 2022-07-29 MED ORDER — DIPHENHYDRAMINE HCL 50 MG/ML IJ SOLN
INTRAMUSCULAR | Status: AC
  Filled 2022-07-29: qty 1

## 2022-07-29 MED ORDER — DIPHENHYDRAMINE HCL 50 MG/ML IJ SOLN: 25.0000 mg | Freq: Once | INTRAMUSCULAR | Status: DC

## 2022-07-29 NOTE — Progress Notes (Signed)
Pt had sudden edema to R side of face. MD notified. Benadryl ordered.

## 2022-07-29 NOTE — Evaluation (Signed)
Speech Language Pathology Evaluation Patient Details Name: CASSADY STANCZAK MRN: [id number] DOB: 04-12-1936 Today's Date: 07/29/2022 Time:  -     Problem List:  Patient Active Problem List   Diagnosis Date Noted   Acute respiratory failure with hypoxia ([place]) 07/22/2022   DNR (do not resuscitate) 07/22/2022   Pressure injury of skin 07/22/2022   Ventilator dependence ([place]) 07/11/2022   Malnutrition of moderate degree 07/10/2022   S/P craniotomy 07/08/2022   ICH (intracerebral hemorrhage) ([place]) 07/08/2022   Need for prophylactic vaccination against Streptococcus pneumoniae (pneumococcus) 11/15/2021   Prolapse of female pelvic organs 05/15/2020   Osteopenia of right femoral neck 10/20/2016   Hypertensive retinopathy of both eyes, grade 1 05/15/2016   Gastroesophageal reflux disease without esophagitis 02/18/2013   Healthcare maintenance 12/04/2011   Anemia in chronic renal disease 07/08/2006   Type 2 diabetes mellitus with stage 1 chronic kidney disease, without long-term current use of insulin ([place]) 04/14/2006   Hyperlipidemia 04/14/2006   Essential hypertension 04/14/2006   Past Medical History:  Past Medical History:  Diagnosis Date   Anemia of chronic disease 07/08/2006   Antral ulcer 02/23/2007   Seen on EGD in 2008, small ulcer with erosion    Cerebral vascular accident ([place]) 04/14/2006   1998, left lower extremity numbness, no residual deficits    Essential hypertension 04/14/2006   Gastroesophageal reflux disease 02/18/2013   Occasional, symptomatically relieved with peptobismol    Hyperlipidemia LDL goal < 100 04/14/2006   Hypertensive retinopathy of both eyes, grade 1 05/15/2016   Osteopenia of right femoral neck 10/20/2016   DEXA (10/16/2016): R femur T -2.5 (FRAX tool calculates at -2.4), L1-L4 spine T -0.9, 10 year risk for: Major osteoporotic fracture 8.3%, Hip fracture 2.7%   Type 2 diabetes mellitus with stage 1 chronic kidney disease, without long-term current use  of insulin ([place])    Type II diabetes mellitus ([place]) 04/14/2006   Past Surgical History:  Past Surgical History:  Procedure Laterality Date   COLONOSCOPY     CRANIOTOMY Right 07/08/2022   Procedure: RIGHT CRANIOTOMY HEMATOMA EVACUATION SUBDURAL;  Surgeon: Earnie Larsson, MD;  Location: [facility];  Service: Neurosurgery;  Laterality: Right;   ESOPHAGOGASTRODUODENOSCOPY     HPI:  Pt is an 87 year old female who presented to the ED on 1/9 as a Code Stroke after being found unconscious, nonverbal, with left hemiplegia. CT Head demonstrated right IPH with 10mm midline shift and right frontoparietal SDH. Taken to OR 1/9 for R craniotomy/SDH evacuation. Postoperative repeat CT Head 1/10 demonstrated resolution of SDH post-crani and some residual hemorrhage without evidence of mass effect.  CT head 1/12 with Similar size of residual intraparenchymal hemorrhage centered at the right frontal operculum, but with mildly increased localized edema. Mass effect with approximately 5 mm of right-to-left shift, mildly increased from prior. Increased prominence of small focal hypodensity involving the right basal ganglia, suspicious for an evolving ischemic infarct. Abnormal vasogenic edema involving the anterior right frontal lobe, concerning for underlying tumor/mass.  ETT 1/9-1/15; 1/15-18; 1/18-123 (1-way extubation). Palliative care consulted to assist with [name] in the setting of multiple failed extubations. Cortrak placed 1/15. PMH: HTN, HLD, CVA (1998 with residual LLE numbness), T2DM, osteopenia.   Assessment / Plan / Recommendation Clinical Impression  Pt demonstrates facial edema today that family and NT reports are new. SLP informed Dr. Annette Stable via secure chat. Given labial and lingual edema, SLP did not work on swallowing.   Pt attempting to communicate via gestures (OK symbol, pointing) and able  to follow commands with right hand. Auditory comprehension appears in tact. Pt able to attempt to follow oral motor commands  but ROM severely limited. Pt able to write a dictated word on a white board, but could not formulate her own words due to severe visual impairment and right gaze deviation (letters written on top of each other). Pt also unable to point to letters on a letter board of images on a communication board due to gaze deviation. Most successful method so far seems to be writing. Daughter eager to help, instructed her on working on writing by dictating simple words for pt to write, like family names. Copying may also be helpful. Will continue alternative communication method efforts.    SLP Assessment  SLP Recommendation/Assessment: Patient needs continued Speech Lanaguage Pathology Services    Recommendations for follow up therapy are one component of a multi-disciplinary discharge planning process, led by the attending physician.  Recommendations may be updated based on patient status, additional functional criteria and insurance authorization.    Follow Up Recommendations       Assistance Recommended at Discharge     Functional Status Assessment    Frequency and Duration min 2x/week  2 weeks      SLP Evaluation Cognition  Overall Cognitive Status: Impaired/Different from baseline Arousal/Alertness: Awake/alert Orientation Level: Oriented to person Attention: Focused;Sustained Focused Attention: Appears intact Sustained Attention: Impaired Sustained Attention Impairment: Verbal basic;Functional basic       Comprehension  Auditory Comprehension Overall Auditory Comprehension: Appears within functional limits for tasks assessed    Expression Verbal Expression Overall Verbal Expression: Impaired Repetition: No impairment Non-Verbal Means of Communication: Gestures Written Expression Dominant Hand: Right Written Expression: Exceptions to [facility] Dictation Ability: Letter Self Formulation Ability: Word   Oral / Motor  Oral Motor/Sensory Function Overall Oral Motor/Sensory Function: Severe  impairment Facial ROM: Reduced left;Suspected CN VII (facial) dysfunction Facial Symmetry: Abnormal symmetry left;Suspected CN VII (facial) dysfunction;Abnormal symmetry right Facial Strength: Reduced right;Reduced left Facial Sensation: Reduced right;Reduced left Lingual ROM: Reduced right;Reduced left Mandible: Within Functional Limits Motor Speech Overall Motor Speech: Impaired Respiration: Within functional limits Phonation: Low vocal intensity Articulation: Impaired Level of Impairment: Word Intelligibility: Intelligibility reduced Word: 0-24% accurate Interfering Components:  (facial edema)            Tereza Gilham, [name] 07/29/2022, 11:21 AM

## 2022-07-29 NOTE — Progress Notes (Signed)
Inpatient Rehab Coordinator Note:  I met with patient, her two daughters, and her spouse at bedside to discuss CIR recommendations and goals/expectations of CIR stay.  We reviewed 3 hrs/day of therapy, physician follow up, and average length of stay 2 weeks (dependent upon progress) with goals of mod assist.  We discussed certain need for extensive physical assist at discharge and family reports they are working to pull this together.  We discussed that insurance will not approve [name] following CIR, so plan must be to d/c home regardless of assist level and they are in agreement.  Will plan to open insurance today.   Shann Medal, PT, DPT Admissions Coordinator [phone] 07/29/22  2:22 PM

## 2022-07-29 NOTE — Progress Notes (Signed)
Overall stable.  No new issues.  Continue rehab efforts.

## 2022-07-29 NOTE — Progress Notes (Signed)
Occupational Therapy Treatment Patient Details Name: Gina Kelley MRN: [id number] DOB: [birth year] Today's Date: 07/29/2022   History of present illness Pt is an 86 year old woman admitted on 1/9 with stroke like symptoms. Found to have R IPH and R SDH, underwent craniotomy same day. Hospital course complicated by respiratory failure and failed extubation x 2. Palliative extubation 1/23 with pt able to maintain airway and improved neurologic status. PMH: HTN, HLD, DM2, CVA with residual L LE numbness, osteopenia.   OT comments  Pt with new swelling in lip/face - nsg aware. Pt sleepy however demonstrated ability to follow all commands with excellent participation. Assisted with grooming and upper body bathing tasks @ bed level. Progressed to EOB with Max A with pt initiating mobility by moving legs toward EOB. Able to sustain midline postural control EOB with Min A at times. Attempted to use Stedy, however will need +2 for safety. Will monitor for need for L resting hand splint. Will assess use of occlusion taping over R visual field to increase attention to L once edema reduce R eye. Feel pt will make progress with AIR, however will need at least mod A with ADL and mobility and 24/7 care after DC, most likely at wc level.    Recommendations for follow up therapy are one component of a multi-disciplinary discharge planning process, led by the attending physician.  Recommendations may be updated based on patient status, additional functional criteria and insurance authorization.    Follow Up Recommendations  Acute inpatient rehab (3hours/day)     Assistance Recommended at Discharge Frequent or constant Supervision/Assistance  Patient can return home with the following  Two people to help with walking and/or transfers;A lot of help with bathing/dressing/bathroom;Assistance with cooking/housework;Assistance with feeding;Direct supervision/assist for medications management;Direct supervision/assist  for financial management;Assist for transportation;Help with stairs or ramp for entrance   Equipment Recommendations  Wheelchair (measurements OT);Wheelchair cushion (measurements OT);Hospital bed;Other (comment) (hoyer)    Recommendations for Other Services Rehab consult    Precautions / Restrictions Precautions Precautions: Fall Precaution Comments: coretrack Restrictions Weight Bearing Restrictions: No       Mobility Bed Mobility Overal bed mobility: Needs Assistance Bed Mobility: Supine to Sit, Sit to Supine   Sidelying to sit: Max assist Supine to sit: Max assist, HOB elevated Sit to supine: Max assist   General bed mobility comments: pt initiates with LEs, is able to reach across body with RUE; lifting head to help pull up    Transfers                   General transfer comment: Attempted to use stedy; pt able to place R hand on steady howeer very forward flexed; Will need +2 assist for safety and to manage off air mattress     Balance Overall balance assessment: Needs assistance Sitting-balance support: Single extremity supported, Feet supported Sitting balance-Leahy Scale: Poor Sitting balance - Comments: moments of minguard assist; improved since last session                                   ADL either performed or assessed with clinical judgement   ADL Overall ADL's : Needs assistance/impaired Eating/Feeding: NPO   Grooming: Maximal assistance Grooming Details (indicate cue type and reason): washing face Upper Body Bathing: Maximal assistance Upper Body Bathing Details (indicate cue type and reason): assisting with washing her upper body Lower Body Bathing: Total assistance  Upper Body Dressing : Total assistance   Lower Body Dressing: Total assistance                      Extremity/Trunk Assessment Upper Extremity Assessment RUE Deficits / Details: generalized weakness LUE Deficits / Details: flaccid (will order  resting hand splint for night use) LUE: Subluxation noted   Lower Extremity Assessment Lower Extremity Assessment: Defer to PT evaluation (movign BLE; R more so than L)        Vision   Additional Comments: reports that she wears glasses; unable to find in room   Perception Perception Perception: Impaired (L neglect; able to find LUE using R hand to help lift)   Praxis      Cognition Arousal/Alertness: Awake/alert Behavior During Therapy: Flat affect Overall Cognitive Status: Difficult to assess                                 General Comments: followed all commands; vocalized "yes" and "my neck"; communicating by thumbs up/down; attmepting to write however unable to read what she is writing at this time        Exercises Exercises: Other exercises Other Exercises Other Exercises: general LUE PROM as tp;erated Other Exercises: "windshielf wiper" move with BLE with knees flexed to facilitate trunk rotation followed by reaching toard L with RUE to facilitae rolling to side; movement imporved with repetition    Shoulder Instructions       General Comments VSS    Pertinent Vitals/ Pain       Pain Assessment Pain Assessment: Faces Faces Pain Scale: Hurts even more Pain Location: R facial swelling and neck; R knee Pain Descriptors / Indicators: Grimacing Pain Intervention(s): Limited activity within patient's tolerance  Home Living                                          Prior Functioning/Environment              Frequency  Min 2X/week        Progress Toward Goals  OT Goals(current goals can now be found in the care plan section)  Progress towards OT goals: Progressing toward goals  Acute Rehab OT Goals OT Goal Formulation: With patient Time For Goal Achievement: 08/09/22 Potential to Achieve Goals: Good ADL Goals Pt Will Perform Grooming: with min assist;sitting Pt Will Perform Upper Body Dressing: with min  assist;sitting Pt Will Transfer to Toilet: with mod assist;stand pivot transfer;bedside commode Additional ADL Goal #1: Pt will locate visual targets in L hemispace with head turn 50% of trials. Additional ADL Goal #2: Pt will locate L UE with R to position and protect from injury during mobility.  Plan Discharge plan remains appropriate    Co-evaluation                 AM-PAC OT "6 Clicks" Daily Activity     Outcome Measure   Help from another person eating meals?: Total Help from another person taking care of personal grooming?: A Lot Help from another person toileting, which includes using toliet, bedpan, or urinal?: Total Help from another person bathing (including washing, rinsing, drying)?: A Lot Help from another person to put on and taking off regular upper body clothing?: Total Help from another person to put on and taking off regular  lower body clothing?: Total 6 Click Score: 8    End of Session    OT Visit Diagnosis: Unsteadiness on feet (R26.81);Pain;Muscle weakness (generalized) (M62.81);Hemiplegia and hemiparesis;Other symptoms and signs involving cognitive function;Other abnormalities of gait and mobility (R26.89);Low vision, both eyes (H54.2);Other symptoms and signs involving the nervous system (R29.898) Hemiplegia - Right/Left: Left Hemiplegia - dominant/non-dominant: Non-Dominant Hemiplegia - caused by: Nontraumatic intracerebral hemorrhage Pain - Right/Left: Right Pain - part of body: Knee (neck)   Activity Tolerance Patient tolerated treatment well   Patient Left in bed;with call bell/phone within reach;with SCD's reapplied   Nurse Communication Mobility status        Time: 2947-6546 OT Time Calculation (min): 39 min  Charges: OT General Charges $OT Visit: 1 Visit OT Treatments $Self Care/Home Management : 8-22 mins $Therapeutic Activity: 8-22 mins $Neuromuscular Re-education: 8-22 mins  Maurie Boettcher, OT/L   Acute OT Clinical  Specialist Acute Rehabilitation Services Pager [phone] Office [phone]   [facility] 07/29/2022, 3:44 PM

## 2022-07-29 NOTE — Progress Notes (Signed)
Physical Therapy Treatment Patient Details Name: Gina Kelley MRN: [id number] DOB: 1936/01/24 Today's Date: 07/29/2022   History of Present Illness Pt is an 87 year old woman admitted on 1/9 with stroke like symptoms. Found to have R IPH and R SDH, underwent craniotomy same day. Hospital course complicated by respiratory failure and failed extubation x 2. Palliative extubation 1/23 with pt able to maintain airway and improved neurologic status. PMH: HTN, HLD, DM2, CVA with residual L LE numbness, osteopenia.    PT Comments    Pt tolerates treatment well, continuing to focus efforts on sitting balance and improving neck ROM. Pt continues to demonstrate tightness in R cervical rotators. Pt demonstrates a posterior lean in sitting and profound weakness in R side, requiring significant physical assistance to perform bed mobility or transfer. Pt refuses transfer out of bed at this time. PT encourages visitors to stay on left side and to encourage the pt to attempt to turn head and look to left side when communicating. PT continues to recommend AIR at this time.   Recommendations for follow up therapy are one component of a multi-disciplinary discharge planning process, led by the attending physician.  Recommendations may be updated based on patient status, additional functional criteria and insurance authorization.  Follow Up Recommendations  Acute inpatient rehab (3hours/day)     Assistance Recommended at Discharge Intermittent Supervision/Assistance  Patient can return home with the following A lot of help with walking and/or transfers;A lot of help with bathing/dressing/bathroom;Assistance with cooking/housework;Assistance with feeding;Direct supervision/assist for medications management;Direct supervision/assist for financial management;Assist for transportation;Help with stairs or ramp for entrance   Equipment Recommendations  Hospital bed (hoyer lift)    Recommendations for Other  Services       Precautions / Restrictions Precautions Precautions: Fall Restrictions Weight Bearing Restrictions: No     Mobility  Bed Mobility Overal bed mobility: Needs Assistance Bed Mobility: Supine to Sit, Sit to Supine     Supine to sit: Max assist, HOB elevated Sit to supine: Total assist   General bed mobility comments: pt initiates with LEs, is able to reach across body with RUE and utilizes PT hand hold to assist in elevating trunk    Transfers Overall transfer level: Needs assistance Equipment used: 1 person hand held assist Transfers: Sit to/from Stand Sit to Stand: Max assist           General transfer comment: R knee block, BUE support, pt with flexed posture throughout brief period of standing    Ambulation/Gait                   Stairs             Wheelchair Mobility    Modified Rankin (Stroke Patients Only) Modified Rankin (Stroke Patients Only) Pre-Morbid Rankin Score: No symptoms Modified Rankin: Severe disability     Balance Overall balance assessment: Needs assistance Sitting-balance support: Single extremity supported, Feet supported Sitting balance-Leahy Scale: Poor Sitting balance - Comments: min-modA, posterior lean. working on increasing trunk flexion in an effort to avoid posterior lean. Working on L cervical rotation in sitting, with AAROM and stretching provided by PT Postural control: Posterior lean Standing balance support: Bilateral upper extremity supported Standing balance-Leahy Scale: Zero                              Cognition Arousal/Alertness: Awake/alert Behavior During Therapy: Flat affect Overall Cognitive Status: Difficult to assess  General Comments: responds non-verbally for majority of session, does attempt to speak but noises are unintelligible at this time        Exercises Other Exercises Other Exercises: AAROM left cervical  rotation, passive stretching x3 with hold for 15-20 seconds based on pt tolerance Other Exercises: AROM L cervical rotation (limited to midline at best) x 5 reps Other Exercises: ankle pumps x15 bilaterally    General Comments General comments (skin integrity, edema, etc.): VSS      Pertinent Vitals/Pain Pain Assessment Pain Assessment: Faces Faces Pain Scale: Hurts even more Pain Location: R facial swelling and neck Pain Descriptors / Indicators: Grimacing Pain Intervention(s): Monitored during session    Home Living                          Prior Function            PT Goals (current goals can now be found in the care plan section) Acute Rehab PT Goals Patient Stated Goal: pt agrees to getting better, independent Progress towards PT goals: Progressing toward goals    Frequency    Min 4X/week      PT Plan Current plan remains appropriate    Co-evaluation              AM-PAC PT "6 Clicks" Mobility   Outcome Measure  Help needed turning from your back to your side while in a flat bed without using bedrails?: A Lot Help needed moving from lying on your back to sitting on the side of a flat bed without using bedrails?: A Lot Help needed moving to and from a bed to a chair (including a wheelchair)?: Total Help needed standing up from a chair using your arms (e.g., wheelchair or bedside chair)?: A Lot Help needed to walk in hospital room?: Total Help needed climbing 3-5 steps with a railing? : Total 6 Click Score: 9    End of Session   Activity Tolerance: Patient tolerated treatment well Patient left: in bed;with call bell/phone within reach;with family/visitor present Nurse Communication: Mobility status;Need for lift equipment PT Visit Diagnosis: Other abnormalities of gait and mobility (R26.89);Other symptoms and signs involving the nervous system (R29.898);Muscle weakness (generalized) (M62.81)     Time: 5277-8242 PT Time Calculation (min)  (ACUTE ONLY): 36 min  Charges:  $Therapeutic Exercise: 8-22 mins $Therapeutic Activity: 8-22 mins                     Zenaida Niece, PT, DPT Acute Rehabilitation Office [place] Glendia Olshefski 07/29/2022, 10:47 AM

## 2022-07-30 LAB — GLUCOSE, CAPILLARY
Glucose-Capillary: 125 mg/dL — ABNORMAL HIGH (ref 70–99)
Glucose-Capillary: 128 mg/dL — ABNORMAL HIGH (ref 70–99)
Glucose-Capillary: 121 mg/dL — ABNORMAL HIGH (ref 70–99)
Glucose-Capillary: 159 mg/dL — ABNORMAL HIGH (ref 70–99)
Glucose-Capillary: 167 mg/dL — ABNORMAL HIGH (ref 70–99)
Glucose-Capillary: 129 mg/dL — ABNORMAL HIGH (ref 70–99)

## 2022-07-30 MED ORDER — DIPHENHYDRAMINE HCL 12.5 MG/5ML PO ELIX: 12.5000 mg | ORAL_SOLUTION | Freq: Three times a day (TID) | ORAL | Status: DC | PRN

## 2022-07-30 MED ORDER — WHITE PETROLATUM EX OINT
TOPICAL_OINTMENT | CUTANEOUS | Status: DC | PRN
  Administered 2022-07-31: 0.2 via TOPICAL
  Filled 2022-07-30 (×3): qty 28.35

## 2022-07-30 NOTE — PMR Pre-admission (Signed)
PMR Admission Coordinator Pre-Admission Assessment  Patient: Gina Kelley is an 87 y.o., female MRN: [id number] DOB: May 01, 1936 Height: 5\' 3"  (160 cm) Weight: 97.3 kg  Insurance Information HMO: yes    PPO:      PCP:      IPA:      80/20:      OTHER:  PRIMARY: [employer]      Policy#: [id number]      Subscriber: pt CM Name: casey c      Phone#: [phone] ext [phone]     Fax#: [phone] Pre-Cert#: [id number] [place] for CIR from [place] with [employer] with updates due to Hilbert Odor at fax listed above on 2/8 [phone])      Employer:  Benefits:  Phone #: [phone]     Name:  Eff. Date: 06/30/22     Deduct: $0      Out of Pocket Max: $3600 (met $721.48)      Life Max: n/a CIR: $295/day for days 1-7      [name]: $20/day for 20 days, then $203/day Outpatient:      Co-Pay: $25/visit Home Health: 100%      Co-Pay:  DME: 80%     Co-Ins: 20% Providers:  SECONDARY:       Policy#:      Phone#:   Development worker, community:       Phone#:   The Therapist, art Information Summary" for patients in Inpatient Rehabilitation Facilities with attached "Privacy Act [place] Records" was provided and verbally reviewed with: Patient and Family  Emergency Contact Information Contact Information     Name Relation Home Work Mobile   Evaro Daughter [place] Son   [phone]   Noela, Brothers [phone]  [phone]       Current Medical History  Patient Admitting Diagnosis: R IPH/R SDH s/p craniotomy  History of Present Illness: Pt is a 87 y/o female with PMH of HTN, DM, CVA with residual LLE numbness, and osteopenia admitted to [facility] on 1/9 with stroke like symptoms.  EMS note SBP 270 with R gaze preference and L hemiparesis.  ED course labs and vitals unremarkable.  Imaging revealed right sided intraparenchymal hemorrhage with MLS.  Neurology consulted and started on Cleviprex for BP control.  Decision was made to intubate  given liklihood of worsening.  Neurosurgery was consulted.  Concern for irregularity around hemorrhage and significant R frontal edema worrisome for possible intrinsic brain tumor.  Pt underwent R craniotomy and evacuation with biopsy of suspicious tissue on 1/9 per Dr. Annette Stable.  Pathology negative for malignant process.  Pt failed extubation on 2 attempts and underwent compassionate 1-way extubation on 1/23.  She was able to maintain her airway.  Therapy evaluations completed and pt was recommended for CIR due to functional decline.   Complete NIHSS TOTAL: 23  [facility]'s medical record from [facility] has been reviewed by the rehabilitation admission coordinator and physician.  Past Medical History  Past Medical History:  Diagnosis Date   Anemia of chronic disease 07/08/2006   Antral ulcer 02/23/2007   Seen on EGD in 2008, small ulcer with erosion    Cerebral vascular accident ([place]) 04/14/2006   1998, left lower extremity numbness, no residual deficits    Essential hypertension 04/14/2006   Gastroesophageal reflux disease 02/18/2013   Occasional, symptomatically relieved with peptobismol    Hyperlipidemia LDL goal < 100 04/14/2006   Hypertensive retinopathy of both eyes, grade 1 05/15/2016   Osteopenia of  right femoral neck 10/20/2016   DEXA (10/16/2016): R femur T -2.5 (FRAX tool calculates at -2.4), L1-L4 spine T -0.9, 10 year risk for: Major osteoporotic fracture 8.3%, Hip fracture 2.7%   Type 2 diabetes mellitus with stage 1 chronic kidney disease, without long-term current use of insulin ([place])    Type II diabetes mellitus ([place]) 04/14/2006    Has the patient had major surgery during 100 days prior to admission? Yes  Family History   family history includes Anuerysm (age of onset: 22) in her father; Asthma in her brother and sister; Breast cancer in her sister; Cirrhosis in her brother; Diabetes in her mother; Hypertension in her mother and son; Pulmonary disease in her brother; Stroke in her  mother and sister.  Current Medications  Current Facility-Administered Medications:    acetaminophen (TYLENOL) tablet 650 mg, 650 mg, Per Tube, Q4H PRN, 650 mg at 07/30/22 0654 **OR** acetaminophen (TYLENOL) suppository 650 mg, 650 mg, Rectal, Q4H PRN, Hunsucker, Bonna Gains, MD   amLODipine (NORVASC) tablet 10 mg, 10 mg, Per Tube, Daily, Hunsucker, Bonna Gains, MD, 10 mg at 08/01/22 1247   atorvastatin (LIPITOR) tablet 80 mg, 80 mg, Per Tube, Daily, Hunsucker, Bonna Gains, MD, 80 mg at 08/01/22 1246   bisacodyl (DULCOLAX) suppository 10 mg, 10 mg, Rectal, Daily PRN, Bergman, Meghan D, NP   diphenhydrAMINE (BENADRYL) 12.5 MG/5ML elixir 12.5 mg, 12.5 mg, Per Tube, Q8H PRN, Bergman, Meghan D, NP   diphenhydrAMINE (BENADRYL) injection 25 mg, 25 mg, Intravenous, Once, Bergman, Meghan D, NP   ezetimibe (ZETIA) tablet 10 mg, 10 mg, Per Tube, Daily, Hunsucker, Bonna Gains, MD, 10 mg at 08/01/22 1247   feeding supplement (GLUCERNA 1.5 CAL) liquid 1,000 mL, 1,000 mL, Per Tube, Continuous, Hunsucker, Bonna Gains, MD, Stopped at 07/31/22 1900   free water 200 mL, 200 mL, Per Tube, Q6H, Hunsucker, Bonna Gains, MD, 200 mL at 07/31/22 1858   glucagon (human recombinant) (GLUCAGEN) 1 MG injection, , , ,    glycopyrrolate (ROBINUL) injection 0.4 mg, 0.4 mg, Intravenous, Q4H, Hunsucker, Bonna Gains, MD, 0.4 mg at 08/01/22 1248   hydrALAZINE (APRESOLINE) injection 10 mg, 10 mg, Intravenous, Q6H PRN, Hunsucker, Bonna Gains, MD   insulin aspart (novoLOG) injection 0-15 Units, 0-15 Units, Subcutaneous, Q4H, Hunsucker, Bonna Gains, MD, 3 Units at 07/31/22 1700   labetalol (NORMODYNE) injection 10-40 mg, 10-40 mg, Intravenous, Q4H PRN, Hunsucker, Bonna Gains, MD   levETIRAcetam (KEPPRA) IVPB 500 mg/100 mL premix, 500 mg, Intravenous, Q12H, Hunsucker, Bonna Gains, MD, Last Rate: 400 mL/hr at 08/01/22 0813, 500 mg at 08/01/22 0813   lidocaine (LIDODERM) 5 % 1 patch, 1 patch, Transdermal, Q24H, Bergman, Meghan D, NP, 1 patch at 08/01/22 1250    losartan (COZAAR) tablet 50 mg, 50 mg, Per Tube, Daily, Hunsucker, Bonna Gains, MD, 50 mg at 08/01/22 1246   morphine (PF) 2 MG/ML injection 2 mg, 2 mg, Intravenous, Q2H PRN, Hunsucker, Bonna Gains, MD   ondansetron (ZOFRAN) tablet 4 mg, 4 mg, Oral, Q4H PRN **OR** ondansetron (ZOFRAN) injection 4 mg, 4 mg, Intravenous, Q4H PRN, Hunsucker, Bonna Gains, MD   pantoprazole (PROTONIX) injection 40 mg, 40 mg, Intravenous, QHS, Hunsucker, Bonna Gains, MD, 40 mg at 07/31/22 2303   polyethylene glycol (MIRALAX / GLYCOLAX) packet 17 g, 17 g, Per Tube, Daily, Hunsucker, Bonna Gains, MD, 17 g at 07/31/22 0830   promethazine (PHENERGAN) tablet 12.5-25 mg, 12.5-25 mg, Oral, Q4H PRN, Hunsucker, Bonna Gains, MD   senna-docusate (Senokot-S) tablet 1 tablet, 1 tablet, Per Tube,  BID, Hunsucker, Bonna Gains, MD, 1 tablet at 08/01/22 1246   sodium phosphate (FLEET) 7-19 GM/118ML enema 1 enema, 1 enema, Rectal, Daily PRN, Reinaldo Meeker, Meghan D, NP   white petrolatum (VASELINE) gel, , Topical, PRN, [name] D, NP, 0.2 Application at 60/63/01 0846  Patients Current Diet:  Diet Order             Diet NPO time specified  Diet effective now                   Precautions / Restrictions Precautions Precautions: Fall Precaution Comments: coretrack Restrictions Weight Bearing Restrictions: No   Has the patient had 2 or more falls or a fall with injury in the past year? No  Prior Activity Level Community (5-7x/wk): indep, driving, no dme  Prior Functional Level Self Care: Did the patient need help bathing, dressing, using the toilet or eating? Independent  Indoor Mobility: Did the patient need assistance with walking from room to room (with or without device)? Independent  Stairs: Did the patient need assistance with internal or external stairs (with or without device)? Independent  Functional Cognition: Did the patient need help planning regular tasks such as shopping or remembering to take medications?  Independent  Patient Information Are you of Hispanic, Latino/a,or Spanish origin?: A. No, not of Hispanic, Latino/a, or Spanish origin What is your race?: B. Black or African American Do you need or want an interpreter to communicate with a doctor or health care staff?: 0. No  Patient's Response To:  Health Literacy and Transportation Is the patient able to respond to health literacy and transportation needs?: Yes Health Literacy - How often do you need to have someone help you when you read instructions, pamphlets, or other written material from your doctor or pharmacy?: Never In the past 12 months, has lack of transportation kept you from medical appointments or from getting medications?: No In the past 12 months, has lack of transportation kept you from meetings, work, or from getting things needed for daily living?: No  Home Assistive Devices / Equipment Home Equipment: None  Prior Device Use: Indicate devices/aids used by the patient prior to current illness, exacerbation or injury? None of the above  Current Functional Level Cognition  Arousal/Alertness: Awake/alert Overall Cognitive Status: Difficult to assess Difficult to assess due to: Impaired communication Orientation Level: Other (comment) (not answering questions) General Comments: followed all commands; vocalized "yes"  communicating by thumbs up/down;  "okay" sign, Attention: Focused, Sustained Focused Attention: Appears intact Sustained Attention: Impaired Sustained Attention Impairment: Verbal basic, Functional basic    Extremity Assessment (includes Sensation/Coordination)  Upper Extremity Assessment: Defer to OT evaluation RUE Deficits / Details: generalized weakness LUE Deficits / Details: flaccid (will order resting hand splint for night use) LUE: Subluxation noted LUE Sensation: decreased proprioception LUE Coordination: decreased fine motor, decreased gross motor  Lower Extremity Assessment: Defer to PT  evaluation (movign BLE; R more so than L) RLE Deficits / Details: knee pain with AAROM, grossly >3/5, but pain limiting standing RLE Coordination: decreased fine motor LLE Deficits / Details: moves well against gravity4/5 SLR and HS    ADLs  Overall ADL's : Needs assistance/impaired Eating/Feeding: NPO Grooming: Maximal assistance Grooming Details (indicate cue type and reason): washing face Upper Body Bathing: Maximal assistance Upper Body Bathing Details (indicate cue type and reason): assisting with washing her upper body Lower Body Bathing: Total assistance Upper Body Dressing : Total assistance Lower Body Dressing: Total assistance General ADL Comments: dependent  Mobility  Overal bed mobility: Needs Assistance Bed Mobility: Supine to Sit Rolling: Mod assist Sidelying to sit: Mod assist, Max assist, +2 for safety/equipment Supine to sit: Max assist, HOB elevated Sit to supine: Max assist Sit to sidelying: +2 for physical assistance, Mod assist General bed mobility comments: pt initiated assist in prepping for roll L, needing truncal /LE assis to roll, and truncal/LE assist up from L elbow and R hand to sitting EOB.  Pt needing scooting assist.    Transfers  Overall transfer level: Needs assistance Equipment used: 1 person hand held assist Transfers: Sit to/from Stand Sit to Stand: Max assist, +2 safety/equipment (x4 over the course of the session) Bed to/from chair/wheelchair/BSC transfer type:: Squat pivot Stand pivot transfers: Max assist Squat pivot transfers: Max assist, +2 safety/equipment General transfer comment: pt followed commands/cues well to sequence the transfer to stand and pivot to the chair.    Ambulation / Gait / Stairs / Wheelchair Mobility  Ambulation/Gait General Gait Details: unable    Posture / Balance Dynamic Sitting Balance Sitting balance - Comments: moments of minguard assist but overall needs minimal assist Balance Overall balance  assessment: Needs assistance Sitting-balance support: Single extremity supported, Feet supported Sitting balance-Leahy Scale: Poor Sitting balance - Comments: moments of minguard assist but overall needs minimal assist Postural control: Posterior lean Standing balance support: Bilateral upper extremity supported Standing balance-Leahy Scale: Zero Standing balance comment: pt had a difficult time standing up on her feet for a full peri care session    Special needs/care consideration Skin crani incision and Diabetic management yes   Previous Home Environment (from acute therapy documentation) Living Arrangements: Spouse/significant other Available Help at Discharge: Family, Available 24 hours/day Type of Home: House Home Layout: One level Home Access: Stairs to enter [employer] of Steps: 4 Bathroom Shower/Tub: Chiropodist: Standard  Discharge Living Setting Plans for Discharge Living Setting: Patient's home, Lives with (comment) (spouse) Type of Home at Discharge: House Discharge Home Layout: One level Discharge Home Access: Stairs to enter Entrance Stairs-Number of Steps: 4 Discharge Bathroom Shower/Tub: Tub/shower unit Discharge Bathroom Toilet: Standard Discharge Bathroom Accessibility: Yes How Accessible: Accessible via walker Does the patient have any problems obtaining your medications?: No  Social/Family/Support Systems Patient Roles: Spouse Anticipated Caregiver: children (daughters, son) Anticipated Ambulance person Information: Francesca Jewett (dtr) [phone] Ability/Limitations of Caregiver: n/a Caregiver Availability: 24/7 (working to pull together 24/7 physical care) Discharge Plan Discussed with Primary Caregiver: Yes Is Caregiver In Agreement with Plan?: Yes Does Caregiver/Family have Issues with Lodging/Transportation while Pt is in Rehab?: No  Goals Patient/Family Goal for Rehab: PT/OT/SLP mod assist Expected length of stay:  14-16 days Pt/Family Agrees to Admission and willing to participate: Yes Program Orientation Provided & Reviewed with Pt/Caregiver Including Roles  & Responsibilities: Yes  Barriers to Discharge: Insurance for [name] coverage, Home environment access/layout  Decrease burden of Care through IP rehab admission: n/a  Possible need for [name] placement upon discharge: not anticipated.  Family working on pulling together 24/7 assist.  I have reviewed likely need for extensive physical assist at d/c.   Patient Condition: I have reviewed medical records from [facility], spoken with CM, and patient, spouse, and daughter. I met with patient at the bedside for inpatient rehabilitation assessment.  Patient will benefit from ongoing PT, OT, and SLP, can actively participate in 3 hours of therapy a day 5 days of the week, and can make measurable gains during the admission.  Patient will also benefit from the  coordinated team approach during an Inpatient Acute Rehabilitation admission.  The patient will receive intensive therapy as well as Rehabilitation physician, nursing, social worker, and care management interventions.  Due to bladder management, bowel management, safety, skin/wound care, disease management, medication administration, pain management, and patient education the patient requires 24 hour a day rehabilitation nursing.  The patient is currently max +2 with mobility and basic ADLs.  Discharge setting and therapy post discharge at home with home health is anticipated.  Patient has agreed to participate in the Acute Inpatient Rehabilitation Program and will admit today.  Preadmission Screen Completed By:  Stephania Fragmin, PT, DPT 08/01/2022 1:00 PM ______________________________________________________________________   Discussed status with Dr. Shearon Stalls on 08/01/22  at now  and received approval for admission today.  Admission Coordinator:  Stephania Fragmin, PT, time 1:00 PM /Date 08/01/22     Assessment/Plan: Diagnosis: Does the need for close, 24 hr/day Medical supervision in concert with the patient's rehab needs make it unreasonable for this patient to be served in a less intensive setting? {yes_no_potentially:3041433} Co-Morbidities requiring supervision/potential complications: *** Due to {due HY:8657846}, does the patient require 24 hr/day rehab nursing? {yes_no_potentially:3041433} Does the patient require coordinated care of a physician, rehab nurse, PT, OT, and SLP to address physical and functional deficits in the context of the above medical diagnosis(es)? {yes_no_potentially:3041433} Addressing deficits in the following areas: {deficits:3041436} Can the patient actively participate in an intensive therapy program of at least 3 hrs of therapy 5 days a week? {yes_no_potentially:3041433} The potential for patient to make measurable gains while on inpatient rehab is {potential:3041437} Anticipated functional outcomes upon discharge from inpatient rehab: {functional outcomes:304600100} PT, {functional outcomes:304600100} OT, {functional outcomes:304600100} SLP Estimated rehab length of stay to reach the above functional goals is: *** Anticipated discharge destination: {anticipated dc setting:21604} 10. Overall Rehab/Functional Prognosis: {potential:3041437}   MD Signature: ***

## 2022-07-30 NOTE — Progress Notes (Signed)
   Providing Compassionate, Quality Care - Together   Subjective: Stopped by patient's room to speak with patient's family per their request. No family is present at the bedside. Patient is resting comfortably.  Objective: Vital signs in last 24 hours: Temp:  [98.7 F (37.1 C)-99.7 F (37.6 C)] 98.9 F (37.2 C) (01/31 1511) Pulse Rate:  [85-106] 106 (01/31 1511) Resp:  [14-20] 20 (01/31 1511) BP: (129-148)/(54-85) 132/85 (01/31 1511) SpO2:  [91 %-95 %] 91 % (01/31 1511)  Intake/Output from previous day: 01/30 0701 - 01/31 0700 In: -  Out: 850 [Urine:850] Intake/Output this shift: Total I/O In: -  Out: 650 [Urine:650]  Responds to voice, opens eyes spontaneously PERRLA, right gaze preference Follows commands RUE and RLE Flickers to pain LUE, subtle purposeful movement LLE Lips swollen No respiratory distress, on room air Craniotomy site is clean, dry, and intact  Lab Results: No results for input(s): "WBC", "HGB", "HCT", "PLT" in the last 72 hours. BMET No results for input(s): "NA", "K", "CL", "CO2", "GLUCOSE", "BUN", "CREATININE", "CALCIUM" in the last 72 hours.  Studies/Results: No results found.  Assessent/Plan: Patient is twenty-two days status post craniotomy for evacuation of SDH and ICH by Dr. Annette Stable. Her neuro exam continues to improve. Pathology negative for malignant process. Specimen consistent with thrombus/hematoma. Patient failed extubation 07/14/2022. CCM again trialed extubation on 07/17/2022, but patient required reintubation. Patient was extubated on 07/22/2022 and is maintaining her airway. Patient had episode of facial swelling on 07/29/2022. Benadryl administered and swelling improved some.    LOS: 22 days   -Called patient's daughter, [name], and discussed lip swelling. Will discontinue chlorhexidine skin wipes and oral mouthwash. Nursing staff and family can brush patient's teeth with toothbrush and toothpaste. -Benadryl ordered as  needed.   [name], DNP, AGNP-C Nurse Practitioner  [facility] [place] [address], [address], [place], [place] [address] P: [phone]    F: [phone]  07/30/2022, 5:19 PM

## 2022-07-30 NOTE — Progress Notes (Signed)
No significant change in status.  Patient remains somnolent but will awaken to stimulation.  Will follow some simple commands on the right left-sided weakness stable.  Continue supportive efforts.  No change in plan.  Working toward placement.

## 2022-07-30 NOTE — Progress Notes (Shared)
PMR Admission Coordinator Pre-Admission Assessment   Patient: Gina Kelley is an 87 y.o., female MRN: [id number] DOB: July 28, 1935 Height: 5\' 3"  (160 cm) Weight: 57.1 kg   Insurance Information HMO: ***    PPO: ***     PCP: ***     IPA: ***     80/20: ***     OTHER: *** PRIMARY: ***      Policy#: ***      Subscriber: *** CM Name: ***      Phone#: ***     Fax#: *** Pre-Cert#: ***      Employer: *** Benefits:  Phone #: ***     Name: *** Eff. Date: ***     Deduct: ***      Out of Pocket Max: ***      Life Max: *** CIR: ***      [name]: *** Outpatient: ***     Co-Pay: *** Home Health: ***      Co-Pay: *** DME: ***     Co-Pay: *** Providers: *** SECONDARY: ***      Policy#: ***     Phone#: ***   Financial Counselor: ***      Phone#: ***   The "Data Collection Information Summary" for patients in Inpatient Rehabilitation Facilities with attached "Privacy Act Statement-Health Care Records" was provided and verbally reviewed with: Patient and Family   Emergency Contact Information Contact Information       Name Relation Home Work Mobile    [place] Daughter [phone]        [phone]     [phone]           Current Medical History  Patient Admitting Diagnosis: R IPH/R SDH s/p craniotomy  History of Present Illness: Pt is a 87 y/o female with PMH of HTN, DM, CVA with residual LLE numbness, and osteopenia admitted to [facility] on 1/9 with stroke like symptoms.  EMS note SBP 270 with R gaze preference and L hemiparesis.  ED course labs and vitals unremarkable.  Imaging revealed right sided intraparenchymal hemorrhage with MLS.  Neurology consulted and started on Cleviprex for BP control.  Decision was made to intubate given liklihood of worsening.  Neurosurgery was consulted.  Concern for irregularity around hemorrhage and significant R frontal edema worrisome for possible intrinsic brain tumor.  Pt underwent R craniotomy and evacuation with biopsy of suspicious  tissue on 1/9 per Dr. 3/9.  Pathology negative for malignant process.  Pt failed extubation on 2 attempts and underwent compassionate 1-way extubation on 1/23.  She was able to maintain her airway.  Therapy evaluations completed and pt was recommended for CIR due to functional decline.    Complete NIHSS TOTAL: 23   Patient's medical record from 2/23 has been reviewed by the rehabilitation admission coordinator and physician.   Past Medical History      Past Medical History:  Diagnosis Date   Anemia of chronic disease 07/08/2006   Antral ulcer 02/23/2007    Seen on EGD in 2008, small ulcer with erosion    Cerebral vascular accident (HCC) 04/14/2006    1998, left lower extremity numbness, no residual deficits    Essential hypertension 04/14/2006   Gastroesophageal reflux disease 02/18/2013    Occasional, symptomatically relieved with peptobismol    Hyperlipidemia LDL goal < 100 04/14/2006   Hypertensive retinopathy of both eyes, grade 1 05/15/2016   Osteopenia of right femoral neck 10/20/2016    DEXA (10/16/2016): R femur T -2.5 (FRAX tool  calculates at -2.4), L1-L4 spine T -0.9, 10 year risk for: Major osteoporotic fracture 8.3%, Hip fracture 2.7%   Type 2 diabetes mellitus with stage 1 chronic kidney disease, without long-term current use of insulin (HCC)     Type II diabetes mellitus ([place]) 04/14/2006      Has the patient had major surgery during 100 days prior to admission? Yes   Family History   family history includes Anuerysm (age of onset: 12) in her father; Asthma in her brother and sister; Breast cancer in her sister; Cirrhosis in her brother; Diabetes in her mother; Hypertension in her mother and son; Pulmonary disease in her brother; Stroke in her mother and sister.   Current Medications   Current Facility-Administered Medications:    acetaminophen (TYLENOL) tablet 650 mg, 650 mg, Per Tube, Q4H PRN, 650 mg at 07/30/22 0654 **OR** acetaminophen (TYLENOL) suppository 650 mg,  650 mg, Rectal, Q4H PRN, Hunsucker, Bonna Gains, MD   amLODipine (NORVASC) tablet 10 mg, 10 mg, Per Tube, Daily, Hunsucker, Bonna Gains, MD, 10 mg at 07/30/22 5462   atorvastatin (LIPITOR) tablet 80 mg, 80 mg, Per Tube, Daily, Hunsucker, Bonna Gains, MD, 80 mg at 07/30/22 7035   bisacodyl (DULCOLAX) suppository 10 mg, 10 mg, Rectal, Daily PRN, [name] D, NP   Chlorhexidine Gluconate Cloth 2 % PADS 6 each, 6 each, Topical, Daily, Hunsucker, Bonna Gains, MD, 6 each at 07/29/22 2334   diphenhydrAMINE (BENADRYL) injection 25 mg, 25 mg, Intravenous, Once, Bergman, Meghan D, NP   ezetimibe (ZETIA) tablet 10 mg, 10 mg, Per Tube, Daily, Hunsucker, Bonna Gains, MD, 10 mg at 07/30/22 0093   feeding supplement (GLUCERNA 1.5 CAL) liquid 1,000 mL, 1,000 mL, Per Tube, Continuous, Hunsucker, Bonna Gains, MD, Last Rate: 45 mL/hr at 07/27/22 1600, Infusion Verify at 07/27/22 1600   free water 200 mL, 200 mL, Per Tube, Q6H, Hunsucker, Bonna Gains, MD, 200 mL at 07/30/22 1227   glycopyrrolate (ROBINUL) injection 0.4 mg, 0.4 mg, Intravenous, Q4H, Hunsucker, Bonna Gains, MD, 0.4 mg at 07/30/22 1227   hydrALAZINE (APRESOLINE) injection 10 mg, 10 mg, Intravenous, Q6H PRN, Hunsucker, Bonna Gains, MD   insulin aspart (novoLOG) injection 0-15 Units, 0-15 Units, Subcutaneous, Q4H, Hunsucker, Bonna Gains, MD, 2 Units at 07/30/22 1227   labetalol (NORMODYNE) injection 10-40 mg, 10-40 mg, Intravenous, Q4H PRN, Hunsucker, Bonna Gains, MD   levETIRAcetam (KEPPRA) IVPB 500 mg/100 mL premix, 500 mg, Intravenous, Q12H, Hunsucker, Bonna Gains, MD, Last Rate: 400 mL/hr at 07/30/22 0800, 500 mg at 07/30/22 0800   losartan (COZAAR) tablet 50 mg, 50 mg, Per Tube, Daily, Hunsucker, Bonna Gains, MD, 50 mg at 07/30/22 8182   morphine (PF) 2 MG/ML injection 2 mg, 2 mg, Intravenous, Q2H PRN, Hunsucker, Bonna Gains, MD   ondansetron (ZOFRAN) tablet 4 mg, 4 mg, Oral, Q4H PRN **OR** ondansetron (ZOFRAN) injection 4 mg, 4 mg, Intravenous, Q4H PRN, Hunsucker, Bonna Gains, MD   Oral care mouth rinse, 15 mL, Mouth Rinse, 4 times per day, Earnie Larsson, MD, 15 mL at 07/30/22 1226   Oral care mouth rinse, 15 mL, Mouth Rinse, PRN, Pool, Mallie Mussel, MD   pantoprazole (PROTONIX) injection 40 mg, 40 mg, Intravenous, QHS, Hunsucker, Bonna Gains, MD, 40 mg at 07/29/22 2159   polyethylene glycol (MIRALAX / GLYCOLAX) packet 17 g, 17 g, Per Tube, Daily, Hunsucker, Bonna Gains, MD, 17 g at 07/30/22 9937   promethazine (PHENERGAN) tablet 12.5-25 mg, 12.5-25 mg, Oral, Q4H PRN, Hunsucker, Bonna Gains, MD   senna-docusate (Senokot-S) tablet  1 tablet, 1 tablet, Per Tube, BID, Hunsucker, Lesia Sago, MD, 1 tablet at 07/30/22 5621   sodium phosphate (FLEET) 7-19 GM/118ML enema 1 enema, 1 enema, Rectal, Daily PRN, Val Eagle D, NP   Patients Current Diet:  Diet Order                  Diet NPO time specified  Diet effective now                         Precautions / Restrictions Precautions Precautions: Fall Precaution Comments: coretrack Restrictions Weight Bearing Restrictions: No    Has the patient had 2 or more falls or a fall with injury in the past year? No   Prior Activity Level Community (5-7x/wk): indep, driving, no dme   Prior Functional Level Self Care: Did the patient need help bathing, dressing, using the toilet or eating? Independent   Indoor Mobility: Did the patient need assistance with walking from room to room (with or without device)? Independent   Stairs: Did the patient need assistance with internal or external stairs (with or without device)? Independent   Functional Cognition: Did the patient need help planning regular tasks such as shopping or remembering to take medications? Independent   Patient Information Are you of Hispanic, Latino/a,or Spanish origin?: A. No, not of Hispanic, Latino/a, or Spanish origin What is your race?: B. Black or African American Do you need or want an interpreter to communicate with a doctor or health care staff?: 0.  No   Patient's Response To:  Health Literacy and Transportation Is the patient able to respond to health literacy and transportation needs?: Yes Health Literacy - How often do you need to have someone help you when you read instructions, pamphlets, or other written material from your doctor or pharmacy?: Never In the past 12 months, has lack of transportation kept you from medical appointments or from getting medications?: No In the past 12 months, has lack of transportation kept you from meetings, work, or from getting things needed for daily living?: No   Home Assistive Devices / Equipment Home Equipment: None   Prior Device Use: Indicate devices/aids used by the patient prior to current illness, exacerbation or injury? None of the above   Current Functional Level Cognition   Arousal/Alertness: Awake/alert Overall Cognitive Status: Difficult to assess Difficult to assess due to: Impaired communication Orientation Level: Oriented to person, Oriented to place General Comments: followed all commands; vocalized "yes" and "my neck"; communicating by thumbs up/down; attmepting to write however unable to read what she is writing at this time Attention: Focused, Sustained Focused Attention: Appears intact Sustained Attention: Impaired Sustained Attention Impairment: Verbal basic, Functional basic    Extremity Assessment (includes Sensation/Coordination)   Upper Extremity Assessment: Defer to OT evaluation RUE Deficits / Details: generalized weakness LUE Deficits / Details: flaccid (will order resting hand splint for night use) LUE: Subluxation noted LUE Sensation: decreased proprioception LUE Coordination: decreased fine motor, decreased gross motor  Lower Extremity Assessment: Defer to PT evaluation (movign BLE; R more so than L) RLE Deficits / Details: knee pain with AAROM, grossly >3/5, but pain limiting standing RLE Coordination: decreased fine motor LLE Deficits / Details: moves  well against gravity4/5 SLR and HS     ADLs   Overall ADL's : Needs assistance/impaired Eating/Feeding: NPO Grooming: Maximal assistance Grooming Details (indicate cue type and reason): washing face Upper Body Bathing: Maximal assistance Upper Body Bathing Details (indicate cue type  and reason): assisting with washing her upper body Lower Body Bathing: Total assistance Upper Body Dressing : Total assistance Lower Body Dressing: Total assistance General ADL Comments: dependent     Mobility   Overal bed mobility: Needs Assistance Bed Mobility: Supine to Sit, Sit to Supine Rolling: Max assist Sidelying to sit: Max assist Supine to sit: Max assist, HOB elevated Sit to supine: Max assist Sit to sidelying: +2 for physical assistance, Mod assist General bed mobility comments: pt initiates with LEs, is able to reach across body with RUE; lifting head to help pull up     Transfers   Overall transfer level: Needs assistance Equipment used: 1 person hand held assist Transfers: Sit to/from Stand Sit to Stand: Max assist Bed to/from chair/wheelchair/BSC transfer type::  (pt refuses transfer out of bed) Stand pivot transfers: Max assist General transfer comment: Attempted to use stedy; pt able to place R hand on steady howeer very forward flexed; Will need +2 assist     Ambulation / Gait / Stairs / Wheelchair Mobility   Ambulation/Gait General Gait Details: NT     Posture / Balance Dynamic Sitting Balance Sitting balance - Comments: moments of minguard assist Balance Overall balance assessment: Needs assistance Sitting-balance support: Single extremity supported, Feet supported Sitting balance-Leahy Scale: Poor Sitting balance - Comments: moments of minguard assist Postural control: Posterior lean Standing balance support: Bilateral upper extremity supported Standing balance-Leahy Scale: Zero     Special needs/care consideration Skin crani incision and Diabetic management yes     Previous Home Environment (from acute therapy documentation) Living Arrangements: Spouse/significant other Available Help at Discharge: Family, Available 24 hours/day Type of Home: House Home Layout: One level Home Access: Stairs to enter [employer] of Steps: 4 Bathroom Shower/Tub: Chiropodist: Standard   Discharge Living Setting Plans for Discharge Living Setting: Patient's home, Lives with (comment) (spouse) Type of Home at Discharge: House Discharge Home Layout: One level Discharge Home Access: Stairs to enter Entrance Stairs-Number of Steps: 4 Discharge Bathroom Shower/Tub: Tub/shower unit Discharge Bathroom Toilet: Standard Discharge Bathroom Accessibility: Yes How Accessible: Accessible via walker Does the patient have any problems obtaining your medications?: No   Social/Family/Support Systems Patient Roles: Spouse Anticipated Caregiver: children (daughters, son) Anticipated Ambulance person Information: Francesca Jewett (dtr) [phone] Ability/Limitations of Caregiver: n/a Caregiver Availability: 24/7 (working to pull together 24/7 physical care) Discharge Plan Discussed with Primary Caregiver: Yes Is Caregiver In Agreement with Plan?: Yes Does Caregiver/Family have Issues with Lodging/Transportation while Pt is in Rehab?: No   Goals Patient/Family Goal for Rehab: PT/OT/SLP mod assist Expected length of stay: 14-16 days Pt/Family Agrees to Admission and willing to participate: Yes Program Orientation Provided & Reviewed with Pt/Caregiver Including Roles  & Responsibilities: Yes  Barriers to Discharge: Insurance for [name] coverage, Home environment access/layout   Decrease burden of Care through IP rehab admission: n/a   Possible need for [name] placement upon discharge: not anticipated.  Family working on pulling together 24/7 assist.  I have reviewed likely need for extensive physical assist at d/c.    Patient Condition: I have reviewed  medical records from [facility], spoken with CM, and patient, spouse, and daughter. I met with patient at the bedside for inpatient rehabilitation assessment.  Patient will benefit from ongoing PT, OT, and SLP, can actively participate in 3 hours of therapy a day 5 days of the week, and can make measurable gains during the admission.  Patient will also benefit from the coordinated team approach during an  Inpatient Acute Rehabilitation admission.  The patient will receive intensive therapy as well as Rehabilitation physician, nursing, social worker, and care management interventions.  Due to bladder management, bowel management, safety, skin/wound care, disease management, medication administration, pain management, and patient education the patient requires 24 hour a day rehabilitation nursing.  The patient is currently max +2 with mobility and basic ADLs.  Discharge setting and therapy post discharge at home with home health is anticipated.  Patient has agreed to participate in the Acute Inpatient Rehabilitation Program and will admit pending insurance approval ***.   Preadmission Screen Completed By:  Michel Santee, PT, DPT 07/30/2022 1:08 PM _____________________________________________________________________

## 2022-07-31 ENCOUNTER — Encounter (HOSPITAL_COMMUNITY): Payer: Self-pay | Admitting: Neurosurgery

## 2022-07-31 LAB — GLUCOSE, CAPILLARY
Glucose-Capillary: 133 mg/dL — ABNORMAL HIGH (ref 70–99)
Glucose-Capillary: 111 mg/dL — ABNORMAL HIGH (ref 70–99)
Glucose-Capillary: 150 mg/dL — ABNORMAL HIGH (ref 70–99)
Glucose-Capillary: 151 mg/dL — ABNORMAL HIGH (ref 70–99)
Glucose-Capillary: 110 mg/dL — ABNORMAL HIGH (ref 70–99)
Glucose-Capillary: 95 mg/dL (ref 70–99)

## 2022-07-31 MED ORDER — LIDOCAINE 5 % EX PTCH
1.0000 | MEDICATED_PATCH | CUTANEOUS | Status: DC
  Administered 2022-07-31 – 2022-08-02 (×3): 1 via TRANSDERMAL
  Filled 2022-07-31 (×3): qty 1

## 2022-07-31 NOTE — Progress Notes (Signed)
Nutrition Follow-up  DOCUMENTATION CODES:   Non-severe (moderate) malnutrition in context of chronic illness  INTERVENTION:   Continue tube feeding via Cortrak: - Glucerna 1.5 @ 45 ml/hr (1080 ml/day) - Free water flushes of 200 ml q 6 hours  Tube feeding regimen provides 1620 kcal, 89 grams of protein, and 820 ml of H2O.   Total free water with flushes: 1620 ml  NUTRITION DIAGNOSIS:   Moderate Malnutrition related to chronic illness as evidenced by moderate fat depletion, moderate muscle depletion.  Ongoing, being addressed via TF  GOAL:   Patient will meet greater than or equal to 90% of their needs  Met via TF  MONITOR:   Diet advancement, Labs, Weight trends, TF tolerance, Skin  REASON FOR ASSESSMENT:   Consult Enteral/tube feeding initiation and management  ASSESSMENT:   Pt with PMH of DM, HTN, HLD, CKD admitted with ICH with SDH s/p craniotomy.  1/09  s/p R crani and biopsy of suspicious tissue worrisome for metastatic disease per MD  1/15 Extubated, reintubated later in the day; s/p cortrak placement, per xray tip in distal gastric antrum 01/18 - extubated, almost immediately reintubated 01/23 - one-way extubation  Noted therapies recommending CIR. Family considering PEG tube placement.  Pt sleeping soundly and did not awaken to RD voice. Tube feeds infusing as ordered via Cortrak. Will continue with current tube feeding regimen. If PEG is pursued, can consider switching to bolus tube feeding regimen.  Question accuracy of today's weight. Last weight on 07/20/22 was 57.1 kg and had been stable compared to admission weight. Today's weight documented as 99.1 kg. Suspect bed scale error.  Pt with mild pitting edema to LUE and RLE.  Current TF: Glucerna 1.5 @ 45 ml/hr, free water 200 ml q 6 hours  Admit weight: 55.8 kg Current weight: 99.1 kg (? accuracy)  Medications reviewed and include: SSI q 4 hours, IV protonix, miralax, senna  Labs reviewed. CBG's:  111-167 x 24 hours  UOP: 1625 ml x 24 hours  Diet Order:   Diet Order             Diet NPO time specified  Diet effective now                   EDUCATION NEEDS:   Not appropriate for education at this time  Skin:  Skin Assessment: Skin Integrity Issues: Stage II: sacrum Incisions: head  Last BM:  07/28/22 small type 5  Height:   Ht Readings from Last 1 Encounters:  07/08/22 5\' 3"  (1.6 m)    Weight:   Wt Readings from Last 1 Encounters:  07/31/22 99.1 kg    BMI:  Body mass index is 38.7 kg/m.  Estimated Nutritional Needs:   Kcal:  1450-1650  Protein:  75-90 grams  Fluid:  > 1.5 L/day    Gustavus Bryant, MS, RD, LDN Inpatient Clinical Dietitian Please see AMiON for contact information.

## 2022-07-31 NOTE — Progress Notes (Signed)
Speech Language Pathology Treatment: Cognitive-Linquistic  Patient Details Name: Gina Kelley MRN: [id number] DOB: March 26, 1936 Today's Date: 07/31/2022 Time: 8338-2505 SLP Time Calculation (min) (ACUTE ONLY): 10 min  Assessment / Plan / Recommendation Clinical Impression  Pt continues to have severe labial edema and minimal lingual movement. Pt can slightly protrude tongue (which also appears edematous) but cannot lateralize. She can phonate and shape "yeah" intelligibly. Otherwise pt needing to alternate methods of communication for wants and needs. SLP placed white board on her lap and midline and pt able to write dictated words legibly x8. Pt shook head no when offered ice chip trials for swallowing therapy. Agree that pts oral function still too impaired for therapeutic trials. Will f/u.   HPI HPI: Pt is an 87 year old female who presented to the ED on 1/9 as a Code Stroke after being found unconscious, nonverbal, with left hemiplegia. CT Head demonstrated right IPH with 60mm midline shift and right frontoparietal SDH. Taken to OR 1/9 for R craniotomy/SDH evacuation. Postoperative repeat CT Head 1/10 demonstrated resolution of SDH post-crani and some residual hemorrhage without evidence of mass effect.  CT head 1/12 with Similar size of residual intraparenchymal hemorrhage centered at the right frontal operculum, but with mildly increased localized edema. Mass effect with approximately 5 mm of right-to-left shift, mildly increased from prior. Increased prominence of small focal hypodensity involving the right basal ganglia, suspicious for an evolving ischemic infarct. Abnormal vasogenic edema involving the anterior right frontal lobe, concerning for underlying tumor/mass.  ETT 1/9-1/15; 1/15-18; 1/18-123 (1-way extubation). Palliative care consulted to assist with [name] in the setting of multiple failed extubations. Cortrak placed 1/15. PMH: HTN, HLD, CVA (1998 with residual LLE numbness), T2DM,  osteopenia.      SLP Plan  Continue with current plan of care      Recommendations for follow up therapy are one component of a multi-disciplinary discharge planning process, led by the attending physician.  Recommendations may be updated based on patient status, additional functional criteria and insurance authorization.    Recommendations                   Plan: Continue with current plan of care           [name], [name]  07/31/2022, 11:09 AM

## 2022-07-31 NOTE — Progress Notes (Signed)
Cortrak not patent at 1500. Reinaldo Meeker, NP states to leave Cortrak clamped and pause all TF since PEG placement is tomorrow at 1000 and pt NPO @ midnight. Morning dose of Protonix and Colace held.

## 2022-07-31 NOTE — Consult Note (Signed)
Consult Note  Gina Kelley 1936/05/24  [id number].    Requesting MD: Dr. Annette Stable Chief Complaint/Reason for Consult: PEG placement  HPI:  87 y.o. female with medical history significant for T2DM, HTN, GERD, HLD, T2DM, anemia, CVA in 1980s who presented to [facility] ED via EMS on 1/9 with signs/symptoms of stroke. She was found to have a right SDH and intraparenchymal hemorrhage vs hemorrhagic metastatic disease with subdural extension. She was taken to OR 1/9 for craniotomy and evacuation as well as biopsy by Dr. Annette Stable. Biopsy was negative for malignancy.  She was initially intubated and sedated but has since been extubated and remained with good respiratory function on room air. She has ongoing left sided deficits and has been working with therapies including speech but continues to have impaired oral function.  Trauma service consulted for PEG placement. Patient able to communicate with head movements and hand gestures. She makes okay sign with her right hand when asked how she is feeling and nods her head when asked if she would like to proceed with PEG placement as well as draws the word yes with right hand.  No abdominal surgeries per chart review. Family bedside   ROS: Review of Systems  Unable to perform ROS: Medical condition    Family History  Problem Relation Age of Onset   Stroke Mother    Diabetes Mother    Hypertension Mother    Anuerysm Father 82       Cerebral   Asthma Sister    Breast cancer Sister    Pulmonary disease Brother        Black lung   Stroke Sister    Cirrhosis Brother    Hypertension Son    Asthma Brother     Past Medical History:  Diagnosis Date   Anemia of chronic disease 07/08/2006   Antral ulcer 02/23/2007   Seen on EGD in 2008, small ulcer with erosion    Cerebral vascular accident ([place]) 04/14/2006   1998, left lower extremity numbness, no residual deficits    Essential hypertension 04/14/2006   Gastroesophageal reflux disease  02/18/2013   Occasional, symptomatically relieved with peptobismol    Hyperlipidemia LDL goal < 100 04/14/2006   Hypertensive retinopathy of both eyes, grade 1 05/15/2016   Osteopenia of right femoral neck 10/20/2016   DEXA (10/16/2016): R femur T -2.5 (FRAX tool calculates at -2.4), L1-L4 spine T -0.9, 10 year risk for: Major osteoporotic fracture 8.3%, Hip fracture 2.7%   Type 2 diabetes mellitus with stage 1 chronic kidney disease, without long-term current use of insulin ([place])    Type II diabetes mellitus ([place]) 04/14/2006    Past Surgical History:  Procedure Laterality Date   COLONOSCOPY     CRANIOTOMY Right 07/08/2022   Procedure: RIGHT CRANIOTOMY HEMATOMA EVACUATION SUBDURAL;  Surgeon: Earnie Larsson, MD;  Location: [facility];  Service: Neurosurgery;  Laterality: Right;   ESOPHAGOGASTRODUODENOSCOPY      Social History:  reports that she quit smoking about 44 years ago. She has never used smokeless tobacco. She reports that she does not drink alcohol and does not use drugs.  Allergies:  Allergies  Allergen Reactions   Enalapril Cough    Medications Prior to Admission  Medication Sig Dispense Refill   amLODipine (NORVASC) 5 MG tablet Take 1 tablet (5 mg total) by mouth daily. 90 tablet 3   aspirin 81 MG chewable tablet Chew 81 mg by mouth daily.     atorvastatin (LIPITOR) 80 MG  tablet Take 1 tablet by mouth once daily (Patient taking differently: Take 80 mg by mouth daily.) 90 tablet 3   Cholecalciferol (VITAMIN D3) 75 MCG (3000 UT) TABS Take 1 tablet by mouth daily. (Patient taking differently: Take 3,000 Units by mouth daily.) 30 tablet    ezetimibe (ZETIA) 10 MG tablet Take 1 tablet (10 mg total) by mouth daily. 90 tablet 3   losartan (COZAAR) 25 MG tablet Take 1 tablet by mouth once daily 90 tablet 3   Multiple Vitamins-Minerals (PRESERVISION AREDS PO) Take 1 tablet by mouth in the morning and at bedtime.     alendronate (FOSAMAX) 70 MG tablet TAKE ONE TABLET BY MOUTH EVERY 7 DAYS.  TAKE WITH A FULL GLASS OF WATER ON AN EMPTY STOMACH (Patient not taking: Reported on 07/09/2022) 12 tablet 0    Blood pressure (!) 141/80, pulse 90, temperature 99.2 F (37.3 C), temperature source Oral, resp. rate 16, height 5\' 3"  (1.6 m), weight 99.1 kg, SpO2 92 %. Physical Exam: General: pleasant, WD, female who is laying in bed in NAD HEENT: head is normocephalic, atraumatic. Lips edematous Heart: regular, rate, and rhythm. Lungs: Respiratory effort unlabored on room air Abd: soft, NT, ND, +BS, no masses, hernias, or organomegaly. No surgical scars MSK: all 4 extremities are symmetrical with no cyanosis, clubbing, or edema. Skin: warm and dry    Results for orders placed or performed during the hospital encounter of 07/08/22 (from the past 48 hour(s))  Glucose, capillary     Status: Abnormal   Collection Time: 07/29/22 11:10 AM  Result Value Ref Range   Glucose-Capillary 138 (H) 70 - 99 mg/dL    Comment: Glucose reference range applies only to samples taken after fasting for at least 8 hours.  Glucose, capillary     Status: Abnormal   Collection Time: 07/29/22  3:20 PM  Result Value Ref Range   Glucose-Capillary 182 (H) 70 - 99 mg/dL    Comment: Glucose reference range applies only to samples taken after fasting for at least 8 hours.  Glucose, capillary     Status: Abnormal   Collection Time: 07/29/22  7:31 PM  Result Value Ref Range   Glucose-Capillary 149 (H) 70 - 99 mg/dL    Comment: Glucose reference range applies only to samples taken after fasting for at least 8 hours.  Glucose, capillary     Status: Abnormal   Collection Time: 07/29/22 11:15 PM  Result Value Ref Range   Glucose-Capillary 136 (H) 70 - 99 mg/dL    Comment: Glucose reference range applies only to samples taken after fasting for at least 8 hours.  Glucose, capillary     Status: Abnormal   Collection Time: 07/30/22  3:24 AM  Result Value Ref Range   Glucose-Capillary 125 (H) 70 - 99 mg/dL    Comment:  Glucose reference range applies only to samples taken after fasting for at least 8 hours.  Glucose, capillary     Status: Abnormal   Collection Time: 07/30/22  7:51 AM  Result Value Ref Range   Glucose-Capillary 128 (H) 70 - 99 mg/dL    Comment: Glucose reference range applies only to samples taken after fasting for at least 8 hours.  Glucose, capillary     Status: Abnormal   Collection Time: 07/30/22 11:31 AM  Result Value Ref Range   Glucose-Capillary 121 (H) 70 - 99 mg/dL    Comment: Glucose reference range applies only to samples taken after fasting for at least 8 hours.  Glucose, capillary     Status: Abnormal   Collection Time: 07/30/22  3:10 PM  Result Value Ref Range   Glucose-Capillary 159 (H) 70 - 99 mg/dL    Comment: Glucose reference range applies only to samples taken after fasting for at least 8 hours.  Glucose, capillary     Status: Abnormal   Collection Time: 07/30/22  7:44 PM  Result Value Ref Range   Glucose-Capillary 167 (H) 70 - 99 mg/dL    Comment: Glucose reference range applies only to samples taken after fasting for at least 8 hours.  Glucose, capillary     Status: Abnormal   Collection Time: 07/30/22 11:10 PM  Result Value Ref Range   Glucose-Capillary 129 (H) 70 - 99 mg/dL    Comment: Glucose reference range applies only to samples taken after fasting for at least 8 hours.  Glucose, capillary     Status: Abnormal   Collection Time: 07/31/22  3:54 AM  Result Value Ref Range   Glucose-Capillary 133 (H) 70 - 99 mg/dL    Comment: Glucose reference range applies only to samples taken after fasting for at least 8 hours.  Glucose, capillary     Status: Abnormal   Collection Time: 07/31/22  7:51 AM  Result Value Ref Range   Glucose-Capillary 111 (H) 70 - 99 mg/dL    Comment: Glucose reference range applies only to samples taken after fasting for at least 8 hours.   No results found.    Assessment/Plan PEG placement, ICH and SDH with oral deficits  -  patient has been treated and evaluated for SDH and ICH now with residual oral deficits following with SLP and deemed appropriate for PEG placement - patient not on anticoagulation - discussed with patient and patient family and agreed to proceed - plan for PEG placement tomorrow 2/2 in endoscopy - hold Tfs at midnight - discussed with bedside RN   I discussed with family - daughters Gina Kelley and Gina Kelley and husband Gina Kelley at bedside, daughter Gina Kelley and son Gina Kelley via speaker phone) - including indications and risks. Risk includes but are not limited to anesthesia, bleeding, pain, infection, scarring, and PEG tube complications (leakage, skin irritation, dislodgement and possible need for exchanges). Discussed that PEG tube will need to stay in place for at least 6-8 weeks but can stay in place long term if needed. Please hold TF's at midnight prior to the procedure.  FEN: hold Tfs at MN ID: [email or web address] periop VTE: SCDs   I reviewed last 24 h vitals and pain scores, last 48 h intake and output, last 24 h labs and trends, and last 24 h imaging results.   Winferd Humphrey, [facility] Surgery 07/31/2022, 10:33 AM Please see Amion for pager number during day hours 7:00am-4:30pm

## 2022-07-31 NOTE — Progress Notes (Signed)
Physical Therapy Treatment Patient Details Name: Gina Kelley MRN: [id number] DOB: 01-Nov-1935 Today's Date: 07/31/2022   History of Present Illness Pt is an 87 year old woman admitted on 1/9 with stroke like symptoms. Found to have R IPH and R SDH, underwent craniotomy same day. Hospital course complicated by respiratory failure and failed extubation x 2. Palliative extubation 1/23 with pt able to maintain airway and improved neurologic status. PMH: HTN, HLD, DM2, CVA with residual L LE numbness, osteopenia.    PT Comments    Pt is participative and progressing steadily toward goals and well from admission.  Emphasis on warm up, rolling and transitions for peri care and to EOB, scooting, sitting balance  and to/from side lying bilaterally to midline, sit to stand for pericare x3 and transfer to the chair.  07/31/2022  Ginger Carne., PT Acute Rehabilitation Services [phone]  (office)  Recommendations for follow up therapy are one component of a multi-disciplinary discharge planning process, led by the attending physician.  Recommendations may be updated based on patient status, additional functional criteria and insurance authorization.  Follow Up Recommendations  Acute inpatient rehab (3hours/day)     Assistance Recommended at Discharge Intermittent Supervision/Assistance  Patient can return home with the following A lot of help with walking and/or transfers;A lot of help with bathing/dressing/bathroom;Assistance with cooking/housework;Assistance with feeding;Direct supervision/assist for medications management;Direct supervision/assist for financial management;Assist for transportation;Help with stairs or ramp for entrance   Equipment Recommendations  Hospital bed    Recommendations for Other Services Rehab consult     Precautions / Restrictions Precautions Precautions: Fall Precaution Comments: coretrack     Mobility  Bed Mobility Overal bed mobility: Needs Assistance Bed  Mobility: Supine to Sit Rolling: Mod assist Sidelying to sit: Mod assist, Max assist, +2 for safety/equipment       General bed mobility comments: pt initiated assist in prepping for roll L, needing truncal /LE assis to roll, and truncal/LE assist up from L elbow and R hand to sitting EOB.  Pt needing scooting assist.    Transfers Overall transfer level: Needs assistance Equipment used: 1 person hand held assist Transfers: Sit to/from Stand Sit to Stand: Max assist, +2 safety/equipment (x4 over the course of the session)     Squat pivot transfers: Max assist, +2 safety/equipment     General transfer comment: pt followed commands/cues well to sequence the transfer to stand and pivot to the chair.    Ambulation/Gait               General Gait Details: unable   Stairs             Wheelchair Mobility    Modified Rankin (Stroke Patients Only) Modified Rankin (Stroke Patients Only) Pre-Morbid Rankin Score: No symptoms Modified Rankin: Severe disability     Balance Overall balance assessment: Needs assistance Sitting-balance support: Single extremity supported, Feet supported Sitting balance-Leahy Scale: Poor Sitting balance - Comments: moments of minguard assist but overall needs minimal assist     Standing balance-Leahy Scale: Zero Standing balance comment: pt had a difficult time standing up on her feet for a full peri care session                            Cognition Arousal/Alertness: Awake/alert Behavior During Therapy: Flat affect Overall Cognitive Status: Difficult to assess  General Comments: followed all commands; vocalized "yes"  communicating by thumbs up/down;  "okay" sign,        Exercises Other Exercises Other Exercises: warm up bil hip/knee flex/ext ROM with graded resistance x10 reps Other Exercises: bicep tricep presses to R UE with graded resistance x10 reps bil    General  Comments General comments (skin integrity, edema, etc.): vss      Pertinent Vitals/Pain Pain Assessment Pain Assessment: Faces Faces Pain Scale: Hurts little more Pain Location: R facial swelling and neck; R knee Pain Descriptors / Indicators: Grimacing Pain Intervention(s): Monitored during session    Home Living                          Prior Function            PT Goals (current goals can now be found in the care plan section) Acute Rehab PT Goals Patient Stated Goal: pt agrees to getting better, independent PT Goal Formulation: Patient unable to participate in goal setting Time For Goal Achievement: 08/09/22 Potential to Achieve Goals: Good Progress towards PT goals: Progressing toward goals    Frequency    Min 4X/week      PT Plan Current plan remains appropriate    Co-evaluation              AM-PAC PT "6 Clicks" Mobility   Outcome Measure  Help needed turning from your back to your side while in a flat bed without using bedrails?: A Lot Help needed moving from lying on your back to sitting on the side of a flat bed without using bedrails?: A Lot Help needed moving to and from a bed to a chair (including a wheelchair)?: Total Help needed standing up from a chair using your arms (e.g., wheelchair or bedside chair)?: Total Help needed to walk in hospital room?: Total Help needed climbing 3-5 steps with a railing? : Total 6 Click Score: 8    End of Session Equipment Utilized During Treatment: Oxygen Activity Tolerance: Patient tolerated treatment well;Patient limited by fatigue Patient left: in chair;with call bell/phone within reach;with chair alarm set;with family/visitor present Nurse Communication: Mobility status;Need for lift equipment PT Visit Diagnosis: Other abnormalities of gait and mobility (R26.89);Other symptoms and signs involving the nervous system (R29.898);Muscle weakness (generalized) (M62.81)     Time: 3244-0102 PT Time  Calculation (min) (ACUTE ONLY): 38 min  Charges:  $Therapeutic Exercise: 8-22 mins $Therapeutic Activity: 8-22 mins $Neuromuscular Re-education: 8-22 mins                     07/31/2022  Ginger Carne., PT Acute Rehabilitation Services [phone]  (office)   Tessie Fass Franny Selvage 07/31/2022, 6:35 PM

## 2022-07-31 NOTE — Progress Notes (Addendum)
Inpatient Rehab Admissions Coordinator:   Awaiting determination from [facility] regarding CIR prior auth request.  Will follow.   1328: At bedside during PT session.  Pt does excellent.  Follows directions, interactive, and motivated.  Daughter present at bedside and involved in session as well.  We further discussed likely expectations for d/c from rehab to be min to mod assist, w/c level and certain 33/2 assist. We also further discussed insurance barriers to [name] admit following CIR.  Daughter reiterates that pt will return home with support from multiple children, spouse, and friends.    Shann Medal, PT, DPT Admissions Coordinator [phone] 07/31/22  9:47 AM

## 2022-07-31 NOTE — Progress Notes (Signed)
   Providing Compassionate, Quality Care - Together   Subjective: Patient reports right knee pain. No other issues. Swelling much improved.  Objective: Vital signs in last 24 hours: Temp:  [97.6 F (36.4 C)-99.2 F (37.3 C)] 99.2 F (37.3 C) (02/01 0752) Pulse Rate:  [86-106] 90 (02/01 0752) Resp:  [14-20] 16 (02/01 0752) BP: (127-141)/(76-85) 141/80 (02/01 0752) SpO2:  [91 %-94 %] 92 % (02/01 0752) Weight:  [99.1 kg] 99.1 kg (02/01 0500)  Intake/Output from previous day: 01/31 0701 - 02/01 0700 In: -  Out: 1625 [Urine:1625] Intake/Output this shift: No intake/output data recorded.  Responds to voice, opens eyes spontaneously PERRLA, right gaze preference Follows commands RUE and RLE Flickers to pain LUE, subtle purposeful movement LLE Lips swollen Tachypneic; on room air Craniotomy site is clean, dry, and intact  Lab Results: No results for input(s): "WBC", "HGB", "HCT", "PLT" in the last 72 hours. BMET No results for input(s): "NA", "K", "CL", "CO2", "GLUCOSE", "BUN", "CREATININE", "CALCIUM" in the last 72 hours.  Studies/Results: No results found.  Assessment/Plan: Patient is twenty-three days status post craniotomy for evacuation of SDH and ICH by Dr. Annette Stable. Her neuro exam continues to improve. Pathology negative for malignant process. Specimen consistent with thrombus/hematoma. Patient failed extubation 07/14/2022. CCM again trialed extubation on 07/17/2022, but patient required reintubation. Patient was extubated on 07/22/2022 and is maintaining her airway.    LOS: 23 days   -Trauma service consulted for possible PEG placement. -Lidocaine patch ordered for right knee. -Continue supportive efforts.   [name], DNP, AGNP-C Nurse Practitioner  [facility] [place] [address], [address], [place], [place] [address] P: [phone]    F: [phone]  07/31/2022, 11:26 AM

## 2022-08-01 ENCOUNTER — Inpatient Hospital Stay (HOSPITAL_COMMUNITY): Payer: Medicare HMO | Admitting: Certified Registered"

## 2022-08-01 ENCOUNTER — Encounter (HOSPITAL_COMMUNITY)
Admission: EM | Disposition: A | Discharge status: Rehabilitation Facility | Payer: Self-pay | Source: Home / Self Care | Attending: Neurosurgery

## 2022-08-01 ENCOUNTER — Encounter (HOSPITAL_COMMUNITY): Payer: Self-pay | Admitting: Physical Medicine and Rehabilitation

## 2022-08-01 DIAGNOSIS — E119 Type 2 diabetes mellitus without complications: Secondary | ICD-10-CM

## 2022-08-01 DIAGNOSIS — Z87891 Personal history of nicotine dependence: Secondary | ICD-10-CM

## 2022-08-01 DIAGNOSIS — I10 Essential (primary) hypertension: Secondary | ICD-10-CM

## 2022-08-01 DIAGNOSIS — Z4659 Encounter for fitting and adjustment of other gastrointestinal appliance and device: Secondary | ICD-10-CM

## 2022-08-01 DIAGNOSIS — D649 Anemia, unspecified: Secondary | ICD-10-CM

## 2022-08-01 HISTORY — PX: PEG PLACEMENT: SHX5437

## 2022-08-01 HISTORY — PX: ESOPHAGOGASTRODUODENOSCOPY (EGD) WITH PROPOFOL: SHX5813

## 2022-08-01 LAB — CBC
HCT: 25.1 % — ABNORMAL LOW (ref 36.0–46.0)
Hemoglobin: 8.1 g/dL — ABNORMAL LOW (ref 12.0–15.0)
MCH: 28.9 pg (ref 26.0–34.0)
MCHC: 32.3 g/dL (ref 30.0–36.0)
MCV: 89.6 fL (ref 80.0–100.0)
Platelets: 495 10*3/uL — ABNORMAL HIGH (ref 150–400)
RBC: 2.8 MIL/uL — ABNORMAL LOW (ref 3.87–5.11)
RDW: 16.7 % — ABNORMAL HIGH (ref 11.5–15.5)
WBC: 14.6 10*3/uL — ABNORMAL HIGH (ref 4.0–10.5)
nRBC: 0 % (ref 0.0–0.2)

## 2022-08-01 LAB — BASIC METABOLIC PANEL
Anion gap: 13 (ref 5–15)
BUN: 13 mg/dL (ref 8–23)
CO2: 25 mmol/L (ref 22–32)
Calcium: 8.6 mg/dL — ABNORMAL LOW (ref 8.9–10.3)
Chloride: 102 mmol/L (ref 98–111)
Creatinine, Ser: 0.57 mg/dL (ref 0.44–1.00)
GFR, Estimated: >60 mL/min (ref >60)
Glucose, Bld: 71 mg/dL (ref 70–99)
Potassium: 3.3 mmol/L — ABNORMAL LOW (ref 3.5–5.1)
Sodium: 140 mmol/L (ref 135–145)

## 2022-08-01 LAB — GLUCOSE, CAPILLARY
Glucose-Capillary: 90 mg/dL (ref 70–99)
Glucose-Capillary: 68 mg/dL — ABNORMAL LOW (ref 70–99)
Glucose-Capillary: 80 mg/dL (ref 70–99)
Glucose-Capillary: 91 mg/dL (ref 70–99)
Glucose-Capillary: 78 mg/dL (ref 70–99)
Glucose-Capillary: 66 mg/dL — ABNORMAL LOW (ref 70–99)
Glucose-Capillary: 71 mg/dL (ref 70–99)
Glucose-Capillary: 109 mg/dL — ABNORMAL HIGH (ref 70–99)
Glucose-Capillary: 153 mg/dL — ABNORMAL HIGH (ref 70–99)

## 2022-08-01 SURGERY — ESOPHAGOGASTRODUODENOSCOPY (EGD) WITH PROPOFOL
Anesthesia: Monitor Anesthesia Care

## 2022-08-01 MED ORDER — SODIUM CHLORIDE 0.9 % IV SOLN: INTRAVENOUS | Status: DC | PRN

## 2022-08-01 MED ORDER — GLUCAGON HCL RDNA (DIAGNOSTIC) 1 MG IJ SOLR
INTRAMUSCULAR | Status: AC
  Filled 2022-08-01: qty 1

## 2022-08-01 MED ORDER — PROMETHAZINE HCL 12.5 MG PO TABS: 12.5000 mg | ORAL_TABLET | ORAL | 0 refills | Status: DC | PRN

## 2022-08-01 MED ORDER — PANTOPRAZOLE SODIUM 40 MG IV SOLR: 40.0000 mg | Freq: Every day | INTRAVENOUS | Status: DC

## 2022-08-01 MED ORDER — POLYETHYLENE GLYCOL 3350 17 G PO PACK: 17.0000 g | PACK | Freq: Every day | ORAL | 0 refills | Status: DC

## 2022-08-01 MED ORDER — DIPHENHYDRAMINE HCL 12.5 MG/5ML PO ELIX: 12.5000 mg | ORAL_SOLUTION | Freq: Three times a day (TID) | ORAL | 0 refills | Status: DC | PRN

## 2022-08-01 MED ORDER — FENTANYL CITRATE (PF) 100 MCG/2ML IJ SOLN
INTRAMUSCULAR | Status: DC | PRN
  Administered 2022-08-01 (×2): 25 ug via INTRAVENOUS

## 2022-08-01 MED ORDER — BISACODYL 10 MG RE SUPP: 10.0000 mg | Freq: Every day | RECTAL | 0 refills | Status: DC | PRN

## 2022-08-01 MED ORDER — LIDOCAINE 5 % EX PTCH: 1.0000 | MEDICATED_PATCH | CUTANEOUS | 0 refills | Status: DC

## 2022-08-01 MED ORDER — INSULIN ASPART 100 UNIT/ML IJ SOLN: 0.0000 [IU] | INTRAMUSCULAR | 11 refills | Status: DC

## 2022-08-01 MED ORDER — ONDANSETRON HCL 4 MG PO TABS: 4.0000 mg | ORAL_TABLET | ORAL | 0 refills | Status: DC | PRN

## 2022-08-01 MED ORDER — PROPOFOL 500 MG/50ML IV EMUL
INTRAVENOUS | Status: DC | PRN
  Administered 2022-08-01: 125 ug/kg/min via INTRAVENOUS

## 2022-08-01 MED ORDER — FREE WATER: 200.0000 mL | Freq: Four times a day (QID) | Status: DC

## 2022-08-01 MED ORDER — ACETAMINOPHEN 650 MG RE SUPP: 650.0000 mg | RECTAL | 0 refills | Status: DC | PRN

## 2022-08-01 MED ORDER — ONDANSETRON HCL 4 MG/2ML IJ SOLN
INTRAMUSCULAR | Status: DC | PRN
  Administered 2022-08-01: 4 mg via INTRAVENOUS

## 2022-08-01 MED ORDER — WHITE PETROLATUM EX OINT: 1.0000 | TOPICAL_OINTMENT | CUTANEOUS | 0 refills | Status: DC | PRN

## 2022-08-01 MED ORDER — GLUCERNA 1.5 CAL PO LIQD: 1000.0000 mL | ORAL | Status: DC

## 2022-08-01 MED ORDER — LEVETIRACETAM 100 MG/ML PO SOLN: 500.0000 mg | Freq: Two times a day (BID) | ORAL | 12 refills | Status: DC

## 2022-08-01 NOTE — Progress Notes (Signed)
OT Cancellation Note  Patient Details Name: Gina Kelley MRN: [id number] DOB: 1935/10/10   Cancelled Treatment:    Reason Eval/Treat Not Completed: Other (comment) (Attmepted to see earlier this am. Pt having peg placed, then had low blodd sugars adn nsg requesting to hold. Pt apparently being admitted to AIR.)  [facility] 08/01/2022, 4:55 PM Maurie Boettcher, OT/L   Acute OT Clinical Specialist Acute Rehabilitation Services Pager [phone] Office [phone]

## 2022-08-01 NOTE — Progress Notes (Signed)
Inpatient Rehab Admissions Coordinator:   Spoke to Dr. Tressa Busman.  Will plan to hold admission till tomorrow AM for medical reasons.  Rehab MD (Dr. Tressa Busman) to assess pt and confirm admission on Saturday.  Floor RN can call CIR at [phone] for report after 12pm on Saturday.  I have let pt/family and case manager know.    Shann Medal, PT, DPT Admissions Coordinator [phone] 08/01/22  3:16 PM

## 2022-08-01 NOTE — Progress Notes (Signed)
Rechecked blood sugar and it was 80. Will continue to monitor.

## 2022-08-01 NOTE — H&P (Incomplete)
Physical Medicine and Rehabilitation Admission H&P   CC: Functional deficits secondary to subdural hematoma with intracranial hemorrhage status post right frontotemporoparietal craniotomy  HPI: Gina Kelley is an 87 year old female who presented to the emergency department on 07/08/2022 due to strokelike symptoms.  Her family noticed confusion, difficulty with speech, facial droop and left-sided weakness.  They called EMS and she was noted to have an initial systolic blood pressure of 270.  CT of the head showed large acute intraparenchymal hematoma with associated subdural hematoma.  There was mass effect with 12 mm of midline shift.  Trauma history was elicited.  Neurology and neurosurgery were consulted.  She was started on Cleviprex infusion and Decadron 10 mg x 1 dose.  She was intubated for airway control.  Nasogastric tube was placed.  She was taken to the operating room where she underwent right frontotemporoparietal craniotomy and evacuation of intracerebral hemorrhage and right convexity subdural hematoma by Dr. Trenton Gammon.  Biopsy of suspicious tissue was obtained.  It was negative for malignant process.  Postoperative CT of the head on 1/10 demonstrated resolution of SDH postcraniotomy and some residual hemorrhage without evidence of mass effect.  She was started on hypertonic saline on 1/13.  She was extubated on 1/15.  Later that day her blood gases were significant for hypercapnia with respiratory rate of 32 she was reintubated.  Her left side remains plegic.  She again failed extubation on 1/18 and required reintubation.  Foley to care consultation was obtained on 1/19.  Family decided on DNR CODE STATUS.  On 1/23 she was tolerating pressure support and was extubated. On EGD with percutaneous endoscopic gastrostomy tube placement on 2/2 by Dr. Bobbye Morton. The patient requires inpatient physical medicine and rehabilitation evaluations and treatment secondary to dysfunction due to intracranial  hemorrhage, subdural hematoma status postcraniotomy.  ROS Past Medical History:  Diagnosis Date   Anemia of chronic disease 07/08/2006   Antral ulcer 02/23/2007   Seen on EGD in 2008, small ulcer with erosion    Cerebral vascular accident ([place]) 04/14/2006   1998, left lower extremity numbness, no residual deficits    Essential hypertension 04/14/2006   Gastroesophageal reflux disease 02/18/2013   Occasional, symptomatically relieved with peptobismol    Hyperlipidemia LDL goal < 100 04/14/2006   Hypertensive retinopathy of both eyes, grade 1 05/15/2016   Osteopenia of right femoral neck 10/20/2016   DEXA (10/16/2016): R femur T -2.5 (FRAX tool calculates at -2.4), L1-L4 spine T -0.9, 10 year risk for: Major osteoporotic fracture 8.3%, Hip fracture 2.7%   Type 2 diabetes mellitus with stage 1 chronic kidney disease, without long-term current use of insulin ([place])    Type II diabetes mellitus ([place]) 04/14/2006   Past Surgical History:  Procedure Laterality Date   COLONOSCOPY     CRANIOTOMY Right 07/08/2022   Procedure: RIGHT CRANIOTOMY HEMATOMA EVACUATION SUBDURAL;  Surgeon: Earnie Larsson, MD;  Location: [place];  Service: Neurosurgery;  Laterality: Right;   ESOPHAGOGASTRODUODENOSCOPY     Family History  Problem Relation Age of Onset   Stroke Mother    Diabetes Mother    Hypertension Mother    Anuerysm Father 73       Cerebral   Asthma Sister    Breast cancer Sister    Pulmonary disease Brother        Black lung   Stroke Sister    Cirrhosis Brother    Hypertension Son    Asthma Brother    Social History:  reports that  she quit smoking about 44 years ago. She has never used smokeless tobacco. She reports that she does not drink alcohol and does not use drugs. Allergies:  Allergies  Allergen Reactions   Enalapril Cough   Chlorhexidine Swelling    Possible irritation from CHG, eye tongue and lip swelling   Medications Prior to Admission  Medication Sig Dispense Refill   amLODipine  (NORVASC) 5 MG tablet Take 1 tablet (5 mg total) by mouth daily. 90 tablet 3   aspirin 81 MG chewable tablet Chew 81 mg by mouth daily.     atorvastatin (LIPITOR) 80 MG tablet Take 1 tablet by mouth once daily (Patient taking differently: Take 80 mg by mouth daily.) 90 tablet 3   Cholecalciferol (VITAMIN D3) 75 MCG (3000 UT) TABS Take 1 tablet by mouth daily. (Patient taking differently: Take 3,000 Units by mouth daily.) 30 tablet    ezetimibe (ZETIA) 10 MG tablet Take 1 tablet (10 mg total) by mouth daily. 90 tablet 3   losartan (COZAAR) 25 MG tablet Take 1 tablet by mouth once daily 90 tablet 3   Multiple Vitamins-Minerals (PRESERVISION AREDS PO) Take 1 tablet by mouth in the morning and at bedtime.     alendronate (FOSAMAX) 70 MG tablet TAKE ONE TABLET BY MOUTH EVERY 7 DAYS. TAKE WITH A FULL GLASS OF WATER ON AN EMPTY STOMACH (Patient not taking: Reported on 07/09/2022) 12 tablet 0      Home: Home Living Family/patient expects to be discharged to:: Private residence Living Arrangements: Spouse/significant other Available Help at Discharge: Family, Available 24 hours/day Type of Home: House Home Access: Stairs to enter [employer] of Steps: 4 Home Layout: One level Bathroom Shower/Tub: Chiropodist: Standard Home Equipment: None   Functional History: Prior Function Prior Level of Function : Independent/Modified Independent, Driving  Functional Status:  Mobility: Bed Mobility Overal bed mobility: Needs Assistance Bed Mobility: Supine to Sit Rolling: Mod assist Sidelying to sit: Mod assist, Max assist, +2 for safety/equipment Supine to sit: Max assist, HOB elevated Sit to supine: Max assist Sit to sidelying: +2 for physical assistance, Mod assist General bed mobility comments: pt initiated assist in prepping for roll L, needing truncal /LE assis to roll, and truncal/LE assist up from L elbow and R hand to sitting EOB.  Pt needing scooting  assist. Transfers Overall transfer level: Needs assistance Equipment used: 1 person hand held assist Transfers: Sit to/from Stand Sit to Stand: Max assist, +2 safety/equipment (x4 over the course of the session) Bed to/from chair/wheelchair/BSC transfer type:: Squat pivot Stand pivot transfers: Max assist Squat pivot transfers: Max assist, +2 safety/equipment General transfer comment: pt followed commands/cues well to sequence the transfer to stand and pivot to the chair. Ambulation/Gait General Gait Details: unable    ADL: ADL Overall ADL's : Needs assistance/impaired Eating/Feeding: NPO Grooming: Maximal assistance Grooming Details (indicate cue type and reason): washing face Upper Body Bathing: Maximal assistance Upper Body Bathing Details (indicate cue type and reason): assisting with washing her upper body Lower Body Bathing: Total assistance Upper Body Dressing : Total assistance Lower Body Dressing: Total assistance General ADL Comments: dependent  Cognition: Cognition Overall Cognitive Status: Difficult to assess Arousal/Alertness: Awake/alert Orientation Level: Other (comment) (not answering questions) Attention: Focused, Sustained Focused Attention: Appears intact Sustained Attention: Impaired Sustained Attention Impairment: Verbal basic, Functional basic Cognition Arousal/Alertness: Awake/alert Behavior During Therapy: Flat affect Overall Cognitive Status: Difficult to assess General Comments: followed all commands; vocalized "yes"  communicating by thumbs up/down;  "  okay" sign, Difficult to assess due to: Impaired communication  Physical Exam: Blood pressure 137/76, pulse 91, temperature 98.1 F (36.7 C), temperature source Axillary, resp. rate 15, height 5\' 3"  (1.6 m), weight 97.3 kg, SpO2 94 %. Physical Exam  Results for orders placed or performed during the hospital encounter of 07/08/22 (from the past 48 hour(s))  Glucose, capillary     Status:  Abnormal   Collection Time: 07/30/22  3:10 PM  Result Value Ref Range   Glucose-Capillary 159 (H) 70 - 99 mg/dL    Comment: Glucose reference range applies only to samples taken after fasting for at least 8 hours.  Glucose, capillary     Status: Abnormal   Collection Time: 07/30/22  7:44 PM  Result Value Ref Range   Glucose-Capillary 167 (H) 70 - 99 mg/dL    Comment: Glucose reference range applies only to samples taken after fasting for at least 8 hours.  Glucose, capillary     Status: Abnormal   Collection Time: 07/30/22 11:10 PM  Result Value Ref Range   Glucose-Capillary 129 (H) 70 - 99 mg/dL    Comment: Glucose reference range applies only to samples taken after fasting for at least 8 hours.  Glucose, capillary     Status: Abnormal   Collection Time: 07/31/22  3:54 AM  Result Value Ref Range   Glucose-Capillary 133 (H) 70 - 99 mg/dL    Comment: Glucose reference range applies only to samples taken after fasting for at least 8 hours.  Glucose, capillary     Status: Abnormal   Collection Time: 07/31/22  7:51 AM  Result Value Ref Range   Glucose-Capillary 111 (H) 70 - 99 mg/dL    Comment: Glucose reference range applies only to samples taken after fasting for at least 8 hours.  Glucose, capillary     Status: Abnormal   Collection Time: 07/31/22 11:40 AM  Result Value Ref Range   Glucose-Capillary 150 (H) 70 - 99 mg/dL    Comment: Glucose reference range applies only to samples taken after fasting for at least 8 hours.  Glucose, capillary     Status: Abnormal   Collection Time: 07/31/22  3:26 PM  Result Value Ref Range   Glucose-Capillary 151 (H) 70 - 99 mg/dL    Comment: Glucose reference range applies only to samples taken after fasting for at least 8 hours.  Glucose, capillary     Status: Abnormal   Collection Time: 07/31/22  7:55 PM  Result Value Ref Range   Glucose-Capillary 110 (H) 70 - 99 mg/dL    Comment: Glucose reference range applies only to samples taken after  fasting for at least 8 hours.  Glucose, capillary     Status: None   Collection Time: 07/31/22 11:05 PM  Result Value Ref Range   Glucose-Capillary 95 70 - 99 mg/dL    Comment: Glucose reference range applies only to samples taken after fasting for at least 8 hours.  Glucose, capillary     Status: None   Collection Time: 08/01/22  3:10 AM  Result Value Ref Range   Glucose-Capillary 90 70 - 99 mg/dL    Comment: Glucose reference range applies only to samples taken after fasting for at least 8 hours.  Glucose, capillary     Status: Abnormal   Collection Time: 08/01/22  7:51 AM  Result Value Ref Range   Glucose-Capillary 68 (L) 70 - 99 mg/dL    Comment: Glucose reference range applies only to samples taken after  fasting for at least 8 hours.  Glucose, capillary     Status: None   Collection Time: 08/01/22  8:08 AM  Result Value Ref Range   Glucose-Capillary 80 70 - 99 mg/dL    Comment: Glucose reference range applies only to samples taken after fasting for at least 8 hours.  Glucose, capillary     Status: None   Collection Time: 08/01/22 11:04 AM  Result Value Ref Range   Glucose-Capillary 91 70 - 99 mg/dL    Comment: Glucose reference range applies only to samples taken after fasting for at least 8 hours.  Glucose, capillary     Status: None   Collection Time: 08/01/22 11:49 AM  Result Value Ref Range   Glucose-Capillary 78 70 - 99 mg/dL    Comment: Glucose reference range applies only to samples taken after fasting for at least 8 hours.   No results found.    Blood pressure 137/76, pulse 91, temperature 98.1 F (36.7 C), temperature source Axillary, resp. rate 15, height 5\' 3"  (1.6 m), weight 97.3 kg, SpO2 94 %.  Medical Problem List and Plan: 1. Functional deficits secondary to subdural hematoma and ICH  -patient may *** shower  -ELOS/Goals: ***  2.  Antithrombotics: -DVT/anticoagulation:  Mechanical:  Antiembolism stockings, knee (TED hose) Bilateral lower  extremities  -antiplatelet therapy: none  3. Pain Management: Tylenol as needed  4. Mood/Behavior/Sleep: LCSW to evaluate and provide emotional support  -antipsychotic agents: n/a  5. Neuropsych/cognition: This patient is not capable of making decisions on her own behalf.  6. Skin/Wound Care: Routine skin care checks   7. Fluids/Electrolytes/Nutrition: Routine Is and Os and follow-up chemistries  -NPO; has PEG  -continue Glucerna 1.5 continuous TFs  8: Hypertension: monitor TID and prn  -continue amlodipine 10 mg daily  -losartan 50 mg daily  9: Hyperlipidemia: continue statin, Zetia  10: SDH/ICH s/p crani: continue Keppra  11: Leukocytosis: current temperature 99.3, ceftazidime completed 1/29  -Follow-up CBC with differential  12: Anemia, multifactorial: Hemoglobin with slow trend upward  -Follow-up CBC     ***  2/29, PA-C 08/01/2022

## 2022-08-01 NOTE — Procedures (Addendum)
   Procedure Note  Date: 08/01/2022  Procedure: esophagogastroduodenoscopy (EGD) and percutaneous endoscopic gastrostomy (PEG) tube placement  Pre-op diagnosis: dysphagia Post-op diagnosis: same  Indication and clinical history: Gina Kelley with dysphagia  Surgeon: Jesusita Oka, MD Assistant: Donald Pore, PA  Anesthesia: MAC  Findings:  Specimen: none EBL: <5cc Drains/Implants: PEG tube, 3cm at the skin   Disposition: ICU/PACU  Description of Procedure: The patient was positioned semi-recumbent. Time-out was performed verifying correct patient, procedure, signature of informed consent, and pre-operative antibiotics as indicated. MAC induction was uneventful and a bite block was placed into the oropharynx. The endoscope was inserted into the oropharynx and advanced down the esophagus into the stomach and into the duodenum. The visualized esophagus and duodenum were unremarkable. The endoscope was retracted back into the stomach and the stomach was insufflated. The stomach was inspected and was also normal. Transillumination was performed. The light was visible on the external skin and dimpling of the stomach was noted endoscopically with manual pressure. The abdomen was prepped and draped in the usual sterile fashion. Transillumination and dimpling were repeated and local anesthetic was infiltrated to make a skin wheal at the site of transillumination. The needle was inserted perpendicularly to the skin and the tip of the needle was visualized endoscopically. As the needle was retracted, the tract was also anesthetized. A skin nick was made at the site of the wheal and an introducer needle and sheath were inserted. The needle was removed and guidewire inserted. The guidewire was grasped by an endoscopic snare and the snare, guidewire, and endoscope retracted out of the oropharynx. The PEG tube was secured to the guidewire and retracted through the mouth and esophagus into the stomach. The PEG tube was  secured with a bolster and was visualized endoscopically to spin freely circumferentially and also be without gaps between the internal bumper and the stomach wall. There was no evidence of bleeding. The PEG bolster was secured at 3cm at the skin and there were no gaps between the bolster and the abdominal wall. The stomach was desufflated endoscopically and the endoscope removed. The bite block was also removed. The patient tolerated the procedure well and there were no complications.   The patient may have water and medications administered via the PEG tube beginning immediately and tube feeds may be initiated four hours post-procedure.    Jesusita Oka, MD General and Bone [facility] Surgery

## 2022-08-01 NOTE — Progress Notes (Signed)
PT Cancellation Note  Patient Details Name: Gina Kelley MRN: [id number] DOB: 1936/06/30   Cancelled Treatment:    Reason Eval/Treat Not Completed: Patient declined, no reason specified.  Pt's family declined for the pt today.  Pt d/cing to AIR later. 08/01/2022  Ginger Carne., PT Acute Rehabilitation Services [phone]  (office)   Tessie Fass Deyana Wnuk 08/01/2022, 2:16 PM

## 2022-08-01 NOTE — Progress Notes (Signed)
Inpatient Rehab Admissions Coordinator:    I have insurance approval and a bed available for pt to admit to CIR today. [name], NP, in agreement.  Will let pt/family and TOC team know.   Shann Medal, PT, DPT Admissions Coordinator [phone] 08/01/22  1:04 PM

## 2022-08-01 NOTE — Progress Notes (Signed)
Trauma/Critical Care Follow Up Note  Subjective:    Overnight Issues:   Objective:  Vital signs for last 24 hours: Temp:  [98.5 F (36.9 C)-98.9 F (37.2 C)] 98.6 F (37 C) (02/02 0752) Pulse Rate:  [78-92] 79 (02/02 0752) Resp:  [12-20] 12 (02/02 0752) BP: (120-143)/(64-71) 132/66 (02/02 0752) SpO2:  [92 %-97 %] 97 % (02/02 0752) Weight:  [97.3 kg] 97.3 kg (02/02 0500)  Hemodynamic parameters for last 24 hours:    Intake/Output from previous day: 02/01 0701 - 02/02 0700 In: -  Out: 2000 [Urine:2000]  Intake/Output this shift: No intake/output data recorded.  Vent settings for last 24 hours:    Physical Exam:  Gen: comfortable, no distress Neuro: non-focal exam HEENT: PERRL Neck: supple CV: RRR Pulm: unlabored breathing Abd: soft, NT GU: clear yellow urine Extr: wwp, no edema   Results for orders placed or performed during the hospital encounter of 07/08/22 (from the past 24 hour(s))  Glucose, capillary     Status: Abnormal   Collection Time: 07/31/22 11:40 AM  Result Value Ref Range   Glucose-Capillary 150 (H) 70 - 99 mg/dL  Glucose, capillary     Status: Abnormal   Collection Time: 07/31/22  3:26 PM  Result Value Ref Range   Glucose-Capillary 151 (H) 70 - 99 mg/dL  Glucose, capillary     Status: Abnormal   Collection Time: 07/31/22  7:55 PM  Result Value Ref Range   Glucose-Capillary 110 (H) 70 - 99 mg/dL  Glucose, capillary     Status: None   Collection Time: 07/31/22 11:05 PM  Result Value Ref Range   Glucose-Capillary 95 70 - 99 mg/dL  Glucose, capillary     Status: None   Collection Time: 08/01/22  3:10 AM  Result Value Ref Range   Glucose-Capillary 90 70 - 99 mg/dL  Glucose, capillary     Status: Abnormal   Collection Time: 08/01/22  7:51 AM  Result Value Ref Range   Glucose-Capillary 68 (L) 70 - 99 mg/dL  Glucose, capillary     Status: None   Collection Time: 08/01/22  8:08 AM  Result Value Ref Range   Glucose-Capillary 80 70 - 99  mg/dL    Assessment & Plan: The plan of care was discussed with the bedside nurse for the day, Barnett Applebaum, who is in agreement with this plan and no additional concerns were raised.   Present on Admission:  ICH (intracerebral hemorrhage) ([place])    LOS: 24 days   Additional comments:I reviewed the patient's new clinical lab test results.   and I reviewed the patients new imaging test results.    SDH/ICH  Dysphagia - plan for PEG today to allow [name] placement. Consent obtained from the patient's spouse via phone. Daughter also on the phone for this discussion. Clearly explained the possibility of deep sedation and indication for intubation, as well as the default for rescinding DNR status for the procedure. Spouse seems to have a poor understanding, but daughter made effort to clarify and he verbalized understanding. Spouse does report that he is only making decisions based on what his children decide, but when directly asked, does state that he believes his wife would want a PEG placed. I think this is an ethically gray area, but given the clinical scenario, will plan to proceed with PEG. If spouse is a figurehead for decision-making on behalf of the children, it may be reasonable to consider allowing spouse to rescind decision-making permanently, either verbally for the duration  of this hospitalization and/or in the form of a signed and notarized HC-POA. Daughter does expresses a desire to rescind DNR status. I do not agree with this, but rather than explore this further as a limited consultant, I have notified the primary team and will defer further discussion regarding this to the primary and palliative care teams.  FEN - TF off since MN Dispo - PEG in Endo today    Jesusita Oka, MD Trauma & General Surgery Please use [email or web address] to contact on call provider  08/01/2022  *Care during the described time interval was provided by me. I have reviewed this patient's available data, including medical  history, events of note, physical examination and test results as part of my evaluation.

## 2022-08-01 NOTE — Transfer of Care (Signed)
Immediate Anesthesia Transfer of Care Note  Patient: [name]  Procedure(s) Performed: ESOPHAGOGASTRODUODENOSCOPY (EGD) WITH PROPOFOL PERCUTANEOUS ENDOSCOPIC GASTROSTOMY (PEG) PLACEMENT  Patient Location: PACU  Anesthesia Type:MAC  Level of Consciousness: awake, alert , and oriented  Airway & Oxygen Therapy: Patient Spontanous Breathing  Post-op Assessment: Report given to RN and Post -op Vital signs reviewed and stable  Post vital signs: stable  Last Vitals:  Vitals Value Taken Time  BP 116/74 08/01/22 1105  Temp 36.8 C 08/01/22 1101  Pulse 88 08/01/22 1107  Resp 13 08/01/22 1107  SpO2 97 % 08/01/22 1107  Vitals shown include unvalidated device data.  Last Pain:  Vitals:   08/01/22 1101  TempSrc:   PainSc: Asleep         Complications: No notable events documented.

## 2022-08-01 NOTE — Anesthesia Preprocedure Evaluation (Signed)
Anesthesia Evaluation  Patient identified by MRN, date of birth, ID band  Reviewed: Allergy & Precautions, NPO status , Patient's Chart, lab work & pertinent test results  History of Anesthesia Complications Negative for: history of anesthetic complications  Airway Mallampati: III  TM Distance: >3 FB Neck ROM: Full    Dental  (+) Edentulous Upper, Dental Advisory Given   Pulmonary former smoker   breath sounds clear to auscultation       Cardiovascular hypertension, Pt. on medications  Rhythm:Regular     Neuro/Psych Stroke 06/2022, left sided weakness CVA, Residual Symptoms  negative psych ROS   GI/Hepatic PUD,GERD  ,,Reduced PO s/p stroke   Endo/Other  diabetes    Renal/GU Renal diseaseLab Results      Component                Value               Date                      CREATININE               0.79                07/19/2022             Lab Results      Component                Value               Date                      K                        4.1                 07/19/2022                Musculoskeletal negative musculoskeletal ROS (+)    Abdominal   Peds  Hematology  (+) Blood dyscrasia, anemia Lab Results      Component                Value               Date                      WBC                      14.8 (H)            07/19/2022                HGB                      8.0 (L)             07/19/2022                HCT                      25.0 (L)            07/19/2022                MCV                      91.6  07/19/2022                PLT                      315                 07/19/2022              Anesthesia Other Findings   Reproductive/Obstetrics                             Anesthesia Physical Anesthesia Plan  ASA: 3  Anesthesia Plan: MAC   Post-op Pain Management: Minimal or no pain anticipated   Induction: Intravenous  PONV Risk  Score and Plan: 2 and Propofol infusion and Treatment may vary due to age or medical condition  Airway Management Planned: Nasal Cannula and Natural Airway  Additional Equipment: None  Intra-op Plan:   Post-operative Plan:   Informed Consent: I have reviewed the patients History and Physical, chart, labs and discussed the procedure including the risks, benefits and alternatives for the proposed anesthesia with the patient or authorized representative who has indicated his/her understanding and acceptance.    Discussed DNR with power of attorney and Suspend DNR.   Consent reviewed with POA and Dental advisory given  Plan Discussed with: CRNA  Anesthesia Plan Comments:        Anesthesia Quick Evaluation

## 2022-08-01 NOTE — Discharge Summary (Addendum)
Physician Discharge Summary     Providing Compassionate, Quality Care - Together   Patient ID: Gina Kelley MRN: [id number] DOB/AGE: 85-Oct-1937 85 y.o.  Admit date: 07/08/2022 Discharge date: 08/01/2022  Admission Diagnoses: ICH, SDH  Discharge Diagnoses:  Principal Problem:   S/P craniotomy Active Problems:   ICH (intracerebral hemorrhage) (HCC)   Malnutrition of moderate degree   Ventilator dependence (HCC)   Acute respiratory failure with hypoxia (HCC)   DNR (do not resuscitate)   Pressure injury of skin   Discharged Condition: good  Hospital Course: Patient underwent a craniotomy for evacuation of a subdural hematoma and intracranial hemorrhage by Dr. Jordan Likes on 07/08/2022. She was admitted to the ICU following surgery. The patient failed extubation on 07/14/2022. CCM again trialed extubation on 07/17/2022, but patient required reintubation. Patient was extubated on 07/22/2022 and maintained her airway. She was transferred to the Progressive unit as her neuro exam improved. PEG was placed by Dr. Bedelia Person on 08/01/2022. The patient has worked with both physical and occupational therapies who feel the patient is ready for discharge to [facility] CIR.   Consults: pulmonary/intensive care, PT/OT, TOC  Significant Diagnostic Studies: radiology: DG Abd Portable 1V  Result Date: 07/25/2022 CLINICAL DATA:  Constipation EXAM: PORTABLE ABDOMEN - 1 VIEW COMPARISON:  07/14/2022 FINDINGS: Feeding tube tip overlies the stomach. There is no evidence of bowel obstruction. Mild-to-moderate stool burden. Probable calcified uterine fibroid overlying the pelvis. IMPRESSION: No evidence of bowel obstruction.  Mild-to-moderate stool burden. Electronically Signed   By: Caprice Renshaw M.D.   On: 07/25/2022 12:33   DG Abd Portable 1V  Result Date: 07/14/2022 CLINICAL DATA:  Meaning tube placement EXAM: PORTABLE ABDOMEN - 1 VIEW COMPARISON:  Portable exam 1643 hours compared to 07/10/2022 FINDINGS: Tip of  feeding tube projects over distal gastric antrum. Nonobstructive bowel gas pattern. Atelectasis versus infiltrate at lung bases greater on LEFT. Atherosclerotic calcifications aorta. IMPRESSION: Tip of feeding tube projects over distal gastric antrum. Electronically Signed   By: Ulyses Southward M.D.   On: 07/14/2022 17:07   DG CHEST PORT 1 VIEW  Result Date: 07/14/2022 CLINICAL DATA:  Intubation EXAM: PORTABLE CHEST 1 VIEW COMPARISON:  Portable exam 1642 hours compared to 07/13/2022 FINDINGS: Tip of endotracheal tube projects 2.8 cm above carina. Feeding tube extends into stomach. Normal heart size, mediastinal contours, and pulmonary vascularity. Atherosclerotic calcification aorta. Bibasilar opacities question atelectasis or infiltrate. Remaining lungs clear. No pleural effusion or pneumothorax. Bones demineralized. IMPRESSION: Mild bibasilar opacities question atelectasis versus infiltrate, new. Aortic Atherosclerosis (ICD10-I70.0). Electronically Signed   By: Ulyses Southward M.D.   On: 07/14/2022 17:06   DG CHEST PORT 1 VIEW  Result Date: 07/13/2022 CLINICAL DATA:  Ventilated EXAM: PORTABLE CHEST 1 VIEW COMPARISON:  Prior chest x-ray 07/08/2022 FINDINGS: The patient is intubated. The tip of the endotracheal tube is 4 cm above the carina. A gastric tube is present. The tip lies off the field of view, below the diaphragm and likely in the stomach. Cardiac and mediastinal contours are unchanged. Atherosclerotic calcifications present in the transverse aorta. The lungs are essentially clear. Mild chronic bronchitic changes and interstitial prominence. No pleural effusion or pneumothorax. No acute osseous abnormality. IMPRESSION: 1. Endotracheal tube 4 cm above the carina. 2. Gastric tube in place with the tip below the diaphragm and likely within the stomach. 3. No acute cardiopulmonary process. Electronically Signed   By: Malachy Moan M.D.   On: 07/13/2022 08:39   CT HEAD WO CONTRAST ( )  Result Date:  07/11/2022 CLINICAL DATA:  Follow-up examination for subdural hematoma. EXAM: CT HEAD WITHOUT CONTRAST TECHNIQUE: Contiguous axial images were obtained from the base of the skull through the vertex without intravenous contrast. RADIATION DOSE REDUCTION: This exam was performed according to the departmental dose-optimization program which includes automated exposure control, adjustment of the mA and/or kV according to patient size and/or use of iterative reconstruction technique. COMPARISON:  Prior CT from 07/09/2022. FINDINGS: Brain: Postoperative changes from right-sided craniotomy for subdural and intraparenchymal hematoma evacuation. Decreased pneumocephalus from previous. Residual extra-axial hemorrhage overlying the right frontotemporal convexity measures up to 8 mm in thickness, similar to prior. Residual intraparenchymal hemorrhage centered at the right frontal operculum is not significantly changed in size or morphology. Increased localized edema within this region from previous. Mass effect with approximately 5 mm of right-to-left shift, mildly increased from prior. No hydrocephalus or trapping. Basilar cisterns remain patent. Increased prominence of focal hypodensity involving the right basal ganglia at the genu of the internal capsule, suspicious for an evolving ischemic infarct (series 7, image 17). No other visible large vessel territory infarct. Small remote perforator infarct at the left basal ganglia noted. Abnormal vasogenic edema involving the anterior right frontal lobe again seen, again concerning for underlying tumor/mass. Vascular: No abnormal hyperdense vessel. Calcified atherosclerosis present at the skull base. Skull: Post craniotomy changes on the right.  No adverse features. Sinuses/Orbits: Globes orbital soft tissues demonstrate no acute finding. Visualized paranasal sinuses remain largely clear. No significant mastoid effusion. Other: None. IMPRESSION: 1. Postoperative changes from  right-sided craniotomy for subdural and intraparenchymal hematoma evacuation. Residual extra-axial hemorrhage overlying the right frontotemporal convexity measures up to 8 mm in thickness, similar to prior. 2. Similar size of residual intraparenchymal hemorrhage centered at the right frontal operculum, but with mildly increased localized edema. Mass effect with approximately 5 mm of right-to-left shift, mildly increased from prior. No hydrocephalus or trapping. 3. Increased prominence of small focal hypodensity involving the right basal ganglia, suspicious for an evolving ischemic infarct. 4. Abnormal vasogenic edema involving the anterior right frontal lobe, again concerning for underlying tumor/mass. Electronically Signed   By: Jeannine Boga M.D.   On: 07/11/2022 00:04   DG Abd Portable 1V  Result Date: 07/10/2022 CLINICAL DATA:  Placement of enteric tube EXAM: PORTABLE ABDOMEN - 1 VIEW COMPARISON:  None Available. FINDINGS: Tip of enteric tube is seen in the antrum of the stomach. Bowel gas pattern is nonspecific. There is possible calcified fibroid in pelvis. Arterial calcifications are seen. IMPRESSION: Tip of enteric tube is seen in the antrum of the stomach. Electronically Signed   By: Elmer Picker M.D.   On: 07/10/2022 14:58   EEG adult  Result Date: 07/10/2022 Lora Havens, MD     07/10/2022  2:32 PM Patient Name: Gina Kelley MRN: [id number] Epilepsy Attending: Lora Havens Referring Physician/Provider: Gleason, Otilio Carpen, PA-C Date: 07/10/2022 Duration: 22.55 mins Patient history: 87 year old female with right frontoparietal ICH with SDH status post craniotomy.  EEG to evaluate for seizure. Level of alertness: comatose AEDs during EEG study: LEV, propofol Technical aspects: This EEG study was done with scalp electrodes positioned according to the 10-20 International system of electrode placement. Electrical activity was reviewed with band pass filter of 1-70Hz , sensitivity  of 7 uV/mm, display speed of 2mm/sec with a 60Hz  notched filter applied as appropriate. EEG data were recorded continuously and digitally stored.  Video monitoring was available and reviewed as appropriate. Description: EEG showed continuous generalized and maximal right frontotemporal  region 3-6 Hz theta- delta slowing.  Sharp transients were noted in right frontal region. Hyperventilation and photic stimulation were not performed.   ABNORMALITY -Continuous slow, generalized and maximal right frontotemporal region IMPRESSION: This study is suggestive of cortical dysfunction in right frontotemporal region likely secondary to underlying structural abnormality.  Additionally there is moderate to severe diffuse encephalopathy, nonspecific etiology.  No seizures or definite epileptiform discharges were seen throughout the recording. Charlsie Questriyanka O Yadav   CT HEAD WO CONTRAST  Result Date: 07/09/2022 CLINICAL DATA:  Follow-up examination for intracranial hemorrhage. EXAM: CT HEAD WITHOUT CONTRAST TECHNIQUE: Contiguous axial images were obtained from the base of the skull through the vertex without intravenous contrast. RADIATION DOSE REDUCTION: This exam was performed according to the departmental dose-optimization program which includes automated exposure control, adjustment of the mA and/or kV according to patient size and/or use of iterative reconstruction technique. COMPARISON:  Prior CT from 07/08/2022. FINDINGS: Brain: Postoperative changes from interval right-sided craniotomy for subdural and intraparenchymal hematoma evacuation. Scattered postoperative pneumocephalus, most pronounced overlying the anterior frontal convexities bilaterally. Previously identified hematoma has been partially evacuated. Residual bleed measures approximately 4.1 x 2.2 x 2.8 cm. Residual extra-axial hemorrhage measures up to 8 mm in thickness. Improved mass effect and right-to-left shift, now measuring 3 mm. No hydrocephalus or  trapping. Basilar cisterns remain patent. Abnormal hypodensity seen involving the adjacent anterior right frontal lobe, concerning for vasogenic edema (series 2, image 10). Findings raise the concern for an underlying mass lesion at this location. No other visible mass lesion. No acute large vessel territory infarct. Underlying chronic microvascular ischemic disease with remote left basal ganglia lacunar infarct noted. Vascular: No abnormal hyperdense vessel. Calcified atherosclerosis present about the skull base. Skull: Sequelae of interval right-sided craniotomy. Bone flap well-positioned. Skin staples remain in place. Sinuses/Orbits: Globes orbital soft tissues within normal limits. Paranasal sinuses are largely clear. No mastoid effusion. Other: None. IMPRESSION: 1. Postoperative changes from interval right-sided craniotomy for subdural and intraparenchymal hematoma evacuation. Residual parenchymal bleed measures approximately 4.1 x 2.2 x 2.8 cm. Residual extra-axial collection measures up to 8 mm in thickness. Improved mass effect and right-to-left shift, now measuring 3 mm. No hydrocephalus or trapping. 2. Abnormal hypodensity involving the adjacent anterior right frontal lobe, concerning for vasogenic edema, and raising the concern for an underlying mass lesion at this location. Further evaluation with dedicated MRI, with and without contrast, suggested for further evaluation. 3. No other acute intracranial abnormality. Electronically Signed   By: Rise MuBenjamin  McClintock M.D.   On: 07/09/2022 04:27   DG Chest Portable 1 View  Result Date: 07/08/2022 CLINICAL DATA:  Interval intubation and enteric tube placement EXAM: PORTABLE CHEST 1 VIEW COMPARISON:  None Available. FINDINGS: Lines/tubes: Endotracheal tube tip projects 2.8 cm above the carina. Enteric tube tip projects over the stomach with side hole 2 cm below the gastroesophageal junction. Chest: Lungs are clear without focal consolidation. Pleura: No  pneumothorax or pleural effusion. Heart/mediastinum: The heart size and mediastinal contours are within normal limits. Bones: No acute osseous abnormality. IMPRESSION: 1. Endotracheal tube tip projects 2.8 cm above the carina. 2. Enteric tube tip projects over the stomach with side hole 2 cm below the gastroesophageal junction. Consider advancing by 3 cm for more optimal positioning. Electronically Signed   By: Agustin CreeLimin  Xu M.D.   On: 07/08/2022 15:54   CT HEAD CODE STROKE WO CONTRAST  Addendum Date: 07/08/2022   ADDENDUM REPORT: 07/08/2022 15:45 Electronically Signed   By: Elwyn ReachWalter  Wiggins M.D  On: 07/08/2022 15:45   Addendum Date: 07/08/2022   ADDENDUM REPORT: 07/08/2022 15:45 Electronically Signed   By: Emmit Alexanders M.D   On: 07/08/2022 15:45   Result Date: 07/08/2022 CLINICAL DATA:  Code stroke. EXAM: CT HEAD WITHOUT CONTRAST TECHNIQUE: Contiguous axial images were obtained from the base of the skull through the vertex without intravenous contrast. RADIATION DOSE REDUCTION: This exam was performed according to the departmental dose-optimization program which includes automated exposure control, adjustment of the mA and/or kV according to patient size and/or use of iterative reconstruction technique. COMPARISON:  None Available. FINDINGS: Brain: Acute intraparenchymal hematoma in the right frontal operculum measuring up to 40 by 39 by 41 mm. No intraventricular extension. Associated overlying subdural hemorrhage measuring up to 10 mm in thickness along the right frontoparietal convexity. Associated 12 mm of leftward midline shift. Vascular: No hyperdense vessel. Skull: Normal. Negative for fracture or focal lesion. Sinuses/Orbits: No acute finding. Other: None. IMPRESSION: Large acute intraparenchymal hematoma within the right frontal operculum with associated right frontoparietal subdural hematoma and 12 mm of leftward midline shift. No intraventricular extension. Critical Value/emergent results were called  by telephone at the time of interpretation on 07/08/2022 at 3:30 pm to provider [facility], who verbally acknowledged these results. Electronically Signed: By: Emmit Alexanders M.D On: 07/08/2022 15:33     Treatments: surgery: Right frontotemporoparietal craniotomy and evacuation of intracerebral hemorrhage and right convexity subdural hematoma   Discharge Exam: Blood pressure 137/76, pulse 91, temperature 98.1 F (36.7 C), temperature source Axillary, resp. rate 15, height 5\' 3"  (1.6 m), weight 97.3 kg, SpO2 94 %.  Responds to voice, opens eyes spontaneously PERRLA, right gaze preference Follows commands RUE and RLE Flickers to pain LUE, subtle purposeful movement LLE Lips swollen No respiratory distress; on room air Craniotomy site is clean, dry, and intact  Disposition: Discharge disposition: [place] Not Defined        Allergies as of 08/01/2022       Reactions   Enalapril Cough   Chlorhexidine Swelling   Possible irritation from CHG, eye tongue and lip swelling        Medication List     STOP taking these medications    alendronate 70 MG tablet Commonly known as: FOSAMAX       TAKE these medications    acetaminophen 650 MG suppository Commonly known as: TYLENOL Place 1 suppository (650 mg total) rectally every 4 (four) hours as needed for mild pain (temp > 100.5).   amLODipine 5 MG tablet Commonly known as: NORVASC Take 1 tablet (5 mg total) by mouth daily.   aspirin 81 MG chewable tablet Chew 81 mg by mouth daily.   atorvastatin 80 MG tablet Commonly known as: LIPITOR Take 1 tablet by mouth once daily   bisacodyl 10 MG suppository Commonly known as: DULCOLAX Place 1 suppository (10 mg total) rectally daily as needed for moderate constipation.   diphenhydrAMINE 12.5 MG/5ML elixir Commonly known as: BENADRYL Place 5 mLs (12.5 mg total) into feeding tube every 8 (eight) hours as needed for allergies (Swelling).   ezetimibe  10 MG tablet Commonly known as: ZETIA Take 1 tablet (10 mg total) by mouth daily.   feeding supplement (GLUCERNA 1.5 CAL) Liqd Place 1,000 mLs into feeding tube continuous.   free water Soln Place 200 mLs into feeding tube every 6 (six) hours.   insulin aspart 100 UNIT/ML injection Commonly known as: novoLOG Inject 0-15 Units into the skin every 4 (four) hours.  lidocaine 5 % Commonly known as: LIDODERM Place 1 patch onto the skin daily. Remove & Discard patch within 12 hours or as directed by MD Start taking on: August 02, 2022   losartan 25 MG tablet Commonly known as: COZAAR Take 1 tablet by mouth once daily   ondansetron 4 MG tablet Commonly known as: ZOFRAN Take 1 tablet (4 mg total) by mouth every 4 (four) hours as needed for nausea or vomiting.   pantoprazole 40 MG injection Commonly known as: PROTONIX Inject 40 mg into the vein at bedtime.   polyethylene glycol 17 g packet Commonly known as: MIRALAX / GLYCOLAX Place 17 g into feeding tube daily. Start taking on: August 02, 2022   PRESERVISION AREDS PO Take 1 tablet by mouth in the morning and at bedtime.   promethazine 12.5 MG tablet Commonly known as: PHENERGAN Take 1-2 tablets (12.5-25 mg total) by mouth every 4 (four) hours as needed for refractory nausea / vomiting.   Vitamin D3 75 MCG (3000 UT) Tabs Take 1 tablet by mouth daily. What changed: how much to take   white petrolatum Oint Commonly known as: VASELINE Apply 1 Application topically as needed for lip care.         Signed: [name], DNP, AGNP-C Nurse Practitioner  [facility] [place] [address], [place], [place] [address] P: [phone]    F: [phone]  08/01/2022, 1:02 PM

## 2022-08-01 NOTE — TOC Transition Note (Signed)
Transition of Care [facility]) - CM/SW Discharge Note   Patient Details  Name: Gina Kelley MRN: [id number] Date of Birth: [birth year]  [facility] CM/SW Contact:  Verdell Carmine, RN Phone Number: 08/01/2022, 1:07 PM   Clinical Narrative:     Patient has been accepted to CIR. NO further DC needs  Final next level of care: IP Rehab Facility Barriers to Discharge: No Barriers Identified   Patient Goals and CMS Choice      Discharge Placement                         Discharge Plan and Services Additional resources added to the After Visit Summary for                                       Social Determinants of Health (SDOH) Interventions SDOH Screenings   Depression (PHQ2-9): Low Risk  (11/15/2021)  Tobacco Use: Medium Risk (07/31/2022)     Readmission Risk Interventions     No data to display

## 2022-08-01 NOTE — Progress Notes (Signed)
Labs drawn this evening. Paged MD as an FYI to review before she goes to rehab tomorrow.

## 2022-08-01 NOTE — Progress Notes (Addendum)
   Providing Compassionate, Quality Care - Together   Subjective: Patient resting comfortably this morning.  Objective: Vital signs in last 24 hours: Temp:  [98.5 F (36.9 C)-98.9 F (37.2 C)] 98.6 F (37 C) (02/02 0752) Pulse Rate:  [78-92] 79 (02/02 0752) Resp:  [12-20] 12 (02/02 0752) BP: (120-143)/(64-71) 132/66 (02/02 0752) SpO2:  [92 %-97 %] 97 % (02/02 0752) Weight:  [97.3 kg] 97.3 kg (02/02 0500)  Intake/Output from previous day: 02/01 0701 - 02/02 0700 In: -  Out: 2000 [Urine:2000] Intake/Output this shift: No intake/output data recorded.  Responds to voice, opens eyes spontaneously PERRLA, right gaze preference Follows commands RUE and RLE Flickers to pain LUE, subtle purposeful movement LLE Lips swollen No respiratory distress; on room air Craniotomy site is clean, dry, and intact  Lab Results: No results for input(s): "WBC", "HGB", "HCT", "PLT" in the last 72 hours. BMET No results for input(s): "NA", "K", "CL", "CO2", "GLUCOSE", "BUN", "CREATININE", "CALCIUM" in the last 72 hours.  Studies/Results: No results found.  Assessment/Plan: Patient is twenty-four days status post craniotomy for evacuation of SDH and ICH by Dr. Annette Stable. Her neuro exam continues to improve. Pathology negative for malignant process. Specimen consistent with thrombus/hematoma. Patient failed extubation 07/14/2022. CCM again trialed extubation on 07/17/2022, but patient required reintubation. Patient was extubated on 07/22/2022 and is maintaining her airway.     LOS: 24 days   -Plan is for PEG placement this morning. -Awaiting insurance approval for CIR. -Continue supportive efforts.   [name], DNP, AGNP-C Nurse Practitioner  [facility] [place] [address], [place], [place] [address] P: [phone]    F: [phone]  08/01/2022, 8:47 AM

## 2022-08-02 ENCOUNTER — Other Ambulatory Visit: Payer: Self-pay

## 2022-08-02 ENCOUNTER — Encounter (HOSPITAL_COMMUNITY): Payer: Self-pay | Admitting: Physical Medicine and Rehabilitation

## 2022-08-02 ENCOUNTER — Inpatient Hospital Stay (HOSPITAL_COMMUNITY)
Admission: RE | Admit: 2022-08-02 | Discharge: 2022-08-22 | DRG: 057 | Disposition: A | Discharge status: Skilled Nursing Facility | Payer: Medicare HMO | Source: Intra-hospital | Attending: Physical Medicine and Rehabilitation | Admitting: Physical Medicine and Rehabilitation

## 2022-08-02 DIAGNOSIS — R414 Neurologic neglect syndrome: Secondary | ICD-10-CM | POA: Diagnosis not present

## 2022-08-02 DIAGNOSIS — M79642 Pain in left hand: Secondary | ICD-10-CM | POA: Diagnosis not present

## 2022-08-02 DIAGNOSIS — M25561 Pain in right knee: Secondary | ICD-10-CM | POA: Diagnosis not present

## 2022-08-02 DIAGNOSIS — I69228 Other speech and language deficits following other nontraumatic intracranial hemorrhage: Secondary | ICD-10-CM

## 2022-08-02 DIAGNOSIS — Z803 Family history of malignant neoplasm of breast: Secondary | ICD-10-CM

## 2022-08-02 DIAGNOSIS — M6281 Muscle weakness (generalized): Secondary | ICD-10-CM | POA: Diagnosis not present

## 2022-08-02 DIAGNOSIS — T783XXA Angioneurotic edema, initial encounter: Secondary | ICD-10-CM | POA: Diagnosis not present

## 2022-08-02 DIAGNOSIS — D72829 Elevated white blood cell count, unspecified: Secondary | ICD-10-CM | POA: Diagnosis not present

## 2022-08-02 DIAGNOSIS — S065XAA Traumatic subdural hemorrhage with loss of consciousness status unknown, initial encounter: Secondary | ICD-10-CM | POA: Diagnosis present

## 2022-08-02 DIAGNOSIS — I10 Essential (primary) hypertension: Secondary | ICD-10-CM | POA: Diagnosis not present

## 2022-08-02 DIAGNOSIS — E785 Hyperlipidemia, unspecified: Secondary | ICD-10-CM | POA: Diagnosis present

## 2022-08-02 DIAGNOSIS — E1122 Type 2 diabetes mellitus with diabetic chronic kidney disease: Secondary | ICD-10-CM | POA: Diagnosis not present

## 2022-08-02 DIAGNOSIS — M25512 Pain in left shoulder: Secondary | ICD-10-CM | POA: Diagnosis not present

## 2022-08-02 DIAGNOSIS — Z6838 Body mass index (BMI) 38.0-38.9, adult: Secondary | ICD-10-CM | POA: Diagnosis not present

## 2022-08-02 DIAGNOSIS — Y92239 Unspecified place in hospital as the place of occurrence of the external cause: Secondary | ICD-10-CM | POA: Diagnosis not present

## 2022-08-02 DIAGNOSIS — Z9889 Other specified postprocedural states: Secondary | ICD-10-CM

## 2022-08-02 DIAGNOSIS — E876 Hypokalemia: Secondary | ICD-10-CM | POA: Diagnosis not present

## 2022-08-02 DIAGNOSIS — E440 Moderate protein-calorie malnutrition: Secondary | ICD-10-CM | POA: Diagnosis not present

## 2022-08-02 DIAGNOSIS — R000 Tachycardia, unspecified: Secondary | ICD-10-CM | POA: Diagnosis not present

## 2022-08-02 DIAGNOSIS — D631 Anemia in chronic kidney disease: Secondary | ICD-10-CM | POA: Diagnosis present

## 2022-08-02 DIAGNOSIS — Z8711 Personal history of peptic ulcer disease: Secondary | ICD-10-CM

## 2022-08-02 DIAGNOSIS — Z8249 Family history of ischemic heart disease and other diseases of the circulatory system: Secondary | ICD-10-CM

## 2022-08-02 DIAGNOSIS — I69291 Dysphagia following other nontraumatic intracranial hemorrhage: Secondary | ICD-10-CM

## 2022-08-02 DIAGNOSIS — R197 Diarrhea, unspecified: Secondary | ICD-10-CM | POA: Diagnosis not present

## 2022-08-02 DIAGNOSIS — M8580 Other specified disorders of bone density and structure, unspecified site: Secondary | ICD-10-CM | POA: Diagnosis present

## 2022-08-02 DIAGNOSIS — Z66 Do not resuscitate: Secondary | ICD-10-CM | POA: Diagnosis present

## 2022-08-02 DIAGNOSIS — R41841 Cognitive communication deficit: Secondary | ICD-10-CM | POA: Diagnosis not present

## 2022-08-02 DIAGNOSIS — M25562 Pain in left knee: Secondary | ICD-10-CM | POA: Diagnosis not present

## 2022-08-02 DIAGNOSIS — I69220 Aphasia following other nontraumatic intracranial hemorrhage: Secondary | ICD-10-CM | POA: Diagnosis present

## 2022-08-02 DIAGNOSIS — N181 Chronic kidney disease, stage 1: Secondary | ICD-10-CM | POA: Diagnosis present

## 2022-08-02 DIAGNOSIS — K591 Functional diarrhea: Secondary | ICD-10-CM | POA: Diagnosis not present

## 2022-08-02 DIAGNOSIS — S065XAD Traumatic subdural hemorrhage with loss of consciousness status unknown, subsequent encounter: Secondary | ICD-10-CM | POA: Diagnosis not present

## 2022-08-02 DIAGNOSIS — E441 Mild protein-calorie malnutrition: Secondary | ICD-10-CM | POA: Diagnosis not present

## 2022-08-02 DIAGNOSIS — J9811 Atelectasis: Secondary | ICD-10-CM | POA: Diagnosis not present

## 2022-08-02 DIAGNOSIS — Z823 Family history of stroke: Secondary | ICD-10-CM

## 2022-08-02 DIAGNOSIS — I69298 Other sequelae of other nontraumatic intracranial hemorrhage: Secondary | ICD-10-CM | POA: Diagnosis not present

## 2022-08-02 DIAGNOSIS — R252 Cramp and spasm: Secondary | ICD-10-CM | POA: Diagnosis present

## 2022-08-02 DIAGNOSIS — Z7984 Long term (current) use of oral hypoglycemic drugs: Secondary | ICD-10-CM

## 2022-08-02 DIAGNOSIS — R32 Unspecified urinary incontinence: Secondary | ICD-10-CM | POA: Diagnosis not present

## 2022-08-02 DIAGNOSIS — I69354 Hemiplegia and hemiparesis following cerebral infarction affecting left non-dominant side: Secondary | ICD-10-CM | POA: Diagnosis not present

## 2022-08-02 DIAGNOSIS — Z825 Family history of asthma and other chronic lower respiratory diseases: Secondary | ICD-10-CM

## 2022-08-02 DIAGNOSIS — I69222 Dysarthria following other nontraumatic intracranial hemorrhage: Secondary | ICD-10-CM

## 2022-08-02 DIAGNOSIS — R159 Full incontinence of feces: Secondary | ICD-10-CM | POA: Diagnosis not present

## 2022-08-02 DIAGNOSIS — K5900 Constipation, unspecified: Secondary | ICD-10-CM | POA: Diagnosis not present

## 2022-08-02 DIAGNOSIS — I129 Hypertensive chronic kidney disease with stage 1 through stage 4 chronic kidney disease, or unspecified chronic kidney disease: Secondary | ICD-10-CM | POA: Diagnosis present

## 2022-08-02 DIAGNOSIS — I69254 Hemiplegia and hemiparesis following other nontraumatic intracranial hemorrhage affecting left non-dominant side: Principal | ICD-10-CM

## 2022-08-02 DIAGNOSIS — D500 Iron deficiency anemia secondary to blood loss (chronic): Secondary | ICD-10-CM | POA: Diagnosis not present

## 2022-08-02 DIAGNOSIS — M7989 Other specified soft tissue disorders: Secondary | ICD-10-CM | POA: Diagnosis not present

## 2022-08-02 DIAGNOSIS — R1110 Vomiting, unspecified: Secondary | ICD-10-CM | POA: Diagnosis not present

## 2022-08-02 DIAGNOSIS — M25511 Pain in right shoulder: Secondary | ICD-10-CM | POA: Diagnosis not present

## 2022-08-02 DIAGNOSIS — R531 Weakness: Secondary | ICD-10-CM | POA: Diagnosis not present

## 2022-08-02 DIAGNOSIS — R471 Dysarthria and anarthria: Secondary | ICD-10-CM | POA: Diagnosis not present

## 2022-08-02 DIAGNOSIS — Z87891 Personal history of nicotine dependence: Secondary | ICD-10-CM

## 2022-08-02 DIAGNOSIS — F32A Depression, unspecified: Secondary | ICD-10-CM | POA: Diagnosis present

## 2022-08-02 DIAGNOSIS — R1312 Dysphagia, oropharyngeal phase: Secondary | ICD-10-CM | POA: Diagnosis not present

## 2022-08-02 DIAGNOSIS — F0631 Mood disorder due to known physiological condition with depressive features: Secondary | ICD-10-CM | POA: Diagnosis not present

## 2022-08-02 DIAGNOSIS — Z79899 Other long term (current) drug therapy: Secondary | ICD-10-CM | POA: Diagnosis not present

## 2022-08-02 DIAGNOSIS — K219 Gastro-esophageal reflux disease without esophagitis: Secondary | ICD-10-CM | POA: Diagnosis present

## 2022-08-02 DIAGNOSIS — R0989 Other specified symptoms and signs involving the circulatory and respiratory systems: Secondary | ICD-10-CM | POA: Diagnosis not present

## 2022-08-02 DIAGNOSIS — Z931 Gastrostomy status: Secondary | ICD-10-CM | POA: Diagnosis not present

## 2022-08-02 DIAGNOSIS — L89152 Pressure ulcer of sacral region, stage 2: Secondary | ICD-10-CM | POA: Diagnosis present

## 2022-08-02 DIAGNOSIS — Z833 Family history of diabetes mellitus: Secondary | ICD-10-CM

## 2022-08-02 DIAGNOSIS — T490X5A Adverse effect of local antifungal, anti-infective and anti-inflammatory drugs, initial encounter: Secondary | ICD-10-CM | POA: Diagnosis not present

## 2022-08-02 DIAGNOSIS — R258 Other abnormal involuntary movements: Secondary | ICD-10-CM | POA: Diagnosis present

## 2022-08-02 DIAGNOSIS — I69219 Unspecified symptoms and signs involving cognitive functions following other nontraumatic intracranial hemorrhage: Secondary | ICD-10-CM

## 2022-08-02 DIAGNOSIS — I82612 Acute embolism and thrombosis of superficial veins of left upper extremity: Secondary | ICD-10-CM | POA: Diagnosis not present

## 2022-08-02 DIAGNOSIS — I619 Nontraumatic intracerebral hemorrhage, unspecified: Secondary | ICD-10-CM | POA: Diagnosis not present

## 2022-08-02 DIAGNOSIS — Z7401 Bed confinement status: Secondary | ICD-10-CM | POA: Diagnosis not present

## 2022-08-02 DIAGNOSIS — T496X5A Adverse effect of otorhinolaryngological drugs and preparations, initial encounter: Secondary | ICD-10-CM | POA: Diagnosis not present

## 2022-08-02 DIAGNOSIS — I69292 Facial weakness following other nontraumatic intracranial hemorrhage: Secondary | ICD-10-CM

## 2022-08-02 LAB — CBC WITH DIFFERENTIAL/PLATELET
Abs Immature Granulocytes: 0.05 10*3/uL (ref 0.00–0.07)
Basophils Absolute: 0 10*3/uL (ref 0.0–0.1)
Basophils Relative: 0 %
Eosinophils Absolute: 0.2 10*3/uL (ref 0.0–0.5)
Eosinophils Relative: 2 %
HCT: 21.7 % — ABNORMAL LOW (ref 36.0–46.0)
Hemoglobin: 7.2 g/dL — ABNORMAL LOW (ref 12.0–15.0)
Immature Granulocytes: 0 %
Lymphocytes Relative: 9 %
Lymphs Abs: 1.1 10*3/uL (ref 0.7–4.0)
MCH: 29.4 pg (ref 26.0–34.0)
MCHC: 33.2 g/dL (ref 30.0–36.0)
MCV: 88.6 fL (ref 80.0–100.0)
Monocytes Absolute: 0.8 10*3/uL (ref 0.1–1.0)
Monocytes Relative: 7 %
Neutro Abs: 10.1 10*3/uL — ABNORMAL HIGH (ref 1.7–7.7)
Neutrophils Relative %: 82 %
Platelets: 402 10*3/uL — ABNORMAL HIGH (ref 150–400)
RBC: 2.45 MIL/uL — ABNORMAL LOW (ref 3.87–5.11)
RDW: 16.9 % — ABNORMAL HIGH (ref 11.5–15.5)
WBC: 12.3 10*3/uL — ABNORMAL HIGH (ref 4.0–10.5)
nRBC: 0 % (ref 0.0–0.2)

## 2022-08-02 LAB — COMPREHENSIVE METABOLIC PANEL
ALT: 125 U/L — ABNORMAL HIGH (ref 0–44)
AST: 101 U/L — ABNORMAL HIGH (ref 15–41)
Albumin: 1.9 g/dL — ABNORMAL LOW (ref 3.5–5.0)
Alkaline Phosphatase: 81 U/L (ref 38–126)
Anion gap: 9 (ref 5–15)
BUN: 20 mg/dL (ref 8–23)
CO2: 27 mmol/L (ref 22–32)
Calcium: 8.2 mg/dL — ABNORMAL LOW (ref 8.9–10.3)
Chloride: 99 mmol/L (ref 98–111)
Creatinine, Ser: 0.61 mg/dL (ref 0.44–1.00)
GFR, Estimated: >60 mL/min (ref >60)
Glucose, Bld: 151 mg/dL — ABNORMAL HIGH (ref 70–99)
Potassium: 3.7 mmol/L (ref 3.5–5.1)
Sodium: 135 mmol/L (ref 135–145)
Total Bilirubin: 0.2 mg/dL — ABNORMAL LOW (ref 0.3–1.2)
Total Protein: 6.1 g/dL — ABNORMAL LOW (ref 6.5–8.1)

## 2022-08-02 LAB — GLUCOSE, CAPILLARY
Glucose-Capillary: 135 mg/dL — ABNORMAL HIGH (ref 70–99)
Glucose-Capillary: 136 mg/dL — ABNORMAL HIGH (ref 70–99)
Glucose-Capillary: 114 mg/dL — ABNORMAL HIGH (ref 70–99)
Glucose-Capillary: 167 mg/dL — ABNORMAL HIGH (ref 70–99)
Glucose-Capillary: 149 mg/dL — ABNORMAL HIGH (ref 70–99)

## 2022-08-02 MED ORDER — METHOCARBAMOL 500 MG PO TABS: 500.0000 mg | ORAL_TABLET | Freq: Four times a day (QID) | ORAL | Status: DC | PRN

## 2022-08-02 MED ORDER — ACETAMINOPHEN 160 MG/5ML PO SOLN
325.0000 mg | ORAL | Status: DC | PRN
  Administered 2022-08-04: 350 mg
  Administered 2022-08-08 – 2022-08-10 (×2): 650 mg
  Filled 2022-08-02 (×3): qty 20.3

## 2022-08-02 MED ORDER — HYDRALAZINE HCL 10 MG PO TABS: 10.0000 mg | ORAL_TABLET | Freq: Three times a day (TID) | ORAL | Status: DC | PRN

## 2022-08-02 MED ORDER — PANTOPRAZOLE SODIUM 40 MG IV SOLR
40.0000 mg | Freq: Every day | INTRAVENOUS | Status: DC
  Administered 2022-08-02 – 2022-08-03 (×2): 40 mg via INTRAVENOUS
  Filled 2022-08-02 (×2): qty 10

## 2022-08-02 MED ORDER — DIPHENHYDRAMINE HCL 12.5 MG/5ML PO ELIX
12.5000 mg | ORAL_SOLUTION | Freq: Three times a day (TID) | ORAL | Status: DC | PRN
  Administered 2022-08-08: 25 mg
  Filled 2022-08-02: qty 10

## 2022-08-02 MED ORDER — POLYETHYLENE GLYCOL 3350 17 G PO PACK
17.0000 g | PACK | Freq: Every day | ORAL | Status: DC
  Administered 2022-08-03: 17 g
  Filled 2022-08-02 (×2): qty 1

## 2022-08-02 MED ORDER — TRAZODONE HCL 50 MG PO TABS: 25.0000 mg | ORAL_TABLET | Freq: Every evening | ORAL | Status: DC | PRN

## 2022-08-02 MED ORDER — AMLODIPINE BESYLATE 10 MG PO TABS
10.0000 mg | ORAL_TABLET | Freq: Every day | ORAL | Status: DC
  Administered 2022-08-03 – 2022-08-22 (×20): 10 mg
  Filled 2022-08-02 (×20): qty 1

## 2022-08-02 MED ORDER — ACETAMINOPHEN 325 MG PO TABS: 325.0000 mg | ORAL_TABLET | ORAL | Status: DC | PRN

## 2022-08-02 MED ORDER — SORBITOL 70 % SOLN: 30.0000 mL | Freq: Every day | Status: DC | PRN

## 2022-08-02 MED ORDER — INSULIN ASPART 100 UNIT/ML IJ SOLN
0.0000 [IU] | INTRAMUSCULAR | Status: DC
  Administered 2022-08-02: 2 [IU] via SUBCUTANEOUS
  Administered 2022-08-02: 3 [IU] via SUBCUTANEOUS
  Administered 2022-08-03: 2 [IU] via SUBCUTANEOUS
  Administered 2022-08-03: 3 [IU] via SUBCUTANEOUS
  Administered 2022-08-03 (×2): 2 [IU] via SUBCUTANEOUS
  Administered 2022-08-03: 3 [IU] via SUBCUTANEOUS
  Administered 2022-08-03: 2 [IU] via SUBCUTANEOUS
  Administered 2022-08-03 – 2022-08-04 (×2): 3 [IU] via SUBCUTANEOUS
  Administered 2022-08-04: 2 [IU] via SUBCUTANEOUS
  Administered 2022-08-04 – 2022-08-05 (×5): 3 [IU] via SUBCUTANEOUS
  Administered 2022-08-05: 2 [IU] via SUBCUTANEOUS
  Administered 2022-08-05: 3 [IU] via SUBCUTANEOUS
  Administered 2022-08-05 – 2022-08-06 (×5): 2 [IU] via SUBCUTANEOUS
  Administered 2022-08-06 (×2): 3 [IU] via SUBCUTANEOUS
  Administered 2022-08-06 – 2022-08-07 (×4): 2 [IU] via SUBCUTANEOUS
  Administered 2022-08-07 – 2022-08-08 (×3): 3 [IU] via SUBCUTANEOUS
  Administered 2022-08-08: 2 [IU] via SUBCUTANEOUS
  Administered 2022-08-08 (×2): 3 [IU] via SUBCUTANEOUS
  Administered 2022-08-08: 2 [IU] via SUBCUTANEOUS
  Administered 2022-08-09: 3 [IU] via SUBCUTANEOUS
  Administered 2022-08-09 (×3): 2 [IU] via SUBCUTANEOUS
  Administered 2022-08-09 (×2): 3 [IU] via SUBCUTANEOUS
  Administered 2022-08-10: 2 [IU] via SUBCUTANEOUS
  Administered 2022-08-10 (×2): 3 [IU] via SUBCUTANEOUS
  Administered 2022-08-10 – 2022-08-11 (×4): 2 [IU] via SUBCUTANEOUS
  Administered 2022-08-11: 3 [IU] via SUBCUTANEOUS
  Administered 2022-08-11: 2 [IU] via SUBCUTANEOUS
  Administered 2022-08-11: 3 [IU] via SUBCUTANEOUS
  Administered 2022-08-11: 2 [IU] via SUBCUTANEOUS
  Administered 2022-08-11 – 2022-08-12 (×2): 3 [IU] via SUBCUTANEOUS
  Administered 2022-08-12: 2 [IU] via SUBCUTANEOUS
  Administered 2022-08-12: 3 [IU] via SUBCUTANEOUS
  Administered 2022-08-12 (×2): 2 [IU] via SUBCUTANEOUS
  Administered 2022-08-13 (×2): 3 [IU] via SUBCUTANEOUS
  Administered 2022-08-13: 2 [IU] via SUBCUTANEOUS
  Administered 2022-08-13: 3 [IU] via SUBCUTANEOUS
  Administered 2022-08-13: 2 [IU] via SUBCUTANEOUS
  Administered 2022-08-13: 3 [IU] via SUBCUTANEOUS
  Administered 2022-08-14: 2 [IU] via SUBCUTANEOUS
  Administered 2022-08-14: 3 [IU] via SUBCUTANEOUS
  Administered 2022-08-14: 2 [IU] via SUBCUTANEOUS
  Administered 2022-08-14 – 2022-08-16 (×8): 3 [IU] via SUBCUTANEOUS
  Administered 2022-08-16: 2 [IU] via SUBCUTANEOUS
  Administered 2022-08-16: 3 [IU] via SUBCUTANEOUS

## 2022-08-02 MED ORDER — GLUCERNA 1.5 CAL PO LIQD
1000.0000 mL | ORAL | Status: DC
  Administered 2022-08-02 – 2022-08-04 (×4): 1000 mL
  Filled 2022-08-02 (×3): qty 1000

## 2022-08-02 MED ORDER — ATORVASTATIN CALCIUM 80 MG PO TABS
80.0000 mg | ORAL_TABLET | Freq: Every day | ORAL | Status: DC
  Administered 2022-08-03 – 2022-08-22 (×20): 80 mg
  Filled 2022-08-02 (×20): qty 1

## 2022-08-02 MED ORDER — GUAIFENESIN-DM 100-10 MG/5ML PO SYRP: 5.0000 mL | ORAL_SOLUTION | Freq: Four times a day (QID) | ORAL | Status: DC | PRN

## 2022-08-02 MED ORDER — FLEET ENEMA 7-19 GM/118ML RE ENEM: 1.0000 | ENEMA | Freq: Once | RECTAL | Status: DC | PRN

## 2022-08-02 MED ORDER — POLYETHYLENE GLYCOL 3350 17 G PO PACK
17.0000 g | PACK | Freq: Every day | ORAL | Status: DC | PRN
  Administered 2022-08-19: 17 g

## 2022-08-02 MED ORDER — PROCHLORPERAZINE MALEATE 5 MG PO TABS: 5.0000 mg | ORAL_TABLET | Freq: Four times a day (QID) | ORAL | Status: DC | PRN

## 2022-08-02 MED ORDER — WHITE PETROLATUM EX OINT
TOPICAL_OINTMENT | CUTANEOUS | Status: DC | PRN
  Administered 2022-08-12: 0.2 via TOPICAL

## 2022-08-02 MED ORDER — LOSARTAN POTASSIUM 50 MG PO TABS: 50.0000 mg | ORAL_TABLET | Freq: Every day | ORAL | Status: DC

## 2022-08-02 MED ORDER — ALUM & MAG HYDROXIDE-SIMETH 200-200-20 MG/5ML PO SUSP: 30.0000 mL | ORAL | Status: DC | PRN

## 2022-08-02 MED ORDER — FREE WATER
200.0000 mL | Freq: Four times a day (QID) | Status: DC
  Administered 2022-08-02 – 2022-08-14 (×47): 200 mL

## 2022-08-02 MED ORDER — PROCHLORPERAZINE EDISYLATE 10 MG/2ML IJ SOLN: 5.0000 mg | Freq: Four times a day (QID) | INTRAMUSCULAR | Status: DC | PRN

## 2022-08-02 MED ORDER — SENNOSIDES-DOCUSATE SODIUM 8.6-50 MG PO TABS
1.0000 | ORAL_TABLET | Freq: Two times a day (BID) | ORAL | Status: DC
  Administered 2022-08-02 – 2022-08-03 (×3): 1
  Filled 2022-08-02 (×4): qty 1

## 2022-08-02 MED ORDER — EZETIMIBE 10 MG PO TABS
10.0000 mg | ORAL_TABLET | Freq: Every day | ORAL | Status: DC
  Administered 2022-08-03 – 2022-08-22 (×20): 10 mg
  Filled 2022-08-02 (×20): qty 1

## 2022-08-02 MED ORDER — LIDOCAINE 5 % EX PTCH
1.0000 | MEDICATED_PATCH | CUTANEOUS | Status: DC
  Administered 2022-08-03 – 2022-08-22 (×19): 1 via TRANSDERMAL
  Filled 2022-08-02 (×20): qty 1

## 2022-08-02 MED ORDER — PROCHLORPERAZINE 25 MG RE SUPP: 12.5000 mg | Freq: Four times a day (QID) | RECTAL | Status: DC | PRN

## 2022-08-02 NOTE — TOC Transition Note (Signed)
Transition of Care [facility]) - CM/SW Discharge Note   Patient Details  Name: Gina Kelley MRN: [id number] Date of Birth: [birth year]  Transition of Care [facility]) CM/SW Contact:  Tom-Johnson, Renea Ee, RN Phone Number: 08/02/2022, 12:45 PM   Clinical Narrative:     Patient is scheduled for discharge to CIR today. No further TOC needs noted.       Final next level of care: IP Rehab Facility Barriers to Discharge: Barriers Resolved   Patient Goals and CMS Choice CMS Medicare.gov Compare Post Acute Care list provided to:: Patient Choice offered to / list presented to : Patient, Adult Children  Discharge Placement                  Patient to be transferred to facility by: In hospital transfer      Discharge Plan and Services Additional resources added to the After Visit Summary for                  DME Arranged: N/A DME Agency: NA       HH Arranged: NA [place] Agency: NA        Social Determinants of Health (SDOH) Interventions SDOH Screenings   Depression (PHQ2-9): Low Risk  (11/15/2021)  Tobacco Use: Medium Risk (08/01/2022)     Readmission Risk Interventions     No data to display

## 2022-08-02 NOTE — Progress Notes (Addendum)
   Palliative Medicine Inpatient Follow Up Note HPI: 87 year old woman PMHx significant for HTN, HLD, CVA (1998 with residual LLE numbness), T2DM, osteopenia. Presented to [facility] ED via EMS 1/9 as a Code Stroke. CT Head demonstrated right IPH with 28mm midline shift and right frontoparietal SDH. Taken to OR 1/9 for R craniotomy/SDH evacuation. Palliative care has been asked to get involved to further support goals of care conversations given multiple failed extubations.    Today's Discussion 08/02/2022  *Please note that this is a verbal dictation therefore any spelling or grammatical errors are due to the "[employer]" system interpretation.  Chart reviewed inclusive of vital signs, progress notes, laboratory results, and diagnostic images. S/P G-Tub placement.  Gina Kelley is improving slowly. Able to give okay sign, shake had, continues to follow basic commands.  I spoke with patients three daughters regarding code status. They share that per discussion with the patient she desires full resuscitative efforts. They are aware of what implications this may have in the future.  Patients family is very hopeful for improvement and ongoing recovery.   Questions and concerns addressed/Palliative Support Provided.   Objective Assessment: Vital Signs Vitals:   08/02/22 1114 08/02/22 1117  BP: (!) 145/74 (!) 145/74  Pulse:  89  Resp:  (!) 21  Temp:  98.7 F (37.1 C)  SpO2:  96%    Intake/Output Summary (Last 24 hours) at 08/02/2022 1442 Last data filed at 08/02/2022 1300 Gross per 24 hour  Intake --  Output 2050 ml  Net -2050 ml    Last Weight  Most recent update: 08/02/2022  5:58 AM    Weight  97.3 kg (214 lb 8.1 oz)            Gen: Elderly  African-American female in NAD HEENT: moist  mucous membranes CV: Regular rate and rhythm PULM: On RA, breathing even and nonlabored ABD: G-Tube EXT: Generalized edema Neuro: Responds with right hand  SUMMARY OF RECOMMENDATIONS   Patients  family have elected to reverse DNR back to full code  S/P G-tub   Plan for transition to CIR this afternoon.   Billing based on MDM: Moderate  ______________________________________________________________________________________ [place] Team Team Cell Phone: [phone] Please utilize secure chat with additional questions, if there is no response within 30 minutes please call the above phone number  Palliative Medicine Team providers are available by phone from 7am to 7pm daily and can be reached through the team cell phone.  Should this patient require assistance outside of these hours, please call the patient's attending physician.

## 2022-08-02 NOTE — Progress Notes (Signed)
Trauma/Critical Care Follow Up Note  Subjective:    Overnight Issues:  None.  Tolerating TFs at 45cc/hr.  Denies abdominal pain Objective:  Vital signs for last 24 hours: Temp:  [98.1 F (36.7 C)-102 F (38.9 C)] 98.6 F (37 C) (02/03 0747) Pulse Rate:  [91-106] 95 (02/03 0747) Resp:  [14-20] 20 (02/03 0747) BP: (116-145)/(59-81) 145/74 (02/03 1114) SpO2:  [90 %-95 %] 95 % (02/03 0747) Weight:  [97.3 kg] 97.3 kg (02/03 0500)  Hemodynamic parameters for last 24 hours:    Intake/Output from previous day: 02/02 0701 - 02/03 0700 In: -  Out: 1450 [Urine:1450]  Intake/Output this shift: No intake/output data recorded.  Vent settings for last 24 hours:    Physical Exam:  Gen: comfortable, no distress Abd: soft, NT.  G-tube in place with TFs running well   Results for orders placed or performed during the hospital encounter of 07/08/22 (from the past 24 hour(s))  Glucose, capillary     Status: None   Collection Time: 08/01/22 11:49 AM  Result Value Ref Range   Glucose-Capillary 78 70 - 99 mg/dL  Glucose, capillary     Status: Abnormal   Collection Time: 08/01/22  3:13 PM  Result Value Ref Range   Glucose-Capillary 66 (L) 70 - 99 mg/dL  Glucose, capillary     Status: None   Collection Time: 08/01/22  4:01 PM  Result Value Ref Range   Glucose-Capillary 71 70 - 99 mg/dL  CBC     Status: Abnormal   Collection Time: 08/01/22  4:35 PM  Result Value Ref Range   WBC 14.6 (H) 4.0 - 10.5 K/uL   RBC 2.80 (L) 3.87 - 5.11 MIL/uL   Hemoglobin 8.1 (L) 12.0 - 15.0 g/dL   HCT 25.1 (L) 36.0 - 46.0 %   MCV 89.6 80.0 - 100.0 fL   MCH 28.9 26.0 - 34.0 pg   MCHC 32.3 30.0 - 36.0 g/dL   RDW 16.7 (H) 11.5 - 15.5 %   Platelets 495 (H) 150 - 400 K/uL   nRBC 0.0 0.0 - 0.2 %  Basic metabolic panel     Status: Abnormal   Collection Time: 08/01/22  4:35 PM  Result Value Ref Range   Sodium 140 135 - 145 mmol/L   Potassium 3.3 (L) 3.5 - 5.1 mmol/L   Chloride 102 98 - 111 mmol/L   CO2  25 22 - 32 mmol/L   Glucose, Bld 71 70 - 99 mg/dL   BUN 13 8 - 23 mg/dL   Creatinine, Ser 0.57 0.44 - 1.00 mg/dL   Calcium 8.6 (L) 8.9 - 10.3 mg/dL   GFR, Estimated >60 >60 mL/min   Anion gap 13 5 - 15  Glucose, capillary     Status: Abnormal   Collection Time: 08/01/22  7:29 PM  Result Value Ref Range   Glucose-Capillary 109 (H) 70 - 99 mg/dL  Glucose, capillary     Status: Abnormal   Collection Time: 08/01/22 11:04 PM  Result Value Ref Range   Glucose-Capillary 153 (H) 70 - 99 mg/dL  Glucose, capillary     Status: Abnormal   Collection Time: 08/02/22  3:08 AM  Result Value Ref Range   Glucose-Capillary 135 (H) 70 - 99 mg/dL  Glucose, capillary     Status: Abnormal   Collection Time: 08/02/22  8:10 AM  Result Value Ref Range   Glucose-Capillary 136 (H) 70 - 99 mg/dL  Glucose, capillary     Status: Abnormal  Collection Time: 08/02/22 11:33 AM  Result Value Ref Range   Glucose-Capillary 114 (H) 70 - 99 mg/dL    Assessment & Plan: Present on Admission:  ICH (intracerebral hemorrhage) ([facility])    LOS: 25 days   Additional comments:I reviewed the patient's new clinical lab test results.   and I reviewed the patients new imaging test results.    [place]  S/p PEG placement for Dysphagia, 2/2 -tolerating TFs well G-tube in place and minimal pain -further care per primary team.  We will sign off at this time   FEN - TF   Henreitta Cea 11:41 AM 08/02/2022

## 2022-08-02 NOTE — Progress Notes (Signed)
INPATIENT REHABILITATION ADMISSION NOTE   Arrival Method: bed     Mental Orientation: alert   Assessment: see flowsheet   Skin: see flowsheet   IV'S: left arm   Pain: none reported   Tubes and Drains: peg tube in place   Safety Measures: in place   Vital Signs: see flowsheet   Height and Weight: see flowsheet   Rehab Orientation: completed   Family: at bedside

## 2022-08-02 NOTE — Progress Notes (Signed)
Inpatient Rehabilitation  Medication Review by a Pharmacist  A complete drug regimen review was completed for this patient to identify any potential clinically significant medication issues.  High Risk Drug Classes Is patient taking? Indication by Medication  Antipsychotic Yes Compazine- nausea  Anticoagulant No   Antibiotic No   Opioid No   Antiplatelet No   Hypoglycemics/insulin Yes SSI- T2DM  Vasoactive Medication Yes Amlodipine, Losartan- HTN  Chemotherapy No   Other Yes    Atorvastatin, Ezetimibe-HLD Lidocaine trandermal patch-pain Protonix-GERD Methocarbamol-muscle spasms Trazodone -sleep     Type of Medication Issue Identified Description of Issue Recommendation(s)  Drug Interaction(s) (clinically significant)     Duplicate Therapy     Allergy     No Medication Administration End Date     Incorrect Dose     Additional Drug Therapy Needed     Significant med changes from prior encounter (inform family/care partners about these prior to discharge). Cholecalciferol (Vitamin D), Aspirin, Fosamax.  Restart PTA meds when and if necessary during CIR admission or at time of discharge, if warranted. Fosamax held inpatient per P&T committee policy.  Other       Clinically significant medication issues were identified that warrant physician communication and completion of prescribed/recommended actions by midnight of the next day:  No  Name of provider notified for urgent issues identified:   Provider Method of Notification:     Pharmacist comments:   Time spent performing this drug regimen review (minutes):  [address], [place] Clinical Pharmacist 08/02/2022 2:41 PM

## 2022-08-02 NOTE — Progress Notes (Signed)
Inpatient Rehabilitation Admission Medication Review by a Pharmacist  A complete drug regimen review was completed for this patient to identify any potential clinically significant medication issues.  High Risk Drug Classes Is patient taking? Indication by Medication  Antipsychotic Yes Compazine - prn N/V  Anticoagulant No   Antibiotic No   Opioid No   Antiplatelet No   Hypoglycemics/insulin Yes SSI - DM  Vasoactive Medication Yes Amlodipine, losartan - BP  Chemotherapy No   Other Yes Methocarbamol - prn spasms Trazodone - prn sleep Pantoprazole - reflux Lidocaine - topical pain Atorvastatin, Zetia - HLD     Type of Medication Issue Identified Description of Issue Recommendation(s)  Drug Interaction(s) (clinically significant)     Duplicate Therapy     Allergy     No Medication Administration End Date     Incorrect Dose     Additional Drug Therapy Needed     Significant med changes from prior encounter (inform family/care partners about these prior to discharge).    Other       Clinically significant medication issues were identified that warrant physician communication and completion of prescribed/recommended actions by midnight of the next day:  No  Pharmacist comments: None  Time spent performing this drug regimen review (minutes): 30 minutes  Louanne Belton, PharmD, [facility] PGY1 Pharmacy Resident 08/02/2022 2:29 PM

## 2022-08-02 NOTE — H&P (Signed)
Physical Medicine and Rehabilitation Admission H&P     CC: Functional deficits secondary to subdural hematoma with intracranial hemorrhage status post right frontotemporoparietal craniotomy   HPI: Gina Kelley is an 87 year old female who presented to the emergency department on 07/08/2022 due to strokelike symptoms.  Her family noticed confusion, difficulty with speech, facial droop and left-sided weakness.  They called EMS and she was noted to have an initial systolic blood pressure of 270.  CT of the head showed large acute intraparenchymal hematoma with associated subdural hematoma.  There was mass effect with 12 mm of midline shift.  Trauma history was elicited.  Neurology and neurosurgery were consulted.  She was started on Cleviprex infusion and Decadron 10 mg x 1 dose.  She was intubated for airway control.  Nasogastric tube was placed.  She was taken to the operating room where she underwent right frontotemporoparietal craniotomy and evacuation of intracerebral hemorrhage and right convexity subdural hematoma by Dr. Dutch Quint.  Biopsy of suspicious tissue was obtained.  It was negative for malignant process.  Postoperative CT of the head on 1/10 demonstrated resolution of SDH postcraniotomy and some residual hemorrhage without evidence of mass effect.  She was started on hypertonic saline on 1/13.  She was extubated on 1/15.  Later that day her blood gases were significant for hypercapnia with respiratory rate of 32 she was reintubated.  Her left side remains plegic.  She again failed extubation on 1/18 and required reintubation.  Foley to care consultation was obtained on 1/19.  Family decided on DNR CODE STATUS.  On 1/23 she was tolerating pressure support and was extubated. On EGD with percutaneous endoscopic gastrostomy tube placement on 2/2 by Dr. Bedelia Person. The patient requires inpatient physical medicine and rehabilitation evaluations and treatment secondary to dysfunction due to intracranial  hemorrhage, subdural hematoma status postcraniotomy.   ROS: Unable to obtain d/t aphasia      Past Medical History:  Diagnosis Date   Anemia of chronic disease 07/08/2006   Antral ulcer 02/23/2007    Seen on EGD in 2008, small ulcer with erosion    Cerebral vascular accident (HCC) 04/14/2006    1998, left lower extremity numbness, no residual deficits    Essential hypertension 04/14/2006   Gastroesophageal reflux disease 02/18/2013    Occasional, symptomatically relieved with peptobismol    Hyperlipidemia LDL goal < 100 04/14/2006   Hypertensive retinopathy of both eyes, grade 1 05/15/2016   Osteopenia of right femoral neck 10/20/2016    DEXA (10/16/2016): R femur T -2.5 (FRAX tool calculates at -2.4), L1-L4 spine T -0.9, 10 year risk for: Major osteoporotic fracture 8.3%, Hip fracture 2.7%   Type 2 diabetes mellitus with stage 1 chronic kidney disease, without long-term current use of insulin (HCC)     Type II diabetes mellitus (HCC) 04/14/2006         Past Surgical History:  Procedure Laterality Date   COLONOSCOPY       CRANIOTOMY Right 07/08/2022    Procedure: RIGHT CRANIOTOMY HEMATOMA EVACUATION SUBDURAL;  Surgeon: Julio Sicks, MD;  Location: [facility] OR;  Service: Neurosurgery;  Laterality: Right;   ESOPHAGOGASTRODUODENOSCOPY             Family History  Problem Relation Age of Onset   Stroke Mother     Diabetes Mother     Hypertension Mother     Anuerysm Father 71        Cerebral   Asthma Sister     Breast cancer Sister  Pulmonary disease Brother          Black lung   Stroke Sister     Cirrhosis Brother     Hypertension Son     Asthma Brother      Social History:  reports that she quit smoking about 44 years ago. She has never used smokeless tobacco. She reports that she does not drink alcohol and does not use drugs. Allergies:       Allergies  Allergen Reactions   Enalapril Cough   Chlorhexidine Swelling      Possible irritation from CHG, eye tongue and lip swelling           Medications Prior to Admission  Medication Sig Dispense Refill   amLODipine (NORVASC) 5 MG tablet Take 1 tablet (5 mg total) by mouth daily. 90 tablet 3   aspirin 81 MG chewable tablet Chew 81 mg by mouth daily.       atorvastatin (LIPITOR) 80 MG tablet Take 1 tablet by mouth once daily (Patient taking differently: Take 80 mg by mouth daily.) 90 tablet 3   Cholecalciferol (VITAMIN D3) 75 MCG (3000 UT) TABS Take 1 tablet by mouth daily. (Patient taking differently: Take 3,000 Units by mouth daily.) 30 tablet     ezetimibe (ZETIA) 10 MG tablet Take 1 tablet (10 mg total) by mouth daily. 90 tablet 3   losartan (COZAAR) 25 MG tablet Take 1 tablet by mouth once daily 90 tablet 3   Multiple Vitamins-Minerals (PRESERVISION AREDS PO) Take 1 tablet by mouth in the morning and at bedtime.       alendronate (FOSAMAX) 70 MG tablet TAKE ONE TABLET BY MOUTH EVERY 7 DAYS. TAKE WITH A FULL GLASS OF WATER ON AN EMPTY STOMACH (Patient not taking: Reported on 07/09/2022) 12 tablet 0          Home: Home Living Family/patient expects to be discharged to:: Private residence Living Arrangements: Spouse/significant other Available Help at Discharge: Family, Available 24 hours/day Type of Home: House Home Access: Stairs to enter [employer] of Steps: 4 Home Layout: One level Bathroom Shower/Tub: Engineer, manufacturing systems: Standard Home Equipment: None   Functional History: Prior Function Prior Level of Function : Independent/Modified Independent, Driving   Functional Status:  Mobility: Bed Mobility Overal bed mobility: Needs Assistance Bed Mobility: Supine to Sit Rolling: Mod assist Sidelying to sit: Mod assist, Max assist, +2 for safety/equipment Supine to sit: Max assist, HOB elevated Sit to supine: Max assist Sit to sidelying: +2 for physical assistance, Mod assist General bed mobility comments: pt initiated assist in prepping for roll L, needing truncal /LE assis to  roll, and truncal/LE assist up from L elbow and R hand to sitting EOB.  Pt needing scooting assist. Transfers Overall transfer level: Needs assistance Equipment used: 1 person hand held assist Transfers: Sit to/from Stand Sit to Stand: Max assist, +2 safety/equipment (x4 over the course of the session) Bed to/from chair/wheelchair/BSC transfer type:: Squat pivot Stand pivot transfers: Max assist Squat pivot transfers: Max assist, +2 safety/equipment General transfer comment: pt followed commands/cues well to sequence the transfer to stand and pivot to the chair. Ambulation/Gait General Gait Details: unable   ADL: ADL Overall ADL's : Needs assistance/impaired Eating/Feeding: NPO Grooming: Maximal assistance Grooming Details (indicate cue type and reason): washing face Upper Body Bathing: Maximal assistance Upper Body Bathing Details (indicate cue type and reason): assisting with washing her upper body Lower Body Bathing: Total assistance Upper Body Dressing : Total assistance Lower Body  Dressing: Total assistance General ADL Comments: dependent   Cognition: Cognition Overall Cognitive Status: Difficult to assess Arousal/Alertness: Awake/alert Orientation Level: Other (comment) (not answering questions) Attention: Focused, Sustained Focused Attention: Appears intact Sustained Attention: Impaired Sustained Attention Impairment: Verbal basic, Functional basic Cognition Arousal/Alertness: Awake/alert Behavior During Therapy: Flat affect Overall Cognitive Status: Difficult to assess General Comments: followed all commands; vocalized "yes"  communicating by thumbs up/down;  "okay" sign, Difficult to assess due to: Impaired communication   Physical Exam: Blood pressure 137/76, pulse 91, temperature 98.1 F (36.7 C), temperature source Axillary, resp. rate 15, height 5\' 3"  (1.6 m), weight 97.3 kg, SpO2 94 %. Physical Exam  Constitutional: No apparent distress. Appropriate  appearance for age. HENT: No JVD. Neck Supple. Trachea midline. Atraumatic, normocephalic.  + L hemineglect, +Severe lip swelling, unable to extrude tongue.  Eyes: PERRLA. Does not look to L past midline.   Cardiovascular: RRR, no murmurs/rub/gallops. Trace peripheral Edema. Peripheral pulses 2+  Respiratory: CTAB. No rales, rhonchi, or wheezing. On RA.  Abdomen: + bowel sounds, normoactive. TTP around PEG site GU: Not examined. +Foley, draining clear urine.  Skin: C/D/I. No apparent lesions. +IV, c/d/I. +Peg, mild erythema around site, no warmth, fluctuance, or discharge.  MSK:      No apparent deformity.      Strength:                RUE: 5/5 SA, 5/5 EF, 5/5 EE, 5/5 WE, 5/5 FF, 5/5 FA                 LUE: 1/5 SA, 0/5 EF, 2-/5 EE, 0/5 WE, 0/5 FF, 0/5 FA                 RLE: 5/5 HF, 5/5 KE, 5/5 DF, 5/5 EHL, 5/5 PF                 LLE:  4-/5 HF, 4-/5 KE, 3/5 DF, 3/5 EHL, 3/5 PF   Neurologic exam:  Cognition: AAO to person, place; time with options; alert Language :difficult to assess d/t severe aphafsia/nonverbal, writes fluently, can point or use hand signals appropriately Insight: Good insight into current condition.  Mood: Pleasant affect, appropriate mood.  Sensation: To light touch intact in BL UEs and LEs  Reflexes: 2+ in BL UE and LEs. + LUE Hoffman's , no babinski signs bilaterally.  CN: +L facial droop. + eyes dont deviate past midline L. + L shoulder weakness Coordination: No apparent tremors. No ataxia on FTN, HTS RUE or RLE Spasticity: MAS 1+ in R neck sidebending, rotation       Lab Results Last 48 Hours        Results for orders placed or performed during the hospital encounter of 07/08/22 (from the past 48 hour(s))  Glucose, capillary     Status: Abnormal    Collection Time: 07/30/22  3:10 PM  Result Value Ref Range    Glucose-Capillary 159 (H) 70 - 99 mg/dL      Comment: Glucose reference range applies only to samples taken after fasting for at least 8 hours.   Glucose, capillary     Status: Abnormal    Collection Time: 07/30/22  7:44 PM  Result Value Ref Range    Glucose-Capillary 167 (H) 70 - 99 mg/dL      Comment: Glucose reference range applies only to samples taken after fasting for at least 8 hours.  Glucose, capillary     Status: Abnormal    Collection Time:  07/30/22 11:10 PM  Result Value Ref Range    Glucose-Capillary 129 (H) 70 - 99 mg/dL      Comment: Glucose reference range applies only to samples taken after fasting for at least 8 hours.  Glucose, capillary     Status: Abnormal    Collection Time: 07/31/22  3:54 AM  Result Value Ref Range    Glucose-Capillary 133 (H) 70 - 99 mg/dL      Comment: Glucose reference range applies only to samples taken after fasting for at least 8 hours.  Glucose, capillary     Status: Abnormal    Collection Time: 07/31/22  7:51 AM  Result Value Ref Range    Glucose-Capillary 111 (H) 70 - 99 mg/dL      Comment: Glucose reference range applies only to samples taken after fasting for at least 8 hours.  Glucose, capillary     Status: Abnormal    Collection Time: 07/31/22 11:40 AM  Result Value Ref Range    Glucose-Capillary 150 (H) 70 - 99 mg/dL      Comment: Glucose reference range applies only to samples taken after fasting for at least 8 hours.  Glucose, capillary     Status: Abnormal    Collection Time: 07/31/22  3:26 PM  Result Value Ref Range    Glucose-Capillary 151 (H) 70 - 99 mg/dL      Comment: Glucose reference range applies only to samples taken after fasting for at least 8 hours.  Glucose, capillary     Status: Abnormal    Collection Time: 07/31/22  7:55 PM  Result Value Ref Range    Glucose-Capillary 110 (H) 70 - 99 mg/dL      Comment: Glucose reference range applies only to samples taken after fasting for at least 8 hours.  Glucose, capillary     Status: None    Collection Time: 07/31/22 11:05 PM  Result Value Ref Range    Glucose-Capillary 95 70 - 99 mg/dL      Comment: Glucose  reference range applies only to samples taken after fasting for at least 8 hours.  Glucose, capillary     Status: None    Collection Time: 08/01/22  3:10 AM  Result Value Ref Range    Glucose-Capillary 90 70 - 99 mg/dL      Comment: Glucose reference range applies only to samples taken after fasting for at least 8 hours.  Glucose, capillary     Status: Abnormal    Collection Time: 08/01/22  7:51 AM  Result Value Ref Range    Glucose-Capillary 68 (L) 70 - 99 mg/dL      Comment: Glucose reference range applies only to samples taken after fasting for at least 8 hours.  Glucose, capillary     Status: None    Collection Time: 08/01/22  8:08 AM  Result Value Ref Range    Glucose-Capillary 80 70 - 99 mg/dL      Comment: Glucose reference range applies only to samples taken after fasting for at least 8 hours.  Glucose, capillary     Status: None    Collection Time: 08/01/22 11:04 AM  Result Value Ref Range    Glucose-Capillary 91 70 - 99 mg/dL      Comment: Glucose reference range applies only to samples taken after fasting for at least 8 hours.  Glucose, capillary     Status: None    Collection Time: 08/01/22 11:49 AM  Result Value Ref Range    Glucose-Capillary 78  70 - 99 mg/dL      Comment: Glucose reference range applies only to samples taken after fasting for at least 8 hours.      Imaging Results (Last 48 hours)  No results found.         Blood pressure 137/76, pulse 91, temperature 98.1 F (36.7 C), temperature source Axillary, resp. rate 15, height 5\' 3"  (1.6 m), weight 97.3 kg, SpO2 94 %.   Medical Problem List and Plan: 1. Functional deficits secondary to subdural hematoma and ICH             -patient may shower with PEG site covered             -ELOS/Goals: 14-16 days, mod assist PT, mod assist OT, mod assist SLP   - Does well communicating with writing board and fingers for y/n  - Family requests change from DNR to full code on admission; changed   2.   Antithrombotics: -DVT/anticoagulation:  Mechanical:  Antiembolism stockings, knee (TED hose) Bilateral lower extremities             -antiplatelet therapy: none   3. Pain Management: Tylenol as needed   4. Mood/Behavior/Sleep: LCSW to evaluate and provide emotional support             -antipsychotic agents: n/a   5. Neuropsych/cognition: This patient is not capable of making decisions on her own behalf.   6. Skin/Wound Care: Routine skin care checks   7. Fluids/Electrolytes/Nutrition: Routine Is and Os and follow-up chemistries             -NPO; has PEG             -continue Glucerna 1.5 continuous Tfs   - Consult dietary to follow for titration   8: Hypertension: monitor TID and prn             -continue amlodipine 10 mg daily             -losartan 50 mg daily -> DC d/t angioedema, will discuss with Pharmacy alternative in AM    - Add hydralazine 10 mg PRN for SBP >180, DBP>100    08/02/2022    8:24 PM 08/02/2022    2:59 PM 08/02/2022    2:25 PM  Vitals with BMI  Height  5\' 3"    Weight  214 lbs 8 oz   BMI  38.01   Systolic 168  160  Diastolic 72  75  Pulse 94  95    9: Hyperlipidemia: continue statin, Zetia   10: SDH/ICH s/p crani: Finished Keppra for sz ppx.    11: Leukocytosis: current temperature 99.3, ceftazidime completed 1/29             -Follow-up CBC with differential, mild WBC increase 2.2 likely d/t POD#1; downtrending 2/3    - Fever overnight prior to admission; POD from PEG, monitor for recurrence  12: Anemia, multifactorial: Hemoglobin with slow trend upward             -Follow-up CBC - stable  13. Angioedema - ? as medication reaction  - Improving per family after DC chlorhexidine skin wipes and oral mouthwash   - Per SLP notes, interferes with tongue movement, speaking  - Hx cough with Enalipril, Losartan increased from OP 25 mg to 50 mg 1/14, never weaned; edema started 1/30 per chart review  - D/t cross reactivity, should not switch to ARB unless needed;  will discuss BP medications with pharmacy in AM 14.  L hemineglect - causing R gaze preference and positional tone             - Repositioning in bed to straighten head  - May need to consider botox given need to recover cognitive function and relatively little tone elsewhere   Barbie Banner, PA-C 08/01/2022  I have examined the patient independently and edited the note for HPI, ROS, exam, assessment, and plan as appropriate. I am in agreement with the above recommendations.   Gertie Gowda, DO 08/02/2022

## 2022-08-02 NOTE — Progress Notes (Signed)
This RN was told in report that family would like code status to be changed back to FULL code. MD was paged by dayshift RN with no reply. Will pass along to oncoming dayshift nurse

## 2022-08-03 LAB — GLUCOSE, CAPILLARY
Glucose-Capillary: 121 mg/dL — ABNORMAL HIGH (ref 70–99)
Glucose-Capillary: 135 mg/dL — ABNORMAL HIGH (ref 70–99)
Glucose-Capillary: 138 mg/dL — ABNORMAL HIGH (ref 70–99)
Glucose-Capillary: 147 mg/dL — ABNORMAL HIGH (ref 70–99)
Glucose-Capillary: 155 mg/dL — ABNORMAL HIGH (ref 70–99)
Glucose-Capillary: 158 mg/dL — ABNORMAL HIGH (ref 70–99)
Glucose-Capillary: 161 mg/dL — ABNORMAL HIGH (ref 70–99)

## 2022-08-03 NOTE — Evaluation (Signed)
Speech Language Pathology Assessment and Plan  Patient Details  Name: Gina Kelley MRN: [id number] Date of Birth: [birth year]  SLP Diagnosis: Dysarthria;Dysphagia;Cognitive Impairments  Rehab Potential: Fair ELOS: 21-24 days    Today's Date: 08/03/2022 SLP Individual Time: 8295-6213 SLP Individual Time Calculation (min): 60 min   Hospital Problem: Principal Problem:   Subdural hematoma ([place])  Past Medical History:  Past Medical History:  Diagnosis Date   Anemia of chronic disease 07/08/2006   Antral ulcer 02/23/2007   Seen on EGD in 2008, small ulcer with erosion    Cerebral vascular accident ([place]) 04/14/2006   1998, left lower extremity numbness, no residual deficits    Essential hypertension 04/14/2006   Gastroesophageal reflux disease 02/18/2013   Occasional, symptomatically relieved with [place] maintenance 12/04/2011   Hyperlipidemia LDL goal < 100 04/14/2006   Hypertensive retinopathy of both eyes, grade 1 05/15/2016   Osteopenia of right femoral neck 10/20/2016   DEXA (10/16/2016): R femur T -2.5 (FRAX tool calculates at -2.4), L1-L4 spine T -0.9, 10 year risk for: Major osteoporotic fracture 8.3%, Hip fracture 2.7%   Type 2 diabetes mellitus with stage 1 chronic kidney disease, without long-term current use of insulin ([place])    Type II diabetes mellitus ([place]) 04/14/2006   Past Surgical History:  Past Surgical History:  Procedure Laterality Date   COLONOSCOPY     CRANIOTOMY Right 07/08/2022   Procedure: RIGHT CRANIOTOMY HEMATOMA EVACUATION SUBDURAL;  Surgeon: Earnie Larsson, MD;  Location: [place];  Service: Neurosurgery;  Laterality: Right;   ESOPHAGOGASTRODUODENOSCOPY      Assessment / Plan / Recommendation Clinical Impression  Gina Kelley is an 87 year old female who presented to the emergency department on 07/08/2022 due to strokelike symptoms.  Her family noticed confusion, difficulty with speech, facial droop and left-sided weakness.  They  called EMS and she was noted to have an initial systolic blood pressure of 270.  CT of the head showed large acute intraparenchymal hematoma with associated subdural hematoma.  There was mass effect with 12 mm of midline shift.  Trauma history was elicited.  Neurology and neurosurgery were consulted.  She was started on Cleviprex infusion and Decadron 10 mg x 1 dose.  She was intubated for airway control.  Nasogastric tube was placed.  She was taken to the operating room where she underwent right frontotemporoparietal craniotomy and evacuation of intracerebral hemorrhage and right convexity subdural hematoma by Dr. Trenton Gammon.  Biopsy of suspicious tissue was obtained.  It was negative for malignant process.  Postoperative CT of the head on 1/10 demonstrated resolution of SDH postcraniotomy and some residual hemorrhage without evidence of mass effect.  She was started on hypertonic saline on 1/13.  She was extubated on 1/15.  Later that day her blood gases were significant for hypercapnia with respiratory rate of 32 she was reintubated.  Her left side remains plegic.  She again failed extubation on 1/18 and required reintubation.  Foley to care consultation was obtained on 1/19.  Family decided on DNR CODE STATUS.  On 1/23 she was tolerating pressure support and was extubated. On EGD with percutaneous endoscopic gastrostomy tube placement on 2/2 by Dr. Bobbye Morton. The patient requires inpatient physical medicine and rehabilitation evaluations and treatment secondary to dysfunction due to intracranial hemorrhage, subdural hematoma status postcraniotomy.  SLP evaluation was completed on 08/03/22 with results as follows:  Pt presents with severe dysphagia resulting from significant left sided oral motor weakness and lingual edema.  Pt's swallowing  safety is also hindered by postural limitations as pt's head and neck are currently flexed to the right with poor awareness and inability for therapist to manipulate pt to a more  neutral head position. Pt needed therapist to manually support her lips to approximate a labial seal in order to prevent complete spillage of all boluses from the oral cavity.  Right sided loss of boluses was noted more frequently than left during today's evaluation despite left sided weakness  which therapist suspects is a combination of effects of edema and suboptimal positioning.  Pt had what appeared to be a delay in swallow initiation and benefited from tactile and verbal cues to initiate a swallow.  The abovementioned deficits currently preclude safe PO diet initiation.  Recommend ongoing PO trials with SLP only.   Pt also presents with severe dysarthria resulting from oral motor weakness and edema.  Pt was completely unintelligible in the few instances that she attempted oral communication.  Pt currently indicates via several simple gestures (thumbs up, okay sign) and head nods/shakes her immediate needs and wants.  She appears to have relatively intact receptive language as she was noted to be able to follow commands ina  functional context.  Trials of writing were attempted but did not appear functional for meaningful communication at this time given pt's visual scanning deficits and postural limitations although pt was able to legibly write her name to dictation.    Additionally, pt appears to have some extent of cognitive deficits as noted on functional tasks including left inattention, decreased intellectual awareness of deficits, poor error awareness, decreased sustained attention to tasks, and decreased functional problem solving.    Given the abovementioned deficits, pt would benefit from skilled ST while inpatient in order to maximize functional independence and reduce burden of care prior to discharge.  Anticipate that pt will need 24/7 supervision at discharge in addition to [place] follow up at next level of care.     Skilled Therapeutic Interventions          Cognitive-linguistic evaluation  completed with results and recommendations reviewed with patient    SLP Assessment  Patient will need skilled [facility] during CIR admission    Recommendations  SLP Diet Recommendations: NPO;Alternative means - long-term Medication Administration: Via alternative means Oral Care Recommendations: Oral care QID Patient destination: Home Follow up Recommendations: Home Health SLP;24 hour supervision/assistance Equipment Recommended: To be determined    SLP Frequency 3 to 5 out of 7 days   SLP Duration  SLP Intensity  SLP Treatment/Interventions 21-24 days  Minumum of 1-2 x/day, 30 to 90 minutes  Cognitive remediation/compensation;Cueing hierarchy;Internal/external aids;Patient/family education;Functional tasks;Environmental controls;Speech/Language facilitation    Pain Pain Assessment Pain Scale: Faces Faces Pain Scale: No hurt  Prior Functioning Cognitive/Linguistic Baseline: Within functional limits Type of Home: House  Lives With: Spouse Available Help at Discharge: Family;Available 24 hours/day  SLP Evaluation Cognition Overall Cognitive Status: Impaired/Different from baseline Arousal/Alertness: Lethargic Attention: Sustained Sustained Attention: Impaired Sustained Attention Impairment: Verbal basic;Functional basic Awareness: Impaired Awareness Impairment: Intellectual impairment Problem Solving: Impaired Problem Solving Impairment: Functional basic;Verbal basic Safety/Judgment: Impaired Comments: left inattention  Comprehension Auditory Comprehension Overall Auditory Comprehension: Appears within functional limits for tasks assessed Expression Expression Primary Mode of Expression: Verbal Verbal Expression Overall Verbal Expression: Impaired Non-Verbal Means of Communication: Gestures Oral Motor Oral Motor/Sensory Function Overall Oral Motor/Sensory Function: Severe impairment Facial ROM: Reduced left;Suspected CN VII (facial)  dysfunction Facial Symmetry: Abnormal symmetry left;Suspected CN VII (facial) dysfunction;Abnormal symmetry right  Facial Strength: Reduced right;Reduced left Facial Sensation: Reduced right;Reduced left Lingual ROM: Reduced right;Reduced left Lingual Strength: Reduced;Suspected CN XII (hypoglossal) dysfunction Mandible: Within Functional Limits Motor Speech Overall Motor Speech: Impaired Respiration: Within functional limits Phonation: Hoarse Articulation: Impaired Level of Impairment: Word Intelligibility: Intelligible Word: 0-24% accurate  Care Tool Care Tool Cognition Ability to hear (with hearing aid or hearing appliances if normally used Ability to hear (with hearing aid or hearing appliances if normally used): 1. Minimal difficulty - difficulty in some environments (e.g. when person speaks softly or setting is noisy)   Expression of Ideas and Wants Expression of Ideas and Wants: 1. Rarely/Never expressess or very difficult - rarely/never expresses self or speech is very difficult to understand   Understanding Verbal and Non-Verbal Content Understanding Verbal and Non-Verbal Content: 2. Sometimes understands - understands only basic conversations or simple, direct phrases. Frequently requires cues to understand  Memory/Recall Ability Memory/Recall Ability :  (difficult to determine due to significant speech/language impairments)   PMSV Assessment  PMSV Trial Intelligibility: Intelligible Word: 0-24% accurate  Bedside Swallowing Assessment General Date of Onset: 07/22/22 Previous Swallow Assessment: BSE 1/26 Diet Prior to this Study: PEG tube;NPO Temperature Spikes Noted: Yes (low grade 100.4 per chart review) Respiratory Status: Room air History of Recent Intubation: Yes Length of Intubations (days): 14 days Date extubated: 07/22/22 Behavior/Cognition: Cooperative;Lethargic/Drowsy;Requires cueing Oral Cavity - Dentition: Edentulous Self-Feeding Abilities: Total  assist Vision: Impaired for self-feeding Patient Positioning: Upright in bed Baseline Vocal Quality: Hoarse Volitional Cough: Weak Volitional Swallow: Able to elicit  Oral Care Assessment   Ice Chips   Thin Liquid Thin Liquid: Impaired Presentation:  (straw siphon) Oral Phase Impairments: Reduced labial seal;Reduced lingual movement/coordination Oral Phase Functional Implications: Right anterior spillage;Prolonged oral transit Pharyngeal  Phase Impairments: Suspected delayed Swallow Nectar Thick Nectar Thick Liquid: Impaired Oral Phase Impairments: Reduced labial seal Oral phase functional implications: Right anterior spillage Pharyngeal Phase Impairments: Suspected delayed Swallow Honey Thick   Puree Puree: Impaired Presentation: Spoon Oral Phase Impairments: Reduced labial seal;Reduced lingual movement/coordination Oral Phase Functional Implications: Right anterior spillage Pharyngeal Phase Impairments: Suspected delayed Swallow Solid   BSE Assessment Risk for Aspiration Impact on safety and function: Severe aspiration risk;Risk for inadequate nutrition/hydration Other Related Risk Factors: Prolonged intubation;Cognitive impairment;Deconditioning  Short Term Goals: Week 1: SLP Short Term Goal 1 (Week 1): Pt will consume therapeutic trials of POs (water or nectar thick liquids) with max assist to contain boluses orally and initiate swallow. SLP Short Term Goal 2 (Week 1): Pt will locate objects at midline during functional tasks in 50% of opportunities with max assist multimodal cues. SLP Short Term Goal 3 (Week 1): Pt will use overarticulation and increased vocal intensity to achieve intelligibility at the single phoneme level with max assist multimodal cues. SLP Short Term Goal 4 (Week 1): Pt will sustain her attention to functional tasks for ~3 minute intervals with max cues for redirection. SLP Short Term Goal 5 (Week 1): Pt will communicate basic, immediate wants and  needs via multiple modalities with max assist multimodal cues.  Refer to Care Plan for Long Term Goals  Recommendations for other services: None   Discharge Criteria: Patient will be discharged from SLP if patient refuses treatment 3 consecutive times without medical reason, if treatment goals not met, if there is a change in medical status, if patient makes no progress towards goals or if patient is discharged from hospital.  The above assessment, treatment plan, treatment alternatives and goals were discussed and mutually  agreed upon: by patient  Emilio Math 08/03/2022, 3:46 PM

## 2022-08-03 NOTE — Progress Notes (Addendum)
PROGRESS NOTE   Subjective/Complaints:  No events overnight. Patient without complaints this AM. Denies pain. Following all simple commands on R, appropriate communication with hands (1 finger for yes, 2 for no) and spelling words in the air.   ROS: Limited d/t aphasia.   Objective:   No results found. Recent Labs    08/01/22 1635 08/02/22 1456  WBC 14.6* 12.3*  HGB 8.1* 7.2*  HCT 25.1* 21.7*  PLT 495* 402*   Recent Labs    08/01/22 1635 08/02/22 1456  NA 140 135  K 3.3* 3.7  CL 102 99  CO2 25 27  GLUCOSE 71 151*  BUN 13 20  CREATININE 0.57 0.61  CALCIUM 8.6* 8.2*    Intake/Output Summary (Last 24 hours) at 08/03/2022 2219 Last data filed at 08/03/2022 1843 Gross per 24 hour  Intake 774.75 ml  Output 1700 ml  Net -925.25 ml     Pressure Injury 07/17/22 Sacrum Left Stage 2 -  Partial thickness loss of dermis presenting as a shallow open injury with a red, pink wound bed without slough. (Active)  07/17/22 1700  Location: Sacrum  Location Orientation: Left  Staging: Stage 2 -  Partial thickness loss of dermis presenting as a shallow open injury with a red, pink wound bed without slough.  Wound Description (Comments):   Present on Admission: No    Physical Exam: Vital Signs Blood pressure (!) 150/70, pulse (!) 101, temperature 99.5 F (37.5 C), resp. rate 17, height 5\' 3"  (1.6 m), weight 93.4 kg, SpO2 91 %.  Constitutional: No apparent distress. Appropriate appearance for age. HENT: No JVD. Neck Supple. Trachea midline. Atraumatic, normocephalic.   + L hemineglect, +Severe lip swelling, barely able to extrude tongue.  Eyes: PERRLA. Does not look to L past midline.   Cardiovascular: RRR, no murmurs/rub/gallops. no peripheral Edema. Peripheral pulses 2+  Respiratory: CTAB. No rales, rhonchi, or wheezing. On RA.  Abdomen: + bowel sounds, normoactive. + PEG LUQ GU: Not examined. +Foley, draining clear urine.   Skin: C/D/I. No apparent lesions. +IV, c/d/I. +Peg c/d/i + coccyx scab - no longer wound per nursing (see above)  MSK:      No apparent deformity.      Strength:                LUE: Flaccid with  2/5 elbow extension                LLE:  4-/5 proximal, 3/5 distal   Neurologic exam:  Cognition: AAO to person, place; time with options; alert Language  :difficult to assess d/t severe aphasia + writes fluently, can point or use hand signals appropriately Insight: Fair insight into current condition. Knows she had a stroke.   + LUE Hoffman's; +L facial droop. + eyes dont deviate past midline L. + L shoulder weakness Spasticity: MAS 1+ in R neck sidebending, rotation - improved positioning 2/4  Assessment/Plan: 1. Functional deficits which require 3+ hours per day of interdisciplinary therapy in a comprehensive inpatient rehab setting. Physiatrist is providing close team supervision and 24 hour management of active medical problems listed below. Physiatrist and rehab team continue to assess barriers to discharge/monitor  patient progress toward functional and medical goals  Care Tool:  Bathing    Body parts bathed by patient: Face   Body parts bathed by helper: Left lower leg, Right lower leg, Left upper leg, Right upper leg, Buttocks, Front perineal area, Abdomen, Chest, Left arm, Right arm     Bathing assist Assist Level: Total Assistance - Patient < 25%     Upper Body Dressing/Undressing Upper body dressing   What is the patient wearing?: Pull over shirt    Upper body assist Assist Level: Total Assistance - Patient < 25%    Lower Body Dressing/Undressing Lower body dressing      What is the patient wearing?: Pants, Incontinence brief     Lower body assist Assist for lower body dressing: Dependent - Patient 0%     Toileting Toileting    Toileting assist Assist for toileting: Dependent - Patient 0%     Transfers Chair/bed transfer  Transfers assist  Chair/bed  transfer activity did not occur: Safety/medical concerns (unable 2/2 pain, fatigue, weakness)  Chair/bed transfer assist level: 2 Helpers     Locomotion Ambulation   Ambulation assist   Ambulation activity did not occur: Safety/medical concerns (unable 2/2 pain, fatigue, weakness)          Walk 10 feet activity   Assist  Walk 10 feet activity did not occur: Safety/medical concerns        Walk 50 feet activity   Assist Walk 50 feet with 2 turns activity did not occur: Safety/medical concerns         Walk 150 feet activity   Assist Walk 150 feet activity did not occur: Safety/medical concerns         Walk 10 feet on uneven surface  activity   Assist Walk 10 feet on uneven surfaces activity did not occur: Safety/medical concerns         Wheelchair     Assist Is the patient using a wheelchair?: Yes Type of Wheelchair: Manual Wheelchair activity did not occur: Safety/medical concerns (unable 2/2 pain, fatigue, weakness)    Max wheelchair distance: 0    Wheelchair 50 feet with 2 turns activity    Assist    Wheelchair 50 feet with 2 turns activity did not occur: Safety/medical concerns       Wheelchair 150 feet activity     Assist  Wheelchair 150 feet activity did not occur: Safety/medical concerns       Blood pressure (!) 150/70, pulse (!) 101, temperature 99.5 F (37.5 C), resp. rate 17, height 5\' 3"  (1.6 m), weight 93.4 kg, SpO2 91 %.    Medical Problem List and Plan: 1. Functional deficits secondary to subdural hematoma and ICH             -patient may shower with PEG site covered             -ELOS/Goals: 14-16 days, mod assist PT, mod assist OT, mod assist SLP              - Does well communicating with writing board and fingers for y/n             - Family requests change from DNR to full code on admission; changed   2.  Antithrombotics: -DVT/anticoagulation:  Mechanical:  Antiembolism stockings, knee (TED hose)  Bilateral lower extremities             -antiplatelet therapy: none   3. Pain Management: Tylenol as needed    4.  Mood/Behavior/Sleep: LCSW to evaluate and provide emotional support             -antipsychotic agents: n/a   5. Neuropsych/cognition: This patient is not capable of making decisions on her own behalf.   6. Skin/Wound Care: Routine skin care checks - coccyx PI with scab per nursing report - will hold off on DC foley trial for now given no bed mobility until completely healed   7. Fluids/Electrolytes/Nutrition: Routine Is and Os and follow-up chemistries             -NPO; has PEG             -continue Glucerna 1.5 continuous Tfs              - Consult dietary to follow for titration   8: Hypertension: monitor TID and prn             -continue amlodipine 10 mg daily             -losartan 50 mg daily -> Dced 2/3 d/t concern for angioedema               - Add hydralazine 10 mg PRN for SBP >180, DBP>100    08/03/2022    8:03 PM 08/03/2022    1:28 PM 08/03/2022    6:01 AM  Vitals with BMI  Weight   206 lbs  BMI   76.1  Systolic 607 371 062  Diastolic 70 83 93  Pulse 694 98 105   2/4: BP stable/high; mild tachycardia this AM; if AM labs do not show dehydration, would start HCTZ 12.5 mg daily for adjunctive control  9: Hyperlipidemia: continue statin, Zetia   10: SDH/ICH s/p crani: Finished Keppra for sz ppx.    11: Leukocytosis: current temperature 99.3, ceftazidime completed 1/29             -Follow-up CBC with differential, mild WBC increase 2.2 likely d/t POD#1; downtrending 2/3               - Fever overnight prior to admission; POD from PEG, monitor for recurrence  - No fever recurrence 2/3; monitor   12: Anemia, multifactorial: Hemoglobin with slow trend upward             -Follow-up CBC - stable   13. ?Angioedema vs. Chlorhexadine reaction             - Improving per family after DC chlorhexidine skin wipes and oral mouthwash              - Per SLP notes,  interferes with tongue movement, speaking             - Hx cough with Enalipril, Losartan increased from OP 25 mg to 50 mg 1/14, never weaned; edema started 1/30 per chart review             - d/w pharmacy 2/4 and agree that reaction can occur at any time, and may cross-react with ARB. Will attempt to titrate BP meds without ACE/ARBs and monitor swelling.   14. L hemineglect - causing R gaze preference and positional tone             - Repositioning in bed to straighten head             - May need to consider botox given need to recover cognitive function and relatively little tone elsewhere  LOS: 1 days A FACE TO [place]  Karl Pock 08/03/2022, 10:19 PM

## 2022-08-03 NOTE — Discharge Instructions (Addendum)
Inpatient Rehab Discharge Instructions  Gina Kelley Discharge date and time: 08/22/2022  Activities/Precautions/ Functional Status: Activity: no lifting, driving, or strenuous exercise until cleared by MD Diet: cardiac diet Wound Care: none needed Functional status:  ___ No restrictions     ___ Walk up steps independently __x_ 24/7 supervision/assistance   ___ Walk up steps with assistance ___ Intermittent supervision/assistance  ___ Bathe/dress independently ___ Walk with walker     ___ Bathe/dress with assistance ___ Walk Independently    ___ Shower independently ___ Walk with assistance    __x_ Shower with assistance __x_ No alcohol     ___ Return to work/school ________  Special Instructions: No driving, alcohol consumption or tobacco use.   STROKE/TIA DISCHARGE INSTRUCTIONS SMOKING Cigarette smoking nearly doubles your risk of having a stroke & is the single most alterable risk factor  If you smoke or have smoked in the last 12 months, you are advised to quit smoking for your health. Most of the excess cardiovascular risk related to smoking disappears within a year of stopping. Ask you doctor about anti-smoking medications [place] Quit Line: 1-800-QUIT NOW Free Smoking Cessation Classes [phone]  CHOLESTEROL Know your levels; limit fat & cholesterol in your diet  Lipid Panel     Component Value Date/Time   CHOL 221 (H) 11/23/2020 0912   TRIG 31 07/09/2022 0523   HDL 63 11/23/2020 0912   CHOLHDL 3.5 11/23/2020 0912   CHOLHDL 3.5 04/14/2014 1023   VLDL 8 04/14/2014 1023   LDLCALC 153 (H) 11/23/2020 0912     Many patients benefit from treatment even if their cholesterol is at goal. Goal: Total Cholesterol (CHOL) less than 160 Goal:  Triglycerides (TRIG) less than 150 Goal:  HDL greater than 40 Goal:  LDL (LDLCALC) less than 100   BLOOD PRESSURE American Stroke Association blood pressure target is less that 120/80 mm/Hg  Your discharge blood pressure is:  BP:  136/70 Monitor your blood pressure Limit your salt and alcohol intake Many individuals will require more than one medication for high blood pressure  DIABETES (A1c is a blood sugar average for last 3 months) Goal HGBA1c is under 7% (HBGA1c is blood sugar average for last 3 months)  Diabetes: No known diagnosis of diabetes    Lab Results  Component Value Date   HGBA1C 6.1 (A) 07/12/2021    Your HGBA1c can be lowered with medications, healthy diet, and exercise. Check your blood sugar as directed by your physician Call your physician if you experience unexplained or low blood sugars.  PHYSICAL ACTIVITY/REHABILITATION Goal is 30 minutes at least 4 days per week  Activity: Increase activity slowly, Therapies: Physical Therapy: Home Health Return to work: n/a Activity decreases your risk of heart attack and stroke and makes your heart stronger.  It helps control your weight and blood pressure; helps you relax and can improve your mood. Participate in a regular exercise program. Talk with your doctor about the best form of exercise for you (dancing, walking, swimming, cycling).  DIET/WEIGHT Goal is to maintain a healthy weight  Your discharge diet is:  Diet Order             Diet NPO time specified  Diet effective now                  thin liquids Your height is:  Height: '5\' 3"'$  (160 cm) Your current weight is: Weight: 95.5 kg Your Body Mass Index (BMI) is:  BMI (Calculated): 37.3 Following  the type of diet specifically designed for you will help prevent another stroke. Your goal weight range is:   Your goal Body Mass Index (BMI) is 19-24. Healthy food habits can help reduce 3 risk factors for stroke:  High cholesterol, hypertension, and excess weight.  RESOURCES Stroke/Support Group:  Call [phone]   STROKE EDUCATION PROVIDED/REVIEWED AND GIVEN TO PATIENT Stroke warning signs and symptoms How to activate emergency medical system (call 911). Medications prescribed at  discharge. Need for follow-up after discharge. Personal risk factors for stroke. Pneumonia vaccine given: No Flu vaccine given: No My questions have been answered, the writing is legible, and I understand these instructions.  I will adhere to these goals & educational materials that have been provided to me after my discharge from the hospital.     My questions have been answered and I understand these instructions. I will adhere to these goals and the provided educational materials after my discharge from the hospital.  Patient/Caregiver Signature _______________________________ Date __________  Clinician Signature _______________________________________ Date __________  Please bring this form and your medication list with you to all your follow-up doctor's appointments. Inpatient Rehab Discharge Instructions  all your follow-up doctor's appointments.

## 2022-08-03 NOTE — Plan of Care (Signed)
  Problem: RH Balance Goal: LTG: Patient will maintain dynamic sitting balance (OT) Description: LTG:  Patient will maintain dynamic sitting balance with assistance during activities of daily living (OT) Flowsheets (Taken 08/03/2022 1930) LTG: Pt will maintain dynamic sitting balance during ADLs with: Contact Guard/Touching assist   Problem: RH Grooming Goal: LTG Patient will perform grooming w/assist,cues/equip (OT) Description: LTG: Patient will perform grooming with assist, with/without cues using equipment (OT) Flowsheets (Taken 08/03/2022 1930) LTG: Pt will perform grooming with assistance level of: Set up assist    Problem: RH Bathing Goal: LTG Patient will bathe all body parts with assist levels (OT) Description: LTG: Patient will bathe all body parts with assist levels (OT) Flowsheets (Taken 08/03/2022 1930) LTG: Pt will perform bathing with assistance level/cueing: Minimal Assistance - Patient > 75%   Problem: RH Dressing Goal: LTG Patient will perform upper body dressing (OT) Description: LTG Patient will perform upper body dressing with assist, with/without cues (OT). Flowsheets (Taken 08/03/2022 1930) LTG: Pt will perform upper body dressing with assistance level of: Minimal Assistance - Patient > 75% Goal: LTG Patient will perform lower body dressing w/assist (OT) Description: LTG: Patient will perform lower body dressing with assist, with/without cues in positioning using equipment (OT) Flowsheets (Taken 08/03/2022 1930) LTG: Pt will perform lower body dressing with assistance level of: Moderate Assistance - Patient 50 - 74%   Problem: RH Toileting Goal: LTG Patient will perform toileting task (3/3 steps) with assistance level (OT) Description: LTG: Patient will perform toileting task (3/3 steps) with assistance level (OT)  Flowsheets (Taken 08/03/2022 1930) LTG: Pt will perform toileting task (3/3 steps) with assistance level: Moderate Assistance - Patient 50 - 74%   Problem: RH  Functional Use of Upper Extremity Goal: LTG Patient will use RT/LT upper extremity as a (OT) Description: LTG: Patient will use right/left upper extremity as a stabilizer/gross assist/diminished/nondominant/dominant level with assist, with/without cues during functional activity (OT) Flowsheets (Taken 08/03/2022 1930) LTG: Use of upper extremity in functional activities: LUE as diminished level LTG: Pt will use upper extremity in functional activity with assistance level of: Minimal Assistance - Patient > 75%   Problem: RH Toilet Transfers Goal: LTG Patient will perform toilet transfers w/assist (OT) Description: LTG: Patient will perform toilet transfers with assist, with/without cues using equipment (OT) Flowsheets (Taken 08/03/2022 1930) LTG: Pt will perform toilet transfers with assistance level of: Minimal Assistance - Patient > 75%

## 2022-08-03 NOTE — Progress Notes (Signed)
Physical Therapy Assessment and Plan  Patient Details  Name: Gina Kelley MRN: [id number] Date of Birth: [birth year]  PT Diagnosis: Abnormal posture, Abnormality of gait, Cognitive deficits, Coordination disorder, Difficulty walking, Hemiparesis dominant, Hypotonia, Impaired cognition, Impaired sensation, Muscle weakness, and Pain in joint Rehab Potential: Fair ELOS: 2.5 - 3 weeks   Today's Date: 08/03/2022 PT Individual Time: 3329-5188 PT Individual Time Calculation (min): 66 min    Hospital Problem: Principal Problem:   Subdural hematoma ([place])   Past Medical History:  Past Medical History:  Diagnosis Date   Anemia of chronic disease 07/08/2006   Antral ulcer 02/23/2007   Seen on EGD in 2008, small ulcer with erosion    Cerebral vascular accident ([place]) 04/14/2006   1998, left lower extremity numbness, no residual deficits    Essential hypertension 04/14/2006   Gastroesophageal reflux disease 02/18/2013   Occasional, symptomatically relieved with peptobismol    Healthcare maintenance 12/04/2011   Hyperlipidemia LDL goal < 100 04/14/2006   Hypertensive retinopathy of both eyes, grade 1 05/15/2016   Osteopenia of right femoral neck 10/20/2016   DEXA (10/16/2016): R femur T -2.5 (FRAX tool calculates at -2.4), L1-L4 spine T -0.9, 10 year risk for: Major osteoporotic fracture 8.3%, Hip fracture 2.7%   Type 2 diabetes mellitus with stage 1 chronic kidney disease, without long-term current use of insulin (HCC)    Type II diabetes mellitus ([place]) 04/14/2006   Past Surgical History:  Past Surgical History:  Procedure Laterality Date   COLONOSCOPY     CRANIOTOMY Right 07/08/2022   Procedure: RIGHT CRANIOTOMY HEMATOMA EVACUATION SUBDURAL;  Surgeon: Earnie Larsson, MD;  Location: [facility];  Service: Neurosurgery;  Laterality: Right;   ESOPHAGOGASTRODUODENOSCOPY      Assessment & Plan Clinical Impression: Patient is a 87 y.o. year old female  who presented to the emergency department on  07/08/2022 due to strokelike symptoms.  Her family noticed confusion, difficulty with speech, facial droop and left-sided weakness.  They called EMS and she was noted to have an initial systolic blood pressure of 270.  CT of the head showed large acute intraparenchymal hematoma with associated subdural hematoma.  There was mass effect with 12 mm of midline shift.  Trauma history was elicited.  Neurology and neurosurgery were consulted.  She was started on Cleviprex infusion and Decadron 10 mg x 1 dose.  She was intubated for airway control.  Nasogastric tube was placed.  She was taken to the operating room where she underwent right frontotemporoparietal craniotomy and evacuation of intracerebral hemorrhage and right convexity subdural hematoma by Dr. Trenton Gammon.  Biopsy of suspicious tissue was obtained.  It was negative for malignant process.  Postoperative CT of the head on 1/10 demonstrated resolution of SDH postcraniotomy and some residual hemorrhage without evidence of mass effect.  She was started on hypertonic saline on 1/13.  She was extubated on 1/15.  Later that day her blood gases were significant for hypercapnia with respiratory rate of 32 she was reintubated.  Her left side remains plegic.  She again failed extubation on 1/18 and required reintubation.  Foley to care consultation was obtained on 1/19.  Family decided on DNR CODE STATUS.  On 1/23 she was tolerating pressure support and was extubated. On EGD with percutaneous endoscopic gastrostomy tube placement on 2/2 by Dr. Bobbye Morton. The patient requires inpatient physical medicine and rehabilitation evaluations and treatment secondary to dysfunction due to intracranial hemorrhage, subdural hematoma status postcraniotomy.Patient transferred to CIR on 08/02/2022 .   Patient currently  requires total with mobility secondary to muscle weakness, decreased cardiorespiratoy endurance, impaired timing and sequencing, abnormal tone, unbalanced muscle activation, motor  apraxia, ataxia, decreased coordination, and decreased motor planning, decreased visual perceptual skills and decreased visual motor skills, decreased midline orientation, decreased attention to left, left side neglect, and decreased motor planning, decreased initiation, decreased attention, decreased awareness, decreased problem solving, decreased safety awareness, decreased memory, and delayed processing, and decreased sitting balance, decreased standing balance, decreased postural control, and hemiplegia.  Prior to hospitalization, patient was independent  with mobility and lived with Spouse, Family in a House home.  Home access is 2Stairs to enter.  Patient will benefit from skilled PT intervention to maximize safe functional mobility, minimize fall risk, and decrease caregiver burden for planned discharge home with 24 hour assist.  Anticipate patient will benefit from follow up [facility] at discharge.  PT - End of Session Activity Tolerance: Tolerates 10 - 20 min activity with multiple rests Endurance Deficit: Yes Endurance Deficit Description: decreased PT Assessment Rehab Potential (ACUTE/IP ONLY): Fair PT Barriers to Discharge: Decreased caregiver support;Home environment access/layout PT Barriers to Discharge Comments: family working on 24/7 assist PT Patient demonstrates impairments in the following area(s): Balance;Pain;Perception;Safety;Endurance;Sensory;Motor;Skin Integrity;Nutrition PT Transfers Functional Problem(s): Bed Mobility;Bed to Chair;Car PT Locomotion Functional Problem(s): Ambulation;Wheelchair Mobility PT Plan PT Intensity: Minimum of 1-2 x/day ,45 to 90 minutes PT Frequency: 5 out of 7 days PT Duration Estimated Length of Stay: 2.5 - 3 weeks PT Treatment/Interventions: Ambulation/gait training;Cognitive remediation/compensation;Discharge planning;DME/adaptive equipment instruction;Functional mobility training;Pain management;Psychosocial support;Splinting/orthotics;Therapeutic  Activities;UE/LE Strength taining/ROM;Visual/perceptual remediation/compensation;Wheelchair propulsion/positioning;UE/LE Coordination activities;Therapeutic Exercise;Stair training;Skin care/wound management;Patient/family education;Neuromuscular re-education;Functional electrical stimulation;Disease management/prevention;Community reintegration;Balance/vestibular training PT Transfers Anticipated Outcome(s): Min A PT Locomotion Anticipated Outcome(s): Min A PT Recommendation Recommendations for Other Services: Other (comment) (TBD) Follow Up Recommendations: Home health PT Patient destination: Home Equipment Recommended: To be determined Equipment Details: owns no DME, spouse owns RW   PT Evaluation Precautions/Restrictions Precautions Precautions: Fall Precaution Comments: peg tube. HOB no lower than 30*. Left dense hemi. right gaze preference, left side inattention Required Braces or Orthoses: Other Brace Other Brace: PRAFO boots on in bed Restrictions Weight Bearing Restrictions: No General   Vital SignsTherapy Vitals Temp: (Abnormal) 100.4 F (38 C) Pulse Rate: 98 Resp: 18 BP: (Abnormal) 164/83 Patient Position (if appropriate): Lying Oxygen Therapy SpO2: 93 % O2 Device: Room Air Pain Pain Assessment Pain Scale: Faces Faces Pain Scale: No hurt Pain Interference Pain Interference Pain Effect on Sleep: 8. Unable to answer Pain Interference with Therapy Activities: 8. Unable to answer Pain Interference with Day-to-Day Activities: 8. Unable to answer Home Living/Prior Functioning Home Living Available Help at Discharge: Family;Available 24 hours/day Type of Home: House Home Access: Stairs to enter [employer] of Steps: 2 Home Layout: One level Bathroom Shower/Tub: Engineer, manufacturing systems: Handicapped height  Lives With: Spouse;Family Prior Function Level of Independence: Independent with gait;Independent with transfers  Able to Take Stairs?:  Yes Driving: Yes Vision/Perception  Vision - History Ability to See in Adequate Light: 1 Impaired Vision - Assessment Eye Alignment: Impaired (comment) Ocular Range of Motion: Restricted on the left;Restricted looking up;Other (comment) (head position limits ability to assess vision completely) Alignment/Gaze Preference: Chin down;Head turned;Gaze right Tracking/Visual Pursuits: Requires cues, head turns, or add eye shifts to track;Impaired - to be further tested in functional context Saccades: Within functional limits Convergence: Impaired - to be further tested in functional context  Cognition Overall Cognitive Status: Impaired/Different from baseline Arousal/Alertness: Lethargic Attention: Sustained Sustained Attention: Impaired Sustained Attention  Impairment: Verbal basic;Functional basic Memory: Impaired Awareness: Impaired Awareness Impairment: Intellectual impairment Problem Solving: Impaired Problem Solving Impairment: Functional basic;Verbal basic Safety/Judgment: Impaired Comments: left inattention Sensation Sensation Light Touch: Appears Intact Proprioception: Impaired by gross assessment Coordination Gross Motor Movements are Fluid and Coordinated: No Fine Motor Movements are Fluid and Coordinated: No Coordination and Movement Description: grossly uncoordinated 2/2 L hemi Finger Nose Finger Test: NT Heel Shin Test: NT Motor  Motor Motor: Hemiplegia;Abnormal postural alignment and control Motor - Skilled Clinical Observations: L hemi   Trunk/Postural Assessment  Cervical Assessment Cervical Assessment: Exceptions to [facility] (full cervical flexion with right rotation) Cervical AROM Overall Cervical AROM Comments: Able to demonstrate full cervical flexion and right side rotation only. A/ROM serverely impaired due to ROM restrictions and muscle tightness Thoracic Assessment Thoracic Assessment: Exceptions to [facility] (rounded shoulders, flexed posture) Lumbar  Assessment Lumbar Assessment: Exceptions to [facility] (posterior pelvic tilt) Postural Control Postural Control: Deficits on evaluation Trunk Control: impaired mod A  Balance Balance Balance Assessed: Yes Static Sitting Balance Static Sitting - Balance Support: Feet supported;No upper extremity supported Static Sitting - Level of Assistance: 3: Mod assist Dynamic Sitting Balance Dynamic Sitting - Balance Support: Feet supported;During functional activity Dynamic Sitting - Level of Assistance: 2: Max assist Extremity Assessment  RUE Assessment RUE Assessment: Exceptions to [facility] General Strength Comments: shoulder flexion 3-/5, abduction: 3-/5, elbow flexion/extension 4-/5, decreased gross grasp. RUE Strength RUE Overall Strength: Deficits LUE Assessment LUE Assessment: Exceptions to [facility] LUE Body System: Neuro Brunstrum levels for arm and hand: Arm;Hand Brunstrum level for arm: Stage I Presynergy Brunstrum level for hand: Stage I Flaccidity RLE Assessment RLE Assessment: Exceptions to [facility] General Strength Comments: grossly 2/5 LLE Assessment General Strength Comments: grossly 2/5  Care Tool Care Tool Bed Mobility Roll left and right activity   Roll left and right assist level: Total Assistance - Patient < 25%    Sit to lying activity   Sit to lying assist level: 2 Helpers    Lying to sitting on side of bed activity   Lying to sitting on side of bed assist level: the ability to move from lying on the back to sitting on the side of the bed with no back support.: 2 Helpers     Care Tool Transfers Sit to stand transfer   Sit to stand assist level: 2 Helpers    Chair/bed transfer Chair/bed transfer activity did not occur: Safety/medical concerns (unable 2/2 pain, fatigue, weakness)       Toilet transfer Toilet transfer activity did not occur: Safety/medical concerns (unable 2/2 pain, fatigue, weakness)      Car transfer Car transfer activity did not occur: Safety/medical  concerns (unable 2/2 pain, fatigue, weakness)        Care Tool Locomotion Ambulation Ambulation activity did not occur: Safety/medical concerns (unable 2/2 pain, fatigue, weakness)        Walk 10 feet activity Walk 10 feet activity did not occur: Safety/medical concerns       Walk 50 feet with 2 turns activity Walk 50 feet with 2 turns activity did not occur: Safety/medical concerns      Walk 150 feet activity Walk 150 feet activity did not occur: Safety/medical concerns      Walk 10 feet on uneven surfaces activity Walk 10 feet on uneven surfaces activity did not occur: Safety/medical concerns      Stairs Stair activity did not occur: Safety/medical concerns (unable 2/2 pain, fatigue, weakness)        Walk  up/down 1 step activity Walk up/down 1 step or curb (drop down) activity did not occur: Safety/medical concerns      Walk up/down 4 steps activity Walk up/down 4 steps activity did not occur: Safety/medical concerns      Walk up/down 12 steps activity Walk up/down 12 steps activity did not occur: Safety/medical concerns      Pick up small objects from floor Pick up small object from the floor (from standing position) activity did not occur: Safety/medical concerns (unable 2/2 pain, fatigue, weakness)      Wheelchair Is the patient using a wheelchair?: Yes Type of Wheelchair: Manual Wheelchair activity did not occur: Safety/medical concerns (unable 2/2 pain, fatigue, weakness)   Max wheelchair distance: 0  Wheel 50 feet with 2 turns activity Wheelchair 50 feet with 2 turns activity did not occur: Safety/medical concerns    Wheel 150 feet activity Wheelchair 150 feet activity did not occur: Safety/medical concerns      Refer to Care Plan for Long Term Goals  SHORT TERM GOAL WEEK 1 PT Short Term Goal 1 (Week 1): Pt will roll with max A or less PT Short Term Goal 2 (Week 1): Pt will perform supine to sit with max A PT Short Term Goal 3 (Week 1): Pt will require  min A for static sitting balance  Recommendations for other services: None   Skilled Therapeutic Intervention Mobility Bed Mobility Bed Mobility: Rolling Right;Rolling Left;Supine to Sit;Sit to Supine Rolling Right: Dependent - Patient equal 0% Rolling Left: Total Assistance - Patient < 25% Supine to Sit: 2 Helpers Sit to Supine: 2 Helpers Scooting to [facility]: 2 Helpers Locomotion  Gait Ambulation: No Gait Gait: No Stairs / Additional Locomotion Stairs: No Wheelchair Mobility Wheelchair Mobility: No  Daily Treatment Session:   Pt received semi-reclined in bed, agreeable to PT evaluation and responds to name. Pt with unrated right knee and abdominal pain near G tube insertion. Pt unable to verbalize but points to area of discomfort when asked if she is in pain. Pt provided with rest breaks, repositioning and nursing notified regarding pain medication administration.   Pt answered subjective and PLOF questions utilizing marker and whiteboard. PT confirmed pt's history with her daughter following session and pt with 100% accuracy.   Pt incontinent on PT arrival and requires total A with rolling to the left and +2 with rolling to the right. Pt dependent for peri-care.   Pt requires +2 assist with supine to sit. Initially pt requires max A and fades to intermittent mod A for static sitting balance EOB. Pt requires max A for dynamic sitting balance with dynamic reaching activities ipsilateral and contralateral. Pt with significant cervical flexion and right rotation and right gaze preference.   Pt requires max A for sit to stand attempt. Pt unable to attain fully upright position, but does clear buttocks from edge of bed. PT utilized music for encouragement and pt performed 4 additional sit to stand attempts.   Pt +2 for sit to supine and left semi-reclined in bed with all needs in reach and family present.   Discharge Criteria: Patient will be discharged from PT if patient refuses  treatment 3 consecutive times without medical reason, if treatment goals not met, if there is a change in medical status, if patient makes no progress towards goals or if patient is discharged from hospital.  The above assessment, treatment plan, treatment alternatives and goals were discussed and mutually agreed upon: by patient and by family  Verl Dicker  Verl Dicker PT, DPT  08/03/2022, 4:54 PM

## 2022-08-03 NOTE — Evaluation (Signed)
Occupational Therapy Assessment and Plan  Patient Details  Name: Gina Kelley MRN: [id number] Date of Birth: January 21, 1936  OT Diagnosis: abnormal posture, cognitive deficits, disturbance of vision, flaccid hemiplegia and hemiparesis, hemiplegia affecting non-dominant side, and muscle weakness (generalized) Rehab Potential: Rehab Potential (ACUTE ONLY): Good ELOS: 3 weeks   Today's Date: 08/03/2022 OT Individual Time: 8366-2947 OT Individual Time Calculation (min): 84 min     Hospital Problem: Principal Problem:   Subdural hematoma ([place])   Past Medical History:  Past Medical History:  Diagnosis Date   Anemia of chronic disease 07/08/2006   Antral ulcer 02/23/2007   Seen on EGD in 2008, small ulcer with erosion    Cerebral vascular accident ([place]) 04/14/2006   1998, left lower extremity numbness, no residual deficits    Essential hypertension 04/14/2006   Gastroesophageal reflux disease 02/18/2013   Occasional, symptomatically relieved with peptobismol    Healthcare maintenance 12/04/2011   Hyperlipidemia LDL goal < 100 04/14/2006   Hypertensive retinopathy of both eyes, grade 1 05/15/2016   Osteopenia of right femoral neck 10/20/2016   DEXA (10/16/2016): R femur T -2.5 (FRAX tool calculates at -2.4), L1-L4 spine T -0.9, 10 year risk for: Major osteoporotic fracture 8.3%, Hip fracture 2.7%   Type 2 diabetes mellitus with stage 1 chronic kidney disease, without long-term current use of insulin (HCC)    Type II diabetes mellitus ([place]) 04/14/2006   Past Surgical History:  Past Surgical History:  Procedure Laterality Date   COLONOSCOPY     CRANIOTOMY Right 07/08/2022   Procedure: RIGHT CRANIOTOMY HEMATOMA EVACUATION SUBDURAL;  Surgeon: Earnie Larsson, MD;  Location: [place];  Service: Neurosurgery;  Laterality: Right;   ESOPHAGOGASTRODUODENOSCOPY      Assessment & Plan Clinical Impression:  Gina Kelley is an 87 year old female who presented to the emergency department on 07/08/2022  due to strokelike symptoms.CT of the head showed large acute intraparenchymal hematoma with associated subdural hematoma.  There was mass effect with 12 mm of midline shift.  Right frontotemporoparietal craniotomy and evacuation of intracerebral hemorrhage and right convexity subdural hematoma by Dr. Trenton Gammon.  Patient transferred to CIR on 08/02/2022 .    Patient currently requires total with basic self-care skills secondary to muscle weakness and muscle paralysis, impaired timing and sequencing, unbalanced muscle activation, decreased coordination, and decreased motor planning, field cut, decreased midline orientation, decreased attention to left, left side neglect, and decreased motor planning, decreased initiation, decreased attention, decreased awareness, decreased problem solving, decreased safety awareness, decreased memory, and delayed processing, and decreased sitting balance, decreased standing balance, decreased postural control, hemiplegia, and decreased balance strategies.  Prior to hospitalization, patient could complete all BADL and IADL tasks with independent .  Patient will benefit from skilled intervention to decrease level of assist with basic self-care skills prior to discharge  home with family/friends assisting .  Anticipate patient will require intermittent supervision and follow up home health.  OT - End of Session Activity Tolerance: Decreased this session Endurance Deficit: No OT Assessment Rehab Potential (ACUTE ONLY): Good OT Barriers to Discharge: Nutrition means OT Barriers to Discharge Comments: Peg tube OT Patient demonstrates impairments in the following area(s): Balance;Cognition;Endurance;Motor;Pain;Perception;Safety;Sensory;Vision OT Basic ADL's Functional Problem(s): Grooming;Bathing;Dressing;Toileting OT Transfers Functional Problem(s): Toilet;Tub/Shower OT Additional Impairment(s): Fuctional Use of Upper Extremity OT Plan OT Intensity: Minimum of 1-2 x/day, 45 to  90 minutes OT Frequency: 5 out of 7 days OT Duration/Estimated Length of Stay: 3 weeks OT Treatment/Interventions: Balance/vestibular training;Cognitive remediation/compensation;Discharge planning;Community reintegration;DME/adaptive equipment instruction;Functional electrical stimulation;Neuromuscular  re-education;Functional mobility training;Pain management;Patient/family education;Psychosocial support;Self Care/advanced ADL retraining;Splinting/orthotics;Therapeutic Activities;Therapeutic Exercise;UE/LE Strength taining/ROM;UE/LE Coordination activities;Visual/perceptual remediation/compensation;Wheelchair propulsion/positioning OT Basic Self-Care Anticipated Outcome(s): Min A OT Toileting Anticipated Outcome(s): Mod A OT Bathroom Transfers Anticipated Outcome(s): Min A OT Recommendation Recommendations for Other Services: Speech consult Patient destination: Home Follow Up Recommendations: Home health OT Equipment Recommended: To be determined Equipment Details: Pt reports: shower chair, grab bars by toilet   OT Evaluation Precautions/Restrictions  Precautions Precautions: Fall Precaution Comments: peg tube. HOB no lower than 30*. Left dense hemi. right gaze preference, left side inattention Restrictions Weight Bearing Restrictions: No Pain Pain Assessment Pain Scale: 0-10 Home Living/Prior Functioning Home Living Family/patient expects to be discharged to:: Private residence Living Arrangements: Spouse/significant other, Children Type of Home: House Home Access: Stairs to enter Secretary/administrator of Steps: 4 Home Layout: One level Bathroom Shower/Tub: Engineer, manufacturing systems: Standard Prior Function Level of Independence: Independent with basic ADLs Vision Baseline Vision/History: 1 Wears glasses (for reading) Ability to See in Adequate Light: 1 Impaired Patient Visual Report: Other (comment) (unable to report) Vision Assessment?: Yes Eye Alignment: Impaired  (comment) Ocular Range of Motion: Restricted on the left;Restricted looking up;Other (comment) (head position limits ability to assess vision completely) Alignment/Gaze Preference: Chin down;Head turned;Gaze right Tracking/Visual Pursuits: Requires cues, head turns, or add eye shifts to track;Impaired - to be further tested in functional context Saccades: Within functional limits Convergence: Impaired - to be further tested in functional context Visual Fields: Impaired-to be further tested in functional context Perception  Perception: Impaired Inattention/Neglect: Does not attend to left visual field;Does not attend to left side of body Spatial Orientation: impaired Praxis Praxis: Impaired Praxis Impairment Details: Motor planning Cognition Cognition Overall Cognitive Status: Difficult to assess (due to expressive difficulties) Arousal/Alertness: Awake/alert Memory:  (unable to assess) Awareness: Impaired Problem Solving: Impaired Safety/Judgment: Impaired Brief Interview for Mental Status (BIMS) Repetition of Three Words (First Attempt): No answer Temporal Orientation: Year: No answer Temporal Orientation: Month: No answer Temporal Orientation: Day: No answer Recall: "Sock": No answer Recall: "Blue": No answer Recall: "Bed": No answer BIMS Summary Score: 99 Sensation Sensation Light Touch: Appears Intact Coordination Gross Motor Movements are Fluid and Coordinated: No Fine Motor Movements are Fluid and Coordinated: No Finger Nose Finger Test: NT 9 Hole Peg Test: NT Motor  Motor Motor: Hemiplegia;Abnormal postural alignment and control  Trunk/Postural Assessment  Cervical Assessment Cervical Assessment: Exceptions to [facility] Cervical AROM Overall Cervical AROM Comments: Able to demonstrate full cervical flexion and right side rotation only. A/ROM serverely impaired due to ROM restrictions and muscle tightness Cervical PROM Overall Cervical PROM Comments: Unable to  passively range neck beyond what pt is able to achieve actively Thoracic Assessment Thoracic Assessment: Exceptions to [facility] Lumbar Assessment Lumbar Assessment: Exceptions to [facility] (rounded shoulders, severe scapular protraction bilaterally) Postural Control Postural Control: Deficits on evaluation (delayed and impaired)  Balance Balance Balance Assessed: Yes Static Sitting Balance Static Sitting - Balance Support: Feet supported;No upper extremity supported Static Sitting - Level of Assistance: 3: Mod assist (sitting EOB) Static Standing Balance Static Standing - Balance Support: No upper extremity supported;During functional activity Static Standing - Level of Assistance: 1: +1 Total assist;1: +2 Total assist Extremity/Trunk Assessment RUE Assessment RUE Assessment: Exceptions to [facility] General Strength Comments: shoulder flexion 3-/5, abduction: 3-/5, elbow flexion/extension 4-/5, decreased gross grasp. RUE Strength RUE Overall Strength: Deficits LUE Assessment LUE Assessment: Exceptions to [facility] LUE Body System: Neuro Brunstrum levels for arm and hand: Arm;Hand Brunstrum level for arm: Stage I  Presynergy Brunstrum level for hand: Stage I Flaccidity  Care Tool Care Tool Self Care Eating Eating activity did not occur: Safety/medical concerns Eating Assist Level: Dependent - Patient 0% (during trials with SLP)    Oral Care    Oral Care Assist Level: Dependent - Patient 0%)    Bathing   Body parts bathed by patient: Face Body parts bathed by helper: Left lower leg;Right lower leg;Left upper leg;Right upper leg;Buttocks;Front perineal area;Abdomen;Chest;Left arm;Right arm   Assist Level: Total Assistance - Patient < 25%    Upper Body Dressing(including orthotics)   What is the patient wearing?: Pull over shirt   Assist Level: Total Assistance - Patient < 25%    Lower Body Dressing (excluding footwear)   What is the patient wearing?: Pants;Incontinence brief Assist for lower  body dressing: Dependent - Patient 0%    Putting on/Taking off footwear   What is the patient wearing?: Non-skid slipper socks Assist for footwear: Dependent - Patient 0%       Care Tool Toileting Toileting activity   Assist for toileting: Dependent - Patient 0%     Care Tool Bed Mobility Roll left and right activity   Roll left and right assist level: Total Assistance - Patient < 25%    Sit to lying activity   Sit to lying assist level: 2 Helpers    Lying to sitting on side of bed activity   Lying to sitting on side of bed assist level: the ability to move from lying on the back to sitting on the side of the bed with no back support.: Total Assistance - Patient < 25%     Care Tool Transfers Sit to stand transfer   Sit to stand assist level: 2 Helpers    Chair/bed transfer Chair/bed transfer activity did not occur: Safety/medical concerns (unable 2/2 pain, fatigue, weakness) Chair/bed transfer assist level: 2 Armed forces training and education officer transfer activity did not occur: Safety/medical concerns (unable 2/2 pain, fatigue, weakness) Assist Level: 2 Helpers     Care Tool Cognition  Expression of Ideas and Wants Expression of Ideas and Wants: 1. Rarely/Never expressess or very difficult - rarely/never expresses self or speech is very difficult to understand  Understanding Verbal and Non-Verbal Content Understanding Verbal and Non-Verbal Content: 2. Sometimes understands - understands only basic conversations or simple, direct phrases. Frequently requires cues to understand   Memory/Recall Ability Memory/Recall Ability : None of the above were recalled   Refer to Care Plan for Long Term Goals  SHORT TERM GOAL WEEK 1 OT Short Term Goal 1 (Week 1): Pt will complete UB bathing with Max A seated on EOB or at sink. OT Short Term Goal 2 (Week 1): Pt will complete LB bating with Max A seated on EOB or at sink. OT Short Term Goal 3 (Week 1): Pt will increase bed mobility while  transitioning to seated on EOB from supine with Max A. OT Short Term Goal 4 (Week 1): Pt will increase static sitting balance to [employer] while completing functional self care task. OT Short Term Goal 5 (Week 1): Pt will increase attention to her immediate environment by locating all items needed for grooming task when placed to her left.  Recommendations for other services: None    Skilled Therapeutic Intervention ADL ADL Eating: NPO Grooming: Dependent Where Assessed-Grooming: Wheelchair Upper Body Bathing: Dependent Where Assessed-Upper Body Bathing: Sitting at sink;Wheelchair;Bed level Lower Body Bathing: Dependent Where Assessed-Lower Body Bathing: Bed level Upper Body  Dressing: Dependent Where Assessed-Upper Body Dressing: Wheelchair;Sitting at sink;Bed level Lower Body Dressing: Dependent Where Assessed-Lower Body Dressing: Bed level Toileting: Dependent Where Assessed-Toileting: Bed level Toilet Transfer: Dependent Toilet Transfer Method: Squat pivot Tub/Shower Transfer: Not assessed Social research officer, government: Not assessed Mobility  Bed Mobility Bed Mobility: Rolling Right;Supine to Sit;Sit to Supine;Scooting to [facility] Rolling Right: Dependent - Patient equal 0% Supine to Sit: Dependent - Patient equal 0% Sit to Supine: Dependent - Patient equal 0% Scooting to [facility]: Dependent - Patient equal 0%   Discharge Criteria: Patient will be discharged from OT if patient refuses treatment 3 consecutive times without medical reason, if treatment goals not met, if there is a change in medical status, if patient makes no progress towards goals or if patient is discharged from hospital.  The above assessment, treatment plan, treatment alternatives and goals were discussed and mutually agreed upon: by patient  Ailene Ravel, OTR/L,CBIS  Supplemental OT - [facility] Secure Chat Preferred   08/03/2022, 7:28 PM

## 2022-08-04 DIAGNOSIS — R1312 Dysphagia, oropharyngeal phase: Secondary | ICD-10-CM | POA: Diagnosis not present

## 2022-08-04 DIAGNOSIS — R414 Neurologic neglect syndrome: Secondary | ICD-10-CM | POA: Diagnosis not present

## 2022-08-04 DIAGNOSIS — I10 Essential (primary) hypertension: Secondary | ICD-10-CM | POA: Diagnosis not present

## 2022-08-04 DIAGNOSIS — S065XAA Traumatic subdural hemorrhage with loss of consciousness status unknown, initial encounter: Secondary | ICD-10-CM | POA: Diagnosis not present

## 2022-08-04 LAB — GLUCOSE, CAPILLARY
Glucose-Capillary: 146 mg/dL — ABNORMAL HIGH (ref 70–99)
Glucose-Capillary: 151 mg/dL — ABNORMAL HIGH (ref 70–99)
Glucose-Capillary: 153 mg/dL — ABNORMAL HIGH (ref 70–99)
Glucose-Capillary: 159 mg/dL — ABNORMAL HIGH (ref 70–99)
Glucose-Capillary: 191 mg/dL — ABNORMAL HIGH (ref 70–99)

## 2022-08-04 LAB — BASIC METABOLIC PANEL
Anion gap: 7 (ref 5–15)
BUN: 22 mg/dL (ref 8–23)
CO2: 28 mmol/L (ref 22–32)
Calcium: 8.1 mg/dL — ABNORMAL LOW (ref 8.9–10.3)
Chloride: 99 mmol/L (ref 98–111)
Creatinine, Ser: 0.62 mg/dL (ref 0.44–1.00)
GFR, Estimated: >60 mL/min (ref >60)
Glucose, Bld: 146 mg/dL — ABNORMAL HIGH (ref 70–99)
Potassium: 3.5 mmol/L (ref 3.5–5.1)
Sodium: 134 mmol/L — ABNORMAL LOW (ref 135–145)

## 2022-08-04 LAB — CBC
HCT: 21.4 % — ABNORMAL LOW (ref 36.0–46.0)
Hemoglobin: 6.8 g/dL — CL (ref 12.0–15.0)
MCH: 28.2 pg (ref 26.0–34.0)
MCHC: 31.8 g/dL (ref 30.0–36.0)
MCV: 88.8 fL (ref 80.0–100.0)
Platelets: 384 10*3/uL (ref 150–400)
RBC: 2.41 MIL/uL — ABNORMAL LOW (ref 3.87–5.11)
RDW: 17.2 % — ABNORMAL HIGH (ref 11.5–15.5)
WBC: 10.2 10*3/uL (ref 4.0–10.5)
nRBC: 0 % (ref 0.0–0.2)

## 2022-08-04 MED ORDER — CALCIUM POLYCARBOPHIL 625 MG PO TABS
625.0000 mg | ORAL_TABLET | Freq: Every day | ORAL | Status: DC
  Administered 2022-08-04 – 2022-08-19 (×16): 625 mg
  Filled 2022-08-04 (×17): qty 1

## 2022-08-04 MED ORDER — ZINC SULFATE 220 (50 ZN) MG PO CAPS
220.0000 mg | ORAL_CAPSULE | Freq: Every day | ORAL | Status: AC
  Administered 2022-08-05 – 2022-08-18 (×14): 220 mg
  Filled 2022-08-04 (×14): qty 1

## 2022-08-04 MED ORDER — FERROUS SULFATE 300 (60 FE) MG/5ML PO SOLN
300.0000 mg | Freq: Every day | ORAL | Status: DC
  Administered 2022-08-05 – 2022-08-22 (×18): 300 mg
  Filled 2022-08-04 (×18): qty 5

## 2022-08-04 MED ORDER — JUVEN PO PACK
1.0000 | PACK | Freq: Two times a day (BID) | ORAL | Status: DC
  Administered 2022-08-04 – 2022-08-22 (×37): 1
  Filled 2022-08-04 (×37): qty 1

## 2022-08-04 MED ORDER — ZINC SULFATE 220 (50 ZN) MG PO CAPS
220.0000 mg | ORAL_CAPSULE | Freq: Every day | ORAL | Status: DC
  Administered 2022-08-04: 220 mg
  Filled 2022-08-04: qty 1

## 2022-08-04 MED ORDER — GLUCERNA 1.5 CAL PO LIQD
1000.0000 mL | ORAL | Status: DC
  Administered 2022-08-05 – 2022-08-14 (×9): 1000 mL
  Filled 2022-08-04 (×16): qty 1000

## 2022-08-04 MED ORDER — VITAMIN C 500 MG PO TABS
500.0000 mg | ORAL_TABLET | Freq: Every day | ORAL | Status: DC
  Administered 2022-08-05 – 2022-08-22 (×18): 500 mg
  Filled 2022-08-04 (×18): qty 1

## 2022-08-04 MED ORDER — FAMOTIDINE 20 MG PO TABS
20.0000 mg | ORAL_TABLET | Freq: Two times a day (BID) | ORAL | Status: DC
  Administered 2022-08-04 – 2022-08-22 (×36): 20 mg
  Filled 2022-08-04 (×36): qty 1

## 2022-08-04 MED ORDER — L-METHYLFOLATE-B6-B12 3-35-2 MG PO TABS
1.0000 | ORAL_TABLET | Freq: Every day | ORAL | Status: DC
  Administered 2022-08-04 – 2022-08-22 (×19): 1
  Filled 2022-08-04 (×19): qty 1

## 2022-08-04 MED ORDER — VITAMIN C 500 MG PO TABS
500.0000 mg | ORAL_TABLET | Freq: Every day | ORAL | Status: DC
  Administered 2022-08-04: 500 mg
  Filled 2022-08-04: qty 1

## 2022-08-04 NOTE — Progress Notes (Addendum)
PROGRESS NOTE   Subjective/Complaints:  Pt working with OT this morning. Appears to be in good spirits. Indicates that she doesn't have any pain and slept well. Still having loose stool.   ROS: limited due to language/communication   Objective:   No results found. Recent Labs    08/02/22 1456 08/04/22 0649  WBC 12.3* 10.2  HGB 7.2* 6.8*  HCT 21.7* 21.4*  PLT 402* 384   Recent Labs    08/02/22 1456 08/04/22 0649  NA 135 134*  K 3.7 3.5  CL 99 99  CO2 27 28  GLUCOSE 151* 146*  BUN 20 22  CREATININE 0.61 0.62  CALCIUM 8.2* 8.1*    Intake/Output Summary (Last 24 hours) at 08/04/2022 1054 Last data filed at 08/04/2022 8786 Gross per 24 hour  Intake --  Output 1125 ml  Net -1125 ml     Pressure Injury 07/17/22 Sacrum Left Stage 2 -  Partial thickness loss of dermis presenting as a shallow open injury with a red, pink wound bed without slough. (Active)  07/17/22 1700  Location: Sacrum  Location Orientation: Left  Staging: Stage 2 -  Partial thickness loss of dermis presenting as a shallow open injury with a red, pink wound bed without slough.  Wound Description (Comments):   Present on Admission: No    Physical Exam: Vital Signs Blood pressure (!) 147/75, pulse 92, temperature 99.3 F (37.4 C), resp. rate 16, height 5\' 3"  (1.6 m), weight 93.3 kg, SpO2 90 %.  Constitutional: No distress . Vital signs reviewed. HEENT: NCAT, lip swelling, particular lower. Able to move tongue past lips.  Neck: supple Cardiovascular: RRR without murmur. No JVD    Respiratory/Chest: CTA Bilaterally without wheezes or rales. Normal effort    GI/Abdomen: BS +, non-tender, PEG site intact with minimal drainage Ext: no clubbing, cyanosis, or edema Psych: pleasant and cooperative  GU: Not examined. +Foley, straw colored urine.  Skin: C/D/I. No apparent lesions. +IV, c/d/I. +Peg c/d/i + coccyx scab - no longer wound per nursing (see  above)  MSK:      No apparent deformity.      Strength:                LUE: Flaccid with ? 2/5 elbow extension                LLE:  4-/5 proximal, 3/5 distal   Neurologic exam:  Cognition: AAO to person, place; time with options; alert. Follows simple commands, reacts to verbal input. Cannot verbalize but communicates with gestures/hands. Follows directions Insight: Fair insight into current condition. Knows she had a stroke.   + LUE Hoffman's; +L facial droop. + eyes dont deviate past midline L. + L shoulder weakness--no change Spasticity: Neck neutral today 2/5  Assessment/Plan: 1. Functional deficits which require 3+ hours per day of interdisciplinary therapy in a comprehensive inpatient rehab setting. Physiatrist is providing close team supervision and 24 hour management of active medical problems listed below. Physiatrist and rehab team continue to assess barriers to discharge/monitor patient progress toward functional and medical goals  Care Tool:  Bathing    Body parts bathed by patient: Face   Body  parts bathed by helper: Left lower leg, Right lower leg, Left upper leg, Right upper leg, Buttocks, Front perineal area, Abdomen, Chest, Left arm, Right arm     Bathing assist Assist Level: Total Assistance - Patient < 25%     Upper Body Dressing/Undressing Upper body dressing   What is the patient wearing?: Pull over shirt    Upper body assist Assist Level: Total Assistance - Patient < 25%    Lower Body Dressing/Undressing Lower body dressing      What is the patient wearing?: Pants, Incontinence brief     Lower body assist Assist for lower body dressing: Dependent - Patient 0%     Toileting Toileting    Toileting assist Assist for toileting: Dependent - Patient 0%     Transfers Chair/bed transfer  Transfers assist  Chair/bed transfer activity did not occur: Safety/medical concerns (unable 2/2 pain, fatigue, weakness)  Chair/bed transfer assist level: 2  Helpers     Locomotion Ambulation   Ambulation assist   Ambulation activity did not occur: Safety/medical concerns (unable 2/2 pain, fatigue, weakness)          Walk 10 feet activity   Assist  Walk 10 feet activity did not occur: Safety/medical concerns        Walk 50 feet activity   Assist Walk 50 feet with 2 turns activity did not occur: Safety/medical concerns         Walk 150 feet activity   Assist Walk 150 feet activity did not occur: Safety/medical concerns         Walk 10 feet on uneven surface  activity   Assist Walk 10 feet on uneven surfaces activity did not occur: Safety/medical concerns         Wheelchair     Assist Is the patient using a wheelchair?: Yes Type of Wheelchair: Manual Wheelchair activity did not occur: Safety/medical concerns (unable 2/2 pain, fatigue, weakness)    Max wheelchair distance: 0    Wheelchair 50 feet with 2 turns activity    Assist    Wheelchair 50 feet with 2 turns activity did not occur: Safety/medical concerns       Wheelchair 150 feet activity     Assist  Wheelchair 150 feet activity did not occur: Safety/medical concerns       Blood pressure (!) 147/75, pulse 92, temperature 99.3 F (37.4 C), resp. rate 16, height 5\' 3"  (1.6 m), weight 93.3 kg, SpO2 90 %.    Medical Problem List and Plan: 1. Functional deficits secondary to subdural hematoma and ICH             -patient may shower with PEG site covered             -ELOS/Goals: 14-16 days, mod assist PT, mod assist OT, mod assist SLP              - Does well communicating with writing board and fingers for y/n             - Family requests change from DNR to full code on admission; changed   -Continue CIR therapies including PT, OT, and SLP  2.  Antithrombotics: -DVT/anticoagulation:  Mechanical:  Antiembolism stockings, knee (TED hose) Bilateral lower extremities             -antiplatelet therapy: none   3. Pain  Management: Tylenol as needed    4. Mood/Behavior/Sleep: LCSW to evaluate and provide emotional support             -  antipsychotic agents: n/a   5. Neuropsych/cognition: This patient is not capable of making decisions on her own behalf.   6. Skin/Wound Care: Routine skin care checks - coccyx PI with scab per nursing report - continue foley trial for now given no bed mobility until completely healed   7. Fluids/Electrolytes/Nutrition: Routine Is and Os and follow-up chemistries             -NPO; has PEG             -continue Glucerna 1.5 continuous Tfs              - Consult dietary to follow for titration   8: Hypertension: monitor TID and prn             -continue amlodipine 10 mg daily             -losartan 50 mg daily -> Dced 2/3 d/t concern for angioedema               - Add hydralazine 10 mg PRN for SBP >180, DBP>100    08/04/2022    4:38 AM 08/04/2022    4:18 AM 08/04/2022    4:15 AM  Vitals with BMI  Weight 205 lbs 11 oz    BMI 95.18    Systolic  841 660  Diastolic  75 75  Pulse  92 95   2/4: BP stable/high; mild tachycardia this AM; if AM labs do not show dehydration, would start HCTZ 12.5 mg daily for adjunctive control  2/5 BP with improvement over last 12 hours--will hold off on adding any further medication right now. 9: Hyperlipidemia: continue statin, Zetia   10: SDH/ICH s/p crani: Finished Keppra for sz ppx.    11: Leukocytosis: current temperature 99.3, ceftazidime completed 1/29             -Follow-up CBC with differential, mild WBC increase 2.2 likely d/t POD#1; downtrending 2/3               - Fever overnight prior to admission; POD from PEG, monitor for recurrence  - No fever recurrence 2/3; monitor   12: Anemia, multifactorial:               -Follow-up CBC -hgb with sl decrease again today, normocytic  -check stool for OB  -PA added b-supp. Add Fe++ too   13. ?Angioedema vs. Chlorhexadine reaction             - Improving per family after DC chlorhexidine  skin wipes and oral mouthwash              - Per SLP notes, interferes with tongue movement, speaking             - Hx cough with Enalipril, Losartan increased from OP 25 mg to 50 mg 1/14, never weaned; edema started 1/30 per chart review             - d/w pharmacy 2/4 and agree that reaction can occur at any time, and may cross-react with ARB. Will attempt to titrate BP meds without ACE/ARBs and monitor swelling.   14. L hemineglect - causing R gaze preference and positional tone             - Repositioning in bed to straighten head--head more neutral today 2/5             - May need to consider botox given need to recover cognitive function and relatively little tone elsewhere  LOS: 2 days A FACE TO FACE EVALUATION WAS PERFORMED  Ranelle Oyster 08/04/2022, 10:54 AM

## 2022-08-04 NOTE — Plan of Care (Signed)
Wound Plan   Braden Score: 13  Sensory: 3  Moisture: 2  Activity: 2  Mobility: 2  Nutrition: 3  Friction: 1   Wounds present:   Pressure Injury 07/17/22: Stage II Left Sacrum  POA   Interventions:   Air Mattress Glucerna 1.5 Registered Dietician Roho Cushion Ask PA for zinc and other supplements to increase protein for wound healing.   Attendees/Contributors  Tacy Learn, RN Peri Jefferson, LPN Erasmo Score, OT [place], [place], [place]

## 2022-08-04 NOTE — Progress Notes (Addendum)
Occupational Therapy Session Note  Patient Details  Name: AISHI COURTS MRN: [id number] Date of Birth: 06-24-1936  Today's Date: 08/04/2022 Session  1 OT Individual Time: 1010-1110 OT Individual Time Calculation (min): 60 min   Session 2 OT Individual Time: 5056-9794 OT Individual Time Calculation (min): 30 min    Short Term Goals: Week 1:  OT Short Term Goal 1 (Week 1): Pt will complete UB bathing with Max A seated on EOB or at sink. OT Short Term Goal 2 (Week 1): Pt will complete LB bating with Max A seated on EOB or at sink. OT Short Term Goal 3 (Week 1): Pt will increase bed mobility while transitioning to seated on EOB from supine with Max A. OT Short Term Goal 4 (Week 1): Pt will increase static sitting balance to [employer] while completing functional self care task. OT Short Term Goal 5 (Week 1): Pt will increase attention to her immediate environment by locating all items needed for grooming task when placed to her left.  Skilled Therapeutic Interventions/Progress Updates:  Session 1   Pt greeted semi-reclined in bed with oldest daughter present. Pt incontinent of BM. +2 needed for rolling and total A peri-care brief change and total A to don pants. This took extended time as pt constantly having loose BM. Pt came to sitting EOB with total A of 1 person. Worked on sitting balance with pt initially needing max A, progressing to min intermitent CGA. Pt with lateral and posterior LOB's. Squat-pivot transfer completed to the L side with max/total A of 1 person. Pt held on tightly to bed rail making transfer much more difficult. Her daughter ended up having to help let go. Pt tilted In TIS and wc seat belt was placed. Daughter present and needs met.   Session 2 Pt greeted seated in TIS wc with family present. Family left for the remainder of session. Pt brought to the sink and worked on visual scanning to the L to locate warm water knob. Pt needed moderate cues look past midline to  turn on water. Pt then needed max multimodal cues and hand over hand A to initiate washing face. PT demonstrated some apraxia within functional task. Slideboard transfer back to bed with total A +2. Pt with some bowel incontinence requiring total A +2 for peri care and brief change. Pt left semi-reclined in bed with needs met.    Therapy Documentation Precautions:  Precautions Precautions: Fall Precaution Comments: peg tube. HOB no lower than 30*. Left dense hemi. right gaze preference, left side inattention Required Braces or Orthoses: Other Brace Other Brace: PRAFO boots on in bed Restrictions Weight Bearing Restrictions: No Pain:   L shoulder, rest and repositioned   Therapy/Group: Individual Therapy  Valma Cava 08/04/2022, 2:59 PM

## 2022-08-04 NOTE — Progress Notes (Signed)
Inpatient Rehabilitation  Patient information reviewed and entered into eRehab system by Yanely Mast Vasili Fok, OTR/L, Rehab Quality Coordinator.   Information including medical coding, functional ability and quality indicators will be reviewed and updated through discharge.   

## 2022-08-04 NOTE — Progress Notes (Signed)
Inpatient Rehabilitation Care Coordinator Assessment and Plan Patient Details  Name: GIAVONNA PFLUM MRN: [id number] Date of Birth: 07-31-1935  Today's Date: 08/04/2022  Hospital Problems: Principal Problem:   Subdural hematoma [facility])  Past Medical History:  Past Medical History:  Diagnosis Date   Anemia of chronic disease 07/08/2006   Antral ulcer 02/23/2007   Seen on EGD in 2008, small ulcer with erosion    Cerebral vascular accident ([place]) 04/14/2006   1998, left lower extremity numbness, no residual deficits    Essential hypertension 04/14/2006   Gastroesophageal reflux disease 02/18/2013   Occasional, symptomatically relieved with [place] maintenance 12/04/2011   Hyperlipidemia LDL goal < 100 04/14/2006   Hypertensive retinopathy of both eyes, grade 1 05/15/2016   Osteopenia of right femoral neck 10/20/2016   DEXA (10/16/2016): R femur T -2.5 (FRAX tool calculates at -2.4), L1-L4 spine T -0.9, 10 year risk for: Major osteoporotic fracture 8.3%, Hip fracture 2.7%   Type 2 diabetes mellitus with stage 1 chronic kidney disease, without long-term current use of insulin ([place])    Type II diabetes mellitus ([place]) 04/14/2006   Past Surgical History:  Past Surgical History:  Procedure Laterality Date   COLONOSCOPY     CRANIOTOMY Right 07/08/2022   Procedure: RIGHT CRANIOTOMY HEMATOMA EVACUATION SUBDURAL;  Surgeon: Earnie Larsson, MD;  Location: [place];  Service: Neurosurgery;  Laterality: Right;   ESOPHAGOGASTRODUODENOSCOPY     Social History:  reports that she quit smoking about 44 years ago. Her smoking use included cigarettes. She has never used smokeless tobacco. She reports that she does not drink alcohol and does not use drugs.  Family / Support Systems Marital Status: Married How Long?: 60+ yrs Patient Roles: Spouse, Parent Spouse/Significant Other: Sudley (husband) Children: 4 children- 1) Rosemary (dtr; lives in [place]), 2) Dwyane Luo (dtr; lives in [place],  [place]), 3) Vaughan Basta (dtr, known as 'Collie Siad'; lives in [place] area), and 4 ) Bo (son; lives in [place], [place]). Other Supports: None reported Anticipated Caregiver: TBD. Family is working towards establishing 24/7 care as not in place yet. Children intend to return closer toward discharge to help PRN. Amount of support is not determined yet. Family provided a Advertising copywriter. Ability/Limitations of Caregiver: All family does not live in the area. Pt dtr Francesca Jewett does intermittent check-ins periodically throughout the day. Caregiver Availability: Intermittent Family Dynamics: pt lives with her husband.  Social History Preferred language: English Religion: Holiness Cultural Background: Pt was volunteering at [employer] a week and participating in church activites until this occurred. Education: 7th grade Health Literacy - How often do you need to have someone help you when you read instructions, pamphlets, or other written material from your doctor or pharmacy?: Never Writes: Yes Employment Status: Retired Date Retired/Disabled/Unemployed: 2019 Age Retired: 53 Public relations account executive Issues: Denies Guardian/Conservator: N/A   Abuse/Neglect Abuse/Neglect Assessment Can Be Completed: Unable to assess, patient is non-responsive or altered mental status Physical Abuse: Denies Verbal Abuse: Denies Sexual Abuse: Denies Exploitation of patient/patient's resources: Denies Self-Neglect: Denies  Patient response to: Social Isolation - How often do you feel lonely or isolated from those around you?: Patient unable to respond  Emotional Status Pt's affect, behavior and adjustment status: pt in good spirits despite inability to communicate at all times. Recent Psychosocial Issues: Pt family denies Psychiatric History: Pt family denies Substance Abuse History: Pt family denies  Patient / Family Perceptions, Expectations & Goals Pt/Family understanding of illness & functional  limitations: Pt family has a general  understanding of pt care needs Premorbid pt/family roles/activities: Independent Anticipated changes in roles/activities/participation: Assistance with ADLs/IADLs Pt/family expectations/goals: Patient's family goal is for her to be able to sit up on her own, standup, and speak more if possible, and perform own personal care. Reports pt is very modest.  Recruitment consultant: None Premorbid Home Care/DME Agencies: None Transportation available at discharge: TBD Is the patient able to respond to transportation needs?: Yes In the past 12 months, has lack of transportation kept you from medical appointments or from getting medications?: No In the past 12 months, has lack of transportation kept you from meetings, work, or from getting things needed for daily living?: No Resource referrals recommended: Neuropsychology  Discharge Planning Living Arrangements: Spouse/significant other Support Systems: Spouse/significant other, Children Type of Residence: Private residence Insurance Resources: Multimedia programmer (specify) ([place]) Financial Resources: Social Security Financial Screen Referred: No Living Expenses: Own Money Management: Patient, Spouse Does the patient have any problems obtaining your medications?: No Home Management: Pt and spouse managed home care needs. Patient/Family Preliminary Plans: TBD Care Coordinator Barriers to Discharge: Decreased caregiver support, Insurance for [name] coverage, Lack of/limited family support Care Coordinator Anticipated Follow Up Needs: HH/OP Expected length of stay: 17-21 days  Clinical Impression SW met with pt and pt family in room. Family helped complete assessment. Pt is not a English as a second language teacher. No HCPOA on file. No DME.   Rana Snare 08/04/2022, 8:28 PM

## 2022-08-04 NOTE — Progress Notes (Signed)
Physical Therapy Session Note  Patient Details  Name: Gina Kelley MRN: [id number] Date of Birth: [birth year]  Today's Date: 08/04/2022 PT Individual Time: 0800-0900 PT Individual Time Calculation (min): 60 min   Short Term Goals: Week 1:  PT Short Term Goal 1 (Week 1): Pt will roll with max A or less PT Short Term Goal 2 (Week 1): Pt will perform supine to sit with max A PT Short Term Goal 3 (Week 1): Pt will require min A for static sitting balance  Skilled Therapeutic Interventions/Progress Updates:  Patient greeted supine in bed with NT and RN present to administer medication and perform pericare after incontinent BM episode. Patient transitioned from supine to sitting EOB with Mod/MaxA for managing B LE and trunk. While sitting EOB, patient initially required Min/ModA for sitting balance with notable posterior and L lateral lean. Rehab tech present to assist with positioning of the TIS wheelchair. Patient performed squat pivot to/from EOB and wheelchair with Max/TotalA- VC for leaning her head toward therapist's hip with proper head/hips relationship. Patient requesting to communicate via whiteboard prior to heading to the rehab gym- Patient wrote "I don't like [name]." With further questions, was able to decipher this is an acquaintance and she does not want this individual to visit her while in the hospital- Notified RN regarding this information.   Patient wheeled to rehab gym and performed squat pivot transfer to/from wheelchair and mat table- Same transfer performed as above. While sitting edge of mat table (~10 minutes), patient worked on sitting balance with rehab tech positioned behind her providing CGA/SBA for safety. Patient tasked with reaching outside her BOS for 10 different squigz with emphasis on visually tracking each item to her left and reaching across her body and then handing the object back to therapist. Patient at times required MinA for improved midline posturing when  reaching far outside her BOS to the L. Patient transitioned to propped on R forearm 2 x 1 minute in order to improve overall midline posturing. Patient transferred back to wheelchair via squat pivot and returned to her room and noted to have another incontinent BM. Patient transferred back to bed via squat pivot and then transitioned from sitting EOB to supine with MaxA. Patient left supine in bed with NT notified and agreeable to assisting patient with pericare.    Therapy Documentation Precautions:  Precautions Precautions: Fall Precaution Comments: peg tube. HOB no lower than 30*. Left dense hemi. right gaze preference, left side inattention Required Braces or Orthoses: Other Brace Other Brace: PRAFO boots on in bed Restrictions Weight Bearing Restrictions: No   Therapy/Group: Individual Therapy  Sultana Tierney 08/04/2022, 9:45 AM

## 2022-08-04 NOTE — Progress Notes (Addendum)
Initial Nutrition Assessment  DOCUMENTATION CODES:   Non-severe (moderate) malnutrition in context of chronic illness  INTERVENTION:  Will adjust tube feeds via G-tube to account for being held during therapy (held ~4 hours daily): -Provide Glucerna 1.5 at 55 mL/hour -When runs for 20 hours daily will provide: 1650 kcal, 91 grams of protein, 880 mL H2O daily  With free water flush of 200 mL every 6 hours, pt receiving a total of 1680 mL H2O daily including water in tube feeds.  Provide Juven 1 packet per tube BID. Each packet provides 95 kcal, 7 grams L-Arginine, 7 grams L-Glutamine, 2.5 grams collagen protein, 300 mg vitamin C, 9.5 mg zinc, and other micronutrients essential for wound healing.  Noted vitamin C 500 mg daily per tube and zinc sulfate 220 mg daily per tube was ordered today by provider. Recommend stop date of 1 month for vitamin C and 2 weeks for zinc sulfate.  Recommend measuring weight as suspect current weight inaccurate.  NUTRITION DIAGNOSIS:   Moderate Malnutrition related to chronic illness as evidenced by moderate fat depletion, moderate muscle depletion, severe muscle depletion.  GOAL:   Patient will meet greater than or equal to 90% of their needs  MONITOR:   Labs, Weight trends, TF tolerance, Skin, I & O's  REASON FOR ASSESSMENT:   Consult Enteral/tube feeding initiation and management, Assessment of nutrition requirement/status, Wound healing  ASSESSMENT:   87 year old female with PMHx of DM, HTN, HLD, CKD admitted with ICH and SDH s/p craniotomy.  Recent pertinent events: 1/9: s/p r crani and biopsy of suspicious tissue worrisome for metastatic disease per MD 1/15: extubated, re-intubated later in the day; s/p Cortrak placement 1/18: extubated, almost immediately re-intubated 1/23: one-way extubation 2/2: G-tube placed 2/3: admitted to inpatient rehab  Met with patient and her husband and two daughters at bedside. Patient able to nod yes and  no to answer questions. A majority of the history was provided by family. Family reports they are grateful for G-tube so pt can continue to receive nutrition while in inpatient rehab. Family reports prior to ICH/SDH pt's appetite had been decreasing over time. She would attempt to eat 3 meals per day, but would share meals with her husband and eat very little at meals. Pt is edentulous. She has upper and lower dentures available (not currently being worn). Family denies any food allergies or intolerances. They report pt is tolerating continuous tube feeds well. Discussed plan to increase rate to account for tube feeds being held for therapy. Will also add Juven BID to help support wound healing.  Family reports pt had been losing weight for about 6 months prior to admission for ICH/SDH. They are unsure of exact UBW. Per review of chart pt was 57.9 kg on 07/10/22, 58.9 kg on 07/11/22, 59.1 kg on 07/13/22, 53.3 kg on 07/17/22, 57.1 kg on 07/20/22 during recent admission. Suspect current wt of 93.3 kg on 08/04/22 is falsely elevated. Recommend re-checking weight and continuing to monitor weight trends this admission.  Enteral Access: G-tube placed 08/01/22  Tube feeds: Glucerna 1.5 at 45 mL/hour  + free water flush 200 mL every 6 hours  Medications reviewed and include: vitamin C 500 mg daily, famotidine, ferrous sulfate 300 mg daily, Novolog 0-15 units every 4 hours, zinc sulfate 220 mg daily  Labs reviewed: CBG 121-161, Sodium 134  UOP: 1825 mL (0.8 mL/kg/hr)  I/O: -2297.3 mL since admission  Discussed with LPN.  NUTRITION - FOCUSED PHYSICAL EXAM:  Flowsheet Row  Most Recent Value  Orbital Region Moderate depletion  Upper Arm Region Moderate depletion  Thoracic and Lumbar Region Moderate depletion  Buccal Region Moderate depletion  Temple Region Moderate depletion  Clavicle Bone Region Moderate depletion  Clavicle and Acromion Bone Region Severe depletion  Scapular Bone Region Unable to assess   Dorsal Hand Severe depletion  Patellar Region Moderate depletion  Anterior Thigh Region Moderate depletion  Posterior Calf Region Severe depletion  Edema (RD Assessment) Mild  [left upper extremity]  Hair Reviewed  Eyes Reviewed  Mouth Reviewed  [edentulous]  Skin Reviewed  Nails Reviewed       Diet Order:   Diet Order             Diet NPO time specified  Diet effective now                  EDUCATION NEEDS:   Not appropriate for education at this time  Skin:  Skin Assessment: Skin Integrity Issues: Skin Integrity Issues:: Stage II, Incisions Stage II: sacrum Incisions: closed incision to head  Last BM:  08/04/22 - large type 7  Height:   Ht Readings from Last 1 Encounters:  08/02/22 5\' 3"  (1.6 m)   Weight:   Wt Readings from Last 1 Encounters:  08/04/22 93.3 kg   Ideal Body Weight:  52.3 kg  BMI:  Body mass index is 36.44 kg/m.  Estimated Nutritional Needs:   Kcal:  1500-1700  Protein:  75-90 grams  Fluid:  > 1.5 L/day  Loanne Drilling, MS, RD, LDN, CNSC Pager number available on Amion

## 2022-08-04 NOTE — Anesthesia Postprocedure Evaluation (Signed)
Anesthesia Post Note  Patient: Gina Kelley  Procedure(s) Performed: ESOPHAGOGASTRODUODENOSCOPY (EGD) WITH PROPOFOL PERCUTANEOUS ENDOSCOPIC GASTROSTOMY (PEG) PLACEMENT     Patient location during evaluation: PACU Anesthesia Type: MAC Level of consciousness: patient cooperative and awake Pain management: pain level controlled Vital Signs Assessment: post-procedure vital signs reviewed and stable Respiratory status: spontaneous breathing, nonlabored ventilation, respiratory function stable and patient connected to nasal cannula oxygen Cardiovascular status: stable and blood pressure returned to baseline Postop Assessment: no apparent nausea or vomiting Anesthetic complications: no   No notable events documented.  Last Vitals:  Vitals:   08/02/22 1114 08/02/22 1117  BP: (!) 145/74 (!) 145/74  Pulse:  89  Resp:  (!) 21  Temp:  37.1 C  SpO2:  96%    Last Pain:  Vitals:   08/02/22 1159  TempSrc:   PainSc: 0-No pain                 Majestic Brister

## 2022-08-04 NOTE — Progress Notes (Signed)
PMR Admission Coordinator Pre-Admission Assessment   Patient: Gina Kelley is an 87 y.o., female MRN: [id number] DOB: Apr 14, 1936 Height: 5\' 3"  (160 cm) Weight: 97.3 kg   Insurance Information HMO: yes    PPO:      PCP:      IPA:      80/20:      OTHER:  PRIMARY: [employer]      Policy#: [id number]      Subscriber: pt CM Name: Gina Kelley      Phone#: [phone] ext [phone]     Fax#: [phone] Pre-Cert#: [id number] [place] for CIR from [place] with [place] with updates due to Hilbert Odor at fax listed above on 2/8 [phone])      Employer:  Benefits:  Phone #: [phone]     Name:  Eff. Date: 06/30/22     Deduct: $0      Out of Pocket Max: $3600 (met $721.48)      Life Max: n/a CIR: $295/day for days 1-7      [name]: $20/day for 20 days, then $203/day Outpatient:      Co-Pay: $25/visit Home Health: 100%      Co-Pay:  DME: 80%     Co-Ins: 20% Providers:  SECONDARY:       Policy#:      Phone#:    Development worker, community:       Phone#:    The Therapist, art Information Summary" for patients in Inpatient Rehabilitation Facilities with attached "Privacy Act [place] Records" was provided and verbally reviewed with: Patient and Family   Emergency Contact Information Contact Information       Name Relation Home Work Mobile    Gina Kelley Daughter Gina Kelley Son     [phone]    Gina Kelley [phone]   [phone]           Current Medical History  Patient Admitting Diagnosis: R IPH/R SDH s/p craniotomy  History of Present Illness: Pt is a 87 y/o female with PMH of HTN, DM, CVA with residual LLE numbness, and osteopenia admitted to [facility] on 1/9 with stroke like symptoms.  EMS note SBP 270 with R gaze preference and L hemiparesis.  ED course labs and vitals unremarkable.  Imaging revealed right sided intraparenchymal hemorrhage with MLS.  Neurology consulted and started on Cleviprex for BP control.  Decision was  made to intubate given liklihood of worsening.  Neurosurgery was consulted.  Concern for irregularity around hemorrhage and significant R frontal edema worrisome for possible intrinsic brain tumor.  Pt underwent R craniotomy and evacuation with biopsy of suspicious tissue on 1/9 per Dr. Annette Stable.  Pathology negative for malignant process.  Pt failed extubation on 2 attempts and underwent compassionate 1-way extubation on 1/23.  She was able to maintain her airway.  Therapy evaluations completed and pt was recommended for CIR due to functional decline.    Complete NIHSS TOTAL: 23   Patient's medical record from [facility] has been reviewed by the rehabilitation admission coordinator and physician.   Past Medical History      Past Medical History:  Diagnosis Date   Anemia of chronic disease 07/08/2006   Antral ulcer 02/23/2007    Seen on EGD in 2008, small ulcer with erosion    Cerebral vascular accident ([place]) 04/14/2006    1998, left lower extremity numbness, no residual deficits    Essential hypertension 04/14/2006   Gastroesophageal reflux disease 02/18/2013  Occasional, symptomatically relieved with peptobismol    Hyperlipidemia LDL goal < 100 04/14/2006   Hypertensive retinopathy of both eyes, grade 1 05/15/2016   Osteopenia of right femoral neck 10/20/2016    DEXA (10/16/2016): R femur T -2.5 (FRAX tool calculates at -2.4), L1-L4 spine T -0.9, 10 year risk for: Major osteoporotic fracture 8.3%, Hip fracture 2.7%   Type 2 diabetes mellitus with stage 1 chronic kidney disease, without long-term current use of insulin (HCC)     Type II diabetes mellitus (HCC) 04/14/2006      Has the patient had major surgery during 100 days prior to admission? Yes   Family History   family history includes Anuerysm (age of onset: 62) in her father; Asthma in her brother and sister; Breast cancer in her sister; Cirrhosis in her brother; Diabetes in her mother; Hypertension in her mother and son; Pulmonary  disease in her brother; Stroke in her mother and sister.   Current Medications   Current Facility-Administered Medications:    acetaminophen (TYLENOL) tablet 650 mg, 650 mg, Per Tube, Q4H PRN, 650 mg at 07/30/22 0654 **OR** acetaminophen (TYLENOL) suppository 650 mg, 650 mg, Rectal, Q4H PRN, Hunsucker, Gina Sago, MD   amLODipine (NORVASC) tablet 10 mg, 10 mg, Per Tube, Daily, Hunsucker, Gina Sago, MD, 10 mg at 08/01/22 1247   atorvastatin (LIPITOR) tablet 80 mg, 80 mg, Per Tube, Daily, Hunsucker, Gina Sago, MD, 80 mg at 08/01/22 1246   bisacodyl (DULCOLAX) suppository 10 mg, 10 mg, Rectal, Daily PRN, Bergman, Gina D, NP   diphenhydrAMINE (BENADRYL) 12.5 MG/5ML elixir 12.5 mg, 12.5 mg, Per Tube, Q8H PRN, Bergman, Gina D, NP   diphenhydrAMINE (BENADRYL) injection 25 mg, 25 mg, Intravenous, Once, Bergman, Gina D, NP   ezetimibe (ZETIA) tablet 10 mg, 10 mg, Per Tube, Daily, Hunsucker, Gina Sago, MD, 10 mg at 08/01/22 1247   feeding supplement (GLUCERNA 1.5 CAL) liquid 1,000 mL, 1,000 mL, Per Tube, Continuous, Hunsucker, Gina Sago, MD, Stopped at 07/31/22 1900   free water 200 mL, 200 mL, Per Tube, Q6H, Hunsucker, Gina Sago, MD, 200 mL at 07/31/22 1858   glucagon (human recombinant) (GLUCAGEN) 1 MG injection, , , ,    glycopyrrolate (ROBINUL) injection 0.4 mg, 0.4 mg, Intravenous, Q4H, Hunsucker, Gina Sago, MD, 0.4 mg at 08/01/22 1248   hydrALAZINE (APRESOLINE) injection 10 mg, 10 mg, Intravenous, Q6H PRN, Hunsucker, Gina Sago, MD   insulin aspart (novoLOG) injection 0-15 Units, 0-15 Units, Subcutaneous, Q4H, Hunsucker, Gina Sago, MD, 3 Units at 07/31/22 1700   labetalol (NORMODYNE) injection 10-40 mg, 10-40 mg, Intravenous, Q4H PRN, Hunsucker, Gina Sago, MD   levETIRAcetam (KEPPRA) IVPB 500 mg/100 mL premix, 500 mg, Intravenous, Q12H, Hunsucker, Gina Sago, MD, Last Rate: 400 mL/hr at 08/01/22 0813, 500 mg at 08/01/22 0813   lidocaine (LIDODERM) 5 % 1 patch, 1 patch, Transdermal, Q24H, Bergman,  Gina D, NP, 1 patch at 08/01/22 1250   losartan (COZAAR) tablet 50 mg, 50 mg, Per Tube, Daily, Hunsucker, Gina Sago, MD, 50 mg at 08/01/22 1246   morphine (PF) 2 MG/ML injection 2 mg, 2 mg, Intravenous, Q2H PRN, Hunsucker, Gina Sago, MD   ondansetron (ZOFRAN) tablet 4 mg, 4 mg, Oral, Q4H PRN **OR** ondansetron (ZOFRAN) injection 4 mg, 4 mg, Intravenous, Q4H PRN, Hunsucker, Gina Sago, MD   pantoprazole (PROTONIX) injection 40 mg, 40 mg, Intravenous, QHS, Hunsucker, Gina Sago, MD, 40 mg at 07/31/22 2303   polyethylene glycol (MIRALAX / GLYCOLAX) packet 17 g, 17 g, Per Tube, Daily, Hunsucker,  Bonna Gains, MD, 17 g at 07/31/22 0830   promethazine (PHENERGAN) tablet 12.5-25 mg, 12.5-25 mg, Oral, Q4H PRN, Hunsucker, Bonna Gains, MD   senna-docusate (Senokot-S) tablet 1 tablet, 1 tablet, Per Tube, BID, Hunsucker, Bonna Gains, MD, 1 tablet at 08/01/22 1246   sodium phosphate (FLEET) 7-19 GM/118ML enema 1 enema, 1 enema, Rectal, Daily PRN, Reinaldo Meeker, Gina D, NP   white petrolatum (VASELINE) gel, , Topical, PRN, [name] D, NP, 0.2 Application at 25/95/63 0846   Patients Current Diet:  Diet Order                  Diet NPO time specified  Diet effective now                         Precautions / Restrictions Precautions Precautions: Fall Precaution Comments: coretrack Restrictions Weight Bearing Restrictions: No    Has the patient had 2 or more falls or a fall with injury in the past year? No   Prior Activity Level Community (5-7x/wk): indep, driving, no dme   Prior Functional Level Self Care: Did the patient need help bathing, dressing, using the toilet or eating? Independent   Indoor Mobility: Did the patient need assistance with walking from room to room (with or without device)? Independent   Stairs: Did the patient need assistance with internal or external stairs (with or without device)? Independent   Functional Cognition: Did the patient need help planning regular tasks such  as shopping or remembering to take medications? Independent   Patient Information Are you of Hispanic, Latino/a,or Spanish origin?: A. No, not of Hispanic, Latino/a, or Spanish origin What is your race?: B. Black or African American Do you need or want an interpreter to communicate with a doctor or health care staff?: 0. No   Patient's Response To:  Health Literacy and Transportation Is the patient able to respond to health literacy and transportation needs?: Yes Health Literacy - How often do you need to have someone help you when you read instructions, pamphlets, or other written material from your doctor or pharmacy?: Never In the past 12 months, has lack of transportation kept you from medical appointments or from getting medications?: No In the past 12 months, has lack of transportation kept you from meetings, work, or from getting things needed for daily living?: No   Home Assistive Devices / Equipment Home Equipment: None   Prior Device Use: Indicate devices/aids used by the patient prior to current illness, exacerbation or injury? None of the above   Current Functional Level Cognition   Arousal/Alertness: Awake/alert Overall Cognitive Status: Difficult to assess Difficult to assess due to: Impaired communication Orientation Level: Other (comment) (not answering questions) General Comments: followed all commands; vocalized "yes"  communicating by thumbs up/down;  "okay" sign, Attention: Focused, Sustained Focused Attention: Appears intact Sustained Attention: Impaired Sustained Attention Impairment: Verbal basic, Functional basic    Extremity Assessment (includes Sensation/Coordination)   Upper Extremity Assessment: Defer to OT evaluation RUE Deficits / Details: generalized weakness LUE Deficits / Details: flaccid (will order resting hand splint for night use) LUE: Subluxation noted LUE Sensation: decreased proprioception LUE Coordination: decreased fine motor, decreased  gross motor  Lower Extremity Assessment: Defer to PT evaluation (movign BLE; R more so than L) RLE Deficits / Details: knee pain with AAROM, grossly >3/5, but pain limiting standing RLE Coordination: decreased fine motor LLE Deficits / Details: moves well against gravity4/5 SLR and HS     ADLs  Overall ADL's : Needs assistance/impaired Eating/Feeding: NPO Grooming: Maximal assistance Grooming Details (indicate cue type and reason): washing face Upper Body Bathing: Maximal assistance Upper Body Bathing Details (indicate cue type and reason): assisting with washing her upper body Lower Body Bathing: Total assistance Upper Body Dressing : Total assistance Lower Body Dressing: Total assistance General ADL Comments: dependent     Mobility   Overal bed mobility: Needs Assistance Bed Mobility: Supine to Sit Rolling: Mod assist Sidelying to sit: Mod assist, Max assist, +2 for safety/equipment Supine to sit: Max assist, HOB elevated Sit to supine: Max assist Sit to sidelying: +2 for physical assistance, Mod assist General bed mobility comments: pt initiated assist in prepping for roll L, needing truncal /LE assis to roll, and truncal/LE assist up from L elbow and R hand to sitting EOB.  Pt needing scooting assist.     Transfers   Overall transfer level: Needs assistance Equipment used: 1 person hand held assist Transfers: Sit to/from Stand Sit to Stand: Max assist, +2 safety/equipment (x4 over the course of the session) Bed to/from chair/wheelchair/BSC transfer type:: Squat pivot Stand pivot transfers: Max assist Squat pivot transfers: Max assist, +2 safety/equipment General transfer comment: pt followed commands/cues well to sequence the transfer to stand and pivot to the chair.     Ambulation / Gait / Stairs / Wheelchair Mobility   Ambulation/Gait General Gait Details: unable     Posture / Balance Dynamic Sitting Balance Sitting balance - Comments: moments of minguard assist but  overall needs minimal assist Balance Overall balance assessment: Needs assistance Sitting-balance support: Single extremity supported, Feet supported Sitting balance-Leahy Scale: Poor Sitting balance - Comments: moments of minguard assist but overall needs minimal assist Postural control: Posterior lean Standing balance support: Bilateral upper extremity supported Standing balance-Leahy Scale: Zero Standing balance comment: pt had a difficult time standing up on her feet for a full peri care session     Special needs/care consideration Skin crani incision and Diabetic management yes    Previous Home Environment (from acute therapy documentation) Living Arrangements: Spouse/significant other Available Help at Discharge: Family, Available 24 hours/day Type of Home: House Home Layout: One level Home Access: Stairs to enter [employer] of Steps: 4 Bathroom Shower/Tub: Chiropodist: Standard   Discharge Living Setting Plans for Discharge Living Setting: Patient's home, Lives with (comment) (spouse) Type of Home at Discharge: House Discharge Home Layout: One level Discharge Home Access: Stairs to enter Entrance Stairs-Number of Steps: 4 Discharge Bathroom Shower/Tub: Tub/shower unit Discharge Bathroom Toilet: Standard Discharge Bathroom Accessibility: Yes How Accessible: Accessible via walker Does the patient have any problems obtaining your medications?: No   Social/Family/Support Systems Patient Roles: Spouse Anticipated Caregiver: children (daughters, son) Anticipated Ambulance person Information: Francesca Jewett (dtr) [phone] Ability/Limitations of Caregiver: n/a Caregiver Availability: 24/7 (working to pull together 24/7 physical care) Discharge Plan Discussed with Primary Caregiver: Yes Is Caregiver In Agreement with Plan?: Yes Does Caregiver/Family have Issues with Lodging/Transportation while Pt is in Rehab?: No   Goals Patient/Family  Goal for Rehab: PT/OT/SLP mod assist Expected length of stay: 14-16 days Pt/Family Agrees to Admission and willing to participate: Yes Program Orientation Provided & Reviewed with Pt/Caregiver Including Roles  & Responsibilities: Yes  Barriers to Discharge: Insurance for [name] coverage, Home environment access/layout   Decrease burden of Care through IP rehab admission: n/a   Possible need for [name] placement upon discharge: not anticipated.  Family working on pulling together 24/7 assist.  I have reviewed likely need for  extensive physical assist at [facility].    Patient Condition: I have reviewed medical records from [facility], spoken with CM, and patient, spouse, and daughter. I met with patient at the bedside for inpatient rehabilitation assessment.  Patient will benefit from ongoing PT, OT, and SLP, can actively participate in 3 hours of therapy a day 5 days of the week, and can make measurable gains during the admission.  Patient will also benefit from the coordinated team approach during an Inpatient Acute Rehabilitation admission.  The patient will receive intensive therapy as well as Rehabilitation physician, nursing, social worker, and care management interventions.  Due to bladder management, bowel management, safety, skin/wound care, disease management, medication administration, pain management, and patient education the patient requires 24 hour a day rehabilitation nursing.  The patient is currently max +2 with mobility and basic ADLs.  Discharge setting and therapy post discharge at home with home health is anticipated.  Patient has agreed to participate in the Acute Inpatient Rehabilitation Program and will admit today.   Preadmission Screen Completed By:  Stephania Fragmin, PT, DPT 08/01/2022 1:00 PM ______________________________________________________________________   Discussed status with Dr. Shearon Stalls on 08/01/22  at now  and received approval for admission today.   Admission Coordinator:   Stephania Fragmin, PT, time 1:00 PM /Date 08/01/22     Assessment/Plan: Diagnosis: Does the need for close, 24 hr/day Medical supervision in concert with the patient's rehab needs make it unreasonable for this patient to be served in a less intensive setting? Yes Co-Morbidities requiring supervision/potential complications: SDH s/p craniotomy, AHRF s/p extubation, DM type 2, HTN, anemia, dysphagia s/p PEG, leukocytosis Due to bladder management, bowel management, safety, skin/wound care, disease management, medication administration, pain management, and patient education and family education, does the patient require 24 hr/day rehab nursing? Yes Does the patient require coordinated care of a physician, rehab nurse, PT, OT, and SLP to address physical and functional deficits in the context of the above medical diagnosis(es)? Yes Addressing deficits in the following areas: balance, endurance, locomotion, strength, transferring, bowel/bladder control, bathing, dressing, feeding, grooming, toileting, cognition, speech, language, swallowing, and psychosocial support Can the patient actively participate in an intensive therapy program of at least 3 hrs of therapy 5 days a week? Yes The potential for patient to make measurable gains while on inpatient rehab is good Anticipated functional outcomes upon discharge from inpatient rehab: mod assist PT, mod assist OT, mod assist SLP Estimated rehab length of stay to reach the above functional goals is: 14-16 days Anticipated discharge destination: Home 10. Overall Rehab/Functional Prognosis: good     MD Signature:   Angelina Sheriff, DO

## 2022-08-04 NOTE — Progress Notes (Signed)
Speech Language Pathology Daily Session Note  Patient Details  Name: Gina Kelley MRN: [id number] Date of Birth: [birth year]  Today's Date: 08/04/2022 SLP Individual Time: 1117-1205 SLP Individual Time Calculation (min): 48 min  Short Term Goals: Week 1: SLP Short Term Goal 1 (Week 1): Pt will consume therapeutic trials of POs (water or nectar thick liquids) with max assist to contain boluses orally and initiate swallow. SLP Short Term Goal 2 (Week 1): Pt will locate objects at midline during functional tasks in 50% of opportunities with max assist multimodal cues. SLP Short Term Goal 3 (Week 1): Pt will use overarticulation and increased vocal intensity to achieve intelligibility at the single phoneme level with max assist multimodal cues. SLP Short Term Goal 4 (Week 1): Pt will sustain her attention to functional tasks for ~3 minute intervals with max cues for redirection. SLP Short Term Goal 5 (Week 1): Pt will communicate basic, immediate wants and needs via multiple modalities with max assist multimodal cues.  Skilled Therapeutic Interventions: Skilled treatment session focused on dysphagia and communication goals. Upon arrival, patient was awake while upright in the tilt-in-space wheelchair with her daughter present. SLP provided thorough oral care via the suction toothbrush. Patient's overall oral cavity still remain edematous with patient's daughter reporting that current oral edema may be related to current oral care products. Therefore, SLP provided signs regarding only using the oral care kit containing hydrogen peroxide and Vaseline for oral care. Nursing made aware. Throughout session, patient utilized an "okay" hand gesture for yes. Patient attempted to utilize other gestures as well to communicate a variety of needs within minimal success. SLP offered other forms of communication,such as writing, a communication board or cued patient to utilize other gesture without success.  Patient's visual perceptual deficits as well as her cognitive deficits are impacting her ability to utilize multimodal communication effectively at this time. Patient was able to verbalize personal information at the word level with 75% intelligibility. Patient also attempted to verbally communicate wants/needs at the phrase level with 20% intelligibility. Patient left upright in tilt-in-space wheelchair with all needs within reach. Continue with current plan of care.      Pain No/Denies Pain   Therapy/Group: Individual Therapy  Gina Kelley 08/04/2022, 4:08 PM

## 2022-08-04 NOTE — Progress Notes (Addendum)
Hgb drift noted:     Latest Ref Rng & Units 08/04/2022    6:49 AM 08/02/2022    2:56 PM 08/01/2022    4:35 PM  CBC  WBC 4.0 - 10.5 K/uL 10.2  12.3  14.6   Hemoglobin 12.0 - 15.0 g/dL 6.8  7.2  8.1   Hematocrit 36.0 - 46.0 % 21.4  21.7  25.1   Platelets 150 - 400 K/uL 384  402  495     Patient without signs or symptoms of ABL. Stool yesterday liquid and brown.  Receiving Pepcid 20 mg BID. BUN normal. She's been averaging 7-8 g/dL since acute admission. Repeat CBC in am and continue to observe and monitor.  Also...will start Mentax, Vitamin C and zinc for sacral wound healing.

## 2022-08-04 NOTE — Discharge Summary (Signed)
Physician Discharge Summary  Patient ID: Gina Kelley MRN: [id number] DOB/AGE: [birth year] 85 y.o.  Admit date: 08/02/2022 Discharge date: 08/22/2022  Discharge Diagnoses:  Principal Problem:   Subdural hematoma ([place]) Active Problems:   Mild malnutrition ([facility]) Functional deficits secondary to subdural hematoma/ICH Hypertension Hyperlipidemia Leukocytosis Anemia Angioedema due to chlorhexidine skin wipes Coccygeal wound Dysphagia s/p PEG Moderate protein malnutrition Diarrhea   Discharged Condition: stable  Significant Diagnostic Studies:  Narrative & Impression  CLINICAL DATA:  Constipation, vomiting   EXAM: ABDOMEN - 1 VIEW   COMPARISON:  Radiograph 07/25/2022   FINDINGS: Gastrostomy tube overlies the left hemiabdomen. Contrast material in the stomach, distal small bowel, and proximal colon from recent modified barium swallow study. No evidence of bowel obstruction. Moderate stool burden. Degenerative changes of the spine and hips.   IMPRESSION: No evidence of bowel obstruction.  Moderate stool burden.   Gastrostomy tube overlies the stomach.     Electronically Signed   By: Maurine Simmering M.D.   On: 08/20/2022 11:12       Narrative & Impression  Modified Barium Swallow Study  Patient Details  Name: Gina Kelley MRN: [id number] Date of Birth: 03/30/1936   Today's Date: 08/20/2022   HPI/PMH: HPI: Pt is an 87 year old female who presented to the ED on 1/9 as a Code Stroke after being found unconscious, nonverbal, with left hemiplegia. CT Head demonstrated right IPH with 69m midline shift and right frontoparietal SDH. Taken to OR 1/9 for R craniotomy/SDH evacuation. Postoperative repeat CT Head 1/10 demonstrated resolution of SDH post-crani and some residual hemorrhage without evidence of mass effect.  CT head 1/12 with Similar size of residual intraparenchymal hemorrhage centered at the right frontal operculum, but with mildly increased localized edema.  Mass effect with approximately 5 mm of right-to-left shift, mildly increased from prior. Increased prominence of small focal hypodensity involving the right basal ganglia, suspicious for an evolving ischemic infarct. Abnormal vasogenic edema involving the anterior right frontal lobe, concerning for underlying tumor/mass.  ETT 1/9-1/15; 1/15-18; 1/18-123 (1-way extubation). Palliative care consulted to assist with [name] in the setting of multiple failed extubations. Cortrak placed 1/15. PMH: HTN, HLD, CVA (1998 with residual LLE numbness), T2DM, osteopenia.     Clinical Impression: Clinical Impression: MBSS completed to assess readiness for diet initiation in the setting of subacute SDH, prolonged NPO status, and deconditioning. Pt presents with moderate-severe oral and mild pharyngeal dysphagia. From an oral standpoint, pt exhibits decreased bolus cohesion + manipulation, incomplete A-P transit resulting in oral residuals, and swallow initiation at various points (more viscous textures at the level of the valleculae and over epiglottis, with less viscous at the level of the pyriform sinuses). Pharyngeally, laryngeal elevation is minimal (suspect due to anatomy) with some decreased BOT approximation to PPW, and decreased laryngeal vestibule closure with thin liquids only.    Aforementioned deficits resulted in sensed penetration with resultant aspiration (posterior) of thin liquid consistency via tsp on both trials; therefore, did not progress to cup. No penetration nor aspiration observed with intake of mildly thick/nectar-thick via tsp, moderately-thick/honey-thick via tsp, nor pureed consistencies. Of note, attempted to provide liquids via cup, though difficult for pt to manipulate due to motoric and orofacial limitations. Minimal to no pharyngeal residuals were noted post-swallow. Compensatory strategies of straw and double swallow to clear oral residuals were not effective.    Given instrumental findings,  pt's increased risk factors outlined below, and clinical judgment of pt's medical status/therapy performance, recommend initiation of more aggressive PO  trials, with SLP only, of pureed and moderately-thick/honey-thick liquid via tsp at this time. May progress to mildly-thick/nectar-thick at bedside, if no overt s/sx concerning for airway intrusion are noted. If pt tolerates more aggressive trials with SLP, recommend initiation of PO diet of these consistencies. Pt, staff, and family education will need to be completed re: MBSS findings, recommendations, and safe swallowing strategies. Pt in agreement with proposed plan. Please see imaging for full report. Thank you for allowing [place] to care for Gina Kelley.   Factors that may increase risk of adverse event in presence of aspiration ([place] 2021): Factors that may increase risk of adverse event in presence of aspiration ([place] 2021): Reduced cognitive function; Limited mobility; Frail or deconditioned; Dependence for feeding and/or oral hygiene; Weak cough     Recommendations/Plan: Swallowing Evaluation Recommendations Swallowing Evaluation Recommendations Recommendations: NPO; Alternative means of nutrition - G Tube (Will complete trials of puree and honey-thick liquids in therapy and progress to PO diet as able) PO Diet Recommendation: Dysphagia 1 (Pureed); Moderately thick liquids (Level 3, honey thick) (when ready) Liquid Administration via: Spoon Medication Administration: Via alternative means Supervision: Full assist for feeding Swallowing strategies  : Minimize environmental distractions; Slow rate; Small bites/sips; Check for pocketing or oral holding; Check for anterior loss Postural changes: Position pt fully upright for meals; Stay upright 30-60 min after meals; Out of bed for meals Oral care recommendations: Oral care QID (4x/day)       Treatment Plan Treatment Plan Treatment recommendations: Therapy as outlined  in treatment plan below Follow-up recommendations: Skilled nursing-short term rehab (<3 hours/day) Interventions: Aspiration precaution training; Compensatory techniques; Patient/family education; Trials of upgraded texture/liquids; Diet toleration management by SLP         Recommendations Recommendations for follow up therapy are one component of a multi-disciplinary discharge planning process, led by the attending physician.  Recommendations may be updated based on patient status, additional functional criteria and insurance authorization.   Assessment: Orofacial Exam: Orofacial Exam Oral Cavity - Dentition: Edentulous       Anatomy:  Anatomy: WFL     Thin Liquids: Thin Liquids (Level 0) Thin Liquids : Impaired Bolus delivery method: Spoon Thin Liquid - Impairment: Oral Impairment; Pharyngeal impairment Lip Closure: Escape beyond mid-chin Tongue control during bolus hold: Posterior escape of greater than half of bolus Bolus transport/lingual motion: Repetitive/disorganized tongue motion Oral residue: Residue collection on oral structures Location of oral residue : Floor of mouth; Tongue; Palate; Lateral sulci Initiation of swallow : Pyriform sinuses Soft palate elevation: No bolus between soft palate (SP)/pharyngeal wall (PW) Laryngeal elevation: Minimal superior movement of thyroid cartilage with minimal approximation of arytenoids to epiglottic petiole Anterior hyoid excursion: Complete Epiglottic movement: Complete Laryngeal vestibule closure: Incomplete, narrow column air/contrast in laryngeal vestibule Pharyngeal contraction (A/P view only): N/A Pharyngoesophageal segment opening: Complete distension and complete duration, no obstruction of flow Tongue base retraction: Wide column of contrast or air between tongue base and PPW Pharyngeal residue: Complete pharyngeal clearance Location of pharyngeal residue: N/A Penetration/Aspiration Scale (PAS) score: 7.  Material  enters airway, passes BELOW cords and not ejected out despite cough attempt by patient       Mildly Thick Liquids: Mildly thick liquids (Level 2, nectar thick) Mildly thick liquids (Level 2, nectar thick): Impaired Bolus delivery method: Spoon (attempted cup with minimal success due to oral deficits) Mildly Thick Liquid - Impairment: Oral Impairment; Pharyngeal impairment Lip Closure: Escape beyond mid-chin Tongue control during bolus hold: Posterior escape  of greater than half of bolus Bolus transport/lingual motion: Repetitive/disorganized tongue motion Oral residue: Residue collection on oral structures Location of oral residue : Floor of mouth; Tongue; Palate; Lateral sulci Initiation of swallow : Valleculae Soft palate elevation: No bolus between soft palate (SP)/pharyngeal wall (PW) Laryngeal elevation: Minimal superior movement of thyroid cartilage with minimal approximation of arytenoids to epiglottic petiole Anterior hyoid excursion: Complete Epiglottic movement: Complete Laryngeal vestibule closure: Complete, no air/contrast in laryngeal vestibule Pharyngeal stripping wave : Present - complete Pharyngeal contraction (A/P view only): N/A Pharyngoesophageal segment opening: Complete distension and complete duration, no obstruction of flow Tongue base retraction: Narrow column of contrast or air between tongue base and PPW Pharyngeal residue: Complete pharyngeal clearance Penetration/Aspiration Scale (PAS) score: 1.  Material does not enter airway       Moderately Thick Liquids: Moderately thick liquids (Level 3, honey thick) Moderately thick liquids (Level 3, honey thick): Impaired Bolus delivery method: Spoon Moderately Thick Liquid - Impairment: Oral Impairment; Pharyngeal impairment Lip Closure: Escape progressing to mid-chin Tongue control during bolus hold: Posterior escape of less than half of bolus Bolus transport/lingual motion: Repetitive/disorganized tongue  motion Oral residue: Residue collection on oral structures Location of oral residue : Floor of mouth; Tongue; Palate; Lateral sulci Initiation of swallow : Posterior laryngeal surface of the epiglottis Soft palate elevation: No bolus between soft palate (SP)/pharyngeal wall (PW) Laryngeal elevation: Minimal superior movement of thyroid cartilage with minimal approximation of arytenoids to epiglottic petiole Anterior hyoid excursion: Complete Epiglottic movement: Complete Laryngeal vestibule closure: Complete, no air/contrast in laryngeal vestibule Pharyngeal stripping wave : Present - complete Pharyngeal contraction (A/P view only): N/A Pharyngoesophageal segment opening: Complete distension and complete duration, no obstruction of flow Tongue base retraction: Narrow column of contrast or air between tongue base and PPW Pharyngeal residue: Trace residue within or on pharyngeal structures Location of pharyngeal residue: Valleculae Penetration/Aspiration Scale (PAS) score: 1.  Material does not enter airway       Puree: Puree Puree: Impaired Puree - Impairment: Oral Impairment; Pharyngeal impairment Lip Closure: Escape from interlabial space or lateral juncture, no extension beyond vermillion border Bolus transport/lingual motion: Repetitive/disorganized tongue motion Oral residue: Residue collection on oral structures Location of oral residue : Tongue; Palate; Floor of mouth; Lateral sulci Initiation of swallow: Valleculae Soft palate elevation: No bolus between soft palate (SP)/pharyngeal wall (PW) Laryngeal elevation: Minimal superior movement of thyroid cartilage with minimal approximation of arytenoids to epiglottic petiole Anterior hyoid excursion: Complete Epiglottic movement: Complete Laryngeal vestibule closure: Complete, no air/contrast in laryngeal vestibule Pharyngeal stripping wave : Present - complete Pharyngeal contraction (A/P view only): N/A Pharyngoesophageal  segment opening: Complete distension and complete duration, no obstruction of flow Tongue base retraction: Trace column of contrast or air between tongue base and PPW Pharyngeal residue: Complete pharyngeal clearance Location of pharyngeal residue: N/A Penetration/Aspiration Scale (PAS) score: 1.  Material does not enter airway       Solid: Solid Solid: Not Tested       Pill: Pill Pill: Not Tested       Compensatory Strategies: Compensatory Strategies Compensatory strategies: Yes Straw: Ineffective Ineffective Straw: Mildly thick liquid (Level 2, nectar thick) Multiple swallows: Ineffective Ineffective Multiple Swallows: Mildly thick liquid (Level 2, nectar thick); Moderately thick liquid (Level 3, honey thick)          General Information: Caregiver present: No   Diet Prior to this Study: G-tube; NPO    Temperature : Normal    Respiratory Status: [facility]  Supplemental O2: None (Room air)    History of Recent Intubation: Yes   Behavior/Cognition: Alert; Cooperative   Self-Feeding Abilities: Dependent for feeding   Baseline vocal quality/speech: Dysphonic   Volitional Cough: Able to elicit   Volitional Swallow: Able to elicit   Exam Limitations: No limitations     Goal Planning: Prognosis for improved oropharyngeal function: Fair   Barriers to Reach Goals: Time post onset; Overall medical prognosis; Cognitive deficits   No data recorded Patient/Family Stated Goal: none stated   Consulted and agree with results and recommendations: Patient; Physician; Nurse     Pain: Pain Assessment Pain Assessment: 0-10 Pain Score: 0 Faces Pain Scale: 4 Breathing: 0 Negative Vocalization: 0 Facial Expression: 0 Body Language: 0 Consolability: 0 PAINAD Score: 0 Facial Expression: 0 Body Movements: 0 Muscle Tension: 0 Compliance with ventilator (intubated pts.): 0 Vocalization (extubated pts.): N/A CPOT Total: 0 Pain Location: R facial swelling and neck;  R knee Pain Descriptors / Indicators: Grimacing Pain Intervention(s): Monitored during session       End of Session: Start Time:SLP Start Time (ACUTE ONLY): 0934   Stop Time: SLP Stop Time (ACUTE ONLY): 0944   Time Calculation:SLP Time Calculation (min) (ACUTE ONLY): 10 min   Charges: SLP Evaluations $ SLP Speech Visit: 1 Visit   SLP Evaluations $BSS Swallow: 1 Procedure $ SLP EVAL LANGUAGE/SOUND PRODUCTION: 1 Procedure $Swallowing Treatment: 1 Procedure $Speech Treatment for Individual: 1 Procedure     SLP visit diagnosis: SLP Visit Diagnosis: Dysphagia, oropharyngeal phase (R13.12); Cognitive communication deficit (R41.841); Dysarthria and anarthria (R47.1)        Narrative & Impression  CLINICAL DATA:  Bilateral rales.   EXAM: CHEST - 2 VIEW   COMPARISON:  Chest radiographs 08/05/2022, on [id number], 07/13/2022 03/20/2023   FINDINGS: Cardiac silhouette and mediastinal contours are within normal limits. Mild to moderate calcification within the aortic arch. Mildly decreased lung volumes, although improved from most recent 08/05/2022 radiograph. Right basilar linear subsegmental atelectasis is similar to 08/05/2022. No definitive focal airspace opacity to indicate pneumonia. No pleural effusion or pneumothorax. Moderate multilevel degenerative disc changes of the thoracic spine.   IMPRESSION: 1. Mildly decreased lung volumes, although improved from most recent 08/05/2022 radiograph. 2. Right basilar subsegmental atelectasis. No definite focal airspace opacity to indicate pneumonia.     Electronically Signed   By: Yvonne Kendall M.D.   On: 08/18/2022 09:55      Narrative & Impression  CLINICAL DATA:  Aspiration   EXAM: PORTABLE CHEST 1 VIEW   COMPARISON:  07/14/2022   FINDINGS: Subsegmental atelectasis identified right base. Right apex is obscured by overlapping structures. Left lung grossly clear. Calcified aorta. Thoracic degenerative changes.  Unremarkable cardiac silhouette.   IMPRESSION: Right base subsegmental atelectasis.  Study limited by positioning.     Electronically Signed   By: Sammie Bench M.D.   On: 08/05/2022 15:12     Labs:  Basic Metabolic Panel: Recent Labs  Lab 08/18/22 0555  NA 140  K 3.7  CL 100  CO2 30  GLUCOSE 165*  BUN 37*  CREATININE 0.58  CALCIUM 8.5*    CBC: Recent Labs  Lab 08/18/22 0555  WBC 6.4  HGB 8.4*  HCT 27.3*  MCV 92.9  PLT 284    CBG: Recent Labs  Lab 08/21/22 2016 08/22/22 0001 08/22/22 0451 08/22/22 0811 08/22/22 1140  GLUCAP 121* 157* 98 169* 140*    Brief HPI:   Gina Kelley is a 87 y.o. female  who  presented to the emergency department on 07/08/2022 due to strokelike symptoms.  Her family noticed confusion, difficulty with speech, facial droop and left-sided weakness.  They called EMS and she was noted to have an initial systolic blood pressure of 270.  CT of the head showed large acute intraparenchymal hematoma with associated subdural hematoma.  There was mass effect with 12 mm of midline shift.  Trauma history was elicited.  Neurology and neurosurgery were consulted.  She was started on Cleviprex infusion and Decadron 10 mg x 1 dose.  She was intubated for airway control.  Nasogastric tube was placed.  She was taken to the operating room where she underwent right frontotemporoparietal craniotomy and evacuation of intracerebral hemorrhage and right convexity subdural hematoma by Dr. Trenton Gammon.  Biopsy of suspicious tissue was obtained.  It was negative for malignant process.  Postoperative CT of the head on 1/10 demonstrated resolution of SDH postcraniotomy and some residual hemorrhage without evidence of mass effect.  She was started on hypertonic saline on 1/13.  She was extubated on 1/15.  Later that day her blood gases were significant for hypercapnia with respiratory rate of 32 she was reintubated.  Her left side remains plegic.  She again failed extubation  on 1/18 and required reintubation.  Foley to care consultation was obtained on 1/19.  Family decided on DNR CODE STATUS.  On 1/23 she was tolerating pressure support and was extubated. On EGD with percutaneous endoscopic gastrostomy tube placement on 2/2 by Dr. Bobbye Morton.    Hospital Course: DEAVEN SWANSEN was admitted to rehab 08/02/2022 for inpatient therapies to consist of PT, ST and OT at least three hours five days a week. Past admission physiatrist, therapy team and rehab RN have worked together to provide customized collaborative inpatient rehab. Discontinued mouthwash and chlorhexidine skin wipes due to angioedema. Foley continued due to bed immobility and coccygeal wound. Normalization in WBC to 10.2 on 2/05 and drop in Hgb to 6.8. No signs or symptoms of acute blood loss. Fe/anemia profile normal on 1/13. Recheck hemoglobin on 2/06 up 7.4. E stim and taping to L shoulder for mild sublux per OT 2/06. Decrease in hemoglobin to 6.4 on 2/09 and given one unit of PRBCs. Venous duplex of LUE on 2/08 with SVT of cephalic vein. Warm compresses and conservative treatment given.  FOBT was positive on 2/10 and H and H rechecked and stable. Discussed with on-call GI. Continue to monitor. Dysarthria much improved as able to answer ~80% of questions on 2/12. Timed voids started every four hours on 2/12.  Sitting balance improving, however core strength and head positioning remained very challenging. Potassium supplementation 40 mEq daily on 2/12.  Her gastrostomy tube has no jejunal component therefore switched to bolus feedings. Finished course of Keppra for seizure prophy. Tylenol and gabapentin 100 mg TID scheduled for shoulder and knee pain. Started fluoxetine 20 mg q HS for depressed mood and motor recovery on 2/20. MBS on 2/21 and started PO trials. Metformin 500 mg q AM for elevated blood sugars.   Blood pressures were monitored on TID basis and remained stable on amlodipine 10 mg daily.  Her losartan was  discontinued on admission to CIR due to angioedema. PRN hydralazine ordered. HCTZ 6.325 mg added on 2/07 for mildly elevated BP and increased to 12.5 mg BID with Kcl 20 mEq on 2/09. HCTZ discontinued and started on Coreg 3.125 mg BID on 2/09. Coreg increased to 6.5 mg BID  on 2/13.   Rehab course: During  patient's stay in rehab weekly team conferences were held to monitor patient's progress, set goals and discuss barriers to discharge. At admission, patient required total assist with mobility  She has had improvement in activity tolerance, balance, postural control as well as ability to compensate for deficits. She has had improvement in functional use RUE/LUE  and RLE/LLE as well as improvement in awareness. Able to stand X2 with PT for 5 minutes. She was Max A for most mobility.   Discharge disposition: 03-Skilled Nursing Facility     Diet: NPO  Special Instructions: No driving, alcohol consumption or tobacco use.  30-35 minutes were spent on discharge planning and discharge summary.  Discharge Instructions     Discharge patient   Complete by: As directed    Discharge disposition: 03-Skilled Nursing Facility   Discharge patient date: 08/22/2022      Allergies as of 08/22/2022       Reactions   Enalapril Cough   Losartan Other (See Comments)   angioedema   Chlorhexidine Swelling   Possible irritation from CHG, eye tongue and lip swelling        Medication List     STOP taking these medications    acetaminophen 650 MG suppository Commonly known as: TYLENOL Replaced by: acetaminophen 160 MG/5ML solution   aspirin 81 MG chewable tablet   bisacodyl 10 MG suppository Commonly known as: DULCOLAX   diphenhydrAMINE 12.5 MG/5ML elixir Commonly known as: BENADRYL   feeding supplement (GLUCERNA 1.5 CAL) Liqd Replaced by: nutrition supplement (JUVEN) Pack   losartan 25 MG tablet Commonly known as: COZAAR   ondansetron 4 MG tablet Commonly known as: ZOFRAN    pantoprazole 40 MG injection Commonly known as: PROTONIX   PRESERVISION AREDS PO   promethazine 12.5 MG tablet Commonly known as: PHENERGAN   Vitamin D3 75 MCG (3000 UT) Tabs Generic drug: Cholecalciferol   white petrolatum Oint Commonly known as: VASELINE       TAKE these medications    acetaminophen 160 MG/5ML solution Commonly known as: TYLENOL Place 20.3 mLs (650 mg total) into feeding tube every 8 (eight) hours. Replaces: acetaminophen 650 MG suppository   amLODipine 10 MG tablet Commonly known as: NORVASC Place 1 tablet (10 mg total) into feeding tube daily. Start taking on: August 23, 2022 What changed:  medication strength how much to take how to take this   ascorbic acid 500 MG tablet Commonly known as: VITAMIN C Place 1 tablet (500 mg total) into feeding tube daily. Start taking on: August 23, 2022   atorvastatin 80 MG tablet Commonly known as: LIPITOR Place 1 tablet (80 mg total) into feeding tube daily. Start taking on: August 23, 2022 What changed: how to take this   carvedilol 6.25 MG tablet Commonly known as: COREG Place 1 tablet (6.25 mg total) into feeding tube 2 (two) times daily with a meal.   diclofenac Sodium 1 % Gel Commonly known as: VOLTAREN Apply 2 g topically 4 (four) times daily.   ezetimibe 10 MG tablet Commonly known as: ZETIA Place 1 tablet (10 mg total) into feeding tube daily. Start taking on: August 23, 2022 What changed: how to take this   famotidine 20 MG tablet Commonly known as: PEPCID Place 1 tablet (20 mg total) into feeding tube 2 (two) times daily.   ferrous sulfate 300 (60 Fe) MG/5ML syrup Place 5 mLs (300 mg total) into feeding tube daily with breakfast. Start taking on: August 23, 2022   FLUoxetine 20 MG capsule Commonly  known as: PROZAC Place 1 capsule (20 mg total) into feeding tube at bedtime.   free water Soln Place 120 mLs into feeding tube 5 (five) times daily. What changed:  how  much to take when to take this   gabapentin 250 MG/5ML solution Commonly known as: NEURONTIN Place 2 mLs (100 mg total) into feeding tube every 8 (eight) hours.   insulin aspart 100 UNIT/ML injection Commonly known as: novoLOG Inject 0-15 Units into the skin 3 (three) times daily with meals. What changed: when to take this   insulin aspart 100 UNIT/ML injection Commonly known as: novoLOG Inject 0-5 Units into the skin at bedtime. What changed: You were already taking a medication with the same name, and this prescription was added. Make sure you understand how and when to take each.   l-methylfolate-B6-B12 3-35-2 MG Tabs tablet Commonly known as: METANX Place 1 tablet into feeding tube daily. Start taking on: August 23, 2022   lidocaine 5 % Commonly known as: LIDODERM Place 1 patch onto the skin daily. Remove & Discard patch within 12 hours or as directed by MD Start taking on: August 23, 2022   metFORMIN 500 MG tablet Commonly known as: GLUCOPHAGE Place 1 tablet (500 mg total) into feeding tube 2 (two) times daily with a meal.   nutrition supplement (JUVEN) Pack Place 1 packet into feeding tube 2 (two) times daily between meals. Replaces: feeding supplement (GLUCERNA 1.5 CAL) Liqd   feeding supplement (GLUCERNA 1.5 CAL) Liqd Place 237 mLs into feeding tube 5 (five) times daily.   nystatin 100000 UNIT/ML suspension Commonly known as: MYCOSTATIN Use as directed 5 mLs (500,000 Units total) in the mouth or throat 4 (four) times daily.   polyethylene glycol 17 g packet Commonly known as: MIRALAX / GLYCOLAX Place 17 g into feeding tube daily as needed for mild constipation. What changed:  when to take this reasons to take this   polyvinyl alcohol 1.4 % ophthalmic solution Commonly known as: LIQUIFILM TEARS Place 1 drop into both eyes as needed for dry eyes.        Contact information for follow-up providers     Sid Falcon, MD Follow up.   Specialty:  Internal Medicine Why: Call the office in 1-2 days to make arrangements for hospital follow-up appointment. Contact information: [place] [address] [phone]         Durel Salts C, DO Follow up.   Specialty: Physical Medicine and Rehabilitation Why: office will call you to arrange your appt (sent) Contact information: [place] [address] [phone]         [facility] Follow up.   Why: Call the office in 1-2 days to make arrangements for hospital follow-up appointment. Contact information: [place] [phone]             Contact information for after-discharge care     Destination     [facility] Preferred [name] .   Service: Skilled Nursing Contact information: [address] [place] [phone]                     Signed: Barbie Banner 08/22/2022, 1:07 PM

## 2022-08-04 NOTE — Care Management (Signed)
[facility] Individual Statement of Services  Patient Name:  Gina Kelley  Date:  08/04/2022  Welcome to the [facility].  Our goal is to provide you with an individualized program based on your diagnosis and situation, designed to meet your specific needs.  With this comprehensive rehabilitation program, you will be expected to participate in at least 3 hours of rehabilitation therapies Monday-Friday, with modified therapy programming on the weekends.  Your rehabilitation program will include the following services:  Physical Therapy (PT), Occupational Therapy (OT), Speech Therapy (ST), 24 hour per day rehabilitation nursing, Therapeutic Recreaction (TR), Psychology, Neuropsychology, Care Coordinator, Rehabilitation Medicine, [place], and Other  Weekly team conferences will be held on Tuesdays to discuss your progress.  Your Inpatient Rehabilitation Care Coordinator will talk with you frequently to get your input and to update you on team discussions.  Team conferences with you and your family in attendance may also be held.  Expected length of stay: 17-21 days    Overall anticipated outcome: Minimal Assistance  Depending on your progress and recovery, your program may change. Your Inpatient Rehabilitation Care Coordinator will coordinate services and will keep you informed of any changes. Your Inpatient Rehabilitation Care Coordinator's name and contact numbers are listed  below.  The following services may also be recommended but are not provided by the [facility] will be made to provide these services after discharge if needed.  Arrangements include referral to agencies that provide these services.  Your insurance has been verified to be:  [employer]  Your  primary doctor is:  Gilles Chiquito  Pertinent information will be shared with your doctor and your insurance company.  Inpatient Rehabilitation Care Coordinator:  Cathleen Corti [phone] or [phone]  Information discussed with and copy given to patient by: Rana Snare, 08/04/2022, 9:08 AM

## 2022-08-05 ENCOUNTER — Inpatient Hospital Stay (HOSPITAL_COMMUNITY): Payer: Medicare HMO

## 2022-08-05 DIAGNOSIS — J9811 Atelectasis: Secondary | ICD-10-CM | POA: Diagnosis not present

## 2022-08-05 LAB — CBC
HCT: 23.6 % — ABNORMAL LOW (ref 36.0–46.0)
Hemoglobin: 7.4 g/dL — ABNORMAL LOW (ref 12.0–15.0)
MCH: 28.2 pg (ref 26.0–34.0)
MCHC: 31.4 g/dL (ref 30.0–36.0)
MCV: 90.1 fL (ref 80.0–100.0)
Platelets: 405 10*3/uL — ABNORMAL HIGH (ref 150–400)
RBC: 2.62 MIL/uL — ABNORMAL LOW (ref 3.87–5.11)
RDW: 16.9 % — ABNORMAL HIGH (ref 11.5–15.5)
WBC: 7.8 10*3/uL (ref 4.0–10.5)
nRBC: 0 % (ref 0.0–0.2)

## 2022-08-05 LAB — GLUCOSE, CAPILLARY
Glucose-Capillary: 135 mg/dL — ABNORMAL HIGH (ref 70–99)
Glucose-Capillary: 138 mg/dL — ABNORMAL HIGH (ref 70–99)
Glucose-Capillary: 139 mg/dL — ABNORMAL HIGH (ref 70–99)
Glucose-Capillary: 150 mg/dL — ABNORMAL HIGH (ref 70–99)
Glucose-Capillary: 158 mg/dL — ABNORMAL HIGH (ref 70–99)
Glucose-Capillary: 167 mg/dL — ABNORMAL HIGH (ref 70–99)

## 2022-08-05 MED ORDER — DICLOFENAC SODIUM 1 % EX GEL
2.0000 g | Freq: Four times a day (QID) | CUTANEOUS | Status: DC
  Administered 2022-08-05 – 2022-08-22 (×60): 2 g via TOPICAL
  Filled 2022-08-05 (×2): qty 100

## 2022-08-05 NOTE — IPOC Note (Signed)
Overall Plan of Care [facility]) Patient Details Name: Gina Kelley MRN: [id number] DOB: 08/04/1935  Admitting Diagnosis: Subdural hematoma [facility])  Hospital Problems: Principal Problem:   Subdural hematoma ([place])     Functional Problem List: Nursing Behavior, Pain, Bladder, Bowel, Safety, Edema, Sensory, Endurance, Skin Integrity, Medication Management, Motor, Nutrition  PT Balance, Pain, Perception, Safety, Endurance, Sensory, Motor, Skin Integrity, Nutrition  OT Balance, Cognition, Endurance, Motor, Pain, Perception, Safety, Sensory, Vision  SLP Cognition, Linguistic, Nutrition  TR         Basic ADL's: OT Grooming, Bathing, Dressing, Toileting     Advanced  ADL's: OT       Transfers: PT Bed Mobility, Bed to Chair, Teacher, early years/pre, Tub/Shower     Locomotion: PT Ambulation, Wheelchair Mobility     Additional Impairments: OT Fuctional Use of Upper Extremity  SLP Swallowing, Communication, Social Cognition expression Attention, Awareness  TR      Anticipated Outcomes Item Anticipated Outcome  Self Feeding    Swallowing  mod assist   Basic self-care  Min A  Toileting  Mod A   Bathroom Transfers Min A  Bowel/Bladder  continent B/B  Transfers  Min A  Locomotion  Min A  Communication  mod assist  Cognition  mod assist  Pain  less than 4  Safety/Judgment  pressure injurie/incision will improve while in rehab   Therapy Plan: PT Intensity: Minimum of 1-2 x/day ,45 to 90 minutes PT Frequency: 5 out of 7 days PT Duration Estimated Length of Stay: 2.5 - 3 weeks OT Intensity: Minimum of 1-2 x/day, 45 to 90 minutes OT Frequency: 5 out of 7 days OT Duration/Estimated Length of Stay: 3 weeks SLP Intensity: Minumum of 1-2 x/day, 30 to 90 minutes SLP Frequency: 3 to 5 out of 7 days SLP Duration/Estimated Length of Stay: 21-24 days   Team Interventions: Nursing Interventions Patient/Family Education, Pain Management, Dysphagia/Aspiration Precaution Training,  Bladder Management, Medication Management, Discharge Planning, Bowel Management, Skin Care/Wound Management, Psychosocial Support, Disease Management/Prevention, Cognitive Remediation/Compensation  PT interventions Ambulation/gait training, Cognitive remediation/compensation, Discharge planning, DME/adaptive equipment instruction, Functional mobility training, Pain management, Psychosocial support, Splinting/orthotics, Therapeutic Activities, UE/LE Strength taining/ROM, Visual/perceptual remediation/compensation, Wheelchair propulsion/positioning, UE/LE Coordination activities, Therapeutic Exercise, Stair training, Skin care/wound management, Patient/family education, Neuromuscular re-education, Functional electrical stimulation, Disease management/prevention, Academic librarian, Training and development officer  OT Interventions Training and development officer, Cognitive remediation/compensation, Discharge planning, Community reintegration, Engineer, drilling, Functional electrical stimulation, Neuromuscular re-education, Functional mobility training, Pain management, Patient/family education, Psychosocial support, Self Care/advanced ADL retraining, Splinting/orthotics, Therapeutic Activities, Therapeutic Exercise, UE/LE Strength taining/ROM, UE/LE Coordination activities, Visual/perceptual remediation/compensation, Wheelchair propulsion/positioning  SLP Interventions Cognitive remediation/compensation, English as a second language teacher, Internal/external aids, Patient/family education, Functional tasks, Environmental controls, Speech/Language facilitation  TR Interventions    SW/CM Interventions Discharge Planning, Psychosocial Support, Patient/Family Education   Barriers to Discharge MD  Medical stability, Home enviroment access/loayout, Incontinence, Wound care, Lack of/limited family support, and Nutritional means  Nursing Decreased caregiver support, Home environment access/layout, Incontinence, Wound  Care, Weight, Medication compliance, Behavior, Nutrition means home with spouse 1 level with 4 ste  PT Decreased caregiver support, Home environment access/layout family working on 24/7 assist  OT Nutrition means Peg tube  SLP      SW Decreased caregiver support, Insurance underwriter for [name] coverage, Lack of/limited family support     Team Discharge Planning: Destination: PT-Home ,OT- Home , SLP-Home Projected Follow-up: PT-Home health PT, OT-  Home health OT, SLP-Home Health SLP, 24 hour supervision/assistance Projected Equipment Needs: PT-To be determined, OT- To  be determined, SLP-To be determined Equipment Details: PT-owns no DME, spouse owns RW, OT-Pt reports: shower chair, grab bars by toilet Patient/family involved in discharge planning: PT- Family member/caregiver, Patient,  OT-Patient, SLP-Patient  MD ELOS: 21-24 days Medical Rehab Prognosis:  Fair Assessment: The patient has been admitted for CIR therapies with the diagnosis of subdural hematoma with intracranial hemorrhage status post right frontotemporoparietal craniotomy . The team will be addressing functional mobility, strength, stamina, balance, safety, adaptive techniques and equipment, self-care, bowel and bladder mgt, patient and caregiver education. Goals have been set at [place]. Anticipated discharge destination is home.       See Team Conference Notes for weekly updates to the plan of care

## 2022-08-05 NOTE — Progress Notes (Signed)
Orthopedic Tech Progress Note Patient Details:  HAWLEY PAVIA 1935-09-16 [id number]  Called in order to [employer] for a REHAB COMBO and a SOFT COLLAR   Patient ID: MEEGAN SHANAFELT, female   DOB: 09/11/1935, 87 y.o.   MRN: [id number]  Janit Pagan 08/05/2022, 12:13 PM

## 2022-08-05 NOTE — Progress Notes (Signed)
Patient ID: Gina Kelley, female   DOB: March 03, 1936, 87 y.o.   MRN: [id number]  SW met with pt dtr Dwyane Luo who is leaving today to return to [place]. Pt dtr Francesca Jewett had to step away at the time. SW provided updates from team conference, and d/c date 2/28. Family continues to work on d.c plan details as it has not been finalized at this time. SW will continue to provide updates.   Loralee Pacas, MSW, [place] Office: [phone] Cell: [phone] Fax: [phone]

## 2022-08-05 NOTE — Progress Notes (Signed)
Occupational Therapy Note  Patient Details  Name: Gina Kelley MRN: [id number] Date of Birth: Sep 01, 1935  Today's Date: 08/05/2022 OT Missed Time: 23 Minutes Missed Time Reason: Other (comment) (family present and wanted to spend time with them before daughter left to go back home to another state)  Pt greeted semi-reclined in bed asleep with spouse and daughter present. Pt's daughter mis-read schedule and thought she had two more hours with her mom before therapy. Family  requested to spend time with them before daughter left to go back home to another state. Pt's daughter tearful so OT will attempt to move schedule around and return today if possible.  OT unable to move schedule to accommodate return on this fate. Will follow up per plan of care.    Daneen Schick [place] Kadlec 08/05/2022, 2:50 PM

## 2022-08-05 NOTE — Patient Care Conference (Signed)
Inpatient RehabilitationTeam Conference and Plan of Care Update Date: 08/05/2022   Time: 10:51 AM   Patient Name: Gina Kelley      Medical Record Number: [id number]  Date of Birth: 10-16-1935 Sex: Female         Room/Bed: 4W23C/4W23C-01 Payor Info: Payor: [employer] MEDICARE / Plan: [employer] MEDICARE HMO / Product Type: *No Product type* /    Admit Date/Time:  08/02/2022  1:50 PM  Primary Diagnosis:  Subdural hematoma [facility])  Hospital Problems: Principal Problem:   Subdural hematoma [facility])    Expected Discharge Date: Expected Discharge Date: 08/27/22  Team Members Present: Physician leading conference: Dr. Durel Salts Social Worker Present: Loralee Pacas, [place] Nurse Present: Tacy Learn, RN PT Present: Terence Lux, PT OT Present: Cherylynn Ridges, OT SLP Present: Weston Anna, SLP     Current Status/Progress Goal Weekly Team Focus  Bowel/Bladder   Currently has internal catheter in place. Foley to be D/C 2/5. Incontinent of bowel with stools being type 6 to type 7 last few days.   Work towards patient becoming continent of bowel and bladder. Assess for urinary retention Q6 to Q8 (decrease output with current foley).   Assess tolieting needs QShift and PRN.    Swallow/Nutrition/ Hydration   NPO with PEG   Mod A  trials with SLP only, functioanl use of oral-motor musculature    ADL's   Total A +2   Min/Mod A overall   L UE NMR, transfers, L attention, balance, self-care retraining    Mobility   MaxA for bed mobility and transfers via squat pivot; MinA/CGA for sitting balance with L lateral and posterior lean.   MinA with LRAD for functional mobility and Supv for wheelchair mobility  L LE NMR, midline posturing, synamic sitting balance, bed mobility, transfers, L visual field/tracking, sit/stands, discharge planning    Communication   Max-Total A   Mod A   multimodal communicaton to express wants/needs, speech intelligibility    Safety/Cognition/  Behavioral Observations  Max A   Mod A   functional problem solving, sustained attention, visual scanning    Pain   Pain is difficult to assess for patient. Uses communication board or family able to advocate if patient endorses any pain. However, pain has been less than <1 for the past few days. Pain continues to be in right leg that continues to have episodes of clonus.   Continue to manage pain level <3.   Assess pain Qshift and PRN.    Skin   Stage 2 PW to sacrum area with no slough. Red and pink shallow base. Jeunostomy Peg left area of abdomen. Open to air incision to left side of upper area of head after recent craniotomy.   Maintain skin integrity. Continue to prevent further break down of skin. Continue treatment for current skin issues. Continue to prevent infection.  Assess skin QShift and PRN.      Discharge Planning:  D/c remains pending at this time as family is working towards establishing 24/7 care. SW provided family with sitter list for further review. At this, discharge plan appears to be intial support at discharge for 24/7 care, however, does not appear to be concrete plan. SW will confirm there are no barriers to discharge.   Team Discussion: SDH. Incontinent B/B with foley removed 08/04/22. Denies pain. Pressure injury 01/18 present on admission. Air mattress. Incision to head 01/09 no drainage. Left hemi. Monitoring B/P. AFO. ABD binder. PEG tube. Daily weight. Lower lip swelling improving. Ace inhibitor  stopped. OT max +2 with ModA goals. PT Max with min/mod goals. SLP-NPO. Delayed. Communication board is not effective at this point. Cognition/awareness is poor.  Patient on target to meet rehab goals: May have to downgrade. Will evaluate next week.   *See Care Plan and progress notes for long and short-term goals.   Revisions to Treatment Plan:  Medication adjustments. Monitor B/P. Monitor labs. Monitor facial swelling  Teaching Needs: Medications, safety, self  care, skin care, gait/transfer training, communication needs, etc  Current Barriers to Discharge: Incontinence, Wound care, Lack of/limited family support, Weight, Weight bearing restrictions, and Medication compliance  Possible Resolutions to Barriers: Family education, nursing education, order recommended DME     Medical Summary Current Status: medically complicated by dense hemineglect, dysphagia s/p PEG tube, surgical and sacral wounds, urinary incontinence, diarrhea, facial swelling, hypertension, hyponatremia, anemia  Barriers to Discharge: Complicated Wound;Electrolyte abnormality;Inadequate Nutritional Intake;Incontinence;Medical stability;Self-care education;Symptomatic Anemia;Uncontrolled Hypertension  Barriers to Discharge Comments: urinary incontinence w/ foley, wound healing, facial swelling, hemineglect Possible Resolutions to [employer] Focus: medication adjustments, nutrition and mobility to optimize wound healing, possible DC foley trial   Continued Need for Acute Rehabilitation Level of Care: The patient requires daily medical management by a physician with specialized training in physical medicine and rehabilitation for the following reasons: Direction of a multidisciplinary physical rehabilitation program to maximize functional independence : Yes Medical management of patient stability for increased activity during participation in an intensive rehabilitation regime.: Yes Analysis of laboratory values and/or radiology reports with any subsequent need for medication adjustment and/or medical intervention. : Yes   I attest that I was present, lead the team conference, and concur with the assessment and plan of the team.   Ernest Pine 08/05/2022, 2:08 PM

## 2022-08-05 NOTE — Progress Notes (Signed)
Physical Therapy Session Note  Patient Details  Name: Gina Kelley MRN: [id number] Date of Birth: 28-May-1936  Today's Date: 08/05/2022 PT Individual Time: 1st Treatment Session: 1100-1200; 2nd Treatment Session: MISSED 45 MIN SECONDARY TO FATIGUE PT Individual Time Calculation (min): 60 min; 0 min  Short Term Goals: Week 1:  PT Short Term Goal 1 (Week 1): Pt will roll with max A or less PT Short Term Goal 2 (Week 1): Pt will perform supine to sit with max A PT Short Term Goal 3 (Week 1): Pt will require min A for static sitting balance  Skilled Therapeutic Interventions/Progress Updates:  Patient greeted reclined in TIS wheelchair with daughter present and agreeable to PT treatment session. Patient wheeled to rehab gym for time management and energy conservation.   Attempted to have patient stand at R hemi-rail in hallway, however patient demonstrated significant posterior lean/pushing excessively requiring activity to be discontinued.   Patient stood in the standing frame x13' and x6' with rehab tech providing L shoulder support and therapist sitting in front of patient providing multimodal cues for improved posturing and attention to L side. During each stand, patient was tasked with locating squigz of various colors on her L side and reaching across her body for them with her R UE and then placing them in therapist's hand located further on her left. Patient required Min/ModA at times for located squigz on her L secondary to R gaze preference. Patient was able to accurately locate each color with 90% accuracy (confused orange and yellow). As she fatigued, she required [facility] assistance for initiating picking up each squigz, but was able to place in therapist's hand without physical assistance.   Patient returned to her room and performed dependent squat pivot transfer from wheelchair to sitting EOB. While sitting, patient required CGA for improved sitting balance. Patient transitioned from  sitting EOB to supine with Max/TotalA. Once supine, patient noted to be incontinent of urine/BM. Patient rolled to the L with [facility] assistance for placing R UE on the bed rail, however patient pushes instead of pulls. Patient left sidelying with RN and NT present to assist with pericare.    2nd Treatment Session- Attempted to see patient for scheduled afternoon treatment session, however unable to participate secondary to significant fatigue. Despite multiple attempts, patient continued to close her eyes and eventually opened them but shook her head saying "no" when asked if she wanted to participate in therapy. Patient has missed 45 minute of scheduled PT treatment session at this time.     Therapy Documentation Precautions:  Precautions Precautions: Fall Precaution Comments: peg tube. HOB no lower than 30*. Left dense hemi. right gaze preference, left side inattention Required Braces or Orthoses: Other Brace Other Brace: PRAFO boots on in bed Restrictions Weight Bearing Restrictions: No  Therapy/Group: Individual Therapy  Shandee Jergens 08/05/2022, 7:56 AM

## 2022-08-05 NOTE — Progress Notes (Addendum)
PROGRESS NOTE   Subjective/Complaints:  No events overnight. Complains of pain in her left shoulder this AM. Developing R shoulder tone, mild subluxations, OT to tape and start e-stim.   Per SW, none of the family lives with the patient, although they are very supportive.   ROS: limited due to language/communication   Objective:   No results found. Recent Labs    08/04/22 0649 08/05/22 0650  WBC 10.2 7.8  HGB 6.8* 7.4*  HCT 21.4* 23.6*  PLT 384 405*    Recent Labs    08/02/22 1456 08/04/22 0649  NA 135 134*  K 3.7 3.5  CL 99 99  CO2 27 28  GLUCOSE 151* 146*  BUN 20 22  CREATININE 0.61 0.62  CALCIUM 8.2* 8.1*     Intake/Output Summary (Last 24 hours) at 08/05/2022 1140 Last data filed at 08/05/2022 0541 Gross per 24 hour  Intake --  Output 750 ml  Net -750 ml      Pressure Injury 07/17/22 Sacrum Left Stage 2 -  Partial thickness loss of dermis presenting as a shallow open injury with a red, pink wound bed without slough. (Active)  07/17/22 1700  Location: Sacrum  Location Orientation: Left  Staging: Stage 2 -  Partial thickness loss of dermis presenting as a shallow open injury with a red, pink wound bed without slough.  Wound Description (Comments):   Present on Admission: No    Physical Exam: Vital Signs Blood pressure (!) 146/78, pulse 94, temperature 99.2 F (37.3 C), resp. rate 16, height 5\' 3"  (1.6 m), weight 93.3 kg, SpO2 97 %.  Constitutional: No distress . Vital signs reviewed. HEENT: NCAT, lip swelling, particular lower - mildly improved. White, frothy sputum held in mouth.  Neck: supple, forward flexed.  Cardiovascular: RRR without murmur. No JVD    Respiratory/Chest: Mild course lung sounds b.l.  Normal effort. Extremely weak cough on exam.  GI/Abdomen: BS +, non-tender, +PEG Ext: no clubbing, cyanosis. LUE 2+ Edema Psych: pleasant and cooperative  GU: Not examined. Foley  removed Skin: C/D/I. No apparent lesions. +LUE IV, c/d/I. +Peg    MSK:      mild <1/2 fingerbreadth subluxation L shoulder      Strength: unchanged 2/6                LUE: Flaccid with ? 2/5 elbow extension                LLE:  4-/5 proximal, 3/5 distal   Neurologic exam:  Cognition: AAO to person, place; time with options; alert. Follows simple commands, reacts to verbal input. Cannot verbalize but communicates with gestures/hands. Follows directions Insight: Fair insight into current condition. Knows she had a stroke.   + LUE Hoffman's; +L facial droop. + eyes dont deviate past midline L. + L shoulder weakness--no change Spasticity: mild tone in neck, can range to neutral. BL foot clonus with intention.   Assessment/Plan: 1. Functional deficits which require 3+ hours per day of interdisciplinary therapy in a comprehensive inpatient rehab setting. Physiatrist is providing close team supervision and 24 hour management of active medical problems listed below. Physiatrist and rehab team continue to assess barriers  to discharge/monitor patient progress toward functional and medical goals  Care Tool:  Bathing    Body parts bathed by patient: Face   Body parts bathed by helper: Left lower leg, Right lower leg, Left upper leg, Right upper leg, Buttocks, Front perineal area, Abdomen, Chest, Left arm, Right arm     Bathing assist Assist Level: Total Assistance - Patient < 25%     Upper Body Dressing/Undressing Upper body dressing   What is the patient wearing?: Pull over shirt    Upper body assist Assist Level: Total Assistance - Patient < 25%    Lower Body Dressing/Undressing Lower body dressing      What is the patient wearing?: Pants, Incontinence brief     Lower body assist Assist for lower body dressing: Dependent - Patient 0%     Toileting Toileting    Toileting assist Assist for toileting: Dependent - Patient 0%     Transfers Chair/bed transfer  Transfers  assist  Chair/bed transfer activity did not occur: Safety/medical concerns (unable 2/2 pain, fatigue, weakness)  Chair/bed transfer assist level: 2 Helpers     Locomotion Ambulation   Ambulation assist   Ambulation activity did not occur: Safety/medical concerns (unable 2/2 pain, fatigue, weakness)          Walk 10 feet activity   Assist  Walk 10 feet activity did not occur: Safety/medical concerns        Walk 50 feet activity   Assist Walk 50 feet with 2 turns activity did not occur: Safety/medical concerns         Walk 150 feet activity   Assist Walk 150 feet activity did not occur: Safety/medical concerns         Walk 10 feet on uneven surface  activity   Assist Walk 10 feet on uneven surfaces activity did not occur: Safety/medical concerns         Wheelchair     Assist Is the patient using a wheelchair?: Yes Type of Wheelchair: Manual Wheelchair activity did not occur: Safety/medical concerns (unable 2/2 pain, fatigue, weakness)    Max wheelchair distance: 0    Wheelchair 50 feet with 2 turns activity    Assist    Wheelchair 50 feet with 2 turns activity did not occur: Safety/medical concerns       Wheelchair 150 feet activity     Assist  Wheelchair 150 feet activity did not occur: Safety/medical concerns       Blood pressure (!) 146/78, pulse 94, temperature 99.2 F (37.3 C), resp. rate 16, height 5\' 3"  (1.6 m), weight 93.3 kg, SpO2 97 %.    Medical Problem List and Plan: 1. Functional deficits secondary to subdural hematoma and ICH             - patient may shower with PEG site covered             - ELOS/Goals: 14-16 days, mod assist PT, mod assist OT, mod assist SLP  - goal DC 2/28             - Does well communicating with writing board and fingers for y/n             - Family requests change from DNR to full code on admission; changed   - Continue CIR therapies including PT, OT, and SLP    - 2/6 ordered L  PRAFO, WHO, and soft cervical collar to help with head positioning OOB 2.  Antithrombotics: -DVT/anticoagulation:  Mechanical:  Antiembolism stockings, knee (TED hose) Bilateral lower extremities             -antiplatelet therapy: none   3. Pain Management: Tylenol as needed    - 2/6: Voltaren gel to L shoulder for pain   4. Mood/Behavior/Sleep: LCSW to evaluate and provide emotional support             -antipsychotic agents: n/a   5. Neuropsych/cognition: This patient is not capable of making decisions on her own behalf.   6. Skin/Wound Care: Routine skin care checks - coccyx PI with scab per nursing report - will get photo today - LAL mattress    7. Fluids/Electrolytes/Nutrition: Routine Is and Os and follow-up chemistries             -NPO; has PEG             -continue Glucerna 1.5 continuous Tfs              - Consult dietary to follow for titration - BG mildly elevated but in good range given age, monitor Recent Labs    08/04/22 2358 08/05/22 0413 08/05/22 0827  GLUCAP 153* 138* 135*       8: Hypertension: monitor TID and prn             -continue amlodipine 10 mg daily             -losartan 50 mg daily -> Dced 2/3 d/t concern for angioedema               - Add hydralazine 10 mg PRN for SBP >180, DBP>100    08/05/2022    4:14 AM 08/04/2022    7:51 PM 08/04/2022    1:12 PM  Vitals with BMI  Systolic 132 440 102  Diastolic 78 80 74  Pulse 94 97 90   2/4: BP stable/high; mild tachycardia this AM; if AM labs do not show dehydration, would start HCTZ 12.5 mg daily for adjunctive control  2/5 BP with improvement over last 12 hours--will hold off on adding any further medication right now.  2/6 - mild HTN, no med changes, labs in AM  9: Hyperlipidemia: continue statin, Zetia   10: SDH/ICH s/p crani: Finished Keppra for sz ppx.    11: Leukocytosis: current temperature 99.3, ceftazidime completed 1/29             -Follow-up CBC with differential, mild WBC increase 2.2  likely d/t POD#1; downtrending 2/3               - Fever overnight prior to admission; POD from PEG, monitor for recurrence  - No fever recurrence 2/3; monitor  - 2/6: Labs in AM. CXR today for course lung sounds, and duplex for LUE swelling. Strict oral care QID   12: Anemia, multifactorial:               -Follow-up CBC -hgb with sl decrease again today, normocytic  -check stool for OB  -PA added b-supp. Add Fe++ too   13. ?Angioedema vs. Chlorhexadine reaction - improving             - Improving per family after DC chlorhexidine skin wipes and oral mouthwash              - Per SLP notes, interferes with tongue movement, speaking             - Hx cough with Enalipril, Losartan increased from OP 25 mg to 50 mg  1/14, never weaned; edema started 1/30 per chart review             - d/w pharmacy 2/4 and agree that reaction can occur at any time, and may cross-react with ARB. Will attempt to titrate BP meds without ACE/ARBs and monitor swelling.   14. L hemineglect - causing R gaze preference and positional tone             - Repositioning in bed to straighten head--head more neutral today 2/5             - May need to consider botox given need to recover cognitive function and relatively little tone elsewhere  - 2/6: E stim and taping to L shoulder for mild sublux per OT.   15. Urinary incontinence  - Foley removed 2/6  - PVRs QID and ISC for >350 ccs retention  16. Dysphagia, s/p PEG placement.   - Remains NPO  - Strict oral care QID   - CXR today given course breath sounds, weak cough, poor secretion clearance  LOS: 3 days A FACE TO FACE EVALUATION WAS PERFORMED  Gertie Gowda 08/05/2022, 11:40 AM

## 2022-08-05 NOTE — Progress Notes (Addendum)
Occupational Therapy Session Note  Patient Details  Name: Gina Kelley MRN: [id number] Date of Birth: 11-May-1936  Today's Date: 08/05/2022 Session 1 OT Individual Time: 9924-2683 OT Individual Time Calculation (min): 60 min    Short Term Goals: Week 1:  OT Short Term Goal 1 (Week 1): Pt will complete UB bathing with Max A seated on EOB or at sink. OT Short Term Goal 2 (Week 1): Pt will complete LB bating with Max A seated on EOB or at sink. OT Short Term Goal 3 (Week 1): Pt will increase bed mobility while transitioning to seated on EOB from supine with Max A. OT Short Term Goal 4 (Week 1): Pt will increase static sitting balance to [employer] while completing functional self care task. OT Short Term Goal 5 (Week 1): Pt will increase attention to her immediate environment by locating all items needed for grooming task when placed to her left.  Skilled Therapeutic Interventions/Progress Updates:  Session 1   Pt greeted semi-reclined in bed awake. Pt incontinent of urin and loose BM. Bed level rolling for peri-care and brief change with max/total A +2. Total A +1 to come to sitting EOB. Worked on sitting balance while bathing at EOB. OT removed abdominal binder and pt initiated washing abdomen and chest. Pt needed max A for UB bathing while +2 provided assistance for sitting balance ranging from min to mod A. Intermittent bouts of close supervision. OT educated on hemi dressing techniques but still needed max A for UB dressing at EOB. Total A to thread pant legs, then used 3 muskateers technique for sit<>stand at EOB to pull up pants with total A. Facilitated pivot to TIS wc with total A +2 3 muskateers. Pt set-up for L UE support in TIS wc, alarm belt on, call bell in reach, and needs met.   Therapy Documentation Precautions:  Precautions Precautions: Fall Precaution Comments: peg tube. HOB no lower than 30*. Left dense hemi. right gaze preference, left side inattention Required Braces or  Orthoses: Other Brace Other Brace: PRAFO boots on in bed Restrictions Weight Bearing Restrictions: No Pain:  Expressed some shoulder pain with pointed. Repositioned for support and comfort.    Therapy/Group: Individual Therapy  Valma Cava 08/05/2022, 9:24 AM

## 2022-08-06 ENCOUNTER — Inpatient Hospital Stay (HOSPITAL_COMMUNITY): Payer: Medicare HMO

## 2022-08-06 ENCOUNTER — Encounter (HOSPITAL_COMMUNITY): Payer: Self-pay | Admitting: Surgery

## 2022-08-06 DIAGNOSIS — M7989 Other specified soft tissue disorders: Secondary | ICD-10-CM | POA: Diagnosis not present

## 2022-08-06 LAB — BASIC METABOLIC PANEL
Anion gap: 9 (ref 5–15)
BUN: 38 mg/dL — ABNORMAL HIGH (ref 8–23)
CO2: 31 mmol/L (ref 22–32)
Calcium: 8.4 mg/dL — ABNORMAL LOW (ref 8.9–10.3)
Chloride: 97 mmol/L — ABNORMAL LOW (ref 98–111)
Creatinine, Ser: 0.64 mg/dL (ref 0.44–1.00)
GFR, Estimated: >60 mL/min (ref >60)
Glucose, Bld: 145 mg/dL — ABNORMAL HIGH (ref 70–99)
Potassium: 3.8 mmol/L (ref 3.5–5.1)
Sodium: 137 mmol/L (ref 135–145)

## 2022-08-06 LAB — CBC
HCT: 22.5 % — ABNORMAL LOW (ref 36.0–46.0)
Hemoglobin: 7.1 g/dL — ABNORMAL LOW (ref 12.0–15.0)
MCH: 28.2 pg (ref 26.0–34.0)
MCHC: 31.6 g/dL (ref 30.0–36.0)
MCV: 89.3 fL (ref 80.0–100.0)
Platelets: 344 10*3/uL (ref 150–400)
RBC: 2.52 MIL/uL — ABNORMAL LOW (ref 3.87–5.11)
RDW: 17 % — ABNORMAL HIGH (ref 11.5–15.5)
WBC: 7 10*3/uL (ref 4.0–10.5)
nRBC: 0.3 % — ABNORMAL HIGH (ref 0.0–0.2)

## 2022-08-06 LAB — GLUCOSE, CAPILLARY
Glucose-Capillary: 132 mg/dL — ABNORMAL HIGH (ref 70–99)
Glucose-Capillary: 148 mg/dL — ABNORMAL HIGH (ref 70–99)
Glucose-Capillary: 152 mg/dL — ABNORMAL HIGH (ref 70–99)
Glucose-Capillary: 145 mg/dL — ABNORMAL HIGH (ref 70–99)
Glucose-Capillary: 188 mg/dL — ABNORMAL HIGH (ref 70–99)

## 2022-08-06 MED ORDER — LOPERAMIDE HCL 1 MG/7.5ML PO SUSP: 2.0000 mg | ORAL | Status: DC | PRN

## 2022-08-06 MED ORDER — HYDROCHLOROTHIAZIDE 10 MG/ML ORAL SUSPENSION: 6.2500 mg | Freq: Every day | ORAL | Status: DC

## 2022-08-06 MED ORDER — LOPERAMIDE HCL 2 MG PO CAPS: 2.0000 mg | ORAL_CAPSULE | ORAL | Status: DC | PRN

## 2022-08-06 MED ORDER — HYDROCHLOROTHIAZIDE 12.5 MG PO TABS
6.2500 mg | ORAL_TABLET | Freq: Every day | ORAL | Status: DC
  Administered 2022-08-06 – 2022-08-08 (×3): 6.25 mg
  Filled 2022-08-06 (×3): qty 1

## 2022-08-06 NOTE — Progress Notes (Addendum)
Speech Language Pathology Daily Session Note  Patient Details  Name: Gina Kelley MRN: [id number] Date of Birth: 03-23-1936  Today's Date: 08/06/2022 SLP Individual Time: 1015-1058 SLP Individual Time Calculation (min): 43 min  Short Term Goals: Week 1: SLP Short Term Goal 1 (Week 1): Pt will consume therapeutic trials of POs (water or nectar thick liquids) with max assist to contain boluses orally and initiate swallow. SLP Short Term Goal 2 (Week 1): Pt will locate objects at midline during functional tasks in 50% of opportunities with max assist multimodal cues. SLP Short Term Goal 3 (Week 1): Pt will use overarticulation and increased vocal intensity to achieve intelligibility at the single phoneme level with max assist multimodal cues. SLP Short Term Goal 4 (Week 1): Pt will sustain her attention to functional tasks for ~3 minute intervals with max cues for redirection. SLP Short Term Goal 5 (Week 1): Pt will communicate basic, immediate wants and needs via multiple modalities with max assist multimodal cues.  Skilled Therapeutic Interventions: Pt seen this date for skilled ST intervention targeting swallowing and communication  goals outlined above. Pt received awake and OOB in TIS with neck pillow and rolled up washcloths utilized to assist in maintained head neutral alignment - head noted to be in forward flexion likely due to fatigue. Agreeable to intervention via head nod. Lingual and labial edema noted to be improved, in comparison to prior session notes.  Today's session with emphasis on oral motor skills, AAC, vocalizations, and oral care. SLP provided Total A for oral care via suction toothbrush for thoroughness. Tolerated wet and dry oral swab x 3 with swallow initation noted on 2 out of 3 trials though appeared delayed (benefited from verbal cues to swallow). Wet vocal quality appreciated after one swallow with wet swab with pt producing weak throat clear given verbal and  demonstration cues. Completed simple oral motor exercises to include lingual sweeps and smile/pucker combinations with demonstration cue (good effort and fair accuracy).   Re: communication and motor speech, when pt was given Max A, pt verbalized "yeah," "okay," "no," and "hey/hi" on 10 different trials. Max  to Total A multimodal cues to accurately utilize writing and picture board; inconsistent writing despite model prompts and tracing (benefited from hand-over-hand for motor planning). Also targeted pointing to objects in room, as method of communication, with pt only pointing to objects accurately on ~40% of trials due to decreased visual scanning and locating items L of midline; Max A multimodal cues for attention to L field. Pt was noted to nod head yes/no and give "okay" gesture appropriately during today's session with Sup A to Min A.   Continue to recommend addressing AAC methods during upcoming therapy sessions, as well as trialing wet oral swabs. Pt left in room and OOB in TIS with all safety measures activated and call bell within reach. Continue per current ST POC.  Pain No non-verbal indicators of pain present during today's session; NAD  Therapy/Group: Individual Therapy  Lecretia Buczek A Jabre Heo 08/06/2022, 1:48 PM

## 2022-08-06 NOTE — Progress Notes (Signed)
Physical Therapy Session Note  Patient Details  Name: Gina Kelley MRN: [id number] Date of Birth: 1935-12-30  Today's Date: 08/06/2022 PT Individual Time: 1st Treatment Session: [phone]; 2nd Treatment Session: 1100-1200 PT Individual Time Calculation (min): 30 min; 60 min  Short Term Goals: Week 1:  PT Short Term Goal 1 (Week 1): Pt will roll with max A or less PT Short Term Goal 2 (Week 1): Pt will perform supine to sit with max A PT Short Term Goal 3 (Week 1): Pt will require min A for static sitting balance  Skilled Therapeutic Interventions/Progress Updates:  1st Treatment Session- Patient greeted supine in bed and agreeable to PT treatment session. Patient presented with significant R head rotation and flexion and will need improved positioning assistance at night for neutral posturing. Patient transitioned from supine to rolling to her R with Mod/MaxA secondary to notable pushing from R UE instead of pulling. Patient transitioned to sitting EOB with Max/TotalA as patient reaches out for railing and holds on instead of pushing her trunk upright. While sitting EOB, patient required SBA/MinA for sitting balance. Therapist donned abdominal binder and checked peg tube site. Patient performed squat pivot transfer from EOB to TIS wheelchair with TotalA and patient grabbing onto bed rail inhibiting transfer. Patient positioned in wheelchair with neck pillow and washcloths to improve neutral head alignment. Patient left reclined in TIS with both call bells in lap, pillow under L UE and all needs met- RN and NT notified of patient positioning, as well as ST.    2nd Treatment Session- Patient greeted reclined in [place] wheelchair with daughter present and agreeable to PT treatment session. Therapist spent time educating family member regarding limiting visitation hours during the day in order to ensure patient is getting adequate amounts of rest during throughout the day- Daughter agreeable and  thankful for recommendations. Therapist also educated daughter regarding sitting on patient's L side and how to apply a gentle stretch and ROM to head/neck to improve midline posturing. Patient reported brief was clean, however when therapist checked patient was completely soiled with massive loose stools. Patient transferred from [facility] wheelchair to sitting EOB via TotalA squat pivot- Prior to performing transfer, patient was able to demonstrate minimal scooting forward in wheelchair with good effort noted (would trial slideboard transfer, however with how soiled patient is throughout the day and current pressure injury I do not believe it would be a good idea). Patient transitioned from sitting EOB to supine with TotalA x2- Therapist managing B LE and rehab tech managing trunk. Patient boosted toward EOB with TotalA x2. Patient rolled L and R with Mod/MaxA while RN performed extensive pericare. While supine, therapist donned pants and pulled over hips while patient was on her side. Patient transitioned from supine to sitting EOB with TotalA. While sitting EOB, patient was able to maintain sitting balance with SBA/MinA and MAX VC for increased anterior weight shift in order to improve overall stability. While sitting EOB, therapist donned shirt with MaxA. Patient sat EOB ~5-10 minutes while working on sitting balance and visual tracking/scanning to the L. Patient returned to supine in bed with TotalA and left supine with neck pillow positioned with washcloths to improve positioning, B heels floated, L UE elevated, call bell within reach and all needs met.    Therapy Documentation Precautions:  Precautions Precautions: Fall Precaution Comments: peg tube. HOB no lower than 30*. Left dense hemi. right gaze preference, left side inattention Required Braces or Orthoses: Other Brace Other Brace: PRAFO boots  on in bed Restrictions Weight Bearing Restrictions: No   Therapy/Group: Individual Therapy  [name] 08/06/2022, 7:56 AM

## 2022-08-06 NOTE — Progress Notes (Signed)
Occupational Therapy Session Note  Patient Details  Name: Gina Kelley MRN: [id number] Date of Birth: 04/07/1936  Today's Date: 08/06/2022 OT Individual Time:  -  1330-1430   60 min   Short Term Goals: Week 1:  OT Short Term Goal 1 (Week 1): Pt will complete UB bathing with Max A seated on EOB or at sink. OT Short Term Goal 2 (Week 1): Pt will complete LB bating with Max A seated on EOB or at sink. OT Short Term Goal 3 (Week 1): Pt will increase bed mobility while transitioning to seated on EOB from supine with Max A. OT Short Term Goal 4 (Week 1): Pt will increase static sitting balance to [employer] while completing functional self care task. OT Short Term Goal 5 (Week 1): Pt will increase attention to her immediate environment by locating all items needed for grooming task when placed to her left.  Skilled Therapeutic Interventions/Progress Updates:     Pt received in bed asleep, but arousable, pt with no indication of pain and lethargic. Pt more arousable with gospel choir music playing in background often tapping B feet agains footboard. ADL: Pt wet in bed. Pt able to assume hooklying then needs MAX A in both directions to turn for OT to change brief and readjust pants.   Therapeutic exercise Prolonged cervical stretching- lateral flexion, rotation, and extension prolonged stretch x3 min 2x with rest after first round.  Therapeutic activity K tape applied to L hand for edema managment [place] Stim One- deltoid attended estim; edu to pt about estim and decreasing risk of subluxation by activating deltoid 330 pulse width 35 Hz pulse rate On 8 sec/ off 8 sec Ramp up/ down 2 sec Symmetrical Biphasic wave form  Max intensity 174mA at 500 Ohm load   Pt left at end of session in bed with exit alarm on, call light in reach and all needs met   Therapy Documentation Precautions:  Precautions Precautions: Fall Precaution Comments: peg tube. HOB no lower than 30*. Left dense  hemi. right gaze preference, left side inattention Required Braces or Orthoses: Other Brace Other Brace: PRAFO boots on in bed Restrictions Weight Bearing Restrictions: No  Therapy/Group: Individual Therapy  Tonny Branch 08/06/2022, 2:32 PM

## 2022-08-06 NOTE — Progress Notes (Signed)
PROGRESS NOTE   Subjective/Complaints:  No events overnight. Continues to develop LUE swelling, pain; minimal tone at this point. Sleeping well. No other complaints.   ROS: limited due to language/communication   Objective:   DG CHEST PORT 1 VIEW  Result Date: 08/05/2022 CLINICAL DATA:  Aspiration EXAM: PORTABLE CHEST 1 VIEW COMPARISON:  07/14/2022 FINDINGS: Subsegmental atelectasis identified right base. Right apex is obscured by overlapping structures. Left lung grossly clear. Calcified aorta. Thoracic degenerative changes. Unremarkable cardiac silhouette. IMPRESSION: Right base subsegmental atelectasis.  Study limited by positioning. Electronically Signed   By: Sammie Bench M.D.   On: 08/05/2022 15:12   Recent Labs    08/05/22 0650 08/06/22 0723  WBC 7.8 7.0  HGB 7.4* 7.1*  HCT 23.6* 22.5*  PLT 405* 344    Recent Labs    08/04/22 0649 08/06/22 0723  NA 134* 137  K 3.5 3.8  CL 99 97*  CO2 28 31  GLUCOSE 146* 145*  BUN 22 38*  CREATININE 0.62 0.64  CALCIUM 8.1* 8.4*     Intake/Output Summary (Last 24 hours) at 08/06/2022 1050 Last data filed at 08/06/2022 0700 Gross per 24 hour  Intake 4601.57 ml  Output --  Net 4601.57 ml      Pressure Injury 07/17/22 Sacrum Left Stage 2 -  Partial thickness loss of dermis presenting as a shallow open injury with a red, pink wound bed without slough. (Active)  07/17/22 1700  Location: Sacrum  Location Orientation: Left  Staging: Stage 2 -  Partial thickness loss of dermis presenting as a shallow open injury with a red, pink wound bed without slough.  Wound Description (Comments):   Present on Admission: Yes    Physical Exam: Vital Signs Blood pressure (!) 147/81, pulse 91, temperature 99.7 F (37.6 C), temperature source Oral, resp. rate 17, height 5\' 3"  (1.6 m), weight 94.8 kg, SpO2 95 %.  Constitutional: No distress . Vital signs reviewed. HEENT: NCAT, lip  swelling, particular lower - improved. Tongue slightly extruded.  Neck: supple, forward flexed.  Cardiovascular: RRR without murmur. No JVD    Respiratory/Chest: Mild course lung sounds b.l.  Normal effort.  GI/Abdomen: BS +, non-tender, +PEG Ext: no clubbing, cyanosis. LUE 2+ Edema Psych: pleasant and cooperative  Skin: C/D/I. No apparent lesions. +LUE IV, c/d/I. +Peg   + Sacral wound  - scabbed  MSK:      1/2 fingerbreadth subluxation L shoulder      Strength: unchanged 2/6                LUE: Flaccid                 LLE:  4-/5 proximal, 3/5 distal   Neurologic exam:  Cognition: AAO to person, place; time with options; alert. Follows simple commands, reacts to verbal input. Occassional 1 word dysarthric verbalizations. communicates mostly with gestures/hands, 80% intelligability. Follows directions Insight: Fair insight into current condition. Knows she had a stroke.   + LUE Hoffman's; +L facial droop. + eyes dont deviate past midline L. + L shoulder weakness--no change Spasticity: mild tone in neck, can range to neutral. BL foot clonus with intention.   Assessment/Plan:  1. Functional deficits which require 3+ hours per day of interdisciplinary therapy in a comprehensive inpatient rehab setting. Physiatrist is providing close team supervision and 24 hour management of active medical problems listed below. Physiatrist and rehab team continue to assess barriers to discharge/monitor patient progress toward functional and medical goals  Care Tool:  Bathing    Body parts bathed by patient: Face   Body parts bathed by helper: Left lower leg, Right lower leg, Left upper leg, Right upper leg, Buttocks, Front perineal area, Abdomen, Chest, Left arm, Right arm     Bathing assist Assist Level: Total Assistance - Patient < 25%     Upper Body Dressing/Undressing Upper body dressing   What is the patient wearing?: Pull over shirt    Upper body assist Assist Level: Total Assistance -  Patient < 25%    Lower Body Dressing/Undressing Lower body dressing      What is the patient wearing?: Pants, Incontinence brief     Lower body assist Assist for lower body dressing: Dependent - Patient 0%     Toileting Toileting    Toileting assist Assist for toileting: Dependent - Patient 0%     Transfers Chair/bed transfer  Transfers assist  Chair/bed transfer activity did not occur: Safety/medical concerns (unable 2/2 pain, fatigue, weakness)  Chair/bed transfer assist level: 2 Helpers     Locomotion Ambulation   Ambulation assist   Ambulation activity did not occur: Safety/medical concerns (unable 2/2 pain, fatigue, weakness)          Walk 10 feet activity   Assist  Walk 10 feet activity did not occur: Safety/medical concerns        Walk 50 feet activity   Assist Walk 50 feet with 2 turns activity did not occur: Safety/medical concerns         Walk 150 feet activity   Assist Walk 150 feet activity did not occur: Safety/medical concerns         Walk 10 feet on uneven surface  activity   Assist Walk 10 feet on uneven surfaces activity did not occur: Safety/medical concerns         Wheelchair     Assist Is the patient using a wheelchair?: Yes Type of Wheelchair: Manual Wheelchair activity did not occur: Safety/medical concerns (unable 2/2 pain, fatigue, weakness)    Max wheelchair distance: 0    Wheelchair 50 feet with 2 turns activity    Assist    Wheelchair 50 feet with 2 turns activity did not occur: Safety/medical concerns       Wheelchair 150 feet activity     Assist  Wheelchair 150 feet activity did not occur: Safety/medical concerns       Blood pressure (!) 147/81, pulse 91, temperature 99.7 F (37.6 C), temperature source Oral, resp. rate 17, height 5\' 3"  (1.6 m), weight 94.8 kg, SpO2 95 %.    Medical Problem List and Plan: 1. Functional deficits secondary to subdural hematoma and ICH              - patient may shower with PEG site covered             - ELOS/Goals: 14-16 days, mod assist PT, mod assist OT, mod assist SLP  - goal DC 2/28             - Does well communicating with writing board and fingers for y/n             - Family requests change  from DNR to full code on admission; changed   - Continue CIR therapies including PT, OT, and SLP    - 2/6 ordered L PRAFO, WHO, and soft cervical collar to help with head positioning OOB  2.  Antithrombotics: -DVT/anticoagulation:  Mechanical:  Antiembolism stockings, knee (TED hose) Bilateral lower extremities             -antiplatelet therapy: none   - Duplex LUE pending   3. Pain Management: Tylenol as needed    - 2/6: lidocaine patch to L shoulder for pain - benefitting   4. Mood/Behavior/Sleep: LCSW to evaluate and provide emotional support             -antipsychotic agents: n/a   5. Neuropsych/cognition: This patient is not capable of making decisions on her own behalf.   6. Skin/Wound Care: Routine skin care checks - coccyx PI with scab  - routine care - LAL mattress    7. Fluids/Electrolytes/Nutrition: Routine Is and Os and follow-up chemistries             -NPO; has PEG             -continue Glucerna 1.5 continuous Tfs              - Consult dietary to follow for titration - BG mildly elevated but in good range given age, monitor Recent Labs    08/05/22 2020 08/05/22 2358 08/06/22 0359  GLUCAP 150* 139* 132*        8: Hypertension: monitor TID and prn             -continue amlodipine 10 mg daily             -losartan 50 mg daily -> Dced 2/3 d/t concern for angioedema               - Add hydralazine 10 mg PRN for SBP >180, DBP>100    08/06/2022    5:00 AM 08/06/2022    3:49 AM 08/05/2022    7:51 PM  Vitals with BMI  Weight 209 lbs    BMI 37.03    Systolic  147 152  Diastolic  81 82  Pulse  91 95   2/4: BP stable/high; mild tachycardia this AM; if AM labs do not show dehydration, would start HCTZ 12.5 mg  daily for adjunctive control  2/5 BP with improvement over last 12 hours--will hold off on adding any further medication right now.  2/6 - mild HTN, no med changes, labs in AM  2/7 -   HTN remains mild; start hctz 6.325 mg per tube daily; labs in AM 2/9 9: Hyperlipidemia: continue statin, Zetia   10: SDH/ICH s/p crani: Finished Keppra for sz ppx.    11: Leukocytosis: current temperature 99.3, ceftazidime completed 1/29             -Follow-up CBC with differential, mild WBC increase 2.2 likely d/t POD#1; downtrending 2/3               - Fever overnight prior to admission; POD from PEG, monitor for recurrence  - No fever recurrence 2/3; monitor  - 2/6: Labs in AM. CXR today for course lung sounds, and duplex for LUE swelling. Strict oral care QID    - 2/7: AM labs with mild subsegmental atelectasis on R; encourage ISB 12: Anemia, multifactorial:               -Follow-up CBC -hgb with sl decrease again today, normocytic  -  check stool for OB  -PA added b-supp. Add Fe++ too   13. ?Angioedema vs. Chlorhexadine reaction - improving             - Improving per family after DC chlorhexidine skin wipes and oral mouthwash              - Per SLP notes, interferes with tongue movement, speaking             - Hx cough with Enalipril, Losartan increased from OP 25 mg to 50 mg 1/14, never weaned; edema started 1/30 per chart review             - d/w pharmacy 2/4 and agree that reaction can occur at any time, and may cross-react with ARB. Will attempt to titrate BP meds without ACE/ARBs and monitor swelling.   14. L hemineglect - causing R gaze preference and positional tone             - Repositioning in bed to straighten head--head more neutral today 2/5             - May need to consider botox given need to recover cognitive function and relatively little tone elsewhere  - 2/6: E stim and taping to L shoulder for mild sublux per OT.   15. Urinary incontinence  - Foley removed 2/6  - PVRs QID and  ISC for >350 ccs retention  - 2/7: 1x PVR low; monitor; ? Incontinence vs mixed, will consider timed toiletting after more information gathered  60. Dysphagia, s/p PEG placement.   - Remains NPO  - Strict oral care QID   - CXR today given course breath sounds, weak cough, poor secretion clearance - R subsegmental atelectatis, encourage ISB  17. Diarrhea  - loose stools likely d/t tube feeds; on fibercon; will add immodium for >2 diarrhea BM in 24 hours  LOS: 4 days A FACE TO [place] 08/06/2022, 10:50 AM

## 2022-08-06 NOTE — Progress Notes (Signed)
Attempted to meet with patient. Family not in room.

## 2022-08-07 DIAGNOSIS — E441 Mild protein-calorie malnutrition: Secondary | ICD-10-CM | POA: Insufficient documentation

## 2022-08-07 LAB — GLUCOSE, CAPILLARY
Glucose-Capillary: 139 mg/dL — ABNORMAL HIGH (ref 70–99)
Glucose-Capillary: 162 mg/dL — ABNORMAL HIGH (ref 70–99)
Glucose-Capillary: 147 mg/dL — ABNORMAL HIGH (ref 70–99)
Glucose-Capillary: 178 mg/dL — ABNORMAL HIGH (ref 70–99)
Glucose-Capillary: 144 mg/dL — ABNORMAL HIGH (ref 70–99)

## 2022-08-07 NOTE — Progress Notes (Signed)
Physical Therapy Session Note  Patient Details  Name: Gina Kelley MRN: [id number] Date of Birth: 1935/08/19  Today's Date: 08/07/2022 PT Individual Time: 1505-1559 PT Individual Time Calculation (min): 54 min   Short Term Goals: Week 1:  PT Short Term Goal 1 (Week 1): Pt will roll with max A or less PT Short Term Goal 2 (Week 1): Pt will perform supine to sit with max A PT Short Term Goal 3 (Week 1): Pt will require min A for static sitting balance  Skilled Therapeutic Interventions/Progress Updates:     Pt received seated in tilt in space WC and agrees to therapy. No complaint of pain. WC transport to gym. Pt positioned with WC in standing frame and PT places sling under hips. Sit to stand initiated and pt noted to be soiled with feces. Pt returns to sitting and WC transport back to room. Slideboard transfer to bed with totalA and cues for anterior weight shift, sequencing, and hand position. Sit to supine with dependent +2 assistance. Pt performs bilateral rolling with maxA to [place], with cues for body mechanics and hand and leg position to facilitate efficient movement and provide mobility training for pt. Pt has large amount of liquid diarrhea in brief, requiring multiple rolls to perform anterior and posterior pericare. Pt left semi reclined in bed with call bell in reach.   Therapy Documentation Precautions:  Precautions Precautions: Fall Precaution Comments: peg tube. HOB no lower than 30*. Left dense hemi. right gaze preference, left side inattention Required Braces or Orthoses: Other Brace Other Brace: PRAFO boots on in bed Restrictions Weight Bearing Restrictions: No   Therapy/Group: Individual Therapy  Breck Coons, PT, DPT 08/07/2022, 5:01 PM

## 2022-08-07 NOTE — Progress Notes (Signed)
Occupational Therapy Session Note  Patient Details  Name: STORY CONTI MRN: [id number] Date of Birth: 17-Jul-1935  Today's Date: 08/07/2022 OT Individual Time: 7829-5621 OT Individual Time Calculation (min): 55 min    Short Term Goals: Week 1:  OT Short Term Goal 1 (Week 1): Pt will complete UB bathing with Max A seated on EOB or at sink. OT Short Term Goal 2 (Week 1): Pt will complete LB bating with Max A seated on EOB or at sink. OT Short Term Goal 3 (Week 1): Pt will increase bed mobility while transitioning to seated on EOB from supine with Max A. OT Short Term Goal 4 (Week 1): Pt will increase static sitting balance to [employer] while completing functional self care task. OT Short Term Goal 5 (Week 1): Pt will increase attention to her immediate environment by locating all items needed for grooming task when placed to her left.  Skilled Therapeutic Interventions/Progress Updates:    Pt greeted semi-reclined in bed awake. Pt incontinent of urine. Rolling in bed with max A +2 for total A peri-care and brief change. Total A to thread pants then max +2 to roll and get them up. Max A to get to sitting EOB. Worked on sitting balance at EOB within UB bathing/dressing tasks from min to mod A. Max A for UB bathing and dressing. OT placed large Ace wrap around abdomen to cover PEG tube instead of abdominal binder. Pt  then complete slideboard transfer with max A +2. Nursing administered meds via PEG tube. OT applied Kinesiotape to L shoulder for shoulder support.  OT placed SAEBO e-stim to wrist extensors. SAEBO left on for 60 minutes. OT returned to remove SAEBO with skin intact and no adverse reactions.  [place] Stim One 330 pulse width 35 Hz pulse rate On 8 sec/ off 8 sec Ramp up/ down 2 sec Symmetrical Biphasic wave form  Max intensity 174mA at 500 Ohm load  Pt left seated in TIS wc with alarm belt on, call bell in reach, and needs met.   Therapy Documentation Precautions:   Precautions Precautions: Fall Precaution Comments: peg tube. HOB no lower than 30*. Left dense hemi. right gaze preference, left side inattention Required Braces or Orthoses: Other Brace Other Brace: PRAFO boots on in bed Restrictions Weight Bearing Restrictions: No  Pain:  Denies pain   Therapy/Group: Individual Therapy  Valma Cava 08/07/2022, 9:47 AM

## 2022-08-07 NOTE — Progress Notes (Signed)
Speech Language Pathology Daily Session Note  Patient Details  Name: Gina Kelley MRN: [id number] Date of Birth: 07/31/1935  Today's Date: 08/07/2022 SLP Individual Time: 2683-4196 SLP Individual Time Calculation (min): 45 min  Short Term Goals: Week 1: SLP Short Term Goal 1 (Week 1): Pt will consume therapeutic trials of POs (water or nectar thick liquids) with max assist to contain boluses orally and initiate swallow. SLP Short Term Goal 2 (Week 1): Pt will locate objects at midline during functional tasks in 50% of opportunities with max assist multimodal cues. SLP Short Term Goal 3 (Week 1): Pt will use overarticulation and increased vocal intensity to achieve intelligibility at the single phoneme level with max assist multimodal cues. SLP Short Term Goal 4 (Week 1): Pt will sustain her attention to functional tasks for ~3 minute intervals with max cues for redirection. SLP Short Term Goal 5 (Week 1): Pt will communicate basic, immediate wants and needs via multiple modalities with max assist multimodal cues.  Skilled Therapeutic Interventions: Skilled treatment session focused on communication and dysphagia goals. Upon arrival, patient was awake while upright in the wheelchair. SLP provided extensive and thorough oral care in order to remove dried secretions from patient's tongue and hard palate. Patient tolerated wet and dry swabs with moderate verbal cues needed for patient to close her oral cavity to initiate a swallow response. Patient also appeared to demonstrate a delay in swallow response. No overt s/s of aspiration noted. Recommend patient remain NPO with ongoing trials with SLP.  Patient appeared brighter today with increased verbal expression. Patient verbalizing spontaneously at the phrase level and perseverative on knowing this SLPs mother. When a communication breakdown did occur, SLP provided encouragement for use of written expression in which patient was able to utilize with  Mod verbal cues.   Patient left upright in wheelchair with alarm on and all needs within reach. Continue with current plan of care.       Pain No/Denies Pain   Therapy/Group: Individual Therapy  Louay Myrie 08/07/2022, 3:02 PM

## 2022-08-07 NOTE — Progress Notes (Signed)
Physical Therapy Session Note  Patient Details  Name: Gina Kelley MRN: [id number] Date of Birth: [birth year]  Today's Date: 08/07/2022 PT Individual Time: 0930-1015 PT Individual Time Calculation (min): 45 min   Short Term Goals: Week 1:  PT Short Term Goal 1 (Week 1): Pt will roll with max A or less PT Short Term Goal 2 (Week 1): Pt will perform supine to sit with max A PT Short Term Goal 3 (Week 1): Pt will require min A for static sitting balance  Skilled Therapeutic Interventions/Progress Updates:  Patient greeted reclined in TIS wheelchair in room and agreeable to PT treatment session. Feeding tube paused and disconnected for treatment session and then re-connected and turned on once patient was returned to the room. Patient wheeled to rehab gym for time management.   Patient tasked with standing in the hallway with R handrail and total assist with rehab tech present to assist with facilitating trunk and head extension. Therapist placed a sheet around patient's hips and therapist's hips in order to improve overall hip extension for improved posturing. Mirror placed in front of patient for external visual cues regarding posturing. Patient tolerated standing ~1 minute prior to requiring a seated rest break. Attempted a second stand toward end of treatment session, however was unsuccessful secondary to notable fatigue and drastic L lateral lean.   In between sit/stand attempts, spent time working on sitting balance- Patient tasked with shifting her weight to the R in order to have her shoulder touching the hand-rail for external cues. Patient was able to initiate a minor shift to the R, however not enough to where her shoulder was touching the rail. Patient required facilitation and overpressure to fully shift her weight to the R with patient eventually pushing against therapist despite education and VC. Therapist attempted multimodal cues for improved R lateral weight shift with minimal to  no improvements in patient's initiation/motor planning.   Patient reported she wanted to stay sitting upright in her wheelchair and not transfer back to the bed at end of treatment session. Patient left sitting reclined in TIS wheelchair with belt on, posey belt on, call bell within reach and all needs met.    Therapy Documentation Precautions:  Precautions Precautions: Fall Precaution Comments: peg tube. HOB no lower than 30*. Left dense hemi. right gaze preference, left side inattention Required Braces or Orthoses: Other Brace Other Brace: PRAFO boots on in bed Restrictions Weight Bearing Restrictions: No   Therapy/Group: Individual Therapy  Rabecca Birge 08/07/2022, 8:00 AM

## 2022-08-07 NOTE — Progress Notes (Signed)
PROGRESS NOTE   Subjective/Complaints:  No events overnight. Indicated no pain in arm this AM, is hurting around PEG site. Says "your hands are cold!"  ROS: limited due to language/communication   Objective:   VAS [place] UPPER EXTREMITY VENOUS DUPLEX  Result Date: 08/06/2022 UPPER VENOUS STUDY  Patient Name:  Gina Kelley  Date of Exam:   08/06/2022 Medical Rec #: [id number]          Accession #:    [id number] Date of Birth: [birth year]          Patient Gender: F Patient Age:   87 years Exam Location:  [facility] Procedure:      VAS [place] UPPER EXTREMITY VENOUS DUPLEX Referring Phys: Elijah Birk --------------------------------------------------------------------------------  Indications: Left arm swelling, immobile Comparison Study: No prior studies. Performing Technologist: Jean Rosenthal RDMS, RVT  Examination Guidelines: A complete evaluation includes B-mode imaging, spectral Doppler, color Doppler, and power Doppler as needed of all accessible portions of each vessel. Bilateral testing is considered an integral part of a complete examination. Limited examinations for reoccurring indications may be performed as noted.  Right Findings: +----------+------------+---------+-----------+----------+---------------------+ RIGHT     CompressiblePhasicitySpontaneousProperties       Summary        +----------+------------+---------+-----------+----------+---------------------+ Subclavian                                                Unable to                                                           insonate--right sided                                                       lean with support                                                              pillow         +----------+------------+---------+-----------+----------+---------------------+  Left Findings:  +----------+------------+---------+-----------+----------+-----------------+ LEFT      CompressiblePhasicitySpontaneousProperties     Summary      +----------+------------+---------+-----------+----------+-----------------+ IJV           Full       Yes       Yes                                +----------+------------+---------+-----------+----------+-----------------+ Subclavian  Yes       Yes                                +----------+------------+---------+-----------+----------+-----------------+ Axillary      Full       Yes       Yes                                +----------+------------+---------+-----------+----------+-----------------+ Brachial      Full                                                    +----------+------------+---------+-----------+----------+-----------------+ Radial        Full                                                    +----------+------------+---------+-----------+----------+-----------------+ Ulnar         Full                                                    +----------+------------+---------+-----------+----------+-----------------+ Cephalic    Partial      Yes       Yes              Age Indeterminate +----------+------------+---------+-----------+----------+-----------------+ Basilic       Full                                                    +----------+------------+---------+-----------+----------+-----------------+  Summary:  Left: No evidence of deep vein thrombosis in the upper extremity. Findings consistent with age indeterminate superficial vein thrombosis involving the left cephalic vein.  *See table(s) above for measurements and observations.  Diagnosing physician: Harold Barban MD Electronically signed by Harold Barban MD on 08/06/2022 at 10:05:26 PM.    Final    DG CHEST PORT 1 VIEW  Result Date: 08/05/2022 CLINICAL DATA:  Aspiration EXAM: PORTABLE CHEST 1 VIEW COMPARISON:   07/14/2022 FINDINGS: Subsegmental atelectasis identified right base. Right apex is obscured by overlapping structures. Left lung grossly clear. Calcified aorta. Thoracic degenerative changes. Unremarkable cardiac silhouette. IMPRESSION: Right base subsegmental atelectasis.  Study limited by positioning. Electronically Signed   By: Sammie Bench M.D.   On: 08/05/2022 15:12   Recent Labs    08/05/22 0650 08/06/22 0723  WBC 7.8 7.0  HGB 7.4* 7.1*  HCT 23.6* 22.5*  PLT 405* 344    Recent Labs    08/06/22 0723  NA 137  K 3.8  CL 97*  CO2 31  GLUCOSE 145*  BUN 38*  CREATININE 0.64  CALCIUM 8.4*     Intake/Output Summary (Last 24 hours) at 08/07/2022 1453 Last data filed at 08/06/2022 1614 Gross per 24 hour  Intake 507.83 ml  Output --  Net 507.83 ml  Physical Exam: Vital Signs Blood pressure (!) 145/61, pulse 91, temperature 98.4 F (36.9 C), temperature source Oral, resp. rate 20, height 5\' 3"  (1.6 m), weight 93.9 kg, SpO2 (!) 62 %.  Constitutional: No distress . Vital signs reviewed. HEENT: NCAT, lip swelling, particular lower - improved. Tongue slightly extruded.  Neck: supple, forward flexed.  Cardiovascular: RRR without murmur. No JVD    Respiratory/Chest: Mild course lung sounds b.l.  Normal effort.  GI/Abdomen: BS +, non-tender, +PEG Ext: no clubbing, cyanosis. LUE 2+ Edema Psych: pleasant and cooperative  Skin: C/D/I. No apparent lesions. +LUE IV, c/d/I. +Peg, mild serous drainage, c/d/i  + Sacral wound  - scabbed  MSK:      1/2 fingerbreadth subluxation L shoulder      Strength: unchanged 2/6                LUE: Flaccid                 LLE:  4-/5 proximal, 3/5 distal   Neurologic exam:  Cognition: AAO to person, place; time with options; alert. Follows simple commands, reacts to verbal input. Improving dysarthric verbalizations. communicates mostly with gestures/hands, 100% intelligability. Follows directions Insight: Fair insight into current  condition. Knows she had a stroke.   + LUE Hoffman's; +L facial droop. + eyes dont deviate past midline L. + L shoulder weakness--no change Spasticity: mild tone in neck,-- much improved!  BL foot clonus with intention.   Assessment/Plan: 1. Functional deficits which require 3+ hours per day of interdisciplinary therapy in a comprehensive inpatient rehab setting. Physiatrist is providing close team supervision and 24 hour management of active medical problems listed below. Physiatrist and rehab team continue to assess barriers to discharge/monitor patient progress toward functional and medical goals  Care Tool:  Bathing    Body parts bathed by patient: Face   Body parts bathed by helper: Left lower leg, Right lower leg, Left upper leg, Right upper leg, Buttocks, Front perineal area, Abdomen, Chest, Left arm, Right arm     Bathing assist Assist Level: Total Assistance - Patient < 25%     Upper Body Dressing/Undressing Upper body dressing   What is the patient wearing?: Pull over shirt    Upper body assist Assist Level: Total Assistance - Patient < 25%    Lower Body Dressing/Undressing Lower body dressing      What is the patient wearing?: Pants, Incontinence brief     Lower body assist Assist for lower body dressing: Dependent - Patient 0%     Toileting Toileting    Toileting assist Assist for toileting: Dependent - Patient 0%     Transfers Chair/bed transfer  Transfers assist  Chair/bed transfer activity did not occur: Safety/medical concerns (unable 2/2 pain, fatigue, weakness)  Chair/bed transfer assist level: 2 Helpers     Locomotion Ambulation   Ambulation assist   Ambulation activity did not occur: Safety/medical concerns (unable 2/2 pain, fatigue, weakness)          Walk 10 feet activity   Assist  Walk 10 feet activity did not occur: Safety/medical concerns        Walk 50 feet activity   Assist Walk 50 feet with 2 turns activity did  not occur: Safety/medical concerns         Walk 150 feet activity   Assist Walk 150 feet activity did not occur: Safety/medical concerns         Walk 10 feet on uneven surface  activity  Assist Walk 10 feet on uneven surfaces activity did not occur: Safety/medical concerns         Wheelchair     Assist Is the patient using a wheelchair?: Yes Type of Wheelchair: Manual Wheelchair activity did not occur: Safety/medical concerns (unable 2/2 pain, fatigue, weakness)    Max wheelchair distance: 0    Wheelchair 50 feet with 2 turns activity    Assist    Wheelchair 50 feet with 2 turns activity did not occur: Safety/medical concerns       Wheelchair 150 feet activity     Assist  Wheelchair 150 feet activity did not occur: Safety/medical concerns       Blood pressure (!) 145/61, pulse 91, temperature 98.4 F (36.9 C), temperature source Oral, resp. rate 20, height 5\' 3"  (1.6 m), weight 93.9 kg, SpO2 (!) 62 %.    Medical Problem List and Plan: 1. Functional deficits secondary to subdural hematoma and ICH             - patient may shower with PEG site covered             - ELOS/Goals: 14-16 days, mod assist PT, mod assist OT, mod assist SLP  - goal DC 2/28             - Does well communicating with writing board and fingers for y/n             - Family requests change from DNR to full code on admission; changed   - Continue CIR therapies including PT, OT, and SLP    - 2/6 ordered L PRAFO, WHO, and soft cervical collar to help with head positioning OOB  2.  Antithrombotics: -DVT/anticoagulation:  Mechanical:  Antiembolism stockings, knee (TED hose) Bilateral lower extremities             -antiplatelet therapy: none   - Duplex LUE pending - 2/8 superficial vein thrombosis involving the left cephalic vein-- warm compresses and conservative treatment   3. Pain Management: Tylenol as needed    - 2/6: lidocaine patch to L shoulder for pain -  benefitting   4. Mood/Behavior/Sleep: LCSW to evaluate and provide emotional support             -antipsychotic agents: n/a   5. Neuropsych/cognition: This patient is not capable of making decisions on her own behalf.   6. Skin/Wound Care: Routine skin care checks - coccyx PI with scab  - routine care - LAL mattress    7. Fluids/Electrolytes/Nutrition: Routine Is and Os and follow-up chemistries             -NPO; has PEG             -continue Glucerna 1.5 continuous Tfs              - Consult dietary to follow for titration - BG mildly elevated but in good range given age, monitor Recent Labs    08/07/22 0337 08/07/22 0817 08/07/22 1142  GLUCAP 139* 162* 147*        8: Hypertension: monitor TID and prn             -continue amlodipine 10 mg daily             -losartan 50 mg daily -> Dced 2/3 d/t concern for angioedema               - Add hydralazine 10 mg PRN for SBP >180, DBP>100    08/07/2022  1:23 PM 08/07/2022    3:59 AM 08/07/2022    3:42 AM  Vitals with BMI  Weight  207 lbs   BMI  50.09   Systolic 381  829  Diastolic 61  71  Pulse 91  93   2/4: BP stable/high; mild tachycardia this AM; if AM labs do not show dehydration, would start HCTZ 12.5 mg daily for adjunctive control  2/5 BP with improvement over last 12 hours--will hold off on adding any further medication right now.  2/6 - mild HTN, no med changes, labs in AM  2/7 -   HTN remains mild; start hctz 6.325 mg per tube daily; labs in AM 2/9  9: Hyperlipidemia: continue statin, Zetia   10: SDH/ICH s/p crani: Finished Keppra for sz ppx.    11: Leukocytosis: current temperature 99.3, ceftazidime completed 1/29             -Follow-up CBC with differential, mild WBC increase 2.2 likely d/t POD#1; downtrending 2/3               - Fever overnight prior to admission; POD from PEG, monitor for recurrence  - No fever recurrence 2/3; monitor  - 2/6: Labs in AM. CXR today for course lung sounds, and duplex for LUE  swelling. Strict oral care QID    - 2/7: AM labs with mild subsegmental atelectasis on R; encourage ISB  12: Anemia, multifactorial:               -Follow-up CBC -hgb with sl decrease again today, normocytic  -check stool for OB  -PA added b-supp. Add Fe++ too   13. ?Angioedema vs. Chlorhexadine reaction - improving             - Improving per family after DC chlorhexidine skin wipes and oral mouthwash              - Per SLP notes, interferes with tongue movement, speaking             - Hx cough with Enalipril, Losartan increased from OP 25 mg to 50 mg 1/14, never weaned; edema started 1/30 per chart review             - d/w pharmacy 2/4 and agree that reaction can occur at any time, and may cross-react with ARB. Will attempt to titrate BP meds without ACE/ARBs and monitor swelling.   14. L hemineglect - causing R gaze preference and positional tone             - Repositioning in bed to straighten head--head more neutral today 2/5             - May need to consider botox given need to recover cognitive function and relatively little tone elsewhere  - 2/6: E stim and taping to L shoulder for mild sublux per OT.   15. Urinary incontinence  - Foley removed 2/6  - PVRs QID and ISC for >350 ccs retention  - 2/7: 1x PVR low; monitor; ? Incontinence vs mixed, will consider timed toiletting after more information gathered  89. Dysphagia, s/p PEG placement.   - Remains NPO  - Strict oral care QID   - CXR today given course breath sounds, weak cough, poor secretion clearance - R subsegmental atelectatis, encourage ISB  17. Diarrhea  - loose stools likely d/t tube feeds; on fibercon; will add immodium for >2 diarrhea BM in 24 hours  LOS: 5 days A FACE TO FACE EVALUATION WAS PERFORMED  Angelina Sheriff 08/07/2022, 2:53 PM

## 2022-08-08 LAB — GLUCOSE, CAPILLARY
Glucose-Capillary: 115 mg/dL — ABNORMAL HIGH (ref 70–99)
Glucose-Capillary: 136 mg/dL — ABNORMAL HIGH (ref 70–99)
Glucose-Capillary: 141 mg/dL — ABNORMAL HIGH (ref 70–99)
Glucose-Capillary: 156 mg/dL — ABNORMAL HIGH (ref 70–99)
Glucose-Capillary: 158 mg/dL — ABNORMAL HIGH (ref 70–99)
Glucose-Capillary: 158 mg/dL — ABNORMAL HIGH (ref 70–99)

## 2022-08-08 LAB — BASIC METABOLIC PANEL
Anion gap: 10 (ref 5–15)
BUN: 38 mg/dL — ABNORMAL HIGH (ref 8–23)
CO2: 33 mmol/L — ABNORMAL HIGH (ref 22–32)
Calcium: 8.6 mg/dL — ABNORMAL LOW (ref 8.9–10.3)
Chloride: 92 mmol/L — ABNORMAL LOW (ref 98–111)
Creatinine, Ser: 0.55 mg/dL (ref 0.44–1.00)
GFR, Estimated: >60 mL/min (ref >60)
Glucose, Bld: 131 mg/dL — ABNORMAL HIGH (ref 70–99)
Potassium: 3.2 mmol/L — ABNORMAL LOW (ref 3.5–5.1)
Sodium: 135 mmol/L (ref 135–145)

## 2022-08-08 LAB — HEMOGLOBIN AND HEMATOCRIT, BLOOD
HCT: 21.4 % — ABNORMAL LOW (ref 36.0–46.0)
HCT: 24.4 % — ABNORMAL LOW (ref 36.0–46.0)
Hemoglobin: 6.4 g/dL — CL (ref 12.0–15.0)
Hemoglobin: 7.9 g/dL — ABNORMAL LOW (ref 12.0–15.0)

## 2022-08-08 LAB — PREPARE RBC (CROSSMATCH)

## 2022-08-08 MED ORDER — SODIUM CHLORIDE 0.9% IV SOLUTION: Freq: Once | INTRAVENOUS | Status: AC

## 2022-08-08 MED ORDER — CARVEDILOL 3.125 MG PO TABS
3.1250 mg | ORAL_TABLET | Freq: Two times a day (BID) | ORAL | Status: DC
  Administered 2022-08-08 – 2022-08-12 (×8): 3.125 mg
  Filled 2022-08-08 (×8): qty 1

## 2022-08-08 MED ORDER — POTASSIUM CHLORIDE 20 MEQ PO PACK: 40.0000 meq | PACK | Freq: Two times a day (BID) | ORAL | Status: DC

## 2022-08-08 MED ORDER — HYDROCHLOROTHIAZIDE 12.5 MG PO TABS: 12.5000 mg | ORAL_TABLET | Freq: Every day | ORAL | Status: DC

## 2022-08-08 NOTE — Progress Notes (Signed)
Lab notified writer that hemoglobin level for patient was at a critical level, 6.4. Charge Nurse made aware for nights and day. Day nurse made aware. On call provider called and made aware. Voicemail was left with on call provider waiting for return call back. Made aware of Provider Dan A. on the unit of critical lab value. Verbal orders were obtained by provider. Clarified with the Provider about holding patient blood thinner medications and day nurse made aware Provider wanting to hold these medications. Report given to day nurse Jorene Guest RN.

## 2022-08-08 NOTE — Progress Notes (Signed)
Physical Therapy Session Note  Patient Details  Name: Gina Kelley MRN: [id number] Date of Birth: 21-Jan-1936  Today's Date: 08/08/2022 PT Individual Time: 1415-1505 PT Individual Time Calculation (min): 50 min  and Today's Date: 08/08/2022 PT Missed Time: 25 Minutes Missed Time Reason: Other (Comment) (Blood transfusion)  Short Term Goals: Week 1:  PT Short Term Goal 1 (Week 1): Pt will roll with max A or less PT Short Term Goal 2 (Week 1): Pt will perform supine to sit with max A PT Short Term Goal 3 (Week 1): Pt will require min A for static sitting balance  Skilled Therapeutic Interventions/Progress Updates:  Patient greeted sitting reclined in TIS wheelchair and agreeable to PT treatment session- Patient currently receiving blood transfusion, but noted to be soiled. Feeding tube pause prior to transferring patient back to bed. Patient performed squat pivot transfer from wheelchair to sitting EOB with MaxA x2. While sitting EOB, patient required MinA for sitting balance. Patient was able to transition on R forearm with MinA and multimodal cues for full weight shift onto forearm. Patient then transitioned to supine to MaxA x2- One therapist to manage trunk and other therapist to manage B LE. Patient rolled L and R several times with MaxA for pericare and donning new brief as patient had significant incontinent BM and void. Patient left supine in bed (at 30 degrees) with B heels floated, L UE propped on pillow and call bells within reach. Feeding tube re-started and RN notified of blood transfusion line being occluded.    Therapy Documentation Precautions:  Precautions Precautions: Fall Precaution Comments: peg tube. HOB no lower than 30*. Left dense hemi. right gaze preference, left side inattention Required Braces or Orthoses: Other Brace Other Brace: PRAFO boots on in bed Restrictions Weight Bearing Restrictions: No   Therapy/Group: Individual Therapy  Prapti Grussing 08/08/2022,  7:59 AM

## 2022-08-08 NOTE — Progress Notes (Signed)
Occupational Therapy Session Note  Patient Details  Name: Gina Kelley MRN: [id number] Date of Birth: 18-Jan-1936  Today's Date: 08/08/2022 OT Individual Time: [id number] Individual Time Calculation (min): 71 min    Short Term Goals: Week 1:  OT Short Term Goal 1 (Week 1): Pt will complete UB bathing with Max A seated on EOB or at sink. OT Short Term Goal 2 (Week 1): Pt will complete LB bating with Max A seated on EOB or at sink. OT Short Term Goal 3 (Week 1): Pt will increase bed mobility while transitioning to seated on EOB from supine with Max A. OT Short Term Goal 4 (Week 1): Pt will increase static sitting balance to [employer] while completing functional self care task. OT Short Term Goal 5 (Week 1): Pt will increase attention to her immediate environment by locating all items needed for grooming task when placed to her left.  Skilled Therapeutic Interventions/Progress Updates:    Pt greeted semi-reclined in bed asleep, easy to wake and agreeable to OT treatment session. Nurse tech entered as +2 per OT request.  Total A +2 for peri-care and brief change due to large loose BM and bladder incontinence. Total A to don pants at bed level. Max A to get to sitting EOB. Pt with improved sitting balance with longer bouts of supervision. Stedy used for sit<>stand with max A +2 and difficulty achieving full upright hips and trunk. +2 to transfer her to TIS wc. UB bathing/dressing completed at the sink with cues for initiation and thoroughness. Pt initiated washing her face, but would not initiate washing other parts of her body. UB dressing from wc with max A. Gentle stretching and ROM to L UE with eductaion provided for positioning. OT put on patient preferred gospel music and pt attempted to sing along to some of the lyrics. Pt left seated in TIS wc with L UE supported, seat belt and alarm belt on, and soft touch clal light in lap.  Therapy Documentation Precautions:  Precautions Precautions:  Fall Precaution Comments: peg tube. HOB no lower than 30*. Left dense hemi. right gaze preference, left side inattention Required Braces or Orthoses: Other Brace Other Brace: PRAFO boots on in bed Restrictions Weight Bearing Restrictions: No  Pain:  Denies pain   Therapy/Group: Individual Therapy  Valma Cava 08/08/2022, 1:00 PM

## 2022-08-08 NOTE — Progress Notes (Signed)
PROGRESS NOTE   Subjective/Complaints:  This AM, HgB 6.4, no apparent bloody stools or other sources of bleeding. Patient endorses she is feeling ok, rested well. Does endorse pain in her PEG site, L shoulder and R>L knee today.   ROS: limited due to language/communication + PEG, L shoulder, bilateral knee pain - ongoing  Objective:   VAS [place] UPPER EXTREMITY VENOUS DUPLEX  Result Date: 08/06/2022 UPPER VENOUS STUDY  Patient Name:  Gina Kelley  Date of Exam:   08/06/2022 Medical Rec #: [id number]          Accession #:    [id number] Date of Birth: [birth year]          Patient Gender: F Patient Age:   87 years Exam Location:  [facility] Procedure:      VAS [place] UPPER EXTREMITY VENOUS DUPLEX Referring Phys: Durel Salts --------------------------------------------------------------------------------  Indications: Left arm swelling, immobile Comparison Study: No prior studies. Performing Technologist: Darlin Coco RDMS, RVT  Examination Guidelines: A complete evaluation includes B-mode imaging, spectral Doppler, color Doppler, and power Doppler as needed of all accessible portions of each vessel. Bilateral testing is considered an integral part of a complete examination. Limited examinations for reoccurring indications may be performed as noted.  Right Findings: +----------+------------+---------+-----------+----------+---------------------+ RIGHT     CompressiblePhasicitySpontaneousProperties       Summary        +----------+------------+---------+-----------+----------+---------------------+ Subclavian                                                Unable to                                                           insonate--right sided                                                       lean with support                                                              pillow          +----------+------------+---------+-----------+----------+---------------------+  Left Findings: +----------+------------+---------+-----------+----------+-----------------+ LEFT      CompressiblePhasicitySpontaneousProperties     Summary      +----------+------------+---------+-----------+----------+-----------------+ IJV           Full       Yes       Yes                                +----------+------------+---------+-----------+----------+-----------------+  Subclavian               Yes       Yes                                +----------+------------+---------+-----------+----------+-----------------+ Axillary      Full       Yes       Yes                                +----------+------------+---------+-----------+----------+-----------------+ Brachial      Full                                                    +----------+------------+---------+-----------+----------+-----------------+ Radial        Full                                                    +----------+------------+---------+-----------+----------+-----------------+ Ulnar         Full                                                    +----------+------------+---------+-----------+----------+-----------------+ Cephalic    Partial      Yes       Yes              Age Indeterminate +----------+------------+---------+-----------+----------+-----------------+ Basilic       Full                                                    +----------+------------+---------+-----------+----------+-----------------+  Summary:  Left: No evidence of deep vein thrombosis in the upper extremity. Findings consistent with age indeterminate superficial vein thrombosis involving the left cephalic vein.  *See table(s) above for measurements and observations.  Diagnosing physician: Harold Barban MD Electronically signed by Harold Barban MD on 08/06/2022 at 10:05:26 PM.    Final    Recent Labs     08/06/22 0723 08/08/22 0633  WBC 7.0  --   HGB 7.1* 6.4*  HCT 22.5* 21.4*  PLT 344  --     Recent Labs    08/06/22 0723 08/08/22 0633  NA 137 135  K 3.8 3.2*  CL 97* 92*  CO2 31 33*  GLUCOSE 145* 131*  BUN 38* 38*  CREATININE 0.64 0.55  CALCIUM 8.4* 8.6*     Intake/Output Summary (Last 24 hours) at 08/08/2022 1203 Last data filed at 08/08/2022 0456 Gross per 24 hour  Intake 1984.58 ml  Output --  Net 1984.58 ml          Physical Exam: Vital Signs Blood pressure (!) 144/64, pulse 91, temperature 98.7 F (37.1 C), temperature source Oral, resp. rate 17, height 5' 3"$  (1.6 m), weight 94.8 kg, SpO2 96 %.  Constitutional: No distress . Vital signs reviewed. HEENT:  NCAT, lip swelling, particular lower - improved. Tongue slightly extruded.  Neck: supple, forward flexed.  Cardiovascular: RRR without murmur. No JVD    Respiratory/Chest: CTAB. Normal effort.  GI/Abdomen: BS +, non-tender to all 4 quadrants, +PEG  Ext: no clubbing, cyanosis. LUE 2+ Edema - unchanged Psych: pleasant and cooperative  Skin: C/D/I. No apparent lesions. +LUE IV, c/d/I. +Peg, mild serous drainage and slough around tube site  + Sacral wound  - scabbed  MSK:      1/2 fingerbreadth subluxation L shoulder      Strength: unchanged 2/6                LUE: Flaccid                 LLE:  4-/5 proximal, 3/5 distal   Neurologic exam:  Cognition: AAO to person, place; time with options; alert. Follows simple commands, reacts to verbal input. Improving dysarthric verbalizations. communicates mostly with gestures/hands, 100% intelligability. Follows directions Insight: Fair insight into current condition. Knows she had a stroke.   + LUE Hoffman's; +L facial droop. + eyes dont deviate past midline L. + L shoulder weakness--no change Spasticity: mild tone in neck,-- much improved!  BL foot clonus with intention.   Assessment/Plan: 1. Functional deficits which require 3+ hours per day of  interdisciplinary therapy in a comprehensive inpatient rehab setting. Physiatrist is providing close team supervision and 24 hour management of active medical problems listed below. Physiatrist and rehab team continue to assess barriers to discharge/monitor patient progress toward functional and medical goals  Care Tool:  Bathing    Body parts bathed by patient: Face   Body parts bathed by helper: Left lower leg, Right lower leg, Left upper leg, Right upper leg, Buttocks, Front perineal area, Abdomen, Chest, Left arm, Right arm     Bathing assist Assist Level: Total Assistance - Patient < 25%     Upper Body Dressing/Undressing Upper body dressing   What is the patient wearing?: Pull over shirt    Upper body assist Assist Level: Total Assistance - Patient < 25%    Lower Body Dressing/Undressing Lower body dressing      What is the patient wearing?: Pants, Incontinence brief     Lower body assist Assist for lower body dressing: Dependent - Patient 0%     Toileting Toileting    Toileting assist Assist for toileting: Dependent - Patient 0%     Transfers Chair/bed transfer  Transfers assist  Chair/bed transfer activity did not occur: Safety/medical concerns (unable 2/2 pain, fatigue, weakness)  Chair/bed transfer assist level: 2 Helpers     Locomotion Ambulation   Ambulation assist   Ambulation activity did not occur: Safety/medical concerns (unable 2/2 pain, fatigue, weakness)          Walk 10 feet activity   Assist  Walk 10 feet activity did not occur: Safety/medical concerns        Walk 50 feet activity   Assist Walk 50 feet with 2 turns activity did not occur: Safety/medical concerns         Walk 150 feet activity   Assist Walk 150 feet activity did not occur: Safety/medical concerns         Walk 10 feet on uneven surface  activity   Assist Walk 10 feet on uneven surfaces activity did not occur: Safety/medical concerns          Wheelchair     Assist Is the patient using a wheelchair?: Yes Type  of Wheelchair: Educational psychologist activity did not occur: Safety/medical concerns (unable 2/2 pain, fatigue, weakness)    Max wheelchair distance: 0    Wheelchair 50 feet with 2 turns activity    Assist    Wheelchair 50 feet with 2 turns activity did not occur: Safety/medical concerns       Wheelchair 150 feet activity     Assist  Wheelchair 150 feet activity did not occur: Safety/medical concerns       Blood pressure (!) 144/64, pulse 91, temperature 98.7 F (37.1 C), temperature source Oral, resp. rate 17, height 5' 3"$  (1.6 m), weight 94.8 kg, SpO2 96 %.    Medical Problem List and Plan: 1. Functional deficits secondary to subdural hematoma and ICH             - patient may shower with PEG site covered             - ELOS/Goals: 14-16 days, mod assist PT, mod assist OT, mod assist SLP  - goal DC 2/28             - Does well communicating with writing board and fingers for y/n             - Family requests change from DNR to full code on admission; changed   - Continue CIR therapies including PT, OT, and SLP    - 2/6 ordered L PRAFO, WHO, and soft cervical collar to help with head positioning OOB  2.  Antithrombotics: -DVT/anticoagulation:  Mechanical:  Antiembolism stockings, knee (TED hose) Bilateral lower extremities             -antiplatelet therapy: none   - Duplex LUE pending - 2/8 superficial vein thrombosis involving the left cephalic vein-- warm compresses and conservative treatment   3. Pain Management: Tylenol as needed    - 2/6: lidocaine patch to L shoulder for pain - benefitting    - 2/9: voltaren gel to L shoulder, b/l knees  4. Mood/Behavior/Sleep: LCSW to evaluate and provide emotional support             -antipsychotic agents: n/a   5. Neuropsych/cognition: This patient is not capable of making decisions on her own behalf.   6. Skin/Wound Care: Routine skin care  checks - coccyx PI with scab  - routine care - LAL mattress  - Dressings under PEG tube site to keep area dry, as appears moisture trapping under button 2/9   7. Fluids/Electrolytes/Nutrition: Routine Is and Os and follow-up chemistries             -NPO; has PEG             -continue Glucerna 1.5 continuous Tfs  - 2/9: Add potasium supplementation 20 meq BID; repeat labs Monday              - Consult dietary to follow for titration - BG mildly elevated but in good range given age, monitor Recent Labs    08/08/22 0443 08/08/22 0812 08/08/22 1150  GLUCAP 156* 115* 158*        8: Hypertension: monitor TID and prn             -continue amlodipine 10 mg daily             -losartan 50 mg daily -> Dced 2/3 d/t concern for angioedema               - Add hydralazine 10 mg PRN for SBP >180, DBP>100  08/08/2022   11:26 AM 08/08/2022   10:54 AM 08/08/2022    5:00 AM  Vitals with BMI  Weight   209 lbs  BMI   [id number]  Systolic [id number]   [id number] [id number]   Diastolic 64   64 70   Pulse 91   91 90    2/4: BP stable/high; mild tachycardia this AM; if AM labs do not show dehydration, would start HCTZ 12.5 mg daily for adjunctive control  2/5 BP with improvement over last 12 hours--will hold off on adding any further medication right now.  2/6 - mild HTN, no med changes, labs in AM  2/7 -   HTN remains mild; start hctz 6.325 mg per tube daily; labs in AM 2/9  2/9: increase HCTZ to 12.5 mg BID; add K 20 meq BID; repeat labs Monday  9: Hyperlipidemia: continue statin, Zetia   10: SDH/ICH s/p crani: Finished Keppra for sz ppx.    11: Leukocytosis: current temperature 99.3, ceftazidime completed 1/29             -Follow-up CBC with differential, mild WBC increase 2.2 likely d/t POD#1; downtrending 2/3               - Fever overnight prior to admission; POD from PEG, monitor for recurrence  - No fever recurrence 2/3; monitor  - 2/6: Labs in AM. CXR today for course lung sounds, and duplex for LUE swelling.  Strict oral care QID    - 2/7: AM labs with mild subsegmental atelectasis on R; encourage ISB  12: Anemia, multifactorial:               -Follow-up CBC -hgb with sl decrease again today, normocytic  -check stool for OB  -PA added b-supp. Add Fe++ too  - 2/9> transfuse 1 unit for HgB 6.4 today. FOBT pending.    13. ?Angioedema vs. Chlorhexadine reaction - improving             - Improving per family after DC chlorhexidine skin wipes and oral mouthwash              - Per SLP notes, interferes with tongue movement, speaking             - Hx cough with Enalipril, Losartan increased from OP 25 mg to 50 mg 1/14, never weaned; edema started 1/30 per chart review             - d/w pharmacy 2/4 and agree that reaction can occur at any time, and may cross-react with ARB. Will attempt to titrate BP meds without ACE/ARBs and monitor swelling.   14. L hemineglect - causing R gaze preference and positional tone             - Repositioning in bed to straighten head--head more neutral today 2/5             - May need to consider botox given need to recover cognitive function and relatively little tone elsewhere  - 2/6: E stim and taping to L shoulder for mild sublux per OT.   15. Urinary incontinence  - Foley removed 2/6  - PVRs QID and ISC for >350 ccs retention  - 2/7: 1x PVR low; monitor; ? Incontinence vs mixed, will consider timed toiletting after more information gathered  62. Dysphagia, s/p PEG placement.   - Remains NPO  - Strict oral care QID   - CXR today given course breath sounds, weak cough, poor secretion clearance -  R subsegmental atelectatis, encourage ISB  17. Diarrhea  - loose stools likely d/t tube feeds; on fibercon; will add immodium for >2 diarrhea BM in 24 hours   LOS: 6 days A FACE TO FACE EVALUATION WAS PERFORMED  Gertie Gowda 08/08/2022, 12:03 PM

## 2022-08-08 NOTE — Progress Notes (Signed)
Speech Language Pathology Daily Session Note  Patient Details  Name: Gina Kelley MRN: [id number] Date of Birth: February 05, 1936  Today's Date: 08/08/2022 SLP Individual Time: 1300-1330 SLP Individual Time Calculation (min): 30 min  Short Term Goals: Week 1: SLP Short Term Goal 1 (Week 1): Pt will consume therapeutic trials of POs with small ice chips with max assist to contain boluses orally and initiate swallow. SLP Short Term Goal 2 (Week 1): Pt will locate objects at midline during functional tasks in 50% of opportunities with max assist multimodal cues. SLP Short Term Goal 3 (Week 1): Pt will use overarticulation and increased vocal intensity to achieve intelligibility at the single phoneme level with max assist multimodal cues. SLP Short Term Goal 4 (Week 1): Pt will sustain her attention to functional tasks for ~3 minute intervals with max cues for redirection. SLP Short Term Goal 5 (Week 1): Pt will communicate basic, immediate wants and needs via multiple modalities with max assist multimodal cues.  Skilled Therapeutic Interventions: Pt seen this date for skilled ST intervention targeting swallowing and communication goals outlined in care plan. Pt received fatigued, though attempting to communicate some sign, CV words. Daughter present; however, left upon SLP arrival. Agreeable to intervention in hospital room. Pt receiving blood transfusion. Reporting abdominal pain with supportive communication interventions; RN notified. Oral care completed with Total A for thoroughness.   Specific emphasis placed on functional communication this session. Pt verbalized "yeah" and "no" accurately when asked simple yes/no questions re: personal and environmental information - benefited from Mod A, faded to Sup A verbal cues as task progressed. To facilitate pointing and selection of particular stimuli in hopes of shaping motoric movements for use of AAC tools, SLP provided opportunities for pt to select  correct words and letters when given written choices in field of 2 (vertical plane). Pt accurately selected 100% of letters given Total A (hand-over-hand prompts), faded to Sup A with mass practice and repetition. Selected requested word on ~80% of trials given Max A, faded to Min-Mod A. In upcoming session(s), will plan to continue verbalization of "yes" and "no" responses to answer functional yes/no questions and provide opportunities for pt to verbalize CV, VC, and/or CVC word combinations during functional and therapeutic tasks. Deferred therapeutic PO trials due to abdominal pain. Continue to recommend NPO.  Additionally, SLP assisted RN in communication attempts to determine level of pain given pt reporting pain (shook head yes to questions re: pain at onset of session); pt unable to point to number nor face on Wrong-Baker chart despite Max A multimodal cues. Did select "just started" when asked how long it had been going on given Mod A. Did appear tender to RN palpation. Pt attempting to respond to yes/no questions asked by RN with head nods/shakes with Sup A. Please note, pt missed 15 minutes of therapy due to pain and fatigue  Pt left in room and OOB in TIS with all safety measures activate, call bell reviewed and within reach, and all known needs met. Continue per current ST POC.   Pain Pain Assessment Pain Score: 3   Therapy/Group: Individual Therapy  Gina Kelley 08/08/2022, 4:27 PM

## 2022-08-09 DIAGNOSIS — D72829 Elevated white blood cell count, unspecified: Secondary | ICD-10-CM | POA: Diagnosis not present

## 2022-08-09 DIAGNOSIS — I10 Essential (primary) hypertension: Secondary | ICD-10-CM | POA: Diagnosis not present

## 2022-08-09 DIAGNOSIS — E876 Hypokalemia: Secondary | ICD-10-CM

## 2022-08-09 DIAGNOSIS — D500 Iron deficiency anemia secondary to blood loss (chronic): Secondary | ICD-10-CM | POA: Diagnosis not present

## 2022-08-09 DIAGNOSIS — S065XAA Traumatic subdural hemorrhage with loss of consciousness status unknown, initial encounter: Secondary | ICD-10-CM | POA: Diagnosis not present

## 2022-08-09 DIAGNOSIS — K591 Functional diarrhea: Secondary | ICD-10-CM | POA: Diagnosis not present

## 2022-08-09 LAB — BPAM RBC
Blood Product Expiration Date: 202403122359
ISSUE DATE / TIME: 202402091057
Unit Type and Rh: 5100

## 2022-08-09 LAB — TYPE AND SCREEN
ABO/RH(D): O POS
Antibody Screen: NEGATIVE
Unit division: 0

## 2022-08-09 LAB — GLUCOSE, CAPILLARY
Glucose-Capillary: 133 mg/dL — ABNORMAL HIGH (ref 70–99)
Glucose-Capillary: 133 mg/dL — ABNORMAL HIGH (ref 70–99)
Glucose-Capillary: 133 mg/dL — ABNORMAL HIGH (ref 70–99)
Glucose-Capillary: 138 mg/dL — ABNORMAL HIGH (ref 70–99)
Glucose-Capillary: 152 mg/dL — ABNORMAL HIGH (ref 70–99)
Glucose-Capillary: 155 mg/dL — ABNORMAL HIGH (ref 70–99)
Glucose-Capillary: 168 mg/dL — ABNORMAL HIGH (ref 70–99)

## 2022-08-09 LAB — OCCULT BLOOD X 1 CARD TO LAB, STOOL: Fecal Occult Bld: POSITIVE — AB

## 2022-08-09 NOTE — Progress Notes (Signed)
PROGRESS NOTE   Subjective/Complaints:  Pt seen at bedside, daughter by her side. Pt writes that she is thanksful for everyone's assistance.  No additional concerns elicited,  ROS: limited due to language/communication + PEG, L shoulder, bilateral knee pain - ongoing  Objective:   No results found. Recent Labs    08/08/22 0633 08/08/22 1852  HGB 6.4* 7.9*  HCT 21.4* 24.4*    Recent Labs    08/08/22 0633  NA 135  K 3.2*  CL 92*  CO2 33*  GLUCOSE 131*  BUN 38*  CREATININE 0.55  CALCIUM 8.6*     Intake/Output Summary (Last 24 hours) at 08/09/2022 1937 Last data filed at 08/08/2022 2010 Gross per 24 hour  Intake --  Output 2 ml  Net -2 ml          Physical Exam: Vital Signs Blood pressure (!) 142/93, pulse 89, temperature 99.6 F (37.6 C), temperature source Oral, resp. rate 16, height 5' 3"$  (1.6 m), weight 96.4 kg, SpO2 96 %.  Constitutional: No distress . Vital signs reviewed. HEENT: NCAT, lip swelling, particular lower - improved. Tongue slightly extruded.  Neck: supple, forward flexed.  Cardiovascular: RRR without murmur. No JVD    Respiratory/Chest: CTAB. Normal effort.  GI/Abdomen: Normal active bowel sounds, soft, nontender +PEG  Ext: no clubbing, cyanosis. LUE 2+ Edema - unchanged Psych: pleasant and cooperative  Skin: C/D/I. No apparent lesions. +LUE IV, c/d/I. +Peg + Sacral wound  - scabbed  MSK:      1/2 fingerbreadth subluxation L shoulder      Strength: unchanged 2/6                LUE: Flaccid                 LLE:  4-/5 proximal, 3/5 distal   Neurologic exam:  Cognition: AAO to person, place; time with options; alert. Follows simple commands, reacts to verbal input. Improving dysarthric verbalizations. Able to communicate by writing communicates mostly with gestures/hands, 100% intelligability. Follows directions Insight: Fair insight into current condition. Knows she had a stroke.    + LUE Hoffman's; +L facial droop. + eyes dont deviate past midline L. + L shoulder weakness--no change Spasticity: mild tone in neck,-- much improved!  BL foot clonus with intention.   Assessment/Plan: 1. Functional deficits which require 3+ hours per day of interdisciplinary therapy in a comprehensive inpatient rehab setting. Physiatrist is providing close team supervision and 24 hour management of active medical problems listed below. Physiatrist and rehab team continue to assess barriers to discharge/monitor patient progress toward functional and medical goals  Care Tool:  Bathing    Body parts bathed by patient: Face   Body parts bathed by helper: Left lower leg, Right lower leg, Left upper leg, Right upper leg, Buttocks, Front perineal area, Abdomen, Chest, Left arm, Right arm     Bathing assist Assist Level: Total Assistance - Patient < 25%     Upper Body Dressing/Undressing Upper body dressing   What is the patient wearing?: Pull over shirt    Upper body assist Assist Level: Total Assistance - Patient < 25%    Lower Body Dressing/Undressing Lower  body dressing      What is the patient wearing?: Pants, Incontinence brief     Lower body assist Assist for lower body dressing: Dependent - Patient 0%     Toileting Toileting    Toileting assist Assist for toileting: Dependent - Patient 0%     Transfers Chair/bed transfer  Transfers assist  Chair/bed transfer activity did not occur: Safety/medical concerns (unable 2/2 pain, fatigue, weakness)  Chair/bed transfer assist level: 2 Helpers     Locomotion Ambulation   Ambulation assist   Ambulation activity did not occur: Safety/medical concerns (unable 2/2 pain, fatigue, weakness)          Walk 10 feet activity   Assist  Walk 10 feet activity did not occur: Safety/medical concerns        Walk 50 feet activity   Assist Walk 50 feet with 2 turns activity did not occur: Safety/medical  concerns         Walk 150 feet activity   Assist Walk 150 feet activity did not occur: Safety/medical concerns         Walk 10 feet on uneven surface  activity   Assist Walk 10 feet on uneven surfaces activity did not occur: Safety/medical concerns         Wheelchair     Assist Is the patient using a wheelchair?: Yes Type of Wheelchair: Manual Wheelchair activity did not occur: Safety/medical concerns (unable 2/2 pain, fatigue, weakness)    Max wheelchair distance: 0    Wheelchair 50 feet with 2 turns activity    Assist    Wheelchair 50 feet with 2 turns activity did not occur: Safety/medical concerns       Wheelchair 150 feet activity     Assist  Wheelchair 150 feet activity did not occur: Safety/medical concerns       Blood pressure (!) 142/93, pulse 89, temperature 99.6 F (37.6 C), temperature source Oral, resp. rate 16, height 5' 3"$  (1.6 m), weight 96.4 kg, SpO2 96 %.    Medical Problem List and Plan: 1. Functional deficits secondary to subdural hematoma and ICH             - patient may shower with PEG site covered             - ELOS/Goals: 14-16 days, mod assist PT, mod assist OT, mod assist SLP  - goal DC 2/28             - Does well communicating with writing board and fingers for y/n             - Family requests change from DNR to full code on admission; changed   - Continue CIR therapies including PT, OT, and SLP    - 2/6 ordered L PRAFO, WHO, and soft cervical collar to help with head positioning OOB  2.  Antithrombotics: -DVT/anticoagulation:  Mechanical:  Antiembolism stockings, knee (TED hose) Bilateral lower extremities             -antiplatelet therapy: none   - Duplex LUE pending - 2/8 superficial vein thrombosis involving the left cephalic vein-- warm compresses and conservative treatment   3. Pain Management: Tylenol as needed    - 2/6: lidocaine patch to L shoulder for pain - benefitting    - 2/9: voltaren gel to L  shoulder, b/l knees  4. Mood/Behavior/Sleep: LCSW to evaluate and provide emotional support             -antipsychotic agents: n/a  5. Neuropsych/cognition: This patient is not capable of making decisions on her own behalf.   6. Skin/Wound Care: Routine skin care checks - coccyx PI with scab  - routine care - LAL mattress  - Dressings under PEG tube site to keep area dry, as appears moisture trapping under button 2/9   7. Fluids/Electrolytes/Nutrition: Routine Is and Os and follow-up chemistries             -NPO; has PEG             -continue Glucerna 1.5 continuous Tfs  - 2/9: Add potasium supplementation 20 meq BID; repeat labs Monday              - Consult dietary to follow for titration - BG mildly elevated but in good range given age, monitor  -Recheck K+ tomorrow Recent Labs    08/09/22 1141 08/09/22 1618 08/09/22 2005  GLUCAP 168* 133* 155*       8: Hypertension: monitor TID and prn             -continue amlodipine 10 mg daily             -losartan 50 mg daily -> Dced 2/3 d/t concern for angioedema               - Add hydralazine 10 mg PRN for SBP >180, DBP>100    08/09/2022    7:30 PM 08/09/2022    1:13 PM 08/09/2022   12:33 PM  Vitals with BMI  Weight   212 lbs 7 oz  BMI   [id number]  Systolic [id number]   Diastolic 93 74   Pulse 89 86    2/4: BP stable/high; mild tachycardia this AM; if AM labs do not show dehydration, would start HCTZ 12.5 mg daily for adjunctive control  2/5 BP with improvement over last 12 hours--will hold off on adding any further medication right now.  2/6 - mild HTN, no med changes, labs in AM  2/7 -   HTN remains mild; start hctz 6.325 mg per tube daily; labs in AM 2/9  2/9: increase HCTZ to 12.5 mg BID; add K 20 meq BID; repeat labs Monday  2/10 BP intermittently elevated, continue to monitor trend  9: Hyperlipidemia: continue statin, Zetia   10: SDH/ICH s/p crani: Finished Keppra for sz ppx.    11: Leukocytosis: current temperature 99.3,  ceftazidime completed 1/29             -Follow-up CBC with differential, mild WBC increase 2.2 likely d/t POD#1; downtrending 2/3               - Fever overnight prior to admission; POD from PEG, monitor for recurrence  - No fever recurrence 2/3; monitor  - 2/6: Labs in AM. CXR today for course lung sounds, and duplex for LUE swelling. Strict oral care QID    - 2/7: AM labs with mild subsegmental atelectasis on R; encourage ISB  -2/10 WBC 7.0 on 2/7, no signs of infection  12: Anemia, multifactorial:               -Follow-up CBC -hgb with sl decrease again today, normocytic  -check stool for OB  -PA added b-supp. Add Fe++ too  - 2/9> transfuse 1 unit for HgB 6.4 today. FOBT pending.   2/10 FOBT pos, recheck, recheck H&H tomorrow, consider GI consult   13. ?Angioedema vs. Chlorhexadine reaction - improving             -  Improving per family after DC chlorhexidine skin wipes and oral mouthwash              - Per SLP notes, interferes with tongue movement, speaking             - Hx cough with Enalipril, Losartan increased from OP 25 mg to 50 mg 1/14, never weaned; edema started 1/30 per chart review             - d/w pharmacy 2/4 and agree that reaction can occur at any time, and may cross-react with ARB. Will attempt to titrate BP meds without ACE/ARBs and monitor swelling.   14. L hemineglect - causing R gaze preference and positional tone             - Repositioning in bed to straighten head--head more neutral today 2/5             - May need to consider botox given need to recover cognitive function and relatively little tone elsewhere  - 2/6: E stim and taping to L shoulder for mild sublux per OT.   15. Urinary incontinence  - Foley removed 2/6  - PVRs QID and ISC for >350 ccs retention  - 2/7: 1x PVR low; monitor; ? Incontinence vs mixed, will consider timed toiletting after more information gathered  52. Dysphagia, s/p PEG placement.   - Remains NPO  - Strict oral care QID   -  CXR today given course breath sounds, weak cough, poor secretion clearance - R subsegmental atelectatis, encourage ISB  17. Diarrhea  - loose stools likely d/t tube feeds; on fibercon; will add immodium for >2 diarrhea BM in 24 hours    LOS: 7 days A FACE TO FACE EVALUATION WAS PERFORMED  Jennye Boroughs 08/09/2022, 7:37 PM

## 2022-08-09 NOTE — Progress Notes (Signed)
Patient weighed per order. Patient weighed on air mattress bed, 37.64 kg. Will attempt to reweigh patient.

## 2022-08-09 NOTE — Progress Notes (Signed)
Occupational Therapy Session Note  Patient Details  Name: Gina Kelley MRN: [id number] Date of Birth: 12-Aug-1935  Today's Date: 08/09/2022 OT Individual Time: 1020-1035 OT Individual Time Calculation (min): 15 min    Short Term Goals: Week 1:  OT Short Term Goal 1 (Week 1): Pt will complete UB bathing with Max A seated on EOB or at sink. OT Short Term Goal 2 (Week 1): Pt will complete LB bating with Max A seated on EOB or at sink. OT Short Term Goal 3 (Week 1): Pt will increase bed mobility while transitioning to seated on EOB from supine with Max A. OT Short Term Goal 4 (Week 1): Pt will increase static sitting balance to [employer] while completing functional self care task. OT Short Term Goal 5 (Week 1): Pt will increase attention to her immediate environment by locating all items needed for grooming task when placed to her left.  Skilled Therapeutic Interventions/Progress Updates:    OT completed make up session secondary to missed therapy time yesterday with fatigue. Pt received in tilt in space w/c with grandson present. OT provided gentle PROM and AAROM exercises to LUE. With muscle tapping to triceps, slight activation noted on 3/5 trials. At end of session, OT positioned LUE on pillow for support and pt left in chair with safety belt and call light. Grandson also present.   Therapy Documentation Precautions:  Precautions Precautions: Fall Precaution Comments: peg tube. HOB no lower than 30*. Left dense hemi. right gaze preference, left side inattention Required Braces or Orthoses: Other Brace Other Brace: PRAFO boots on in bed Restrictions Weight Bearing Restrictions: No General:   Vital Signs:  Pain:   ADL: ADL Eating: NPO Grooming: Dependent Where Assessed-Grooming: Wheelchair Upper Body Bathing: Dependent Where Assessed-Upper Body Bathing: Sitting at sink, Wheelchair, Bed level Lower Body Bathing: Dependent Where Assessed-Lower Body Bathing: Bed level Upper  Body Dressing: Dependent Where Assessed-Upper Body Dressing: Wheelchair, Sitting at sink, Bed level Lower Body Dressing: Dependent Where Assessed-Lower Body Dressing: Bed level Toileting: Dependent Where Assessed-Toileting: Bed level Toilet Transfer: Dependent Toilet Transfer Method: Squat pivot Tub/Shower Transfer: Not assessed Social research officer, government: Not assessed Vision   Perception    Praxis   Balance   Exercises:   Other Treatments:     Therapy/Group: Individual Therapy  Duayne Cal 08/09/2022, 10:36 AM

## 2022-08-09 NOTE — Progress Notes (Signed)
Physical Therapy Session Note  Patient Details  Name: KHRISTAL BAYDOUN MRN: [id number] Date of Birth: 25-Aug-1935  Today's Date: 08/09/2022 PT Individual Time: 0802-0859 PT Individual Time Calculation (min): 57 min   Short Term Goals: Week 1:  PT Short Term Goal 1 (Week 1): Pt will roll with max A or less PT Short Term Goal 2 (Week 1): Pt will perform supine to sit with max A PT Short Term Goal 3 (Week 1): Pt will require min A for static sitting balance  Skilled Therapeutic Interventions/Progress Updates:     Pt received semi reclined in bed and agrees to therapy. Indicates pain at site of PEG tube. PT provides repositioning to avoid aggravating pain. Pt's brief noted to be soiled with urine. NT present and assists with hygiene. Pt performs rolling to the L with maxA and cues for body mechanics and hand placement. Pt requires totalA for roll to the R. After NT assists with pericare and brief change, pt perform supine to sit with maxA +1 and cues for hand placement, sequencing, and positioning at EOB. Pt performs stand pivot transfer to [facility] with maxA +1 and cues for initiation, sequencing, and posture. WC transport to gym. PT performs standing activity in standing frame to provide pt with activity tolerance challenge as well as WB through bilateral lower extremities. Pt performs sit to partial stand with maxA from PT and cues for hip extension, and rehab tech places standing frame sling under hips. Pt performs sit to stand with standing frame with totalA and cues for hip extension and trunk extension, as well as utilization of R upper extremity to assist with midline positioning. Pt able to stand x2 bouts for ~5 minutes each, with extended seated rest break between bouts. PT provides cues for upright gaze, as well as hip and trunk extension to improve body mechanics in standing, as well as sequencing for optimal motor planning of stand<>sit.   Pt left seated in TIS WC with alarm intact and all needs  within reach.   Therapy Documentation Precautions:  Precautions Precautions: Fall Precaution Comments: peg tube. HOB no lower than 30*. Left dense hemi. right gaze preference, left side inattention Required Braces or Orthoses: Other Brace Other Brace: PRAFO boots on in bed Restrictions Weight Bearing Restrictions: No    Therapy/Group: Individual Therapy  Breck Coons, PT, DPT 08/09/2022, 4:03 PM

## 2022-08-10 DIAGNOSIS — K591 Functional diarrhea: Secondary | ICD-10-CM | POA: Diagnosis not present

## 2022-08-10 DIAGNOSIS — D500 Iron deficiency anemia secondary to blood loss (chronic): Secondary | ICD-10-CM | POA: Diagnosis not present

## 2022-08-10 DIAGNOSIS — E876 Hypokalemia: Secondary | ICD-10-CM | POA: Diagnosis not present

## 2022-08-10 DIAGNOSIS — D72829 Elevated white blood cell count, unspecified: Secondary | ICD-10-CM | POA: Diagnosis not present

## 2022-08-10 DIAGNOSIS — S065XAA Traumatic subdural hemorrhage with loss of consciousness status unknown, initial encounter: Secondary | ICD-10-CM | POA: Diagnosis not present

## 2022-08-10 DIAGNOSIS — I10 Essential (primary) hypertension: Secondary | ICD-10-CM | POA: Diagnosis not present

## 2022-08-10 LAB — HEMOGLOBIN AND HEMATOCRIT, BLOOD
HCT: 25.9 % — ABNORMAL LOW (ref 36.0–46.0)
Hemoglobin: 8 g/dL — ABNORMAL LOW (ref 12.0–15.0)

## 2022-08-10 LAB — GLUCOSE, CAPILLARY
Glucose-Capillary: 137 mg/dL — ABNORMAL HIGH (ref 70–99)
Glucose-Capillary: 155 mg/dL — ABNORMAL HIGH (ref 70–99)
Glucose-Capillary: 158 mg/dL — ABNORMAL HIGH (ref 70–99)
Glucose-Capillary: 123 mg/dL — ABNORMAL HIGH (ref 70–99)

## 2022-08-10 LAB — BASIC METABOLIC PANEL
Anion gap: 11 (ref 5–15)
BUN: 35 mg/dL — ABNORMAL HIGH (ref 8–23)
CO2: 29 mmol/L (ref 22–32)
Calcium: 8.2 mg/dL — ABNORMAL LOW (ref 8.9–10.3)
Chloride: 94 mmol/L — ABNORMAL LOW (ref 98–111)
Creatinine, Ser: 0.6 mg/dL (ref 0.44–1.00)
GFR, Estimated: >60 mL/min (ref >60)
Glucose, Bld: 158 mg/dL — ABNORMAL HIGH (ref 70–99)
Potassium: 3.6 mmol/L (ref 3.5–5.1)
Sodium: 134 mmol/L — ABNORMAL LOW (ref 135–145)

## 2022-08-10 LAB — OCCULT BLOOD X 1 CARD TO LAB, STOOL
Fecal Occult Bld: POSITIVE — AB
Fecal Occult Bld: NEGATIVE

## 2022-08-10 LAB — POTASSIUM: Potassium: 3.6 mmol/L (ref 3.5–5.1)

## 2022-08-10 NOTE — Progress Notes (Signed)
PROGRESS NOTE   Subjective/Complaints:  Pt seen at beside. Getting labs when I first came it. No concerns.   ROS: limited due to language/communication + PEG, L shoulder, bilateral knee pain - ongoing, no CP, SOB  Objective:   No results found. Recent Labs    08/08/22 1852 08/10/22 1024  HGB 7.9* 8.0*  HCT 24.4* 25.9*    Recent Labs    08/08/22 0633 08/10/22 1024  NA 135  --   K 3.2* 3.6  CL 92*  --   CO2 33*  --   GLUCOSE 131*  --   BUN 38*  --   CREATININE 0.55  --   CALCIUM 8.6*  --      Intake/Output Summary (Last 24 hours) at 08/10/2022 1124 Last data filed at 08/10/2022 0741 Gross per 24 hour  Intake 738 ml  Output 2 ml  Net 736 ml      Pressure Injury Sacrum Mid Stage 2 -  Partial thickness loss of dermis presenting as a shallow open injury with a red, pink wound bed without slough. Breakdown of skin over dermis area over bony prominence mid-area of sacrum (Active)     Location: Sacrum  Location Orientation: Mid  Staging: Stage 2 -  Partial thickness loss of dermis presenting as a shallow open injury with a red, pink wound bed without slough.  Wound Description (Comments): Breakdown of skin over dermis area over bony prominence mid-area of sacrum  Present on Admission:      Physical Exam: Vital Signs Blood pressure (!) 143/75, pulse 81, temperature 99.7 F (37.6 C), temperature source Oral, resp. rate 16, height 5' 3"$  (1.6 m), weight 95.3 kg, SpO2 94 %.  Constitutional: No distress . Vital signs reviewed. Appears comfortable in bed HEENT: NCAT, lip swelling, particular lower - improved Neck: supple, forward flexed.  Cardiovascular: RRR without murmur. No JVD    Respiratory/Chest: CTAB. No RRW, Normal effort.  GI/Abdomen: Normal active bowel sounds, soft, nontender +PEG  Ext: no clubbing, cyanosis. LUE 2+ Edema - unchanged Psych: pleasant and cooperative  Skin: C/D/I. No apparent lesions.  +LUE IV, c/d/I. +Peg + Sacral wound  - scabbed  MSK:      1/2 fingerbreadth subluxation L shoulder      Strength: unchanged 2/6                LUE: Flaccid                 LLE:  4-/5 proximal, 3/5 distal   Neurologic exam:  Cognition: AAO to person, place; time with options; alert. Follows simple commands, reacts to verbal input. Improving dysarthric verbalizations. Able to communicate by writing communicates mostly with gestures/hands, 100% intelligability. Follows directions Insight: Fair insight into current condition. Knows she had a stroke.   + LUE Hoffman's; +L facial droop. + eyes dont deviate past midline L. + L shoulder weakness--no change Spasticity: mild tone in neck,-- much improved!  BL foot clonus with intention.   Assessment/Plan: 1. Functional deficits which require 3+ hours per day of interdisciplinary therapy in a comprehensive inpatient rehab setting. Physiatrist is providing close team supervision and 24 hour management of active medical  problems listed below. Physiatrist and rehab team continue to assess barriers to discharge/monitor patient progress toward functional and medical goals  Care Tool:  Bathing    Body parts bathed by patient: Face   Body parts bathed by helper: Left lower leg, Right lower leg, Left upper leg, Right upper leg, Buttocks, Front perineal area, Abdomen, Chest, Left arm, Right arm     Bathing assist Assist Level: Total Assistance - Patient < 25%     Upper Body Dressing/Undressing Upper body dressing   What is the patient wearing?: Pull over shirt    Upper body assist Assist Level: Total Assistance - Patient < 25%    Lower Body Dressing/Undressing Lower body dressing      What is the patient wearing?: Pants, Incontinence brief     Lower body assist Assist for lower body dressing: Dependent - Patient 0%     Toileting Toileting    Toileting assist Assist for toileting: Dependent - Patient 0%     Transfers Chair/bed  transfer  Transfers assist  Chair/bed transfer activity did not occur: Safety/medical concerns (unable 2/2 pain, fatigue, weakness)  Chair/bed transfer assist level: 2 Helpers     Locomotion Ambulation   Ambulation assist   Ambulation activity did not occur: Safety/medical concerns (unable 2/2 pain, fatigue, weakness)          Walk 10 feet activity   Assist  Walk 10 feet activity did not occur: Safety/medical concerns        Walk 50 feet activity   Assist Walk 50 feet with 2 turns activity did not occur: Safety/medical concerns         Walk 150 feet activity   Assist Walk 150 feet activity did not occur: Safety/medical concerns         Walk 10 feet on uneven surface  activity   Assist Walk 10 feet on uneven surfaces activity did not occur: Safety/medical concerns         Wheelchair     Assist Is the patient using a wheelchair?: Yes Type of Wheelchair: Manual Wheelchair activity did not occur: Safety/medical concerns (unable 2/2 pain, fatigue, weakness)    Max wheelchair distance: 0    Wheelchair 50 feet with 2 turns activity    Assist    Wheelchair 50 feet with 2 turns activity did not occur: Safety/medical concerns       Wheelchair 150 feet activity     Assist  Wheelchair 150 feet activity did not occur: Safety/medical concerns       Blood pressure (!) 143/75, pulse 81, temperature 99.7 F (37.6 C), temperature source Oral, resp. rate 16, height 5' 3"$  (1.6 m), weight 95.3 kg, SpO2 94 %.    Medical Problem List and Plan: 1. Functional deficits secondary to subdural hematoma and ICH             - patient may shower with PEG site covered             - ELOS/Goals: 14-16 days, mod assist PT, mod assist OT, mod assist SLP  - goal DC 2/28             - Does well communicating with writing board and fingers for y/n             - Family requests change from DNR to full code on admission; changed   - Continue CIR therapies  including PT, OT, and SLP    - 2/6 ordered L PRAFO, WHO, and soft cervical  collar to help with head positioning OOB  -Able to stand x2 for 5 min with PT  2.  Antithrombotics: -DVT/anticoagulation:  Mechanical:  Antiembolism stockings, knee (TED hose) Bilateral lower extremities             -antiplatelet therapy: none   - Duplex LUE pending - 2/8 superficial vein thrombosis involving the left cephalic vein-- warm compresses and conservative treatment   3. Pain Management: Tylenol as needed    - 2/6: lidocaine patch to L shoulder for pain - benefitting    - 2/9: voltaren gel to L shoulder, b/l knees  4. Mood/Behavior/Sleep: LCSW to evaluate and provide emotional support             -antipsychotic agents: n/a   5. Neuropsych/cognition: This patient is not capable of making decisions on her own behalf.   6. Skin/Wound Care: Routine skin care checks - coccyx PI with scab  - routine care - LAL mattress  - Dressings under PEG tube site to keep area dry, as appears moisture trapping under button 2/9   7. Fluids/Electrolytes/Nutrition: Routine Is and Os and follow-up chemistries             -NPO; has PEG             -continue Glucerna 1.5 continuous Tfs  - 2/9: Add potasium supplementation 20 meq BID; repeat labs Monday              - Consult dietary to follow for titration - BG mildly elevated but in good range given age, monitor  -2/11 K+ 3.6, stable Recent Labs    08/09/22 2005 08/09/22 2357 08/10/22 0357  GLUCAP 155* 133* 137*        8: Hypertension: monitor TID and prn             -continue amlodipine 10 mg daily             -losartan 50 mg daily -> Dced 2/3 d/t concern for angioedema               - Add hydralazine 10 mg PRN for SBP >180, DBP>100    08/10/2022    4:02 AM 08/09/2022    7:30 PM 08/09/2022    1:13 PM  Vitals with BMI  Weight 210 lbs    BMI [id number]    Systolic [id number]  Diastolic 75 93 74  Pulse 81 89 86   2/4: BP stable/high; mild tachycardia this AM; if  AM labs do not show dehydration, would start HCTZ 12.5 mg daily for adjunctive control  2/5 BP with improvement over last 12 hours--will hold off on adding any further medication right now.  2/6 - mild HTN, no med changes, labs in AM  2/7 -   HTN remains mild; start hctz 6.325 mg per tube daily; labs in AM 2/9  2/9: increase HCTZ to 12.5 mg BID; add K 20 meq BID; repeat labs Monday  2/11 BP stable today, continue to monitor  9: Hyperlipidemia: continue statin, Zetia   10: SDH/ICH s/p crani: Finished Keppra for sz ppx.    11: Leukocytosis: current temperature 99.3, ceftazidime completed 1/29             -Follow-up CBC with differential, mild WBC increase 2.2 likely d/t POD#1; downtrending 2/3               - Fever overnight prior to admission; POD from PEG, monitor for recurrence  - No fever recurrence  2/3; monitor  - 2/6: Labs in AM. CXR today for course lung sounds, and duplex for LUE swelling. Strict oral care QID    - 2/7: AM labs with mild subsegmental atelectasis on R; encourage ISB  -2/10 WBC 7.0 on 2/7, no signs of infection  12: Anemia, multifactorial:               -Follow-up CBC -hgb with sl decrease again today, normocytic  -check stool for OB  -PA added b-supp. Add Fe++ too  - 2/9> transfuse 1 unit for HgB 6.4 today. FOBT pending.   2/10 FOBT pos, recheck, recheck H&H tomorrow, consider GI consult  2/11 H/H stable, spoke with GI, no intervention at this time supportive care, GI ordered BMP,  re consult if HGB continues to decrease, appreciate assistance    13. ?Angioedema vs. Chlorhexadine reaction - improving             - Improving per family after DC chlorhexidine skin wipes and oral mouthwash              - Per SLP notes, interferes with tongue movement, speaking             - Hx cough with Enalipril, Losartan increased from OP 25 mg to 50 mg 1/14, never weaned; edema started 1/30 per chart review             - d/w pharmacy 2/4 and agree that reaction can occur at any  time, and may cross-react with ARB. Will attempt to titrate BP meds without ACE/ARBs and monitor swelling.   14. L hemineglect - causing R gaze preference and positional tone             - Repositioning in bed to straighten head--head more neutral today 2/5             - May need to consider botox given need to recover cognitive function and relatively little tone elsewhere  - 2/6: E stim and taping to L shoulder for mild sublux per OT.   15. Urinary incontinence  - Foley removed 2/6  - PVRs QID and ISC for >350 ccs retention  - 2/7: 1x PVR low; monitor; ? Incontinence vs mixed, will consider timed toiletting after more information gathered  69. Dysphagia, s/p PEG placement.   - Remains NPO  - Strict oral care QID   - CXR today given course breath sounds, weak cough, poor secretion clearance - R subsegmental atelectatis, encourage ISB  17. Diarrhea  - loose stools likely d/t tube feeds; on fibercon; will add immodium for >2 diarrhea BM in 24 hours  - type 6 stool today x1 appears to be improving  LOS: 8 days A FACE TO FACE EVALUATION WAS PERFORMED  Jennye Boroughs 08/10/2022, 11:24 AM

## 2022-08-10 NOTE — Progress Notes (Signed)
Occupational Therapy Session Note  Patient Details  Name: Gina Kelley MRN: [id number] Date of Birth: [birth year]  Today's Date: 08/10/2022 OT Individual Time: [id number] Individual Time Calculation (min): 70 min    Short Term Goals: Week 1:  OT Short Term Goal 1 (Week 1): Pt will complete UB bathing with Max A seated on EOB or at sink. OT Short Term Goal 2 (Week 1): Pt will complete LB bating with Max A seated on EOB or at sink. OT Short Term Goal 3 (Week 1): Pt will increase bed mobility while transitioning to seated on EOB from supine with Max A. OT Short Term Goal 4 (Week 1): Pt will increase static sitting balance to [employer] while completing functional self care task. OT Short Term Goal 5 (Week 1): Pt will increase attention to her immediate environment by locating all items needed for grooming task when placed to her left.  Skilled Therapeutic Interventions/Progress Updates:    Pt sleeping in bed upon arrival. Min multimodal cues to arouse. Pt incontinent of bowel/bladder. Tot A +2 for bed mobility and hygiene/clothing mgmt. Pt requires tot A to initiate rolling in bed and tot A+2 to maintain. Pt unable to participate in hygiene. Supine>sit EOB with tot A+2. Tot A for sitting balance. Sit>supine with tot A+2. Pt remained in bed with all needs within reach.   Therapy Documentation Precautions:  Precautions Precautions: Fall Precaution Comments: peg tube. HOB no lower than 30*. Left dense hemi. right gaze preference, left side inattention Required Braces or Orthoses: Other Brace Other Brace: PRAFO boots on in bed Restrictions Weight Bearing Restrictions: No Pain: Pain Assessment Pain Scale: 0-10 Pain Score: 0-No pain Faces Pain Scale: No hurt Other Treatments:     Therapy/Group: Individual Therapy  Leroy Libman 08/10/2022, 1:44 PM

## 2022-08-10 NOTE — Progress Notes (Signed)
Occupational Therapy Session Note  Patient Details  Name: Gina Kelley MRN: [id number] Date of Birth: 1935/09/16  Today's Date: 08/10/2022 OT Individual Time: [id number] Individual Time Calculation (min): 75 min    Short Term Goals: Week 1:  OT Short Term Goal 1 (Week 1): Pt will complete UB bathing with Max A seated on EOB or at sink. OT Short Term Goal 2 (Week 1): Pt will complete LB bating with Max A seated on EOB or at sink. OT Short Term Goal 3 (Week 1): Pt will increase bed mobility while transitioning to seated on EOB from supine with Max A. OT Short Term Goal 4 (Week 1): Pt will increase static sitting balance to [employer] while completing functional self care task. OT Short Term Goal 5 (Week 1): Pt will increase attention to her immediate environment by locating all items needed for grooming task when placed to her left.  Skilled Therapeutic Interventions/Progress Updates:    Patient was asleep upon arrival, I call out her name and she opened her eyes.  I introduced myself and indicated the nature of the visit and the pt nodded in agreement secondary to challenges with verbal response. The pt was in agreement with manual muscle stimulation to improve active participation in BADL related task performance.The pt was able to tolerate manipulation of the neck, scapular, shld , forearm and hand to improve communication of the LUE .   The pt was able to follow through by turning her head from left to right , shrugging her shld with attempts made with the L side of the body.  The pt went on to demonstrate AROM of the RUE while  I ranged the LUE.  The pt was then transferred from supine in bed to EOB with the assistance of  the rehab tech, the pt was positioned with pillows to aid with task performance. The pt was given an opportunity to wt bear through BUE  by leaning to the right and left and rotating to improve communication to the extremities.  The pt was then instructed to grab the bed  rail with her RUE and grasp it to bring her UB into a seated position while unsupported 4X for a count of 5.  The pt was only able to maintain position for a count of 2-3 with 1 rest breaks to follow each set.  The pt was returned to bed LOF with MaxA and she was positioned more on her left side to allow communication to the extremity with pillow placement underneath the LUE secondary to edema present at the time of treatment.  The pt indicated that she felt much better. The bedside table and call light were placed within reach and the bed alarm was activated. All additional needs were addressed prior to exiting the room.  Therapy Documentation Precautions:  Precautions Precautions: Fall Precaution Comments: peg tube. HOB no lower than 30*. Left dense hemi. right gaze preference, left side inattention Required Braces or Orthoses: Other Brace Other Brace: PRAFO boots on in bed Restrictions Weight Bearing Restrictions: No   Therapy/Group: Individual Therapy  Yvonne Kendall 08/10/2022, 12:56 PM

## 2022-08-11 LAB — CBC
HCT: 26.3 % — ABNORMAL LOW (ref 36.0–46.0)
Hemoglobin: 8.5 g/dL — ABNORMAL LOW (ref 12.0–15.0)
MCH: 29 pg (ref 26.0–34.0)
MCHC: 32.3 g/dL (ref 30.0–36.0)
MCV: 89.8 fL (ref 80.0–100.0)
Platelets: 285 10*3/uL (ref 150–400)
RBC: 2.93 MIL/uL — ABNORMAL LOW (ref 3.87–5.11)
RDW: 17 % — ABNORMAL HIGH (ref 11.5–15.5)
WBC: 6.4 10*3/uL (ref 4.0–10.5)
nRBC: 0 % (ref 0.0–0.2)

## 2022-08-11 LAB — BASIC METABOLIC PANEL
Anion gap: 6 (ref 5–15)
BUN: 31 mg/dL — ABNORMAL HIGH (ref 8–23)
CO2: 34 mmol/L — ABNORMAL HIGH (ref 22–32)
Calcium: 8.2 mg/dL — ABNORMAL LOW (ref 8.9–10.3)
Chloride: 95 mmol/L — ABNORMAL LOW (ref 98–111)
Creatinine, Ser: 0.57 mg/dL (ref 0.44–1.00)
GFR, Estimated: >60 mL/min (ref >60)
Glucose, Bld: 160 mg/dL — ABNORMAL HIGH (ref 70–99)
Potassium: 3.4 mmol/L — ABNORMAL LOW (ref 3.5–5.1)
Sodium: 135 mmol/L (ref 135–145)

## 2022-08-11 LAB — GLUCOSE, CAPILLARY
Glucose-Capillary: 118 mg/dL — ABNORMAL HIGH (ref 70–99)
Glucose-Capillary: 133 mg/dL — ABNORMAL HIGH (ref 70–99)
Glucose-Capillary: 147 mg/dL — ABNORMAL HIGH (ref 70–99)
Glucose-Capillary: 148 mg/dL — ABNORMAL HIGH (ref 70–99)
Glucose-Capillary: 156 mg/dL — ABNORMAL HIGH (ref 70–99)
Glucose-Capillary: 185 mg/dL — ABNORMAL HIGH (ref 70–99)
Glucose-Capillary: 192 mg/dL — ABNORMAL HIGH (ref 70–99)

## 2022-08-11 MED ORDER — POTASSIUM CHLORIDE 20 MEQ PO PACK
40.0000 meq | PACK | Freq: Every day | ORAL | Status: AC
  Administered 2022-08-11 – 2022-08-13 (×3): 40 meq
  Filled 2022-08-11 (×3): qty 2

## 2022-08-11 NOTE — Progress Notes (Signed)
Physical Therapy Weekly Progress Note  Patient Details  Name: Gina Kelley MRN: [id number] Date of Birth: 1935-11-03  Beginning of progress report period: August 03, 2022 End of progress report period: August 11, 2022  Today's Date: 08/11/2022 PT Individual Time: 1330-1445 PT Individual Time Calculation (min): 75 min   Patient has met 3 of 3 short term goals and is slowly making progress toward her established long term goals since initial evaluation. Patient currently requires MaxA for all bed mobility and transfers via squat pivot; TotalA for sit/stand with poor tolerance. Patient demonstrated improved sitting balance today only requiring MinA/CGA for static and dynamic sitting balance.   Patient continues to demonstrate the following deficits muscle weakness and muscle joint tightness, decreased cardiorespiratoy endurance, decreased coordination and decreased motor planning, decreased visual perceptual skills, decreased midline orientation, decreased attention to left, and decreased motor planning, decreased initiation, decreased attention, decreased awareness, decreased problem solving, decreased safety awareness, decreased memory, and delayed processing, pain in R knee, and decreased sitting balance, decreased standing balance, decreased postural control, hemiplegia, and decreased balance strategies and therefore will continue to benefit from skilled PT intervention to increase functional independence with mobility.  Patient progressing toward long term goals..  Continue plan of care.  PT Short Term Goals Week 1:  PT Short Term Goal 1 (Week 1): Pt will roll with max A or less PT Short Term Goal 1 - Progress (Week 1): Met PT Short Term Goal 2 (Week 1): Pt will perform supine to sit with max A PT Short Term Goal 2 - Progress (Week 1): Met PT Short Term Goal 3 (Week 1): Pt will require min A for static sitting balance PT Short Term Goal 3 - Progress (Week 1): Met Week 2:  PT Short  Term Goal 1 (Week 2): Patient will perform bed/chair transfer with LRAD and ModA PT Short Term Goal 2 (Week 2): Patient will maintain dynamic sitting balance with CGA PT Short Term Goal 3 (Week 2): Patient will initiate sit/stand with LRAD  Skilled Therapeutic Interventions/Progress Updates:  Patient greeted supine in bed and agreeable to PT treatment session. While in supine, therapist threaded pants with MaxA for time management. Patient rolled L and R with Mod/MaxA in order for therapist to pull pants over her hips. Patient then was able to place B LE off the bed with MinA and then right her trunk with ModA- Max VC for improved sequencing and hand placement with good effort noted. While sitting EOB, patient required VC for improved sitting balance and SBA for safety. Patient performed squat pivot transfer from EOB to TIS wheelchair with MaxA- VC and increased time required for patient to shift her weight anteriorly in preparation for transfer. Patient wheeled to rehab gym for time management and energy conservation.   Patient performed squat pivot transfer to/from TIS wheelchair and mat table with MaxA and notable improvements with patient assistance. Patient sat EOM ~35' with SBA/MinA for sitting balance. Multimodal cues for improved midline posturing with rehab tech on patient's R side to encourage R lateral weight shift, mirror placed in front for external visual cues regarding posturing, 4" block placed under L foot to prevent pushing to L and L UE propped on 6" block and wedge to also assist with midline posturing. Patient tasked with reaching in front and outside her BOS for 5 different colored cones with her R UE and then scanning to her L to hand the cones back to therapist. Patient was able to verbalize each colored  cone with 100% accuracy. Patient was then tasked with picking up each cone off the floor with her R UE in order to promote anterior weight shift as patient presents with posterior  bias.   Patient returned to her room reclined in TIS wheelchair with posey belt on, call bell within reach, neck pillow on and all needs met. Feeding tube re-started at end of treatment session.     Therapy Documentation Precautions:  Precautions Precautions: Fall Precaution Comments: peg tube. HOB no lower than 30*. Left dense hemi. right gaze preference, left side inattention Required Braces or Orthoses: Other Brace Other Brace: PRAFO boots on in bed Restrictions Weight Bearing Restrictions: No   Therapy/Group: Individual Therapy  Dana Dorner 08/11/2022, 8:02 AM

## 2022-08-11 NOTE — Progress Notes (Signed)
PROGRESS NOTE   Subjective/Complaints:  No events overnight. Pain in L shoulder much improved. Doing well overall, no complaints. Can feel the urge to go but cannot wait for nursing to get to toilet for bowel or bladder.   LBM 2/12.   ROS: limited due to language/communication + PEG, L shoulder, bilateral knee pain - improved  Objective:   No results found. Recent Labs    08/10/22 1024 08/11/22 0838  WBC  --  6.4  HGB 8.0* 8.5*  HCT 25.9* 26.3*  PLT  --  285    Recent Labs    08/10/22 1140 08/11/22 0838  NA 134* 135  K 3.6 3.4*  CL 94* 95*  CO2 29 34*  GLUCOSE 158* 160*  BUN 35* 31*  CREATININE 0.60 0.57  CALCIUM 8.2* 8.2*     Intake/Output Summary (Last 24 hours) at 08/11/2022 1725 Last data filed at 08/11/2022 1231 Gross per 24 hour  Intake 675 ml  Output --  Net 675 ml      Pressure Injury Sacrum Mid Stage 2 -  Partial thickness loss of dermis presenting as a shallow open injury with a red, pink wound bed without slough. Breakdown of skin over dermis area over bony prominence mid-area of sacrum (Active)     Location: Sacrum  Location Orientation: Mid  Staging: Stage 2 -  Partial thickness loss of dermis presenting as a shallow open injury with a red, pink wound bed without slough.  Wound Description (Comments): Breakdown of skin over dermis area over bony prominence mid-area of sacrum  Present on Admission:      Physical Exam: Vital Signs Blood pressure (!) 153/75, pulse 83, temperature 98.9 F (37.2 C), temperature source Oral, resp. rate 16, height 5' 3"$  (1.6 m), weight 95.3 kg, SpO2 98 %.  Constitutional: No distress . Vital signs reviewed. Appears comfortable in bed HEENT: NCAT, lip swelling, particular lower - improved Neck: supple, forward flexed.  Cardiovascular: RRR without murmur. No JVD    Respiratory/Chest: CTAB. No RRW, Normal effort.  GI/Abdomen: Normal active bowel sounds, soft,  nontender +PEG  Ext: no clubbing, cyanosis. LUE 1+ Edema - improving Psych: pleasant and cooperative  Skin: C/D/I. No apparent lesions.   c/d/I. +Peg + Sacral wound  - scabbed  MSK:      1/2 fingerbreadth subluxation L shoulder      Strength: unchanged 2/6                LUE: Flaccid                 LLE:  4-/5 proximal, 3/5 distal   Neurologic exam:  Cognition: AAO to person, place, and time. Follows simple commands, reacts to verbal input. Improving dysarthric verbalizations - able to answer 80% of questions verbally with encouragement today!   Insight: Fair insight into current condition. Knows she had a stroke.   + LUE Hoffman's; +L facial droop. + eyes dont deviate past midline L. + L shoulder weakness--no change Spasticity: mild tone in neck, MAS 1  BL foot clonus with intention.   Assessment/Plan: 1. Functional deficits which require 3+ hours per day of interdisciplinary therapy in a comprehensive  inpatient rehab setting. Physiatrist is providing close team supervision and 24 hour management of active medical problems listed below. Physiatrist and rehab team continue to assess barriers to discharge/monitor patient progress toward functional and medical goals  Care Tool:  Bathing    Body parts bathed by patient: Face   Body parts bathed by helper: Left lower leg, Right lower leg, Left upper leg, Right upper leg, Buttocks, Front perineal area, Abdomen, Chest, Left arm, Right arm     Bathing assist Assist Level: Total Assistance - Patient < 25%     Upper Body Dressing/Undressing Upper body dressing   What is the patient wearing?: Pull over shirt    Upper body assist Assist Level: Total Assistance - Patient < 25%    Lower Body Dressing/Undressing Lower body dressing      What is the patient wearing?: Pants, Incontinence brief     Lower body assist Assist for lower body dressing: Dependent - Patient 0%     Toileting Toileting    Toileting assist Assist for  toileting: Dependent - Patient 0%     Transfers Chair/bed transfer  Transfers assist  Chair/bed transfer activity did not occur: Safety/medical concerns (unable 2/2 pain, fatigue, weakness)  Chair/bed transfer assist level: 2 Helpers     Locomotion Ambulation   Ambulation assist   Ambulation activity did not occur: Safety/medical concerns (unable 2/2 pain, fatigue, weakness)          Walk 10 feet activity   Assist  Walk 10 feet activity did not occur: Safety/medical concerns        Walk 50 feet activity   Assist Walk 50 feet with 2 turns activity did not occur: Safety/medical concerns         Walk 150 feet activity   Assist Walk 150 feet activity did not occur: Safety/medical concerns         Walk 10 feet on uneven surface  activity   Assist Walk 10 feet on uneven surfaces activity did not occur: Safety/medical concerns         Wheelchair     Assist Is the patient using a wheelchair?: Yes Type of Wheelchair: Manual Wheelchair activity did not occur: Safety/medical concerns (unable 2/2 pain, fatigue, weakness)    Max wheelchair distance: 0    Wheelchair 50 feet with 2 turns activity    Assist    Wheelchair 50 feet with 2 turns activity did not occur: Safety/medical concerns       Wheelchair 150 feet activity     Assist  Wheelchair 150 feet activity did not occur: Safety/medical concerns       Blood pressure (!) 153/75, pulse 83, temperature 98.9 F (37.2 C), temperature source Oral, resp. rate 16, height 5' 3"$  (1.6 m), weight 95.3 kg, SpO2 98 %.    Medical Problem List and Plan: 1. Functional deficits secondary to subdural hematoma and ICH             - patient may shower with PEG site covered             - ELOS/Goals: 14-16 days, mod assist PT, mod assist OT, mod assist SLP  - goal DC 2/28             - Does well communicating with writing board and fingers for y/n             - Family requests change from DNR to  full code on admission; changed   - Continue CIR therapies including  PT, OT, and SLP    - 2/6 ordered L PRAFO, WHO, and soft cervical collar to help with head positioning OOB  -Able to stand x2 for 5 min with PT  2.  Antithrombotics: -DVT/anticoagulation:  Mechanical:  Antiembolism stockings, knee (TED hose) Bilateral lower extremities; add SCDs             -antiplatelet therapy: none   - Duplex LUE pending - 2/8 superficial vein thrombosis involving the left cephalic vein-- warm compresses and conservative treatment   - 2/13 will Inquire with neurosurg on medication DVT PPX given dense hemiplegia   3. Pain Management: Tylenol as needed  - improved   - 2/6: lidocaine patch to L shoulder for pain - benefitting    - 2/9: voltaren gel to L shoulder, b/l knees  4. Mood/Behavior/Sleep: LCSW to evaluate and provide emotional support             -antipsychotic agents: n/a   5. Neuropsych/cognition: This patient is not capable of making decisions on her own behalf.   6. Skin/Wound Care: Routine skin care checks - coccyx PI with scab  - routine care - LAL mattress  - Dressings under PEG tube site to keep area dry, as appears moisture trapping under button 2/9   7. Fluids/Electrolytes/Nutrition: Routine Is and Os and follow-up chemistries             -NPO; has PEG             -continue Glucerna 1.5 continuous Tfs  - 2/9: Add potasium supplementation 20 meq BID; 2/11 dced  - 2/12: Add 40 meq daily for K 3.4 ;Repeat in 2-3 days   - Protein calorie malnutrition - on Juven BID              - Consult dietary to follow for titration - BG mildly elevated but in good range given age, monitor   Recent Labs    08/11/22 0734 08/11/22 1124 08/11/22 1630  GLUCAP 148* 147* 192*        8: Hypertension: monitor TID and prn             -continue amlodipine 10 mg daily             -losartan 50 mg daily -> Dced 2/3 d/t concern for angioedema               - Add hydralazine 10 mg PRN for SBP >180,  DBP>100    08/11/2022   12:59 PM 08/11/2022    4:47 AM 08/10/2022    7:21 PM  Vitals with BMI  Systolic 0000000 [id number]  Diastolic 75 77 68  Pulse 83 80 80   2/4: BP stable/high; mild tachycardia this AM; if AM labs do not show dehydration, would start HCTZ 12.5 mg daily for adjunctive control  2/5 BP with improvement over last 12 hours--will hold off on adding any further medication right now.  2/6 - mild HTN, no med changes, labs in AM  2/7 -   HTN remains mild; start hctz 6.325 mg per tube daily; labs in AM 2/9  2/9: DC HCTZ, switch to Coreg 3.125 BID, DC K supplement  2/12: Monitor, if consistently HTN can increase coreg  9: Hyperlipidemia: continue statin, Zetia   10: SDH/ICH s/p crani: Finished Keppra for sz ppx.    11: Leukocytosis: current temperature 99.3, ceftazidime completed 1/29 - resolved             -Follow-up CBC  with differential, mild WBC increase 2.2 likely d/t POD#1; downtrending 2/3               - Fever overnight prior to admission; POD from PEG, monitor for recurrence  - No fever recurrence 2/3; monitor  - 2/6: Labs in AM. CXR today for course lung sounds, and duplex for LUE swelling. Strict oral care QID    - 2/7: AM labs with mild subsegmental atelectasis on R; encourage ISB  -2/10 WBC 7.0 on 2/7, no signs of infection  12: Anemia, multifactorial:               -Follow-up CBC -hgb with sl decrease again today, normocytic  -check stool for OB  -PA added b-supp. Add Fe++ too  - 2/9: transfuse 1 unit for HgB 6.4 today. FOBT pending.   2/10 FOBT pos, recheck, recheck H&H tomorrow, consider GI consult  2/11 H/H stable, spoke with GI, no intervention at this time supportive care, GI ordered BMP,  re consult if HGB continues to decrease, appreciate assistance   2/12: HgB stable   13. ?Angioedema vs. Chlorhexadine reaction - improving             - Improving per family after DC chlorhexidine skin wipes and oral mouthwash              - Per SLP notes, interferes with  tongue movement, speaking             - Hx cough with Enalipril, Losartan increased from OP 25 mg to 50 mg 1/14, never weaned; edema started 1/30 per chart review             - d/w pharmacy 2/4 and agree that reaction can occur at any time, and may cross-react with ARB. Will attempt to titrate BP meds without ACE/ARBs and monitor swelling.   14. L hemineglect - causing R gaze preference and positional tone             - Repositioning in bed to straighten head--head more neutral today 2/5             - May need to consider botox given need to recover cognitive function and relatively little tone elsewhere  - 2/6: E stim and taping to L shoulder for mild sublux per OT.   15. Urinary incontinence  - Foley removed 2/6  - PVRs QID and ISC for >350 ccs retention  - 2/7: 1x PVR low; monitor; ? Incontinence vs mixed, will consider timed toiletting after more information gathered  - 2/12: Start timed toiletting Q4H for urgency  16. Dysphagia, s/p PEG placement.   - Remains NPO  - Strict oral care QID   - CXR today given course breath sounds, weak cough, poor secretion clearance - R subsegmental atelectatis, encourage ISB  17. Diarrhea - stable/improving  - loose stools likely d/t tube feeds; on fibercon; will add immodium for >2 diarrhea BM in 24 hours  - type 6 stool today x1 appears to be improving  - LBM 2/12, mushy  LOS: 9 days A FACE TO FACE EVALUATION WAS PERFORMED  Gertie Gowda 08/11/2022, 5:25 PM

## 2022-08-11 NOTE — Progress Notes (Signed)
Occupational Therapy Weekly Progress Note  Patient Details  Name: Gina Kelley MRN: [id number] Date of Birth: [birth year]  Beginning of progress report period: August 03, 2022 End of progress report period: August 11, 2022  Today's Date: 08/11/2022 OT Individual Time: [id number] OT Individual Time Calculation (min): 55 min    Patient has met 2 of 5 short term goals.  Patient is making slow progress towards OT goals. We have been working on slideboard and Stedy transfers but she still needs max/total A +2 for all transfers. Her overall sitting balance has improved to mostly min A at EOB. She continues to have difficulty processing and motor planning BADL tasks  requiring max/total A for all BADLs. Continue current POC for now.   Patient continues to demonstrate the following deficits: muscle weakness, decreased cardiorespiratoy endurance, impaired timing and sequencing, abnormal tone, unbalanced muscle activation, motor apraxia, ataxia, decreased coordination, and decreased motor planning, decreased midline orientation, decreased attention to left, left side neglect, decreased motor planning, and ideational apraxia, decreased initiation, decreased attention, decreased awareness, decreased problem solving, decreased safety awareness, decreased memory, and delayed processing, and decreased sitting balance, decreased standing balance, decreased postural control, hemiplegia, and decreased balance strategies and therefore will continue to benefit from skilled OT intervention to enhance overall performance with BADL and Reduce care partner burden.  Patient progressing toward long term goals..  Continue plan of care.  OT Short Term Goals Week 1:  OT Short Term Goal 1 (Week 1): Pt will complete UB bathing with Max A seated on EOB or at sink. OT Short Term Goal 1 - Progress (Week 1): Met OT Short Term Goal 2 (Week 1): Pt will complete LB bating with Max A seated on EOB or at sink. OT Short Term  Goal 2 - Progress (Week 1): Progressing toward goal OT Short Term Goal 3 (Week 1): Pt will increase bed mobility while transitioning to seated on EOB from supine with Max A. OT Short Term Goal 3 - Progress (Week 1): Progressing toward goal OT Short Term Goal 4 (Week 1): Pt will increase static sitting balance to [employer] while completing functional self care task. OT Short Term Goal 4 - Progress (Week 1): Met OT Short Term Goal 5 (Week 1): Pt will increase attention to her immediate environment by locating all items needed for grooming task when placed to her left. OT Short Term Goal 5 - Progress (Week 1): Progressing toward goal Week 2:  OT Short Term Goal 1 (Week 2): Pt will increase bed mobility while transitioning to seated on EOB from supine with Max A. OT Short Term Goal 2 (Week 2): Pt will increase attention to her immediate environment by locating 2 items needed for grooming task when placed to her left. OT Short Term Goal 3 (Week 2): Patient will complete functional transfer with LRAD and Max A of 1 person  Skilled Therapeutic Interventions/Progress Updates:    Pt greeted semi-reclined in bed with nursing administering meds via PEG. Pt incontinent of loose BM and bladder. Total A +2 rolling for total A peri-care, brief chage, and to don pants. Pt completed bed mobility with total A of 1. Worked on sitting balance at EOB with pt able to maintain sitting with mostly min A and intermittent bouts of close supervision. OT played pt perferred gospel music and she was able to sing a few lyrics to the song. Stedy transfer to the TIS wc with max A +2 and poor initiation into standing. Max cues and  hand over hand A to initiate washing upper body and face. Max A for UB dressing. Pt left seated in TIS wc at end of session with alarm belt and touch call bell in reach. Requested nursing get pt back to bed after SLP session to allow time to rest prior to PT session in the afternoon.   Therapy  Documentation Precautions:  Precautions Precautions: Fall Precaution Comments: peg tube. HOB no lower than 30*. Left dense hemi. right gaze preference, left side inattention Required Braces or Orthoses: Other Brace Other Brace: PRAFO boots on in bed Restrictions Weight Bearing Restrictions: No Pain: Pain Assessment Pain Scale: 0-10 Pain Score: 0-No pain   Therapy/Group: Individual Therapy  Valma Cava 08/11/2022, 10:16 AM

## 2022-08-11 NOTE — Progress Notes (Signed)
Speech Language Pathology Weekly Progress and Session Note  Patient Details  Name: GABREIL AMSBAUGH MRN: [id number] Date of Birth: [birth year]  Beginning of progress report period: August 02, 2022 End of progress report period: August 11, 2022  Today's Date: 08/11/2022 SLP Individual Time: 1000-1055 SLP Individual Time Calculation (min): 55 min  Short Term Goals: Week 1: SLP Short Term Goal 1 (Week 1): Pt will consume therapeutic trials of POs with small ice chips with max assist to contain boluses orally and initiate swallow. SLP Short Term Goal 1 - Progress (Week 1): Not met SLP Short Term Goal 2 (Week 1): Pt will locate objects at midline during functional tasks in 50% of opportunities with max assist multimodal cues. SLP Short Term Goal 2 - Progress (Week 1): Met SLP Short Term Goal 3 (Week 1): Pt will use overarticulation and increased vocal intensity to achieve intelligibility at the single phoneme level with max assist multimodal cues. SLP Short Term Goal 3 - Progress (Week 1): Met SLP Short Term Goal 4 (Week 1): Pt will sustain her attention to functional tasks for ~3 minute intervals with max cues for redirection. SLP Short Term Goal 4 - Progress (Week 1): Met SLP Short Term Goal 5 (Week 1): Pt will communicate basic, immediate wants and needs via multiple modalities with max assist multimodal cues. SLP Short Term Goal 5 - Progress (Week 1): Met    New Short Term Goals: Week 2: SLP Short Term Goal 1 (Week 2): Pt will communicate basic, immediate wants and needs via multimodal communication with mod assist multimodal cues. SLP Short Term Goal 2 (Week 2): Pt will sustain her attention to functional tasks for ~5 minute intervals with max cues for redirection. SLP Short Term Goal 3 (Week 2): Pt will use overarticulation and increased vocal intensity to achieve intelligibility at the word level with max assist multimodal cues. SLP Short Term Goal 4 (Week 2): Pt will locate objects  at midline during functional tasks in 75% of opportunities with max assist multimodal cues. SLP Short Term Goal 5 (Week 2): Pt will consume therapeutic trials of POs with small ice chips with max assist to contain boluses orally and initiate swallow in 75% of opportunities with minimal overt s/s of aspiration.  Weekly Progress Updates: Patient has made slow but functional gains this reporting period. Currently, patient remains NPO and continues to demonstrate severe deficits in oral-motor ROM and strength impacting bolus containment and manipulation. Patient has been participating in limited trials of small ice chips with overall Max-Total A needed for bolus containment. Recommend patient ongoing trials with SLP. Patient continues to demonstrate a severe dysarthria and requires Max A multimodal cues for use of speech intelligibility strategies at the word level to achieve ~50% intelligibility and for use of multimodal communication to assist with communication breakdowns. Max verbal cues are also needed for sustained attention to task and for attention to midline. Patient and family education ongoing. Patient would benefit from continued skilled SLP intervention to maximize her swallowing and cognitive functioning as well as her functional communication prior to discharge.      Intensity: Minumum of 1-2 x/day, 30 to 90 minutes Frequency: 3 to 5 out of 7 days Duration/Length of Stay: 08/27/22 Treatment/Interventions: Cognitive remediation/compensation;Cueing hierarchy;Internal/external aids;Patient/family education;Functional tasks;Environmental controls;Speech/Language facilitation;Dysphagia/aspiration precaution training;Therapeutic Activities;Multimodal communication approach   Daily Session  Skilled Therapeutic Interventions: Skilled treatment session focused on speech and swallowing goals. Upon arrival, patient was awake while upright in the wheelchair. Patient attempting to communicate with  gestures and finger spelling in the air. SLP encouraged patient to utilize verbal and written expression for communication. Patient unable to utilize written expression at the word level today with minimal awareness noted. Patient then attempted to communicate at the phrase level with Max A multimodal cues needed for use of single words, an increased vocal intensity, and over articulation to achieve ~50% intelligibility. SLP provided thorough oral care and patient consumed small, single ice chips. Patient required Max verbal for a tight lip seal around the spoon and max-total A to keep her oral cavity closed in order to reduce anterior spillage. Max verbal cues were also needed for lingual movement/oral manipulation with tongue thrusting noted. Patient initiated a swallow in 100% of trials but appeared delayed. No overt s/s of aspiration noted. Recommend patient remain NPO. Patient also participated in functional oral motor movements with use of a spoon with Max verbal cues needed for lingual movement has patient compensated by moving her head for compensation. Patient left upright in wheelchair with alarm on and all needs within reach. Continue with current plan of care.     Pain No/Denies Pain   Therapy/Group: Individual Therapy  Bernyce Brimley, [place] 08/11/2022, 3:59 PM

## 2022-08-12 LAB — GLUCOSE, CAPILLARY
Glucose-Capillary: 150 mg/dL — ABNORMAL HIGH (ref 70–99)
Glucose-Capillary: 135 mg/dL — ABNORMAL HIGH (ref 70–99)
Glucose-Capillary: 155 mg/dL — ABNORMAL HIGH (ref 70–99)
Glucose-Capillary: 157 mg/dL — ABNORMAL HIGH (ref 70–99)
Glucose-Capillary: 150 mg/dL — ABNORMAL HIGH (ref 70–99)

## 2022-08-12 MED ORDER — CARVEDILOL 6.25 MG PO TABS
6.2500 mg | ORAL_TABLET | Freq: Two times a day (BID) | ORAL | Status: DC
  Administered 2022-08-12 – 2022-08-22 (×20): 6.25 mg
  Filled 2022-08-12 (×20): qty 1

## 2022-08-12 NOTE — Progress Notes (Signed)
Patient ID: Gina Kelley, female   DOB: 1935-07-14, 87 y.o.   MRN: [id number]  SW went by pt room and no family in room. SW will follow-up with her dtr Francesca Jewett.  1618- pt dtr Francesca Jewett here in the hospital. SW will go down to visit.   Loralee Pacas, MSW, [place] Office: [phone] Cell: [phone] Fax: [phone]

## 2022-08-12 NOTE — Progress Notes (Signed)
Timed toileting Q4 hr per order attempted, however, each attempt pt was already soiled, with no further results.

## 2022-08-12 NOTE — Progress Notes (Signed)
Physical Therapy Session Note  Patient Details  Name: Gina Kelley MRN: [id number] Date of Birth: [birth year]  Today's Date: 08/12/2022 PT Individual Time: [id number] PT Individual Time Calculation (min): 40 min   Short Term Goals: Week 2:  PT Short Term Goal 1 (Week 2): Patient will perform bed/chair transfer with LRAD and ModA PT Short Term Goal 2 (Week 2): Patient will maintain dynamic sitting balance with CGA PT Short Term Goal 3 (Week 2): Patient will initiate sit/stand with LRAD  Skilled Therapeutic Interventions/Progress Updates:    Chart reviewed and pt agreeable to therapy. Pt received semi-reclined in bed with no c/o pain. Session focused on sitting balance and functional transfers to progress towards assisted transfers needed for home access. Pt initiated session with transfer to EOB using MinA + strong verbal cues to get legs off edge and then MaxA to position safely at EOB. Pt then practiced forward leaning and positioning in STEDY. Once ready, pt completed partial stand with MaxA +2. Pt then rested and pt reviewed advice for using RUE to assist stand. Pt then completed stand in STEDY with ModA +2. From STEDY, pt displayed limited awareness of postural safety and required MaxA for sitting balance. Pt was able to recover sitting balance with totalA to position and VC to maintain posture with continued MinA. Pt then completed stand in STEDY with modA +2. Pt returned to bed with MaxA +2 and completed multiple R/L rolls with ModA for postioning. In bed, pt completed AROM DF, SLR and SAQ for 2x10. At end of session, pt was left semi-reclined in bed with alarm engaged, nurse call bell and all needs in reach.     Therapy Documentation Precautions:  Precautions Precautions: Fall Precaution Comments: peg tube. HOB no lower than 30*. Left dense hemi. right gaze preference, left side inattention Required Braces or Orthoses: Other Brace Other Brace: PRAFO boots on in  bed Restrictions Weight Bearing Restrictions: No      Therapy/Group: Individual Therapy  Marquette Old, PT, DPT 08/12/2022, 2:25 PM

## 2022-08-12 NOTE — Progress Notes (Signed)
PROGRESS NOTE   Subjective/Complaints:  No events overnight.  Patient already soiled with all timed toiletting attempts. Sitting balance is improving, however core strength and head positioning remains very challenging. She is Max A for most mobility.   LBM 2/12, liquid, incontinence.   ROS: limited due to language/communication + PEG, L shoulder, bilateral knee pain - improved  Objective:   No results found. Recent Labs    08/10/22 1024 08/11/22 0838  WBC  --  6.4  HGB 8.0* 8.5*  HCT 25.9* 26.3*  PLT  --  285    Recent Labs    08/10/22 1140 08/11/22 0838  NA 134* 135  K 3.6 3.4*  CL 94* 95*  CO2 29 34*  GLUCOSE 158* 160*  BUN 35* 31*  CREATININE 0.60 0.57  CALCIUM 8.2* 8.2*     Intake/Output Summary (Last 24 hours) at 08/12/2022 0914 Last data filed at 08/11/2022 1231 Gross per 24 hour  Intake 50 ml  Output --  Net 50 ml      Pressure Injury Sacrum Mid Stage 2 -  Partial thickness loss of dermis presenting as a shallow open injury with a red, pink wound bed without slough. Breakdown of skin over dermis area over bony prominence mid-area of sacrum (Active)     Location: Sacrum  Location Orientation: Mid  Staging: Stage 2 -  Partial thickness loss of dermis presenting as a shallow open injury with a red, pink wound bed without slough.  Wound Description (Comments): Breakdown of skin over dermis area over bony prominence mid-area of sacrum  Present on Admission:      Physical Exam: Vital Signs Blood pressure (!) 152/73, pulse 81, temperature 98.4 F (36.9 C), resp. rate 15, height 5' 3"$  (1.6 m), weight 50.8 kg, SpO2 97 %.  Constitutional: No distress . Vital signs reviewed. Appears comfortable in bed HEENT: NCAT, lip swelling, particular lower - worse today Neck: supple, forward flexed.  Cardiovascular: RRR without murmur. No JVD    Respiratory/Chest: CTAB. No RRW, Normal effort.  GI/Abdomen:  Normal active bowel sounds, soft, nontender +PEG  Ext: no clubbing, cyanosis. LUE 1+ Edema - improving Psych: pleasant and cooperative  Skin: C/D/I. No apparent lesions.   c/d/I. +Peg + Sacral wound  - scabbed  MSK:      1/2 fingerbreadth subluxation L shoulder      Strength: unchanged 2/6                LUE: Flaccid                 LLE:  antigravity   Neurologic exam:  Cognition: AAO to person, place, and time. Follows simple commands, reacts to verbal input. Improving dysarthric verbalizations - slower initiation today   Insight: Fair insight into current condition. Knows she had a stroke.   + LUE Hoffman's; +L facial droop. + L hemineglect MUCH improved, eyes can now pass midline. + L shoulder weakness--no change Spasticity: mild tone in neck, MAS 1  BL foot clonus with intention.   Assessment/Plan: 1. Functional deficits which require 3+ hours per day of interdisciplinary therapy in a comprehensive inpatient rehab setting. Physiatrist is providing close team  supervision and 24 hour management of active medical problems listed below. Physiatrist and rehab team continue to assess barriers to discharge/monitor patient progress toward functional and medical goals  Care Tool:  Bathing    Body parts bathed by patient: Face   Body parts bathed by helper: Left lower leg, Right lower leg, Left upper leg, Right upper leg, Buttocks, Front perineal area, Abdomen, Chest, Left arm, Right arm     Bathing assist Assist Level: Total Assistance - Patient < 25%     Upper Body Dressing/Undressing Upper body dressing   What is the patient wearing?: Pull over shirt    Upper body assist Assist Level: Total Assistance - Patient < 25%    Lower Body Dressing/Undressing Lower body dressing      What is the patient wearing?: Pants, Incontinence brief     Lower body assist Assist for lower body dressing: Dependent - Patient 0%     Toileting Toileting    Toileting assist Assist for  toileting: Dependent - Patient 0%     Transfers Chair/bed transfer  Transfers assist  Chair/bed transfer activity did not occur: Safety/medical concerns (unable 2/2 pain, fatigue, weakness)  Chair/bed transfer assist level: 2 Helpers     Locomotion Ambulation   Ambulation assist   Ambulation activity did not occur: Safety/medical concerns (unable 2/2 pain, fatigue, weakness)          Walk 10 feet activity   Assist  Walk 10 feet activity did not occur: Safety/medical concerns        Walk 50 feet activity   Assist Walk 50 feet with 2 turns activity did not occur: Safety/medical concerns         Walk 150 feet activity   Assist Walk 150 feet activity did not occur: Safety/medical concerns         Walk 10 feet on uneven surface  activity   Assist Walk 10 feet on uneven surfaces activity did not occur: Safety/medical concerns         Wheelchair     Assist Is the patient using a wheelchair?: Yes Type of Wheelchair: Manual Wheelchair activity did not occur: Safety/medical concerns (unable 2/2 pain, fatigue, weakness)    Max wheelchair distance: 0    Wheelchair 50 feet with 2 turns activity    Assist    Wheelchair 50 feet with 2 turns activity did not occur: Safety/medical concerns       Wheelchair 150 feet activity     Assist  Wheelchair 150 feet activity did not occur: Safety/medical concerns       Blood pressure (!) 152/73, pulse 81, temperature 98.4 F (36.9 C), resp. rate 15, height 5' 3"$  (1.6 m), weight 50.8 kg, SpO2 97 %.    Medical Problem List and Plan: 1. Functional deficits secondary to subdural hematoma and ICH             - patient may shower with PEG site covered             - ELOS/Goals: 14-16 days, mod assist PT, mod assist OT, mod assist SLP  - goal DC 2/28, will discuss Dispo plan with family this week              - Does well communicating with writing board and fingers for y/n             - Family  requests change from DNR to full code on admission; changed   - Continue CIR therapies including PT, OT,  and SLP    - 2/6 ordered L PRAFO, WHO, and soft cervical collar to help with head positioning OOB  -Able to stand x2 for 5 min with PT  2.  Antithrombotics: -DVT/anticoagulation:  Mechanical:  Antiembolism stockings, knee (TED hose) Bilateral lower extremities; add SCDs             -antiplatelet therapy: none   - Duplex LUE pending - 2/8 superficial vein thrombosis involving the left cephalic vein-- warm compresses and conservative treatment   3. Pain Management: Tylenol as needed  - improved   - 2/6: lidocaine patch to L shoulder for pain - benefitting    - 2/9: voltaren gel to L shoulder, b/l knees  4. Mood/Behavior/Sleep: LCSW to evaluate and provide emotional support             -antipsychotic agents: n/a   5. Neuropsych/cognition: This patient is not capable of making decisions on her own behalf.   6. Skin/Wound Care: Routine skin care checks - coccyx PI with scab  - routine care - LAL mattress  - Dressings under PEG tube site to keep area dry, as appears moisture trapping under button 2/9 - improved   7. Fluids/Electrolytes/Nutrition: Routine Is and Os and follow-up chemistries             -NPO; has PEG             -continue Glucerna 1.5 continuous Tfs  - 2/9: Add potasium supplementation 20 meq BID; 2/11 dced  - 2/12: Add 40 meq daily for K 3.4 ;Repeat in 2-3 days   - Protein calorie malnutrition - on Juven BID              - Consult dietary to follow for titration - BG mildly elevated but in good range given age, monitor   Recent Labs    08/11/22 2343 08/12/22 0414 08/12/22 0812  GLUCAP 156* 150* 135*        8: Hypertension: monitor TID and prn             -continue amlodipine 10 mg daily             -losartan 50 mg daily -> Dced 2/3 d/t concern for angioedema               - Add hydralazine 10 mg PRN for SBP >180, DBP>100    08/12/2022    5:00 AM 08/12/2022     3:23 AM 08/11/2022    8:00 PM  Vitals with BMI  Weight 112 lbs    BMI [id number]    Systolic  [id number]  Diastolic  73 74  Pulse  81 85   2/4: BP stable/high; mild tachycardia this AM; if AM labs do not show dehydration, would start HCTZ 12.5 mg daily for adjunctive control  2/5 BP with improvement over last 12 hours--will hold off on adding any further medication right now.  2/6 - mild HTN, no med changes, labs in AM  2/7 -   HTN remains mild; start hctz 6.325 mg per tube daily; labs in AM 2/9  2/9: DC HCTZ, switch to Coreg 3.125 BID, DC K supplement  2/12: Monitor, if consistently HTN can increase coreg  2/13: increase to 6.5 mg BID  9: Hyperlipidemia: continue statin, Zetia   10: SDH/ICH s/p crani: Finished Keppra for sz ppx.    11: Leukocytosis: current temperature 99.3, ceftazidime completed 1/29 - resolved             -  Follow-up CBC with differential, mild WBC increase 2.2 likely d/t POD#1; downtrending 2/3               - Fever overnight prior to admission; POD from PEG, monitor for recurrence  - No fever recurrence 2/3; monitor  - 2/6: Labs in AM. CXR today for course lung sounds, and duplex for LUE swelling. Strict oral care QID    - 2/7: AM labs with mild subsegmental atelectasis on R; encourage ISB  -2/10 WBC 7.0 on 2/7, no signs of infection  12: Anemia, multifactorial:               -Follow-up CBC -hgb with sl decrease again today, normocytic  -check stool for OB  -PA added b-supp. Add Fe++ too  - 2/9: transfuse 1 unit for HgB 6.4 today. FOBT pending.   2/10 FOBT pos, recheck, recheck H&H tomorrow, consider GI consult  2/11 H/H stable, spoke with GI, no intervention at this time supportive care, GI ordered BMP,  re consult if HGB continues to decrease, appreciate assistance   2/12: HgB stable   13. ?Angioedema vs. Chlorhexadine reaction - improving             - Improving per family after DC chlorhexidine skin wipes and oral mouthwash              - Per SLP notes,  interferes with tongue movement, speaking             - Hx cough with Enalipril, Losartan increased from OP 25 mg to 50 mg 1/14, never weaned; edema started 1/30 per chart review             - d/w pharmacy 2/4 and agree that reaction can occur at any time, and may cross-react with ARB. Will attempt to titrate BP meds without ACE/ARBs and monitor swelling.   14. L hemineglect - causing R gaze preference and positional tone             - Repositioning in bed to straighten head--head more neutral today 2/5             - May need to consider botox given need to recover cognitive function and relatively little tone elsewhere  - 2/6: E stim and taping to L shoulder for mild sublux per OT.   15. Urinary incontinence  - Foley removed 2/6  - PVRs QID and ISC for >350 ccs retention  - 2/7: 1x PVR low; monitor; ? Incontinence vs mixed, will consider timed toiletting after more information gathered  - 2/12: Start timed toiletting Q4H for urgency  16. Dysphagia, s/p PEG placement.   - Remains NPO  - Strict oral care QID   - CXR today given course breath sounds, weak cough, poor secretion clearance - R subsegmental atelectatis, encourage ISB  17. Diarrhea - stable/improving  - loose stools likely d/t tube feeds; on fibercon; will add immodium for >2 diarrhea BM in 24 hours  - type 6 stool today x1 appears to be improving  - LBM 2/12, mushy  LOS: 10 days A FACE TO FACE EVALUATION WAS PERFORMED  Gertie Gowda 08/12/2022, 9:14 AM

## 2022-08-12 NOTE — Progress Notes (Signed)
Occupational Therapy Session Note  Patient Details  Name: BRAYLEA ALTERMAN MRN: [id number] Date of Birth: 21-Jan-1936  Today's Date: 08/12/2022 Session 1 OT Individual Time: [id number] OT Individual Time Calculation (min): 54 min   Session 2 OT Individual Time: 1433-1450 OT Individual Time Calculation (min): 17 min    Short Term Goals: Week 2:  OT Short Term Goal 1 (Week 2): Pt will increase bed mobility while transitioning to seated on EOB from supine with Max A. OT Short Term Goal 2 (Week 2): Pt will increase attention to her immediate environment by locating 2 items needed for grooming task when placed to her left. OT Short Term Goal 3 (Week 2): Patient will complete functional transfer with LRAD and Max A of 1 person  Skilled Therapeutic Interventions/Progress Updates:    Session 1 Pt greeted semi-reclined in bed asleep, a little more lethargic than yesterday. Pt incontinent of urine wth smear of BM. Total A +2 rolling for peri-care and brief change. Pt initiated bed mobility a little more today with max A of 1. Improved sitting balance at EOB with min A overall and intermittent close supervision. Slideboard transfer from bed to TIS wc with max A +2. UB bathing/dressing at the sink with max A but improvde initiation to wash face and upper body when instructed. OT placed tubgrip on L UE for edema management. Pt left seated in TIS wc with alarm belt on, call touch light in reach and needs met.   Session 2 Pt greeted semi-reclined in bed asleep. Pt lethargic and did not want to wake up. OT provided skin check o=under tubgrip with improved edema overall. Attempted e-stim on L wrist extensors but unable to get a muscle contraction. OT provided gente stretching and ROM to LUE. Pt left semi-reclined in bed with needs met and nursing present.   Therapy Documentation Precautions:  Precautions Precautions: Fall Precaution Comments: peg tube. HOB no lower than 30*. Left dense hemi. right gaze  preference, left side inattention Required Braces or Orthoses: Other Brace Other Brace: PRAFO boots on in bed Restrictions Weight Bearing Restrictions: No Pain:  Denies pain   Therapy/Group: Individual Therapy  Valma Cava 08/12/2022, 3:17 PM

## 2022-08-12 NOTE — Plan of Care (Signed)
Wound Plan   Braden Score: 13  Sensory: 3  Moisture: 2  Activity: 2  Mobility: 2  Nutrition: 3  Friction: 1   Wounds present:   Pressure Injury 07/17/22: Stage II Left Sacrum  POA   Interventions:   Air Mattress Glucerna 1.5 Registered Dietician Roho Cushion Ask PA for zinc and other supplements to increase protein for wound healing.   Attendees/Contributors  Tacy Learn, RN Peri Jefferson, LPN Erasmo Score, OT [facility], PT [place], [place]       08/12/22 Wound has been healed with current interventions. Wound  plan discontinued universal precautions recommended for for futher plan of care Josh, RN WTA-C

## 2022-08-12 NOTE — Progress Notes (Signed)
Nutrition Follow-up  DOCUMENTATION CODES:   Non-severe (moderate) malnutrition in context of chronic illness  INTERVENTION:  -Continue Glucerna 1.5 at 55 mL/hour -When runs for 20 hours daily will provide: 1650 kcal, 91 grams of protein, 880 mL H2O daily   With free water flush of 200 mL every 6 hours, pt receiving a total of 1680 mL H2O daily including water in tube feeds.   Continue Juven 1 packet per tube BID. Each packet provides 95 kcal, 7 grams L-Arginine, 7 grams L-Glutamine, 2.5 grams collagen protein, 300 mg vitamin C, 9.5 mg zinc, and other micronutrients essential for wound healing.  NUTRITION DIAGNOSIS:   Moderate Malnutrition related to chronic illness as evidenced by moderate fat depletion, moderate muscle depletion, severe muscle depletion.  GOAL:   Patient will meet greater than or equal to 90% of their needs  MONITOR:   Labs, Weight trends, TF tolerance, Skin, I & O's  REASON FOR ASSESSMENT:   Consult Enteral/tube feeding initiation and management, Assessment of nutrition requirement/status, Wound healing  ASSESSMENT:   87 year old female with PMHx of DM, HTN, HLD, CKD admitted with ICH and SDH s/p craniotomy.  Meds reviewed: Vit C, lipitor, pepcid, ferrous sulfate, sliding scale insulin, fibercon, klor-con, zinc sulfate. Labs reviewed: K low.    Pt is NPO with G-J tube. TF running as ordered at time of assessment. RN reports that the pt is tolerating TF well with no issues. RD will continue to monitor TF tolerance. New weight has been obtained at 50.8 kg, RD will continue to monitor weights and adjust TF if needed at follow up.   Diet Order:   Diet Order             Diet NPO time specified  Diet effective now                   EDUCATION NEEDS:   Not appropriate for education at this time  Skin:  Skin Assessment: Skin Integrity Issues: Skin Integrity Issues:: Stage II, Incisions Stage II: sacrum Incisions: closed incision to head  Last BM:   2/12  Height:   Ht Readings from Last 1 Encounters:  08/02/22 5' 3"$  (1.6 m)    Weight:   Wt Readings from Last 1 Encounters:  08/12/22 50.8 kg    Ideal Body Weight:  52.3 kg  BMI:  Body mass index is 19.84 kg/m.  Estimated Nutritional Needs:   Kcal:  1500-1700  Protein:  75-90 grams  Fluid:  > 1.5 L/day  Gina Kelley, RD, LDN, CNSC.

## 2022-08-12 NOTE — Progress Notes (Signed)
Physical Therapy Session Note  Patient Details  Name: BRUCIE ROMANOSKI MRN: [id number] Date of Birth: 24-Aug-1935  Today's Date: 08/12/2022 PT Individual Time: 1105-1210 PT Individual Time Calculation (min): 65 min   Short Term Goals: Week 2:  PT Short Term Goal 1 (Week 2): Patient will perform bed/chair transfer with LRAD and ModA PT Short Term Goal 2 (Week 2): Patient will maintain dynamic sitting balance with CGA PT Short Term Goal 3 (Week 2): Patient will initiate sit/stand with LRAD  Skilled Therapeutic Interventions/Progress Updates:  Patient greeted sitting in TIS wheelchair and agreeable to PT treatment session. Patient wheeled to rehab gym for time management and energy conservation.   Therapist lifted patient's bottom in order for rehab tech to place standing frame sling under her bottom/hips with total assist. Patient tolerated the standing frame x14' and x8' while therapist provided lateral and anterior support to L UE/chest for improved standing balance. Patient tasked with reaching outside her BOS to the L and R in order to improve visual scanning/tracking, as well as R lateral weight shift for improved midline posturing. Patient was able to accurately name all 5 colors of the bean bags during each activity and only confused red/orange once. Multimodal cues for improved postural extension, forward gaze and left lateral head rotation. Patient demonstrated improved initiation, motor planning, L attention and overall tolerance of standing frame compared to last time she participated in the standing frame.   Patient returned to her room where patient performed a squat pivot transfer from [place] wheelchair to sitting EOB with MaxA from therapist. Patient transitioned to supine with MaxA for B LE management and trunk support, however patient was able to transition onto her R elbow in preparation for bed mobility. Patient noted to be soiled of urine and bowels- Patient rolled left and right  various times with Mod/MaxA. NT and therapist performed pericare and donned new brief. Patient left supine in bed (HOB >30 degrees) with L UE propped on pillow, B heels floated, call bell within reach and all needs met. Tube feed re-started prior to therapist exiting the room.    Therapy Documentation Precautions:  Precautions Precautions: Fall Precaution Comments: peg tube. HOB no lower than 30*. Left dense hemi. right gaze preference, left side inattention Required Braces or Orthoses: Other Brace Other Brace: PRAFO boots on in bed Restrictions Weight Bearing Restrictions: No   Therapy/Group: Individual Therapy  Shelvy Perazzo 08/12/2022, 7:53 AM

## 2022-08-12 NOTE — Progress Notes (Signed)
Occupational Therapy Session Note  Patient Details  Name: Gina Kelley MRN: [id number] Date of Birth: February 03, 1936  Today's Date: 08/12/2022 OT Individual Time: 0900-0930 OT Individual Time Calculation (min): 30 min    Short Term Goals: Week 1:  OT Short Term Goal 1 (Week 1): Pt will complete UB bathing with Max A seated on EOB or at sink. OT Short Term Goal 1 - Progress (Week 1): Met OT Short Term Goal 2 (Week 1): Pt will complete LB bating with Max A seated on EOB or at sink. OT Short Term Goal 2 - Progress (Week 1): Progressing toward goal OT Short Term Goal 3 (Week 1): Pt will increase bed mobility while transitioning to seated on EOB from supine with Max A. OT Short Term Goal 3 - Progress (Week 1): Progressing toward goal OT Short Term Goal 4 (Week 1): Pt will increase static sitting balance to [employer] while completing functional self care task. OT Short Term Goal 4 - Progress (Week 1): Met OT Short Term Goal 5 (Week 1): Pt will increase attention to her immediate environment by locating all items needed for grooming task when placed to her left. OT Short Term Goal 5 - Progress (Week 1): Progressing toward goal  Skilled Therapeutic Interventions/Progress Updates:     Pt received in TIS with no pain  Therapeutic exercise PROM to cervical spine to improve ROM needed for head control and decrease torticollis. Pt provided  min-mod manual pressure to achieve slightly past midline lateral flexion and rotation while RN is providing medications.    Therapeutic activity BITS activities completed seated in TIS for L attention needing max multi modal cues for L attention. Pt requires 45 seconds per letter for A-G  Pt left at end of session in TIS with exit alarm on, call light in reach and all needs met   Therapy Documentation Precautions:  Precautions Precautions: Fall Precaution Comments: peg tube. HOB no lower than 30*. Left dense hemi. right gaze preference, left side  inattention Required Braces or Orthoses: Other Brace Other Brace: PRAFO boots on in bed Restrictions Weight Bearing Restrictions: No General:   Vital Signs:  Pain:   ADL: ADL Eating: NPO Grooming: Dependent Where Assessed-Grooming: Wheelchair Upper Body Bathing: Dependent Where Assessed-Upper Body Bathing: Sitting at sink, Wheelchair, Bed level Lower Body Bathing: Dependent Where Assessed-Lower Body Bathing: Bed level Upper Body Dressing: Dependent Where Assessed-Upper Body Dressing: Wheelchair, Sitting at sink, Bed level Lower Body Dressing: Dependent Where Assessed-Lower Body Dressing: Bed level Toileting: Dependent Where Assessed-Toileting: Bed level Toilet Transfer: Dependent Toilet Transfer Method: Squat pivot Tub/Shower Transfer: Not assessed Social research officer, government: Not assessed Vision   Perception    Praxis   Balance   Exercises:   Other Treatments:     Therapy/Group: Individual Therapy  Tonny Branch 08/12/2022, 8:50 AM

## 2022-08-12 NOTE — Patient Care Conference (Signed)
Inpatient RehabilitationTeam Conference and Plan of Care Update Date: 08/12/2022   Time: 10:53 AM    Patient Name: Gina Kelley      Medical Record Number: [id number]  Date of Birth: 12/23/1935 Sex: Female         Room/Bed: 4W23C/4W23C-01 Payor Info: Payor: [employer] MEDICARE / Plan: HU[employer] MEDICARE HMO / Product Type: *No Product type* /    Admit Date/Time:  08/02/2022  1:50 PM  Primary Diagnosis:  Subdural hematoma [facility])  Hospital Problems: Principal Problem:   Subdural hematoma ([employer]) Active Problems:   Mild malnutrition [facility])    Expected Discharge Date: Expected Discharge Date: 08/27/22  Team Members Present: Physician leading conference: Dr. Durel Salts Social Worker Present: Loralee Pacas, [place]uel Nurse Present: Tacy Learn, RN PT Present: Terence Lux, PT OT Present: Cherylynn Ridges, OT SLP Present: Weston Anna, SLP PPS Coordinator present : Gunnar Fusi, SLP     Current Status/Progress Goal Weekly Team Focus  Bowel/Bladder   incontinent of b/b, Timed toileting Q4 hr.   Regian some continence of b/b   Toilet q4 per order.    Swallow/Nutrition/ Hydration   NPO with PEG, Max A for oral-motor movement   Mod A  trials with SLP, functional use/improved strength of oral-motor musculature    ADL's                Mobility   Mod/MaxA for bed mobility, MaxA for transfers via squat pivot, SBA/MinA for dynamic and static sitting balance with L lateral and posterior lean secondary to pushing   MinA with LRAD for functional mobility and Supv for wheelchair mobility  L LE NMR, midline posturing, dynamic sitting balance, bed mobility, transfers, L visual field/tracking, sit/stands, discharge planning    Communication   Max A   Mod A   speech intelligibility, use of multimodal communication during communication breakdowns    Safety/Cognition/ Behavioral Observations  Max A   Mod A   sustained attention, visual scanning, awareness    Pain   Difficult  to assess pain level. No prns currently needed   Pain <3/10   Assess Qshift and prn    Skin   healing stage 2 to buttocks. Peg tube to abdomen.   Promote healing. Maintain remainder of skin  Assess Qshift and prn      Discharge Planning:  D/c remains pending at this time as family is working towards establishing 24/7 care. SW provided family with sitter list for further review. At this, discharge plan appears to be intial support at discharge for 24/7 care, however, does not appear to be concrete plan. SW will confirm there are no barriers to discharge.   Team Discussion: SDH. Incontinent B/B with time voiding. Denies pain. Healing stage II to sacrum. Currently on air mattress with wound plan in place. NPO with PEG tube. Transition to bolus feeds. Loose stools most likely d/t tube feeds. Attending better to left side. Adjusting B/P medications. Monitoring facial edema. Daily weight. Family aware that will need 24/7 care. Currently no D/C plan with sitter list provided. Need family meeting. Not making tangible progress with therapies.   Patient on target to meet rehab goals: no, MaxA for oral motor movement. Mod/MaxA for bed mobility. MaxA for transfers via SP. SBA/MinA for static sitting balance with left lateral and posterior lean d/t pushing. MaxA for communication.   *See Care Plan and progress notes for long and short-term goals.   Revisions to Treatment Plan:  Monitor labs, monitor daily weight, monitor facial edema,  blood sugar, family meeting. Teaching Needs: Medications, safety, self care, skin care, transfer training, bolus feeds, etc.   Current Barriers to Discharge: Decreased caregiver support, Home enviroment access/layout, Incontinence, Wound care, Lack of/limited family support, Behavior, and Nutritional means  Possible Resolutions to Barriers: Family education, nursing education, order recommended DME     Medical Summary Current Status: Medically complicated by L  hemiplegia, L shoulder pain, dyusphagia on tube feeds, diarrhea, anemia s/p transfusion, bowel and bladder incontinence, hypertension, and hyperglycemia  Barriers to Discharge: Electrolyte abnormality;Inadequate Nutritional Intake;Incontinence;Medical stability;Self-care education;Symptomatic Anemia;Uncontrolled Pain;Uncontrolled Diabetes  Barriers to Discharge Comments: Anemia requiring blood transfusion, titrating tube feeds, hypertension management, incontinence Possible Resolutions to [employer] Focus: medication titration for BP and inctoninence, nutrition consult for TF management and adjustment to bolus feeds this week   Continued Need for Acute Rehabilitation Level of Care: The patient requires daily medical management by a physician with specialized training in physical medicine and rehabilitation for the following reasons: Direction of a multidisciplinary physical rehabilitation program to maximize functional independence : Yes Medical management of patient stability for increased activity during participation in an intensive rehabilitation regime.: Yes Analysis of laboratory values and/or radiology reports with any subsequent need for medication adjustment and/or medical intervention. : Yes   I attest that I was present, lead the team conference, and concur with the assessment and plan of the team.   Ernest Pine 08/12/2022, 2:58 PM

## 2022-08-13 LAB — GLUCOSE, CAPILLARY
Glucose-Capillary: 128 mg/dL — ABNORMAL HIGH (ref 70–99)
Glucose-Capillary: 139 mg/dL — ABNORMAL HIGH (ref 70–99)
Glucose-Capillary: 144 mg/dL — ABNORMAL HIGH (ref 70–99)
Glucose-Capillary: 155 mg/dL — ABNORMAL HIGH (ref 70–99)
Glucose-Capillary: 156 mg/dL — ABNORMAL HIGH (ref 70–99)
Glucose-Capillary: 160 mg/dL — ABNORMAL HIGH (ref 70–99)
Glucose-Capillary: 191 mg/dL — ABNORMAL HIGH (ref 70–99)
Glucose-Capillary: 201 mg/dL — ABNORMAL HIGH (ref 70–99)

## 2022-08-13 MED ORDER — POLYVINYL ALCOHOL 1.4 % OP SOLN
1.0000 [drp] | OPHTHALMIC | Status: DC | PRN
  Administered 2022-08-14 – 2022-08-18 (×2): 1 [drp] via OPHTHALMIC
  Filled 2022-08-13: qty 15

## 2022-08-13 NOTE — Progress Notes (Signed)
Physical Therapy Session Note  Patient Details  Name: Gina Kelley MRN: [id number] Date of Birth: January 18, 1936  Today's Date: 08/13/2022 PT Individual Time: 1100-1200 PT Individual Time Calculation (min): 60 min   Short Term Goals: Week 2:  PT Short Term Goal 1 (Week 2): Patient will perform bed/chair transfer with LRAD and ModA PT Short Term Goal 2 (Week 2): Patient will maintain dynamic sitting balance with CGA PT Short Term Goal 3 (Week 2): Patient will initiate sit/stand with LRAD  Skilled Therapeutic Interventions/Progress Updates:      Therapy Documentation Precautions:  Precautions Precautions: Fall Precaution Comments: peg tube. HOB no lower than 30*. Left dense hemi. right gaze preference, left side inattention Required Braces or Orthoses: Other Brace Other Brace: PRAFO boots on in bed Restrictions Weight Bearing Restrictions: No    Pt received seated in TIS w/c at bedside, agreeable to PT session. Pt provided rest breaks and repositioning for relief.   Pt transported total A for time management and energy conservation to main gym.   Pt max A with sit to stand as additional assist positioned sling underneath pt's buttocks for standing frame.   Pt tolerated standing in machine operated frame 1 x 10 minutes and 1 x 4 minutes. Pt agreeable to make Valentine's Day card for her daughter. In standing, pt wrote her daughters name and message on holiday card. Pt performed R UE reaching contralaterally to retrieve heart stickers and placed on card with max cues for attention to task.   In standing pt also performed tracking of cones from midline to left visual field. Deferred further activity as pt with incontinent episode.   Pt transported to room and performed multiple rolls with max A. Pt dependent for peri-care and with positive BM.   Pt left semi-reclined in bed with all needs in reach.    Therapy/Group: Individual Therapy  Verl Dicker Verl Dicker PT, DPT   08/13/2022, 7:48 AM

## 2022-08-13 NOTE — Progress Notes (Signed)
Physical Therapy Session Note  Patient Details  Name: Gina Kelley MRN: [id number] Date of Birth: July 28, 1935  Today's Date: 08/13/2022 PT Individual Time: [id number] PT Individual Time Calculation (min): 54 min   Short Term Goals: Week 1:  PT Short Term Goal 1 (Week 1): Pt will roll with max A or less PT Short Term Goal 1 - Progress (Week 1): Met PT Short Term Goal 2 (Week 1): Pt will perform supine to sit with max A PT Short Term Goal 2 - Progress (Week 1): Met PT Short Term Goal 3 (Week 1): Pt will require min A for static sitting balance PT Short Term Goal 3 - Progress (Week 1): Met Week 2:  PT Short Term Goal 1 (Week 2): Patient will perform bed/chair transfer with LRAD and ModA PT Short Term Goal 2 (Week 2): Patient will maintain dynamic sitting balance with CGA PT Short Term Goal 3 (Week 2): Patient will initiate sit/stand with LRAD  Skilled Therapeutic Interventions/Progress Updates:   Pt received supine in bed. RN present in room. PT assessed brief. Noted severe incontinence of bladder soaking through brief and chuck pad. RN made aware. Rolling R and L with total A for PT to assist with pericare. Noted small bowel incontinence as well. Total Assist with +2 for clothing management.   PT performed prolonged stretch to BLE into ankle DF, hip abduction and hip flexion to limit contracture and improve AAROM out of extensor response on the RLE >LLE. Noted to have improved activation in BLE into each movement on second bout with decreased resistance on the RLE for hip flexion and ankle DF. Each  AAROM hold/stretch performed 2 x 2 min.   Pt left supine in bed with call bell in reach with all needs met and in reach.      Therapy Documentation Precautions:  Precautions Precautions: Fall Precaution Comments: peg tube. HOB no lower than 30*. Left dense hemi. right gaze preference, left side inattention Required Braces or Orthoses: Other Brace Other Brace: PRAFO boots on in  bed Restrictions Weight Bearing Restrictions: No   Pain:   Denies. But facial grimace with knee flexion on the RLE.     Therapy/Group: Individual Therapy  Lorie Phenix 08/13/2022, 5:36 PM

## 2022-08-13 NOTE — Progress Notes (Signed)
Speech Language Pathology Daily Session Note  Patient Details  Name: Gina Kelley MRN: [id number] Date of Birth: 06/17/1936  Today's Date: 08/13/2022 SLP Individual Time: [id number] SLP Individual Time Calculation (min): 57 min  Short Term Goals: Week 2: SLP Short Term Goal 1 (Week 2): Pt will communicate basic, immediate wants and needs via multimodal communication with mod assist multimodal cues. SLP Short Term Goal 2 (Week 2): Pt will sustain her attention to functional tasks for ~5 minute intervals with max cues for redirection. SLP Short Term Goal 3 (Week 2): Pt will use overarticulation and increased vocal intensity to achieve intelligibility at the word level with max assist multimodal cues. SLP Short Term Goal 4 (Week 2): Pt will locate objects at midline during functional tasks in 75% of opportunities with max assist multimodal cues. SLP Short Term Goal 5 (Week 2): Pt will consume therapeutic trials of POs with small ice chips with max assist to contain boluses orally and initiate swallow in 75% of opportunities with minimal overt s/s of aspiration.  Skilled Therapeutic Interventions: Skilled treatment session focused on speech, swallowing and cognitive goals. Upon arrival, patient was awake in bed and agreeable to treatment session. SLP provided repositioning in bed to maximize safety for speech and swallowing. SLP provided thorough oral care via the suction toothbrush. Patient with decreased attention today and was more verbose. Patient with language of confusion but was oriented to place and time with Min verbal cues when utilizing a calendar. Patient was ~75% intelligible at the phrase level with Mod verbal cues for use of speech intelligibility strategies. Patient requested to use the bathroom but had already voided before she was placed on the bedpan. Patient consumed trials of small ice chips with Mod verbal cues needed to lip seal/closure of oral cavity and Max verbal cues for  attention to bolus due to verbosity requiring Max verbal and tactile cues to initiate a swallow response. Patient left upright in bed with alarm on and all needs within reach. Continue with current plan of care.      Pain No/Denies Pain   Therapy/Group: Individual Therapy  Boone Gear 08/13/2022, 12:27 PM

## 2022-08-13 NOTE — Progress Notes (Signed)
PROGRESS NOTE   Subjective/Complaints:  No events overnight.  With SLP, verbalizing better this AM, no complaints.    ROS: limited due to language/communication + PEG, L shoulder, bilateral knee pain - improved  Objective:   No results found. Recent Labs    08/11/22 0838  WBC 6.4  HGB 8.5*  HCT 26.3*  PLT 285    Recent Labs    08/11/22 0838  NA 135  K 3.4*  CL 95*  CO2 34*  GLUCOSE 160*  BUN 31*  CREATININE 0.57  CALCIUM 8.2*    No intake or output data in the 24 hours ending 08/13/22 1709         Physical Exam: Vital Signs Blood pressure (!) 148/66, pulse 87, temperature 98 F (36.7 C), resp. rate 16, height 5' 3"$  (1.6 m), weight 94.8 kg, SpO2 98 %.  Constitutional: No distress . Vital signs reviewed. Appears comfortable in bed HEENT: NCAT, lip swelling, particular lower - improved Neck: supple, forward flexed.  Cardiovascular: RRR without murmur. No JVD    Respiratory/Chest: CTAB. No RRW, Normal effort.  GI/Abdomen: Normal active bowel sounds, soft, nontender +PEG  Ext: no clubbing, cyanosis. LUE 1+ Edema - stable Psych: pleasant and cooperative  Skin: C/D/I. No apparent lesions.   c/d/I. +Peg + Sacral wound  - scabbed  MSK:      1/2 fingerbreadth subluxation L shoulder      Strength: unchanged 2/6                LUE: Flaccid                 LLE:  antigravity   Neurologic exam:  Cognition: AAO to person, place, and time. Follows simple commands, reacts to verbal input. Improving dysarthric verbalizations     Insight: Fair insight into current condition. Knows she had a stroke.   + LUE Hoffman's; +L facial droop. + L hemineglect MUCH improved, eyes can now pass midline. + L shoulder weakness--no change Spasticity:  none  Assessment/Plan: 1. Functional deficits which require 3+ hours per day of interdisciplinary therapy in a comprehensive inpatient rehab setting. Physiatrist is  providing close team supervision and 24 hour management of active medical problems listed below. Physiatrist and rehab team continue to assess barriers to discharge/monitor patient progress toward functional and medical goals  Care Tool:  Bathing    Body parts bathed by patient: Face   Body parts bathed by helper: Left lower leg, Right lower leg, Left upper leg, Right upper leg, Buttocks, Front perineal area, Abdomen, Chest, Left arm, Right arm     Bathing assist Assist Level: Total Assistance - Patient < 25%     Upper Body Dressing/Undressing Upper body dressing   What is the patient wearing?: Pull over shirt    Upper body assist Assist Level: Total Assistance - Patient < 25%    Lower Body Dressing/Undressing Lower body dressing      What is the patient wearing?: Pants, Incontinence brief     Lower body assist Assist for lower body dressing: Dependent - Patient 0%     Toileting Toileting    Toileting assist Assist for toileting: Dependent - Patient  0%     Transfers Chair/bed transfer  Transfers assist  Chair/bed transfer activity did not occur: Safety/medical concerns (unable 2/2 pain, fatigue, weakness)  Chair/bed transfer assist level: 2 Helpers     Locomotion Ambulation   Ambulation assist   Ambulation activity did not occur: Safety/medical concerns (unable 2/2 pain, fatigue, weakness)          Walk 10 feet activity   Assist  Walk 10 feet activity did not occur: Safety/medical concerns        Walk 50 feet activity   Assist Walk 50 feet with 2 turns activity did not occur: Safety/medical concerns         Walk 150 feet activity   Assist Walk 150 feet activity did not occur: Safety/medical concerns         Walk 10 feet on uneven surface  activity   Assist Walk 10 feet on uneven surfaces activity did not occur: Safety/medical concerns         Wheelchair     Assist Is the patient using a wheelchair?: Yes Type of  Wheelchair: Manual Wheelchair activity did not occur: Safety/medical concerns (unable 2/2 pain, fatigue, weakness)    Max wheelchair distance: 0    Wheelchair 50 feet with 2 turns activity    Assist    Wheelchair 50 feet with 2 turns activity did not occur: Safety/medical concerns       Wheelchair 150 feet activity     Assist  Wheelchair 150 feet activity did not occur: Safety/medical concerns       Blood pressure (!) 148/66, pulse 87, temperature 98 F (36.7 C), resp. rate 16, height 5' 3"$  (1.6 m), weight 94.8 kg, SpO2 98 %.    Medical Problem List and Plan: 1. Functional deficits secondary to subdural hematoma and ICH             - patient may shower with PEG site covered             - ELOS/Goals: 14-16 days, mod assist PT, mod assist OT, mod assist SLP  - goal DC 2/28, will discuss Dispo plan with family this week              - Does well communicating with writing board and fingers for y/n             - Family requests change from DNR to full code on admission; changed   - Continue CIR therapies including PT, OT, and SLP    - 2/6 ordered L PRAFO, WHO, and soft cervical collar to help with head positioning OOB  -Able to stand x2 for 5 min with PT  2.  Antithrombotics: -DVT/anticoagulation:  Mechanical:  Antiembolism stockings, knee (TED hose) Bilateral lower extremities; add SCDs             -antiplatelet therapy: none   - Duplex LUE pending - 2/8 superficial vein thrombosis involving the left cephalic vein-- warm compresses and conservative treatment   3. Pain Management: Tylenol as needed  - improved   - 2/6: lidocaine patch to L shoulder for pain - benefitting    - 2/9: voltaren gel to L shoulder, b/l knees  4. Mood/Behavior/Sleep: LCSW to evaluate and provide emotional support             -antipsychotic agents: n/a   5. Neuropsych/cognition: This patient is not capable of making decisions on her own behalf.   6. Skin/Wound Care: Routine skin care  checks - coccyx PI  with scab  - routine care - LAL mattress  - Dressings under PEG tube site to keep area dry, as appears moisture trapping under button 2/9 - improved   7. Fluids/Electrolytes/Nutrition: Routine Is and Os and follow-up chemistries             -NPO; has PEG             -continue Glucerna 1.5 continuous Tfs  - 2/9: Add potasium supplementation 20 meq BID; 2/11 dced  - 2/12: Add 40 meq daily for K 3.4 ;Repeat in 2-3 days   - Protein calorie malnutrition - on Juven BID              - Consult dietary to follow for titration - BG mildly elevated but in good range given age, monitor  - 2/14: per dietary, cannot transition to bolus feeds d/t J tube. Looking into reducing hours of TF in daytime for ease of admin in home setting.    Recent Labs    08/13/22 1204 08/13/22 1606 08/13/22 1639  GLUCAP 156* 201* 191*        8: Hypertension: monitor TID and prn             -continue amlodipine 10 mg daily             -losartan 50 mg daily -> Dced 2/3 d/t concern for angioedema               - Add hydralazine 10 mg PRN for SBP >180, DBP>100    08/13/2022    1:27 PM 08/13/2022    5:00 AM 08/13/2022    3:28 AM  Vitals with BMI  Weight  209 lbs   BMI  [id number]   Systolic [id number]  [id number]  Diastolic 66  80  Pulse 87  86   2/4: BP stable/high; mild tachycardia this AM; if AM labs do not show dehydration, would start HCTZ 12.5 mg daily for adjunctive control  2/5 BP with improvement over last 12 hours--will hold off on adding any further medication right now.  2/6 - mild HTN, no med changes, labs in AM  2/7 -   HTN remains mild; start hctz 6.325 mg per tube daily; labs in AM 2/9  2/9: DC HCTZ, switch to Coreg 3.125 BID, DC K supplement  2/12: Monitor, if consistently HTN can increase coreg  2/13: increase to 6.5 mg BID  9: Hyperlipidemia: continue statin, Zetia   10: SDH/ICH s/p crani: Finished Keppra for sz ppx.    11: Leukocytosis: current temperature 99.3, ceftazidime completed 1/29  - resolved             -Follow-up CBC with differential, mild WBC increase 2.2 likely d/t POD#1; downtrending 2/3               - Fever overnight prior to admission; POD from PEG, monitor for recurrence  - No fever recurrence 2/3; monitor  - 2/6: Labs in AM. CXR today for course lung sounds, and duplex for LUE swelling. Strict oral care QID    - 2/7: AM labs with mild subsegmental atelectasis on R; encourage ISB  -2/10 WBC 7.0 on 2/7, no signs of infection  12: Anemia, multifactorial:               -Follow-up CBC -hgb with sl decrease again today, normocytic  -check stool for OB  -PA added b-supp. Add Fe++ too  - 2/9: transfuse 1 unit for HgB 6.4 today. FOBT pending.  2/10 FOBT pos, recheck, recheck H&H tomorrow, consider GI consult  2/11 H/H stable, spoke with GI, no intervention at this time supportive care, GI ordered BMP,  re consult if HGB continues to decrease, appreciate assistance   2/12: HgB stable   13. ?Angioedema vs. Chlorhexadine reaction - improving             - Improving per family after DC chlorhexidine skin wipes and oral mouthwash              - Per SLP notes, interferes with tongue movement, speaking             - Hx cough with Enalipril, Losartan increased from OP 25 mg to 50 mg 1/14, never weaned; edema started 1/30 per chart review             - d/w pharmacy 2/4 and agree that reaction can occur at any time, and may cross-react with ARB. Will attempt to titrate BP meds without ACE/ARBs and monitor swelling.   14. L hemineglect - causing R gaze preference and positional tone             - Repositioning in bed to straighten head--head more neutral today 2/5             - May need to consider botox given need to recover cognitive function and relatively little tone elsewhere  - 2/6: E stim and taping to L shoulder for mild sublux per OT.   15. Urinary incontinence  - Foley removed 2/6  - PVRs QID and ISC for >350 ccs retention  - 2/7: 1x PVR low; monitor; ?  Incontinence vs mixed, will consider timed toiletting after more information gathered  - 2/12: Start timed toiletting Q4H for urgency  16. Dysphagia, s/p PEG placement.   - Remains NPO  - Strict oral care QID   - CXR today given course breath sounds, weak cough, poor secretion clearance - R subsegmental atelectatis, encourage ISB  17. Diarrhea - stable/improving  - loose stools likely d/t tube feeds; on fibercon; will add immodium for >2 diarrhea BM in 24 hours  - type 6 stool today x1 appears to be improving  - LBM 2/12, mushy  LOS: 11 days A FACE TO FACE EVALUATION WAS PERFORMED  Gertie Gowda 08/13/2022, 5:09 PM

## 2022-08-13 NOTE — Progress Notes (Signed)
Physical Therapy Session Note  Patient Details  Name: Gina Kelley MRN: [id number] Date of Birth: Jan 10, 1936  Today's Date: 08/13/2022 PT Individual Time: 0915-1000 PT Individual Time Calculation (min): 45 min   Short Term Goals: Week 2:  PT Short Term Goal 1 (Week 2): Patient will perform bed/chair transfer with LRAD and ModA PT Short Term Goal 2 (Week 2): Patient will maintain dynamic sitting balance with CGA PT Short Term Goal 3 (Week 2): Patient will initiate sit/stand with LRAD  Skilled Therapeutic Interventions/Progress Updates:  Patient greeted supine in bed and agreeable to PT treatment session. Patient's brief noted to be clean upon inspection. Therapist threaded pants with MaxA for time management- Patient then rolled L and R with MaxA in order for therapist to pull pants over hips. Patient transitioned from supine to sitting EOB with Mod/MaxA- Patient was able to place B LE off the bed, however required MinA for fully placing them off the bed. Patient required [place] for initiating righting trunk with VC for placing R elbow and then hand underneath her to fully right her trunk. While sitting EOB, patient was able to maintain sitting balance with SBA and verbal cues for R hand placement for improved stability. Patient performed squat pivot transfer from EOB to TIS wheelchair with MaxA- VC for placing R hand on therapist's hips in order to decrease resistance with R UE. Patient wheeled to rehab gym for time management.   Therapist performed sustained stretching to R upper traps, R levator scapulae and anterior neck flexors secondary to R gaze preference with R head rotation and flexion. Performed for ~8 minutes.   While seated in wheelchair patient performed anterior weight shifts and R lateral weight shifts with B UE on large swiss ball and MinA for improved weight shifting and L UE management. After each roll-out, patient was tasked with sitting upright and focusing on midline  posturing with forward gaze. At times therapist would hold hands with patient's R hand to decreasing pushing with good improvements noted.   Patient repositioned in [place] wheelchair and returned to her room reclined in [place] wheelchair with seatbelt on, call bell in lap, feeding tube re-started and all needs met.    Therapy Documentation Precautions:  Precautions Precautions: Fall Precaution Comments: peg tube. HOB no lower than 30*. Left dense hemi. right gaze preference, left side inattention Required Braces or Orthoses: Other Brace Other Brace: PRAFO boots on in bed Restrictions Weight Bearing Restrictions: No  Therapy/Group: Individual Therapy  Eutimio Gharibian 08/13/2022, 7:59 AM

## 2022-08-13 NOTE — Progress Notes (Signed)
Patient ID: Gina Kelley, female   DOB: July 30, 1935, 87 y.o.   MRN: [id number]   1137- SW left message for [name] to inform on attending being available for family meeting on Friday at 1pm, and to confirm is this date and time works.  *SW received return phone call from [name] reporting that she will speak with siblings, and will follow-up to confirm.  1515-SW returned phone call to [name] who confirmed family meeting on Friday at [place] left message informing to come a little before 1pm and will be taken to room for meeting.   SW went by room to see if family present. [name] and husband in room. SW provided brief write up on updates from team conference, and guide for terminology used. She will disseminate information to her siblings. They will be here on Friday around 1230pm.   Loralee Pacas, MSW, [place] Office: [phone] Cell: [phone] Fax: [phone]

## 2022-08-14 LAB — GLUCOSE, CAPILLARY
Glucose-Capillary: 163 mg/dL — ABNORMAL HIGH (ref 70–99)
Glucose-Capillary: 136 mg/dL — ABNORMAL HIGH (ref 70–99)
Glucose-Capillary: 166 mg/dL — ABNORMAL HIGH (ref 70–99)
Glucose-Capillary: 200 mg/dL — ABNORMAL HIGH (ref 70–99)
Glucose-Capillary: 196 mg/dL — ABNORMAL HIGH (ref 70–99)

## 2022-08-14 MED ORDER — ACETAMINOPHEN 160 MG/5ML PO SOLN
650.0000 mg | Freq: Three times a day (TID) | ORAL | Status: DC
  Administered 2022-08-14 – 2022-08-22 (×23): 650 mg
  Filled 2022-08-14 (×25): qty 20.3

## 2022-08-14 MED ORDER — GLUCERNA 1.5 CAL PO LIQD
237.0000 mL | Freq: Every day | ORAL | Status: DC
  Administered 2022-08-14 – 2022-08-22 (×41): 237 mL
  Filled 2022-08-14 (×43): qty 237

## 2022-08-14 MED ORDER — FREE WATER
120.0000 mL | Freq: Every day | Status: DC
  Administered 2022-08-14 – 2022-08-22 (×41): 120 mL

## 2022-08-14 MED ORDER — GABAPENTIN 250 MG/5ML PO SOLN: 300.0000 mg | Freq: Three times a day (TID) | ORAL | Status: DC | PRN

## 2022-08-14 NOTE — Progress Notes (Signed)
Occupational Therapy Session Note  Patient Details  Name: Gina Kelley MRN: [id number] Date of Birth: 1936/05/09  Today's Date: 08/14/2022 OT Individual Time: [id number] OT Individual Time Calculation (min): 72 min    Short Term Goals: Week 2:  OT Short Term Goal 1 (Week 2): Pt will increase bed mobility while transitioning to seated on EOB from supine with Max A. OT Short Term Goal 2 (Week 2): Pt will increase attention to her immediate environment by locating 2 items needed for grooming task when placed to her left. OT Short Term Goal 3 (Week 2): Patient will complete functional transfer with LRAD and Max A of 1 person  Skilled Therapeutic Interventions/Progress Updates:    Pt greeted semi-reclined in bed, asleep, easy to wake and agreeable to OT treatment session. When rehab tech asked pt how she was doing, pt stated "I'm doing fine." She said this much clearer than usual. Pt's brief clean and dry. Total  A to threat pants, then max/total A +2 for rolling to pull up pants. Max A for bed mobility, then pt able to sit EOB with min A progressing to supervision. Slideboard transfer from bed>TIS wc with max A +2 but some initiation from pt today. Pt brought to the sinik in wc and pt initiated washing her face Pt brought to therapy gym and standing frame turned to encourage head turn to the L to look out the window. L UE NMR in standing frame with wieght bearing towel pushes. Pt able to initiate some activation from scapula and shoulder today! OT attempted e-stim to wrist extensors, but unable to get a muscle contraction. Pt returned to room and left seated in TIS wc with alarm belt on, wc seatbelt on, soft touch call bell in reach and pt preferred gospel music playing.   Therapy Documentation Precautions:  Precautions Precautions: Fall Precaution Comments: peg tube. HOB no lower than 30*. Left dense hemi. right gaze preference, left side inattention Required Braces or Orthoses: Other  Brace Other Brace: PRAFO boots on in bed Restrictions Weight Bearing Restrictions: No  Pain: Denies pain   Therapy/Group: Individual Therapy  Valma Cava 08/14/2022, 12:35 PM

## 2022-08-14 NOTE — Progress Notes (Signed)
Physical Therapy Session Note  Patient Details  Name: Gina Kelley MRN: [id number] Date of Birth: [birth year]  Today's Date: 08/14/2022 PT Individual Time: [id number] PT Individual Time Calculation (min): 76 min   Short Term Goals: Week 2:  PT Short Term Goal 1 (Week 2): Patient will perform bed/chair transfer with LRAD and ModA PT Short Term Goal 2 (Week 2): Patient will maintain dynamic sitting balance with CGA PT Short Term Goal 3 (Week 2): Patient will initiate sit/stand with LRAD  Skilled Therapeutic Interventions/Progress Updates:    Pt received sitting in TIS w/c awake and agreeable to therapy session. Nurse present to disconnect tube feedings for therapy session.  Transported to/from gym in w/c for time management and energy conservation.  R squat/lateral scoot transfer TIS w/c>EOM with heavy mod/max assist for lifting/pivoting hips - attempted to allow pt increased initiation of transfer resulting in much smaller scoots requiring many to complete the transfer - pt is able to initiate pushing through B LEs to lift hips with adequate anterior trunk lean - no significant pushing noted this time.   Pt found to be soiled through clothing therefore performed L lateral scoot/squat pivot back to TIS w/c with continued max assist for lifting/pivoting hips while providing multimodal cuing for improved sequencing of head/hips relationship and pushing up through B LEs - pt continues to have significant L inattention with decreased motor planning in this direction.   Transported back to her room. R squat pivot (larger gap between TIS w/c and bed) requiring heavy max assist for lifting/pivoting hips. Sit>supine via reverse logroll to increase pt independence with heavy mod assist for trunk descent and B LE management onto bed. Rolling R/L in bed with max/total assist and pt demoing poor motor planning/sequencing of this task with stiff trunk posturing, poor initiation of using LEs to assist with  pivoting hips, holds LEs stiffly as well (of note: pt does have limited R knee flexion PROM). Dependent LB clothing management and peri-care.   Pt's w/c cushion cover being washed.  Supine>sitting R EOB via logroll technique to increase pt independence with total assist for supine>sidelying then heavy max assist for sidelying>sitting to manage B LEs and bring trunk upright - manual facilitation/cuing for use of R UE to assist with trunk.  L squat pivot EOB>TIS w/c heavy max assist for lifting/pivoting hips.   Transported back to gym. Squat pivots w/c<>EOM max assist as described above.   Sit<>stand x2 reps EOM<>R HHA from +2 assist and L UE around therapist's shoulders - +2 mod assist for lifting to stand and balance due to posterior lean bias - mirror feedback to promote increased upright posture - +2 blocking pt from pushing/sliding R foot forward on floor - therapist blocking L knee for safety initially but then only guarding - pt demos excessive hip flexed posture with likely decreased flexibility to move into full hip extension to achieve upright posture - therapist manually facilitation trunk/hip extension but only with minimal improvement on 2nd set.  Pt would benefit from shoes to protect her feet during transfers and provide improved traction/support.  At end of session, pt left seated in TIS w/c with needs in reach, TIS w/c seat belt in place, seat belt alarm on, L UE supported on pillows, and nurse aware of pt's position.   Therapy Documentation Precautions:  Precautions Precautions: Fall Precaution Comments: peg tube. HOB no lower than 30*. Left dense hemi. right gaze preference, left side inattention Required Braces or Orthoses: Other Brace Other Brace: PRAFO  boots on in bed Restrictions Weight Bearing Restrictions: No   Pain:  Reports some L shoulder pain, but does not impact participation - therapist ensuring UE supported/positioned at end of session.   Therapy/Group:  Individual Therapy  Tawana Scale , PT, DPT, NCS, CSRS 08/14/2022, 12:59 PM

## 2022-08-14 NOTE — Progress Notes (Signed)
Nutrition Follow-up  DOCUMENTATION CODES:   Non-severe (moderate) malnutrition in context of chronic illness  INTERVENTION:  - Modify EN regiment to bolus feeds of Glucerna 1.5(5 cans per day).   - This will provide 1777 kcals, 97 gm protein and 900 mL fluid.  - FWF 120 5x daily (60 mL free water before and after each can). - Provides 1500 mL total free water.   -Continue Juven 1 packet per tube BID. Each packet provides 95 kcal, 7 grams L-Arginine, 7 grams L-Glutamine, 2.5 grams collagen protein, 300 mg vitamin C, 9.5 mg zinc, and other micronutrients essential for wound healing.   NUTRITION DIAGNOSIS:   Moderate Malnutrition related to chronic illness as evidenced by moderate fat depletion, moderate muscle depletion, severe muscle depletion. - Ongoing   GOAL:   Patient will meet greater than or equal to 90% of their needs - Met with TF.   MONITOR:   Labs, Weight trends, TF tolerance, Skin, I & O's  REASON FOR ASSESSMENT:   Consult Enteral/tube feeding initiation and management, Assessment of nutrition requirement/status, Wound healing  ASSESSMENT:   87 year old female with PMHx of DM, HTN, HLD, CKD admitted with ICH and SDH s/p craniotomy.  Meds reviewed:  Vit C, lipitor, pepcid, ferrous sulfate, sliding scale insulin, zinc sulfate. Labs reviewed: K low.   Pt with PEG tube. Per documentation, it is noted that the pt has PEG-J tube, however RD assessed tube in-person and confirmed that the tube is a PEG tube. RD will modify EN regimen to bolus feeds. Will continue to monitor EN tolerance.   Diet Order:   Diet Order             Diet NPO time specified  Diet effective now                   EDUCATION NEEDS:   Not appropriate for education at this time  Skin:  Skin Assessment: Skin Integrity Issues: Skin Integrity Issues:: Stage II, Incisions Stage II: sacrum Incisions: closed incision to head  Last BM:  2/14  Height:   Ht Readings from Last 1  Encounters:  08/02/22 5' 3"$  (1.6 m)    Weight:   Wt Readings from Last 1 Encounters:  08/14/22 94.3 kg    Ideal Body Weight:  52.3 kg  BMI:  Body mass index is 36.85 kg/m.  Estimated Nutritional Needs:   Kcal:  1500-1700  Protein:  75-90 grams  Fluid:  > 1.5 L/day  Thalia Bloodgood, RD, LDN, CNSC.

## 2022-08-14 NOTE — Progress Notes (Signed)
Speech Language Pathology Daily Session Note  Patient Details  Name: Gina Kelley MRN: [id number] Date of Birth: December 27, 1935  Today's Date: 08/14/2022 SLP Individual Time: [id number] SLP Individual Time Calculation (min): 55 min  Short Term Goals: Week 2: SLP Short Term Goal 1 (Week 2): Pt will communicate basic, immediate wants and needs via multimodal communication with mod assist multimodal cues. SLP Short Term Goal 2 (Week 2): Pt will sustain her attention to functional tasks for ~5 minute intervals with max cues for redirection. SLP Short Term Goal 3 (Week 2): Pt will use overarticulation and increased vocal intensity to achieve intelligibility at the word level with max assist multimodal cues. SLP Short Term Goal 4 (Week 2): Pt will locate objects at midline during functional tasks in 75% of opportunities with max assist multimodal cues. SLP Short Term Goal 5 (Week 2): Pt will consume therapeutic trials of POs with small ice chips with max assist to contain boluses orally and initiate swallow in 75% of opportunities with minimal overt s/s of aspiration.  Skilled Therapeutic Interventions: Pt seen this date for skilled ST intervention targeting swallowing and communication goals outlined above Pt received lying semi-reclined in bed with eyes closed; opened eyes to name. TF's running. Brief and linens saturated. Received assistance from RN for changing brief, peri-care, and repositioning in bed to maximize safety for therapeutic PO trials. Agreeable to intervention via head nod. Posture/positioning for PO intake remains limited by R lean, head tilt, and poor overall awareness of body positioning and cognitive deficits (unable to self-correct). RN and MD report change of TF's from continuous to bolus.  Today's session with emphasis on therapeutic PO trials of wet oral swabs, very small ice chips, and 1/8 tsp of thin water and therapeutic speech tasks targeting intelligibility.   Prior to  initiation of PO trials, SLP provided Total A for oral care via suction toothbrush for thoroughness. Given Max A multimodal cues to include tactile and verbal cues for hyoglossal assistance, labial support for closure, and to assist in swallow initiation (seemingly delayed swallow initiation with lingual pumping and lip incompetence noted), pt accepted very small trials of aforementioned consistencies. Cough on ~10% of trials with SLP providing additional cueing to continue production of volitional throat clear and cough to aid in returning vocal quality to baseline - remained wet following pt's reflexive cough attempt. Verbal cues for swallow initiation appeared minimally beneficial.   Continue to recommend NPO And oral care QID. May consider use of siphoned straw in upcoming sessions or repeat instrumental swallow study to assess readiness for PO trials, even for therapeutic purposes.  Re: speech intelligibility, pt remains limited by severe oral motor impairments. Addressed bilabial CV and CVC word structures and single, progressing to two-syllable automatic speech tasks to focus on mass practice of over-articulation and improved breath support - intelligibility ~65-70%. Benefited from Max A demonstration and verbal cues to maintain use of strategies within these structured contexts. Outside of known context, pt remains essentially unintelligible and continue to recommend use of AAC methods to facilitate communication.   Pt left in bed with all safety measures activated, call bell within reach, and all immediate needs met. Continue per current ST POC.  Pain Pt denies pain; NAD  Therapy/Group: Individual Therapy  Townes Fuhs A Bronco Mcgrory 08/14/2022, 10:47 AM

## 2022-08-14 NOTE — Progress Notes (Addendum)
PROGRESS NOTE   Subjective/Complaints:  No events overnight. Working with SLP on vocalizations today. Remains incontinent bowel and bladder. C/o pain in L hand, shoulder today.   ROS: limited due to language/communication + L shoulder pain - ongoing; PEG and knee pain improved  Objective:   No results found. No results for input(s): "WBC", "HGB", "HCT", "PLT" in the last 72 hours.  No results for input(s): "NA", "K", "CL", "CO2", "GLUCOSE", "BUN", "CREATININE", "CALCIUM" in the last 72 hours.  No intake or output data in the 24 hours ending 08/14/22 1403         Physical Exam: Vital Signs Blood pressure 136/74, pulse 83, temperature 98.7 F (37.1 C), temperature source Oral, resp. rate 14, height 5' 3"$  (1.6 m), weight 94.3 kg, SpO2 97 %.  Constitutional: No distress . Vital signs reviewed. Appears comfortable in bed HEENT: NCAT, lip swelling, particular lower - improved Neck: supple, forward flexed.  Cardiovascular: RRR without murmur. No JVD    Respiratory/Chest: CTAB. No RRW, Normal effort.  GI/Abdomen: Normal active bowel sounds, soft, nontender +PEG  Ext: no clubbing, cyanosis. LUE 1+ Edema - stable Psych: pleasant and cooperative  Skin: C/D/I. No apparent lesions.   c/d/I. +Peg + Sacral wound  - scabbed  MSK:      1/2 fingerbreadth subluxation L shoulder      Strength: unchanged 2/6                LUE: Flaccid                 LLE:  antigravity   Neurologic exam:  Cognition: AAO to person, place, and time. Follows simple commands, reacts to verbal input. Improving dysarthric verbalizations     Insight: Fair insight into current condition. Knows she had a stroke.   + LUE Hoffman's; +L facial droop. + L hemineglect MUCH improved, eyes can now pass midline. + L shoulder weakness--no change Spasticity:  none  Assessment/Plan: 1. Functional deficits which require 3+ hours per day of interdisciplinary therapy  in a comprehensive inpatient rehab setting. Physiatrist is providing close team supervision and 24 hour management of active medical problems listed below. Physiatrist and rehab team continue to assess barriers to discharge/monitor patient progress toward functional and medical goals  Care Tool:  Bathing    Body parts bathed by patient: Face   Body parts bathed by helper: Left lower leg, Right lower leg, Left upper leg, Right upper leg, Buttocks, Front perineal area, Abdomen, Chest, Left arm, Right arm     Bathing assist Assist Level: Total Assistance - Patient < 25%     Upper Body Dressing/Undressing Upper body dressing   What is the patient wearing?: Pull over shirt    Upper body assist Assist Level: Total Assistance - Patient < 25%    Lower Body Dressing/Undressing Lower body dressing      What is the patient wearing?: Pants, Incontinence brief     Lower body assist Assist for lower body dressing: Dependent - Patient 0%     Toileting Toileting    Toileting assist Assist for toileting: Dependent - Patient 0%     Transfers Chair/bed transfer  Transfers assist  Chair/bed transfer activity did not occur: Safety/medical concerns (unable 2/2 pain, fatigue, weakness)  Chair/bed transfer assist level: 2 Helpers     Locomotion Ambulation   Ambulation assist   Ambulation activity did not occur: Safety/medical concerns (unable 2/2 pain, fatigue, weakness)          Walk 10 feet activity   Assist  Walk 10 feet activity did not occur: Safety/medical concerns        Walk 50 feet activity   Assist Walk 50 feet with 2 turns activity did not occur: Safety/medical concerns         Walk 150 feet activity   Assist Walk 150 feet activity did not occur: Safety/medical concerns         Walk 10 feet on uneven surface  activity   Assist Walk 10 feet on uneven surfaces activity did not occur: Safety/medical concerns          Wheelchair     Assist Is the patient using a wheelchair?: Yes Type of Wheelchair: Manual Wheelchair activity did not occur: Safety/medical concerns (unable 2/2 pain, fatigue, weakness)    Max wheelchair distance: 0    Wheelchair 50 feet with 2 turns activity    Assist    Wheelchair 50 feet with 2 turns activity did not occur: Safety/medical concerns       Wheelchair 150 feet activity     Assist  Wheelchair 150 feet activity did not occur: Safety/medical concerns       Blood pressure 136/74, pulse 83, temperature 98.7 F (37.1 C), temperature source Oral, resp. rate 14, height 5' 3"$  (1.6 m), weight 94.3 kg, SpO2 97 %.    Medical Problem List and Plan: 1. Functional deficits secondary to subdural hematoma and ICH             - patient may shower with PEG site covered             - ELOS/Goals: 14-16 days, mod assist PT, mod assist OT, mod assist SLP  - goal DC 2/28, will discuss Dispo plan with family this week              - Does well communicating with writing board and fingers for y/n             - Family requests change from DNR to full code on admission; changed   - Continue CIR therapies including PT, OT, and SLP    - 2/6 ordered L PRAFO, WHO, and soft cervical collar to help with head positioning OOB  -Able to stand x2 for 5 min with PT  2.  Antithrombotics: -DVT/anticoagulation:  Mechanical:  Antiembolism stockings, knee (TED hose) Bilateral lower extremities; add SCDs             -antiplatelet therapy: none   - Duplex LUE pending - 2/8 superficial vein thrombosis involving the left cephalic vein-- warm compresses and conservative treatment   3. Pain Management: Tylenol as needed  - improved   - 2/6: lidocaine patch to L shoulder for pain - benefitting    - 2/9: voltaren gel to L shoulder, b/l knees  - 2/15: schedule Tylenol 650 mg Q8H, gabapentin 300 mg TID PRn  4. Mood/Behavior/Sleep: LCSW to evaluate and provide emotional support              -antipsychotic agents: n/a   5. Neuropsych/cognition: This patient is not capable of making decisions on her own behalf.   6. Skin/Wound Care: Routine skin care checks -  coccyx PI with scab  - routine care - LAL mattress  - Dressings under PEG tube site to keep area dry, as appears moisture trapping under button 2/9 - improved   7. Fluids/Electrolytes/Nutrition: Routine Is and Os and follow-up chemistries             -NPO; has PEG             -continue Glucerna 1.5 continuous Tfs  - 2/9: Add potasium supplementation 20 meq BID; 2/11 dced  - 2/12: Add 40 meq daily for K 3.4 ;Repeat in 2-3 days  - Protein calorie malnutrition - on Juven BID             - Consult dietary to follow for titration - BG mildly elevated but in good range given age, monitor  - 2/14: per dietary, cannot transition to bolus feeds d/t J tube. Looking into reducing hours of TF in daytime for ease of admin in home setting.   - 2/15: confirmed G tube with no J component; switching to bolus feeds. Will need to switch BG and insulin to [facility] and HS.    Recent Labs    08/14/22 0350 08/14/22 0756 08/14/22 1209  GLUCAP 163* 136* 166*        8: Hypertension: monitor TID and prn             -continue amlodipine 10 mg daily             -losartan 50 mg daily -> Dced 2/3 d/t concern for angioedema               - Add hydralazine 10 mg PRN for SBP >180, DBP>100    08/14/2022    5:00 AM 08/14/2022    3:51 AM 08/13/2022    7:32 PM  Vitals with BMI  Weight 208 lbs    BMI [id number]    Systolic  [id number]  Diastolic  74 74  Pulse  83 85   2/4: BP stable/high; mild tachycardia this AM; if AM labs do not show dehydration, would start HCTZ 12.5 mg daily for adjunctive control  2/5 BP with improvement over last 12 hours--will hold off on adding any further medication right now.  2/6 - mild HTN, no med changes, labs in AM  2/7 -   HTN remains mild; start hctz 6.325 mg per tube daily; labs in AM 2/9  2/9: DC HCTZ, switch to Coreg  3.125 BID, DC K supplement  2/12: Monitor, if consistently HTN can increase coreg  2/13: increase to 6.5 mg BID - 2/15 well controlled  9: Hyperlipidemia: continue statin, Zetia   10: SDH/ICH s/p crani: Finished Keppra for sz ppx.    11: Leukocytosis: current temperature 99.3, ceftazidime completed 1/29 - resolved             -Follow-up CBC with differential, mild WBC increase 2.2 likely d/t POD#1; downtrending 2/3               - Fever overnight prior to admission; POD from PEG, monitor for recurrence  - No fever recurrence 2/3; monitor  - 2/6: Labs in AM. CXR today for course lung sounds, and duplex for LUE swelling. Strict oral care QID    - 2/7: AM labs with mild subsegmental atelectasis on R; encourage ISB  -2/10 WBC 7.0 on 2/7, no signs of infection  12: Anemia, multifactorial:               -Follow-up CBC -hgb  with sl decrease again today, normocytic  -check stool for OB  -PA added b-supp. Add Fe++ too  - 2/9: transfuse 1 unit for HgB 6.4 today. FOBT pending.   2/10 FOBT pos, recheck, recheck H&H tomorrow, consider GI consult  2/11 H/H stable, spoke with GI, no intervention at this time supportive care, GI ordered BMP,  re consult if HGB continues to decrease, appreciate assistance   2/12: HgB stable   13. ?Angioedema vs. Chlorhexadine reaction - improving             - Improving per family after DC chlorhexidine skin wipes and oral mouthwash              - Per SLP notes, interferes with tongue movement, speaking             - Hx cough with Enalipril, Losartan increased from OP 25 mg to 50 mg 1/14, never weaned; edema started 1/30 per chart review             - d/w pharmacy 2/4 and agree that reaction can occur at any time, and may cross-react with ARB. Will attempt to titrate BP meds without ACE/ARBs and monitor swelling.   14. L hemineglect - causing R gaze preference and positional tone             - Repositioning in bed to straighten head--head more neutral today 2/5              - May need to consider botox given need to recover cognitive function and relatively little tone elsewhere  - 2/6: E stim and taping to L shoulder for mild sublux per OT.   15. Urinary incontinence  - Foley removed 2/6  - PVRs QID and ISC for >350 ccs retention  - 2/7: 1x PVR low; monitor; ? Incontinence vs mixed, will consider timed toiletting after more information gathered  - 2/12: Start timed toiletting Q4H for urgency  16. Dysphagia, s/p PEG placement.   - Remains NPO  - Strict oral care QID   - CXR today given course breath sounds, weak cough, poor secretion clearance - R subsegmental atelectatis, encourage ISB  17. Diarrhea - stable/improving  - loose stools likely d/t tube feeds; on fibercon; will add immodium for >2 diarrhea BM in 24 hours   - 2/15: daily Bms now, improved LOS: 12 days A FACE TO FACE EVALUATION WAS PERFORMED  Gertie Gowda 08/14/2022, 2:03 PM

## 2022-08-15 LAB — GLUCOSE, CAPILLARY
Glucose-Capillary: 180 mg/dL — ABNORMAL HIGH (ref 70–99)
Glucose-Capillary: 114 mg/dL — ABNORMAL HIGH (ref 70–99)
Glucose-Capillary: 186 mg/dL — ABNORMAL HIGH (ref 70–99)
Glucose-Capillary: 183 mg/dL — ABNORMAL HIGH (ref 70–99)
Glucose-Capillary: 114 mg/dL — ABNORMAL HIGH (ref 70–99)
Glucose-Capillary: 178 mg/dL — ABNORMAL HIGH (ref 70–99)

## 2022-08-15 MED ORDER — GABAPENTIN 250 MG/5ML PO SOLN
100.0000 mg | Freq: Three times a day (TID) | ORAL | Status: DC
  Administered 2022-08-15 – 2022-08-22 (×21): 100 mg
  Filled 2022-08-15 (×24): qty 2

## 2022-08-15 NOTE — Progress Notes (Signed)
Speech Language Pathology Daily Session Note  Patient Details  Name: Gina Kelley MRN: [id number] Date of Birth: 04-07-1936  Today's Date: 08/15/2022 SLP Individual Time: [id number] SLP Individual Time Calculation (min): 55 min  Short Term Goals: Week 2: SLP Short Term Goal 1 (Week 2): Pt will communicate basic, immediate wants and needs via multimodal communication with mod assist multimodal cues. SLP Short Term Goal 2 (Week 2): Pt will sustain her attention to functional tasks for ~5 minute intervals with max cues for redirection. SLP Short Term Goal 3 (Week 2): Pt will use overarticulation and increased vocal intensity to achieve intelligibility at the word level with max assist multimodal cues. SLP Short Term Goal 4 (Week 2): Pt will locate objects at midline during functional tasks in 75% of opportunities with max assist multimodal cues. SLP Short Term Goal 5 (Week 2): Pt will consume therapeutic trials of POs with small ice chips with max assist to contain boluses orally and initiate swallow in 75% of opportunities with minimal overt s/s of aspiration.  Skilled Therapeutic Interventions: Pt seen this date for skilled ST intervention targeting swallowing and expressive communication goals outlined above. Pt received awake/alert and OOB in [place], visiting with family.. Agreeable to intervention in hospital room. Pt pleasant and participatory throughout. Family left once SLP entered.   Today's session with emphasis on therapeutic PO trials of wet oral swab + 1/4 tsp sips of thin water via spoon, verbal communication/speech intelligibility, and AAC training. More specifically, SLP facilitated today's session by providing opportunities for pt to verbally produce various speech sounds (bilabial, alveolar, and velar consonants) in tandem with vowels (CV combinations) (a, e, i, o, u - e.g. "ma," "me," "my," "moe," "moo") to allow for mass practice. Provided Max A multimodal cues, positive  reinforcement, and tactile cueing for production of alveolar and velar sounds; Mod A verbal and model prompts for bilabials. With aforementioned techniques, pt ~70% intelligible at the word level; remains essentially unintelligible at the word to phrase level outside of known context and ~40% within known context.   Re: swallowing, pt completed oral care via suction toothbrush with Mod A for thoroughness. Following oral care, SLP provided wet oral swabs and 1/4 tsp sips of thin water via spoon to assess readiness to participate in instrumental swallow assessment. Reflexive cough noted x 2 with 1/4 tsp sips of thin water without use of hyoglossal assistance; with verbal cues and hyoglossal assistance, pt did not exhibit any s/sx concerning for airway intrusion, though oral phase was noted to be prolonged with seemingly delayed A-P transit and swallow initiation. Nevertheless, vocal quality was noted to be clear. At this time, continue to recommend NPO with frequent oral care. May consider MBSS early next week to determine readiness for more aggressive therapeutic trials, and/or readiness for oral diet.  Re: AAC, SLP attempted use of alphabet and picture board, during instances of communication breakdown, with no stimulability observed for either communication method, and required Total A to utilize Pt limited by L inattention, decreased problem-solving, and motor planning deficits. Continues to communicate best when asked yes/no questions or provided with two verbal choice prompts when given extended processing time and repetition of verbal cues due to decreased attention and working memory.   Pt left in room and OOB in TIS with all safety measures activated, call bell reviewed and within reach, and all immediate needs met. Continue per current ST POC.  Pain Denies pain; NAD  Therapy/Group: Individual Therapy  Emin Foree A Teren Franckowiak 08/15/2022, 3:00  PM

## 2022-08-15 NOTE — Progress Notes (Signed)
Patient ID: Gina Kelley, female   DOB: [birth year], 87 y.o.   MRN: [id number]   Patient/Family Conference  Patient/family in attendance:  Staff in attendance:  Main focus:  Synopsis of information shared:  Barriers/concerns expressed by patient and family:  Patient/family response:  Follow-up/action plans:

## 2022-08-15 NOTE — Progress Notes (Signed)
Physical Therapy Session Note  Patient Details  Name: Gina Kelley MRN: [id number] Date of Birth: 24-Nov-1935  Today's Date: 08/15/2022 PT Individual Time: [id number] + [id number] PT Individual Time Calculation (min): 44 min  + 43 min  Short Term Goals: Week 2:  PT Short Term Goal 1 (Week 2): Patient will perform bed/chair transfer with LRAD and ModA PT Short Term Goal 2 (Week 2): Patient will maintain dynamic sitting balance with CGA PT Short Term Goal 3 (Week 2): Patient will initiate sit/stand with LRAD  Skilled Therapeutic Interventions/Progress Updates:      1st session: Pt reclined in TIS w/c to start - agreeable to PT tx. No signs of pain during treatment session.  Transported to main rehab gym. Used SB to transfer to from W/c to mat table, towards her R side, needing totalA (pt<25%). Placed 6inch platform under her LE's to assist with weight bearing and providing BOS during transfer. At edge of mat table, she tolerated sitting unsupported for >15 minutes with varying level of assist - CGA to minA consistently for static sitting balance. Again, used 6inch platform under LE for sitting balance. Also used mirror for visual feedback although limited response to visual cues. Dynamic sitting balance training to facilitate reaching outside BOS with hanging resistive clothespins to mirror and tossing horse shoe to target. Patient with some visio spatial deficits and significant L inattention that limit her. Patient verbalizing during session but unable to understand due to dysarthria and facial droop. Assisted back to her SB transfer with totalA, similar as above. Returned to her room and concluded session reclined in TIS w/c, safety belt alarm on, pillow supporting flaccid LUE, soft call bell in lap.     2nd session: Family at the bedside for scheduled family meeting with the rehab team - discussed purpose of meeting as they were unsure of what to expect. Retrieved disposable paper pants  from closet - family reports that she was just cleaned and had a brief change. Pants donned with total/dependent assist - encouraged her to assist but limited initiation. She also needed totalA for rolling both directions, using chuck pad to assist.   Supine there-ex completed with AAROM needed on LLE and AROM on RLE. Poor sustained attention, internally distracted during there-ex. -1x10 SLR  -1x10 hip abd -1x10 heel slides  Supine<>sitting EOB needing totalA (pt<25%) and then required assist for static sitting EOB due to persistent posterior bias. Large LOB frequently with delayed to absent righting/protective responses. Unable to return to upright sitting unless provided assist. MaxA squat<>pivot transfer from EOB To w/c, towards her weaker L side - assist for repositioning.   Transported to day room rehab gym. Setup at [facility] at w/c level. setupA needed for her feet and she completed x6 minutes at least resistance, around 90cm/sec. Assist needed for AAROM bilaterally due to poor sustained attention and L neglect.   Returned to her room - concluded session reclined in TIS w/c, pillow supporting flaccid LUE, seat belt and safety belt alarm on, soft call bell in lap. Pt made comfortable.   Therapy Documentation Precautions:  Precautions Precautions: Fall Precaution Comments: peg tube. HOB no lower than 30*. Left dense hemi. right gaze preference, left side inattention Required Braces or Orthoses: Other Brace Other Brace: PRAFO boots on in bed Restrictions Weight Bearing Restrictions: No General:    Therapy/Group: Individual Therapy  Maxine Fredman P Smt Lokey PT 08/15/2022, 7:44 AM

## 2022-08-15 NOTE — Plan of Care (Signed)
  Problem: Consults Goal: RH BRAIN INJURY PATIENT EDUCATION Description: Description: See Patient Education module for eduction specifics Outcome: Progressing Goal: Skin Care Protocol Initiated - if Braden Score 18 or less Description: If consults are not indicated, leave blank or document N/A Outcome: Progressing Goal: Nutrition Consult-if indicated Outcome: Progressing   Problem: RH BOWEL ELIMINATION Goal: RH STG MANAGE BOWEL WITH ASSISTANCE Description: STG Manage Bowel with mod Assistance. Outcome: Progressing Goal: RH STG MANAGE BOWEL W/MEDICATION W/ASSISTANCE Description: STG Manage Bowel with Medication with mod Assistance. Outcome: Progressing   Problem: RH BLADDER ELIMINATION Goal: RH STG MANAGE BLADDER WITH ASSISTANCE Description: STG Manage Bladder With mod Assistance Outcome: Progressing Goal: RH STG MANAGE BLADDER WITH EQUIPMENT WITH ASSISTANCE Description: STG Manage Bladder With Equipment With min Assistance Outcome: Progressing   Problem: RH SKIN INTEGRITY Goal: RH STG SKIN FREE OF INFECTION/BREAKDOWN Description: Pressure injuries will improve while in rehab with min assist Outcome: Progressing Goal: RH STG MAINTAIN SKIN INTEGRITY WITH ASSISTANCE Description: STG Maintain Skin Integrity With min Assistance. Outcome: Progressing Goal: RH STG ABLE TO PERFORM INCISION/WOUND CARE W/ASSISTANCE Description: STG Able To Perform Incision/Wound Care With min Assistance. Outcome: Progressing   Problem: RH SAFETY Goal: RH STG ADHERE TO SAFETY PRECAUTIONS W/ASSISTANCE/DEVICE Description: STG Adhere to Safety Precautions With cueing Assistance/Device. Outcome: Progressing   Problem: RH COGNITION-NURSING Goal: RH STG USES MEMORY AIDS/STRATEGIES W/ASSIST TO PROBLEM SOLVE Description: STG Uses Memory Aids/Strategies With cueing Assistance to Problem Solve. Outcome: Progressing   Problem: RH PAIN MANAGEMENT Goal: RH STG PAIN MANAGED AT OR BELOW PT'S PAIN  GOAL Description: Pain will be managed at 4 out of 10 on pain scale with PRN medications min assist Outcome: Progressing   Problem: RH KNOWLEDGE DEFICIT BRAIN INJURY Goal: RH STG INCREASE KNOWLEDGE OF SELF CARE AFTER BRAIN INJURY Description: Patient/caregiver will be able manage mediations, wounds, and self care from nursing education and nursing handouts independently Outcome: Progressing Goal: RH STG INCREASE KNOWLEDGE OF DYSPHAGIA/FLUID INTAKE Description: Patient/caregiver will be able to manage appropriate diet from nursing education and nursing handouts independently  Outcome: Progressing

## 2022-08-15 NOTE — Progress Notes (Signed)
Occupational Therapy Session Note  Patient Details  Name: AIYANA MALPHRUS MRN: [id number] Date of Birth: [birth year]  Today's Date: 08/15/2022 OT Individual Time: [id number] OT Individual Time Calculation (min): 60 min    Short Term Goals: Week 2:  OT Short Term Goal 1 (Week 2): Pt will increase bed mobility while transitioning to seated on EOB from supine with Max A. OT Short Term Goal 2 (Week 2): Pt will increase attention to her immediate environment by locating 2 items needed for grooming task when placed to her left. OT Short Term Goal 3 (Week 2): Patient will complete functional transfer with LRAD and Max A of 1 person  Skilled Therapeutic Interventions/Progress Updates:    Pt greeted semi-reclined in bed asleep. Pt incontinent or urine. Bed level rolling with Max/total A of 1 person for total A peri-care and brief change. Pt initiated bed mobility with cues, but still required max A to sit up. Pt with a little more difficulty achieving sitting balance today requiring max A fading to min A. Slideboard transfer from bed to TIS wc with max A +2. UB bathing/dressing tasks completed at the sink with cues to look in the mirror at midline. Worked on head turns to the L to look past midline and locate 2 items on the sink with moderate cues. OT assisted with re-braiding hair but tried to get pt to verbalize the style she wanted. Gentle stretching and ROM to L UE and skin check under tubigrip. No signs of reddness or skin irritation. Pt left seated in TIS wc with L UE supported, seat belt on, and nursing present to administer meds.   Therapy Documentation Precautions:  Precautions Precautions: Fall Precaution Comments: peg tube. HOB no lower than 30*. Left dense hemi. right gaze preference, left side inattention Required Braces or Orthoses: Other Brace Other Brace: PRAFO boots on in bed Restrictions Weight Bearing Restrictions: No Denies pain   Therapy/Group: Individual Therapy  Valma Cava 08/15/2022, 9:03 AM

## 2022-08-15 NOTE — Progress Notes (Signed)
PROGRESS NOTE   Subjective/Complaints:  No events overnight. Continues to complain of pain in R shoulder. No other concerns.   ROS: limited due to language/communication + L shoulder pain - ongoing; PEG and knee pain improved  Objective:   No results found. No results for input(s): "WBC", "HGB", "HCT", "PLT" in the last 72 hours.  No results for input(s): "NA", "K", "CL", "CO2", "GLUCOSE", "BUN", "CREATININE", "CALCIUM" in the last 72 hours.  No intake or output data in the 24 hours ending 08/15/22 2025         Physical Exam: Vital Signs Blood pressure (!) 152/76, pulse 86, temperature 98.4 F (36.9 C), resp. rate 15, height 5' 3"$  (1.6 m), weight 94.3 kg, SpO2 96 %.  Constitutional: No distress . Vital signs reviewed. Appears comfortable in bed HEENT: NCAT, lip swelling, particular lower - R>L, appears dependent, also with eyelid swelling Neck: supple, forward flexed.  Cardiovascular: RRR without murmur. No JVD    Respiratory/Chest: CTAB. No RRW, Normal effort.  GI/Abdomen: Normal active bowel sounds, soft, nontender +PEG, c/d/i Ext: no clubbing, cyanosis. LUE 1+ Edema - stable Psych: pleasant and cooperative  Skin: C/D/I. No apparent lesions.   + Sacral wound  - scabbed - improved  MSK:      1/2 fingerbreadth subluxation L shoulder - stable      Strength:                 LUE: Flaccid , 0/5 throughout                LLE:  antigravity HF, KE   Neurologic exam:  Cognition: AAO to person, place, and time. Follows simple commands, reacts to verbal input. Improving dysarthric verbalizations     Insight: Fair insight into current condition. Knows she had a stroke.   + LUE Hoffman's; +L facial droop. + L hemineglect, eyes can now pass midline. + L shoulder weakness--no change Spasticity:  none  Assessment/Plan: 1. Functional deficits which require 3+ hours per day of interdisciplinary therapy in a comprehensive  inpatient rehab setting. Physiatrist is providing close team supervision and 24 hour management of active medical problems listed below. Physiatrist and rehab team continue to assess barriers to discharge/monitor patient progress toward functional and medical goals  Care Tool:  Bathing    Body parts bathed by patient: Face   Body parts bathed by helper: Left lower leg, Right lower leg, Left upper leg, Right upper leg, Buttocks, Front perineal area, Abdomen, Chest, Left arm, Right arm     Bathing assist Assist Level: Total Assistance - Patient < 25%     Upper Body Dressing/Undressing Upper body dressing   What is the patient wearing?: Pull over shirt    Upper body assist Assist Level: Total Assistance - Patient < 25%    Lower Body Dressing/Undressing Lower body dressing      What is the patient wearing?: Pants, Incontinence brief     Lower body assist Assist for lower body dressing: Dependent - Patient 0%     Toileting Toileting    Toileting assist Assist for toileting: Dependent - Patient 0%     Transfers Chair/bed transfer  Transfers assist  Chair/bed transfer activity did not occur: Safety/medical concerns (unable 2/2 pain, fatigue, weakness)  Chair/bed transfer assist level: 2 Helpers     Locomotion Ambulation   Ambulation assist   Ambulation activity did not occur: Safety/medical concerns (unable 2/2 pain, fatigue, weakness)          Walk 10 feet activity   Assist  Walk 10 feet activity did not occur: Safety/medical concerns        Walk 50 feet activity   Assist Walk 50 feet with 2 turns activity did not occur: Safety/medical concerns         Walk 150 feet activity   Assist Walk 150 feet activity did not occur: Safety/medical concerns         Walk 10 feet on uneven surface  activity   Assist Walk 10 feet on uneven surfaces activity did not occur: Safety/medical concerns         Wheelchair     Assist Is the  patient using a wheelchair?: Yes Type of Wheelchair: Manual Wheelchair activity did not occur: Safety/medical concerns (unable 2/2 pain, fatigue, weakness)    Max wheelchair distance: 0    Wheelchair 50 feet with 2 turns activity    Assist    Wheelchair 50 feet with 2 turns activity did not occur: Safety/medical concerns       Wheelchair 150 feet activity     Assist  Wheelchair 150 feet activity did not occur: Safety/medical concerns       Blood pressure (!) 152/76, pulse 86, temperature 98.4 F (36.9 C), resp. rate 15, height 5' 3"$  (1.6 m), weight 94.3 kg, SpO2 96 %.    Medical Problem List and Plan: 1. Functional deficits secondary to subdural hematoma and ICH             - patient may shower with PEG site covered             - ELOS/Goals: 14-16 days, mod assist PT, mod assist OT, mod assist SLP  - goal DC 2/28               - Does well communicating with writing board and fingers for y/n             - Family requests change from DNR to full code on admission; changed   - Continue CIR therapies including PT, OT, and SLP    - 2/6 ordered L PRAFO, WHO, and soft cervical collar to help with head positioning OOB  - 2/16: family meeting regarding Dispo plan, recommending fullm time skilled nursing care for significant deficits, family discussing [name] vs. Private care  2.  Antithrombotics: -DVT/anticoagulation:  Mechanical:  Antiembolism stockings, knee (TED hose) Bilateral lower extremities; add SCDs             -antiplatelet therapy: none   - Duplex LUE pending - 2/8 superficial vein thrombosis involving the left cephalic vein-- warm compresses and conservative treatment   3. Pain Management: Tylenol as needed  - improved   - 2/6: lidocaine patch to L shoulder for pain - benefitting    - 2/9: voltaren gel to L shoulder, b/l knees  - 2/15: schedule Tylenol 650 mg Q8H, gabapentin 300 mg TID Prn   - 2/16: schedule gabapentin 100 mg TID  4. Mood/Behavior/Sleep: LCSW  to evaluate and provide emotional support             -antipsychotic agents: n/a   5. Neuropsych/cognition: This patient is not capable of making decisions  on her own behalf.   6. Skin/Wound Care: Routine skin care checks - coccyx PI with scab  - routine care - LAL mattress  - Dressings under PEG tube site to keep area dry, as appears moisture trapping under button 2/9 - improved   7. Fluids/Electrolytes/Nutrition: Routine Is and Os and follow-up chemistries             -NPO; has PEG             -continue Glucerna 1.5 continuous Tfs  - 2/9: Add potasium supplementation 20 meq BID; 2/11 dced  - 2/12: Add 40 meq daily for K 3.4 ;Repeat in 2-3 days  - Protein calorie malnutrition - on Juven BID             - Consult dietary to follow for titration - BG mildly elevated but in good range given age, monitor  - 2/14: per dietary, cannot transition to bolus feeds d/t J tube. Looking into reducing hours of TF in daytime for ease of admin in home setting.   - 2/15: confirmed G tube with no J component; switching to bolus feeds. Will need to switch BG and insulin to [facility] and HS.   - 2/16: well controlled   Recent Labs    08/15/22 0808 08/15/22 1106 08/15/22 1602  GLUCAP 186* 183* 114*        8: Hypertension: monitor TID and prn             -continue amlodipine 10 mg daily             -losartan 50 mg daily -> Dced 2/3 d/t concern for angioedema               - Add hydralazine 10 mg PRN for SBP >180, DBP>100    08/15/2022    7:31 PM 08/15/2022    3:53 PM 08/15/2022    8:43 AM  Vitals with BMI  Systolic [id number]  Diastolic 76 76 85  Pulse 86 80 79   2/4: BP stable/high; mild tachycardia this AM; if AM labs do not show dehydration, would start HCTZ 12.5 mg daily for adjunctive control  2/5 BP with improvement over last 12 hours--will hold off on adding any further medication right now.  2/6 - mild HTN, no med changes, labs in AM  2/7 -   HTN remains mild; start hctz 6.325 mg per tube  daily; labs in AM 2/9  2/9: DC HCTZ, switch to Coreg 3.125 BID, DC K supplement  2/12: Monitor, if consistently HTN can increase coreg  2/13: increase to 6.5 mg BID - 2/15 well controlled  9: Hyperlipidemia: continue statin, Zetia   10: SDH/ICH s/p crani: Finished Keppra for sz ppx.    11: Leukocytosis: current temperature 99.3, ceftazidime completed 1/29 - resolved             -Follow-up CBC with differential, mild WBC increase 2.2 likely d/t POD#1; downtrending 2/3               - Fever overnight prior to admission; POD from PEG, monitor for recurrence  - No fever recurrence 2/3; monitor  - 2/6: Labs in AM. CXR today for course lung sounds, and duplex for LUE swelling. Strict oral care QID    - 2/7: AM labs with mild subsegmental atelectasis on R; encourage ISB  -2/10 WBC 7.0 on 2/7, no signs of infection  12: Anemia, multifactorial:               -  Follow-up CBC -hgb with sl decrease again today, normocytic  -check stool for OB  -PA added b-supp. Add Fe++ too  - 2/9: transfuse 1 unit for HgB 6.4 today. FOBT pending.   2/10 FOBT pos, recheck, recheck H&H tomorrow, consider GI consult  2/11 H/H stable, spoke with GI, no intervention at this time supportive care, GI ordered BMP,  re consult if HGB continues to decrease, appreciate assistance   2/12: HgB stable   13. ?Angioedema vs. Chlorhexadine reaction - improving, appears no dependent             - Improving per family after DC chlorhexidine skin wipes and oral mouthwash              - Per SLP notes, interferes with tongue movement, speaking             - Hx cough with Enalipril, Losartan increased from OP 25 mg to 50 mg 1/14, never weaned; edema started 1/30 per chart review             - d/w pharmacy 2/4 and agree that reaction can occur at any time, and may cross-react with ARB. Will attempt to titrate BP meds without ACE/ARBs and monitor swelling.    - reposition head in bed to avoid dependent edema  14. L hemineglect - causing  R gaze preference and positional tone             - Repositioning in bed to straighten head--head more neutral today 2/5             - May need to consider botox given need to recover cognitive function and relatively little tone elsewhere  - 2/6: E stim and taping to L shoulder for mild sublux per OT.   15. Urinary incontinence - ongoing  - Foley removed 2/6  - PVRs QID and ISC for >350 ccs retention  - 2/7: 1x PVR low; monitor; ? Incontinence vs mixed, will consider timed toiletting after more information gathered  - 2/12: Start timed toiletting Q4H for urgency  16. Dysphagia, s/p PEG placement.   - Remains NPO  - Strict oral care QID   - CXR  given course breath sounds, weak cough, poor secretion clearance - R subsegmental atelectatis, encourage ISB  17. Diarrhea - stable/improving  - loose stools likely d/t tube feeds; on fibercon; will add immodium for >2 diarrhea BM in 24 hours   - 2/15: daily Bms now, improved LOS: 13 days A FACE TO [place] 08/15/2022, 8:25 PM

## 2022-08-16 LAB — GLUCOSE, CAPILLARY
Glucose-Capillary: 105 mg/dL — ABNORMAL HIGH (ref 70–99)
Glucose-Capillary: 127 mg/dL — ABNORMAL HIGH (ref 70–99)
Glucose-Capillary: 151 mg/dL — ABNORMAL HIGH (ref 70–99)
Glucose-Capillary: 162 mg/dL — ABNORMAL HIGH (ref 70–99)
Glucose-Capillary: 166 mg/dL — ABNORMAL HIGH (ref 70–99)
Glucose-Capillary: 170 mg/dL — ABNORMAL HIGH (ref 70–99)

## 2022-08-16 MED ORDER — INSULIN ASPART 100 UNIT/ML IJ SOLN
0.0000 [IU] | Freq: Three times a day (TID) | INTRAMUSCULAR | Status: DC
  Administered 2022-08-16: 3 [IU] via SUBCUTANEOUS
  Administered 2022-08-17: 2 [IU] via SUBCUTANEOUS
  Administered 2022-08-17: 3 [IU] via SUBCUTANEOUS
  Administered 2022-08-17 – 2022-08-18 (×2): 5 [IU] via SUBCUTANEOUS
  Administered 2022-08-18 (×2): 3 [IU] via SUBCUTANEOUS
  Administered 2022-08-19: 5 [IU] via SUBCUTANEOUS
  Administered 2022-08-19 (×2): 3 [IU] via SUBCUTANEOUS
  Administered 2022-08-20: 2 [IU] via SUBCUTANEOUS
  Administered 2022-08-20: 11 [IU] via SUBCUTANEOUS
  Administered 2022-08-21: 5 [IU] via SUBCUTANEOUS
  Administered 2022-08-21: 2 [IU] via SUBCUTANEOUS
  Administered 2022-08-21: 3 [IU] via SUBCUTANEOUS
  Administered 2022-08-22 (×2): 2 [IU] via SUBCUTANEOUS

## 2022-08-16 MED ORDER — INSULIN ASPART 100 UNIT/ML IJ SOLN: 0.0000 [IU] | Freq: Every day | INTRAMUSCULAR | Status: DC

## 2022-08-16 NOTE — Progress Notes (Signed)
PROGRESS NOTE   Subjective/Complaints:  Pt without any complaints today, slept ok overnight, pain doing ok today. Had a BM this morning. Urinating well. No other complaints or concerns, although limited due to communication difficulties.   ROS: limited due to language/communication +L shoulder pain - ongoing, improving; PEG and knee pain improved  Objective:   No results found. No results for input(s): "WBC", "HGB", "HCT", "PLT" in the last 72 hours.  No results for input(s): "NA", "K", "CL", "CO2", "GLUCOSE", "BUN", "CREATININE", "CALCIUM" in the last 72 hours.  No intake or output data in the 24 hours ending 08/16/22 0729         Physical Exam: Vital Signs Blood pressure 135/65, pulse 79, temperature 97.8 F (36.6 C), resp. rate 15, height 5' 3"$  (1.6 m), weight 93.9 kg, SpO2 97 %.  Constitutional: No distress . Vital signs reviewed. Appears comfortable in bed, daughter at bedside HEENT: NCAT, lip swelling, particular lower - R>L, appears dependent, also with eyelid swelling; no drooling, lips/tongue with thicker secretions cleared by daughter Neck: supple, forward flexed. Has inflatable neck pillow.  Cardiovascular: RRR without murmur. No JVD    Respiratory/Chest: CTAB. No RRW, Normal effort.  GI/Abdomen: Normal active bowel sounds, soft, nontender +PEG, c/d/i Ext: no clubbing, cyanosis. LUE 1+ Edema - stable Psych: pleasant and cooperative  Skin: C/D/I. No apparent lesions.    Prior exam: + Sacral wound  - scabbed - improved  MSK:      1/2 fingerbreadth subluxation L shoulder - stable      Strength:                 LUE: Flaccid , 0/5 throughout                LLE:  antigravity HF, KE   Neurologic exam:  Cognition: AAO to person, place, and time. Follows simple commands, reacts to verbal input. Improving dysarthric verbalizations     Insight: Fair insight into current condition. Knows she had a stroke.   +  LUE Hoffman's; +L facial droop. + L hemineglect, eyes can now pass midline. + L shoulder weakness--no change Spasticity:  none  Assessment/Plan: 1. Functional deficits which require 3+ hours per day of interdisciplinary therapy in a comprehensive inpatient rehab setting. Physiatrist is providing close team supervision and 24 hour management of active medical problems listed below. Physiatrist and rehab team continue to assess barriers to discharge/monitor patient progress toward functional and medical goals  Care Tool:  Bathing    Body parts bathed by patient: Face   Body parts bathed by helper: Left lower leg, Right lower leg, Left upper leg, Right upper leg, Buttocks, Front perineal area, Abdomen, Chest, Left arm, Right arm     Bathing assist Assist Level: Total Assistance - Patient < 25%     Upper Body Dressing/Undressing Upper body dressing   What is the patient wearing?: Pull over shirt    Upper body assist Assist Level: Total Assistance - Patient < 25%    Lower Body Dressing/Undressing Lower body dressing      What is the patient wearing?: Pants, Incontinence brief     Lower body assist Assist for lower body  dressing: Dependent - Patient 0%     Toileting Toileting    Toileting assist Assist for toileting: Dependent - Patient 0%     Transfers Chair/bed transfer  Transfers assist  Chair/bed transfer activity did not occur: Safety/medical concerns (unable 2/2 pain, fatigue, weakness)  Chair/bed transfer assist level: 2 Helpers     Locomotion Ambulation   Ambulation assist   Ambulation activity did not occur: Safety/medical concerns (unable 2/2 pain, fatigue, weakness)          Walk 10 feet activity   Assist  Walk 10 feet activity did not occur: Safety/medical concerns        Walk 50 feet activity   Assist Walk 50 feet with 2 turns activity did not occur: Safety/medical concerns         Walk 150 feet activity   Assist Walk 150 feet  activity did not occur: Safety/medical concerns         Walk 10 feet on uneven surface  activity   Assist Walk 10 feet on uneven surfaces activity did not occur: Safety/medical concerns         Wheelchair     Assist Is the patient using a wheelchair?: Yes Type of Wheelchair: Manual Wheelchair activity did not occur: Safety/medical concerns (unable 2/2 pain, fatigue, weakness)    Max wheelchair distance: 0    Wheelchair 50 feet with 2 turns activity    Assist    Wheelchair 50 feet with 2 turns activity did not occur: Safety/medical concerns       Wheelchair 150 feet activity     Assist  Wheelchair 150 feet activity did not occur: Safety/medical concerns       Blood pressure 135/65, pulse 79, temperature 97.8 F (36.6 C), resp. rate 15, height 5' 3"$  (1.6 m), weight 93.9 kg, SpO2 97 %.    Medical Problem List and Plan: 1. Functional deficits secondary to subdural hematoma and ICH             - patient may shower with PEG site covered - ELOS/Goals: 14-16 days, mod assist PT, mod assist OT, mod assist SLP  - goal DC 2/28              - Does well communicating with writing board and fingers for y/n             - Family requests change from DNR to full code on admission; changed   - Continue CIR therapies including PT, OT, and SLP  - 2/6 ordered L PRAFO, WHO, and soft cervical collar to help with head positioning OOB - 2/16: family meeting regarding Dispo plan, recommending fullm time skilled nursing care for significant deficits, family discussing [name] vs. Private care  2.  Antithrombotics: -DVT/anticoagulation:  Mechanical:  Antiembolism stockings, knee (TED hose) Bilateral lower extremities; add SCDs             -antiplatelet therapy: none - Duplex LUE - 2/8 superficial vein thrombosis involving the left cephalic vein-- warm compresses and conservative treatment   3. Pain Management: Tylenol and Robaxin 570m q6h as needed  - improved   - 2/6:  lidocaine patch to L shoulder for pain - benefitting    - 2/9: voltaren gel 2g to L shoulder, b/l knees  - 2/15: schedule Tylenol 650 mg Q8H, gabapentin 300 mg TID Prn   - 2/16: schedule gabapentin 100 mg TID  4. Mood/Behavior/Sleep: LCSW to evaluate and provide emotional support             -  antipsychotic agents: n/a  -Trazodone PRN   5. Neuropsych/cognition: This patient is not capable of making decisions on her own behalf.   6. Skin/Wound Care: Routine skin care checks - coccyx PI with scab  - routine care - LAL mattress - Dressings under PEG tube site to keep area dry, as appears moisture trapping under button 2/9 - improved   7. Fluids/Electrolytes/Nutrition: Routine Is and Os and follow-up chemistries             -NPO; has PEG             -continue Glucerna 1.5 continuous Tfs  - 2/9: Add potasium supplementation 20 meq BID; 2/11 dced - 2/12: Add 40 meq daily for K 3.4 ;Repeat in 2-3 days--not done, f/up on next labs 08/18/22  - Protein calorie malnutrition - on Juven BID, vitamins - Consult dietary to follow for titration - BG mildly elevated but in good range given age, monitor - 2/14: per dietary, cannot transition to bolus feeds d/t J tube. Looking into reducing hours of TF in daytime for ease of admin in home setting.  - 2/15: confirmed G tube with no J component; switching to bolus feeds. Will need to switch BG and insulin to [facility] and HS.   - 08/16/22: well controlled, changed CBG checks and SSI to TID AC+QHS   Recent Labs    08/16/22 0358 08/16/22 0802 08/16/22 1145  GLUCAP 127* 170* 151*       8: Hypertension: monitor TID and prn             -continue amlodipine 10 mg daily             -losartan 50 mg daily -> Dced 2/3 d/t concern for angioedema               - Add hydralazine 10 mg q8h PRN for SBP >180, DBP>100 -2/4: BP stable/high; mild tachycardia this AM; if AM labs do not show dehydration, would start HCTZ 12.5 mg daily for adjunctive control -2/5 BP with  improvement over last 12 hours--will hold off on adding any further medication right now.  -2/6 - mild HTN, no med changes, labs in AM  -2/7 -   HTN remains mild; start hctz 6.325 mg per tube daily; labs in AM 2/9  -2/9: DC HCTZ, switch to Coreg 3.125 BID, DC K supplement  -2/12: Monitor, if consistently HTN can increase coreg  -2/13: increase to 6.5 mg BID  - 08/16/22 BP/HR well controlled, cont regimen (amlodipine 3m QD, Coreg 6.261mBID, Hydralazine 1093m8h PRN) Vitals:   08/13/22 0328 08/13/22 1327 08/13/22 1932 08/14/22 0351  BP: (!) 143/80 (!) 148/66 (!) 156/74 136/74   08/14/22 1425 08/14/22 1951 08/15/22 0450 08/15/22 0840  BP: 137/82 136/85 (!) 134/90 (!) 149/85   08/15/22 0843 08/15/22 1553 08/15/22 1931 08/16/22 0402  BP: (!) 149/85 110/76 (!) 152/76 135/65     9: Hyperlipidemia: continue Lipitor 41m28m, Zetia 10mg86m  10: SDH/ICH s/p crani: Finished Keppra for sz ppx.    11: Leukocytosis: current temperature 99.3, ceftazidime completed 1/29 - resolved -Follow-up CBC with differential, mild WBC increase 2.2 likely d/t POD#1; downtrending 2/3 - Fever overnight prior to admission; POD from PEG, monitor for recurrence  - No fever recurrence 2/3; monitor - 2/6: Labs in AM. CXR today for course lung sounds, and duplex for LUE swelling. Strict oral care QID    - 2/7: AM labs with mild subsegmental atelectasis on R; encourage  ISB  -2/10 WBC 7.0 on 2/7, no signs of infection  -08/11/22 WBC 6.4, cont to monitor on weekly labs, next 08/18/22  12: Anemia, multifactorial:               -Follow-up CBC -hgb with sl decrease again today, normocytic  -check stool for OB  -PA added b-supp. Add Fe++ too  - 2/9: transfuse 1 unit for HgB 6.4 today. FOBT pending.   -2/10 FOBT pos, recheck, recheck H&H tomorrow, consider GI consult -2/11 H/H stable, spoke with GI, no intervention at this time supportive care, GI ordered BMP,  re consult if HGB continues to decrease, appreciate assistance    -2/12: HgB stable, monitor on weekly labs next 08/18/22   13. ?Angioedema vs. Chlorhexadine reaction - improving, appears no dependent - Improving per family after DC chlorhexidine skin wipes and oral mouthwash              - Per SLP notes, interferes with tongue movement, speaking - Hx cough with Enalipril, Losartan increased from OP 25 mg to 50 mg 1/14, never weaned; edema started 1/30 per chart review - d/w pharmacy 2/4 and agree that reaction can occur at any time, and may cross-react with ARB. Will attempt to titrate BP meds without ACE/ARBs and monitor swelling.   - reposition head in bed to avoid dependent edema  14. L hemineglect - causing R gaze preference and positional tone             - Repositioning in bed to straighten head--head more neutral today 2/5 - May need to consider botox given need to recover cognitive function and relatively little tone elsewhere  - 2/6: E stim and taping to L shoulder for mild sublux per OT.   15. Urinary incontinence - ongoing  - Foley removed 2/6  - PVRs QID and ISC for >350 ccs retention - 2/7: 1x PVR low; monitor; ? Incontinence vs mixed, will consider timed toiletting after more information gathered  - 2/12: Start timed toiletting Q4H for urgency  16. Dysphagia, s/p PEG placement.   - Remains NPO  - Strict oral care QID  - CXR  given course breath sounds, weak cough, poor secretion clearance - R subsegmental atelectasis, encourage ISB  17. Diarrhea - stable/improving - loose stools likely d/t tube feeds; on fibercon; will add immodium for >2 diarrhea BM in 24 hours   - 2/15: daily Bms now, improved  -08/16/22 BM x2 today, cont to monitor.    LOS: 14 days A FACE TO [place] 08/16/2022, 7:29 AM

## 2022-08-16 NOTE — Plan of Care (Signed)
  Problem: Consults Goal: RH BRAIN INJURY PATIENT EDUCATION Description: Description: See Patient Education module for eduction specifics Outcome: Progressing Goal: Skin Care Protocol Initiated - if Braden Score 18 or less Description: If consults are not indicated, leave blank or document N/A Outcome: Progressing Goal: Nutrition Consult-if indicated Outcome: Progressing   Problem: RH BOWEL ELIMINATION Goal: RH STG MANAGE BOWEL WITH ASSISTANCE Description: STG Manage Bowel with mod Assistance. Outcome: Progressing Goal: RH STG MANAGE BOWEL W/MEDICATION W/ASSISTANCE Description: STG Manage Bowel with Medication with mod Assistance. Outcome: Progressing   Problem: RH BLADDER ELIMINATION Goal: RH STG MANAGE BLADDER WITH ASSISTANCE Description: STG Manage Bladder With mod Assistance Outcome: Progressing Goal: RH STG MANAGE BLADDER WITH EQUIPMENT WITH ASSISTANCE Description: STG Manage Bladder With Equipment With min Assistance Outcome: Progressing   Problem: RH SKIN INTEGRITY Goal: RH STG SKIN FREE OF INFECTION/BREAKDOWN Description: Pressure injuries will improve while in rehab with min assist Outcome: Progressing Goal: RH STG MAINTAIN SKIN INTEGRITY WITH ASSISTANCE Description: STG Maintain Skin Integrity With min Assistance. Outcome: Progressing Goal: RH STG ABLE TO PERFORM INCISION/WOUND CARE W/ASSISTANCE Description: STG Able To Perform Incision/Wound Care With min Assistance. Outcome: Progressing   Problem: RH SAFETY Goal: RH STG ADHERE TO SAFETY PRECAUTIONS W/ASSISTANCE/DEVICE Description: STG Adhere to Safety Precautions With cueing Assistance/Device. Outcome: Progressing   Problem: RH COGNITION-NURSING Goal: RH STG USES MEMORY AIDS/STRATEGIES W/ASSIST TO PROBLEM SOLVE Description: STG Uses Memory Aids/Strategies With cueing Assistance to Problem Solve. Outcome: Progressing   Problem: RH PAIN MANAGEMENT Goal: RH STG PAIN MANAGED AT OR BELOW PT'S PAIN  GOAL Description: Pain will be managed at 4 out of 10 on pain scale with PRN medications min assist Outcome: Progressing   Problem: RH KNOWLEDGE DEFICIT BRAIN INJURY Goal: RH STG INCREASE KNOWLEDGE OF SELF CARE AFTER BRAIN INJURY Description: Patient/caregiver will be able manage mediations, wounds, and self care from nursing education and nursing handouts independently Outcome: Progressing Goal: RH STG INCREASE KNOWLEDGE OF DYSPHAGIA/FLUID INTAKE Description: Patient/caregiver will be able to manage appropriate diet from nursing education and nursing handouts independently  Outcome: Progressing

## 2022-08-16 NOTE — Progress Notes (Signed)
Physical Therapy Session Note  Patient Details  Name: Gina Kelley MRN: [id number] Date of Birth: 1936/05/04  Today's Date: 08/16/2022 PT Individual Time: 1300-1345 PT Individual Time Calculation (min): 45 min   Short Term Goals: Week 2:  PT Short Term Goal 1 (Week 2): Patient will perform bed/chair transfer with LRAD and ModA PT Short Term Goal 2 (Week 2): Patient will maintain dynamic sitting balance with CGA PT Short Term Goal 3 (Week 2): Patient will initiate sit/stand with LRAD  Skilled Therapeutic Interventions/Progress Updates:  Patient greeted supine, asleep in bed but easily arousable and agreeable to PT treatment session. While supine, therapist threaded B feet through pants and patient rolled L and R with MaxA in order for therapist and rehab tech to pull pants over hips. Patient transitioned from supine to sitting EOB with Mod/MaxA- patient was able to initiate placing B LE off the bed, however required assistance for fully placing her legs off the bed and then for righting trunk due to decreased strength and motor planning. While sitting EOB, patient demonstrated good sitting balance and only required SBA for safety with decreased pushing noted. Patient performed squat pivot transfer from EOB to TIS wheelchair with [place]- Patient was able to count herself to "3" and then demonstrated good B LE activation to lift bottom off the bed. Patient wheeled to rehab gym and completed the standing frame x11 minutes for improved upright tolerance, midline posturing and overall postural extension. Patient tasked with locating squigz on her L side, reaching for a certain color with her R UE and then leaning to her R to hand the squigz to rehab tech in order to promote a R lateral weight shift. Patient completed this task x6 trials with intermittent hand-over-hand assistance for lifting R UE fully off the standing frame platform and for an improve R lateral lean. Patient noted to be incontinent of  urine and bowels while in the standing frame so returned to her room and transferred back to bed same as above. Patient transitioned from sitting EOB to supine with Mod/MaxA for trunk and B LE management- Patient was able to transition onto R forearm in preparation for bed mobility. NT notified, aware and agreeable to assisting with pericare. Patient left supine in bed with call bell within reach and all needs met.    Therapy Documentation Precautions:  Precautions Precautions: Fall Precaution Comments: peg tube. HOB no lower than 30*. Left dense hemi. right gaze preference, left side inattention Required Braces or Orthoses: Other Brace Other Brace: PRAFO boots on in bed Restrictions Weight Bearing Restrictions: No   Therapy/Group: Individual Therapy  Zenith Kercheval 08/16/2022, 12:38 PM

## 2022-08-17 LAB — GLUCOSE, CAPILLARY
Glucose-Capillary: 140 mg/dL — ABNORMAL HIGH (ref 70–99)
Glucose-Capillary: 152 mg/dL — ABNORMAL HIGH (ref 70–99)
Glucose-Capillary: 215 mg/dL — ABNORMAL HIGH (ref 70–99)
Glucose-Capillary: 138 mg/dL — ABNORMAL HIGH (ref 70–99)

## 2022-08-17 NOTE — Progress Notes (Signed)
PROGRESS NOTE   Subjective/Complaints:  Pt without any complaints again today. Denies any pain currently, slept well. Just had a BM and would like to be changed, and also had BMs yesterday. Denies other complaints or concerns although limited due to language/communication.   ROS: limited due to language/communication +L shoulder pain - ongoing, improving; PEG and knee pain improved  Objective:   No results found. No results for input(s): "WBC", "HGB", "HCT", "PLT" in the last 72 hours.  No results for input(s): "NA", "K", "CL", "CO2", "GLUCOSE", "BUN", "CREATININE", "CALCIUM" in the last 72 hours.   Intake/Output Summary (Last 24 hours) at 08/17/2022 1220 Last data filed at 08/17/2022 0700 Gross per 24 hour  Intake 480 ml  Output 0 ml  Net 480 ml          Physical Exam: Vital Signs Blood pressure 126/73, pulse 75, temperature 97.8 F (36.6 C), temperature source Oral, resp. rate 18, height 5' 3"$  (1.6 m), weight 95.7 kg, SpO2 97 %.  Constitutional: No distress . Vital signs reviewed. Appears comfortable in bed HEENT: NCAT, lip swelling, particular lower - R>L, appears dependent, also with eyelid swelling-both stable; no drooling, lips/tongue with thicker secretions which were suctioned and pt appreciative.  Neck: supple, forward flexed. Has inflatable neck pillow which was readjusted.   Cardiovascular: RRR without murmur. No JVD    Respiratory/Chest: CTAB. No RRW, Normal effort.  GI/Abdomen: Normal active bowel sounds, soft, nontender +PEG, c/d/i Ext: no clubbing, cyanosis. LUE 1+ Edema - stable, has compression sleeve on Psych: pleasant and cooperative  Skin: C/D/I. No apparent lesions.    Prior exam: + Sacral wound  - scabbed - improved  MSK:      1/2 fingerbreadth subluxation L shoulder - stable      Strength:                 LUE: Flaccid , 0/5 throughout                LLE:  antigravity HF, KE   Neurologic  exam:  Cognition: AAO to person, place, and time. Follows simple commands, reacts to verbal input. Improving dysarthric verbalizations     Insight: Fair insight into current condition. Knows she had a stroke.   + LUE Hoffman's; +L facial droop. + L hemineglect, eyes can now pass midline. + L shoulder weakness--no change Spasticity:  none  Assessment/Plan: 1. Functional deficits which require 3+ hours per day of interdisciplinary therapy in a comprehensive inpatient rehab setting. Physiatrist is providing close team supervision and 24 hour management of active medical problems listed below. Physiatrist and rehab team continue to assess barriers to discharge/monitor patient progress toward functional and medical goals  Care Tool:  Bathing    Body parts bathed by patient: Face   Body parts bathed by helper: Left lower leg, Right lower leg, Left upper leg, Right upper leg, Buttocks, Front perineal area, Abdomen, Chest, Left arm, Right arm     Bathing assist Assist Level: Total Assistance - Patient < 25%     Upper Body Dressing/Undressing Upper body dressing   What is the patient wearing?: Pull over shirt    Upper body assist Assist  Level: Total Assistance - Patient < 25%    Lower Body Dressing/Undressing Lower body dressing      What is the patient wearing?: Pants, Incontinence brief     Lower body assist Assist for lower body dressing: Dependent - Patient 0%     Toileting Toileting    Toileting assist Assist for toileting: Dependent - Patient 0%     Transfers Chair/bed transfer  Transfers assist  Chair/bed transfer activity did not occur: Safety/medical concerns (unable 2/2 pain, fatigue, weakness)  Chair/bed transfer assist level: 2 Helpers     Locomotion Ambulation   Ambulation assist   Ambulation activity did not occur: Safety/medical concerns (unable 2/2 pain, fatigue, weakness)          Walk 10 feet activity   Assist  Walk 10 feet activity did  not occur: Safety/medical concerns        Walk 50 feet activity   Assist Walk 50 feet with 2 turns activity did not occur: Safety/medical concerns         Walk 150 feet activity   Assist Walk 150 feet activity did not occur: Safety/medical concerns         Walk 10 feet on uneven surface  activity   Assist Walk 10 feet on uneven surfaces activity did not occur: Safety/medical concerns         Wheelchair     Assist Is the patient using a wheelchair?: Yes Type of Wheelchair: Manual Wheelchair activity did not occur: Safety/medical concerns (unable 2/2 pain, fatigue, weakness)    Max wheelchair distance: 0    Wheelchair 50 feet with 2 turns activity    Assist    Wheelchair 50 feet with 2 turns activity did not occur: Safety/medical concerns       Wheelchair 150 feet activity     Assist  Wheelchair 150 feet activity did not occur: Safety/medical concerns       Blood pressure 126/73, pulse 75, temperature 97.8 F (36.6 C), temperature source Oral, resp. rate 18, height 5' 3"$  (1.6 m), weight 95.7 kg, SpO2 97 %.    Medical Problem List and Plan: 1. Functional deficits secondary to subdural hematoma and ICH             - patient may shower with PEG site covered - ELOS/Goals: 14-16 days, mod assist PT, mod assist OT, mod assist SLP  - goal DC 08/27/22             - Does well communicating with writing board and fingers for y/n             - Family requests change from DNR to full code on admission; changed   - Continue CIR therapies including PT, OT, and SLP  - 2/6 ordered L PRAFO, WHO, and soft cervical collar to help with head positioning OOB - 2/16: family meeting regarding Dispo plan, recommending full time skilled nursing care for significant deficits, family discussing [name] vs. Private care  2.  Antithrombotics: -DVT/anticoagulation:  Mechanical:  Antiembolism stockings, knee (TED hose) Bilateral lower extremities; add SCDs              -antiplatelet therapy: none - Duplex LUE - 2/8 superficial vein thrombosis involving the left cephalic vein-- warm compresses and conservative treatment   3. Pain Management: Tylenol and Robaxin 526m q6h as needed  - improved   - 2/6: lidocaine patch to L shoulder for pain - benefitting    - 2/9: voltaren gel 2g to L shoulder,  b/l knees  - 2/15: schedule Tylenol 650 mg Q8H, gabapentin 300 mg TID Prn   - 2/16: schedule gabapentin 100 mg TID  4. Mood/Behavior/Sleep: LCSW to evaluate and provide emotional support             -antipsychotic agents: n/a  -Trazodone PRN   5. Neuropsych/cognition: This patient is not capable of making decisions on her own behalf.   6. Skin/Wound Care: Routine skin care checks - coccyx PI with scab  - routine care - LAL mattress - Dressings under PEG tube site to keep area dry, as appears moisture trapping under button 2/9 - improved   7. Fluids/Electrolytes/Nutrition: Routine Is and Os and follow-up chemistries             -NPO; has PEG             -continue Glucerna 1.5 continuous Tfs  - 2/9: Add potasium supplementation 20 meq BID; 2/11 dced - 2/12: Add 40 meq daily for K 3.4 ;Repeat in 2-3 days--not done, f/up on next labs 08/18/22  - Protein calorie malnutrition - on Juven BID, vitamins - Consult dietary to follow for titration - BG mildly elevated but in good range given age, monitor - 2/14: per dietary, cannot transition to bolus feeds d/t J tube. Looking into reducing hours of TF in daytime for ease of admin in home setting.  - 2/15: confirmed G tube with no J component; switching to bolus feeds. Will need to switch BG and insulin to [facility], In and HS.   - 08/16/22: well controlled, changed CBG checks and SSI to TID AC+QHS  -08/17/22 CBGs looking great, cont to monitor   Recent Labs    08/16/22 2211 08/17/22 0607 08/17/22 1132  GLUCAP 105* 140* 152*      8: Hypertension: monitor TID and prn             -continue amlodipine 10 mg daily              -losartan 50 mg daily -> Dced 2/3 d/t concern for angioedema               - Add hydralazine 10 mg q8h PRN for SBP >180, DBP>100 -2/4: BP stable/high; mild tachycardia this AM; if AM labs do not show dehydration, would start HCTZ 12.5 mg daily for adjunctive control -2/5 BP with improvement over last 12 hours--will hold off on adding any further medication right now.  -2/6 - mild HTN, no med changes, labs in AM  -2/7 -   HTN remains mild; start hctz 6.325 mg per tube daily; labs in AM 2/9  -2/9: DC HCTZ, switch to Coreg 3.125 BID, DC K supplement  -2/12: Monitor, if consistently HTN can increase coreg  -2/13: increase to 6.5 mg BID  - 08/17/22 BP/HR well controlled, cont regimen (amlodipine 45m QD, Coreg 6.264mBID, Hydralazine 1059m8h PRN) Vitals:   08/13/22 1932 08/14/22 0351 08/14/22 1425 08/14/22 1951  BP: (!) 156/74 136/74 137/82 136/85   08/15/22 0450 08/15/22 0840 08/15/22 0843 08/15/22 1553  BP: (!) 134/90 (!) 149/85 (!) 149/85 110/76   08/15/22 1931 08/16/22 0402 08/16/22 1924 08/17/22 0437  BP: (!) 152/76 135/65 (!) 141/64 126/73     9: Hyperlipidemia: continue Lipitor 64m62m, Zetia 10mg52m  10: SDH/ICH s/p crani: Finished Keppra for sz ppx.    11: Leukocytosis: current temperature 99.3, ceftazidime completed 1/29 - resolved -Follow-up CBC with differential, mild WBC increase 2.2 likely d/t POD#1; downtrending 2/3 -  Fever overnight prior to admission; POD from PEG, monitor for recurrence  - No fever recurrence 2/3; monitor - 2/6: Labs in AM. CXR today for course lung sounds, and duplex for LUE swelling. Strict oral care QID    - 2/7: AM labs with mild subsegmental atelectasis on R; encourage ISB  -2/10 WBC 7.0 on 2/7, no signs of infection  -08/11/22 WBC 6.4, cont to monitor on weekly labs, next 08/18/22  12: Anemia, multifactorial:               -Follow-up CBC -hgb with sl decrease again today, normocytic  -check stool for OB  -PA added b-supp. Add Fe++ too  - 2/9:  transfuse 1 unit for HgB 6.4 today. FOBT pending.   -2/10 FOBT pos, recheck, recheck H&H tomorrow, consider GI consult -2/11 H/H stable, spoke with GI, no intervention at this time supportive care, GI ordered BMP,  re consult if HGB continues to decrease, appreciate assistance   -2/12: HgB stable, monitor on weekly labs next 08/18/22   13. ?Angioedema vs. Chlorhexadine reaction - improving, appears no dependent - Improving per family after DC chlorhexidine skin wipes and oral mouthwash              - Per SLP notes, interferes with tongue movement, speaking - Hx cough with Enalipril, Losartan increased from OP 25 mg to 50 mg 1/14, never weaned; edema started 1/30 per chart review - d/w pharmacy 2/4 and agree that reaction can occur at any time, and may cross-react with ARB. Will attempt to titrate BP meds without ACE/ARBs and monitor swelling.   - reposition head in bed to avoid dependent edema  14. L hemineglect - causing R gaze preference and positional tone             - Repositioning in bed to straighten head--head more neutral today 2/5 - May need to consider botox given need to recover cognitive function and relatively little tone elsewhere  - 2/6: E stim and taping to L shoulder for mild sublux per OT.   15. Urinary incontinence - ongoing  - Foley removed 2/6  - PVRs QID and ISC for >350 ccs retention - 2/7: 1x PVR low; monitor; ? Incontinence vs mixed, will consider timed toiletting after more information gathered  - 2/12: Start timed toiletting Q4H for urgency  16. Dysphagia, s/p PEG placement.   - Remains NPO  - Strict oral care QID  - CXR  given course breath sounds, weak cough, poor secretion clearance - R subsegmental atelectasis, encourage ISB  17. Diarrhea - stable/improving - loose stools likely d/t tube feeds; on fibercon; will add immodium for >2 diarrhea BM in 24 hours   - 2/15: daily Bms now, improved  -08/16/22 BM x2 today, cont to monitor.   -08/17/22 1 BM this  morning during visit, but overall improving, monitor   LOS: 15 days A FACE TO [place] 08/17/2022, 12:20 PM

## 2022-08-18 ENCOUNTER — Inpatient Hospital Stay (HOSPITAL_COMMUNITY): Payer: Medicare HMO

## 2022-08-18 LAB — BASIC METABOLIC PANEL
Anion gap: 10 (ref 5–15)
BUN: 37 mg/dL — ABNORMAL HIGH (ref 8–23)
CO2: 30 mmol/L (ref 22–32)
Calcium: 8.5 mg/dL — ABNORMAL LOW (ref 8.9–10.3)
Chloride: 100 mmol/L (ref 98–111)
Creatinine, Ser: 0.58 mg/dL (ref 0.44–1.00)
GFR, Estimated: >60 mL/min (ref >60)
Glucose, Bld: 165 mg/dL — ABNORMAL HIGH (ref 70–99)
Potassium: 3.7 mmol/L (ref 3.5–5.1)
Sodium: 140 mmol/L (ref 135–145)

## 2022-08-18 LAB — CBC
HCT: 27.3 % — ABNORMAL LOW (ref 36.0–46.0)
Hemoglobin: 8.4 g/dL — ABNORMAL LOW (ref 12.0–15.0)
MCH: 28.6 pg (ref 26.0–34.0)
MCHC: 30.8 g/dL (ref 30.0–36.0)
MCV: 92.9 fL (ref 80.0–100.0)
Platelets: 284 10*3/uL (ref 150–400)
RBC: 2.94 MIL/uL — ABNORMAL LOW (ref 3.87–5.11)
RDW: 17.7 % — ABNORMAL HIGH (ref 11.5–15.5)
WBC: 6.4 10*3/uL (ref 4.0–10.5)
nRBC: 0 % (ref 0.0–0.2)

## 2022-08-18 LAB — GLUCOSE, CAPILLARY
Glucose-Capillary: 198 mg/dL — ABNORMAL HIGH (ref 70–99)
Glucose-Capillary: 174 mg/dL — ABNORMAL HIGH (ref 70–99)
Glucose-Capillary: 194 mg/dL — ABNORMAL HIGH (ref 70–99)
Glucose-Capillary: 203 mg/dL — ABNORMAL HIGH (ref 70–99)
Glucose-Capillary: 237 mg/dL — ABNORMAL HIGH (ref 70–99)
Glucose-Capillary: 152 mg/dL — ABNORMAL HIGH (ref 70–99)

## 2022-08-18 NOTE — Plan of Care (Signed)
Goals adjusted to more accurately reflect patient's current level of function and projected level of function upon discharge- SR 2/19.   Problem: Sit to Stand Goal: LTG:  Patient will perform sit to stand with assistance level (PT) Description: LTG:  Patient will perform sit to stand with assistance level (PT) Outcome: Not Applicable Flowsheets (Taken 08/18/2022 1554) LTG: PT will perform sit to stand in preparation for functional mobility with assistance level: (DC secondary to [place] 2/19) -- Note: DC secondary to [place] 2/19    Problem: RH Ambulation Goal: LTG Patient will ambulate in controlled environment (PT) Description: LTG: Patient will ambulate in a controlled environment, # of feet with assistance (PT). Outcome: Not Applicable Flowsheets (Taken 08/18/2022 1554) LTG: Pt will ambulate in controlled environ  assist needed:: (DC secondary to [place] 2/19) -- Note: DC secondary to [place] 2/19  Goal: LTG Patient will ambulate in home environment (PT) Description: LTG: Patient will ambulate in home environment, # of feet with assistance (PT). Outcome: Not Applicable Flowsheets (Taken 08/18/2022 1554) LTG: Pt will ambulate in home environ  assist needed:: (DC secondary to [place] 2/19) -- Note: DC secondary to CLOF- SR 2/19    Problem: RH Wheelchair Mobility Goal: LTG Patient will propel w/c in home environment (PT) Description: LTG: Patient will propel wheelchair in home environment, # of feet with assistance (PT). Outcome: Not Applicable Flowsheets (Taken 08/18/2022 1554) LTG: Pt will propel w/c in home environ  assist needed:: (DC secondary to CLOF- SR 2/19) -- Note: DC secondary to CLOF- SR 2/19    Problem: RH Stairs Goal: LTG Patient will ambulate up and down stairs w/assist (PT) Description: LTG: Patient will ambulate up and down # of stairs with assistance (PT) Outcome: Not Applicable Flowsheets (Taken 08/18/2022 1554) LTG: Pt will ambulate up/down stairs assist  needed:: (DC secondary to CLOF- SR 2/19) -- Note: DC secondary to CLOF- SR 2/19    Problem: RH Bed Mobility Goal: LTG Patient will perform bed mobility with assist (PT) Description: LTG: Patient will perform bed mobility with assistance, with/without cues (PT). Flowsheets (Taken 08/18/2022 1554) LTG: Pt will perform bed mobility with assistance level of: (Downgraded secondary to CLOF- SR 2/19) Moderate Assistance - Patient 50 - 74% Note: Downgraded secondary to [place] 2/19   Problem: RH Bed to Chair Transfers Goal: LTG Patient will perform bed/chair transfers w/assist (PT) Description: LTG: Patient will perform bed to chair transfers with assistance (PT). Flowsheets (Taken 08/18/2022 1554) LTG: Pt will perform Bed to Chair Transfers with assistance level: (Downgraded secondary to [place] 2/19) Moderate Assistance - Patient 50 - 74% Note: Downgraded secondary to [place] 2/19    Problem: RH Car Transfers Goal: LTG Patient will perform car transfers with assist (PT) Description: LTG: Patient will perform car transfers with assistance (PT). Flowsheets (Taken 08/18/2022 1554) LTG: Pt will perform car transfers with assist:: (Downgraded secondary to [place] 2/19) Moderate Assistance - Patient 50 - 74% Note: Downgraded secondary to [place] 2/19    Problem: RH Wheelchair Mobility Goal: LTG Patient will propel w/c in controlled environment (PT) Description: LTG: Patient will propel wheelchair in controlled environment, # of feet with assist (PT) Flowsheets (Taken 08/18/2022 1554) LTG: Pt will propel w/c in controlled environ  assist needed:: (Downgraded secondary to [place] 2/19) Moderate Assistance - Patient 50 - 74% LTG: Propel w/c distance in controlled environment: 50' Note: Downgraded secondary to [place] 2/19

## 2022-08-18 NOTE — Progress Notes (Signed)
Physical Therapy Weekly Progress Note  Patient Details  Name: Gina Kelley MRN: [id number] Date of Birth: 18-Jun-1936  Beginning of progress report period: August 03, 2022 End of progress report period: August 18, 2022  Today's Date: 08/18/2022 PT Individual Time: 1st Treatment Session: 0800-0900; 2nd Treatment Session: 1510-1540 PT Individual Time Calculation (min): 60 min; 30 min  Patient has met 0 of 3 short term goals. Patient has made minimal progress toward her week two short term goals. Patient continues to require Max/TotalA for transfers and bed mobility, MinA for dynamic sitting balance and standing continues to not be functional at this time.   Patient continues to demonstrate the following deficits muscle weakness and muscle joint tightness, decreased cardiorespiratoy endurance, impaired timing and sequencing, ataxia, decreased coordination, and decreased motor planning, R gaze preference, decreased midline orientation, decreased attention to left, and decreased motor planning, decreased initiation, decreased attention, decreased awareness, decreased problem solving, decreased safety awareness, decreased memory, and delayed processing, and decreased sitting balance, decreased standing balance, decreased postural control, hemiplegia, and decreased balance strategies and therefore will continue to benefit from skilled PT intervention to increase functional independence with mobility.  Patient not progressing toward long term goals.  See goal revision..  Plan of care revisions: Goals downgraded to [place] and all sit/stand, gait and stair mobility goals discontinued as they are not appropriate at this time.  PT Short Term Goals Week 1:  PT Short Term Goal 1 (Week 1): Pt will roll with max A or less PT Short Term Goal 1 - Progress (Week 1): Met PT Short Term Goal 2 (Week 1): Pt will perform supine to sit with max A PT Short Term Goal 2 - Progress (Week 1): Met PT Short Term Goal 3  (Week 1): Pt will require min A for static sitting balance PT Short Term Goal 3 - Progress (Week 1): Met Week 2:  PT Short Term Goal 1 (Week 2): Patient will perform bed/chair transfer with LRAD and ModA PT Short Term Goal 1 - Progress (Week 2): Not met PT Short Term Goal 2 (Week 2): Patient will maintain dynamic sitting balance with CGA PT Short Term Goal 2 - Progress (Week 2): Not met PT Short Term Goal 3 (Week 2): Patient will initiate sit/stand with LRAD PT Short Term Goal 3 - Progress (Week 2): Not met Week 3:  PT Short Term Goal 1 (Week 3): Patient will perform bed/chair transfer with LRAD and ModA PT Short Term Goal 2 (Week 3): Patient will maintain dynamic sitting balance with CGA PT Short Term Goal 3 (Week 3): Patient will initiate sit/stand with LRAD  Skilled Therapeutic Interventions/Progress Updates:  1st Treatment Session- Patient greeted supine, asleep in bed but eventually arousable and agreeable to PT treatment session. Patient noted to be soiled upon arrival- Patient rolled L and R several times with MaxA in order for therapist to perform pericare and don clean brief, as well as pants. Patient transitioned to sitting EOB with MaxA- Upon sitting EOB, patient demonstrated significant posterior and R lateral lean requiring Mod/MaxA for sitting balance. VC for improved midline posturing with minimal improvements noted. Therapist donned tshirt with TotalA while sitting EOB. Patient performed squat pivot transfer from EOB to wheelchair with MaxA- VC throughout for sequencing and anterior weight shift. Patient wheeled to rehab gym for time management.   Patient transferred to/from wheelchair and mat table via squat pivot and MaxA- Patient was able to verbalize "1,2,3" prior to initiating transfer with minimal effort noted. Patient  sat EOM x10 minutes while reaching outside her BOS to L and R for various colored bean bags. Patient required MAX multimodal cues for locating bean bags on her L  compared to Saturday's treatment session. Patient continued to present with R lateral and posterior lean requiring multimodal cues for midline posturing.   Patient returned to her room reclined in [place] wheelchair with call bell in lap, L UE elevated on pillow, belt on and all needs met.   2nd Treatment Session- Patient greeted supine in bed and agreeable to PT treatment session. Therapist performed PROM sustained stretching in order to improve flexibility for bed mobility and transfers-  B hip adductors B DF/PF B hamstrings  B hip internal/external rotation B knee to chest PROM, x10 each LE R upper trapezius stretch  R levator scapulae stretch  B anterior neck flexors stretch   Therapist positioned patient rotated onto her L side with pillows propped under her side, head and between B LE in order to encourage L head rotation to improve overall flexibility and decrease pain. Patient left with call bell within reach and all needs met.    Therapy Documentation Precautions:  Precautions Precautions: Fall Precaution Comments: peg tube. HOB no lower than 30*. Left dense hemi. right gaze preference, left side inattention Required Braces or Orthoses: Other Brace Other Brace: PRAFO boots on in bed Restrictions Weight Bearing Restrictions: No  Therapy/Group: Individual Therapy  Neelam Tiggs 08/18/2022, 7:57 AM

## 2022-08-18 NOTE — Progress Notes (Signed)
Speech Language Pathology Weekly Progress and Session Note  Patient Details  Name: Gina Kelley MRN: [id number] Date of Birth: [birth year]  Beginning of progress report period: August 11, 2022 End of progress report period: August 18, 2022  Today's Date: 08/18/2022 SLP Individual Time: 0900-0930 SLP Individual Time Calculation (min): 30 min and Today's Date: 08/18/2022 SLP Missed Time: 15 Minutes Missed Time Reason: X-ray (STAT)  Short Term Goals: Week 2: SLP Short Term Goal 1 (Week 2): Pt will communicate basic, immediate wants and needs via multimodal communication with mod assist multimodal cues. SLP Short Term Goal 1 - Progress (Week 2): Not met SLP Short Term Goal 2 (Week 2): Pt will sustain her attention to functional tasks for ~5 minute intervals with max cues for redirection. SLP Short Term Goal 2 - Progress (Week 2): Met SLP Short Term Goal 3 (Week 2): Pt will use overarticulation and increased vocal intensity to achieve intelligibility at the word level with max assist multimodal cues. SLP Short Term Goal 3 - Progress (Week 2): Met SLP Short Term Goal 4 (Week 2): Pt will locate objects at midline during functional tasks in 75% of opportunities with max assist multimodal cues. SLP Short Term Goal 4 - Progress (Week 2): Met SLP Short Term Goal 5 (Week 2): Pt will consume therapeutic trials of POs with small ice chips with max assist to contain boluses orally and initiate swallow in 75% of opportunities with minimal overt s/s of aspiration. SLP Short Term Goal 5 - Progress (Week 2): Not met    New Short Term Goals: Week 3: SLP Short Term Goal 1 (Week 3): Pt will sustain her attention to functional tasks for ~5 minute intervals with mod cues for redirection. SLP Short Term Goal 2 (Week 3): Pt will locate objects at midline during functional tasks in 75% of opportunities with mod assist multimodal cues. SLP Short Term Goal 3 (Week 3): Pt will use overarticulation and  increased vocal intensity to achieve 50% intelligibility at the word level with mod assist multimodal cues. SLP Short Term Goal 4 (Week 3): Pt willutilize written expression during communication breakdowns to express wants/needs with Max A multimodal cues. SLP Short Term Goal 5 (Week 3): Patient will consume trials of thin (wet oral swabs, 1/4 tsps) with minimal overt s/s of aspiration and Mod A multimodal cues for oral containment/swallow initation to assess readiness for MBS.  Weekly Progress Updates: Patient continues to make slow gains and has met 3 of 5 STGs this reporting period. Currently, patient remains NPO, however, patient has been participating in small trials of thin liquids via tsp with intermittent overt s/s of aspiration. Plan is for MBS this week to assess swallow function. Patient continues to require overall Mod-Max A multimodal cues for use of speech intelligibility strategies at the word and phrase level as well as for sustained attention and left visual scanning during functional tasks. Patient and family education ongoing. Patient would benefit from continued skilled SLP intervention to maximize her cognitive-linguistic and swallowing function as well as her functional communication prior to discharge.      Intensity: Minumum of 1-2 x/day, 30 to 90 minutes Frequency: 3 to 5 out of 7 days Duration/Length of Stay: 08/27/22 Treatment/Interventions: Cognitive remediation/compensation;Cueing hierarchy;Internal/external aids;Patient/family education;Functional tasks;Environmental controls;Speech/Language facilitation;Dysphagia/aspiration precaution training;Therapeutic Activities;Multimodal communication approach   Daily Session  Skilled Therapeutic Interventions:  Skilled treatment session focused on communication goals. Upon arrival, patient was awake while upright in the wheelchair. Trials were not attempted today as patient appeared  more lethargic with a STAT chest x-ray ordered.  Due to fatigue, patient required more than a reasonable amount of time to respond to questions. Today is the patient's birthday and patient required Min verbal cues to recall her appropriate age. Patient then sang Happy Birthday aloud with Mod verbal cues for use of speech intelligibility strategies. Patient participated in an attention, visual scanning and naming task in which patient had to name 2 functional items and pick up the item within her midline or slightly to the left of midline. Patient completed task with 80% accuracy and Mod verbal and auditory cues. Transport arrived to take patient to the STAT X-ray, therefore, patient missed remaining 15 minutes of session. Continue with current plan of care.    Pain No/Denies Pain   Therapy/Group: Individual Therapy  Gina Kelley 08/18/2022, 7:12 AM

## 2022-08-18 NOTE — Progress Notes (Addendum)
PROGRESS NOTE   Subjective/Complaints:  Pt without any complaints this AM. Per therapues, more lethergic than prior exams, no focal deficits. Pt states for her 87th birthday she wants to wish well to others.   ROS: limited due to language/communication +L shoulder pain - ongoing, improving; PEG and knee pain improved  Objective:   No results found. Recent Labs    08/18/22 0555  WBC 6.4  HGB 8.4*  HCT 27.3*  PLT 284    Recent Labs    08/18/22 0555  NA 140  K 3.7  CL 100  CO2 30  GLUCOSE 165*  BUN 37*  CREATININE 0.58  CALCIUM 8.5*     Intake/Output Summary (Last 24 hours) at 08/18/2022 0908 Last data filed at 08/18/2022 0500 Gross per 24 hour  Intake 480 ml  Output 1 ml  Net 479 ml           Physical Exam: Vital Signs Blood pressure 128/78, pulse 76, temperature 98.6 F (37 C), resp. rate 16, height 5' 3"$  (1.6 m), weight 95.7 kg, SpO2 97 %.  Constitutional: No distress . Vital signs reviewed. Appears comfortable in bed HEENT: NCAT, lip swelling, particular lower - R>L, appears dependent - stable   Neck: supple, forward flexed. Has inflatable neck pillow which was readjusted.   Cardiovascular: RRR without murmur. No JVD    Respiratory/Chest: Bibasilar crackles, fine. No RRW, Normal effort.  GI/Abdomen: Normal active bowel sounds, soft, nontender +PEG, c/d/i Ext: no clubbing, cyanosis. LUE 1+ Edema - stable Psych: pleasant and cooperative  Skin: C/D/I. No apparent lesions.    Prior exam: + Sacral wound  - scabbed - improved  MSK:      1/2 fingerbreadth subluxation L shoulder - stable      Strength:                 LUE: Flaccid , 0/5 throughout                LLE:  antigravity HF, KE   Neurologic exam:  Cognition: AAO to person, place, and time. Follows simple commands, reacts to verbal input. Improving dysarthric verbalizations     Insight: Fair insight into current condition. Knows she had  a stroke.   + LUE Hoffman's; +L facial droop. + L hemineglect, eyes can now pass midline. + L shoulder weakness--no change Spasticity:  none  Assessment/Plan: 1. Functional deficits which require 3+ hours per day of interdisciplinary therapy in a comprehensive inpatient rehab setting. Physiatrist is providing close team supervision and 24 hour management of active medical problems listed below. Physiatrist and rehab team continue to assess barriers to discharge/monitor patient progress toward functional and medical goals  Care Tool:  Bathing    Body parts bathed by patient: Face   Body parts bathed by helper: Left lower leg, Right lower leg, Left upper leg, Right upper leg, Buttocks, Front perineal area, Abdomen, Chest, Left arm, Right arm     Bathing assist Assist Level: Total Assistance - Patient < 25%     Upper Body Dressing/Undressing Upper body dressing   What is the patient wearing?: Pull over shirt    Upper body assist Assist Level: Total  Assistance - Patient < 25%    Lower Body Dressing/Undressing Lower body dressing      What is the patient wearing?: Pants, Incontinence brief     Lower body assist Assist for lower body dressing: Dependent - Patient 0%     Toileting Toileting    Toileting assist Assist for toileting: Dependent - Patient 0%     Transfers Chair/bed transfer  Transfers assist  Chair/bed transfer activity did not occur: Safety/medical concerns (unable 2/2 pain, fatigue, weakness)  Chair/bed transfer assist level: 2 Helpers     Locomotion Ambulation   Ambulation assist   Ambulation activity did not occur: Safety/medical concerns (unable 2/2 pain, fatigue, weakness)          Walk 10 feet activity   Assist  Walk 10 feet activity did not occur: Safety/medical concerns        Walk 50 feet activity   Assist Walk 50 feet with 2 turns activity did not occur: Safety/medical concerns         Walk 150 feet  activity   Assist Walk 150 feet activity did not occur: Safety/medical concerns         Walk 10 feet on uneven surface  activity   Assist Walk 10 feet on uneven surfaces activity did not occur: Safety/medical concerns         Wheelchair     Assist Is the patient using a wheelchair?: Yes Type of Wheelchair: Manual Wheelchair activity did not occur: Safety/medical concerns (unable 2/2 pain, fatigue, weakness)    Max wheelchair distance: 0    Wheelchair 50 feet with 2 turns activity    Assist    Wheelchair 50 feet with 2 turns activity did not occur: Safety/medical concerns       Wheelchair 150 feet activity     Assist  Wheelchair 150 feet activity did not occur: Safety/medical concerns       Blood pressure 128/78, pulse 76, temperature 98.6 F (37 C), resp. rate 16, height 5' 3"$  (1.6 m), weight 95.7 kg, SpO2 97 %.    Medical Problem List and Plan: 1. Functional deficits secondary to subdural hematoma and ICH             - patient may shower with PEG site covered - ELOS/Goals: 14-16 days, mod assist PT, mod assist OT, mod assist SLP  - goal DC 08/27/22              - Does well communicating with writing board and fingers for y/n             - Family requests change from DNR to full code on admission; changed   - Continue CIR therapies including PT, OT, and SLP  - 2/6 ordered L PRAFO, WHO, and soft cervical collar to help with head positioning OOB - 2/16: family meeting regarding Dispo plan, recommending full time skilled nursing care for significant deficits, family discussing [name] vs. Private care  2.  Antithrombotics: -DVT/anticoagulation:  Mechanical:  Antiembolism stockings, knee (TED hose) Bilateral lower extremities; add SCDs             -antiplatelet therapy: none - Duplex LUE - 2/8 superficial vein thrombosis involving the left cephalic vein-- warm compresses and conservative treatment   3. Pain Management: Tylenol and Robaxin 561m q6h as  needed  - improved   - 2/6: lidocaine patch to L shoulder for pain - benefitting    - 2/9: voltaren gel 2g to L shoulder, b/l knees  -  2/15: schedule Tylenol 650 mg Q8H, gabapentin 300 mg TID Prn   - 2/16: schedule gabapentin 100 mg TID - improved  4. Mood/Behavior/Sleep: LCSW to evaluate and provide emotional support             -antipsychotic agents: n/a  -Trazodone PRN   5. Neuropsych/cognition: This patient is not capable of making decisions on her own behalf.   6. Skin/Wound Care: Routine skin care checks - coccyx PI with scab  - routine care - LAL mattress - Dressings under PEG tube site to keep area dry, as appears moisture trapping under button 2/9 - improved   7. Fluids/Electrolytes/Nutrition: Routine Is and Os and follow-up chemistries             -NPO; has PEG             -continue Glucerna 1.5 continuous Tfs  - 2/9: Add potasium supplementation 20 meq BID; 2/11 dced - 2/12: Add 40 meq daily for K 3.4 ;Repeat in 2-3 days--not done, f/up on next labs 08/18/22 - stable 3.7  - Protein calorie malnutrition - on Juven BID, vitamins - Consult dietary to follow for titration - BG mildly elevated but in good range given age, monitor - 2/14: per dietary, cannot transition to bolus feeds d/t J tube. Looking into reducing hours of TF in daytime for ease of admin in home setting.  - 2/15: confirmed G tube with no J component; switching to bolus feeds. Will need to switch BG and insulin to [facility] and HS.   - 08/16/22: well controlled, changed CBG checks and SSI to TID AC+QHS  -08/17/22 CBGs looking great, cont to monitor  2/19: elevated BG yesterday; monitor 1+ day for adjustments   Recent Labs    08/17/22 1641 08/17/22 2115 08/18/22 0613  GLUCAP 215* 138* 174*       8: Hypertension: monitor TID and prn             -continue amlodipine 10 mg daily             -losartan 50 mg daily -> Dced 2/3 d/t concern for angioedema               - Add hydralazine 10 mg q8h PRN for SBP >180,  DBP>100 -2/4: BP stable/high; mild tachycardia this AM; if AM labs do not show dehydration, would start HCTZ 12.5 mg daily for adjunctive control -2/5 BP with improvement over last 12 hours--will hold off on adding any further medication right now.  -2/6 - mild HTN, no med changes, labs in AM  -2/7 -   HTN remains mild; start hctz 6.325 mg per tube daily; labs in AM 2/9  -2/9: DC HCTZ, switch to Coreg 3.125 BID, DC K supplement  -2/12: Monitor, if consistently HTN can increase coreg  -2/13: increase to 6.5 mg BID  - 08/17/22 BP/HR well controlled, cont regimen (amlodipine 58m QD, Coreg 6.232mBID, Hydralazine 1090m8h PRN) Vitals:   08/14/22 1425 08/14/22 1951 08/15/22 0450 08/15/22 0840  BP: 137/82 136/85 (!) 134/90 (!) 149/85   08/15/22 0843 08/15/22 1553 08/15/22 1931 08/16/22 0402  BP: (!) 149/85 110/76 (!) 152/76 135/65   08/16/22 1924 08/17/22 0437 08/17/22 1948 08/18/22 0309  BP: (!) 141/64 126/73 136/86 128/78     9: Hyperlipidemia: continue Lipitor 58m48m, Zetia 10mg10m  10: SDH/ICH s/p crani: Finished Keppra for sz ppx.    11: Leukocytosis: current temperature 99.3, ceftazidime completed 1/29 - resolved -Follow-up CBC with  differential, mild WBC increase 2.2 likely d/t POD#1; downtrending 2/3 - Fever overnight prior to admission; POD from PEG, monitor for recurrence  - No fever recurrence 2/3; monitor - 2/6: Labs in AM. CXR today for course lung sounds, and duplex for LUE swelling. Strict oral care QID    - 2/7: AM labs with mild subsegmental atelectasis on R; encourage ISB  -2/10 WBC 7.0 on 2/7, no signs of infection  -08/11/22 WBC 6.4, cont to monitor on weekly labs, next 08/18/22 - WNL  12: Anemia, multifactorial:  Stable HgB 8-9             -Follow-up CBC -hgb with sl decrease again today, normocytic  -check stool for OB  -PA added b-supp. Add Fe++ too  - 2/9: transfuse 1 unit for HgB 6.4 today. FOBT pending.   -2/10 FOBT pos, recheck, recheck H&H tomorrow, consider  GI consult -2/11 H/H stable, spoke with GI, no intervention at this time supportive care, GI ordered BMP,  re consult if HGB continues to decrease, appreciate assistance   -2/12: HgB stable, monitor on weekly labs next 08/18/22   13. ?Angioedema vs. Chlorhexadine reaction - improving, appears no dependent - Improving per family after DC chlorhexidine skin wipes and oral mouthwash              - Per SLP notes, interferes with tongue movement, speaking - Hx cough with Enalipril, Losartan increased from OP 25 mg to 50 mg 1/14, never weaned; edema started 1/30 per chart review - d/w pharmacy 2/4 and agree that reaction can occur at any time, and may cross-react with ARB. Will attempt to titrate BP meds without ACE/ARBs and monitor swelling.   - reposition head in bed to avoid dependent edema  14. L hemineglect - causing R gaze preference and positional tone             - Repositioning in bed to straighten head--head more neutral today 2/5 - May need to consider botox given need to recover cognitive function and relatively little tone elsewhere  - 2/6: E stim and taping to L shoulder for mild sublux per OT.   15. Urinary incontinence - ongoing  - Foley removed 2/6  - PVRs QID and ISC for >350 ccs retention - 2/7: 1x PVR low; monitor; ? Incontinence vs mixed, will consider timed toiletting after more information gathered  - 2/12: Start timed toiletting Q4H for urgency  16. Dysphagia, s/p PEG placement.   - Remains NPO  - Strict oral care QID  - CXR  given course breath sounds, weak cough, poor secretion clearance - R subsegmental atelectasis, encourage ISB  17. Diarrhea - stable/improving - loose stools likely d/t tube feeds; on fibercon; will add immodium for >2 diarrhea BM in 24 hours   - 2/15: daily Bms now, improved  -08/16/22 BM x2 today, cont to monitor.   -08/17/22 1 BM this morning during visit, but overall improving, monitor  18. Bibasilar crackles. CXR IMPRESSION: 1. Mildly  decreased lung volumes, although improved from most recent 08/05/2022 radiograph. 2. Right basilar subsegmental atelectasis. No definite focal airspace opacity to indicate pneumonia.  - ISB as tolerated; no other interventions at this time    LOS: 16 days A FACE TO [place] 08/18/2022, 9:08 AM

## 2022-08-18 NOTE — Progress Notes (Signed)
Occupational Therapy Session Note  Patient Details  Name: Gina Kelley MRN: [id number] Date of Birth: 1936/02/13  Today's Date: 08/18/2022 OT Individual Time: 1345-1445 OT Individual Time Calculation (min): 60 min    Short Term Goals: Week 2:  OT Short Term Goal 1 (Week 2): Pt will increase bed mobility while transitioning to seated on EOB from supine with Max A. OT Short Term Goal 2 (Week 2): Pt will increase attention to her immediate environment by locating 2 items needed for grooming task when placed to her left. OT Short Term Goal 3 (Week 2): Patient will complete functional transfer with LRAD and Max A of 1 person  Skilled Therapeutic Interventions/Progress Updates:    Pt received in TIS wc ready for therapy.  Pt taken to gym to focus on LUE NMR.  L arm placed on table top on towel and pt's arm guided through simple PROM exercises.  Pt was able to elicits some gravity eliminated movements of pushing arm forward, and pulling arm inward.  +2 A arrived, so set pt up to do slide board transfers to mat.  Upon starting transfer, noticed pt's bottom and w/c cover soaked with urine. Pt returned to room. SB of max A of 2 back to bed.  Total A with rolling several times to doff wet clothing, cleanse and don new briefs and pants.  Had pt help with cleansing her perineal area, but did need further A.    Pt resting in bed with all needs met and alarm set.   Therapy Documentation Precautions:  Precautions Precautions: Fall Precaution Comments: peg tube. HOB no lower than 30*. Left dense hemi. right gaze preference, left side inattention Required Braces or Orthoses: Other Brace Other Brace: PRAFO boots on in bed Restrictions Weight Bearing Restrictions: No Therapy Vitals Temp: 98.5 F (36.9 C) Temp Source: Oral Pulse Rate: 77 Resp: 17 BP: (Abnormal) 144/74 Patient Position (if appropriate): Sitting Oxygen Therapy SpO2: 98 % O2 Device: Room Air Pain:  No c/o pain   ADL: ADL Eating: NPO Grooming: Dependent Where Assessed-Grooming: Wheelchair Upper Body Bathing: Dependent Where Assessed-Upper Body Bathing: Sitting at sink, Wheelchair, Bed level Lower Body Bathing: Dependent Where Assessed-Lower Body Bathing: Bed level Upper Body Dressing: Dependent Where Assessed-Upper Body Dressing: Wheelchair, Sitting at sink, Bed level Lower Body Dressing: Dependent Where Assessed-Lower Body Dressing: Bed level Toileting: Dependent Where Assessed-Toileting: Bed level Toilet Transfer: Dependent Toilet Transfer Method: Squat pivot Tub/Shower Transfer: Not assessed Social research officer, government: Not assessed   Therapy/Group: Individual Therapy  [place] 08/18/2022, 4:31 PM

## 2022-08-18 NOTE — NC FL2 (Signed)
[place] LEVEL OF CARE FORM     IDENTIFICATION  Patient Name: Gina Kelley Birthdate: [birth year] Sex: female Admission Date (Current Location): 08/02/2022  [facility] and [place] Number:  Herbalist and Address:  The . [facility] [place] [address] [place], [place] [address]      Provider Number: [id number]  Attending Physician Name and Address:  Gertie Gowda, DO  Relative Name and Phone Number:  Molinda Bailiff (daughter) [phone]    Current Level of Care: Hospital Recommended Level of Care: [place] Prior Approval Number:    Date Approved/Denied:   PASRR Number: [id number]  Discharge Plan: [name]    Current Diagnoses: Patient Active Problem List   Diagnosis Date Noted   Mild malnutrition ([place]) 08/07/2022   Subdural hematoma ([place]) 08/02/2022   Acute respiratory failure with hypoxia ([place]) 07/22/2022   DNR (do not resuscitate) 07/22/2022   Pressure injury of skin 07/22/2022   Ventilator dependence ([place]) 07/11/2022   Malnutrition of moderate degree 07/10/2022   S/P craniotomy 07/08/2022   ICH (intracerebral hemorrhage) ([place]) 07/08/2022   Need for prophylactic vaccination against Streptococcus pneumoniae (pneumococcus) 11/15/2021   Prolapse of female pelvic organs 05/15/2020   Osteopenia of right femoral neck 10/20/2016   Hypertensive retinopathy of both eyes, grade 1 05/15/2016   Gastroesophageal reflux disease without esophagitis 02/18/2013   Healthcare maintenance 12/04/2011   Anemia in chronic renal disease 07/08/2006   Type 2 diabetes mellitus with stage 1 chronic kidney disease, without long-term current use of insulin ([place]) 04/14/2006   Hyperlipidemia 04/14/2006   Essential hypertension 04/14/2006    Orientation RESPIRATION BLADDER Height & Weight     Self, Situation  Normal Incontinent Weight: 211 lb (95.7 kg) Height:  5' 3"$  (160 cm)  BEHAVIORAL SYMPTOMS/MOOD NEUROLOGICAL BOWEL NUTRITION  STATUS      Incontinent Feeding tube (PEG)  AMBULATORY STATUS COMMUNICATION OF NEEDS Skin   Extensive Assist Verbally Other (Comment) (stage 2 to buttocks)                       Personal Care Assistance Level of Assistance  Feeding, Dressing, Bathing Bathing Assistance: Maximum assistance Feeding assistance: Maximum assistance Dressing Assistance: Maximum assistance     Functional Limitations Info  Sight, Hearing, Speech Sight Info: Adequate Hearing Info: Adequate Speech Info: Impaired    SPECIAL CARE FACTORS FREQUENCY  PT (By licensed PT), OT (By licensed OT), Speech therapy     PT Frequency: 5xs per week OT Frequency: 5xs per week     Speech Therapy Frequency: 5xs per week      Contractures Contractures Info: Not present    Additional Factors Info  Code Status, Allergies Code Status Info: Full Allergies Info: See discharge instructions           Current Medications (08/18/2022):  This is the current hospital active medication list Current Facility-Administered Medications  Medication Dose Route Frequency Provider Last Rate Last Admin   acetaminophen (TYLENOL) 160 MG/5ML solution 650 mg  650 mg Per Tube Q8H Engler, Morgan C, DO   650 mg at 08/18/22 1354   alum & mag hydroxide-simeth (MAALOX/MYLANTA) 200-200-20 MG/5ML suspension 30 mL  30 mL Per Tube Q4H PRN Setzer, Edman Circle, PA-C       amLODipine (NORVASC) tablet 10 mg  10 mg Per Tube Daily Barbie Banner, PA-C   10 mg at 08/18/22 1003   ascorbic acid (VITAMIN C) tablet 500 mg  500 mg  Per Tube Daily Durel Salts C, DO   500 mg at 08/18/22 1003   atorvastatin (LIPITOR) tablet 80 mg  80 mg Per Tube Daily Barbie Banner, PA-C   80 mg at 08/18/22 1003   carvedilol (COREG) tablet 6.25 mg  6.25 mg Per Tube BID WC Durel Salts C, DO   6.25 mg at 08/18/22 1003   diclofenac Sodium (VOLTAREN) 1 % topical gel 2 g  2 g Topical QID Durel Salts C, DO   2 g at 08/18/22 1355   diphenhydrAMINE (BENADRYL) 12.5 MG/5ML  elixir 12.5 mg  12.5 mg Per Tube Q8H PRN Barbie Banner, PA-C   25 mg at 08/08/22 1323   ezetimibe (ZETIA) tablet 10 mg  10 mg Per Tube Daily Barbie Banner, PA-C   10 mg at 08/18/22 1003   famotidine (PEPCID) tablet 20 mg  20 mg Per Tube BID Meredith Staggers, MD   20 mg at 08/18/22 1003   feeding supplement (GLUCERNA 1.5 CAL) liquid 237 mL  237 mL Per Tube 5 X Daily Durel Salts C, DO   237 mL at 08/18/22 1355   ferrous sulfate 300 (60 Fe) MG/5ML syrup 300 mg  300 mg Per Tube Q breakfast Alger Simons T, MD   300 mg at 08/18/22 1003   free water 120 mL  120 mL Per Tube 5 X Daily Durel Salts C, DO   120 mL at 08/18/22 1355   gabapentin (NEURONTIN) 250 MG/5ML solution 100 mg  100 mg Per Tube Q8H Engler, Morgan C, DO   100 mg at 08/18/22 1450   guaiFENesin-dextromethorphan (ROBITUSSIN DM) 100-10 MG/5ML syrup 5-10 mL  5-10 mL Per Tube Q6H PRN Barbie Banner, PA-C       hydrALAZINE (APRESOLINE) tablet 10 mg  10 mg Per Tube Q8H PRN Durel Salts C, DO       insulin aspart (novoLOG) injection 0-15 Units  0-15 Units Subcutaneous TID WC Street, [place], [place]   3 Units at 08/18/22 1354   insulin aspart (novoLOG) injection 0-5 Units  0-5 Units Subcutaneous QHS Street, [place], PA-C       l-methylfolate-B6-B12 [facility]) 3-35-2 MG per tablet 1 tablet  1 tablet Per Tube Daily Barbie Banner, PA-C   1 tablet at 08/18/22 1003   lidocaine (LIDODERM) 5 % 1 patch  1 patch Transdermal Q24H Barbie Banner, PA-C   1 patch at 08/18/22 1004   loperamide HCl (IMODIUM) 1 MG/7.5ML suspension 2 mg  2 mg Per Tube PRN Donnamae Jude, RPH       methocarbamol (ROBAXIN) tablet 500 mg  500 mg Per Tube Q6H PRN Setzer, Edman Circle, PA-C       nutrition supplement (JUVEN) (JUVEN) powder packet 1 packet  1 packet Per Tube BID BM Durel Salts C, DO   1 packet at 08/18/22 1354   polycarbophil (FIBERCON) tablet 625 mg  625 mg Per Tube Daily Meredith Staggers, MD   625 mg at 08/18/22 1003   polyethylene glycol (MIRALAX /  GLYCOLAX) packet 17 g  17 g Per Tube Daily PRN Setzer, Edman Circle, PA-C       polyvinyl alcohol (LIQUIFILM TEARS) 1.4 % ophthalmic solution 1 drop  1 drop Both Eyes PRN Barbie Banner, PA-C   1 drop at 08/18/22 1002   prochlorperazine (COMPAZINE) tablet 5-10 mg  5-10 mg Per Tube Q6H PRN Barbie Banner, PA-C       Or   prochlorperazine (COMPAZINE) injection  5-10 mg  5-10 mg Intramuscular Q6H PRN Barbie Banner, PA-C       Or   prochlorperazine (COMPAZINE) suppository 12.5 mg  12.5 mg Rectal Q6H PRN Setzer, Edman Circle, PA-C       sodium phosphate (FLEET) 7-19 GM/118ML enema 1 enema  1 enema Rectal Once PRN Setzer, Edman Circle, PA-C       sorbitol 70 % solution 30 mL  30 mL Per Tube Daily PRN Setzer, Edman Circle, PA-C       traZODone (DESYREL) tablet 25-50 mg  25-50 mg Per Tube QHS PRN Setzer, Edman Circle, PA-C       white petrolatum (VASELINE) gel   Topical PRN Barbie Banner, PA-C   0.2 Application at [id number]     Discharge Medications: Please see discharge summary for a list of discharge medications.  Relevant Imaging Results:  Relevant Lab Results:   Additional Information [id number]  Rana Snare, LCSW

## 2022-08-19 LAB — GLUCOSE, CAPILLARY
Glucose-Capillary: 150 mg/dL — ABNORMAL HIGH (ref 70–99)
Glucose-Capillary: 182 mg/dL — ABNORMAL HIGH (ref 70–99)
Glucose-Capillary: 204 mg/dL — ABNORMAL HIGH (ref 70–99)
Glucose-Capillary: 170 mg/dL — ABNORMAL HIGH (ref 70–99)

## 2022-08-19 MED ORDER — FLUOXETINE HCL 20 MG PO CAPS
20.0000 mg | ORAL_CAPSULE | Freq: Every day | ORAL | Status: DC
  Administered 2022-08-19 – 2022-08-21 (×3): 20 mg
  Filled 2022-08-19 (×3): qty 1

## 2022-08-19 MED ORDER — METFORMIN HCL 500 MG PO TABS
500.0000 mg | ORAL_TABLET | Freq: Every day | ORAL | Status: DC
  Administered 2022-08-20 – 2022-08-21 (×2): 500 mg
  Filled 2022-08-19 (×2): qty 1

## 2022-08-19 MED ORDER — METFORMIN HCL 500 MG PO TABS: 500.0000 mg | ORAL_TABLET | Freq: Every day | ORAL | Status: DC

## 2022-08-19 NOTE — Progress Notes (Signed)
Patient ID: Gina Kelley, female   DOB: 1936/01/01, 87 y.o.   MRN: [id number]  SW returned phone call to pt dr Kalman Shan. Discussed preferred [name]- [facility]. SW informed will follow-up with facility, and will provide updates as available. SW provided updates from team conference as well.   1530-SW left message for liaison Gina Kelley/[facility] to discuss bed offer, and waiting on follow-up.   Loralee Pacas, MSW, [place] Office: [phone] Cell: [phone] Fax: [phone]

## 2022-08-19 NOTE — Progress Notes (Signed)
Occupational Therapy Weekly Progress Note  Patient Details  Name: Gina Kelley MRN: [id number] Date of Birth: 08/04/1935  Beginning of progress report period: August 03, 2022 End of progress report period: August 19, 2022  Today's Date: 08/19/2022 OT Individual Time: 1300-1420 OT Individual Time Calculation (min): 80 min    Patient has met 1 of 3 short term goals.  Patient is making slow progress towards OT goals. She has made minimal functional progress this week. She still requires max of 1 and 2 person assist for squat-pivot and slideboard transfers. L UE is still very flaccid with subluxation. I have achieved trace shoulder and scapular activation in gravity eliminated position, but this has not translated into any functional movement. L attention has improved slightly, but not enough to make functional impact. Total A for LB ADLs and toileting, mod/max A for UB ADLS.   Patient continues to demonstrate the following deficits: muscle weakness, decreased cardiorespiratoy endurance, impaired timing and sequencing, abnormal tone, unbalanced muscle activation, motor apraxia, ataxia, decreased coordination, and decreased motor planning, decreased midline orientation, decreased attention to left, left side neglect, and decreased motor planning, decreased initiation, decreased attention, decreased awareness, decreased problem solving, decreased safety awareness, decreased memory, and delayed processing, and decreased sitting balance, decreased standing balance, decreased postural control, hemiplegia, and decreased balance strategies and therefore will continue to benefit from skilled OT intervention to enhance overall performance with BADL and Reduce care partner burden.  Patient not progressing toward long term goals.  See goal revision..  Plan of care revisions: Goals downgraded to max A of 1.  OT Short Term Goals Week 2:  OT Short Term Goal 1 (Week 2): Pt will increase bed mobility while  transitioning to seated on EOB from supine with Max A. OT Short Term Goal 1 - Progress (Week 2): Progressing toward goal OT Short Term Goal 2 (Week 2): Pt will increase attention to her immediate environment by locating 2 items needed for grooming task when placed to her left. OT Short Term Goal 2 - Progress (Week 2): Met OT Short Term Goal 3 (Week 2): Patient will complete functional transfer with LRAD and Max A of 1 person OT Short Term Goal 3 - Progress (Week 2): Progressing toward goal Week 3:  OT Short Term Goal 1 (Week 3): LTG=stg 2/2 ELOS  Skilled Therapeutic Interventions/Progress Updates:    Pt greeted sitting in TIS wc with family visiting. OT removed kinesiotape and tubigrip sleeve for skin check. Skin dry and intact. Pt's sweeling is better, but pt still with a lot of swelling distally at elbow/wrist/hand. OT provided gentle stretching and massage for ROM and edema management. Utilized Stedy for +2 sit<>stand. Pt unable to achieve enough hip and trunk extension to get Stedy seat down. Kept using Stedy for sit<>stands to provide total A for peri-care and brief change as pt was SOAKED through her pants and in chair. It took 2 partial sit<>stands to get her cleaned up. Pt could initiate the stand, but needed max +2 to maintain partial stand. OT played pt preferred gospel music. Sit<>stand in [place] with +2, then utilized praise and worship by lifting R UE overhead to facilitate hip and trunk extension and decrease pushing. Pt tolerated this 3x with max+2 and tolerated standing for 145 second intervals.  OT re-applied kinesiotape to wrist/hand and forearm, as well as shoulder to support subluxation., Tubigrip sleeve re-applied. Pt then completed Max+2 slideboard back to bed.. Pt left semi-reclined in bed with needs met.   Therapy  Documentation Precautions:  Precautions Precautions: Fall Precaution Comments: peg tube. HOB no lower than 30*. Left dense hemi. right gaze preference, left side  inattention Required Braces or Orthoses: Other Brace Other Brace: PRAFO boots on in bed Restrictions Weight Bearing Restrictions: No General: General OT Amount of Missed Time: 55 Minutes Vital Signs: Therapy Vitals Temp: 98.6 F (37 C) Pulse Rate: 75 Resp: 16 BP: 124/77 Patient Position (if appropriate): Sitting Oxygen Therapy SpO2: 100 % O2 Device: Room Air Pain: Denies pain   Therapy/Group: Individual Therapy  Valma Cava 08/19/2022, 3:40 PM

## 2022-08-19 NOTE — Patient Care Conference (Signed)
Inpatient RehabilitationTeam Conference and Plan of Care Update Date: 08/19/2022   Time: 10:31 AM    Patient Name: Gina Kelley Record Number: [id number]  Date of Birth: [birth year] Sex: Female         Room/Bed: 4W23C/4W23C-01 Payor Info: Payor: HU[employer] MEDICARE / Plan: HU[employer] MEDICARE HMO / Product Type: *No Product type* /    Admit Date/Time:  08/02/2022  1:50 PM  Primary Diagnosis:  Subdural hematoma [facility])  Hospital Problems: Principal Problem:   Subdural hematoma ([place]) Active Problems:   Mild malnutrition [facility])    Expected Discharge Date: Expected Discharge Date: 08/27/22  Team Members Present: Physician leading conference: Dr. Durel Salts Social Worker Present: Loralee Pacas, [place] Nurse Present: Tacy Learn, RN PT Present: Terence Lux, PT OT Present: Cherylynn Ridges, OT SLP Present: Weston Anna, SLP PPS Coordinator present : Gunnar Fusi, SLP     Current Status/Progress Goal Weekly Team Focus  Bowel/Bladder   Incontinent of b/b, time toileting 2-4hrs   Regain continence   Toilet every 2-4 hrs    Swallow/Nutrition/ Hydration   NPO with PEG, trials with SLP with overall Max A multimodal cues      MBS this week to assess swallow function    ADL's   Max A +2, total A for LB self-care, max A UB   Mod A overall, downgrading this week to mod/max A   L UR NME, NMES, L attention, self-care retraining, dc planning, pt/family education    Mobility   Patient has made no progress since last week and continues to require Mod/MaxA for bed mobility, MaxA for transfers via squat pivot, SBA/MinA for dynamic and static sitting balance with L lateral and posterior lean secondary to pushing   Goals downgraded to [place] with several goals discharged (gait, sit/stand and stair mobility)  L LE NMR, midline posturing, dynamic sitting balance, bed mobility, transfers, L visual field/tracking, sit/stands, discharge planning    Communication   Max A for  speech intelligibility and multimodal communication   Mod A   use of speech intelligibility strategies, functional communication    Safety/Cognition/ Behavioral Observations  Max A   Mod A   sustained attentin, visual scanning, orientation    Pain   pt states that she is not in pain   Remain pain free   Assess qshift and prn    Skin   Peg tube to abdomen, healing stage 2 on buttocks.   Promote healing  Assess qshift and prn      Discharge Planning:  D/c remains pending at this time as family is working towards establishing 24/7 care. Fam mtg on Friday (2/9) to give over view of care needs. family intends to apply for Medicaid and LTC Medicaid. Family has [name] list based on insurance. Family does not appear to have a discharge plan. SW will confirm there are no barriers to discharge.   Team Discussion: SDH. Incontinent B/B with time voids. Tylenol scheduled. Incision to head is healing. B/P improving. Hbg stable. Facial edema- will have pharmacy evaluate medications for indicators. Bilateral AFO. ABD binder. Chest xray negative. Daily weight. NPO PEG tube. Therapies downgrading goals to Mod/MaxA. Patient on target to meet rehab goals: no, MaxA+2, totalA for LB self care, Max A UB. MaxA with sustained attention, visual scanning and orientation. MaxA for speech intelligibility and use of strategies.   *See Care Plan and progress notes for long and short-term goals.   Revisions to Treatment Plan:  Pharmacy to  review medications for cause of facial edema. Monitor labs. Medication adjustments.   Teaching Needs: Medications, safety, self care, PEG care, transfer training, etc.   Current Barriers to Discharge: Decreased caregiver support, Incontinence, Lack of/limited family support, Insurance for [name] coverage, Weight, and Nutritional means  Possible Resolutions to Barriers: Discharge plan, time voiding, [name] vs 24/7 caregivers, optimal nutrition, nursing education, family ed completed,  order recommended DME     Medical Summary Current Status: medically complicated by cognitive deficits, facial edema, spasticity, pain control, dysphagia, secretion management, electrolyte abnormalities, hypertension, and bowel/bladder incontinence  Barriers to Discharge: Behavior/Mood;Complicated Wound;Electrolyte abnormality;Inadequate Nutritional Intake;Incontinence;Medical stability;Self-care education;Spasticity;Symptomatic Anemia;Uncontrolled Hypertension;Uncontrolled Pain  Barriers to Discharge Comments: cognitive deficits, hemiplegia, spasticity, dysphagia s/p PEG, poor pain control, hypertension, incontinence Possible Resolutions to [employer] Focus: medication titration for HTN, timed toiletting, stretching/taping for tone   Continued Need for Acute Rehabilitation Level of Care: The patient requires daily medical management by a physician with specialized training in physical medicine and rehabilitation for the following reasons: Direction of a multidisciplinary physical rehabilitation program to maximize functional independence : Yes Medical management of patient stability for increased activity during participation in an intensive rehabilitation regime.: Yes Analysis of laboratory values and/or radiology reports with any subsequent need for medication adjustment and/or medical intervention. : Yes   I attest that I was present, lead the team conference, and concur with the assessment and plan of the team.   Ernest Pine 08/19/2022, 3:01 PM

## 2022-08-19 NOTE — Progress Notes (Signed)
PROGRESS NOTE   Subjective/Complaints:  Pt without any complaints this AM. Tired, per therapies energy did seem improved today but overall more depressed/down.   ROS: limited due to language/communication +L shoulder pain - ongoing, improving; PEG and knee pain improved  Objective:   DG Chest 2 View  Result Date: 08/18/2022 CLINICAL DATA:  Bilateral rales. EXAM: CHEST - 2 VIEW COMPARISON:  Chest radiographs 08/05/2022, on [id number], 07/13/2022 03/20/2023 FINDINGS: Cardiac silhouette and mediastinal contours are within normal limits. Mild to moderate calcification within the aortic arch. Mildly decreased lung volumes, although improved from most recent 08/05/2022 radiograph. Right basilar linear subsegmental atelectasis is similar to 08/05/2022. No definitive focal airspace opacity to indicate pneumonia. No pleural effusion or pneumothorax. Moderate multilevel degenerative disc changes of the thoracic spine. IMPRESSION: 1. Mildly decreased lung volumes, although improved from most recent 08/05/2022 radiograph. 2. Right basilar subsegmental atelectasis. No definite focal airspace opacity to indicate pneumonia. Electronically Signed   By: Yvonne Kendall M.D.   On: 08/18/2022 09:55   Recent Labs    08/18/22 0555  WBC 6.4  HGB 8.4*  HCT 27.3*  PLT 284     Recent Labs    08/18/22 0555  NA 140  K 3.7  CL 100  CO2 30  GLUCOSE 165*  BUN 37*  CREATININE 0.58  CALCIUM 8.5*     No intake or output data in the 24 hours ending 08/19/22 1033         Physical Exam: Vital Signs Blood pressure 139/65, pulse 78, temperature 98 F (36.7 C), resp. rate 18, height 5' 3"$  (1.6 m), weight 95.5 kg, SpO2 97 %.  Constitutional: No distress . Vital signs reviewed. Appears comfortable in bed HEENT: NCAT, lip swelling, particular lower - R>L, appears dependent - stable   Neck: supple, forward flexed. Has inflatable neck pillow which was  readjusted.   Cardiovascular: RRR without murmur. No JVD    Respiratory/Chest: Bibasilar crackles, fine, ongoing with bowel sounds overlying. No RRW, Normal effort.  GI/Abdomen: Normal active bowel sounds, soft, nontender +PEG, c/d/i Ext: no clubbing, cyanosis. LUE 1+ Edema - stable Psych: pleasant and cooperative  Skin: C/D/I. No apparent lesions.    Prior exam: + Sacral wound  - scabbed - improved  MSK:      1/2 fingerbreadth subluxation L shoulder - stable      Strength:                 LUE: Flaccid , 0/5 throughout                LLE:  antigravity HF, KE   Neurologic exam:  Cognition: AAO to person, place, and time. Follows simple commands, reacts to verbal input. Lethargic this AM.    Insight: Fair insight into current condition. Knows she had a stroke.   + LUE Hoffman's; +L facial droop. + L hemineglect, eyes can now pass midline. + L shoulder weakness--no change Spasticity:  none  Assessment/Plan: 1. Functional deficits which require 3+ hours per day of interdisciplinary therapy in a comprehensive inpatient rehab setting. Physiatrist is providing close team supervision and 24 hour management of active medical problems listed below. Physiatrist and rehab  team continue to assess barriers to discharge/monitor patient progress toward functional and medical goals  Care Tool:  Bathing    Body parts bathed by patient: Face   Body parts bathed by helper: Left lower leg, Right lower leg, Left upper leg, Right upper leg, Buttocks, Front perineal area, Abdomen, Chest, Left arm, Right arm     Bathing assist Assist Level: Total Assistance - Patient < 25%     Upper Body Dressing/Undressing Upper body dressing   What is the patient wearing?: Pull over shirt    Upper body assist Assist Level: Total Assistance - Patient < 25%    Lower Body Dressing/Undressing Lower body dressing      What is the patient wearing?: Pants, Incontinence brief     Lower body assist Assist for  lower body dressing: Dependent - Patient 0%     Toileting Toileting    Toileting assist Assist for toileting: Dependent - Patient 0%     Transfers Chair/bed transfer  Transfers assist  Chair/bed transfer activity did not occur: Safety/medical concerns (unable 2/2 pain, fatigue, weakness)  Chair/bed transfer assist level: 2 Helpers     Locomotion Ambulation   Ambulation assist   Ambulation activity did not occur: Safety/medical concerns (unable 2/2 pain, fatigue, weakness)          Walk 10 feet activity   Assist  Walk 10 feet activity did not occur: Safety/medical concerns        Walk 50 feet activity   Assist Walk 50 feet with 2 turns activity did not occur: Safety/medical concerns         Walk 150 feet activity   Assist Walk 150 feet activity did not occur: Safety/medical concerns         Walk 10 feet on uneven surface  activity   Assist Walk 10 feet on uneven surfaces activity did not occur: Safety/medical concerns         Wheelchair     Assist Is the patient using a wheelchair?: Yes Type of Wheelchair: Manual Wheelchair activity did not occur: Safety/medical concerns (unable 2/2 pain, fatigue, weakness)    Max wheelchair distance: 0    Wheelchair 50 feet with 2 turns activity    Assist    Wheelchair 50 feet with 2 turns activity did not occur: Safety/medical concerns       Wheelchair 150 feet activity     Assist  Wheelchair 150 feet activity did not occur: Safety/medical concerns       Blood pressure 139/65, pulse 78, temperature 98 F (36.7 C), resp. rate 18, height 5' 3"$  (1.6 m), weight 95.5 kg, SpO2 97 %.    Medical Problem List and Plan: 1. Functional deficits secondary to subdural hematoma and ICH             - patient may shower with PEG site covered - ELOS/Goals: goal DC 08/27/22 , downgraded to Mod-Max Ax1 PT/OT/SLP             - Does well communicating with writing board and fingers for y/n              - Family requests change from DNR to full code on admission; changed   - Continue CIR therapies including PT, OT, and SLP  - 2/6 ordered L PRAFO, WHO, and soft cervical collar to help with head positioning OOB - 2/16: family meeting regarding Dispo plan, recommending full time skilled nursing care for significant deficits, family discussing [name] vs. Private care  2.  Antithrombotics: -DVT/anticoagulation:  Mechanical:  Antiembolism stockings, knee (TED hose) Bilateral lower extremities; add SCDs             -antiplatelet therapy: none - Duplex LUE - 2/8 superficial vein thrombosis involving the left cephalic vein-- warm compresses and conservative treatment   3. Pain Management: Tylenol and Robaxin 540m q6h as needed  - improved   - 2/6: lidocaine patch to L shoulder for pain - benefitting    - 2/9: voltaren gel 2g to L shoulder, b/l knees  - 2/15: schedule Tylenol 650 mg Q8H, gabapentin 300 mg TID Prn   - 2/16: schedule gabapentin 100 mg TID - improved  4. Mood/Behavior/Sleep: LCSW to evaluate and provide emotional support             -antipsychotic agents: n/a  -Trazodone PRN - 2/20: will start Fluoxetine 20 mg QHS for depressed mood and motor recovery   5. Neuropsych/cognition: This patient is not capable of making decisions on her own behalf.   6. Skin/Wound Care: Routine skin care checks - coccyx PI with scab  - routine care - LAL mattress - Dressings under PEG tube site to keep area dry, as appears moisture trapping under button 2/9 - improved   7. Fluids/Electrolytes/Nutrition: Routine Is and Os and follow-up chemistries             -NPO; has PEG             -continue Glucerna 1.5 continuous Tfs  - 2/9: Add potasium supplementation 20 meq BID; 2/11 dced - 2/12: Add 40 meq daily for K 3.4 ;Repeat in 2-3 days--not done, f/up on next labs 08/18/22 - stable 3.7  - Protein calorie malnutrition - on Juven BID, vitamins - Consult dietary to follow for titration - BG mildly  elevated but in good range given age, monitor - 2/14: per dietary, cannot transition to bolus feeds d/t J tube. Looking into reducing hours of TF in daytime for ease of admin in home setting.  - 2/15: confirmed G tube with no J component; switching to bolus feeds. [facility] need [facility]   - 08/16/22: well controlled, changed CBG checks and SSI to TID AC+QHS  -08/17/22 CBGs looking great, cont to monitor  2/19: elevated BG yesterday; monitor 1+ day for adjustments  2/20: MBS 2/21 scheduled; BG elevated, will start Metformin 500 mg QAM per tube   Recent Labs    08/18/22 1707 08/18/22 2059 08/19/22 0548  GLUCAP 237* 152* 150*       8: Hypertension: monitor TID and prn             -continue amlodipine 10 mg daily             -losartan 50 mg daily -> Dced 2/3 d/t concern for angioedema               - Add hydralazine 10 mg q8h PRN for SBP >180, DBP>100 -2/4: BP stable/high; mild tachycardia this AM; if AM labs do not show dehydration, would start HCTZ 12.5 mg daily for adjunctive control -2/5 BP with improvement over last 12 hours--will hold off on adding any further medication right now.  -2/6 - mild HTN, no med changes, labs in AM  -2/7 -   HTN remains mild; start hctz 6.325 mg per tube daily; labs in AM 2/9  -2/9: DC HCTZ, switch to Coreg 3.125 BID, DC K supplement  -2/12: Monitor, if consistently HTN can increase  coreg  -2/13: increase to 6.5 mg BID  - 08/17/22 BP/HR well controlled, cont regimen (amlodipine 47m QD, Coreg 6.284mBID, Hydralazine 1026m8h PRN) Vitals:   08/15/22 0843 08/15/22 1553 08/15/22 1931 08/16/22 0402  BP: (!) 149/85 110/76 (!) 152/76 135/65   08/16/22 1924 08/17/22 0437 08/17/22 1947 08/17/22 1948  BP: (!) 141/64 126/73 136/86 136/86   08/18/22 0309 08/18/22 1307 08/18/22 1917 08/19/22 0255  BP: 128/78 (!) 144/74 (!) 132/53 139/65     9: Hyperlipidemia: continue Lipitor 60m48m, Zetia 10mg67m  10: SDH/ICH s/p crani: Finished  Keppra for sz ppx.    11: Leukocytosis: current temperature 99.3, ceftazidime completed 1/29 - resolved -Follow-up CBC with differential, mild WBC increase 2.2 likely d/t POD#1; downtrending 2/3 - Fever overnight prior to admission; POD from PEG, monitor for recurrence  - No fever recurrence 2/3; monitor - 2/6: Labs in AM. CXR today for course lung sounds, and duplex for LUE swelling. Strict oral care QID    - 2/7: AM labs with mild subsegmental atelectasis on R; encourage ISB  -2/10 WBC 7.0 on 2/7, no signs of infection  -08/11/22 WBC 6.4, cont to monitor on weekly labs, next 08/18/22 - WNL  12: Anemia, multifactorial:  Stable HgB 8-9             -Follow-up CBC -hgb with sl decrease again today, normocytic  -check stool for OB  -PA added b-supp. Add Fe++ too  - 2/9: transfuse 1 unit for HgB 6.4 today. FOBT pending.   -2/10 FOBT pos, recheck, recheck H&H tomorrow, consider GI consult -2/11 H/H stable, spoke with GI, no intervention at this time supportive care, GI ordered BMP,  re consult if HGB continues to decrease, appreciate assistance   -2/12: HgB stable, monitor on weekly labs next 08/18/22   13. ?Angioedema vs. Chlorhexadine reaction - improving, appears no dependent - Improving per family after DC chlorhexidine skin wipes and oral mouthwash              - Per SLP notes, interferes with tongue movement, speaking - Hx cough with Enalipril, Losartan increased from OP 25 mg to 50 mg 1/14, never weaned; edema started 1/30 per chart review - d/w pharmacy 2/4 and agree that reaction can occur at any time, and may cross-react with ARB. Will attempt to titrate BP meds without ACE/ARBs and monitor swelling.   - reposition head in bed to avoid dependent edema  14. L hemineglect - causing R gaze preference and positional tone             - Repositioning in bed to straighten head--head more neutral today 2/5 - May need to consider botox given need to recover cognitive function and relatively  little tone elsewhere  - 2/6: E stim and taping to L shoulder for mild sublux per OT.   15. Urinary incontinence - ongoing  - Foley removed 2/6  - PVRs QID and ISC for >350 ccs retention - 2/7: 1x PVR low; monitor; ? Incontinence vs mixed, will consider timed toiletting after more information gathered  - 2/12: Start timed toiletting Q4H for urgency  16. Dysphagia, s/p PEG placement.   - Remains NPO  - Strict oral care QID  - CXR  given course breath sounds, weak cough, poor secretion clearance - R subsegmental atelectasis, encourage ISB - See above  17. Diarrhea - stable/improving - loose stools likely d/t tube feeds; on fibercon; will add immodium for >2 diarrhea BM in 24 hours   -  2/15: daily Bms now, improved  -08/16/22 BM x2 today, cont to monitor.   -08/17/22 1 BM this morning during visit, but overall improving, monitor  18. Bibasilar crackles. CXR IMPRESSION: 1. Mildly decreased lung volumes, although improved from most recent 08/05/2022 radiograph. 2. Right basilar subsegmental atelectasis. No definite focal airspace opacity to indicate pneumonia.  - ISB as tolerated; no other interventions at this time    LOS: 17 days A FACE TO [place] 08/19/2022, 10:33 AM

## 2022-08-19 NOTE — Progress Notes (Signed)
Physical Therapy Session Note  Patient Details  Name: Gina Kelley MRN: [id number] Date of Birth: [birth year]  Today's Date: 08/19/2022 PT Individual Time: 1st Treatment Session: [id number]; 2nd Treatment Session: 1100-1200 PT Individual Time Calculation (min): 30 min; 60 min  Short Term Goals: Week 3:  PT Short Term Goal 1 (Week 3): Patient will perform bed/chair transfer with LRAD and ModA PT Short Term Goal 2 (Week 3): Patient will maintain dynamic sitting balance with CGA PT Short Term Goal 3 (Week 3): Patient will initiate sit/stand with LRAD  Skilled Therapeutic Interventions/Progress Updates:  1st Treatment Session- Patient greeted supine in bed and agreeable to PT treatment session, however reporting feeling fatigued this morning. Patient reported her brief was clean, however upon further inspection patient was soaked through her brief, chuck pad and into the bed. Patient rolled R with Min/ModA and L with Max/TotalA in order for therapist to perform pericare, don clean brief and don pants. Patient transitioned from supine to sitting EOB with MaxA- Patient initially demonstrating significant posterior lean requiring total assist for shifting her weight anteriorly. Patient was then able to sit EOB with SBA for safety and improved midline posturing noted. Therapist doffed shirt and donned clean shirt with TotalA. Patient requesting to lie back down until she sees this therapist at 1100- Patient transitioned from sitting EOB to supine with MaxA. Once supine, therapist repositioned patient to L sidelying with pillows between her LE and supporting her head to promote L neck rotation. All of patient's needs met and call bell on chest.   2nd Treatment Session- Patient greeted supine in bed and agreeable to PT treatment session. Patient transitioned from supine to EOB with Mod/MaxA for righting trunk and slightly managing B LE. While sitting EOB patient initially demonstrated significantly  posterior lean, however with anterior weight shift she was able to maintain sitting balance with SBA and MAX VC. Patient performed squat pivot transfer from EOB to TIS wheelchair with Mod/MaxA- Patient noted to be pushing through B LE throughout transfer. Patient wheeled to rehab gym where patient worked on unsupported sitting balance on roho cushion with multimodal cues for improved R lateral weight shift as patient pushes to the L. Mirror placed in front of patient for external cues regarding posturing. Patient requested to work on standing and walking this treatment session. Patient stood at hemi-rail with R UE x5 trials with MaxA from therapist and rehab tech providing TC at hips for improved extension. During each stand, patient provided with multimodal cues and assist for forward gaze, improved hip and knee extension and decreased pushing with R UE. During the final two stands, patient was able to take small steps forward with R LE however demonstrated decreased R lateral weight shifting for L LE advancement. With facilitation for R lateral weight shift, patient demonstrated ataxia and poor motor planning when attempting to advance L LE requiring therapist to assist with advancement, placement and stability. Patient and therapist required a seated break in between each stand secondary to fatigue. Patient wheeled back to her room and left reclined in TIS wheelchair with L UE propped on a pillow, call bell within reach and all needs met. Upon exiting the room patient reported she needed to use the restroom- NT notified and aware.    Therapy Documentation Precautions:  Precautions Precautions: Fall Precaution Comments: peg tube. HOB no lower than 30*. Left dense hemi. right gaze preference, left side inattention Required Braces or Orthoses: Other Brace Other Brace: PRAFO boots on in bed Restrictions  Weight Bearing Restrictions: No  Therapy/Group: Individual Therapy  Zephyra Bernardi 08/19/2022, 7:41  AM

## 2022-08-19 NOTE — Plan of Care (Signed)
Problem: RH Balance Goal: LTG: Patient will maintain dynamic sitting balance (OT) Description: LTG:  Patient will maintain dynamic sitting balance with assistance during activities of daily living (OT) Outcome: Not Applicable   Problem: RH Balance Goal: LTG: Patient will maintain dynamic sitting balance (OT) Description: LTG:  Patient will maintain dynamic sitting balance with assistance during activities of daily living (OT) 08/19/2022 1548 by Cate Oravec, Daneen Schick, OT Reactivated 08/19/2022 1544 by Valma Cava, OT Outcome: Not Applicable   Problem: RH Grooming Goal: LTG Patient will perform grooming w/assist,cues/equip (OT) Description: LTG: Patient will perform grooming with assist, with/without cues using equipment (OT) 08/19/2022 1548 by Ekam Besson, Daneen Schick, [place] (Taken 08/19/2022 1548) LTG: Pt will perform grooming with assistance level of: Minimal Assistance - Patient > 75% Note: Goal downgraded 2/20 due to lack of progress-ESD 08/19/2022 1548 by Rockell Faulks, Daneen Schick, OT Reactivated 08/19/2022 1544 by Valma Cava, OT Outcome: Not Applicable Flowsheets (Taken 08/19/2022 1544) LTG: Pt will perform grooming with assistance level of: Minimal Assistance - Patient > 75% Note: Goal downgraded 2/20 due to lack of progress-EDD   Problem: RH Bathing Goal: LTG Patient will bathe all body parts with assist levels (OT) Description: LTG: Patient will bathe all body parts with assist levels (OT) 08/19/2022 1548 by Valma Cava, OT Flowsheets (Taken 08/19/2022 1548) LTG: Pt will perform bathing with assistance level/cueing: Maximal Assistance - Patient 25 - 49% Note: Goal downgraded 2/20 due to lack of progress-ESD 08/19/2022 1548 by Shadow Schedler, Daneen Schick, OT Reactivated 08/19/2022 1544 by Valma Cava, OT Outcome: Not Applicable Flowsheets (Taken 08/19/2022 1544) LTG: Pt will perform bathing with assistance level/cueing: Maximal Assistance - Patient 25 - 49% Note: Goal downgraded  2/20 due to lack of progress-EDD   Problem: RH Dressing Goal: LTG Patient will perform upper body dressing (OT) Description: LTG Patient will perform upper body dressing with assist, with/without cues (OT). 08/19/2022 1548 by Merridy Pascoe, Daneen Schick, OT Flowsheets (Taken 08/19/2022 1548) LTG: Pt will perform upper body dressing with assistance level of: Moderate Assistance - Patient 50 - 74% Note: Goal downgraded 2/20 due to lack of progress-ESD 08/19/2022 1548 by Luis Nickles, Daneen Schick, OT Reactivated 08/19/2022 1544 by Valma Cava, OT Outcome: Not Applicable Flowsheets (Taken 08/19/2022 1544) LTG: Pt will perform upper body dressing with assistance level of: Moderate Assistance - Patient 50 - 74% Note: Goal downgraded 2/20 due to lack of progress-EDD Goal: LTG Patient will perform lower body dressing w/assist (OT) Description: LTG: Patient will perform lower body dressing with assist, with/without cues in positioning using equipment (OT) 08/19/2022 1548 by Kateryn Marasigan, Daneen Schick, OT Reactivated 08/19/2022 1544 by Valma Cava, OT Outcome: Not Applicable Flowsheets (Taken 08/19/2022 1544) LTG: Pt will perform lower body dressing with assistance level of: (d/c goal 2/20-ESD) -- Note: Goal d/c 2/20 due to lack of progress-ESD   Problem: RH Toileting Goal: LTG Patient will perform toileting task (3/3 steps) with assistance level (OT) Description: LTG: Patient will perform toileting task (3/3 steps) with assistance level (OT)  08/19/2022 1548 by Dugan Vanhoesen, Daneen Schick, OT Outcome: Not Applicable Flowsheets (Taken 08/19/2022 1548) LTG: Pt will perform toileting task (3/3 steps) with assistance level: (d/c goal 2-20 ESD) -- Note: D/c goal 2/20 due to lack of progress-ESD 08/19/2022 1544 by Diontae Route, Daneen Schick, OT Outcome: Not Applicable Flowsheets (Taken 08/19/2022 1544) LTG: Pt will perform toileting task (3/3 steps) with assistance level: (d/c goal 2/20-ESD) -- Note: D/c goal 2/20 due to lack of progress-ESD    Problem: RH  Functional Use of Upper Extremity Goal: LTG Patient will use RT/LT upper extremity as a (OT) Description: LTG: Patient will use right/left upper extremity as a stabilizer/gross assist/diminished/nondominant/dominant level with assist, with/without cues during functional activity (OT) 08/19/2022 1548 by Hiram Mciver, Daneen Schick, OT Flowsheets (Taken 08/19/2022 1548) LTG: Use of upper extremity in functional activities: LUE as a stabilizer LTG: Pt will use upper extremity in functional activity with assistance level of: Moderate Assistance - Patient 50 - 74% Note: Goal downgraded 2/20-ESD 08/19/2022 1548 by Valma Cava, OT Reactivated 08/19/2022 1544 by Valma Cava, OT Outcome: Not Applicable Flowsheets (Taken 08/19/2022 1544) LTG: Use of upper extremity in functional activities: LUE as a stabilizer LTG: Pt will use upper extremity in functional activity with assistance level of: Moderate Assistance - Patient 50 - 74% Note: Goal downgraded 2/20 due to lack of progress-EDD   Problem: RH Toilet Transfers Goal: LTG Patient will perform toilet transfers w/assist (OT) Description: LTG: Patient will perform toilet transfers with assist, with/without cues using equipment (OT) 08/19/2022 1548 by Dhani Dannemiller, Daneen Schick, OT Outcome: Not Applicable Flowsheets (Taken 08/19/2022 1548) LTG: Pt will perform toilet transfers with assistance level of: (d/c gaol 2/20-ESD) -- Note: D/c goal 2/20 due to lack of progress-ESD 08/19/2022 1544 by Aysha Livecchi, Daneen Schick, OT Outcome: Not Applicable Flowsheets (Taken 08/19/2022 1544) LTG: Pt will perform toilet transfers with assistance level of: (d/c goal 2/20-ESD) -- Note: D/c goal 2/20 due to lack of progress-ESD

## 2022-08-20 ENCOUNTER — Inpatient Hospital Stay (HOSPITAL_COMMUNITY): Payer: Medicare HMO

## 2022-08-20 LAB — GLUCOSE, CAPILLARY
Glucose-Capillary: 120 mg/dL — ABNORMAL HIGH (ref 70–99)
Glucose-Capillary: 319 mg/dL — ABNORMAL HIGH (ref 70–99)
Glucose-Capillary: 138 mg/dL — ABNORMAL HIGH (ref 70–99)
Glucose-Capillary: 114 mg/dL — ABNORMAL HIGH (ref 70–99)

## 2022-08-20 MED ORDER — NYSTATIN 100000 UNIT/ML MT SUSP
5.0000 mL | Freq: Four times a day (QID) | OROMUCOSAL | Status: DC
  Administered 2022-08-20 – 2022-08-22 (×8): 500000 [IU] via OROMUCOSAL
  Filled 2022-08-20 (×8): qty 5

## 2022-08-20 MED ORDER — POLYETHYLENE GLYCOL 3350 17 G PO PACK
17.0000 g | PACK | Freq: Every day | ORAL | Status: DC
  Administered 2022-08-21: 17 g
  Filled 2022-08-20 (×2): qty 1

## 2022-08-20 NOTE — Progress Notes (Signed)
Initial Nutrition Assessment  DOCUMENTATION CODES:   Non-severe (moderate) malnutrition in context of chronic illness  INTERVENTION:  - Continue EN regiment to bolus feeds of Glucerna 1.5(5 cans per day).    - This will provide 1777 kcals, 97 gm protein and 900 mL fluid.  - FWF 120 5x daily (60 mL free water before and after each can). - Provides 1500 mL total free water.    -Continue Juven 1 packet per tube BID. Each packet provides 95 kcal, 7 grams L-Arginine, 7 grams L-Glutamine, 2.5 grams collagen protein, 300 mg vitamin C, 9.5 mg zinc, and other micronutrients essential for wound healing.   - Message MD about discontinuing Fibercon.   NUTRITION DIAGNOSIS:   Moderate Malnutrition related to chronic illness as evidenced by moderate fat depletion, moderate muscle depletion, severe muscle depletion.  GOAL:   Patient will meet greater than or equal to 90% of their needs  MONITOR:   Labs, Weight trends, TF tolerance, Skin, I & O's  REASON FOR ASSESSMENT:   Consult Enteral/tube feeding initiation and management, Assessment of nutrition requirement/status, Wound healing  ASSESSMENT:   87 year old female with PMHx of DM, HTN, HLD, CKD admitted with ICH and SDH s/p craniotomy.  Meds reviewed: Vit C, lipitor, pepcid, ferrous sulfate, sliding scale insulin, metformin, fibercon. Labs reviewed: BUN elevated.   Pt is receiving EN bolus feeds via PEG tube and tolerating well. Pt without BM in 3 days. Pt currently has Fibercon ordered. Will message MD about potentially discontinuing. Will continue to monitor tolerance.   Diet Order:   Diet Order             Diet NPO time specified  Diet effective now                   EDUCATION NEEDS:   Not appropriate for education at this time  Skin:  Skin Assessment: Skin Integrity Issues: Skin Integrity Issues:: Stage II, Incisions Stage II: sacrum Incisions: closed incision to head  Last BM:  08/17/22  Height:   Ht Readings  from Last 1 Encounters:  08/02/22 5' 3"$  (1.6 m)    Weight:   Wt Readings from Last 1 Encounters:  08/19/22 95.5 kg    Ideal Body Weight:  52.3 kg  BMI:  Body mass index is 37.3 kg/m.  Estimated Nutritional Needs:   Kcal:  1500-1700  Protein:  75-90 grams  Fluid:  > 1.5 L/day  Gina Kelley, RD, LDN, CNSC.

## 2022-08-20 NOTE — Progress Notes (Signed)
Physical Therapy Session Note  Patient Details  Name: Gina Kelley MRN: [id number] Date of Birth: 04-12-1936  Today's Date: 08/20/2022 PT Individual Time: 1st Treatment Session: [id number]; 2nd Treatment Session: [id number] PT Individual Time Calculation (min): 15 min; 75 min  Short Term Goals: Week 3:  PT Short Term Goal 1 (Week 3): Patient will perform bed/chair transfer with LRAD and ModA PT Short Term Goal 2 (Week 3): Patient will maintain dynamic sitting balance with CGA PT Short Term Goal 3 (Week 3): Patient will initiate sit/stand with LRAD  Skilled Therapeutic Interventions/Progress Updates:  1st Treatment Session- Patient greeted supine in bed upon arrival and agreeable to transport to radiology for scheduled MBS. Therapist ensured patient was not soiled upon entry and had recently been changed by NT. Therapist ambulated downstairs with transport team and assisted with transferring patient from bed to radiology chair via lateral slide with sheets. Once supine on flat radiology chair, patient was put into the upright position with chest strap donned in order to ensure midline posturing. Therapist placed pillow under patient's L UE in order to decrease L lateral lean. Radiology tech and SLP were educated on ensuring R elbow is flexed throughout session in order to decrease pushing to the L. Patient left sitting upright with appropriate midline posturing with radiology tech and SLP present and all needs met.    2nd Treatment Session- Patient greeted sitting upright in TIS wheelchair with lite-gait harness donned and already connected to lite-gait in preparation for treatment session. Patient gait trained in lite-gait with B UE support on eva walker with OT managing lite-gait, rehab tech managing eva walker and PT managing patient throughout gait trial. Patient ambulated ~50', turn around and then ambulated another 75+ feet while therapist provided multimodal cues for improved postural  extension forward gaze, R lateral weight shifting for improved L LE clearance, and advancing L LE throughout swing phase along with improved placement secondary to significant adductor tone and impaired motor planning/ataxia. While ambulating patient stated "I feel sick" and was returned to a seated position in TIS wheelchair- Patient visibly sweating and required an emesis bag. Patient returned to her room where therapist performed suction to clear the emesis and OT placed cold towels on her forehead while BP was assessed- BP while sitting 89/46 and after 5 minutes of sitting 98/60. Patient transferred back to bed via dependent squat pivot for time management secondary to explosive BM leaking through her pants. Patient transitioned to supine with [place] x2. Patient rolled L and R several times with Mod/MaxA in order for extensive pericare, donning clean brief and wiping down bed. RN present to administer meds and patient was left semi-reclined (>30 degrees) in bed with L UE elevated, call bell in lap and all needs met.    Therapy Documentation Precautions:  Precautions Precautions: Fall Precaution Comments: peg tube. HOB no lower than 30*. Left dense hemi. right gaze preference, left side inattention Required Braces or Orthoses: Other Brace Other Brace: PRAFO boots on in bed Restrictions Weight Bearing Restrictions: No   Therapy/Group: Individual Therapy  Gina Kelley 08/20/2022, 7:47 AM

## 2022-08-20 NOTE — Progress Notes (Signed)
Occupational Therapy Session Note  Patient Details  Name: Gina Kelley MRN: [id number] Date of Birth: Jun 29, 1936  Today's Date: 08/20/2022 OT Individual Time: 1100-1200 OT Individual Time Calculation (min): 60 min    Short Term Goals: Week 1:  OT Short Term Goal 1 (Week 1): Pt will complete UB bathing with Max A seated on EOB or at sink. OT Short Term Goal 1 - Progress (Week 1): Met OT Short Term Goal 2 (Week 1): Pt will complete LB bating with Max A seated on EOB or at sink. OT Short Term Goal 2 - Progress (Week 1): Progressing toward goal OT Short Term Goal 3 (Week 1): Pt will increase bed mobility while transitioning to seated on EOB from supine with Max A. OT Short Term Goal 3 - Progress (Week 1): Progressing toward goal OT Short Term Goal 4 (Week 1): Pt will increase static sitting balance to [employer] while completing functional self care task. OT Short Term Goal 4 - Progress (Week 1): Met OT Short Term Goal 5 (Week 1): Pt will increase attention to her immediate environment by locating all items needed for grooming task when placed to her left. OT Short Term Goal 5 - Progress (Week 1): Progressing toward goal  Skilled Therapeutic Interventions/Progress Updates:    Initially pt off unit fo chest Xray. Pt missed 15 min skilled OT. Will make up as able per POC. Pt received in bed with no pain. Of note Pt with ACTIVE SHOULDER + ELBOW FLEX/EXT!!! ADL: Pt completes ADL at overall MAX-total A d/t L neglect, pushing, cognition and poor rolling Level. Skilled interventions include: cuing for hooklying position to help with rolling in B directions to cleanse buttocks as well as don new brief/pants. MAX A+2 squat pivot transfer to TIS. MAX A to don shirt with HOH A to pull shirt over L sleeve. Pt helping lift LUE from shoulder to put into sleeve! Pt needs partial A to pull down trunk  Therapeutic activity Seated visual scanning/reaching task in TIS reaching R with RUE and encouragement  to break up pushing tendencies and then place on counter on L. Pt needs MIN facilitation to turn head fully to L to locate counter.   Pt completes pull/push with LUE with multimodal cuing for L attention d/t distracting environment. Pt able ot tolerate min manual resistance especially for pushing LUE forward!  Pt left at end of session in TIS with exit alarm on, call light in reach and all needs met   Therapy Documentation Precautions:  Precautions Precautions: Fall Precaution Comments: peg tube. HOB no lower than 30*. Left dense hemi. right gaze preference, left side inattention Required Braces or Orthoses: Other Brace Other Brace: PRAFO boots on in bed Restrictions Weight Bearing Restrictions: No Therapy/Group: Individual Therapy  Tonny Branch 08/20/2022, 6:54 AM

## 2022-08-20 NOTE — Procedures (Addendum)
Modified Barium Swallow Study  Patient Details  Name: Gina Kelley MRN: [id number] Date of Birth: May 21, 1936  Today's Date: 08/20/2022  Modified Barium Swallow completed.  Full report located under Chart Review in the Imaging Section.  History of Present Illness Pt is an 87 year old female who presented to the ED on 1/9 as a Code Stroke after being found unconscious, nonverbal, with left hemiplegia. CT Head demonstrated right IPH with 38m midline shift and right frontoparietal SDH. Taken to OR 1/9 for R craniotomy/SDH evacuation. Postoperative repeat CT Head 1/10 demonstrated resolution of SDH post-crani and some residual hemorrhage without evidence of mass effect.  CT head 1/12 with Similar size of residual intraparenchymal hemorrhage centered at the right frontal operculum, but with mildly increased localized edema. Mass effect with approximately 5 mm of right-to-left shift, mildly increased from prior. Increased prominence of small focal hypodensity involving the right basal ganglia, suspicious for an evolving ischemic infarct. Abnormal vasogenic edema involving the anterior right frontal lobe, concerning for underlying tumor/mass.  ETT 1/9-1/15; 1/15-18; 1/18-123 (1-way extubation). Palliative care consulted to assist with [name] in the setting of multiple failed extubations. Cortrak placed 1/15. PMH: HTN, HLD, CVA (1998 with residual LLE numbness), T2DM, osteopenia.   Clinical Impression MBSS completed to assess readiness for diet initiation in the setting of subacute SDH, prolonged NPO status, and deconditioning. Pt presents with moderate-severe oral and mild pharyngeal dysphagia. From an oral standpoint, pt exhibits decreased bolus cohesion + manipulation, incomplete A-P transit resulting in oral residuals, and swallow initiation at various points (more viscous textures at the level of the valleculae and over epiglottis, with less viscous at the level of the pyriform sinuses). Pharyngeally,  laryngeal elevation is minimal (suspect due to anatomy) with some decreased BOT approximation to PPW, and decreased laryngeal vestibule closure with thin liquids only.   Aforementioned deficits resulted in sensed penetration with resultant aspiration (posterior) of thin liquid consistency via tsp on both trials; therefore, did not progress to cup. No penetration nor aspiration observed with intake of mildly thick/nectar-thick via tsp, moderately-thick/honey-thick via tsp, nor pureed consistencies. Of note, attempted to provide liquids via cup, though difficult for pt to manipulate due to motoric and orofacial limitations. Minimal to no pharyngeal residuals were noted post-swallow. Compensatory strategies of straw and double swallow to clear oral residuals were not effective.   Given instrumental findings, pt's increased risk factors outlined below, and clinical judgment of pt's medical status/therapy performance, recommend initiation of more aggressive PO trials, with SLP only, of pureed and moderately-thick/honey-thick liquid via tsp at this time. May progress to mildly-thick/nectar-thick at bedside, if no overt s/sx concerning for airway intrusion are noted. If pt tolerates more aggressive trials with SLP, recommend initiation of PO diet of these consistencies. Recommend continuation of medication administration via alternate means for now; may progress to crushed in puree with diet initiation. Pt, staff, and family education will need to be completed re: MBSS findings, recommendations, and safe swallowing strategies. Pt in agreement with proposed plan. Please see imaging for full report. Thank you for allowing [facility] care for Gina Kelley.   Factors that may increase risk of adverse event in presence of aspiration ([id number]): Reduced cognitive function;Limited mobility;Frail or deconditioned;Dependence for feeding and/or oral hygiene;Weak cough  Swallow Evaluation  Recommendations Recommendations: NPO;Alternative means of nutrition - G Tube (Will complete trials of puree and honey-thick liquids in therapy and progress to PO diet as able) PO Diet Recommendation: Dysphagia 1 (Pureed);Moderately thick liquids (Level 3, honey  thick) (when ready) Liquid Administration via: Spoon Medication Administration: Via alternative means Supervision: Full assist for feeding Swallowing strategies  : Minimize environmental distractions;Slow rate;Small bites/sips;Check for pocketing or oral holding;Check for anterior loss Postural changes: Position pt fully upright for meals;Stay upright 30-60 min after meals;Out of bed for meals Oral care recommendations: Oral care QID (4x/day)    Rayyan Burley A Shaquille Janes 08/20/2022,10:28 AM

## 2022-08-20 NOTE — Progress Notes (Signed)
Notified PA (Setzer) notified of chest x-ray results.    Yehuda Mao, LPN

## 2022-08-20 NOTE — Progress Notes (Signed)
PROGRESS NOTE   Subjective/Complaints:  Pt without any complaints this AM. Denies any pain in LUE or PEG tube site. Per therapies, mood improved. MBS this AM with following results:  initiation of more aggressive PO trials, with SLP only, of pureed and moderately-thick/honey-thick liquid via tsp at this time. May progress to mildly-thick/nectar-thick at bedside, if no overt s/sx concerning for airway intrusion are noted. If pt tolerates more aggressive trials with SLP, recommend initiation of PO diet of these consistencies.   Last large BM 2/18; small this AM. KUB showed moderate stool burden.   ROS: limited due to language/communication +L shoulder pain - resolved.   Objective:   DG Abd 1 View  Result Date: 08/20/2022 CLINICAL DATA:  Constipation, vomiting EXAM: ABDOMEN - 1 VIEW COMPARISON:  Radiograph 07/25/2022 FINDINGS: Gastrostomy tube overlies the left hemiabdomen. Contrast material in the stomach, distal small bowel, and proximal colon from recent modified barium swallow study. No evidence of bowel obstruction. Moderate stool burden. Degenerative changes of the spine and hips. IMPRESSION: No evidence of bowel obstruction.  Moderate stool burden. Gastrostomy tube overlies the stomach. Electronically Signed   By: Maurine Simmering M.D.   On: 08/20/2022 11:12   DG Swallowing Func-Speech Pathology  Result Date: 08/20/2022 Table formatting from the original result was not included. Modified Barium Swallow Study Patient Details Name: Gina Kelley MRN: [id number] Date of Birth: Nov 04, 1935 Today's Date: 08/20/2022 HPI/PMH: HPI: Pt is an 87 year old female who presented to the ED on 1/9 as a Code Stroke after being found unconscious, nonverbal, with left hemiplegia. CT Head demonstrated right IPH with 14m midline shift and right frontoparietal SDH. Taken to OR 1/9 for R craniotomy/SDH evacuation. Postoperative repeat CT Head 1/10 demonstrated  resolution of SDH post-crani and some residual hemorrhage without evidence of mass effect.  CT head 1/12 with Similar size of residual intraparenchymal hemorrhage centered at the right frontal operculum, but with mildly increased localized edema. Mass effect with approximately 5 mm of right-to-left shift, mildly increased from prior. Increased prominence of small focal hypodensity involving the right basal ganglia, suspicious for an evolving ischemic infarct. Abnormal vasogenic edema involving the anterior right frontal lobe, concerning for underlying tumor/mass.  ETT 1/9-1/15; 1/15-18; 1/18-123 (1-way extubation). Palliative care consulted to assist with [name] in the setting of multiple failed extubations. Cortrak placed 1/15. PMH: HTN, HLD, CVA (1998 with residual LLE numbness), T2DM, osteopenia. Clinical Impression: Clinical Impression: MBSS completed to assess readiness for diet initiation in the setting of subacute SDH, prolonged NPO status, and deconditioning. Pt presents with moderate-severe oral and mild pharyngeal dysphagia. From an oral standpoint, pt exhibits decreased bolus cohesion + manipulation, incomplete A-P transit resulting in oral residuals, and swallow initiation at various points (more viscous textures at the level of the valleculae and over epiglottis, with less viscous at the level of the pyriform sinuses). Pharyngeally, laryngeal elevation is minimal (suspect due to anatomy) with some decreased BOT approximation to PPW, and decreased laryngeal vestibule closure with thin liquids only.  Aforementioned deficits resulted in sensed penetration with resultant aspiration (posterior) of thin liquid consistency via tsp on both trials; therefore, did not progress to cup. No penetration nor aspiration observed with  intake of mildly thick/nectar-thick via tsp, moderately-thick/honey-thick via tsp, nor pureed consistencies. Of note, attempted to provide liquids via cup, though difficult for pt to  manipulate due to motoric and orofacial limitations. Minimal to no pharyngeal residuals were noted post-swallow. Compensatory strategies of straw and double swallow to clear oral residuals were not effective.  Given instrumental findings, pt's increased risk factors outlined below, and clinical judgment of pt's medical status/therapy performance, recommend initiation of more aggressive PO trials, with SLP only, of pureed and moderately-thick/honey-thick liquid via tsp at this time. May progress to mildly-thick/nectar-thick at bedside, if no overt s/sx concerning for airway intrusion are noted. If pt tolerates more aggressive trials with SLP, recommend initiation of PO diet of these consistencies. Pt, staff, and family education will need to be completed re: MBSS findings, recommendations, and safe swallowing strategies. Pt in agreement with proposed plan. Please see imaging for full report. Thank you for allowing [place] to care for Gina Kelley. Factors that may increase risk of adverse event in presence of aspiration ([place] 2021): Factors that may increase risk of adverse event in presence of aspiration ([place] 2021): Reduced cognitive function; Limited mobility; Frail or deconditioned; Dependence for feeding and/or oral hygiene; Weak cough Recommendations/Plan: Swallowing Evaluation Recommendations Swallowing Evaluation Recommendations Recommendations: NPO; Alternative means of nutrition - G Tube (Will complete trials of puree and honey-thick liquids in therapy and progress to PO diet as able) PO Diet Recommendation: Dysphagia 1 (Pureed); Moderately thick liquids (Level 3, honey thick) (when ready) Liquid Administration via: Spoon Medication Administration: Via alternative means Supervision: Full assist for feeding Swallowing strategies  : Minimize environmental distractions; Slow rate; Small bites/sips; Check for pocketing or oral holding; Check for anterior loss Postural changes: Position pt  fully upright for meals; Stay upright 30-60 min after meals; Out of bed for meals Oral care recommendations: Oral care QID (4x/day) Treatment Plan Treatment Plan Treatment recommendations: Therapy as outlined in treatment plan below Follow-up recommendations: Skilled nursing-short term rehab (<3 hours/day) Interventions: Aspiration precaution training; Compensatory techniques; Patient/family education; Trials of upgraded texture/liquids; Diet toleration management by SLP Recommendations Recommendations for follow up therapy are one component of a multi-disciplinary discharge planning process, led by the attending physician.  Recommendations may be updated based on patient status, additional functional criteria and insurance authorization. Assessment: Orofacial Exam: Orofacial Exam Oral Cavity - Dentition: Edentulous Anatomy: Anatomy: WFL Thin Liquids: Thin Liquids (Level 0) Thin Liquids : Impaired Bolus delivery method: Spoon Thin Liquid - Impairment: Oral Impairment; Pharyngeal impairment Lip Closure: Escape beyond mid-chin Tongue control during bolus hold: Posterior escape of greater than half of bolus Bolus transport/lingual motion: Repetitive/disorganized tongue motion Oral residue: Residue collection on oral structures Location of oral residue : Floor of mouth; Tongue; Palate; Lateral sulci Initiation of swallow : Pyriform sinuses Soft palate elevation: No bolus between soft palate (SP)/pharyngeal wall (PW) Laryngeal elevation: Minimal superior movement of thyroid cartilage with minimal approximation of arytenoids to epiglottic petiole Anterior hyoid excursion: Complete Epiglottic movement: Complete Laryngeal vestibule closure: Incomplete, narrow column air/contrast in laryngeal vestibule Pharyngeal contraction (A/P view only): N/A Pharyngoesophageal segment opening: Complete distension and complete duration, no obstruction of flow Tongue base retraction: Wide column of contrast or air between tongue base and  PPW Pharyngeal residue: Complete pharyngeal clearance Location of pharyngeal residue: N/A Penetration/Aspiration Scale (PAS) score: 7.  Material enters airway, passes BELOW cords and not ejected out despite cough attempt by patient  Mildly Thick Liquids: Mildly thick liquids (Level 2, nectar thick) Mildly  thick liquids (Level 2, nectar thick): Impaired Bolus delivery method: Spoon (attempted cup with minimal success due to oral deficits) Mildly Thick Liquid - Impairment: Oral Impairment; Pharyngeal impairment Lip Closure: Escape beyond mid-chin Tongue control during bolus hold: Posterior escape of greater than half of bolus Bolus transport/lingual motion: Repetitive/disorganized tongue motion Oral residue: Residue collection on oral structures Location of oral residue : Floor of mouth; Tongue; Palate; Lateral sulci Initiation of swallow : Valleculae Soft palate elevation: No bolus between soft palate (SP)/pharyngeal wall (PW) Laryngeal elevation: Minimal superior movement of thyroid cartilage with minimal approximation of arytenoids to epiglottic petiole Anterior hyoid excursion: Complete Epiglottic movement: Complete Laryngeal vestibule closure: Complete, no air/contrast in laryngeal vestibule Pharyngeal stripping wave : Present - complete Pharyngeal contraction (A/P view only): N/A Pharyngoesophageal segment opening: Complete distension and complete duration, no obstruction of flow Tongue base retraction: Narrow column of contrast or air between tongue base and PPW Pharyngeal residue: Complete pharyngeal clearance Penetration/Aspiration Scale (PAS) score: 1.  Material does not enter airway  Moderately Thick Liquids: Moderately thick liquids (Level 3, honey thick) Moderately thick liquids (Level 3, honey thick): Impaired Bolus delivery method: Spoon Moderately Thick Liquid - Impairment: Oral Impairment; Pharyngeal impairment Lip Closure: Escape progressing to mid-chin Tongue control during bolus hold: Posterior  escape of less than half of bolus Bolus transport/lingual motion: Repetitive/disorganized tongue motion Oral residue: Residue collection on oral structures Location of oral residue : Floor of mouth; Tongue; Palate; Lateral sulci Initiation of swallow : Posterior laryngeal surface of the epiglottis Soft palate elevation: No bolus between soft palate (SP)/pharyngeal wall (PW) Laryngeal elevation: Minimal superior movement of thyroid cartilage with minimal approximation of arytenoids to epiglottic petiole Anterior hyoid excursion: Complete Epiglottic movement: Complete Laryngeal vestibule closure: Complete, no air/contrast in laryngeal vestibule Pharyngeal stripping wave : Present - complete Pharyngeal contraction (A/P view only): N/A Pharyngoesophageal segment opening: Complete distension and complete duration, no obstruction of flow Tongue base retraction: Narrow column of contrast or air between tongue base and PPW Pharyngeal residue: Trace residue within or on pharyngeal structures Location of pharyngeal residue: Valleculae Penetration/Aspiration Scale (PAS) score: 1.  Material does not enter airway  Puree: Puree Puree: Impaired Puree - Impairment: Oral Impairment; Pharyngeal impairment Lip Closure: Escape from interlabial space or lateral juncture, no extension beyond vermillion border Bolus transport/lingual motion: Repetitive/disorganized tongue motion Oral residue: Residue collection on oral structures Location of oral residue : Tongue; Palate; Floor of mouth; Lateral sulci Initiation of swallow: Valleculae Soft palate elevation: No bolus between soft palate (SP)/pharyngeal wall (PW) Laryngeal elevation: Minimal superior movement of thyroid cartilage with minimal approximation of arytenoids to epiglottic petiole Anterior hyoid excursion: Complete Epiglottic movement: Complete Laryngeal vestibule closure: Complete, no air/contrast in laryngeal vestibule Pharyngeal stripping wave : Present - complete Pharyngeal  contraction (A/P view only): N/A Pharyngoesophageal segment opening: Complete distension and complete duration, no obstruction of flow Tongue base retraction: Trace column of contrast or air between tongue base and PPW Pharyngeal residue: Complete pharyngeal clearance Location of pharyngeal residue: N/A Penetration/Aspiration Scale (PAS) score: 1.  Material does not enter airway Solid: Solid Solid: Not Tested Pill: Pill Pill: Not Tested Compensatory Strategies: Compensatory Strategies Compensatory strategies: Yes Straw: Ineffective Ineffective Straw: Mildly thick liquid (Level 2, nectar thick) Multiple swallows: Ineffective Ineffective Multiple Swallows: Mildly thick liquid (Level 2, nectar thick); Moderately thick liquid (Level 3, honey thick)   General Information: Caregiver present: No  Diet Prior to this Study: G-tube; NPO   Temperature : Normal   Respiratory Status:  WFL   Supplemental O2: None (Room air)   History of Recent Intubation: Yes  Behavior/Cognition: Alert; Cooperative Self-Feeding Abilities: Dependent for feeding Baseline vocal quality/speech: Dysphonic Volitional Cough: Able to elicit Volitional Swallow: Able to elicit Exam Limitations: No limitations Goal Planning: Prognosis for improved oropharyngeal function: Fair Barriers to Reach Goals: Time post onset; Overall medical prognosis; Cognitive deficits No data recorded Patient/Family Stated Goal: none stated Consulted and agree with results and recommendations: Patient; Physician; Nurse Pain: Pain Assessment Pain Assessment: 0-10 Pain Score: 0 Faces Pain Scale: 4 Breathing: 0 Negative Vocalization: 0 Facial Expression: 0 Body Language: 0 Consolability: 0 PAINAD Score: 0 Facial Expression: 0 Body Movements: 0 Muscle Tension: 0 Compliance with ventilator (intubated pts.): 0 Vocalization (extubated pts.): N/A CPOT Total: 0 Pain Location: R facial swelling and neck; R knee Pain Descriptors / Indicators: Grimacing Pain Intervention(s): Monitored during  session End of Session: Start Time:SLP Start Time (ACUTE ONLY): 0934 Stop Time: SLP Stop Time (ACUTE ONLY): 0944 Time Calculation:SLP Time Calculation (min) (ACUTE ONLY): 10 min Charges: SLP Evaluations $ SLP Speech Visit: 1 Visit SLP Evaluations $BSS Swallow: 1 Procedure $ SLP EVAL LANGUAGE/SOUND PRODUCTION: 1 Procedure $Swallowing Treatment: 1 Procedure $Speech Treatment for Individual: 1 Procedure SLP visit diagnosis: SLP Visit Diagnosis: Dysphagia, oropharyngeal phase (R13.12); Cognitive communication deficit (R41.841); Dysarthria and anarthria (R47.1) Past Medical History: Past Medical History: Diagnosis Date  Anemia of chronic disease 07/08/2006  Antral ulcer 02/23/2007  Seen on EGD in 2008, small ulcer with erosion   Cerebral vascular accident ([place]) 04/14/2006  1998, left lower extremity numbness, no residual deficits   Essential hypertension 04/14/2006  Gastroesophageal reflux disease 02/18/2013  Occasional, symptomatically relieved with [place] maintenance 12/04/2011  Hyperlipidemia LDL goal < 100 04/14/2006  Hypertensive retinopathy of both eyes, grade 1 05/15/2016  Osteopenia of right femoral neck 10/20/2016  DEXA (10/16/2016): R femur T -2.5 (FRAX tool calculates at -2.4), L1-L4 spine T -0.9, 10 year risk for: Major osteoporotic fracture 8.3%, Hip fracture 2.7%  Type 2 diabetes mellitus with stage 1 chronic kidney disease, without long-term current use of insulin (HCC)   Type II diabetes mellitus ([place]) 04/14/2006 Past Surgical History: Past Surgical History: Procedure Laterality Date  COLONOSCOPY    CRANIOTOMY Right 07/08/2022  Procedure: RIGHT CRANIOTOMY HEMATOMA EVACUATION SUBDURAL;  Surgeon: Earnie Larsson, MD;  Location: [facility];  Service: Neurosurgery;  Laterality: Right;  ESOPHAGOGASTRODUODENOSCOPY    ESOPHAGOGASTRODUODENOSCOPY (EGD) WITH PROPOFOL N/A 08/01/2022  Procedure: ESOPHAGOGASTRODUODENOSCOPY (EGD) WITH PROPOFOL;  Surgeon: Jesusita Oka, MD;  Location: [facility];  Service:  General;  Laterality: N/A;  PEG PLACEMENT N/A 08/01/2022  Procedure: PERCUTANEOUS ENDOSCOPIC GASTROSTOMY (PEG) PLACEMENT;  Surgeon: Jesusita Oka, MD;  Location: [facility];  Service: General;  Laterality: N/A; Bethany A Lutes 08/20/2022, 10:45 AM  Recent Labs    08/18/22 0555  WBC 6.4  HGB 8.4*  HCT 27.3*  PLT 284     Recent Labs    08/18/22 0555  NA 140  K 3.7  CL 100  CO2 30  GLUCOSE 165*  BUN 37*  CREATININE 0.58  CALCIUM 8.5*     No intake or output data in the 24 hours ending 08/20/22 1414         Physical Exam: Vital Signs Blood pressure 129/70, pulse 79, temperature 98.4 F (36.9 C), resp. rate 16, height 5' 3"$  (1.6 m), weight 94 kg, SpO2 100 %.  Constitutional: No distress . Vital signs reviewed. Appears comfortable in bed HEENT: NCAT, lip swelling, L>R ,  appears dependent - stable  + white plaques on tongue and roof of mouth, with tongue swelling   Neck: supple, forward flexed. Has inflatable neck pillow which was readjusted.   Cardiovascular: RRR without murmur. No JVD    Respiratory/Chest: CTAB, No RRW, Normal effort.  GI/Abdomen: Normal active bowel sounds, soft, nontender +PEG, c/d/i Ext: no clubbing, cyanosis. LUE 1+ Edema - stable Psych: pleasant and cooperative  Skin: C/D/I. No apparent lesions.    Prior exam: + Sacral wound  - scabbed - improved  MSK:      1/2 fingerbreadth subluxation L shoulder - stable      Strength:                 LUE: Flaccid , 0/5 throughout                LLE:  antigravity HF, KE   Neurologic exam:  Cognition: AAO to person, place, and time with options. Follows simple commands, reacts to verbal input. Improved alertness this AM.    Insight: Fair insight into current condition. Knows she had a stroke.   + LUE Hoffman's; +L facial droop. + L hemineglect, eyes can now pass midline. + L shoulder weakness--no change Spasticity:  none  Assessment/Plan: 1. Functional deficits which require 3+ hours per day of  interdisciplinary therapy in a comprehensive inpatient rehab setting. Physiatrist is providing close team supervision and 24 hour management of active medical problems listed below. Physiatrist and rehab team continue to assess barriers to discharge/monitor patient progress toward functional and medical goals  Care Tool:  Bathing    Body parts bathed by patient: Face   Body parts bathed by helper: Left lower leg, Right lower leg, Left upper leg, Right upper leg, Buttocks, Front perineal area, Abdomen, Chest, Left arm, Right arm     Bathing assist Assist Level: Total Assistance - Patient < 25%     Upper Body Dressing/Undressing Upper body dressing   What is the patient wearing?: Pull over shirt    Upper body assist Assist Level: Total Assistance - Patient < 25%    Lower Body Dressing/Undressing Lower body dressing      What is the patient wearing?: Pants, Incontinence brief     Lower body assist Assist for lower body dressing: Dependent - Patient 0%     Toileting Toileting    Toileting assist Assist for toileting: Dependent - Patient 0%     Transfers Chair/bed transfer  Transfers assist  Chair/bed transfer activity did not occur: Safety/medical concerns (unable 2/2 pain, fatigue, weakness)  Chair/bed transfer assist level: 2 Helpers     Locomotion Ambulation   Ambulation assist   Ambulation activity did not occur: Safety/medical concerns (unable 2/2 pain, fatigue, weakness)          Walk 10 feet activity   Assist  Walk 10 feet activity did not occur: Safety/medical concerns        Walk 50 feet activity   Assist Walk 50 feet with 2 turns activity did not occur: Safety/medical concerns         Walk 150 feet activity   Assist Walk 150 feet activity did not occur: Safety/medical concerns         Walk 10 feet on uneven surface  activity   Assist Walk 10 feet on uneven surfaces activity did not occur: Safety/medical concerns          Wheelchair     Assist Is the patient using a wheelchair?: Yes Type of  Wheelchair: Educational psychologist activity did not occur: Safety/medical concerns (unable 2/2 pain, fatigue, weakness)    Max wheelchair distance: 0    Wheelchair 50 feet with 2 turns activity    Assist    Wheelchair 50 feet with 2 turns activity did not occur: Safety/medical concerns       Wheelchair 150 feet activity     Assist  Wheelchair 150 feet activity did not occur: Safety/medical concerns       Blood pressure 129/70, pulse 79, temperature 98.4 F (36.9 C), resp. rate 16, height 5' 3"$  (1.6 m), weight 94 kg, SpO2 100 %.    Medical Problem List and Plan: 1. Functional deficits secondary to subdural hematoma and ICH             - patient may shower with PEG site covered - ELOS/Goals: goal DC 08/27/22 , downgraded to Mod-Max Ax1 PT/OT/SLP             - Does well communicating with writing board and fingers for y/n             - Family requests change from DNR to full code on admission; changed   - Continue CIR therapies including PT, OT, and SLP  - 2/6 ordered L PRAFO, WHO, and soft cervical collar to help with head positioning OOB - 2/16: family meeting regarding Dispo plan, recommending full time skilled nursing care for significant deficits, family discussing [name] vs. Private care - now placement  2.  Antithrombotics: -DVT/anticoagulation:  Mechanical:  Antiembolism stockings, knee (TED hose) Bilateral lower extremities; add SCDs             -antiplatelet therapy: none - Duplex LUE - 2/8 superficial vein thrombosis involving the left cephalic vein-- warm compresses and conservative treatment   3. Pain Management: Tylenol and Robaxin 583m q6h as needed  - improved   - 2/6: lidocaine patch to L shoulder for pain - benefitting    - 2/9: voltaren gel 2g to L shoulder, b/l knees  - 2/15: schedule Tylenol 650 mg Q8H, gabapentin 300 mg TID Prn   - 2/16: schedule gabapentin 100 mg TID -  improved  4. Mood/Behavior/Sleep: LCSW to evaluate and provide emotional support             -antipsychotic agents: n/a  -Trazodone PRN - 2/20: will start Fluoxetine 20 mg QHS for depressed mood and motor recovery    5. Neuropsych/cognition: This patient is not capable of making decisions on her own behalf.   6. Skin/Wound Care: Routine skin care checks - coccyx PI with scab  - routine care - LAL mattress - Dressings under PEG tube site to keep area dry, as appears moisture trapping under button 2/9 - improved - Nystatin PO QID for candida, will need suctioned after use 2/21   7. Fluids/Electrolytes/Nutrition: Routine Is and Os and follow-up chemistries             -NPO; has PEG             -continue Glucerna 1.5 continuous Tfs  - 2/9: Add potasium supplementation 20 meq BID; 2/11 dced - 2/12: Add 40 meq daily for K 3.4 ;Repeat in 2-3 days--not done, f/up on next labs 08/18/22 - stable 3.7  - Protein calorie malnutrition - on Juven BID, vitamins - Consult dietary to follow for titration - BG mildly elevated but in good range given age, monitor - 2/14: per dietary, cannot transition to bolus feeds d/t J tube. Looking into reducing  hours of TF in daytime for ease of admin in home setting.  - 2/15: confirmed G tube with no J component; switching to bolus feeds. Will need to switch BG and insulin to [facility] and HS.   - 08/16/22: well controlled, changed CBG checks and SSI to TID AC+QHS  -08/17/22 CBGs looking great, cont to monitor  2/19: elevated BG yesterday; monitor 1+ day for adjustments  2/20: MBS 2/21 scheduled; BG elevated, will start Metformin 500 mg QAM per tube  2/21: MBS results as above; starting PO trials with SLP!Marland Kitchen Monitor BG with initiation of metformin QD   Recent Labs    08/19/22 2053 08/20/22 0549 08/20/22 1209  GLUCAP 170* 120* 319*       8: Hypertension: monitor TID and prn             -continue amlodipine 10 mg daily             -losartan 50 mg daily -> Dced 2/3 d/t  concern for angioedema               - Add hydralazine 10 mg q8h PRN for SBP >180, DBP>100 -2/4: BP stable/high; mild tachycardia this AM; if AM labs do not show dehydration, would start HCTZ 12.5 mg daily for adjunctive control -2/5 BP with improvement over last 12 hours--will hold off on adding any further medication right now.  -2/6 - mild HTN, no med changes, labs in AM  -2/7 -   HTN remains mild; start hctz 6.325 mg per tube daily; labs in AM 2/9  -2/9: DC HCTZ, switch to Coreg 3.125 BID, DC K supplement  -2/12: Monitor, if consistently HTN can increase coreg  -2/13: increase to 6.5 mg BID  - 08/17/22 BP/HR well controlled, cont regimen (amlodipine 62m QD, Coreg 6.234mBID, Hydralazine 1011m8h PRN) Vitals:   08/16/22 1924 08/17/22 0437 08/17/22 1947 08/17/22 1948  BP: (!) 141/64 126/73 136/86 136/86   08/18/22 0309 08/18/22 1307 08/18/22 1917 08/19/22 0255  BP: 128/78 (!) 144/74 (!) 132/53 139/65   08/19/22 1307 08/19/22 2017 08/20/22 0432 08/20/22 1307  BP: 124/77 124/63 (!) 148/76 129/70     9: Hyperlipidemia: continue Lipitor 71m44m, Zetia 10mg2m  10: SDH/ICH s/p crani: Finished Keppra for sz ppx.    11: Leukocytosis: current temperature 99.3, ceftazidime completed 1/29 - resolved -Follow-up CBC with differential, mild WBC increase 2.2 likely d/t POD#1; downtrending 2/3 - Fever overnight prior to admission; POD from PEG, monitor for recurrence  - No fever recurrence 2/3; monitor - 2/6: Labs in AM. CXR today for course lung sounds, and duplex for LUE swelling. Strict oral care QID    - 2/7: AM labs with mild subsegmental atelectasis on R; encourage ISB  -2/10 WBC 7.0 on 2/7, no signs of infection  -08/11/22 WBC 6.4, cont to monitor on weekly labs, next 08/18/22 - WNL  12: Anemia, multifactorial:  Stable HgB 8-9             -Follow-up CBC -hgb with sl decrease again today, normocytic  -check stool for OB  -PA added b-supp. Add Fe++ too  - 2/9: transfuse 1 unit for HgB 6.4  today. FOBT pending.   -2/10 FOBT pos, recheck, recheck H&H tomorrow, consider GI consult -2/11 H/H stable, spoke with GI, no intervention at this time supportive care, GI ordered BMP,  re consult if HGB continues to decrease, appreciate assistance   -2/12: HgB stable, monitor on weekly labs next 08/18/22  13. ?Angioedema vs. Chlorhexadine reaction - improving, appears no dependent - Improving per family after DC chlorhexidine skin wipes and oral mouthwash              - Per SLP notes, interferes with tongue movement, speaking - Hx cough with Enalipril, Losartan increased from OP 25 mg to 50 mg 1/14, never weaned; edema started 1/30 per chart review - d/w pharmacy 2/4 and agree that reaction can occur at any time, and may cross-react with ARB. Will attempt to titrate BP meds without ACE/ARBs and monitor swelling.   - reposition head in bed to avoid dependent edema  14. L hemineglect - causing R gaze preference and positional tone             - Repositioning in bed to straighten head--head more neutral today 2/5 - May need to consider botox given need to recover cognitive function and relatively little tone elsewhere  - 2/6: E stim and taping to L shoulder for mild sublux per OT.   15. Urinary incontinence - ongoing  - Foley removed 2/6  - PVRs QID and ISC for >350 ccs retention - 2/7: 1x PVR low; monitor; ? Incontinence vs mixed, will consider timed toiletting after more information gathered  - 2/12: Start timed toiletting Q4H for urgency  16. Dysphagia, s/p PEG placement.   - Remains NPO  - Strict oral care QID  - CXR  given course breath sounds, weak cough, poor secretion clearance - R subsegmental atelectasis, encourage ISB - See above  17. Diarrhea - stable/improving - loose stools likely d/t tube feeds; on fibercon; will add immodium for >2 diarrhea BM in 24 hours   - 2/15: daily Bms now, improved  -08/16/22 BM x2 today, cont to monitor.   -08/17/22 1 BM this morning during visit,  but overall improving, monitor  - 2/21: KUB with moderate stool burden; DC fibercon, start QD miralax 17 g  18. Bibasilar crackles. CXR IMPRESSION: 1. Mildly decreased lung volumes, although improved from most recent 08/05/2022 radiograph. 2. Right basilar subsegmental atelectasis. No definite focal airspace opacity to indicate pneumonia.  - ISB as tolerated; no other interventions at this time    LOS: 18 days A FACE TO [place] 08/20/2022, 2:14 PM

## 2022-08-21 LAB — GLUCOSE, CAPILLARY
Glucose-Capillary: 132 mg/dL — ABNORMAL HIGH (ref 70–99)
Glucose-Capillary: 180 mg/dL — ABNORMAL HIGH (ref 70–99)
Glucose-Capillary: 201 mg/dL — ABNORMAL HIGH (ref 70–99)
Glucose-Capillary: 131 mg/dL — ABNORMAL HIGH (ref 70–99)
Glucose-Capillary: 121 mg/dL — ABNORMAL HIGH (ref 70–99)

## 2022-08-21 MED ORDER — POLYETHYLENE GLYCOL 3350 17 G PO PACK
17.0000 g | PACK | Freq: Two times a day (BID) | ORAL | Status: DC
  Administered 2022-08-21: 17 g
  Filled 2022-08-21: qty 1

## 2022-08-21 NOTE — Progress Notes (Addendum)
Speech Language Pathology Daily Session Note  Patient Details  Name: Gina Kelley MRN: [id number] Date of Birth: 1935-12-04  Today's Date: 08/21/2022 SLP Individual Time: 1015-1100 SLP Individual Time Calculation (min): 45 min  Short Term Goals: Week 3: SLP Short Term Goal 1 (Week 3): Pt will sustain her attention to functional tasks for ~5 minute intervals with mod cues for redirection. SLP Short Term Goal 2 (Week 3): Pt will locate objects at midline during functional tasks in 75% of opportunities with mod assist multimodal cues. SLP Short Term Goal 3 (Week 3): Pt will use overarticulation and increased vocal intensity to achieve 50% intelligibility at the word level with mod assist multimodal cues. SLP Short Term Goal 4 (Week 3): Pt willutilize written expression during communication breakdowns to express wants/needs with Max A multimodal cues. SLP Short Term Goal 5 (Week 3): Patient will consume trials of thin (wet oral swabs, 1/4 tsps) with minimal overt s/s of aspiration and Mod A multimodal cues for oral containment/swallow initation to assess readiness for MBS.  Skilled Therapeutic Interventions: Pt seen this date for skilled ST intervention targeting swallowing and speech intelligibility/communication goals outlined in care plan. Pt received awake/alert and OOB in TIS. Finishing with nursing. Agreeable to intervention in hospital room. C/o burning sensation (verbally perseverative on this) - pt points to eye, but then points to mouth; ? Difficult to discern what was bothering her. Wet vocal quality prior to initiation of PO - pt benefited from verbal and demonstration cues to produce volitional throat clear.  SLP intervention with emphasis on therapeutic trials of puree and honey-thick/moderately-thick consistencies s/p MBSS completed on 08/20/22. Prior to initiation of PO trials, oral care completed via suction toothbrush with Total A for thoroughness. SLP fed pt controlled trials of  pureed and honey-thick/moderately-thick consistencies, via tsp, with Min A verbal cues to initiate swallow response and open mouth + use lips of clearing boluses from spoon bowl. Swallow initiation remains delayed; however, no overt s/sx concerning for airway intrusion noted with slow intake, ensuring pt swallowed prior to providing additional trials, and assisting with intermittent oral suctioning of oral residuals post-swallow via [place]. Continue to recommend NPO and PO trials with SLP.  Re: speech intelligibility, pt communicated verbally at the word and phrase level with use of hand gestures to aid in comprehensibility - speech was ~50% intelligible at the word-phrase level. Continues to respond best when provided with yes/no questions, extended processing/response time, and repetition.  Pt left in room and OOB in TIS with all safety measures activated, call bell reviewed and within reach, and all immediate needs met. Continue per current ST POC.   Pain Pt reporting "burning" sensation in eye and "all over;" RN made aware.   Therapy/Group: Individual Therapy  Ellison Rieth A Lindon Kiel 08/21/2022, 1:40 PM

## 2022-08-21 NOTE — Progress Notes (Signed)
Patient ID: Gina Kelley, female   DOB: Jul 31, 1935, 87 y.o.   MRN: [id number]  Bed offer extended by [facility].   SW spoke with Lattie Haw R./[employer] Medicare rep (p:[phone]/f:[phone]/ref [id number]) to submit [name] auth with beginning tomorrow. SW faxed requested clinicals.   Loralee Pacas, MSW, [place] Office: [phone] Cell: [phone] Fax: [phone]

## 2022-08-21 NOTE — Progress Notes (Signed)
PROGRESS NOTE   Subjective/Complaints:  No events overnight. No acute complaints. Per OT some new movement in her LUE this AM, affect brighter, more participatory.   ROS: limited due to language/communication +L shoulder pain - resolved.   Objective:   DG Abd 1 View  Result Date: 08/20/2022 CLINICAL DATA:  Constipation, vomiting EXAM: ABDOMEN - 1 VIEW COMPARISON:  Radiograph 07/25/2022 FINDINGS: Gastrostomy tube overlies the left hemiabdomen. Contrast material in the stomach, distal small bowel, and proximal colon from recent modified barium swallow study. No evidence of bowel obstruction. Moderate stool burden. Degenerative changes of the spine and hips. IMPRESSION: No evidence of bowel obstruction.  Moderate stool burden. Gastrostomy tube overlies the stomach. Electronically Signed   By: Maurine Simmering M.D.   On: 08/20/2022 11:12   DG Swallowing Func-Speech Pathology  Result Date: 08/20/2022 Table formatting from the original result was not included. Modified Barium Swallow Study Patient Details Name: Gina Kelley MRN: [id number] Date of Birth: December 27, 1935 Today's Date: 08/20/2022 HPI/PMH: HPI: Pt is an 87 year old female who presented to the ED on 1/9 as a Code Stroke after being found unconscious, nonverbal, with left hemiplegia. CT Head demonstrated right IPH with 68m midline shift and right frontoparietal SDH. Taken to OR 1/9 for R craniotomy/SDH evacuation. Postoperative repeat CT Head 1/10 demonstrated resolution of SDH post-crani and some residual hemorrhage without evidence of mass effect.  CT head 1/12 with Similar size of residual intraparenchymal hemorrhage centered at the right frontal operculum, but with mildly increased localized edema. Mass effect with approximately 5 mm of right-to-left shift, mildly increased from prior. Increased prominence of small focal hypodensity involving the right basal ganglia, suspicious for an  evolving ischemic infarct. Abnormal vasogenic edema involving the anterior right frontal lobe, concerning for underlying tumor/mass.  ETT 1/9-1/15; 1/15-18; 1/18-123 (1-way extubation). Palliative care consulted to assist with [name] in the setting of multiple failed extubations. Cortrak placed 1/15. PMH: HTN, HLD, CVA (1998 with residual LLE numbness), T2DM, osteopenia. Clinical Impression: Clinical Impression: MBSS completed to assess readiness for diet initiation in the setting of subacute SDH, prolonged NPO status, and deconditioning. Pt presents with moderate-severe oral and mild pharyngeal dysphagia. From an oral standpoint, pt exhibits decreased bolus cohesion + manipulation, incomplete A-P transit resulting in oral residuals, and swallow initiation at various points (more viscous textures at the level of the valleculae and over epiglottis, with less viscous at the level of the pyriform sinuses). Pharyngeally, laryngeal elevation is minimal (suspect due to anatomy) with some decreased BOT approximation to PPW, and decreased laryngeal vestibule closure with thin liquids only.  Aforementioned deficits resulted in sensed penetration with resultant aspiration (posterior) of thin liquid consistency via tsp on both trials; therefore, did not progress to cup. No penetration nor aspiration observed with intake of mildly thick/nectar-thick via tsp, moderately-thick/honey-thick via tsp, nor pureed consistencies. Of note, attempted to provide liquids via cup, though difficult for pt to manipulate due to motoric and orofacial limitations. Minimal to no pharyngeal residuals were noted post-swallow. Compensatory strategies of straw and double swallow to clear oral residuals were not effective.  Given instrumental findings, pt's increased risk factors outlined below, and clinical judgment of pt's medical status/therapy  performance, recommend initiation of more aggressive PO trials, with SLP only, of pureed and  moderately-thick/honey-thick liquid via tsp at this time. May progress to mildly-thick/nectar-thick at bedside, if no overt s/sx concerning for airway intrusion are noted. If pt tolerates more aggressive trials with SLP, recommend initiation of PO diet of these consistencies. Pt, staff, and family education will need to be completed re: MBSS findings, recommendations, and safe swallowing strategies. Pt in agreement with proposed plan. Please see imaging for full report. Thank you for allowing [place] to care for Ms. Coate. Factors that may increase risk of adverse event in presence of aspiration ([place] 2021): Factors that may increase risk of adverse event in presence of aspiration ([place] 2021): Reduced cognitive function; Limited mobility; Frail or deconditioned; Dependence for feeding and/or oral hygiene; Weak cough Recommendations/Plan: Swallowing Evaluation Recommendations Swallowing Evaluation Recommendations Recommendations: NPO; Alternative means of nutrition - G Tube (Will complete trials of puree and honey-thick liquids in therapy and progress to PO diet as able) PO Diet Recommendation: Dysphagia 1 (Pureed); Moderately thick liquids (Level 3, honey thick) (when ready) Liquid Administration via: Spoon Medication Administration: Via alternative means Supervision: Full assist for feeding Swallowing strategies  : Minimize environmental distractions; Slow rate; Small bites/sips; Check for pocketing or oral holding; Check for anterior loss Postural changes: Position pt fully upright for meals; Stay upright 30-60 min after meals; Out of bed for meals Oral care recommendations: Oral care QID (4x/day) Treatment Plan Treatment Plan Treatment recommendations: Therapy as outlined in treatment plan below Follow-up recommendations: Skilled nursing-short term rehab (<3 hours/day) Interventions: Aspiration precaution training; Compensatory techniques; Patient/family education; Trials of upgraded  texture/liquids; Diet toleration management by SLP Recommendations Recommendations for follow up therapy are one component of a multi-disciplinary discharge planning process, led by the attending physician.  Recommendations may be updated based on patient status, additional functional criteria and insurance authorization. Assessment: Orofacial Exam: Orofacial Exam Oral Cavity - Dentition: Edentulous Anatomy: Anatomy: WFL Thin Liquids: Thin Liquids (Level 0) Thin Liquids : Impaired Bolus delivery method: Spoon Thin Liquid - Impairment: Oral Impairment; Pharyngeal impairment Lip Closure: Escape beyond mid-chin Tongue control during bolus hold: Posterior escape of greater than half of bolus Bolus transport/lingual motion: Repetitive/disorganized tongue motion Oral residue: Residue collection on oral structures Location of oral residue : Floor of mouth; Tongue; Palate; Lateral sulci Initiation of swallow : Pyriform sinuses Soft palate elevation: No bolus between soft palate (SP)/pharyngeal wall (PW) Laryngeal elevation: Minimal superior movement of thyroid cartilage with minimal approximation of arytenoids to epiglottic petiole Anterior hyoid excursion: Complete Epiglottic movement: Complete Laryngeal vestibule closure: Incomplete, narrow column air/contrast in laryngeal vestibule Pharyngeal contraction (A/P view only): N/A Pharyngoesophageal segment opening: Complete distension and complete duration, no obstruction of flow Tongue base retraction: Wide column of contrast or air between tongue base and PPW Pharyngeal residue: Complete pharyngeal clearance Location of pharyngeal residue: N/A Penetration/Aspiration Scale (PAS) score: 7.  Material enters airway, passes BELOW cords and not ejected out despite cough attempt by patient  Mildly Thick Liquids: Mildly thick liquids (Level 2, nectar thick) Mildly thick liquids (Level 2, nectar thick): Impaired Bolus delivery method: Spoon (attempted cup with minimal success due to  oral deficits) Mildly Thick Liquid - Impairment: Oral Impairment; Pharyngeal impairment Lip Closure: Escape beyond mid-chin Tongue control during bolus hold: Posterior escape of greater than half of bolus Bolus transport/lingual motion: Repetitive/disorganized tongue motion Oral residue: Residue collection on oral structures Location of oral residue : Floor of mouth; Tongue; Palate; Lateral  sulci Initiation of swallow : Valleculae Soft palate elevation: No bolus between soft palate (SP)/pharyngeal wall (PW) Laryngeal elevation: Minimal superior movement of thyroid cartilage with minimal approximation of arytenoids to epiglottic petiole Anterior hyoid excursion: Complete Epiglottic movement: Complete Laryngeal vestibule closure: Complete, no air/contrast in laryngeal vestibule Pharyngeal stripping wave : Present - complete Pharyngeal contraction (A/P view only): N/A Pharyngoesophageal segment opening: Complete distension and complete duration, no obstruction of flow Tongue base retraction: Narrow column of contrast or air between tongue base and PPW Pharyngeal residue: Complete pharyngeal clearance Penetration/Aspiration Scale (PAS) score: 1.  Material does not enter airway  Moderately Thick Liquids: Moderately thick liquids (Level 3, honey thick) Moderately thick liquids (Level 3, honey thick): Impaired Bolus delivery method: Spoon Moderately Thick Liquid - Impairment: Oral Impairment; Pharyngeal impairment Lip Closure: Escape progressing to mid-chin Tongue control during bolus hold: Posterior escape of less than half of bolus Bolus transport/lingual motion: Repetitive/disorganized tongue motion Oral residue: Residue collection on oral structures Location of oral residue : Floor of mouth; Tongue; Palate; Lateral sulci Initiation of swallow : Posterior laryngeal surface of the epiglottis Soft palate elevation: No bolus between soft palate (SP)/pharyngeal wall (PW) Laryngeal elevation: Minimal superior movement of  thyroid cartilage with minimal approximation of arytenoids to epiglottic petiole Anterior hyoid excursion: Complete Epiglottic movement: Complete Laryngeal vestibule closure: Complete, no air/contrast in laryngeal vestibule Pharyngeal stripping wave : Present - complete Pharyngeal contraction (A/P view only): N/A Pharyngoesophageal segment opening: Complete distension and complete duration, no obstruction of flow Tongue base retraction: Narrow column of contrast or air between tongue base and PPW Pharyngeal residue: Trace residue within or on pharyngeal structures Location of pharyngeal residue: Valleculae Penetration/Aspiration Scale (PAS) score: 1.  Material does not enter airway  Puree: Puree Puree: Impaired Puree - Impairment: Oral Impairment; Pharyngeal impairment Lip Closure: Escape from interlabial space or lateral juncture, no extension beyond vermillion border Bolus transport/lingual motion: Repetitive/disorganized tongue motion Oral residue: Residue collection on oral structures Location of oral residue : Tongue; Palate; Floor of mouth; Lateral sulci Initiation of swallow: Valleculae Soft palate elevation: No bolus between soft palate (SP)/pharyngeal wall (PW) Laryngeal elevation: Minimal superior movement of thyroid cartilage with minimal approximation of arytenoids to epiglottic petiole Anterior hyoid excursion: Complete Epiglottic movement: Complete Laryngeal vestibule closure: Complete, no air/contrast in laryngeal vestibule Pharyngeal stripping wave : Present - complete Pharyngeal contraction (A/P view only): N/A Pharyngoesophageal segment opening: Complete distension and complete duration, no obstruction of flow Tongue base retraction: Trace column of contrast or air between tongue base and PPW Pharyngeal residue: Complete pharyngeal clearance Location of pharyngeal residue: N/A Penetration/Aspiration Scale (PAS) score: 1.  Material does not enter airway Solid: Solid Solid: Not Tested Pill: Pill  Pill: Not Tested Compensatory Strategies: Compensatory Strategies Compensatory strategies: Yes Straw: Ineffective Ineffective Straw: Mildly thick liquid (Level 2, nectar thick) Multiple swallows: Ineffective Ineffective Multiple Swallows: Mildly thick liquid (Level 2, nectar thick); Moderately thick liquid (Level 3, honey thick)   General Information: Caregiver present: No  Diet Prior to this Study: G-tube; NPO   Temperature : Normal   Respiratory Status: WFL   Supplemental O2: None (Room air)   History of Recent Intubation: Yes  Behavior/Cognition: Alert; Cooperative Self-Feeding Abilities: Dependent for feeding Baseline vocal quality/speech: Dysphonic Volitional Cough: Able to elicit Volitional Swallow: Able to elicit Exam Limitations: No limitations Goal Planning: Prognosis for improved oropharyngeal function: Fair Barriers to Reach Goals: Time post onset; Overall medical prognosis; Cognitive deficits No data recorded Patient/Family Stated Goal: none stated Consulted  and agree with results and recommendations: Patient; Physician; Nurse Pain: Pain Assessment Pain Assessment: 0-10 Pain Score: 0 Faces Pain Scale: 4 Breathing: 0 Negative Vocalization: 0 Facial Expression: 0 Body Language: 0 Consolability: 0 PAINAD Score: 0 Facial Expression: 0 Body Movements: 0 Muscle Tension: 0 Compliance with ventilator (intubated pts.): 0 Vocalization (extubated pts.): N/A CPOT Total: 0 Pain Location: R facial swelling and neck; R knee Pain Descriptors / Indicators: Grimacing Pain Intervention(s): Monitored during session End of Session: Start Time:SLP Start Time (ACUTE ONLY): 0934 Stop Time: SLP Stop Time (ACUTE ONLY): 0944 Time Calculation:SLP Time Calculation (min) (ACUTE ONLY): 10 min Charges: SLP Evaluations $ SLP Speech Visit: 1 Visit SLP Evaluations $BSS Swallow: 1 Procedure $ SLP EVAL LANGUAGE/SOUND PRODUCTION: 1 Procedure $Swallowing Treatment: 1 Procedure $Speech Treatment for Individual: 1 Procedure SLP visit diagnosis:  SLP Visit Diagnosis: Dysphagia, oropharyngeal phase (R13.12); Cognitive communication deficit (R41.841); Dysarthria and anarthria (R47.1) Past Medical History: Past Medical History: Diagnosis Date  Anemia of chronic disease 07/08/2006  Antral ulcer 02/23/2007  Seen on EGD in 2008, small ulcer with erosion   Cerebral vascular accident ([place]) 04/14/2006  1998, left lower extremity numbness, no residual deficits   Essential hypertension 04/14/2006  Gastroesophageal reflux disease 02/18/2013  Occasional, symptomatically relieved with [place] maintenance 12/04/2011  Hyperlipidemia LDL goal < 100 04/14/2006  Hypertensive retinopathy of both eyes, grade 1 05/15/2016  Osteopenia of right femoral neck 10/20/2016  DEXA (10/16/2016): R femur T -2.5 (FRAX tool calculates at -2.4), L1-L4 spine T -0.9, 10 year risk for: Major osteoporotic fracture 8.3%, Hip fracture 2.7%  Type 2 diabetes mellitus with stage 1 chronic kidney disease, without long-term current use of insulin (HCC)   Type II diabetes mellitus ([place]) 04/14/2006 Past Surgical History: Past Surgical History: Procedure Laterality Date  COLONOSCOPY    CRANIOTOMY Right 07/08/2022  Procedure: RIGHT CRANIOTOMY HEMATOMA EVACUATION SUBDURAL;  Surgeon: Earnie Larsson, MD;  Location: [facility];  Service: Neurosurgery;  Laterality: Right;  ESOPHAGOGASTRODUODENOSCOPY    ESOPHAGOGASTRODUODENOSCOPY (EGD) WITH PROPOFOL N/A 08/01/2022  Procedure: ESOPHAGOGASTRODUODENOSCOPY (EGD) WITH PROPOFOL;  Surgeon: Jesusita Oka, MD;  Location: [facility];  Service: General;  Laterality: N/A;  PEG PLACEMENT N/A 08/01/2022  Procedure: PERCUTANEOUS ENDOSCOPIC GASTROSTOMY (PEG) PLACEMENT;  Surgeon: Jesusita Oka, MD;  Location: [place] [facility] [facility];  Service: General;  Laterality: N/A; Bethany A Lutes 08/20/2022, 10:45 AM  No results for input(s): "WBC", "HGB", "HCT", "PLT" in the last 72 hours.   No results for input(s): "NA", "K", "CL", "CO2", "GLUCOSE", "BUN", "CREATININE", "CALCIUM" in the  last 72 hours.   No intake or output data in the 24 hours ending 08/21/22 1333         Physical Exam: Vital Signs Blood pressure (!) 149/71, pulse 76, temperature 97.9 F (36.6 C), resp. rate 16, height 5' 3"$  (1.6 m), weight 94 kg, SpO2 97 %.  Constitutional: No distress . Vital signs reviewed. Appears comfortable in bed HEENT: NCAT, lip swelling, L>R , appears dependent - stable  + white plaques on tongue and roof of mouth, with tongue swelling - improved swelling   Neck: supple, forward flexed. Has inflatable neck pillow which was readjusted.   Cardiovascular: RRR without murmur. No JVD    Respiratory/Chest: CTAB, No RRW, Normal effort.  GI/Abdomen: Normal active bowel sounds, soft, nontender +PEG, c/d/i Ext: no clubbing, cyanosis. LUE 1+ Edema - stable Psych: pleasant and cooperative  Skin: C/D/I. No apparent lesions.    Prior exam: + Sacral wound  - scabbed - improved  MSK:      1/2 fingerbreadth subluxation L shoulder - stable      Strength:                 LUE: 2/5 shoulder, 1+/5 elbow flexion, 1/5 elbow extension, 1/5 finger abduction, 1+/5 finger flexion - new 2/22!                LLE:  4-5/5  HF, KE, DF, PF   Neurologic exam:  Cognition: AAO to person, place, and time with options. Follows simple commands, reacts to verbal input. Improved alertness this AM.    Insight: Fair insight into current condition. Knows she had a stroke.   + LUE Hoffman's; +L facial droop. + L hemineglect, eyes can now pass midline. + L shoulder weakness--no change Spasticity:  none  Assessment/Plan: 1. Functional deficits which require 3+ hours per day of interdisciplinary therapy in a comprehensive inpatient rehab setting. Physiatrist is providing close team supervision and 24 hour management of active medical problems listed below. Physiatrist and rehab team continue to assess barriers to discharge/monitor patient progress toward functional and medical goals  Care Tool:  Bathing     Body parts bathed by patient: Face   Body parts bathed by helper: Left lower leg, Right lower leg, Left upper leg, Right upper leg, Buttocks, Front perineal area, Abdomen, Chest, Left arm, Right arm     Bathing assist Assist Level: Total Assistance - Patient < 25%     Upper Body Dressing/Undressing Upper body dressing   What is the patient wearing?: Pull over shirt    Upper body assist Assist Level: Total Assistance - Patient < 25%    Lower Body Dressing/Undressing Lower body dressing      What is the patient wearing?: Pants, Incontinence brief     Lower body assist Assist for lower body dressing: Dependent - Patient 0%     Toileting Toileting    Toileting assist Assist for toileting: Dependent - Patient 0%     Transfers Chair/bed transfer  Transfers assist  Chair/bed transfer activity did not occur: Safety/medical concerns (unable 2/2 pain, fatigue, weakness)  Chair/bed transfer assist level: 2 Helpers     Locomotion Ambulation   Ambulation assist   Ambulation activity did not occur: Safety/medical concerns (unable 2/2 pain, fatigue, weakness)          Walk 10 feet activity   Assist  Walk 10 feet activity did not occur: Safety/medical concerns        Walk 50 feet activity   Assist Walk 50 feet with 2 turns activity did not occur: Safety/medical concerns         Walk 150 feet activity   Assist Walk 150 feet activity did not occur: Safety/medical concerns         Walk 10 feet on uneven surface  activity   Assist Walk 10 feet on uneven surfaces activity did not occur: Safety/medical concerns         Wheelchair     Assist Is the patient using a wheelchair?: Yes Type of Wheelchair: Manual Wheelchair activity did not occur: Safety/medical concerns (unable 2/2 pain, fatigue, weakness)    Max wheelchair distance: 0    Wheelchair 50 feet with 2 turns activity    Assist    Wheelchair 50 feet with 2 turns activity  did not occur: Safety/medical concerns       Wheelchair 150 feet activity     Assist  Wheelchair 150 feet activity did not occur: Safety/medical  concerns       Blood pressure (!) 149/71, pulse 76, temperature 97.9 F (36.6 C), resp. rate 16, height 5' 3"$  (1.6 m), weight 94 kg, SpO2 97 %.    Medical Problem List and Plan: 1. Functional deficits secondary to subdural hematoma and ICH             - patient may shower with PEG site covered - ELOS/Goals: goal DC 08/27/22 , downgraded to Mod-Max Ax1 PT/OT/SLP             - Does well communicating with writing board and fingers for y/n             - Family requests change from DNR to full code on admission; changed   - Continue CIR therapies including PT, OT, and SLP  - 2/6 ordered L PRAFO, WHO, and soft cervical collar to help with head positioning OOB - 2/16: family meeting regarding Dispo plan, recommending full time skilled nursing care for significant deficits, family discussing [name] vs. Private care - now placement  2.  Antithrombotics: -DVT/anticoagulation:  Mechanical:  Antiembolism stockings, knee (TED hose) Bilateral lower extremities; add SCDs             -antiplatelet therapy: none - Duplex LUE - 2/8 superficial vein thrombosis involving the left cephalic vein-- warm compresses and conservative treatment   3. Pain Management: Tylenol and Robaxin 565m q6h as needed  - improved   - 2/6: lidocaine patch to L shoulder for pain - benefitting    - 2/9: voltaren gel 2g to L shoulder, b/l knees  - 2/15: schedule Tylenol 650 mg Q8H, gabapentin 300 mg TID Prn   - 2/16: schedule gabapentin 100 mg TID - improved  4. Mood/Behavior/Sleep: LCSW to evaluate and provide emotional support             -antipsychotic agents: n/a  -Trazodone PRN - 2/20: will start Fluoxetine 20 mg QHS for depressed mood and motor recovery  - 2/22: Affect/mood improved, new motor gains LUE!   5. Neuropsych/cognition: This patient is not capable of making  decisions on her own behalf.   6. Skin/Wound Care: Routine skin care checks - coccyx PI with scab  - routine care - LAL mattress - Dressings under PEG tube site to keep area dry, as appears moisture trapping under button 2/9 - improved - Nystatin PO QID for candida, will need suctioned after use 2/21   7. Fluids/Electrolytes/Nutrition: Routine Is and Os and follow-up chemistries             -NPO; has PEG             -continue Glucerna 1.5 continuous Tfs  - 2/9: Add potasium supplementation 20 meq BID; 2/11 dced - 2/12: Add 40 meq daily for K 3.4 ;Repeat in 2-3 days--not done, f/up on next labs 08/18/22 - stable 3.7  - Protein calorie malnutrition - on Juven BID, vitamins - Consult dietary to follow for titration - BG mildly elevated but in good range given age, monitor - 2/14: per dietary, cannot transition to bolus feeds d/t J tube. Looking into reducing hours of TF in daytime for ease of admin in home setting.  - 2/15: confirmed G tube with no J component; switching to bolus feeds. [facility]   - 08/16/22: well controlled, changed CBG checks and SSI to TID AC+QHS  -08/17/22 CBGs looking great, cont to monitor  2/19: elevated BG yesterday; monitor  1+ day for adjustments  2/20: MBS 2/21 scheduled; BG elevated, will start Metformin 500 mg QAM per tube  2/21: MBS results as above; starting PO trials with SLP!Marland Kitchen Monitor BG with initiation of metformin QD   Recent Labs    08/21/22 0553 08/21/22 0956 08/21/22 1204  GLUCAP 132* 180* 201*       8: Hypertension: monitor TID and prn             -continue amlodipine 10 mg daily             -losartan 50 mg daily -> Dced 2/3 d/t concern for angioedema               - Add hydralazine 10 mg q8h PRN for SBP >180, DBP>100 -2/4: BP stable/high; mild tachycardia this AM; if AM labs do not show dehydration, would start HCTZ 12.5 mg daily for adjunctive control -2/5 BP with improvement over last 12 hours--will hold  off on adding any further medication right now.  -2/6 - mild HTN, no med changes, labs in AM  -2/7 -   HTN remains mild; start hctz 6.325 mg per tube daily; labs in AM 2/9  -2/9: DC HCTZ, switch to Coreg 3.125 BID, DC K supplement  -2/12: Monitor, if consistently HTN can increase coreg  -2/13: increase to 6.5 mg BID  - 08/17/22 BP/HR well controlled, cont regimen (amlodipine 46m QD, Coreg 6.231mBID, Hydralazine 1019m8h PRN)  - 2/22: Mild BP increase; monitor 1 day before changing medications Vitals:   08/17/22 1947 08/17/22 1948 08/18/22 0309 08/18/22 1307  BP: 136/86 136/86 128/78 (!) 144/74   08/18/22 1917 08/19/22 0255 08/19/22 1307 08/19/22 2017  BP: (!) 132/53 139/65 124/77 124/63   08/20/22 0432 08/20/22 1307 08/20/22 1940 08/21/22 0455  BP: (!) 148/76 129/70 (!) 148/67 (!) 149/71     9: Hyperlipidemia: continue Lipitor 102m47m, Zetia 10mg62m  10: SDH/ICH s/p crani: Finished Keppra for sz ppx.    11: Leukocytosis: current temperature 99.3, ceftazidime completed 1/29 - resolved -Follow-up CBC with differential, mild WBC increase 2.2 likely d/t POD#1; downtrending 2/3 - Fever overnight prior to admission; POD from PEG, monitor for recurrence  - No fever recurrence 2/3; monitor - 2/6: Labs in AM. CXR today for course lung sounds, and duplex for LUE swelling. Strict oral care QID    - 2/7: AM labs with mild subsegmental atelectasis on R; encourage ISB  -2/10 WBC 7.0 on 2/7, no signs of infection  -08/11/22 WBC 6.4, cont to monitor on weekly labs, next 08/18/22 - WNL  12: Anemia, multifactorial:  Stable HgB 8-9             -Follow-up CBC -hgb with sl decrease again today, normocytic  -check stool for OB  -PA added b-supp. Add Fe++ too  - 2/9: transfuse 1 unit for HgB 6.4 today. FOBT pending.   -2/10 FOBT pos, recheck, recheck H&H tomorrow, consider GI consult -2/11 H/H stable, spoke with GI, no intervention at this time supportive care, GI ordered BMP,  re consult if HGB  continues to decrease, appreciate assistance   -2/12: HgB stable, monitor on weekly labs next 08/18/22   13. ?Angioedema vs. Chlorhexadine reaction - improving, appears no dependent - Improving per family after DC chlorhexidine skin wipes and oral mouthwash              - Per SLP notes, interferes with tongue movement, speaking - Hx cough with Enalipril, Losartan increased from  OP 25 mg to 50 mg 1/14, never weaned; edema started 1/30 per chart review - d/w pharmacy 2/4 and agree that reaction can occur at any time, and may cross-react with ARB. Will attempt to titrate BP meds without ACE/ARBs and monitor swelling.   - reposition head in bed to avoid dependent edema  14. L hemineglect - causing R gaze preference and positional tone             - Repositioning in bed to straighten head--head more neutral today 2/5 - May need to consider botox given need to recover cognitive function and relatively little tone elsewhere  - 2/6: E stim and taping to L shoulder for mild sublux per OT.   15. Urinary incontinence - ongoing  - Foley removed 2/6  - PVRs QID and ISC for >350 ccs retention - 2/7: 1x PVR low; monitor; ? Incontinence vs mixed, will consider timed toiletting after more information gathered  - 2/12: Start timed toiletting Q4H for urgency  16. Dysphagia, s/p PEG placement.   - Remains NPO  - Strict oral care QID  - CXR  given course breath sounds, weak cough, poor secretion clearance - R subsegmental atelectasis, encourage ISB - See above  17. Diarrhea - stable/improving - loose stools likely d/t tube feeds; on fibercon; will add immodium for >2 diarrhea BM in 24 hours   - 2/15: daily Bms now, improved  -08/16/22 BM x2 today, cont to monitor.   -08/17/22 1 BM this morning during visit, but overall improving, monitor  - 2/21: KUB with moderate stool burden; DC fibercon, start QD miralax 17 g   - 2/22: 2x smears for BM; increase to miralax BID  18. Bibasilar crackles. CXR  IMPRESSION: 1. Mildly decreased lung volumes, although improved from most recent 08/05/2022 radiograph. 2. Right basilar subsegmental atelectasis. No definite focal airspace opacity to indicate pneumonia.  - ISB as tolerated; no other interventions at this time    LOS: 19 days A FACE TO [place] 08/21/2022, 1:33 PM

## 2022-08-21 NOTE — Progress Notes (Signed)
Physical Therapy Session Note  Patient Details  Name: Gina Kelley MRN: [id number] Date of Birth: 10-May-1936  Today's Date: 08/21/2022 PT Individual Time: 1st Treatment Session: 1130-1200; 2nd Treatment Session: [id number] PT Individual Time Calculation (min): 30 min; 45 min  Short Term Goals: Week 3:  PT Short Term Goal 1 (Week 3): Patient will perform bed/chair transfer with LRAD and ModA PT Short Term Goal 2 (Week 3): Patient will maintain dynamic sitting balance with CGA PT Short Term Goal 3 (Week 3): Patient will initiate sit/stand with LRAD  Skilled Therapeutic Interventions/Progress Updates: 1st Treatment Session-  Patient greeted sitting upright in TIS wheelchair and agreeable to PT treatment session. When asked if patient was ready to go to the gym, she shook her head "no." When asked if she needed to be cleaned up and if her brief was soiled, patient said "yes." This is the first time patient has been able to communicate the need to be cleaned up prior to going to the gym. Patient performed squat pivot transfer from TIS wheelchair to sitting EOB with MaxA, however patient was able to initiate an anterior weight shift in preparation for transfer. Patient transitioned from sitting EOB to supine with MaxA for trunk and B LE management with VC for transitioning onto her R forearm. Once supine, patient rolled L and R with Mod/MaxA and HOH assistance for locating bed rail with R UE. While in sidelying therapist and rehab tech performed pericare and donned new brief. Patient left supine in bed with HOB elevated, call bell within reach L UE supported on pillow and all needs met.  2nd Treatment Session-  Patient initially greeted supine in bed and politely refused PT treatment session secondary to fatigue, however therapist returned an hour later and patient was agreeable to performing stretching while supine in the bed but not agreeable to going to the rehab gym at this time. Therapist  performed PROM sustained stretching in order to improve flexibility for bed mobility and transfers-  B hip adductors B DF/PF B hamstrings  B hip internal/external rotation B knee to chest PROM, x10 each LE R upper trapezius stretch  R levator scapulae stretch  B anterior neck flexors stretch   Patient reported being soiled of urine and rolled multiple times as stated in earlier session in order for therapist and NT to perform pericare and don new brief.    Therapist positioned patient rotated onto her L side with pillows propped under her R side in order to encourage L head rotation to improve overall flexibility and decrease pain. Patient left with call bell within reach and all needs met.   Therapy Documentation Precautions:  Precautions Precautions: Fall Precaution Comments: peg tube. HOB no lower than 30*. Left dense hemi. right gaze preference, left side inattention Required Braces or Orthoses: Other Brace Other Brace: PRAFO boots on in bed Restrictions Weight Bearing Restrictions: No   Therapy/Group: Individual Therapy  Adir Schicker 08/21/2022, 7:53 AM

## 2022-08-21 NOTE — Progress Notes (Signed)
Occupational Therapy Session Note  Patient Details  Name: Gina Kelley MRN: [id number] Date of Birth: 02-18-1936  Today's Date: 08/21/2022 OT Individual Time: [id number] OT Individual Time Calculation (min): 72 min    Short Term Goals: Week 3:  OT Short Term Goal 1 (Week 3): LTG=stg 2/2 ELOS  Skilled Therapeutic Interventions/Progress Updates:    Pt greeted semi-reclined in bed with nurse tech about to change pt but handoff to OT. Rolling in bed with max +2 for total A peri-care and brief change from urinary incontinence. Pt brought into gravity eliminated sidelying position for L UE NMR. Pt needed mod/max cues to get pt to attend to her L arm, but once she did, she was able to follow commands to initiate some elbow flex/ext, shoulder flex/ext, and even a flicker of some wrist and hand activation! Unable to get pt to use L UE functionally yet. Pt needed max A to sit up at EOB. Sitting balance at overall close supervision/min A. Max A to thread pants at EOB, then Max of 1 for stand while +2 assisted with pulling up pants. Pt took seated rest break, then max A stand-pivot to TIS wc. Worked on L attention and visual scanning at the sink as well as use of L UE as a stabilizer. OT brough L UE to the sink and worked on hand washing task and focus on integrating L UE functionally. Pt issued squueze foam to start trying to initiate grasp, trace activation in fingers! Pt left seated in TIS wc with seat belt on, call bell, and needs met.   Therapy Documentation Precautions:  Precautions Precautions: Fall Precaution Comments: peg tube. HOB no lower than 30*. Left dense hemi. right gaze preference, left side inattention Required Braces or Orthoses: Other Brace Other Brace: PRAFO boots on in bed Restrictions Weight Bearing Restrictions: No Pain:  Denies pain    Therapy/Group: Individual Therapy  Valma Cava 08/21/2022, 8:56 AM

## 2022-08-22 DIAGNOSIS — R4182 Altered mental status, unspecified: Secondary | ICD-10-CM | POA: Diagnosis not present

## 2022-08-22 DIAGNOSIS — L89312 Pressure ulcer of right buttock, stage 2: Secondary | ICD-10-CM | POA: Diagnosis not present

## 2022-08-22 DIAGNOSIS — F419 Anxiety disorder, unspecified: Secondary | ICD-10-CM | POA: Diagnosis not present

## 2022-08-22 DIAGNOSIS — R634 Abnormal weight loss: Secondary | ICD-10-CM | POA: Diagnosis not present

## 2022-08-22 DIAGNOSIS — R509 Fever, unspecified: Secondary | ICD-10-CM | POA: Diagnosis not present

## 2022-08-22 DIAGNOSIS — R051 Acute cough: Secondary | ICD-10-CM | POA: Diagnosis not present

## 2022-08-22 DIAGNOSIS — Z7401 Bed confinement status: Secondary | ICD-10-CM | POA: Diagnosis not present

## 2022-08-22 DIAGNOSIS — S065XAD Traumatic subdural hemorrhage with loss of consciousness status unknown, subsequent encounter: Secondary | ICD-10-CM | POA: Diagnosis not present

## 2022-08-22 DIAGNOSIS — E46 Unspecified protein-calorie malnutrition: Secondary | ICD-10-CM | POA: Diagnosis not present

## 2022-08-22 DIAGNOSIS — E1122 Type 2 diabetes mellitus with diabetic chronic kidney disease: Secondary | ICD-10-CM | POA: Diagnosis not present

## 2022-08-22 DIAGNOSIS — I69891 Dysphagia following other cerebrovascular disease: Secondary | ICD-10-CM | POA: Diagnosis not present

## 2022-08-22 DIAGNOSIS — R059 Cough, unspecified: Secondary | ICD-10-CM | POA: Diagnosis not present

## 2022-08-22 DIAGNOSIS — F331 Major depressive disorder, recurrent, moderate: Secondary | ICD-10-CM | POA: Diagnosis not present

## 2022-08-22 DIAGNOSIS — F0631 Mood disorder due to known physiological condition with depressive features: Secondary | ICD-10-CM | POA: Diagnosis not present

## 2022-08-22 DIAGNOSIS — E559 Vitamin D deficiency, unspecified: Secondary | ICD-10-CM | POA: Diagnosis not present

## 2022-08-22 DIAGNOSIS — I10 Essential (primary) hypertension: Secondary | ICD-10-CM | POA: Diagnosis not present

## 2022-08-22 DIAGNOSIS — R1312 Dysphagia, oropharyngeal phase: Secondary | ICD-10-CM | POA: Diagnosis not present

## 2022-08-22 DIAGNOSIS — R5383 Other fatigue: Secondary | ICD-10-CM | POA: Diagnosis not present

## 2022-08-22 DIAGNOSIS — I69354 Hemiplegia and hemiparesis following cerebral infarction affecting left non-dominant side: Secondary | ICD-10-CM | POA: Diagnosis not present

## 2022-08-22 DIAGNOSIS — E119 Type 2 diabetes mellitus without complications: Secondary | ICD-10-CM | POA: Diagnosis not present

## 2022-08-22 DIAGNOSIS — L22 Diaper dermatitis: Secondary | ICD-10-CM | POA: Diagnosis not present

## 2022-08-22 DIAGNOSIS — R2689 Other abnormalities of gait and mobility: Secondary | ICD-10-CM | POA: Diagnosis not present

## 2022-08-22 DIAGNOSIS — E785 Hyperlipidemia, unspecified: Secondary | ICD-10-CM | POA: Diagnosis not present

## 2022-08-22 DIAGNOSIS — J069 Acute upper respiratory infection, unspecified: Secondary | ICD-10-CM | POA: Diagnosis not present

## 2022-08-22 DIAGNOSIS — M25561 Pain in right knee: Secondary | ICD-10-CM | POA: Diagnosis not present

## 2022-08-22 DIAGNOSIS — R531 Weakness: Secondary | ICD-10-CM | POA: Diagnosis not present

## 2022-08-22 DIAGNOSIS — I619 Nontraumatic intracerebral hemorrhage, unspecified: Secondary | ICD-10-CM | POA: Diagnosis not present

## 2022-08-22 DIAGNOSIS — R471 Dysarthria and anarthria: Secondary | ICD-10-CM | POA: Diagnosis not present

## 2022-08-22 DIAGNOSIS — F339 Major depressive disorder, recurrent, unspecified: Secondary | ICD-10-CM | POA: Diagnosis not present

## 2022-08-22 DIAGNOSIS — S065XAA Traumatic subdural hemorrhage with loss of consciousness status unknown, initial encounter: Secondary | ICD-10-CM | POA: Diagnosis not present

## 2022-08-22 DIAGNOSIS — R5381 Other malaise: Secondary | ICD-10-CM | POA: Diagnosis not present

## 2022-08-22 DIAGNOSIS — R41841 Cognitive communication deficit: Secondary | ICD-10-CM | POA: Diagnosis not present

## 2022-08-22 DIAGNOSIS — D509 Iron deficiency anemia, unspecified: Secondary | ICD-10-CM | POA: Diagnosis not present

## 2022-08-22 DIAGNOSIS — M6281 Muscle weakness (generalized): Secondary | ICD-10-CM | POA: Diagnosis not present

## 2022-08-22 DIAGNOSIS — E441 Mild protein-calorie malnutrition: Secondary | ICD-10-CM | POA: Diagnosis not present

## 2022-08-22 LAB — GLUCOSE, CAPILLARY
Glucose-Capillary: 140 mg/dL — ABNORMAL HIGH (ref 70–99)
Glucose-Capillary: 157 mg/dL — ABNORMAL HIGH (ref 70–99)
Glucose-Capillary: 169 mg/dL — ABNORMAL HIGH (ref 70–99)
Glucose-Capillary: 98 mg/dL (ref 70–99)

## 2022-08-22 MED ORDER — FAMOTIDINE 20 MG PO TABS: 20.0000 mg | ORAL_TABLET | Freq: Two times a day (BID) | ORAL | Status: DC

## 2022-08-22 MED ORDER — GLUCERNA 1.5 CAL PO LIQD: 237.0000 mL | Freq: Every day | ORAL | Status: DC

## 2022-08-22 MED ORDER — AMLODIPINE BESYLATE 10 MG PO TABS: 10.0000 mg | ORAL_TABLET | Freq: Every day | ORAL | Status: DC

## 2022-08-22 MED ORDER — CARVEDILOL 6.25 MG PO TABS: 6.2500 mg | ORAL_TABLET | Freq: Two times a day (BID) | ORAL | Status: AC

## 2022-08-22 MED ORDER — L-METHYLFOLATE-B6-B12 3-35-2 MG PO TABS: 1.0000 | ORAL_TABLET | Freq: Every day | ORAL | Status: DC

## 2022-08-22 MED ORDER — FERROUS SULFATE 300 (60 FE) MG/5ML PO SOLN: 300.0000 mg | Freq: Every day | ORAL | 3 refills | Status: DC

## 2022-08-22 MED ORDER — INSULIN ASPART 100 UNIT/ML IJ SOLN: 0.0000 [IU] | Freq: Three times a day (TID) | INTRAMUSCULAR | 11 refills | Status: DC

## 2022-08-22 MED ORDER — POLYVINYL ALCOHOL 1.4 % OP SOLN: 1.0000 [drp] | OPHTHALMIC | 0 refills | Status: DC | PRN

## 2022-08-22 MED ORDER — EZETIMIBE 10 MG PO TABS: 10.0000 mg | ORAL_TABLET | Freq: Every day | ORAL | Status: AC

## 2022-08-22 MED ORDER — JUVEN PO PACK: 1.0000 | PACK | Freq: Two times a day (BID) | ORAL | 0 refills | Status: DC

## 2022-08-22 MED ORDER — DICLOFENAC SODIUM 1 % EX GEL: 2.0000 g | Freq: Four times a day (QID) | CUTANEOUS | Status: DC

## 2022-08-22 MED ORDER — NYSTATIN 100000 UNIT/ML MT SUSP: 5.0000 mL | Freq: Four times a day (QID) | OROMUCOSAL | 0 refills | Status: DC

## 2022-08-22 MED ORDER — METFORMIN HCL 500 MG PO TABS: 500.0000 mg | ORAL_TABLET | Freq: Two times a day (BID) | ORAL | Status: DC

## 2022-08-22 MED ORDER — ACETAMINOPHEN 160 MG/5ML PO SOLN: 650.0000 mg | Freq: Three times a day (TID) | ORAL | 0 refills | Status: DC

## 2022-08-22 MED ORDER — POLYETHYLENE GLYCOL 3350 17 G PO PACK: 17.0000 g | PACK | Freq: Every day | ORAL | 0 refills | Status: DC | PRN

## 2022-08-22 MED ORDER — INSULIN ASPART 100 UNIT/ML IJ SOLN: 0.0000 [IU] | Freq: Every day | INTRAMUSCULAR | 11 refills | Status: DC

## 2022-08-22 MED ORDER — GABAPENTIN 250 MG/5ML PO SOLN: 100.0000 mg | Freq: Three times a day (TID) | ORAL | 12 refills | Status: DC

## 2022-08-22 MED ORDER — ASCORBIC ACID 500 MG PO TABS: 500.0000 mg | ORAL_TABLET | Freq: Every day | ORAL | Status: DC

## 2022-08-22 MED ORDER — LIDOCAINE 5 % EX PTCH: 1.0000 | MEDICATED_PATCH | CUTANEOUS | 0 refills | Status: DC

## 2022-08-22 MED ORDER — FLUOXETINE HCL 20 MG PO CAPS: 20.0000 mg | ORAL_CAPSULE | Freq: Every day | ORAL | 3 refills | Status: AC

## 2022-08-22 MED ORDER — FREE WATER: 120.0000 mL | Freq: Every day | Status: DC

## 2022-08-22 MED ORDER — ATORVASTATIN CALCIUM 80 MG PO TABS: 80.0000 mg | ORAL_TABLET | Freq: Every day | ORAL | Status: DC

## 2022-08-22 NOTE — Plan of Care (Signed)
  Problem: RH Balance Goal: LTG: Patient will maintain dynamic sitting balance (OT) Description: LTG:  Patient will maintain dynamic sitting balance with assistance during activities of daily living (OT) Outcome: Not Met (add Reason) Note: Pt still requires min A for dynamic sitting balance.    Problem: RH Grooming Goal: LTG Patient will perform grooming w/assist,cues/equip (OT) Description: LTG: Patient will perform grooming with assist, with/without cues using equipment (OT) Outcome: Completed/Met   Problem: RH Bathing Goal: LTG Patient will bathe all body parts with assist levels (OT) Description: LTG: Patient will bathe all body parts with assist levels (OT) Outcome: Completed/Met   Problem: RH Dressing Goal: LTG Patient will perform upper body dressing (OT) Description: LTG Patient will perform upper body dressing with assist, with/without cues (OT). Outcome: Completed/Met Goal: LTG Patient will perform lower body dressing w/assist (OT) Description: LTG: Patient will perform lower body dressing with assist, with/without cues in positioning using equipment (OT) Outcome: Not Applicable Note: Goal d/c'd on 2/20_ESD   Problem: RH Functional Use of Upper Extremity Goal: LTG Patient will use RT/LT upper extremity as a (OT) Description: LTG: Patient will use right/left upper extremity as a stabilizer/gross assist/diminished/nondominant/dominant level with assist, with/without cues during functional activity (OT) Outcome: Completed/Met

## 2022-08-22 NOTE — Progress Notes (Signed)
Patient discharged via PT[facility] to [name]. Family present; family gathered all belonging. Discharge packet given to PTAR-Staff.    Yehuda Mao, LPN

## 2022-08-22 NOTE — Progress Notes (Signed)
Physical Therapy Discharge Summary  Patient Details  Name: Gina Kelley MRN: [id number] Date of Birth: 10-12-1935  Date of Discharge from PT service:August 22, 2022   Patient has met 1 of 4 long term goals due to improved activity tolerance, improved balance, improved postural control, increased strength, ability to compensate for deficits, functional use of  left lower extremity, improved attention, improved awareness, and improved coordination.  Patient to discharge at a wheelchair level Max Assist.   Patient is discharging to a skilled nursing facility for continued care.   Reasons goals not met: Patient continues to require MaxA for transfers and wheelchair mobility at this time.   Recommendation:  Patient will benefit from ongoing skilled PT services in skilled nursing facility setting to continue to advance safe functional mobility, address ongoing impairments in dynamic/static sitting balance, L inattention/awareness, bed mobility, transfers, activity tolerance, midline posturing and minimize fall risk.  Equipment: No equipment provided as patient is transitioning her care to a skilled nursing facility.   Reasons for discharge: treatment goals met and discharge from hospital  Patient/family agrees with progress made and goals achieved: Yes  PT Discharge Precautions/Restrictions Precautions Precautions: Fall Other Brace: PRAFO boots on in bed Restrictions Weight Bearing Restrictions: No Pain Interference Pain Interference Pain Effect on Sleep: 1. Rarely or not at all Pain Interference with Therapy Activities: 1. Rarely or not at all Pain Interference with Day-to-Day Activities: 1. Rarely or not at all Vision/Perception  Vision - History Ability to See in Adequate Light: 1 Impaired Perception Perception: Impaired Inattention/Neglect: Does not attend to left visual field;Does not attend to left side of body Spatial Orientation: impaired Praxis Praxis:  Impaired Praxis Impairment Details: Motor planning  Cognition Overall Cognitive Status: Impaired/Different from baseline Arousal/Alertness: Awake/alert Orientation Level: Oriented to person;Oriented to place;Disoriented to situation;Disoriented to time Year: 2022 Month: February Day of Week: Incorrect Attention: Sustained Sustained Attention: Impaired Sustained Attention Impairment: Verbal basic;Functional basic Memory: Impaired Memory Impairment: Retrieval deficit;Decreased recall of new information;Decreased short term memory Decreased Short Term Memory: Verbal basic;Functional basic Awareness: Impaired Awareness Impairment: Intellectual impairment Problem Solving: Impaired Problem Solving Impairment: Functional basic;Verbal basic Executive Function:  (all impaired) Behaviors: Perseveration Safety/Judgment: Impaired Comments: left inattention Sensation Sensation Light Touch: Impaired by gross assessment Proprioception: Impaired by gross assessment Coordination Gross Motor Movements are Fluid and Coordinated: No Fine Motor Movements are Fluid and Coordinated: No Coordination and Movement Description: grossly uncoordinated 2/2 L hemi Motor  Motor Motor: Hemiplegia;Abnormal postural alignment and control Motor - Discharge Observations: L hemiplegia  Mobility Bed Mobility Bed Mobility: Rolling Right;Rolling Left;Sit to Supine;Supine to Sit Rolling Right: Maximal Assistance - Patient 25-49% Rolling Left: Minimal Assistance - Patient > 75% Supine to Sit: Moderate Assistance - Patient 50-74% Sit to Supine: Moderate Assistance - Patient 50-74% Transfers Transfers: Pharmacist, hospital Pivot Transfers: Maximal Assistance - Patient 25-49% Locomotion  Gait Ambulation: No Gait Gait: No Stairs / Additional Locomotion Stairs: No Wheelchair Mobility Wheelchair Mobility: No  Trunk/Postural Assessment  Cervical Assessment Cervical Assessment: Exceptions to [facility] (Right  rotation with flexion bias) Postural Control Postural Control: Deficits on evaluation  Balance Balance Balance Assessed: Yes Static Sitting Balance Static Sitting - Balance Support: Feet supported;No upper extremity supported Static Sitting - Level of Assistance: 4: Min assist;5: Stand by assistance Dynamic Sitting Balance Dynamic Sitting - Balance Support: Feet supported;During functional activity Dynamic Sitting - Level of Assistance: 4: Min assist;3: Mod assist Static Standing Balance Static Standing - Balance Support: Right upper extremity supported;During functional activity  Static Standing - Level of Assistance: 1: +1 Total assist Extremity Assessment  RUE Assessment RUE Assessment: Within Functional Limits LUE Assessment LUE Assessment: Exceptions to [facility] General Strength Comments: shoulder and elbow activation, starting to get some flicker in hand LUE Body System: Neuro Brunstrum levels for arm and hand: Arm;Hand Brunstrum level for arm: Stage II Synergy is developing Brunstrum level for hand: Stage I Flaccidity;Stage II Synergy is developing RLE Assessment RLE Assessment: Exceptions to [facility] General Strength Comments: Grossly 3/5, however poor motor planning noted LLE Assessment LLE Assessment: Exceptions to [facility] General Strength Comments: Grossly 3/5, however poor motor planning and coordination noted   Dominica  Marx Doig 08/22/2022, 3:31 PM

## 2022-08-22 NOTE — Progress Notes (Addendum)
Patient ID: Gina Kelley, female   DOB: 1935-10-31, 87 y.o.   MRN: [id number]  SW returned phone call to [name] to inform [name] auth submitted and waiting on insurance to provide an answer. She is aware SW will follow-up once there is more information.   1211- SW received updates from [employer] with [employer] reporting that Josem Kaufmann was provided. SW informed will call  1213- SW confirmed with Walter/[employer] Medicare rep ([phone]/[phone]/ref [id number]) auth approved for 5 days start date 5/23- 5/27; Josem Kaufmann [id number].  SW informed medical team. Attending agreeable to pt to discharge today. PTAR scheduled for pick up at 4pm. SW updated Kathy/liaison with [employer] to inform on transition.    SW spoke with [name] to inform on above. She will come up to get her belongings.   Loralee Pacas, MSW, [place] Office: [phone] Cell: [phone] Fax: [phone]

## 2022-08-22 NOTE — Progress Notes (Signed)
Speech Language Pathology Discharge Summary  Patient Details  Name: Gina Kelley MRN: [id number] Date of Birth: 29-Jun-1936  Date of Discharge from SLP service:August 22, 2022  Today's Date: 08/22/2022 SLP Individual Time: 1400-1459 SLP Individual Time Calculation (min): 59 min   Skilled Therapeutic Interventions:   Pt seen this date for skilled ST intervention targeting swallowing and speech goals outlined in care plan. Pt received awake/alert and sitting upright in bed, recently finished with PT. Agreeable to intervention at bedside. Preparing to d/c to [name] this afternoon. Pleasant and participatory throughout.   Today's session with emphasis on therapeutic PO trials of puree and honey-thick liquids via tsp and re-assessment of speech and cognitive-linguistic skills in preparation for d/c to [name]. Oral care completed via suction toothbrush with Total A for thoroughness. Moderate-severe oral deficits remain with both consistencies provided; however, no overt s/sx concerning for airway intrusion. Pt benefited from SLP providing slow rate of feeding and providing liquids via spoon and providing intermittent oral suctioning during trials to mitigate and remove oral residuals (pt unable to with prompted double swallows); attempted honey-thick liquids via cup - though, when asked, pt states she prefers liquids be provided via spoon. Recommend continuation of NPO with AMON. May consider initiation of oral diet with close monitoring from SLP at next venue of care.   Re: speech - overall speech intelligibility remains severely limited by severe dysarthria and oral motor deficits. At the word level and within a known context, pt is ~75-80% intelligible with decreased intelligibility with pt's attempts at longer utterances lengths. Have trialed/attempted use of low-tech AAC systems with minimal to no stimulability due to cognitive and visual deficits. Does best when given yes/no and close-ended  questions, verbal choice prompts, and extended processing time to response. Cognitively, pt demonstrated impairment in all five domains (severe), as well as deficits in confrontation and divergent naming. Verbal communication is further compounded by decreased initiation. Recommend ongoing ST intervention at next venue of care to address aforementioned deficits.  Pt left in bed and with all safety measures activated, call bell within reach, and all immediate needs met. Please see below for discharge summary.   Patient has met 5 of 7 long term goals.  Patient to discharge at overall Mod;Max level.  Reasons goals not met: Pt discharging sooner than anticipated   Clinical Impression/Discharge Summary:    During this CIR admission, pt has demonstrated subtle improvement, meeting 5 out of 7 long-term goals - other goals not met due to severity of deficits. Pt remains limited by cognitive-linguistic deficits, severe dysarthria, and moderate-severe oral + mild pharyngeal dysphagia. Currently, pt is NPO with G-tube. MBSS completed on 2/21/204 with recommendations to continue NPO status and target PO trials of puree and honey-thick liquids via tsp (due to pt preference and severity of oral deficits) with SLP only. Pt has tolerated more aggressive trials of puree and honey-thick liquids via tsp during treatment sessions since MBSS; therefore, recommend considering initiation of PO diet with strict aspiration precautions and use of oral suctioning intermittently during meal to reduce and clear oral residuals. Pt to d/c to [name] this date; recommend continuation of ST intervention at this venue. Thank you for allowing [place] to care for Gina Kelley.   Care Partner:  Caregiver Able to Provide Assistance: Yes  Type of Caregiver Assistance: Physical;Cognitive  Recommendation:  Skilled Nursing facility  Rationale for SLP Follow Up: Maximize functional communication;Maximize swallowing safety;Maximize cognitive function  and independence;Reduce caregiver burden   Equipment:  Reasons for discharge: Discharged from hospital   Patient/Family Agrees with Progress Made and Goals Achieved: Yes    Gina Kelley 08/22/2022, 3:11 PM

## 2022-08-22 NOTE — Progress Notes (Signed)
Physical Therapy Session Note  Patient Details  Name: Gina Kelley MRN: [id number] Date of Birth: 1936-05-03  Today's Date: 08/22/2022 PT Individual Time: 1300-1400 PT Individual Time Calculation (min): 60 min   Short Term Goals: Week 3:  PT Short Term Goal 1 (Week 3): Patient will perform bed/chair transfer with LRAD and ModA PT Short Term Goal 2 (Week 3): Patient will maintain dynamic sitting balance with CGA PT Short Term Goal 3 (Week 3): Patient will initiate sit/stand with LRAD  Skilled Therapeutic Interventions/Progress Updates:  Patient greeted supine in bed and agreeable to PT treatment session, however reported needed to use the restroom. Patient rolled L with MinA in order for therapist to place bedpan- Patient with continent BM and void with RN and NT notified and aware. Patient was again able to roll to the L with [facility] guiding assistance for locating bed rail and MinA for rolling completely on her side. While in sidelying, therapist performed pericare and donned clean brief and pants. Patient was able to transition from supine to sitting EOB with ModA- Patient initiated placing B LE off the bed and then required assistance for righting trunk. While sitting EOB, patient required multimodal cues for improved anterior weight shift for improved sitting balance with good improvements noted and only SBA required. Patient performed squat pivot transfer to/from EOB and TIS wheelchair with MaxA- Patient does demonstrated pushing through B LE throughout transfer. Once seated in the wheelchair, patient went around the fourth floor on her "farewell tour" saying goodbye to various therapists and staff- Appropriate communication, alertness and visual tracking to the L noted. Patient returned to her room and transferred back to bed same as above. Patient was then able to transition from sitting EOB to supine with [place]- Therapist assisting with trunk and L LE management. Patient left supine in bed with  call bell within reach, L UE and B ankles supported with a pillow and all needs met.    Therapy Documentation Precautions:  Precautions Precautions: Fall Precaution Comments: peg tube. HOB no lower than 30*. Left dense hemi. right gaze preference, left side inattention Required Braces or Orthoses: Other Brace Other Brace: PRAFO boots on in bed Restrictions Weight Bearing Restrictions: No  Therapy/Group: Individual Therapy  Kaidan Harpster 08/22/2022, 7:49 AM

## 2022-08-22 NOTE — Progress Notes (Signed)
PROGRESS NOTE   Subjective/Complaints:  No events overnight. No acute complaints.  No pain in left upper extremity this a.m. some mildly decreased effort in left upper extremity movement compared to yesterday, but overall still improved.  Per social work, has placement, may discharge today.  ROS: limited due to language/communication +L shoulder pain - resolved.   Objective:   No results found. No results for input(s): "WBC", "HGB", "HCT", "PLT" in the last 72 hours.   No results for input(s): "NA", "K", "CL", "CO2", "GLUCOSE", "BUN", "CREATININE", "CALCIUM" in the last 72 hours.   No intake or output data in the 24 hours ending 08/22/22 1311         Physical Exam: Vital Signs Blood pressure 136/70, pulse 80, temperature 98.2 F (36.8 C), resp. rate 14, height '5\' 3"'$  (1.6 m), weight 77 kg, SpO2 99 %.  Constitutional: No distress . Vital signs reviewed. Appears comfortable in bed HEENT: NCAT, lip swelling, L>R , appears dependent - stable  + white plaques on tongue and roof of mouth, with tongue swelling - improved swelling and plaques   Neck: supple, forward flexed. Has inflatable neck pillow which was readjusted.   Cardiovascular: RRR without murmur. No JVD    Respiratory/Chest: CTAB, No RRW, Normal effort.  GI/Abdomen: Normal active bowel sounds, soft, nontender +PEG, c/d/i Ext: no clubbing, cyanosis. LUE 1+ Edema - stable Psych: pleasant and cooperative  Skin: C/D/I. No apparent lesions.    Prior exam: + Sacral wound  - scabbed - improved  MSK:      1/2 fingerbreadth subluxation L shoulder - stable      Strength:                 LUE: 2/5 shoulder, 1/5 elbow flexion, 2-/5 elbow extension, 1/5 finger abduction, 1/5 finger flexion - new 2/22-stable                LLE:  4-5/5  HF, KE, DF, PF   Neurologic exam:  Cognition: AAO to person, place, and time with options. Follows simple commands, reacts to verbal  input. Improved alertness this AM. -Ongoing   Insight: Fair insight into current condition. Knows she had a stroke.   + LUE Hoffman's; +L facial droop. + L hemineglect, eyes can now pass midline. + L shoulder weakness--no change Spasticity:  none  Assessment/Plan: 1. Functional deficits which require 3+ hours per day of interdisciplinary therapy in a comprehensive inpatient rehab setting. Physiatrist is providing close team supervision and 24 hour management of active medical problems listed below. Physiatrist and rehab team continue to assess barriers to discharge/monitor patient progress toward functional and medical goals  Care Tool:  Bathing    Body parts bathed by patient: Face   Body parts bathed by helper: Left lower leg, Right lower leg, Left upper leg, Right upper leg, Buttocks, Front perineal area, Abdomen, Chest, Left arm, Right arm     Bathing assist Assist Level: Total Assistance - Patient < 25%     Upper Body Dressing/Undressing Upper body dressing   What is the patient wearing?: Pull over shirt    Upper body assist Assist Level: Total Assistance - Patient < 25%  Lower Body Dressing/Undressing Lower body dressing      What is the patient wearing?: Pants, Incontinence brief     Lower body assist Assist for lower body dressing: Dependent - Patient 0%     Toileting Toileting    Toileting assist Assist for toileting: Dependent - Patient 0%     Transfers Chair/bed transfer  Transfers assist  Chair/bed transfer activity did not occur: Safety/medical concerns (unable 2/2 pain, fatigue, weakness)  Chair/bed transfer assist level: 2 Helpers     Locomotion Ambulation   Ambulation assist   Ambulation activity did not occur: Safety/medical concerns (unable 2/2 pain, fatigue, weakness)          Walk 10 feet activity   Assist  Walk 10 feet activity did not occur: Safety/medical concerns        Walk 50 feet activity   Assist Walk 50 feet  with 2 turns activity did not occur: Safety/medical concerns         Walk 150 feet activity   Assist Walk 150 feet activity did not occur: Safety/medical concerns         Walk 10 feet on uneven surface  activity   Assist Walk 10 feet on uneven surfaces activity did not occur: Safety/medical concerns         Wheelchair     Assist Is the patient using a wheelchair?: Yes Type of Wheelchair: Manual Wheelchair activity did not occur: Safety/medical concerns (unable 2/2 pain, fatigue, weakness)    Max wheelchair distance: 0    Wheelchair 50 feet with 2 turns activity    Assist    Wheelchair 50 feet with 2 turns activity did not occur: Safety/medical concerns       Wheelchair 150 feet activity     Assist  Wheelchair 150 feet activity did not occur: Safety/medical concerns       Blood pressure 136/70, pulse 80, temperature 98.2 F (36.8 C), resp. rate 14, height '5\' 3"'$  (1.6 m), weight 77 kg, SpO2 99 %.    Medical Problem List and Plan: 1. Functional deficits secondary to subdural hematoma and ICH             - patient may shower with PEG site covered - ELOS/Goals: goal DC 08/27/22 , downgraded to Mod-Max Ax1 PT/OT/SLP             - Does well communicating with writing board and fingers for y/n             - Family requests change from DNR to full code on admission; changed   -Stable for discharge from inpatient rehab - 2/6 ordered L PRAFO, WHO, and soft cervical collar to help with head positioning OOB - 2/16: family meeting regarding Dispo plan, recommending full time skilled nursing care for significant deficits, family discussing [name] vs. Private care - now placement  2.  Antithrombotics: -DVT/anticoagulation:  Mechanical:  Antiembolism stockings, knee (TED hose) Bilateral lower extremities; add SCDs             -antiplatelet therapy: none - Duplex LUE - 2/8 superficial vein thrombosis involving the left cephalic vein-- warm compresses and  conservative treatment   3. Pain Management: Tylenol and Robaxin '500mg'$  q6h as needed  - improved   - 2/6: lidocaine patch to L shoulder for pain - benefitting    - 2/9: voltaren gel 2g to L shoulder, b/l knees  - 2/15: schedule Tylenol 650 mg Q8H, gabapentin 300 mg TID Prn   - 2/16: schedule gabapentin  100 mg TID - improved  4. Mood/Behavior/Sleep: LCSW to evaluate and provide emotional support             -antipsychotic agents: n/a  -Trazodone PRN - 2/20: will start Fluoxetine 20 mg QHS for depressed mood and motor recovery  - 2/22: Affect/mood improved, new motor gains LUE!   5. Neuropsych/cognition: This patient is not capable of making decisions on her own behalf.   6. Skin/Wound Care: Routine skin care checks - coccyx PI with scab  - routine care - LAL mattress - Dressings under PEG tube site to keep area dry, as appears moisture trapping under button 2/9 - improved - Nystatin PO QID for candida, will need suctioned after use 2/21; improving appearance of tongue swelling and mouth plaques   7. Fluids/Electrolytes/Nutrition: Routine Is and Os and follow-up chemistries             -NPO; has PEG             -continue Glucerna 1.5 continuous Tfs  - 2/9: Add potasium supplementation 20 meq BID; 2/11 dced - 2/12: Add 40 meq daily for K 3.4 ;Repeat in 2-3 days--not done, f/up on next labs 08/18/22 - stable 3.7  - Protein calorie malnutrition - on Juven BID, vitamins - Consult dietary to follow for titration - BG mildly elevated but in good range given age, monitor - 2/14: per dietary, cannot transition to bolus feeds d/t J tube. Looking into reducing hours of TF in daytime for ease of admin in home setting.  - 2/15: confirmed G tube with no J component; switching to bolus feeds. Will need to switch BG and insulin to [facility] and HS.   - 08/16/22: well controlled, changed CBG checks and SSI to TID AC+QHS  -08/17/22 CBGs looking great, cont to monitor  2/19: elevated BG yesterday; monitor 1+ day  for adjustments  2/20: MBS 2/21 scheduled; BG elevated, will start Metformin 500 mg QAM per tube  2/21: MBS results as above; starting PO trials with SLP!Marland Kitchen Monitor BG with initiation of metformin QD   - 2/22: Increase metformin to twice daily   Recent Labs    08/22/22 0451 08/22/22 0811 08/22/22 1140  GLUCAP 98 169* 140*       8: Hypertension: monitor TID and prn             -continue amlodipine 10 mg daily             -losartan 50 mg daily -> Dced 2/3 d/t concern for angioedema               - Add hydralazine 10 mg q8h PRN for SBP >180, DBP>100 -2/4: BP stable/high; mild tachycardia this AM; if AM labs do not show dehydration, would start HCTZ 12.5 mg daily for adjunctive control -2/5 BP with improvement over last 12 hours--will hold off on adding any further medication right now.  -2/6 - mild HTN, no med changes, labs in AM  -2/7 -   HTN remains mild; start hctz 6.325 mg per tube daily; labs in AM 2/9  -2/9: DC HCTZ, switch to Coreg 3.125 BID, DC K supplement  -2/12: Monitor, if consistently HTN can increase coreg  -2/13: increase to 6.5 mg BID  - 08/17/22 BP/HR well controlled, cont regimen (amlodipine '10mg'$  QD, Coreg 6.'25mg'$  BID, Hydralazine '10mg'$  q8h PRN)  - 2/22: Mild BP increase; monitor 1 day before changing medications Vitals:   08/18/22 1917 08/19/22 0255 08/19/22 1307 08/19/22 2017  BP: Marland Kitchen)  132/53 139/65 124/77 124/63   08/20/22 0432 08/20/22 1307 08/20/22 1940 08/21/22 0455  BP: (!) 148/76 129/70 (!) 148/67 (!) 149/71   08/21/22 1407 08/21/22 1941 08/22/22 0450 08/22/22 1234  BP: 135/74 137/70 131/74 136/70     9: Hyperlipidemia: continue Lipitor '80mg'$  QD, Zetia '10mg'$  QD   10: SDH/ICH s/p crani: Finished Keppra for sz ppx.    11: Leukocytosis: current temperature 99.3, ceftazidime completed 1/29 - resolved -Follow-up CBC with differential, mild WBC increase 2.2 likely d/t POD#1; downtrending 2/3 - Fever overnight prior to admission; POD from PEG, monitor for  recurrence  - No fever recurrence 2/3; monitor - 2/6: Labs in AM. CXR today for course lung sounds, and duplex for LUE swelling. Strict oral care QID    - 2/7: AM labs with mild subsegmental atelectasis on R; encourage ISB  -2/10 WBC 7.0 on 2/7, no signs of infection  -08/11/22 WBC 6.4, cont to monitor on weekly labs, next 08/18/22 - WNL  12: Anemia, multifactorial:  Stable HgB 8-9             -Follow-up CBC -hgb with sl decrease again today, normocytic  -check stool for OB  -PA added b-supp. Add Fe++ too  - 2/9: transfuse 1 unit for HgB 6.4 today. FOBT pending.   -2/10 FOBT pos, recheck, recheck H&H tomorrow, consider GI consult -2/11 H/H stable, spoke with GI, no intervention at this time supportive care, GI ordered BMP,  re consult if HGB continues to decrease, appreciate assistance   -2/12: HgB stable, monitor on weekly labs next 08/18/22   13. ?Angioedema vs. Chlorhexadine reaction - improving, appears no dependent - Improving per family after DC chlorhexidine skin wipes and oral mouthwash              - Per SLP notes, interferes with tongue movement, speaking - Hx cough with Enalipril, Losartan increased from OP 25 mg to 50 mg 1/14, never weaned; edema started 1/30 per chart review - d/w pharmacy 2/4 and agree that reaction can occur at any time, and may cross-react with ARB. Will attempt to titrate BP meds without ACE/ARBs and monitor swelling.   - reposition head in bed to avoid dependent edema  14. L hemineglect - causing R gaze preference and positional tone             - Repositioning in bed to straighten head--head more neutral today 2/5 - May need to consider botox given need to recover cognitive function and relatively little tone elsewhere  - 2/6: E stim and taping to L shoulder for mild sublux per OT.   15. Urinary incontinence - ongoing  - Foley removed 2/6  - PVRs QID and ISC for >350 ccs retention - 2/7: 1x PVR low; monitor; ? Incontinence vs mixed, will consider timed  toiletting after more information gathered  - 2/12: Start timed toiletting Q4H for urgency  16. Dysphagia, s/p PEG placement.   - Remains NPO  - Strict oral care QID  - CXR  given course breath sounds, weak cough, poor secretion clearance - R subsegmental atelectasis, encourage ISB - See above  17. Diarrhea - stable/improving - loose stools likely d/t tube feeds; on fibercon; will add immodium for >2 diarrhea BM in 24 hours   - 2/15: daily Bms now, improved  -08/16/22 BM x2 today, cont to monitor.   -08/17/22 1 BM this morning during visit, but overall improving, monitor  - 2/21: KUB with moderate stool burden; DC fibercon, start QD  miralax 17 g   - 2/22: 2x smears for BM; increase to miralax BID  -2-23: Large liquid BM this a.m., DC MiraLAX, likely from initiation of metformin  18. Bibasilar crackles. CXR IMPRESSION: 1. Mildly decreased lung volumes, although improved from most recent 08/05/2022 radiograph. 2. Right basilar subsegmental atelectasis. No definite focal airspace opacity to indicate pneumonia.  - ISB as tolerated; no other interventions at this time    LOS: 20 days A FACE TO [place] 08/22/2022, 1:11 PM

## 2022-08-22 NOTE — Progress Notes (Signed)
Inpatient Rehabilitation Discharge Medication Review by a Pharmacist  A complete drug regimen review was completed for this patient to identify any potential clinically significant medication issues.  High Risk Drug Classes Is patient taking? Indication by Medication  Antipsychotic No   Anticoagulant No   Antibiotic No   Opioid No   Antiplatelet No   Hypoglycemics/insulin Yes Insulin, metformin - glucose control  Vasoactive Medication Yes Amlodipine, Carvedilol- BP  Chemotherapy No   Other Yes Atorvastatin, ezetimibe - HLD Famotidine - reflux Fluoxetine - mood Gabapentin, Lidocaine - pain     Type of Medication Issue Identified Description of Issue Recommendation(s)  Drug Interaction(s) (clinically significant)     Duplicate Therapy     Allergy     No Medication Administration End Date     Incorrect Dose     Additional Drug Therapy Needed     Significant med changes from prior encounter (inform family/care partners about these prior to discharge).    Other       Clinically significant medication issues were identified that warrant physician communication and completion of prescribed/recommended actions by midnight of the next day:  No  Pharmacist comments: None - to [name]  Time spent performing this drug regimen review (minutes):  20 minutes  Thank you Chipper Oman

## 2022-08-22 NOTE — Plan of Care (Signed)
2 out of 7 goals not met due to severity of deficits, though progress was made.   Problem: RH Swallowing Goal: LTG Patient will consume least restrictive diet using compensatory strategies with assista Problem: RH Swallowing Goal: LTG Patient will participate in dysphagia therapy to increase swallow function with assistance (SLP) Description: LTG:  Patient will participate in dysphagia therapy to increase swallow function with assistance (SLP) Outcome: Completed/Met Goal: LTG Pt will demonstrate functional change in swallow as evidenced by bedside/clinical objective assessment (SLP) Description: LTG: Patient will demonstrate functional change in swallow as evidenced by bedside/clinical objective assessment (SLP) Outcome: Completed/Met   Problem: RH Expression Communication Goal: LTG Patient will increase speech intelligibility (SLP) Description: LTG: Patient will increase speech intelligibility at word/phrase/conversation level with cues, % of the time (SLP) Outcome: Completed/Met   Problem: RH Problem Solving Goal: LTG Patient will demonstrate problem solving for (SLP) Description: LTG:  Patient will demonstrate problem solving for basic/complex daily situations with cues  (SLP) Outcome: Completed/Met   Problem: RH Attention Goal: LTG Patient will demonstrate this level of attention during functional activites (SLP) Description: LTG:  Patient will will demonstrate this level of attention during functional activites (SLP) Outcome: Completed/Met  nce (SLP) Description: LTG:  Patient will consume least restrictive diet using compensatory strategies with assistance (SLP) Outcome: Not Met (add Reason)   Problem: RH Awareness Goal: LTG: Patient will demonstrate awareness during functional activites type of (SLP) Description: LTG: Patient will demonstrate awareness during functional activites type of (SLP) Outcome: Not Met (add Reason)

## 2022-08-22 NOTE — Progress Notes (Signed)
Occupational Therapy Session Note  Patient Details  Name: Gina Kelley MRN: [id number] Date of Birth: [birth year]  Today's Date: 08/22/2022 OT Individual Time: [id number] OT Individual Time Calculation (min): 71 min    Short Term Goals: Week 3:  OT Short Term Goal 1 (Week 3): LTG=stg 2/2 ELOS  Skilled Therapeutic Interventions/Progress Updates:    Pt greeted semi-reclined in bed asleep. Pt more sleepy this morning and took environmental changes with llghts to achieve alertness. Rolling in bed with max A of 1 for total A peri-care and brief change. OT went to assist pt with donning pants at bed level and noted pt to be incontinent of urine again, so rolling and peri-care completed again as described in detail above. Max A to sit up at EOB. Pt able to get to close supervision for only a short time today, requiring mostly mod A for sitting balance. OT had pt lean down onto pillow on R side, but she was unable to maintain this position. Continued working on sitting balance at EOB while performing UB bathing/dressing with max A. Pt initiated washing face with moderate cues and encouragement. Max A quat-pivot to TIS wc. L UE NMR with joint input through wrist and hand to bring pt through full ROM. Pt able to push and even pull slightly. OT placed tubigrip on L arm for edema management. Pt left seated in TIS wc with wc turned around to encourage head turn to the L as pt keeps head to the R. Seat belt on and nursing notified of pt status.   Therapy Documentation Precautions:  Precautions Precautions: Fall Precaution Comments: peg tube. HOB no lower than 30*. Left dense hemi. right gaze preference, left side inattention Required Braces or Orthoses: Other Brace Other Brace: PRAFO boots on in bed Restrictions Weight Bearing Restrictions: No Pain:  Denies pain   Therapy/Group: Individual Therapy  Valma Cava 08/22/2022, 8:54 AM

## 2022-08-22 NOTE — Progress Notes (Signed)
Inpatient Rehabilitation Care Coordinator Discharge Note   Patient Details  Name: Gina Kelley MRN: [id number] Date of Birth: 05-25-1936   Discharge location: D/c to home  Length of Stay: 20 days  Discharge activity level: Max Asst  Home/community participation: Limited  Patient response [id number] Literacy - How often do you need to have someone help you when you read instructions, pamphlets, or other written material from your doctor or pharmacy?: Never  Patient response [id number] Isolation - How often do you feel lonely or isolated from those around you?: Patient unable to respond  Services provided included: MD, RD, PT, OT, SLP, CM, TR, Neuropsych, SW, Pharmacy, Financial risk analyst Services:  Charity fundraiser Utilized: [place] Medicare  Choices offered to/list presented to: patient family  Follow-up services arranged:  Other (Comment) ([name])           Patient response to transportation need: Is the patient able to respond to transportation needs?: Yes In the past 12 months, has lack of transportation kept you from medical appointments or from getting medications?: No In the past 12 months, has lack of transportation kept you from meetings, work, or from getting things needed for daily living?: No   Comments (or additional information):  Patient/Family verbalized understanding of follow-up arrangements:  Yes  Individual responsible for coordination of the follow-up plan: contact pt dtr Gina Kelley [phone]  Confirmed correct DME delivered: Rana Snare 08/22/2022    Rana Snare

## 2022-08-22 NOTE — Progress Notes (Signed)
Report given to [place] ([facility]/[name]).   Yehuda Mao, LPN

## 2022-08-22 NOTE — Progress Notes (Signed)
Occupational Therapy Discharge Summary  Patient Details  Name: Gina Kelley MRN: [id number] Date of Birth: 13-Aug-1935  Date of Discharge from OT service:August 22, 2022   Patient has met 4 of 5 long term goals due to improved activity tolerance, improved balance, postural control, ability to compensate for deficits, functional use of  LEFT upper and LEFT lower extremity, improved attention, improved awareness, and improved coordination.  Patient to discharge at [facility] Max Assist level.  Patient's care partner unavailable to provide the necessary physical and cognitive assistance at discharge, therefore recommend pt dc to [name] for continued rehabilitation.   Reasons goals not met:  Pt still requires min A for dynamic sitting balance.   Recommendation:  Patient will benefit from ongoing skilled OT services in skilled nursing facility setting to continue to advance functional skills in the area of BADL and Reduce care partner burden.  Equipment: No equipment provided due to continued rehabilitation at [name]  Reasons for discharge: treatment goals met and discharge from hospital  Patient/family agrees with progress made and goals achieved: Yes  OT Discharge Precautions/Restrictions  Precautions Precautions: Fall Restrictions Weight Bearing Restrictions: No Pain  Denies pain ADL ADL Eating: NPO Grooming: Maximal assistance Where Assessed-Grooming: Wheelchair Upper Body Bathing: Maximal assistance Where Assessed-Upper Body Bathing: Sitting at sink, Wheelchair, Bed level Lower Body Bathing: Dependent Where Assessed-Lower Body Bathing: Bed level Upper Body Dressing: Maximal assistance Where Assessed-Upper Body Dressing: Wheelchair, Sitting at sink, Bed level Lower Body Dressing: Dependent Where Assessed-Lower Body Dressing: Bed level Toileting: Dependent Where Assessed-Toileting: Bed level Toilet Transfer: Maximal assistance Toilet Transfer Method: Squat pivot Tub/Shower  Transfer: Not assessed Social research officer, government: Not assessed Perception  Perception: Impaired Inattention/Neglect: Does not attend to left visual field;Does not attend to left side of body Spatial Orientation: impaired Praxis Praxis: Impaired Praxis Impairment Details: Motor planning Cognition Cognition Overall Cognitive Status: Impaired/Different from baseline Orientation Level: Person;Place;Situation Memory: Impaired Sustained Attention: Impaired Awareness: Impaired Safety/Judgment: Impaired Brief Interview for Mental Status (BIMS) Repetition of Three Words (First Attempt): 3 Temporal Orientation: Year: Missed by 2 to 5 years Temporal Orientation: Month: Missed by 6 days to 1 month Temporal Orientation: Day: Incorrect Recall: "Sock": No, could not recall Recall: "Blue": No, could not recall Recall: "Bed": No, could not recall BIMS Summary Score: 5 Sensation Sensation Proprioception: Impaired by gross assessment Coordination Gross Motor Movements are Fluid and Coordinated: No Fine Motor Movements are Fluid and Coordinated: No Coordination and Movement Description: grossly uncoordinated 2/2 L hemi Motor  Motor Motor: Hemiplegia;Abnormal postural alignment and control Motor - Discharge Observations: L hemiplegia Mobility  Bed Mobility Bed Mobility: Rolling Right;Rolling Left;Supine to Sit;Sit to Supine Rolling Right: Maximal Assistance - Patient 25-49% Rolling Left: Maximal Assistance - Patient 25-49% Supine to Sit: Maximal Assistance - Patient - Patient 25-49% Sit to Supine: Maximal Assistance - Patient 25-49%  Balance Static Sitting Balance Static Sitting - Balance Support: Feet supported;No upper extremity supported Static Sitting - Level of Assistance: 4: Min assist;5: Stand by assistance Dynamic Sitting Balance Dynamic Sitting - Level of Assistance: 4: Min assist;3: Mod assist Static Standing Balance Static Standing - Level of Assistance: 1: +1 Total  assist Extremity/Trunk Assessment RUE Assessment RUE Assessment: Within Functional Limits LUE Assessment LUE Assessment: Exceptions to [facility] General Strength Comments: shoulder and elbow activation, starting to get some flicker in hand LUE Body System: Neuro Brunstrum levels for arm and hand: Arm;Hand Brunstrum level for arm: Stage II Synergy is developing Brunstrum level for hand: Stage I Flaccidity;Stage II Synergy  is developing   Valma Cava 08/22/2022, 2:40 PM

## 2022-08-25 DIAGNOSIS — E1122 Type 2 diabetes mellitus with diabetic chronic kidney disease: Secondary | ICD-10-CM | POA: Diagnosis not present

## 2022-08-25 DIAGNOSIS — I69891 Dysphagia following other cerebrovascular disease: Secondary | ICD-10-CM | POA: Diagnosis not present

## 2022-08-25 DIAGNOSIS — D509 Iron deficiency anemia, unspecified: Secondary | ICD-10-CM | POA: Diagnosis not present

## 2022-08-25 DIAGNOSIS — E119 Type 2 diabetes mellitus without complications: Secondary | ICD-10-CM | POA: Diagnosis not present

## 2022-08-25 DIAGNOSIS — R41841 Cognitive communication deficit: Secondary | ICD-10-CM | POA: Diagnosis not present

## 2022-08-25 DIAGNOSIS — E441 Mild protein-calorie malnutrition: Secondary | ICD-10-CM | POA: Diagnosis not present

## 2022-08-25 DIAGNOSIS — E785 Hyperlipidemia, unspecified: Secondary | ICD-10-CM | POA: Diagnosis not present

## 2022-08-25 DIAGNOSIS — I69354 Hemiplegia and hemiparesis following cerebral infarction affecting left non-dominant side: Secondary | ICD-10-CM | POA: Diagnosis not present

## 2022-08-26 DIAGNOSIS — I69354 Hemiplegia and hemiparesis following cerebral infarction affecting left non-dominant side: Secondary | ICD-10-CM | POA: Diagnosis not present

## 2022-08-26 DIAGNOSIS — I69891 Dysphagia following other cerebrovascular disease: Secondary | ICD-10-CM | POA: Diagnosis not present

## 2022-08-27 DIAGNOSIS — I69354 Hemiplegia and hemiparesis following cerebral infarction affecting left non-dominant side: Secondary | ICD-10-CM | POA: Diagnosis not present

## 2022-08-27 DIAGNOSIS — I69891 Dysphagia following other cerebrovascular disease: Secondary | ICD-10-CM | POA: Diagnosis not present

## 2022-08-28 ENCOUNTER — Telehealth: Payer: Self-pay

## 2022-08-28 DIAGNOSIS — F331 Major depressive disorder, recurrent, moderate: Secondary | ICD-10-CM | POA: Diagnosis not present

## 2022-08-28 NOTE — Telephone Encounter (Signed)
Error

## 2022-08-29 DIAGNOSIS — I69354 Hemiplegia and hemiparesis following cerebral infarction affecting left non-dominant side: Secondary | ICD-10-CM | POA: Diagnosis not present

## 2022-08-29 DIAGNOSIS — I69891 Dysphagia following other cerebrovascular disease: Secondary | ICD-10-CM | POA: Diagnosis not present

## 2022-08-29 DIAGNOSIS — E1122 Type 2 diabetes mellitus with diabetic chronic kidney disease: Secondary | ICD-10-CM | POA: Diagnosis not present

## 2022-08-29 DIAGNOSIS — R41841 Cognitive communication deficit: Secondary | ICD-10-CM | POA: Diagnosis not present

## 2022-09-03 DIAGNOSIS — R634 Abnormal weight loss: Secondary | ICD-10-CM | POA: Diagnosis not present

## 2022-09-03 DIAGNOSIS — I69891 Dysphagia following other cerebrovascular disease: Secondary | ICD-10-CM | POA: Diagnosis not present

## 2022-09-03 DIAGNOSIS — E46 Unspecified protein-calorie malnutrition: Secondary | ICD-10-CM | POA: Diagnosis not present

## 2022-09-04 DIAGNOSIS — E119 Type 2 diabetes mellitus without complications: Secondary | ICD-10-CM | POA: Diagnosis not present

## 2022-09-04 DIAGNOSIS — E559 Vitamin D deficiency, unspecified: Secondary | ICD-10-CM | POA: Diagnosis not present

## 2022-09-04 DIAGNOSIS — I69891 Dysphagia following other cerebrovascular disease: Secondary | ICD-10-CM | POA: Diagnosis not present

## 2022-09-04 DIAGNOSIS — R41841 Cognitive communication deficit: Secondary | ICD-10-CM | POA: Diagnosis not present

## 2022-09-04 DIAGNOSIS — E46 Unspecified protein-calorie malnutrition: Secondary | ICD-10-CM | POA: Diagnosis not present

## 2022-09-08 DIAGNOSIS — L22 Diaper dermatitis: Secondary | ICD-10-CM | POA: Diagnosis not present

## 2022-09-10 DIAGNOSIS — I69891 Dysphagia following other cerebrovascular disease: Secondary | ICD-10-CM | POA: Diagnosis not present

## 2022-09-10 DIAGNOSIS — I69354 Hemiplegia and hemiparesis following cerebral infarction affecting left non-dominant side: Secondary | ICD-10-CM | POA: Diagnosis not present

## 2022-09-17 ENCOUNTER — Encounter: Payer: Medicare HMO | Attending: Registered Nurse | Admitting: Registered Nurse

## 2022-09-17 DIAGNOSIS — R509 Fever, unspecified: Secondary | ICD-10-CM | POA: Diagnosis not present

## 2022-09-17 DIAGNOSIS — I69891 Dysphagia following other cerebrovascular disease: Secondary | ICD-10-CM | POA: Diagnosis not present

## 2022-09-17 DIAGNOSIS — R051 Acute cough: Secondary | ICD-10-CM | POA: Diagnosis not present

## 2022-09-17 DIAGNOSIS — R4182 Altered mental status, unspecified: Secondary | ICD-10-CM | POA: Diagnosis not present

## 2022-09-19 DIAGNOSIS — I69891 Dysphagia following other cerebrovascular disease: Secondary | ICD-10-CM | POA: Diagnosis not present

## 2022-09-22 DIAGNOSIS — L89312 Pressure ulcer of right buttock, stage 2: Secondary | ICD-10-CM | POA: Diagnosis not present

## 2022-09-22 DIAGNOSIS — J069 Acute upper respiratory infection, unspecified: Secondary | ICD-10-CM | POA: Diagnosis not present

## 2022-09-24 DIAGNOSIS — R2689 Other abnormalities of gait and mobility: Secondary | ICD-10-CM | POA: Diagnosis not present

## 2022-09-24 DIAGNOSIS — R5381 Other malaise: Secondary | ICD-10-CM | POA: Diagnosis not present

## 2022-09-24 DIAGNOSIS — M25561 Pain in right knee: Secondary | ICD-10-CM | POA: Diagnosis not present

## 2022-09-25 DIAGNOSIS — R5383 Other fatigue: Secondary | ICD-10-CM | POA: Diagnosis not present

## 2022-09-25 DIAGNOSIS — L22 Diaper dermatitis: Secondary | ICD-10-CM | POA: Diagnosis not present

## 2022-09-25 DIAGNOSIS — E119 Type 2 diabetes mellitus without complications: Secondary | ICD-10-CM | POA: Diagnosis not present

## 2022-09-25 DIAGNOSIS — E441 Mild protein-calorie malnutrition: Secondary | ICD-10-CM | POA: Diagnosis not present

## 2022-09-25 DIAGNOSIS — D509 Iron deficiency anemia, unspecified: Secondary | ICD-10-CM | POA: Diagnosis not present

## 2022-09-25 DIAGNOSIS — F339 Major depressive disorder, recurrent, unspecified: Secondary | ICD-10-CM | POA: Diagnosis not present

## 2022-09-25 DIAGNOSIS — F419 Anxiety disorder, unspecified: Secondary | ICD-10-CM | POA: Diagnosis not present

## 2022-09-25 DIAGNOSIS — R4182 Altered mental status, unspecified: Secondary | ICD-10-CM | POA: Diagnosis not present

## 2022-09-29 DIAGNOSIS — E441 Mild protein-calorie malnutrition: Secondary | ICD-10-CM | POA: Diagnosis not present

## 2022-09-29 DIAGNOSIS — I69891 Dysphagia following other cerebrovascular disease: Secondary | ICD-10-CM | POA: Diagnosis not present

## 2022-09-29 DIAGNOSIS — L22 Diaper dermatitis: Secondary | ICD-10-CM | POA: Diagnosis not present

## 2022-09-29 DIAGNOSIS — J069 Acute upper respiratory infection, unspecified: Secondary | ICD-10-CM | POA: Diagnosis not present

## 2022-09-29 DIAGNOSIS — E119 Type 2 diabetes mellitus without complications: Secondary | ICD-10-CM | POA: Diagnosis not present

## 2022-09-29 DIAGNOSIS — E1122 Type 2 diabetes mellitus with diabetic chronic kidney disease: Secondary | ICD-10-CM | POA: Diagnosis not present

## 2022-09-29 DIAGNOSIS — D509 Iron deficiency anemia, unspecified: Secondary | ICD-10-CM | POA: Diagnosis not present

## 2022-09-29 DIAGNOSIS — R5383 Other fatigue: Secondary | ICD-10-CM | POA: Diagnosis not present

## 2022-10-01 DIAGNOSIS — J069 Acute upper respiratory infection, unspecified: Secondary | ICD-10-CM | POA: Diagnosis not present

## 2022-10-01 DIAGNOSIS — M25561 Pain in right knee: Secondary | ICD-10-CM | POA: Diagnosis not present

## 2022-10-01 DIAGNOSIS — R5381 Other malaise: Secondary | ICD-10-CM | POA: Diagnosis not present

## 2022-10-01 DIAGNOSIS — E46 Unspecified protein-calorie malnutrition: Secondary | ICD-10-CM | POA: Diagnosis not present

## 2022-10-01 DIAGNOSIS — I69891 Dysphagia following other cerebrovascular disease: Secondary | ICD-10-CM | POA: Diagnosis not present

## 2022-10-01 DIAGNOSIS — R2689 Other abnormalities of gait and mobility: Secondary | ICD-10-CM | POA: Diagnosis not present

## 2022-10-03 DIAGNOSIS — I69891 Dysphagia following other cerebrovascular disease: Secondary | ICD-10-CM | POA: Diagnosis not present

## 2022-10-04 DIAGNOSIS — R471 Dysarthria and anarthria: Secondary | ICD-10-CM | POA: Diagnosis not present

## 2022-10-04 DIAGNOSIS — R1312 Dysphagia, oropharyngeal phase: Secondary | ICD-10-CM | POA: Diagnosis not present

## 2022-10-04 DIAGNOSIS — R41841 Cognitive communication deficit: Secondary | ICD-10-CM | POA: Diagnosis not present

## 2022-10-04 DIAGNOSIS — I69354 Hemiplegia and hemiparesis following cerebral infarction affecting left non-dominant side: Secondary | ICD-10-CM | POA: Diagnosis not present

## 2022-10-04 DIAGNOSIS — M6281 Muscle weakness (generalized): Secondary | ICD-10-CM | POA: Diagnosis not present

## 2022-10-06 DIAGNOSIS — K219 Gastro-esophageal reflux disease without esophagitis: Secondary | ICD-10-CM | POA: Diagnosis not present

## 2022-10-06 DIAGNOSIS — R197 Diarrhea, unspecified: Secondary | ICD-10-CM | POA: Diagnosis not present

## 2022-10-06 DIAGNOSIS — E441 Mild protein-calorie malnutrition: Secondary | ICD-10-CM | POA: Diagnosis not present

## 2022-10-06 DIAGNOSIS — S065XAD Traumatic subdural hemorrhage with loss of consciousness status unknown, subsequent encounter: Secondary | ICD-10-CM | POA: Diagnosis not present

## 2022-10-06 DIAGNOSIS — L22 Diaper dermatitis: Secondary | ICD-10-CM | POA: Diagnosis not present

## 2022-10-06 DIAGNOSIS — I619 Nontraumatic intracerebral hemorrhage, unspecified: Secondary | ICD-10-CM | POA: Diagnosis not present

## 2022-10-06 DIAGNOSIS — R41841 Cognitive communication deficit: Secondary | ICD-10-CM | POA: Diagnosis not present

## 2022-10-06 DIAGNOSIS — E119 Type 2 diabetes mellitus without complications: Secondary | ICD-10-CM | POA: Diagnosis not present

## 2022-10-06 DIAGNOSIS — Z79899 Other long term (current) drug therapy: Secondary | ICD-10-CM | POA: Diagnosis not present

## 2022-10-06 DIAGNOSIS — R7309 Other abnormal glucose: Secondary | ICD-10-CM | POA: Diagnosis not present

## 2022-10-06 DIAGNOSIS — R1312 Dysphagia, oropharyngeal phase: Secondary | ICD-10-CM | POA: Diagnosis not present

## 2022-10-06 DIAGNOSIS — I69354 Hemiplegia and hemiparesis following cerebral infarction affecting left non-dominant side: Secondary | ICD-10-CM | POA: Diagnosis not present

## 2022-10-06 DIAGNOSIS — M6281 Muscle weakness (generalized): Secondary | ICD-10-CM | POA: Diagnosis not present

## 2022-10-06 DIAGNOSIS — I69891 Dysphagia following other cerebrovascular disease: Secondary | ICD-10-CM | POA: Diagnosis not present

## 2022-10-06 DIAGNOSIS — R471 Dysarthria and anarthria: Secondary | ICD-10-CM | POA: Diagnosis not present

## 2022-10-06 DIAGNOSIS — D509 Iron deficiency anemia, unspecified: Secondary | ICD-10-CM | POA: Diagnosis not present

## 2022-10-08 DIAGNOSIS — R1312 Dysphagia, oropharyngeal phase: Secondary | ICD-10-CM | POA: Diagnosis not present

## 2022-10-08 DIAGNOSIS — R41841 Cognitive communication deficit: Secondary | ICD-10-CM | POA: Diagnosis not present

## 2022-10-08 DIAGNOSIS — I69354 Hemiplegia and hemiparesis following cerebral infarction affecting left non-dominant side: Secondary | ICD-10-CM | POA: Diagnosis not present

## 2022-10-08 DIAGNOSIS — M6281 Muscle weakness (generalized): Secondary | ICD-10-CM | POA: Diagnosis not present

## 2022-10-08 DIAGNOSIS — R471 Dysarthria and anarthria: Secondary | ICD-10-CM | POA: Diagnosis not present

## 2022-10-09 DIAGNOSIS — R41841 Cognitive communication deficit: Secondary | ICD-10-CM | POA: Diagnosis not present

## 2022-10-09 DIAGNOSIS — R471 Dysarthria and anarthria: Secondary | ICD-10-CM | POA: Diagnosis not present

## 2022-10-09 DIAGNOSIS — R1312 Dysphagia, oropharyngeal phase: Secondary | ICD-10-CM | POA: Diagnosis not present

## 2022-10-09 DIAGNOSIS — M6281 Muscle weakness (generalized): Secondary | ICD-10-CM | POA: Diagnosis not present

## 2022-10-09 DIAGNOSIS — I69354 Hemiplegia and hemiparesis following cerebral infarction affecting left non-dominant side: Secondary | ICD-10-CM | POA: Diagnosis not present

## 2022-10-10 DIAGNOSIS — R1312 Dysphagia, oropharyngeal phase: Secondary | ICD-10-CM | POA: Diagnosis not present

## 2022-10-10 DIAGNOSIS — R41841 Cognitive communication deficit: Secondary | ICD-10-CM | POA: Diagnosis not present

## 2022-10-10 DIAGNOSIS — R471 Dysarthria and anarthria: Secondary | ICD-10-CM | POA: Diagnosis not present

## 2022-10-10 DIAGNOSIS — I69354 Hemiplegia and hemiparesis following cerebral infarction affecting left non-dominant side: Secondary | ICD-10-CM | POA: Diagnosis not present

## 2022-10-10 DIAGNOSIS — M6281 Muscle weakness (generalized): Secondary | ICD-10-CM | POA: Diagnosis not present

## 2022-10-11 DIAGNOSIS — R41841 Cognitive communication deficit: Secondary | ICD-10-CM | POA: Diagnosis not present

## 2022-10-11 DIAGNOSIS — I69354 Hemiplegia and hemiparesis following cerebral infarction affecting left non-dominant side: Secondary | ICD-10-CM | POA: Diagnosis not present

## 2022-10-11 DIAGNOSIS — R471 Dysarthria and anarthria: Secondary | ICD-10-CM | POA: Diagnosis not present

## 2022-10-11 DIAGNOSIS — R1312 Dysphagia, oropharyngeal phase: Secondary | ICD-10-CM | POA: Diagnosis not present

## 2022-10-11 DIAGNOSIS — M6281 Muscle weakness (generalized): Secondary | ICD-10-CM | POA: Diagnosis not present

## 2022-10-12 DIAGNOSIS — R41841 Cognitive communication deficit: Secondary | ICD-10-CM | POA: Diagnosis not present

## 2022-10-12 DIAGNOSIS — R471 Dysarthria and anarthria: Secondary | ICD-10-CM | POA: Diagnosis not present

## 2022-10-12 DIAGNOSIS — M6281 Muscle weakness (generalized): Secondary | ICD-10-CM | POA: Diagnosis not present

## 2022-10-12 DIAGNOSIS — R1312 Dysphagia, oropharyngeal phase: Secondary | ICD-10-CM | POA: Diagnosis not present

## 2022-10-12 DIAGNOSIS — I69354 Hemiplegia and hemiparesis following cerebral infarction affecting left non-dominant side: Secondary | ICD-10-CM | POA: Diagnosis not present

## 2022-10-13 DIAGNOSIS — R471 Dysarthria and anarthria: Secondary | ICD-10-CM | POA: Diagnosis not present

## 2022-10-13 DIAGNOSIS — E119 Type 2 diabetes mellitus without complications: Secondary | ICD-10-CM | POA: Diagnosis not present

## 2022-10-13 DIAGNOSIS — M6281 Muscle weakness (generalized): Secondary | ICD-10-CM | POA: Diagnosis not present

## 2022-10-13 DIAGNOSIS — D509 Iron deficiency anemia, unspecified: Secondary | ICD-10-CM | POA: Diagnosis not present

## 2022-10-13 DIAGNOSIS — I69354 Hemiplegia and hemiparesis following cerebral infarction affecting left non-dominant side: Secondary | ICD-10-CM | POA: Diagnosis not present

## 2022-10-13 DIAGNOSIS — R41841 Cognitive communication deficit: Secondary | ICD-10-CM | POA: Diagnosis not present

## 2022-10-13 DIAGNOSIS — E441 Mild protein-calorie malnutrition: Secondary | ICD-10-CM | POA: Diagnosis not present

## 2022-10-13 DIAGNOSIS — R1312 Dysphagia, oropharyngeal phase: Secondary | ICD-10-CM | POA: Diagnosis not present

## 2022-10-13 DIAGNOSIS — L22 Diaper dermatitis: Secondary | ICD-10-CM | POA: Diagnosis not present

## 2022-10-14 DIAGNOSIS — I69354 Hemiplegia and hemiparesis following cerebral infarction affecting left non-dominant side: Secondary | ICD-10-CM | POA: Diagnosis not present

## 2022-10-14 DIAGNOSIS — R1312 Dysphagia, oropharyngeal phase: Secondary | ICD-10-CM | POA: Diagnosis not present

## 2022-10-14 DIAGNOSIS — R471 Dysarthria and anarthria: Secondary | ICD-10-CM | POA: Diagnosis not present

## 2022-10-14 DIAGNOSIS — R41841 Cognitive communication deficit: Secondary | ICD-10-CM | POA: Diagnosis not present

## 2022-10-14 DIAGNOSIS — M6281 Muscle weakness (generalized): Secondary | ICD-10-CM | POA: Diagnosis not present

## 2022-10-16 ENCOUNTER — Ambulatory Visit (INDEPENDENT_AMBULATORY_CARE_PROVIDER_SITE_OTHER): Payer: Medicare HMO | Admitting: Neurology

## 2022-10-16 ENCOUNTER — Encounter: Payer: Self-pay | Admitting: Neurology

## 2022-10-16 VITALS — BP 145/63 | HR 68 | Ht 63.0 in

## 2022-10-16 DIAGNOSIS — I69322 Dysarthria following cerebral infarction: Secondary | ICD-10-CM | POA: Diagnosis not present

## 2022-10-16 DIAGNOSIS — S065XAA Traumatic subdural hemorrhage with loss of consciousness status unknown, initial encounter: Secondary | ICD-10-CM | POA: Diagnosis not present

## 2022-10-16 DIAGNOSIS — I69152 Hemiplegia and hemiparesis following nontraumatic intracerebral hemorrhage affecting left dominant side: Secondary | ICD-10-CM | POA: Diagnosis not present

## 2022-10-16 DIAGNOSIS — Z9889 Other specified postprocedural states: Secondary | ICD-10-CM

## 2022-10-16 DIAGNOSIS — I611 Nontraumatic intracerebral hemorrhage in hemisphere, cortical: Secondary | ICD-10-CM | POA: Diagnosis not present

## 2022-10-16 NOTE — Patient Instructions (Signed)
I had a long discussion with the patient and her daughter regarding her brain hemorrhage in January 2024, craniotomy and residual dysarthria and spastic left hemiplegia.  Recommend continue ongoing physical occupational and speech therapy.  Maintain strict control of hypertension with blood pressure goal below 140/90.  Unfortunately she has not made significant improvement and will likely need prolonged care in skilled nursing facility with 24-hour support Return for follow-up in the future only as necessary no scheduled appointment was made.

## 2022-10-16 NOTE — Progress Notes (Signed)
[facility] [address] [place]. [place] [address] [phone]       OFFICE CONSULT NOTE  [name] Date of Birth:  1936/04/20 Medical Record Number:  [id number]   Referring MD:    Reason for Referral: Intracerebral hemorrhage  HPI: Gina Kelley is a 87 year old pleasant African-American lady seen today for initial office consultation visit for intracerebral hemorrhage.  She is accompanied by by her daughter.  History is obtained from them and review of electronic medical records.  I personally reviewed pertinent available imaging films in PACS.  He has past medical history of hypertension, hyperlipidemia, hypertensive retinopathy, diabetes anemia of chronic disease.  She presented on 07/08/2022 to [facility] as a code stroke for sudden onset of aphasia, left hemiplegia, right gaze deviation, left facial droop.  On arrival her systolic blood pressure was in Gina 270s range.  NIH stroke scale was 42.  ICH score was 2.  Ct Scan of Gina head showed a large right frontal parenchymal hemorrhage Centered around Gina operculum 4 x 3.9 x 4.1 with an associated overlying subdural hemorrhage measuring up to 10 mm in thickness in Gina right frontal convexity and 12 mm right to left midline shift.  Blood pressure was controlled with Cleviprex drip and she was intubated.  Surgery was consulted Kelley for emergent craniotomy for clot evacuation and subdural evacuation.  EEG showed continuous slow generalized slowing in Gina right frontotemporal region but no definite seizure activity.  Kelley was in Gina neurological ICU and gradually did better and was extubated.  He hemiplegia and dysphagia.  He is transferred to inpatient rehab and stayed there from 08/02/22 to 08/22/2022.  She required PEG feeding tube.  She was transferred to skilled nursing facility due to lack of significant improvement.  He is currently at [facility].  She is getting physical occupational and  speech therapy every day.  Her speech has improved and be understood with some difficulty though she still has some dysarthria.  Still using tobacco Gina last week.  She is still using PEG tube though last week she started using some pure diet.  She is tolerating well so far.  She still has significant left hemiplegia with practically no movement in Gina left arm.  He is able to move Gina left leg but it is still weak and she is unable to bear weight or even stand or walk even with Gina help of Gina therapist.  Family is now thinking more about Gina long-term nursing home placement.  Blood pressure seems better controlled today it is 145/63. ROS:   14 system review of systems is positive for dysarthria, dysphagia, weakness, difficulty walking, wheelchair-bound all other systems negative  PMH:  Past Medical History:  Diagnosis Date   Anemia of chronic disease 07/08/2006   Antral ulcer 02/23/2007   Seen on EGD in 2008, small ulcer with erosion    Cerebral vascular accident 04/14/2006   1998, left lower extremity numbness, no residual deficits    Essential hypertension 04/14/2006   Gastroesophageal reflux disease 02/18/2013   Occasional, symptomatically relieved with peptobismol    Healthcare maintenance 12/04/2011   Hyperlipidemia LDL goal < 100 04/14/2006   Hypertensive retinopathy of both eyes, grade 1 05/15/2016   Osteopenia of right femoral neck 10/20/2016   DEXA (10/16/2016): R femur T -2.5 (FRAX tool calculates at -2.4), L1-L4 spine T -0.9, 10 year risk for: Major osteoporotic fracture 8.3%, Hip fracture 2.7%   Type 2 diabetes mellitus with stage  1 chronic kidney disease, without long-term current use of insulin    Type II diabetes mellitus 04/14/2006    Social History:  Social History   Socioeconomic History   Marital status: Married    Spouse name: Not on file   Number of children: Not on file   Years of education: Not on file   Highest education level: Not on file  Occupational  History   Occupation: Retired     Comment: [employer]  Tobacco Use   Smoking status: Former    Types: Cigarettes    Quit date: 06/30/1978    Years since quitting: 44.3   Smokeless tobacco: Never  Vaping Use   Vaping Use: Never used  Substance and Sexual Activity   Alcohol use: No   Drug use: No   Sexual activity: Not Currently  Other Topics Concern   Not on file  Social History Narrative   Current Social History 09/07/2020        Kelley lives with husband in one level home with 5 outside steps with handrails on both sides        Kelley's method of transportation is personal car (drives herself)      Gina highest level of education was 9 th grade      Gina Kelley currently retired from [employer] (due to [employer]).      Identified important Relationships are, "My family"       Pets : None       Interests / Fun: "Go to Gina [employer], sing, read. Went to gym before [employer]."       Current Stressors: "I don't have any. I sing it away."       Religious / Personal Beliefs: "I believe in my Lord and Savior, [employer], Gina son of Gina [employer]."       Other: "I like to follow Gina rules and regulations." (Covid guidelines) "I'm easy to get along with."      L. Ducatte, BSN, RN-BC       Social Determinants of Health   Financial Resource Strain: Not on file  Food Insecurity: Not on file  Transportation Needs: Not on file  Physical Activity: Not on file  Stress: Not on file  Social Connections: Not on file  Intimate Partner Violence: Not on file    Medications:   Current Outpatient Medications on File Prior to Visit  Medication Sig Dispense Refill   acetaminophen (TYLENOL) 160 MG/5ML solution Place 20.3 mLs (650 mg total) into feeding tube every 8 (eight) hours. 120 mL 0   amLODipine (NORVASC) 10 MG tablet Place 1 tablet (10 mg total) into feeding tube daily.     ascorbic acid (VITAMIN C) 500 MG tablet Place 1 tablet (500 mg total) into feeding tube daily.     atorvastatin  (LIPITOR) 80 MG tablet Place 1 tablet (80 mg total) into feeding tube daily.     carvedilol (COREG) 6.25 MG tablet Place 1 tablet (6.25 mg total) into feeding tube 2 (two) times daily with a meal.     diclofenac Sodium (VOLTAREN) 1 % GEL Apply 2 g topically 4 (four) times daily.     ezetimibe (ZETIA) 10 MG tablet Place 1 tablet (10 mg total) into feeding tube daily.     famotidine (PEPCID) 20 MG tablet Place 1 tablet (20 mg total) into feeding tube 2 (two) times daily.     ferrous sulfate 300 (60 Fe) MG/5ML syrup Place 5 mLs (300 mg total) into feeding  tube daily with breakfast. 150 mL 3   FLUoxetine (PROZAC) 20 MG capsule Place 1 capsule (20 mg total) into feeding tube at bedtime.  3   gabapentin (NEURONTIN) 250 MG/5ML solution Place 2 mLs (100 mg total) into feeding tube every 8 (eight) hours.  12   insulin aspart (NOVOLOG) 100 UNIT/ML injection Inject 0-15 Units into Gina skin 3 (three) times daily with meals. 10 mL 11   insulin aspart (NOVOLOG) 100 UNIT/ML injection Inject 0-5 Units into Gina skin at bedtime. 10 mL 11   l-methylfolate-B6-B12 (METANX) 3-35-2 MG TABS tablet Place 1 tablet into feeding tube daily. 60 tablet    lidocaine (LIDODERM) 5 % Place 1 patch onto Gina skin daily. Remove & Discard patch within 12 hours or as directed by MD 30 patch 0   metFORMIN (GLUCOPHAGE) 500 MG tablet Place 1 tablet (500 mg total) into feeding tube 2 (two) times daily with a meal.     nutrition supplement, JUVEN, (JUVEN) PACK Place 1 packet into feeding tube 2 (two) times daily between meals.  0   Nutritional Supplements (FEEDING SUPPLEMENT, GLUCERNA 1.5 CAL,) LIQD Place 237 mLs into feeding tube 5 (five) times daily.     nystatin (MYCOSTATIN) 100000 UNIT/ML suspension Use as directed 5 mLs (500,000 Units total) in Gina mouth or throat 4 (four) times daily. 60 mL 0   polyethylene glycol (MIRALAX / GLYCOLAX) 17 g packet Place 17 g into feeding tube daily as needed for mild constipation. 14 each 0   polyvinyl  alcohol (LIQUIFILM TEARS) 1.4 % ophthalmic solution Place 1 drop into both eyes as needed for dry eyes. 15 mL 0   Water For Irrigation, Sterile (FREE WATER) SOLN Place 120 mLs into feeding tube 5 (five) times daily.     No current facility-administered medications on file prior to visit.    Allergies:   Allergies  Allergen Reactions   Enalapril Cough   Losartan Other (See Comments)    angioedema   Chlorhexidine Swelling    Possible irritation from CHG, eye tongue and lip swelling    Physical Exam General: Frail elderly African-American lady, seated, in no evident distress Head: head normocephalic and atraumatic.   Neck: supple with no carotid or supraclavicular bruits Cardiovascular: regular rate and rhythm, no murmurs Musculoskeletal: no deformity Skin:  no rash/petichiae Vascular:  Normal pulses all extremities  Neurologic Exam Mental Status: Awake and fully alert. Oriented to place but not to time.  Severe dysarthria but can be understood with some difficulty.  Follows simple midline and 1 and two-step commands.   Cranial Nerves: Fundoscopic exam reveals sharp disc margins. Pupils equal, briskly reactive to light. Extraocular movements full without nystagmus. Visual fields full to confrontation. Hearing intact. Facial sensation intact. Face, tongue, palate moves normally and symmetrically.  Motor: Spastic left hemiplegia with left upper extremity 1/5 strength with only movement on Gina shoulder.  Left lower extremity 3/5 strength with grip.  Tone is increased on Gina left.  Normal strength on Gina right side. Sensory.: intact to touch , pinprick , position and vibratory sensation.  Coordination: Normal on Gina right and untestable on Gina left.   Gait and Station: Deferred as Kelley is unable to walk even with Gina therapist Reflexes: 2+ and asymmetric and brisker on Gina left. Toes downgoing.   NIHSS  13 Modified Rankin  4   ASSESSMENT: 87 year old African-American lady with right  frontal parenchymal and subdural hemorrhage in January 2024 status postcraniotomy but with significant residual spastic left hemiplegia  dysarthria and dysphagia.  Etiology likely uncontrolled HT     PLAN:I had a long discussion with Gina Kelley and her daughter regarding her brain hemorrhage in January 2024, craniotomy and residual dysarthria and spastic left hemiplegia.  Recommend continue ongoing physical occupational and speech therapy.  Maintain strict control of hypertension with blood pressure goal below 140/90.  Unfortunately she has not made significant improvement and will likely need prolonged care in skilled nursing facility with 24-hour support Return for follow-up in Gina future only as necessary no scheduled appointment was made. Greater than 50% time during this 45-minute consultation visit was spent on counseling and coordination of care about her intracerebral hemorrhage and residual spastic hemiplegia Delia Heady, MD Note: This document was prepared with digital dictation and possible smart phrase technology. Any transcriptional errors that result from this process are unintentional.

## 2022-10-17 DIAGNOSIS — M6281 Muscle weakness (generalized): Secondary | ICD-10-CM | POA: Diagnosis not present

## 2022-10-17 DIAGNOSIS — R1312 Dysphagia, oropharyngeal phase: Secondary | ICD-10-CM | POA: Diagnosis not present

## 2022-10-17 DIAGNOSIS — I69891 Dysphagia following other cerebrovascular disease: Secondary | ICD-10-CM | POA: Diagnosis not present

## 2022-10-17 DIAGNOSIS — I69354 Hemiplegia and hemiparesis following cerebral infarction affecting left non-dominant side: Secondary | ICD-10-CM | POA: Diagnosis not present

## 2022-10-17 DIAGNOSIS — R471 Dysarthria and anarthria: Secondary | ICD-10-CM | POA: Diagnosis not present

## 2022-10-17 DIAGNOSIS — R41841 Cognitive communication deficit: Secondary | ICD-10-CM | POA: Diagnosis not present

## 2022-10-18 DIAGNOSIS — M6281 Muscle weakness (generalized): Secondary | ICD-10-CM | POA: Diagnosis not present

## 2022-10-18 DIAGNOSIS — R1312 Dysphagia, oropharyngeal phase: Secondary | ICD-10-CM | POA: Diagnosis not present

## 2022-10-18 DIAGNOSIS — I69354 Hemiplegia and hemiparesis following cerebral infarction affecting left non-dominant side: Secondary | ICD-10-CM | POA: Diagnosis not present

## 2022-10-18 DIAGNOSIS — R41841 Cognitive communication deficit: Secondary | ICD-10-CM | POA: Diagnosis not present

## 2022-10-18 DIAGNOSIS — R471 Dysarthria and anarthria: Secondary | ICD-10-CM | POA: Diagnosis not present

## 2022-10-19 DIAGNOSIS — R1312 Dysphagia, oropharyngeal phase: Secondary | ICD-10-CM | POA: Diagnosis not present

## 2022-10-19 DIAGNOSIS — R41841 Cognitive communication deficit: Secondary | ICD-10-CM | POA: Diagnosis not present

## 2022-10-19 DIAGNOSIS — M6281 Muscle weakness (generalized): Secondary | ICD-10-CM | POA: Diagnosis not present

## 2022-10-19 DIAGNOSIS — R471 Dysarthria and anarthria: Secondary | ICD-10-CM | POA: Diagnosis not present

## 2022-10-19 DIAGNOSIS — I69354 Hemiplegia and hemiparesis following cerebral infarction affecting left non-dominant side: Secondary | ICD-10-CM | POA: Diagnosis not present

## 2022-10-20 DIAGNOSIS — E441 Mild protein-calorie malnutrition: Secondary | ICD-10-CM | POA: Diagnosis not present

## 2022-10-20 DIAGNOSIS — R471 Dysarthria and anarthria: Secondary | ICD-10-CM | POA: Diagnosis not present

## 2022-10-20 DIAGNOSIS — R1312 Dysphagia, oropharyngeal phase: Secondary | ICD-10-CM | POA: Diagnosis not present

## 2022-10-20 DIAGNOSIS — L22 Diaper dermatitis: Secondary | ICD-10-CM | POA: Diagnosis not present

## 2022-10-20 DIAGNOSIS — D509 Iron deficiency anemia, unspecified: Secondary | ICD-10-CM | POA: Diagnosis not present

## 2022-10-20 DIAGNOSIS — E119 Type 2 diabetes mellitus without complications: Secondary | ICD-10-CM | POA: Diagnosis not present

## 2022-10-20 DIAGNOSIS — I69354 Hemiplegia and hemiparesis following cerebral infarction affecting left non-dominant side: Secondary | ICD-10-CM | POA: Diagnosis not present

## 2022-10-20 DIAGNOSIS — M6281 Muscle weakness (generalized): Secondary | ICD-10-CM | POA: Diagnosis not present

## 2022-10-20 DIAGNOSIS — R41841 Cognitive communication deficit: Secondary | ICD-10-CM | POA: Diagnosis not present

## 2022-10-22 DIAGNOSIS — R471 Dysarthria and anarthria: Secondary | ICD-10-CM | POA: Diagnosis not present

## 2022-10-22 DIAGNOSIS — I69354 Hemiplegia and hemiparesis following cerebral infarction affecting left non-dominant side: Secondary | ICD-10-CM | POA: Diagnosis not present

## 2022-10-22 DIAGNOSIS — R41841 Cognitive communication deficit: Secondary | ICD-10-CM | POA: Diagnosis not present

## 2022-10-22 DIAGNOSIS — H353211 Exudative age-related macular degeneration, right eye, with active choroidal neovascularization: Secondary | ICD-10-CM | POA: Diagnosis not present

## 2022-10-22 DIAGNOSIS — R1312 Dysphagia, oropharyngeal phase: Secondary | ICD-10-CM | POA: Diagnosis not present

## 2022-10-22 DIAGNOSIS — M6281 Muscle weakness (generalized): Secondary | ICD-10-CM | POA: Diagnosis not present

## 2022-10-22 DIAGNOSIS — H353222 Exudative age-related macular degeneration, left eye, with inactive choroidal neovascularization: Secondary | ICD-10-CM | POA: Diagnosis not present

## 2022-10-23 DIAGNOSIS — F339 Major depressive disorder, recurrent, unspecified: Secondary | ICD-10-CM | POA: Diagnosis not present

## 2022-10-23 DIAGNOSIS — R41841 Cognitive communication deficit: Secondary | ICD-10-CM | POA: Diagnosis not present

## 2022-10-23 DIAGNOSIS — I69354 Hemiplegia and hemiparesis following cerebral infarction affecting left non-dominant side: Secondary | ICD-10-CM | POA: Diagnosis not present

## 2022-10-23 DIAGNOSIS — R471 Dysarthria and anarthria: Secondary | ICD-10-CM | POA: Diagnosis not present

## 2022-10-23 DIAGNOSIS — G894 Chronic pain syndrome: Secondary | ICD-10-CM | POA: Diagnosis not present

## 2022-10-23 DIAGNOSIS — F419 Anxiety disorder, unspecified: Secondary | ICD-10-CM | POA: Diagnosis not present

## 2022-10-23 DIAGNOSIS — R1312 Dysphagia, oropharyngeal phase: Secondary | ICD-10-CM | POA: Diagnosis not present

## 2022-10-23 DIAGNOSIS — M6281 Muscle weakness (generalized): Secondary | ICD-10-CM | POA: Diagnosis not present

## 2022-10-24 DIAGNOSIS — R1110 Vomiting, unspecified: Secondary | ICD-10-CM | POA: Diagnosis not present

## 2022-10-24 DIAGNOSIS — I69891 Dysphagia following other cerebrovascular disease: Secondary | ICD-10-CM | POA: Diagnosis not present

## 2022-10-25 DIAGNOSIS — M6281 Muscle weakness (generalized): Secondary | ICD-10-CM | POA: Diagnosis not present

## 2022-10-25 DIAGNOSIS — R1312 Dysphagia, oropharyngeal phase: Secondary | ICD-10-CM | POA: Diagnosis not present

## 2022-10-25 DIAGNOSIS — I69354 Hemiplegia and hemiparesis following cerebral infarction affecting left non-dominant side: Secondary | ICD-10-CM | POA: Diagnosis not present

## 2022-10-25 DIAGNOSIS — R471 Dysarthria and anarthria: Secondary | ICD-10-CM | POA: Diagnosis not present

## 2022-10-25 DIAGNOSIS — R41841 Cognitive communication deficit: Secondary | ICD-10-CM | POA: Diagnosis not present

## 2022-10-27 DIAGNOSIS — R1110 Vomiting, unspecified: Secondary | ICD-10-CM | POA: Diagnosis not present

## 2022-10-27 DIAGNOSIS — R41841 Cognitive communication deficit: Secondary | ICD-10-CM | POA: Diagnosis not present

## 2022-10-27 DIAGNOSIS — R1312 Dysphagia, oropharyngeal phase: Secondary | ICD-10-CM | POA: Diagnosis not present

## 2022-10-27 DIAGNOSIS — I69891 Dysphagia following other cerebrovascular disease: Secondary | ICD-10-CM | POA: Diagnosis not present

## 2022-10-27 DIAGNOSIS — E46 Unspecified protein-calorie malnutrition: Secondary | ICD-10-CM | POA: Diagnosis not present

## 2022-10-27 DIAGNOSIS — M6281 Muscle weakness (generalized): Secondary | ICD-10-CM | POA: Diagnosis not present

## 2022-10-27 DIAGNOSIS — R471 Dysarthria and anarthria: Secondary | ICD-10-CM | POA: Diagnosis not present

## 2022-10-27 DIAGNOSIS — I69354 Hemiplegia and hemiparesis following cerebral infarction affecting left non-dominant side: Secondary | ICD-10-CM | POA: Diagnosis not present

## 2022-10-28 DIAGNOSIS — M6281 Muscle weakness (generalized): Secondary | ICD-10-CM | POA: Diagnosis not present

## 2022-10-28 DIAGNOSIS — R1312 Dysphagia, oropharyngeal phase: Secondary | ICD-10-CM | POA: Diagnosis not present

## 2022-10-28 DIAGNOSIS — R41841 Cognitive communication deficit: Secondary | ICD-10-CM | POA: Diagnosis not present

## 2022-10-28 DIAGNOSIS — I69354 Hemiplegia and hemiparesis following cerebral infarction affecting left non-dominant side: Secondary | ICD-10-CM | POA: Diagnosis not present

## 2022-10-28 DIAGNOSIS — R471 Dysarthria and anarthria: Secondary | ICD-10-CM | POA: Diagnosis not present

## 2022-10-29 DIAGNOSIS — R1312 Dysphagia, oropharyngeal phase: Secondary | ICD-10-CM | POA: Diagnosis not present

## 2022-10-29 DIAGNOSIS — I69354 Hemiplegia and hemiparesis following cerebral infarction affecting left non-dominant side: Secondary | ICD-10-CM | POA: Diagnosis not present

## 2022-10-29 DIAGNOSIS — R41841 Cognitive communication deficit: Secondary | ICD-10-CM | POA: Diagnosis not present

## 2022-10-29 DIAGNOSIS — R471 Dysarthria and anarthria: Secondary | ICD-10-CM | POA: Diagnosis not present

## 2022-10-29 DIAGNOSIS — R509 Fever, unspecified: Secondary | ICD-10-CM | POA: Diagnosis not present

## 2022-10-29 DIAGNOSIS — D649 Anemia, unspecified: Secondary | ICD-10-CM | POA: Diagnosis not present

## 2022-10-29 DIAGNOSIS — M6281 Muscle weakness (generalized): Secondary | ICD-10-CM | POA: Diagnosis not present

## 2022-10-30 DIAGNOSIS — R41841 Cognitive communication deficit: Secondary | ICD-10-CM | POA: Diagnosis not present

## 2022-10-30 DIAGNOSIS — R1312 Dysphagia, oropharyngeal phase: Secondary | ICD-10-CM | POA: Diagnosis not present

## 2022-10-30 DIAGNOSIS — M6281 Muscle weakness (generalized): Secondary | ICD-10-CM | POA: Diagnosis not present

## 2022-10-30 DIAGNOSIS — L22 Diaper dermatitis: Secondary | ICD-10-CM | POA: Diagnosis not present

## 2022-10-30 DIAGNOSIS — D509 Iron deficiency anemia, unspecified: Secondary | ICD-10-CM | POA: Diagnosis not present

## 2022-10-30 DIAGNOSIS — E441 Mild protein-calorie malnutrition: Secondary | ICD-10-CM | POA: Diagnosis not present

## 2022-10-30 DIAGNOSIS — I69354 Hemiplegia and hemiparesis following cerebral infarction affecting left non-dominant side: Secondary | ICD-10-CM | POA: Diagnosis not present

## 2022-10-30 DIAGNOSIS — E119 Type 2 diabetes mellitus without complications: Secondary | ICD-10-CM | POA: Diagnosis not present

## 2022-10-30 DIAGNOSIS — R471 Dysarthria and anarthria: Secondary | ICD-10-CM | POA: Diagnosis not present

## 2022-11-01 DIAGNOSIS — R1312 Dysphagia, oropharyngeal phase: Secondary | ICD-10-CM | POA: Diagnosis not present

## 2022-11-01 DIAGNOSIS — M6281 Muscle weakness (generalized): Secondary | ICD-10-CM | POA: Diagnosis not present

## 2022-11-01 DIAGNOSIS — I69354 Hemiplegia and hemiparesis following cerebral infarction affecting left non-dominant side: Secondary | ICD-10-CM | POA: Diagnosis not present

## 2022-11-01 DIAGNOSIS — R41841 Cognitive communication deficit: Secondary | ICD-10-CM | POA: Diagnosis not present

## 2022-11-01 DIAGNOSIS — R471 Dysarthria and anarthria: Secondary | ICD-10-CM | POA: Diagnosis not present

## 2022-11-03 DIAGNOSIS — R7309 Other abnormal glucose: Secondary | ICD-10-CM | POA: Diagnosis not present

## 2022-11-03 DIAGNOSIS — S065XAD Traumatic subdural hemorrhage with loss of consciousness status unknown, subsequent encounter: Secondary | ICD-10-CM | POA: Diagnosis not present

## 2022-11-03 DIAGNOSIS — M6281 Muscle weakness (generalized): Secondary | ICD-10-CM | POA: Diagnosis not present

## 2022-11-03 DIAGNOSIS — K219 Gastro-esophageal reflux disease without esophagitis: Secondary | ICD-10-CM | POA: Diagnosis not present

## 2022-11-03 DIAGNOSIS — R197 Diarrhea, unspecified: Secondary | ICD-10-CM | POA: Diagnosis not present

## 2022-11-03 DIAGNOSIS — R41841 Cognitive communication deficit: Secondary | ICD-10-CM | POA: Diagnosis not present

## 2022-11-03 DIAGNOSIS — R1312 Dysphagia, oropharyngeal phase: Secondary | ICD-10-CM | POA: Diagnosis not present

## 2022-11-03 DIAGNOSIS — I619 Nontraumatic intracerebral hemorrhage, unspecified: Secondary | ICD-10-CM | POA: Diagnosis not present

## 2022-11-03 DIAGNOSIS — I69354 Hemiplegia and hemiparesis following cerebral infarction affecting left non-dominant side: Secondary | ICD-10-CM | POA: Diagnosis not present

## 2022-11-03 DIAGNOSIS — R471 Dysarthria and anarthria: Secondary | ICD-10-CM | POA: Diagnosis not present

## 2022-11-03 DIAGNOSIS — I69891 Dysphagia following other cerebrovascular disease: Secondary | ICD-10-CM | POA: Diagnosis not present

## 2022-11-04 DIAGNOSIS — I69891 Dysphagia following other cerebrovascular disease: Secondary | ICD-10-CM | POA: Diagnosis not present

## 2022-11-04 DIAGNOSIS — I69354 Hemiplegia and hemiparesis following cerebral infarction affecting left non-dominant side: Secondary | ICD-10-CM | POA: Diagnosis not present

## 2022-11-04 DIAGNOSIS — Z931 Gastrostomy status: Secondary | ICD-10-CM | POA: Diagnosis not present

## 2022-11-06 DIAGNOSIS — E785 Hyperlipidemia, unspecified: Secondary | ICD-10-CM | POA: Diagnosis not present

## 2022-11-07 DIAGNOSIS — S065XAD Traumatic subdural hemorrhage with loss of consciousness status unknown, subsequent encounter: Secondary | ICD-10-CM | POA: Diagnosis not present

## 2022-11-07 DIAGNOSIS — M6281 Muscle weakness (generalized): Secondary | ICD-10-CM | POA: Diagnosis not present

## 2022-11-07 DIAGNOSIS — I69828 Other speech and language deficits following other cerebrovascular disease: Secondary | ICD-10-CM | POA: Diagnosis not present

## 2022-11-07 DIAGNOSIS — R1312 Dysphagia, oropharyngeal phase: Secondary | ICD-10-CM | POA: Diagnosis not present

## 2022-11-07 DIAGNOSIS — R41841 Cognitive communication deficit: Secondary | ICD-10-CM | POA: Diagnosis not present

## 2022-11-07 DIAGNOSIS — I69891 Dysphagia following other cerebrovascular disease: Secondary | ICD-10-CM | POA: Diagnosis not present

## 2022-11-08 DIAGNOSIS — I69891 Dysphagia following other cerebrovascular disease: Secondary | ICD-10-CM | POA: Diagnosis not present

## 2022-11-08 DIAGNOSIS — I69828 Other speech and language deficits following other cerebrovascular disease: Secondary | ICD-10-CM | POA: Diagnosis not present

## 2022-11-08 DIAGNOSIS — R41841 Cognitive communication deficit: Secondary | ICD-10-CM | POA: Diagnosis not present

## 2022-11-08 DIAGNOSIS — M6281 Muscle weakness (generalized): Secondary | ICD-10-CM | POA: Diagnosis not present

## 2022-11-08 DIAGNOSIS — S065XAD Traumatic subdural hemorrhage with loss of consciousness status unknown, subsequent encounter: Secondary | ICD-10-CM | POA: Diagnosis not present

## 2022-11-08 DIAGNOSIS — R1312 Dysphagia, oropharyngeal phase: Secondary | ICD-10-CM | POA: Diagnosis not present

## 2022-11-09 DIAGNOSIS — R1312 Dysphagia, oropharyngeal phase: Secondary | ICD-10-CM | POA: Diagnosis not present

## 2022-11-09 DIAGNOSIS — I69891 Dysphagia following other cerebrovascular disease: Secondary | ICD-10-CM | POA: Diagnosis not present

## 2022-11-09 DIAGNOSIS — I69828 Other speech and language deficits following other cerebrovascular disease: Secondary | ICD-10-CM | POA: Diagnosis not present

## 2022-11-09 DIAGNOSIS — M6281 Muscle weakness (generalized): Secondary | ICD-10-CM | POA: Diagnosis not present

## 2022-11-09 DIAGNOSIS — R41841 Cognitive communication deficit: Secondary | ICD-10-CM | POA: Diagnosis not present

## 2022-11-09 DIAGNOSIS — S065XAD Traumatic subdural hemorrhage with loss of consciousness status unknown, subsequent encounter: Secondary | ICD-10-CM | POA: Diagnosis not present

## 2022-11-10 DIAGNOSIS — S065XAD Traumatic subdural hemorrhage with loss of consciousness status unknown, subsequent encounter: Secondary | ICD-10-CM | POA: Diagnosis not present

## 2022-11-10 DIAGNOSIS — E1165 Type 2 diabetes mellitus with hyperglycemia: Secondary | ICD-10-CM | POA: Diagnosis not present

## 2022-11-10 DIAGNOSIS — E785 Hyperlipidemia, unspecified: Secondary | ICD-10-CM | POA: Diagnosis not present

## 2022-11-10 DIAGNOSIS — I69828 Other speech and language deficits following other cerebrovascular disease: Secondary | ICD-10-CM | POA: Diagnosis not present

## 2022-11-10 DIAGNOSIS — M6281 Muscle weakness (generalized): Secondary | ICD-10-CM | POA: Diagnosis not present

## 2022-11-10 DIAGNOSIS — K219 Gastro-esophageal reflux disease without esophagitis: Secondary | ICD-10-CM | POA: Diagnosis not present

## 2022-11-10 DIAGNOSIS — R1312 Dysphagia, oropharyngeal phase: Secondary | ICD-10-CM | POA: Diagnosis not present

## 2022-11-10 DIAGNOSIS — I10 Essential (primary) hypertension: Secondary | ICD-10-CM | POA: Diagnosis not present

## 2022-11-10 DIAGNOSIS — R41841 Cognitive communication deficit: Secondary | ICD-10-CM | POA: Diagnosis not present

## 2022-11-10 DIAGNOSIS — I69891 Dysphagia following other cerebrovascular disease: Secondary | ICD-10-CM | POA: Diagnosis not present

## 2022-11-11 DIAGNOSIS — I69828 Other speech and language deficits following other cerebrovascular disease: Secondary | ICD-10-CM | POA: Diagnosis not present

## 2022-11-11 DIAGNOSIS — I69891 Dysphagia following other cerebrovascular disease: Secondary | ICD-10-CM | POA: Diagnosis not present

## 2022-11-11 DIAGNOSIS — R1312 Dysphagia, oropharyngeal phase: Secondary | ICD-10-CM | POA: Diagnosis not present

## 2022-11-11 DIAGNOSIS — M6281 Muscle weakness (generalized): Secondary | ICD-10-CM | POA: Diagnosis not present

## 2022-11-11 DIAGNOSIS — R41841 Cognitive communication deficit: Secondary | ICD-10-CM | POA: Diagnosis not present

## 2022-11-11 DIAGNOSIS — S065XAD Traumatic subdural hemorrhage with loss of consciousness status unknown, subsequent encounter: Secondary | ICD-10-CM | POA: Diagnosis not present

## 2022-11-12 DIAGNOSIS — S065XAD Traumatic subdural hemorrhage with loss of consciousness status unknown, subsequent encounter: Secondary | ICD-10-CM | POA: Diagnosis not present

## 2022-11-12 DIAGNOSIS — I69828 Other speech and language deficits following other cerebrovascular disease: Secondary | ICD-10-CM | POA: Diagnosis not present

## 2022-11-12 DIAGNOSIS — I69891 Dysphagia following other cerebrovascular disease: Secondary | ICD-10-CM | POA: Diagnosis not present

## 2022-11-12 DIAGNOSIS — R1312 Dysphagia, oropharyngeal phase: Secondary | ICD-10-CM | POA: Diagnosis not present

## 2022-11-12 DIAGNOSIS — M6281 Muscle weakness (generalized): Secondary | ICD-10-CM | POA: Diagnosis not present

## 2022-11-12 DIAGNOSIS — R41841 Cognitive communication deficit: Secondary | ICD-10-CM | POA: Diagnosis not present

## 2022-11-13 DIAGNOSIS — R1312 Dysphagia, oropharyngeal phase: Secondary | ICD-10-CM | POA: Diagnosis not present

## 2022-11-13 DIAGNOSIS — R41841 Cognitive communication deficit: Secondary | ICD-10-CM | POA: Diagnosis not present

## 2022-11-13 DIAGNOSIS — I69828 Other speech and language deficits following other cerebrovascular disease: Secondary | ICD-10-CM | POA: Diagnosis not present

## 2022-11-13 DIAGNOSIS — I69891 Dysphagia following other cerebrovascular disease: Secondary | ICD-10-CM | POA: Diagnosis not present

## 2022-11-13 DIAGNOSIS — S065XAD Traumatic subdural hemorrhage with loss of consciousness status unknown, subsequent encounter: Secondary | ICD-10-CM | POA: Diagnosis not present

## 2022-11-13 DIAGNOSIS — M6281 Muscle weakness (generalized): Secondary | ICD-10-CM | POA: Diagnosis not present

## 2022-11-14 DIAGNOSIS — R41841 Cognitive communication deficit: Secondary | ICD-10-CM | POA: Diagnosis not present

## 2022-11-14 DIAGNOSIS — I69828 Other speech and language deficits following other cerebrovascular disease: Secondary | ICD-10-CM | POA: Diagnosis not present

## 2022-11-14 DIAGNOSIS — E785 Hyperlipidemia, unspecified: Secondary | ICD-10-CM | POA: Diagnosis not present

## 2022-11-14 DIAGNOSIS — S065XAD Traumatic subdural hemorrhage with loss of consciousness status unknown, subsequent encounter: Secondary | ICD-10-CM | POA: Diagnosis not present

## 2022-11-14 DIAGNOSIS — I10 Essential (primary) hypertension: Secondary | ICD-10-CM | POA: Diagnosis not present

## 2022-11-14 DIAGNOSIS — I69891 Dysphagia following other cerebrovascular disease: Secondary | ICD-10-CM | POA: Diagnosis not present

## 2022-11-14 DIAGNOSIS — R1312 Dysphagia, oropharyngeal phase: Secondary | ICD-10-CM | POA: Diagnosis not present

## 2022-11-14 DIAGNOSIS — E1165 Type 2 diabetes mellitus with hyperglycemia: Secondary | ICD-10-CM | POA: Diagnosis not present

## 2022-11-14 DIAGNOSIS — M6281 Muscle weakness (generalized): Secondary | ICD-10-CM | POA: Diagnosis not present

## 2022-11-15 DIAGNOSIS — S065XAD Traumatic subdural hemorrhage with loss of consciousness status unknown, subsequent encounter: Secondary | ICD-10-CM | POA: Diagnosis not present

## 2022-11-15 DIAGNOSIS — I69891 Dysphagia following other cerebrovascular disease: Secondary | ICD-10-CM | POA: Diagnosis not present

## 2022-11-15 DIAGNOSIS — R1312 Dysphagia, oropharyngeal phase: Secondary | ICD-10-CM | POA: Diagnosis not present

## 2022-11-15 DIAGNOSIS — I69828 Other speech and language deficits following other cerebrovascular disease: Secondary | ICD-10-CM | POA: Diagnosis not present

## 2022-11-15 DIAGNOSIS — M6281 Muscle weakness (generalized): Secondary | ICD-10-CM | POA: Diagnosis not present

## 2022-11-15 DIAGNOSIS — R41841 Cognitive communication deficit: Secondary | ICD-10-CM | POA: Diagnosis not present

## 2022-11-16 DIAGNOSIS — I69891 Dysphagia following other cerebrovascular disease: Secondary | ICD-10-CM | POA: Diagnosis not present

## 2022-11-16 DIAGNOSIS — R1312 Dysphagia, oropharyngeal phase: Secondary | ICD-10-CM | POA: Diagnosis not present

## 2022-11-16 DIAGNOSIS — M6281 Muscle weakness (generalized): Secondary | ICD-10-CM | POA: Diagnosis not present

## 2022-11-16 DIAGNOSIS — R41841 Cognitive communication deficit: Secondary | ICD-10-CM | POA: Diagnosis not present

## 2022-11-16 DIAGNOSIS — I69828 Other speech and language deficits following other cerebrovascular disease: Secondary | ICD-10-CM | POA: Diagnosis not present

## 2022-11-16 DIAGNOSIS — S065XAD Traumatic subdural hemorrhage with loss of consciousness status unknown, subsequent encounter: Secondary | ICD-10-CM | POA: Diagnosis not present

## 2022-11-17 DIAGNOSIS — S065XAD Traumatic subdural hemorrhage with loss of consciousness status unknown, subsequent encounter: Secondary | ICD-10-CM | POA: Diagnosis not present

## 2022-11-17 DIAGNOSIS — R41841 Cognitive communication deficit: Secondary | ICD-10-CM | POA: Diagnosis not present

## 2022-11-17 DIAGNOSIS — I69828 Other speech and language deficits following other cerebrovascular disease: Secondary | ICD-10-CM | POA: Diagnosis not present

## 2022-11-17 DIAGNOSIS — I69891 Dysphagia following other cerebrovascular disease: Secondary | ICD-10-CM | POA: Diagnosis not present

## 2022-11-17 DIAGNOSIS — E1165 Type 2 diabetes mellitus with hyperglycemia: Secondary | ICD-10-CM | POA: Diagnosis not present

## 2022-11-17 DIAGNOSIS — R1312 Dysphagia, oropharyngeal phase: Secondary | ICD-10-CM | POA: Diagnosis not present

## 2022-11-17 DIAGNOSIS — M6281 Muscle weakness (generalized): Secondary | ICD-10-CM | POA: Diagnosis not present

## 2022-11-17 DIAGNOSIS — E785 Hyperlipidemia, unspecified: Secondary | ICD-10-CM | POA: Diagnosis not present

## 2022-11-17 DIAGNOSIS — I10 Essential (primary) hypertension: Secondary | ICD-10-CM | POA: Diagnosis not present

## 2022-11-18 DIAGNOSIS — I69891 Dysphagia following other cerebrovascular disease: Secondary | ICD-10-CM | POA: Diagnosis not present

## 2022-11-18 DIAGNOSIS — R1312 Dysphagia, oropharyngeal phase: Secondary | ICD-10-CM | POA: Diagnosis not present

## 2022-11-18 DIAGNOSIS — I69828 Other speech and language deficits following other cerebrovascular disease: Secondary | ICD-10-CM | POA: Diagnosis not present

## 2022-11-18 DIAGNOSIS — M6281 Muscle weakness (generalized): Secondary | ICD-10-CM | POA: Diagnosis not present

## 2022-11-18 DIAGNOSIS — R41841 Cognitive communication deficit: Secondary | ICD-10-CM | POA: Diagnosis not present

## 2022-11-18 DIAGNOSIS — S065XAD Traumatic subdural hemorrhage with loss of consciousness status unknown, subsequent encounter: Secondary | ICD-10-CM | POA: Diagnosis not present

## 2022-11-19 DIAGNOSIS — I69828 Other speech and language deficits following other cerebrovascular disease: Secondary | ICD-10-CM | POA: Diagnosis not present

## 2022-11-19 DIAGNOSIS — M6281 Muscle weakness (generalized): Secondary | ICD-10-CM | POA: Diagnosis not present

## 2022-11-19 DIAGNOSIS — I69891 Dysphagia following other cerebrovascular disease: Secondary | ICD-10-CM | POA: Diagnosis not present

## 2022-11-19 DIAGNOSIS — R1312 Dysphagia, oropharyngeal phase: Secondary | ICD-10-CM | POA: Diagnosis not present

## 2022-11-19 DIAGNOSIS — R41841 Cognitive communication deficit: Secondary | ICD-10-CM | POA: Diagnosis not present

## 2022-11-19 DIAGNOSIS — S065XAD Traumatic subdural hemorrhage with loss of consciousness status unknown, subsequent encounter: Secondary | ICD-10-CM | POA: Diagnosis not present

## 2022-11-20 DIAGNOSIS — I69891 Dysphagia following other cerebrovascular disease: Secondary | ICD-10-CM | POA: Diagnosis not present

## 2022-11-20 DIAGNOSIS — R1312 Dysphagia, oropharyngeal phase: Secondary | ICD-10-CM | POA: Diagnosis not present

## 2022-11-20 DIAGNOSIS — S065XAD Traumatic subdural hemorrhage with loss of consciousness status unknown, subsequent encounter: Secondary | ICD-10-CM | POA: Diagnosis not present

## 2022-11-20 DIAGNOSIS — M6281 Muscle weakness (generalized): Secondary | ICD-10-CM | POA: Diagnosis not present

## 2022-11-20 DIAGNOSIS — R41841 Cognitive communication deficit: Secondary | ICD-10-CM | POA: Diagnosis not present

## 2022-11-20 DIAGNOSIS — I69828 Other speech and language deficits following other cerebrovascular disease: Secondary | ICD-10-CM | POA: Diagnosis not present

## 2022-11-21 DIAGNOSIS — I69828 Other speech and language deficits following other cerebrovascular disease: Secondary | ICD-10-CM | POA: Diagnosis not present

## 2022-11-21 DIAGNOSIS — R41841 Cognitive communication deficit: Secondary | ICD-10-CM | POA: Diagnosis not present

## 2022-11-21 DIAGNOSIS — M6281 Muscle weakness (generalized): Secondary | ICD-10-CM | POA: Diagnosis not present

## 2022-11-21 DIAGNOSIS — R1312 Dysphagia, oropharyngeal phase: Secondary | ICD-10-CM | POA: Diagnosis not present

## 2022-11-21 DIAGNOSIS — I69891 Dysphagia following other cerebrovascular disease: Secondary | ICD-10-CM | POA: Diagnosis not present

## 2022-11-21 DIAGNOSIS — S065XAD Traumatic subdural hemorrhage with loss of consciousness status unknown, subsequent encounter: Secondary | ICD-10-CM | POA: Diagnosis not present

## 2022-11-22 DIAGNOSIS — S065XAD Traumatic subdural hemorrhage with loss of consciousness status unknown, subsequent encounter: Secondary | ICD-10-CM | POA: Diagnosis not present

## 2022-11-22 DIAGNOSIS — I69828 Other speech and language deficits following other cerebrovascular disease: Secondary | ICD-10-CM | POA: Diagnosis not present

## 2022-11-22 DIAGNOSIS — I69891 Dysphagia following other cerebrovascular disease: Secondary | ICD-10-CM | POA: Diagnosis not present

## 2022-11-22 DIAGNOSIS — R41841 Cognitive communication deficit: Secondary | ICD-10-CM | POA: Diagnosis not present

## 2022-11-22 DIAGNOSIS — R1312 Dysphagia, oropharyngeal phase: Secondary | ICD-10-CM | POA: Diagnosis not present

## 2022-11-22 DIAGNOSIS — M6281 Muscle weakness (generalized): Secondary | ICD-10-CM | POA: Diagnosis not present

## 2022-11-24 DIAGNOSIS — R1312 Dysphagia, oropharyngeal phase: Secondary | ICD-10-CM | POA: Diagnosis not present

## 2022-11-24 DIAGNOSIS — I69891 Dysphagia following other cerebrovascular disease: Secondary | ICD-10-CM | POA: Diagnosis not present

## 2022-11-24 DIAGNOSIS — R41841 Cognitive communication deficit: Secondary | ICD-10-CM | POA: Diagnosis not present

## 2022-11-24 DIAGNOSIS — I69828 Other speech and language deficits following other cerebrovascular disease: Secondary | ICD-10-CM | POA: Diagnosis not present

## 2022-11-24 DIAGNOSIS — S065XAD Traumatic subdural hemorrhage with loss of consciousness status unknown, subsequent encounter: Secondary | ICD-10-CM | POA: Diagnosis not present

## 2022-11-24 DIAGNOSIS — M6281 Muscle weakness (generalized): Secondary | ICD-10-CM | POA: Diagnosis not present

## 2022-11-25 DIAGNOSIS — I69891 Dysphagia following other cerebrovascular disease: Secondary | ICD-10-CM | POA: Diagnosis not present

## 2022-11-25 DIAGNOSIS — M6281 Muscle weakness (generalized): Secondary | ICD-10-CM | POA: Diagnosis not present

## 2022-11-25 DIAGNOSIS — R1312 Dysphagia, oropharyngeal phase: Secondary | ICD-10-CM | POA: Diagnosis not present

## 2022-11-25 DIAGNOSIS — R41841 Cognitive communication deficit: Secondary | ICD-10-CM | POA: Diagnosis not present

## 2022-11-25 DIAGNOSIS — S065XAD Traumatic subdural hemorrhage with loss of consciousness status unknown, subsequent encounter: Secondary | ICD-10-CM | POA: Diagnosis not present

## 2022-11-25 DIAGNOSIS — I69828 Other speech and language deficits following other cerebrovascular disease: Secondary | ICD-10-CM | POA: Diagnosis not present

## 2022-11-26 DIAGNOSIS — R41841 Cognitive communication deficit: Secondary | ICD-10-CM | POA: Diagnosis not present

## 2022-11-26 DIAGNOSIS — S065XAD Traumatic subdural hemorrhage with loss of consciousness status unknown, subsequent encounter: Secondary | ICD-10-CM | POA: Diagnosis not present

## 2022-11-26 DIAGNOSIS — R1312 Dysphagia, oropharyngeal phase: Secondary | ICD-10-CM | POA: Diagnosis not present

## 2022-11-26 DIAGNOSIS — I69891 Dysphagia following other cerebrovascular disease: Secondary | ICD-10-CM | POA: Diagnosis not present

## 2022-11-26 DIAGNOSIS — M6281 Muscle weakness (generalized): Secondary | ICD-10-CM | POA: Diagnosis not present

## 2022-11-26 DIAGNOSIS — I69828 Other speech and language deficits following other cerebrovascular disease: Secondary | ICD-10-CM | POA: Diagnosis not present

## 2022-11-27 DIAGNOSIS — R41841 Cognitive communication deficit: Secondary | ICD-10-CM | POA: Diagnosis not present

## 2022-11-27 DIAGNOSIS — R1312 Dysphagia, oropharyngeal phase: Secondary | ICD-10-CM | POA: Diagnosis not present

## 2022-11-27 DIAGNOSIS — S065XAD Traumatic subdural hemorrhage with loss of consciousness status unknown, subsequent encounter: Secondary | ICD-10-CM | POA: Diagnosis not present

## 2022-11-27 DIAGNOSIS — I69891 Dysphagia following other cerebrovascular disease: Secondary | ICD-10-CM | POA: Diagnosis not present

## 2022-11-27 DIAGNOSIS — M6281 Muscle weakness (generalized): Secondary | ICD-10-CM | POA: Diagnosis not present

## 2022-11-27 DIAGNOSIS — I69828 Other speech and language deficits following other cerebrovascular disease: Secondary | ICD-10-CM | POA: Diagnosis not present

## 2022-11-28 DIAGNOSIS — I69828 Other speech and language deficits following other cerebrovascular disease: Secondary | ICD-10-CM | POA: Diagnosis not present

## 2022-11-28 DIAGNOSIS — I69891 Dysphagia following other cerebrovascular disease: Secondary | ICD-10-CM | POA: Diagnosis not present

## 2022-11-28 DIAGNOSIS — S065XAD Traumatic subdural hemorrhage with loss of consciousness status unknown, subsequent encounter: Secondary | ICD-10-CM | POA: Diagnosis not present

## 2022-11-28 DIAGNOSIS — R1312 Dysphagia, oropharyngeal phase: Secondary | ICD-10-CM | POA: Diagnosis not present

## 2022-11-28 DIAGNOSIS — M6281 Muscle weakness (generalized): Secondary | ICD-10-CM | POA: Diagnosis not present

## 2022-11-28 DIAGNOSIS — R41841 Cognitive communication deficit: Secondary | ICD-10-CM | POA: Diagnosis not present

## 2022-11-29 DIAGNOSIS — R2689 Other abnormalities of gait and mobility: Secondary | ICD-10-CM | POA: Diagnosis not present

## 2022-11-29 DIAGNOSIS — R1312 Dysphagia, oropharyngeal phase: Secondary | ICD-10-CM | POA: Diagnosis not present

## 2022-11-29 DIAGNOSIS — R41841 Cognitive communication deficit: Secondary | ICD-10-CM | POA: Diagnosis not present

## 2022-11-29 DIAGNOSIS — S065XAD Traumatic subdural hemorrhage with loss of consciousness status unknown, subsequent encounter: Secondary | ICD-10-CM | POA: Diagnosis not present

## 2022-11-29 DIAGNOSIS — I69828 Other speech and language deficits following other cerebrovascular disease: Secondary | ICD-10-CM | POA: Diagnosis not present

## 2022-11-29 DIAGNOSIS — I69891 Dysphagia following other cerebrovascular disease: Secondary | ICD-10-CM | POA: Diagnosis not present

## 2022-11-29 DIAGNOSIS — M6281 Muscle weakness (generalized): Secondary | ICD-10-CM | POA: Diagnosis not present

## 2022-11-29 DIAGNOSIS — R531 Weakness: Secondary | ICD-10-CM | POA: Diagnosis not present

## 2022-11-30 DIAGNOSIS — M6281 Muscle weakness (generalized): Secondary | ICD-10-CM | POA: Diagnosis not present

## 2022-11-30 DIAGNOSIS — R2689 Other abnormalities of gait and mobility: Secondary | ICD-10-CM | POA: Diagnosis not present

## 2022-11-30 DIAGNOSIS — R1312 Dysphagia, oropharyngeal phase: Secondary | ICD-10-CM | POA: Diagnosis not present

## 2022-11-30 DIAGNOSIS — S065XAD Traumatic subdural hemorrhage with loss of consciousness status unknown, subsequent encounter: Secondary | ICD-10-CM | POA: Diagnosis not present

## 2022-11-30 DIAGNOSIS — I69828 Other speech and language deficits following other cerebrovascular disease: Secondary | ICD-10-CM | POA: Diagnosis not present

## 2022-11-30 DIAGNOSIS — R41841 Cognitive communication deficit: Secondary | ICD-10-CM | POA: Diagnosis not present

## 2022-11-30 DIAGNOSIS — I69891 Dysphagia following other cerebrovascular disease: Secondary | ICD-10-CM | POA: Diagnosis not present

## 2022-11-30 DIAGNOSIS — R531 Weakness: Secondary | ICD-10-CM | POA: Diagnosis not present

## 2022-12-01 DIAGNOSIS — I69828 Other speech and language deficits following other cerebrovascular disease: Secondary | ICD-10-CM | POA: Diagnosis not present

## 2022-12-01 DIAGNOSIS — R41841 Cognitive communication deficit: Secondary | ICD-10-CM | POA: Diagnosis not present

## 2022-12-01 DIAGNOSIS — E43 Unspecified severe protein-calorie malnutrition: Secondary | ICD-10-CM | POA: Diagnosis not present

## 2022-12-01 DIAGNOSIS — R531 Weakness: Secondary | ICD-10-CM | POA: Diagnosis not present

## 2022-12-01 DIAGNOSIS — R1310 Dysphagia, unspecified: Secondary | ICD-10-CM | POA: Diagnosis not present

## 2022-12-01 DIAGNOSIS — M6281 Muscle weakness (generalized): Secondary | ICD-10-CM | POA: Diagnosis not present

## 2022-12-01 DIAGNOSIS — R2689 Other abnormalities of gait and mobility: Secondary | ICD-10-CM | POA: Diagnosis not present

## 2022-12-01 DIAGNOSIS — S065XAD Traumatic subdural hemorrhage with loss of consciousness status unknown, subsequent encounter: Secondary | ICD-10-CM | POA: Diagnosis not present

## 2022-12-01 DIAGNOSIS — I69891 Dysphagia following other cerebrovascular disease: Secondary | ICD-10-CM | POA: Diagnosis not present

## 2022-12-01 DIAGNOSIS — R1312 Dysphagia, oropharyngeal phase: Secondary | ICD-10-CM | POA: Diagnosis not present

## 2022-12-02 DIAGNOSIS — I69828 Other speech and language deficits following other cerebrovascular disease: Secondary | ICD-10-CM | POA: Diagnosis not present

## 2022-12-02 DIAGNOSIS — R1312 Dysphagia, oropharyngeal phase: Secondary | ICD-10-CM | POA: Diagnosis not present

## 2022-12-02 DIAGNOSIS — I69891 Dysphagia following other cerebrovascular disease: Secondary | ICD-10-CM | POA: Diagnosis not present

## 2022-12-02 DIAGNOSIS — R531 Weakness: Secondary | ICD-10-CM | POA: Diagnosis not present

## 2022-12-02 DIAGNOSIS — R2689 Other abnormalities of gait and mobility: Secondary | ICD-10-CM | POA: Diagnosis not present

## 2022-12-02 DIAGNOSIS — M6281 Muscle weakness (generalized): Secondary | ICD-10-CM | POA: Diagnosis not present

## 2022-12-02 DIAGNOSIS — S065XAD Traumatic subdural hemorrhage with loss of consciousness status unknown, subsequent encounter: Secondary | ICD-10-CM | POA: Diagnosis not present

## 2022-12-02 DIAGNOSIS — R41841 Cognitive communication deficit: Secondary | ICD-10-CM | POA: Diagnosis not present

## 2022-12-03 DIAGNOSIS — R2689 Other abnormalities of gait and mobility: Secondary | ICD-10-CM | POA: Diagnosis not present

## 2022-12-03 DIAGNOSIS — S065XAD Traumatic subdural hemorrhage with loss of consciousness status unknown, subsequent encounter: Secondary | ICD-10-CM | POA: Diagnosis not present

## 2022-12-03 DIAGNOSIS — R1312 Dysphagia, oropharyngeal phase: Secondary | ICD-10-CM | POA: Diagnosis not present

## 2022-12-03 DIAGNOSIS — I69891 Dysphagia following other cerebrovascular disease: Secondary | ICD-10-CM | POA: Diagnosis not present

## 2022-12-03 DIAGNOSIS — I69828 Other speech and language deficits following other cerebrovascular disease: Secondary | ICD-10-CM | POA: Diagnosis not present

## 2022-12-03 DIAGNOSIS — M6281 Muscle weakness (generalized): Secondary | ICD-10-CM | POA: Diagnosis not present

## 2022-12-03 DIAGNOSIS — R531 Weakness: Secondary | ICD-10-CM | POA: Diagnosis not present

## 2022-12-03 DIAGNOSIS — R41841 Cognitive communication deficit: Secondary | ICD-10-CM | POA: Diagnosis not present

## 2022-12-04 DIAGNOSIS — I69891 Dysphagia following other cerebrovascular disease: Secondary | ICD-10-CM | POA: Diagnosis not present

## 2022-12-04 DIAGNOSIS — M6281 Muscle weakness (generalized): Secondary | ICD-10-CM | POA: Diagnosis not present

## 2022-12-04 DIAGNOSIS — R531 Weakness: Secondary | ICD-10-CM | POA: Diagnosis not present

## 2022-12-04 DIAGNOSIS — I69828 Other speech and language deficits following other cerebrovascular disease: Secondary | ICD-10-CM | POA: Diagnosis not present

## 2022-12-04 DIAGNOSIS — R41841 Cognitive communication deficit: Secondary | ICD-10-CM | POA: Diagnosis not present

## 2022-12-04 DIAGNOSIS — S065XAD Traumatic subdural hemorrhage with loss of consciousness status unknown, subsequent encounter: Secondary | ICD-10-CM | POA: Diagnosis not present

## 2022-12-04 DIAGNOSIS — R2689 Other abnormalities of gait and mobility: Secondary | ICD-10-CM | POA: Diagnosis not present

## 2022-12-04 DIAGNOSIS — R1312 Dysphagia, oropharyngeal phase: Secondary | ICD-10-CM | POA: Diagnosis not present

## 2022-12-05 DIAGNOSIS — R1312 Dysphagia, oropharyngeal phase: Secondary | ICD-10-CM | POA: Diagnosis not present

## 2022-12-05 DIAGNOSIS — R41841 Cognitive communication deficit: Secondary | ICD-10-CM | POA: Diagnosis not present

## 2022-12-05 DIAGNOSIS — I69828 Other speech and language deficits following other cerebrovascular disease: Secondary | ICD-10-CM | POA: Diagnosis not present

## 2022-12-05 DIAGNOSIS — I69891 Dysphagia following other cerebrovascular disease: Secondary | ICD-10-CM | POA: Diagnosis not present

## 2022-12-05 DIAGNOSIS — R2689 Other abnormalities of gait and mobility: Secondary | ICD-10-CM | POA: Diagnosis not present

## 2022-12-05 DIAGNOSIS — M6281 Muscle weakness (generalized): Secondary | ICD-10-CM | POA: Diagnosis not present

## 2022-12-05 DIAGNOSIS — S065XAD Traumatic subdural hemorrhage with loss of consciousness status unknown, subsequent encounter: Secondary | ICD-10-CM | POA: Diagnosis not present

## 2022-12-05 DIAGNOSIS — R531 Weakness: Secondary | ICD-10-CM | POA: Diagnosis not present

## 2022-12-06 DIAGNOSIS — R531 Weakness: Secondary | ICD-10-CM | POA: Diagnosis not present

## 2022-12-06 DIAGNOSIS — R41841 Cognitive communication deficit: Secondary | ICD-10-CM | POA: Diagnosis not present

## 2022-12-06 DIAGNOSIS — I69891 Dysphagia following other cerebrovascular disease: Secondary | ICD-10-CM | POA: Diagnosis not present

## 2022-12-06 DIAGNOSIS — M6281 Muscle weakness (generalized): Secondary | ICD-10-CM | POA: Diagnosis not present

## 2022-12-06 DIAGNOSIS — R1312 Dysphagia, oropharyngeal phase: Secondary | ICD-10-CM | POA: Diagnosis not present

## 2022-12-06 DIAGNOSIS — S065XAD Traumatic subdural hemorrhage with loss of consciousness status unknown, subsequent encounter: Secondary | ICD-10-CM | POA: Diagnosis not present

## 2022-12-06 DIAGNOSIS — I69828 Other speech and language deficits following other cerebrovascular disease: Secondary | ICD-10-CM | POA: Diagnosis not present

## 2022-12-06 DIAGNOSIS — R2689 Other abnormalities of gait and mobility: Secondary | ICD-10-CM | POA: Diagnosis not present

## 2022-12-07 DIAGNOSIS — I69828 Other speech and language deficits following other cerebrovascular disease: Secondary | ICD-10-CM | POA: Diagnosis not present

## 2022-12-07 DIAGNOSIS — R41841 Cognitive communication deficit: Secondary | ICD-10-CM | POA: Diagnosis not present

## 2022-12-07 DIAGNOSIS — M6281 Muscle weakness (generalized): Secondary | ICD-10-CM | POA: Diagnosis not present

## 2022-12-07 DIAGNOSIS — I69891 Dysphagia following other cerebrovascular disease: Secondary | ICD-10-CM | POA: Diagnosis not present

## 2022-12-07 DIAGNOSIS — R1312 Dysphagia, oropharyngeal phase: Secondary | ICD-10-CM | POA: Diagnosis not present

## 2022-12-07 DIAGNOSIS — S065XAD Traumatic subdural hemorrhage with loss of consciousness status unknown, subsequent encounter: Secondary | ICD-10-CM | POA: Diagnosis not present

## 2022-12-07 DIAGNOSIS — R2689 Other abnormalities of gait and mobility: Secondary | ICD-10-CM | POA: Diagnosis not present

## 2022-12-07 DIAGNOSIS — R531 Weakness: Secondary | ICD-10-CM | POA: Diagnosis not present

## 2022-12-08 DIAGNOSIS — S065XAD Traumatic subdural hemorrhage with loss of consciousness status unknown, subsequent encounter: Secondary | ICD-10-CM | POA: Diagnosis not present

## 2022-12-08 DIAGNOSIS — M6281 Muscle weakness (generalized): Secondary | ICD-10-CM | POA: Diagnosis not present

## 2022-12-08 DIAGNOSIS — R2689 Other abnormalities of gait and mobility: Secondary | ICD-10-CM | POA: Diagnosis not present

## 2022-12-08 DIAGNOSIS — R1312 Dysphagia, oropharyngeal phase: Secondary | ICD-10-CM | POA: Diagnosis not present

## 2022-12-08 DIAGNOSIS — R41841 Cognitive communication deficit: Secondary | ICD-10-CM | POA: Diagnosis not present

## 2022-12-08 DIAGNOSIS — R531 Weakness: Secondary | ICD-10-CM | POA: Diagnosis not present

## 2022-12-08 DIAGNOSIS — I69828 Other speech and language deficits following other cerebrovascular disease: Secondary | ICD-10-CM | POA: Diagnosis not present

## 2022-12-08 DIAGNOSIS — I69891 Dysphagia following other cerebrovascular disease: Secondary | ICD-10-CM | POA: Diagnosis not present

## 2022-12-09 DIAGNOSIS — S065XAD Traumatic subdural hemorrhage with loss of consciousness status unknown, subsequent encounter: Secondary | ICD-10-CM | POA: Diagnosis not present

## 2022-12-09 DIAGNOSIS — I69891 Dysphagia following other cerebrovascular disease: Secondary | ICD-10-CM | POA: Diagnosis not present

## 2022-12-09 DIAGNOSIS — M6281 Muscle weakness (generalized): Secondary | ICD-10-CM | POA: Diagnosis not present

## 2022-12-09 DIAGNOSIS — R1312 Dysphagia, oropharyngeal phase: Secondary | ICD-10-CM | POA: Diagnosis not present

## 2022-12-09 DIAGNOSIS — I69828 Other speech and language deficits following other cerebrovascular disease: Secondary | ICD-10-CM | POA: Diagnosis not present

## 2022-12-09 DIAGNOSIS — R2689 Other abnormalities of gait and mobility: Secondary | ICD-10-CM | POA: Diagnosis not present

## 2022-12-09 DIAGNOSIS — R531 Weakness: Secondary | ICD-10-CM | POA: Diagnosis not present

## 2022-12-09 DIAGNOSIS — R41841 Cognitive communication deficit: Secondary | ICD-10-CM | POA: Diagnosis not present

## 2022-12-10 DIAGNOSIS — M6281 Muscle weakness (generalized): Secondary | ICD-10-CM | POA: Diagnosis not present

## 2022-12-10 DIAGNOSIS — I69828 Other speech and language deficits following other cerebrovascular disease: Secondary | ICD-10-CM | POA: Diagnosis not present

## 2022-12-10 DIAGNOSIS — R2689 Other abnormalities of gait and mobility: Secondary | ICD-10-CM | POA: Diagnosis not present

## 2022-12-10 DIAGNOSIS — R41841 Cognitive communication deficit: Secondary | ICD-10-CM | POA: Diagnosis not present

## 2022-12-10 DIAGNOSIS — R531 Weakness: Secondary | ICD-10-CM | POA: Diagnosis not present

## 2022-12-10 DIAGNOSIS — I69891 Dysphagia following other cerebrovascular disease: Secondary | ICD-10-CM | POA: Diagnosis not present

## 2022-12-10 DIAGNOSIS — S065XAD Traumatic subdural hemorrhage with loss of consciousness status unknown, subsequent encounter: Secondary | ICD-10-CM | POA: Diagnosis not present

## 2022-12-10 DIAGNOSIS — R1312 Dysphagia, oropharyngeal phase: Secondary | ICD-10-CM | POA: Diagnosis not present

## 2022-12-11 DIAGNOSIS — R2689 Other abnormalities of gait and mobility: Secondary | ICD-10-CM | POA: Diagnosis not present

## 2022-12-11 DIAGNOSIS — I69828 Other speech and language deficits following other cerebrovascular disease: Secondary | ICD-10-CM | POA: Diagnosis not present

## 2022-12-11 DIAGNOSIS — M6281 Muscle weakness (generalized): Secondary | ICD-10-CM | POA: Diagnosis not present

## 2022-12-11 DIAGNOSIS — R1312 Dysphagia, oropharyngeal phase: Secondary | ICD-10-CM | POA: Diagnosis not present

## 2022-12-11 DIAGNOSIS — I69891 Dysphagia following other cerebrovascular disease: Secondary | ICD-10-CM | POA: Diagnosis not present

## 2022-12-11 DIAGNOSIS — R531 Weakness: Secondary | ICD-10-CM | POA: Diagnosis not present

## 2022-12-11 DIAGNOSIS — R41841 Cognitive communication deficit: Secondary | ICD-10-CM | POA: Diagnosis not present

## 2022-12-11 DIAGNOSIS — S065XAD Traumatic subdural hemorrhage with loss of consciousness status unknown, subsequent encounter: Secondary | ICD-10-CM | POA: Diagnosis not present

## 2022-12-12 DIAGNOSIS — R1312 Dysphagia, oropharyngeal phase: Secondary | ICD-10-CM | POA: Diagnosis not present

## 2022-12-12 DIAGNOSIS — R531 Weakness: Secondary | ICD-10-CM | POA: Diagnosis not present

## 2022-12-12 DIAGNOSIS — R1310 Dysphagia, unspecified: Secondary | ICD-10-CM | POA: Diagnosis not present

## 2022-12-12 DIAGNOSIS — S065XAD Traumatic subdural hemorrhage with loss of consciousness status unknown, subsequent encounter: Secondary | ICD-10-CM | POA: Diagnosis not present

## 2022-12-12 DIAGNOSIS — M6281 Muscle weakness (generalized): Secondary | ICD-10-CM | POA: Diagnosis not present

## 2022-12-12 DIAGNOSIS — E1165 Type 2 diabetes mellitus with hyperglycemia: Secondary | ICD-10-CM | POA: Diagnosis not present

## 2022-12-12 DIAGNOSIS — I69828 Other speech and language deficits following other cerebrovascular disease: Secondary | ICD-10-CM | POA: Diagnosis not present

## 2022-12-12 DIAGNOSIS — E785 Hyperlipidemia, unspecified: Secondary | ICD-10-CM | POA: Diagnosis not present

## 2022-12-12 DIAGNOSIS — R2689 Other abnormalities of gait and mobility: Secondary | ICD-10-CM | POA: Diagnosis not present

## 2022-12-12 DIAGNOSIS — R41841 Cognitive communication deficit: Secondary | ICD-10-CM | POA: Diagnosis not present

## 2022-12-12 DIAGNOSIS — I69891 Dysphagia following other cerebrovascular disease: Secondary | ICD-10-CM | POA: Diagnosis not present

## 2022-12-12 DIAGNOSIS — I10 Essential (primary) hypertension: Secondary | ICD-10-CM | POA: Diagnosis not present

## 2022-12-13 DIAGNOSIS — I69828 Other speech and language deficits following other cerebrovascular disease: Secondary | ICD-10-CM | POA: Diagnosis not present

## 2022-12-13 DIAGNOSIS — R531 Weakness: Secondary | ICD-10-CM | POA: Diagnosis not present

## 2022-12-13 DIAGNOSIS — R41841 Cognitive communication deficit: Secondary | ICD-10-CM | POA: Diagnosis not present

## 2022-12-13 DIAGNOSIS — I69891 Dysphagia following other cerebrovascular disease: Secondary | ICD-10-CM | POA: Diagnosis not present

## 2022-12-13 DIAGNOSIS — R1312 Dysphagia, oropharyngeal phase: Secondary | ICD-10-CM | POA: Diagnosis not present

## 2022-12-13 DIAGNOSIS — M6281 Muscle weakness (generalized): Secondary | ICD-10-CM | POA: Diagnosis not present

## 2022-12-13 DIAGNOSIS — R2689 Other abnormalities of gait and mobility: Secondary | ICD-10-CM | POA: Diagnosis not present

## 2022-12-13 DIAGNOSIS — S065XAD Traumatic subdural hemorrhage with loss of consciousness status unknown, subsequent encounter: Secondary | ICD-10-CM | POA: Diagnosis not present

## 2022-12-14 DIAGNOSIS — M6281 Muscle weakness (generalized): Secondary | ICD-10-CM | POA: Diagnosis not present

## 2022-12-14 DIAGNOSIS — R531 Weakness: Secondary | ICD-10-CM | POA: Diagnosis not present

## 2022-12-14 DIAGNOSIS — I69891 Dysphagia following other cerebrovascular disease: Secondary | ICD-10-CM | POA: Diagnosis not present

## 2022-12-14 DIAGNOSIS — R41841 Cognitive communication deficit: Secondary | ICD-10-CM | POA: Diagnosis not present

## 2022-12-14 DIAGNOSIS — I69828 Other speech and language deficits following other cerebrovascular disease: Secondary | ICD-10-CM | POA: Diagnosis not present

## 2022-12-14 DIAGNOSIS — S065XAD Traumatic subdural hemorrhage with loss of consciousness status unknown, subsequent encounter: Secondary | ICD-10-CM | POA: Diagnosis not present

## 2022-12-14 DIAGNOSIS — R2689 Other abnormalities of gait and mobility: Secondary | ICD-10-CM | POA: Diagnosis not present

## 2022-12-14 DIAGNOSIS — R1312 Dysphagia, oropharyngeal phase: Secondary | ICD-10-CM | POA: Diagnosis not present

## 2022-12-15 DIAGNOSIS — R5081 Fever presenting with conditions classified elsewhere: Secondary | ICD-10-CM | POA: Diagnosis not present

## 2022-12-15 DIAGNOSIS — M6281 Muscle weakness (generalized): Secondary | ICD-10-CM | POA: Diagnosis not present

## 2022-12-15 DIAGNOSIS — I69891 Dysphagia following other cerebrovascular disease: Secondary | ICD-10-CM | POA: Diagnosis not present

## 2022-12-15 DIAGNOSIS — R531 Weakness: Secondary | ICD-10-CM | POA: Diagnosis not present

## 2022-12-15 DIAGNOSIS — I69828 Other speech and language deficits following other cerebrovascular disease: Secondary | ICD-10-CM | POA: Diagnosis not present

## 2022-12-15 DIAGNOSIS — R2689 Other abnormalities of gait and mobility: Secondary | ICD-10-CM | POA: Diagnosis not present

## 2022-12-15 DIAGNOSIS — R112 Nausea with vomiting, unspecified: Secondary | ICD-10-CM | POA: Diagnosis not present

## 2022-12-15 DIAGNOSIS — S065XAD Traumatic subdural hemorrhage with loss of consciousness status unknown, subsequent encounter: Secondary | ICD-10-CM | POA: Diagnosis not present

## 2022-12-15 DIAGNOSIS — R109 Unspecified abdominal pain: Secondary | ICD-10-CM | POA: Diagnosis not present

## 2022-12-15 DIAGNOSIS — R1312 Dysphagia, oropharyngeal phase: Secondary | ICD-10-CM | POA: Diagnosis not present

## 2022-12-15 DIAGNOSIS — R41841 Cognitive communication deficit: Secondary | ICD-10-CM | POA: Diagnosis not present

## 2022-12-15 DIAGNOSIS — R0989 Other specified symptoms and signs involving the circulatory and respiratory systems: Secondary | ICD-10-CM | POA: Diagnosis not present

## 2022-12-16 DIAGNOSIS — R2689 Other abnormalities of gait and mobility: Secondary | ICD-10-CM | POA: Diagnosis not present

## 2022-12-16 DIAGNOSIS — R531 Weakness: Secondary | ICD-10-CM | POA: Diagnosis not present

## 2022-12-16 DIAGNOSIS — M6281 Muscle weakness (generalized): Secondary | ICD-10-CM | POA: Diagnosis not present

## 2022-12-16 DIAGNOSIS — I10 Essential (primary) hypertension: Secondary | ICD-10-CM | POA: Diagnosis not present

## 2022-12-16 DIAGNOSIS — R41841 Cognitive communication deficit: Secondary | ICD-10-CM | POA: Diagnosis not present

## 2022-12-16 DIAGNOSIS — S065XAD Traumatic subdural hemorrhage with loss of consciousness status unknown, subsequent encounter: Secondary | ICD-10-CM | POA: Diagnosis not present

## 2022-12-16 DIAGNOSIS — I69828 Other speech and language deficits following other cerebrovascular disease: Secondary | ICD-10-CM | POA: Diagnosis not present

## 2022-12-16 DIAGNOSIS — R1312 Dysphagia, oropharyngeal phase: Secondary | ICD-10-CM | POA: Diagnosis not present

## 2022-12-16 DIAGNOSIS — I69891 Dysphagia following other cerebrovascular disease: Secondary | ICD-10-CM | POA: Diagnosis not present

## 2022-12-17 DIAGNOSIS — I69891 Dysphagia following other cerebrovascular disease: Secondary | ICD-10-CM | POA: Diagnosis not present

## 2022-12-17 DIAGNOSIS — I69828 Other speech and language deficits following other cerebrovascular disease: Secondary | ICD-10-CM | POA: Diagnosis not present

## 2022-12-17 DIAGNOSIS — M6281 Muscle weakness (generalized): Secondary | ICD-10-CM | POA: Diagnosis not present

## 2022-12-17 DIAGNOSIS — R531 Weakness: Secondary | ICD-10-CM | POA: Diagnosis not present

## 2022-12-17 DIAGNOSIS — R41841 Cognitive communication deficit: Secondary | ICD-10-CM | POA: Diagnosis not present

## 2022-12-17 DIAGNOSIS — S065XAD Traumatic subdural hemorrhage with loss of consciousness status unknown, subsequent encounter: Secondary | ICD-10-CM | POA: Diagnosis not present

## 2022-12-17 DIAGNOSIS — R1312 Dysphagia, oropharyngeal phase: Secondary | ICD-10-CM | POA: Diagnosis not present

## 2022-12-17 DIAGNOSIS — R2689 Other abnormalities of gait and mobility: Secondary | ICD-10-CM | POA: Diagnosis not present

## 2022-12-18 DIAGNOSIS — R1312 Dysphagia, oropharyngeal phase: Secondary | ICD-10-CM | POA: Diagnosis not present

## 2022-12-18 DIAGNOSIS — R2689 Other abnormalities of gait and mobility: Secondary | ICD-10-CM | POA: Diagnosis not present

## 2022-12-18 DIAGNOSIS — R531 Weakness: Secondary | ICD-10-CM | POA: Diagnosis not present

## 2022-12-18 DIAGNOSIS — S065XAD Traumatic subdural hemorrhage with loss of consciousness status unknown, subsequent encounter: Secondary | ICD-10-CM | POA: Diagnosis not present

## 2022-12-18 DIAGNOSIS — I69828 Other speech and language deficits following other cerebrovascular disease: Secondary | ICD-10-CM | POA: Diagnosis not present

## 2022-12-18 DIAGNOSIS — R41841 Cognitive communication deficit: Secondary | ICD-10-CM | POA: Diagnosis not present

## 2022-12-18 DIAGNOSIS — M6281 Muscle weakness (generalized): Secondary | ICD-10-CM | POA: Diagnosis not present

## 2022-12-18 DIAGNOSIS — I69891 Dysphagia following other cerebrovascular disease: Secondary | ICD-10-CM | POA: Diagnosis not present

## 2022-12-19 DIAGNOSIS — I501 Left ventricular failure: Secondary | ICD-10-CM | POA: Diagnosis not present

## 2022-12-19 DIAGNOSIS — M6281 Muscle weakness (generalized): Secondary | ICD-10-CM | POA: Diagnosis not present

## 2022-12-19 DIAGNOSIS — R2689 Other abnormalities of gait and mobility: Secondary | ICD-10-CM | POA: Diagnosis not present

## 2022-12-19 DIAGNOSIS — R531 Weakness: Secondary | ICD-10-CM | POA: Diagnosis not present

## 2022-12-19 DIAGNOSIS — S065XAD Traumatic subdural hemorrhage with loss of consciousness status unknown, subsequent encounter: Secondary | ICD-10-CM | POA: Diagnosis not present

## 2022-12-19 DIAGNOSIS — R1312 Dysphagia, oropharyngeal phase: Secondary | ICD-10-CM | POA: Diagnosis not present

## 2022-12-19 DIAGNOSIS — I10 Essential (primary) hypertension: Secondary | ICD-10-CM | POA: Diagnosis not present

## 2022-12-19 DIAGNOSIS — R41841 Cognitive communication deficit: Secondary | ICD-10-CM | POA: Diagnosis not present

## 2022-12-19 DIAGNOSIS — I69891 Dysphagia following other cerebrovascular disease: Secondary | ICD-10-CM | POA: Diagnosis not present

## 2022-12-19 DIAGNOSIS — I69828 Other speech and language deficits following other cerebrovascular disease: Secondary | ICD-10-CM | POA: Diagnosis not present

## 2022-12-19 DIAGNOSIS — E559 Vitamin D deficiency, unspecified: Secondary | ICD-10-CM | POA: Diagnosis not present

## 2022-12-19 DIAGNOSIS — E119 Type 2 diabetes mellitus without complications: Secondary | ICD-10-CM | POA: Diagnosis not present

## 2022-12-21 DIAGNOSIS — R2689 Other abnormalities of gait and mobility: Secondary | ICD-10-CM | POA: Diagnosis not present

## 2022-12-21 DIAGNOSIS — M6281 Muscle weakness (generalized): Secondary | ICD-10-CM | POA: Diagnosis not present

## 2022-12-21 DIAGNOSIS — R41841 Cognitive communication deficit: Secondary | ICD-10-CM | POA: Diagnosis not present

## 2022-12-21 DIAGNOSIS — I69828 Other speech and language deficits following other cerebrovascular disease: Secondary | ICD-10-CM | POA: Diagnosis not present

## 2022-12-21 DIAGNOSIS — S065XAD Traumatic subdural hemorrhage with loss of consciousness status unknown, subsequent encounter: Secondary | ICD-10-CM | POA: Diagnosis not present

## 2022-12-21 DIAGNOSIS — R531 Weakness: Secondary | ICD-10-CM | POA: Diagnosis not present

## 2022-12-21 DIAGNOSIS — I69891 Dysphagia following other cerebrovascular disease: Secondary | ICD-10-CM | POA: Diagnosis not present

## 2022-12-21 DIAGNOSIS — R1312 Dysphagia, oropharyngeal phase: Secondary | ICD-10-CM | POA: Diagnosis not present

## 2022-12-22 DIAGNOSIS — R531 Weakness: Secondary | ICD-10-CM | POA: Diagnosis not present

## 2022-12-22 DIAGNOSIS — E559 Vitamin D deficiency, unspecified: Secondary | ICD-10-CM | POA: Diagnosis not present

## 2022-12-22 DIAGNOSIS — I69891 Dysphagia following other cerebrovascular disease: Secondary | ICD-10-CM | POA: Diagnosis not present

## 2022-12-22 DIAGNOSIS — R2689 Other abnormalities of gait and mobility: Secondary | ICD-10-CM | POA: Diagnosis not present

## 2022-12-22 DIAGNOSIS — M6281 Muscle weakness (generalized): Secondary | ICD-10-CM | POA: Diagnosis not present

## 2022-12-22 DIAGNOSIS — R1312 Dysphagia, oropharyngeal phase: Secondary | ICD-10-CM | POA: Diagnosis not present

## 2022-12-22 DIAGNOSIS — R41841 Cognitive communication deficit: Secondary | ICD-10-CM | POA: Diagnosis not present

## 2022-12-22 DIAGNOSIS — S065XAD Traumatic subdural hemorrhage with loss of consciousness status unknown, subsequent encounter: Secondary | ICD-10-CM | POA: Diagnosis not present

## 2022-12-22 DIAGNOSIS — I69828 Other speech and language deficits following other cerebrovascular disease: Secondary | ICD-10-CM | POA: Diagnosis not present

## 2022-12-22 DIAGNOSIS — E1165 Type 2 diabetes mellitus with hyperglycemia: Secondary | ICD-10-CM | POA: Diagnosis not present

## 2022-12-23 DIAGNOSIS — R531 Weakness: Secondary | ICD-10-CM | POA: Diagnosis not present

## 2022-12-23 DIAGNOSIS — M6281 Muscle weakness (generalized): Secondary | ICD-10-CM | POA: Diagnosis not present

## 2022-12-23 DIAGNOSIS — R1312 Dysphagia, oropharyngeal phase: Secondary | ICD-10-CM | POA: Diagnosis not present

## 2022-12-23 DIAGNOSIS — I69891 Dysphagia following other cerebrovascular disease: Secondary | ICD-10-CM | POA: Diagnosis not present

## 2022-12-23 DIAGNOSIS — R2689 Other abnormalities of gait and mobility: Secondary | ICD-10-CM | POA: Diagnosis not present

## 2022-12-23 DIAGNOSIS — S065XAD Traumatic subdural hemorrhage with loss of consciousness status unknown, subsequent encounter: Secondary | ICD-10-CM | POA: Diagnosis not present

## 2022-12-23 DIAGNOSIS — R41841 Cognitive communication deficit: Secondary | ICD-10-CM | POA: Diagnosis not present

## 2022-12-23 DIAGNOSIS — E119 Type 2 diabetes mellitus without complications: Secondary | ICD-10-CM | POA: Diagnosis not present

## 2022-12-23 DIAGNOSIS — I69828 Other speech and language deficits following other cerebrovascular disease: Secondary | ICD-10-CM | POA: Diagnosis not present

## 2022-12-24 DIAGNOSIS — M6281 Muscle weakness (generalized): Secondary | ICD-10-CM | POA: Diagnosis not present

## 2022-12-24 DIAGNOSIS — R1312 Dysphagia, oropharyngeal phase: Secondary | ICD-10-CM | POA: Diagnosis not present

## 2022-12-24 DIAGNOSIS — R531 Weakness: Secondary | ICD-10-CM | POA: Diagnosis not present

## 2022-12-24 DIAGNOSIS — R2689 Other abnormalities of gait and mobility: Secondary | ICD-10-CM | POA: Diagnosis not present

## 2022-12-24 DIAGNOSIS — I69828 Other speech and language deficits following other cerebrovascular disease: Secondary | ICD-10-CM | POA: Diagnosis not present

## 2022-12-24 DIAGNOSIS — R41841 Cognitive communication deficit: Secondary | ICD-10-CM | POA: Diagnosis not present

## 2022-12-24 DIAGNOSIS — I69891 Dysphagia following other cerebrovascular disease: Secondary | ICD-10-CM | POA: Diagnosis not present

## 2022-12-24 DIAGNOSIS — S065XAD Traumatic subdural hemorrhage with loss of consciousness status unknown, subsequent encounter: Secondary | ICD-10-CM | POA: Diagnosis not present

## 2022-12-25 DIAGNOSIS — R531 Weakness: Secondary | ICD-10-CM | POA: Diagnosis not present

## 2022-12-25 DIAGNOSIS — I69891 Dysphagia following other cerebrovascular disease: Secondary | ICD-10-CM | POA: Diagnosis not present

## 2022-12-25 DIAGNOSIS — R41841 Cognitive communication deficit: Secondary | ICD-10-CM | POA: Diagnosis not present

## 2022-12-25 DIAGNOSIS — S065XAD Traumatic subdural hemorrhage with loss of consciousness status unknown, subsequent encounter: Secondary | ICD-10-CM | POA: Diagnosis not present

## 2022-12-25 DIAGNOSIS — I69828 Other speech and language deficits following other cerebrovascular disease: Secondary | ICD-10-CM | POA: Diagnosis not present

## 2022-12-25 DIAGNOSIS — R1312 Dysphagia, oropharyngeal phase: Secondary | ICD-10-CM | POA: Diagnosis not present

## 2022-12-25 DIAGNOSIS — M6281 Muscle weakness (generalized): Secondary | ICD-10-CM | POA: Diagnosis not present

## 2022-12-25 DIAGNOSIS — R2689 Other abnormalities of gait and mobility: Secondary | ICD-10-CM | POA: Diagnosis not present

## 2022-12-26 DIAGNOSIS — R2689 Other abnormalities of gait and mobility: Secondary | ICD-10-CM | POA: Diagnosis not present

## 2022-12-26 DIAGNOSIS — S065XAD Traumatic subdural hemorrhage with loss of consciousness status unknown, subsequent encounter: Secondary | ICD-10-CM | POA: Diagnosis not present

## 2022-12-26 DIAGNOSIS — I69828 Other speech and language deficits following other cerebrovascular disease: Secondary | ICD-10-CM | POA: Diagnosis not present

## 2022-12-26 DIAGNOSIS — I69891 Dysphagia following other cerebrovascular disease: Secondary | ICD-10-CM | POA: Diagnosis not present

## 2022-12-26 DIAGNOSIS — R41841 Cognitive communication deficit: Secondary | ICD-10-CM | POA: Diagnosis not present

## 2022-12-26 DIAGNOSIS — M6281 Muscle weakness (generalized): Secondary | ICD-10-CM | POA: Diagnosis not present

## 2022-12-26 DIAGNOSIS — R531 Weakness: Secondary | ICD-10-CM | POA: Diagnosis not present

## 2022-12-26 DIAGNOSIS — R1312 Dysphagia, oropharyngeal phase: Secondary | ICD-10-CM | POA: Diagnosis not present

## 2022-12-28 DIAGNOSIS — R2689 Other abnormalities of gait and mobility: Secondary | ICD-10-CM | POA: Diagnosis not present

## 2022-12-28 DIAGNOSIS — S065XAD Traumatic subdural hemorrhage with loss of consciousness status unknown, subsequent encounter: Secondary | ICD-10-CM | POA: Diagnosis not present

## 2022-12-28 DIAGNOSIS — R1312 Dysphagia, oropharyngeal phase: Secondary | ICD-10-CM | POA: Diagnosis not present

## 2022-12-28 DIAGNOSIS — R41841 Cognitive communication deficit: Secondary | ICD-10-CM | POA: Diagnosis not present

## 2022-12-28 DIAGNOSIS — R531 Weakness: Secondary | ICD-10-CM | POA: Diagnosis not present

## 2022-12-28 DIAGNOSIS — I69891 Dysphagia following other cerebrovascular disease: Secondary | ICD-10-CM | POA: Diagnosis not present

## 2022-12-28 DIAGNOSIS — I69828 Other speech and language deficits following other cerebrovascular disease: Secondary | ICD-10-CM | POA: Diagnosis not present

## 2022-12-28 DIAGNOSIS — M6281 Muscle weakness (generalized): Secondary | ICD-10-CM | POA: Diagnosis not present

## 2022-12-29 DIAGNOSIS — S065XAD Traumatic subdural hemorrhage with loss of consciousness status unknown, subsequent encounter: Secondary | ICD-10-CM | POA: Diagnosis not present

## 2022-12-29 DIAGNOSIS — R531 Weakness: Secondary | ICD-10-CM | POA: Diagnosis not present

## 2022-12-29 DIAGNOSIS — R2689 Other abnormalities of gait and mobility: Secondary | ICD-10-CM | POA: Diagnosis not present

## 2022-12-31 DIAGNOSIS — R2689 Other abnormalities of gait and mobility: Secondary | ICD-10-CM | POA: Diagnosis not present

## 2022-12-31 DIAGNOSIS — R531 Weakness: Secondary | ICD-10-CM | POA: Diagnosis not present

## 2022-12-31 DIAGNOSIS — S065XAD Traumatic subdural hemorrhage with loss of consciousness status unknown, subsequent encounter: Secondary | ICD-10-CM | POA: Diagnosis not present

## 2023-01-06 ENCOUNTER — Emergency Department (HOSPITAL_COMMUNITY)
Admission: EM | Admit: 2023-01-06 | Discharge: 2023-01-06 | Disposition: A | Discharge status: Home / Self Care | Payer: Medicare HMO | Attending: Emergency Medicine | Admitting: Emergency Medicine

## 2023-01-06 ENCOUNTER — Encounter (HOSPITAL_COMMUNITY): Payer: Self-pay

## 2023-01-06 ENCOUNTER — Emergency Department (HOSPITAL_COMMUNITY): Payer: Medicare HMO

## 2023-01-06 ENCOUNTER — Other Ambulatory Visit: Payer: Self-pay

## 2023-01-06 DIAGNOSIS — R0902 Hypoxemia: Secondary | ICD-10-CM | POA: Diagnosis not present

## 2023-01-06 DIAGNOSIS — E559 Vitamin D deficiency, unspecified: Secondary | ICD-10-CM | POA: Diagnosis not present

## 2023-01-06 DIAGNOSIS — N390 Urinary tract infection, site not specified: Secondary | ICD-10-CM | POA: Insufficient documentation

## 2023-01-06 DIAGNOSIS — Z79899 Other long term (current) drug therapy: Secondary | ICD-10-CM | POA: Diagnosis not present

## 2023-01-06 DIAGNOSIS — Z794 Long term (current) use of insulin: Secondary | ICD-10-CM | POA: Insufficient documentation

## 2023-01-06 DIAGNOSIS — I10 Essential (primary) hypertension: Secondary | ICD-10-CM | POA: Diagnosis not present

## 2023-01-06 DIAGNOSIS — I700 Atherosclerosis of aorta: Secondary | ICD-10-CM | POA: Diagnosis not present

## 2023-01-06 DIAGNOSIS — G9389 Other specified disorders of brain: Secondary | ICD-10-CM | POA: Diagnosis not present

## 2023-01-06 DIAGNOSIS — R569 Unspecified convulsions: Secondary | ICD-10-CM | POA: Diagnosis not present

## 2023-01-06 DIAGNOSIS — E1165 Type 2 diabetes mellitus with hyperglycemia: Secondary | ICD-10-CM | POA: Diagnosis not present

## 2023-01-06 DIAGNOSIS — R112 Nausea with vomiting, unspecified: Secondary | ICD-10-CM | POA: Diagnosis not present

## 2023-01-06 DIAGNOSIS — E119 Type 2 diabetes mellitus without complications: Secondary | ICD-10-CM | POA: Insufficient documentation

## 2023-01-06 DIAGNOSIS — Z7984 Long term (current) use of oral hypoglycemic drugs: Secondary | ICD-10-CM | POA: Insufficient documentation

## 2023-01-06 DIAGNOSIS — Z7401 Bed confinement status: Secondary | ICD-10-CM | POA: Diagnosis not present

## 2023-01-06 DIAGNOSIS — R110 Nausea: Secondary | ICD-10-CM | POA: Diagnosis not present

## 2023-01-06 DIAGNOSIS — R9082 White matter disease, unspecified: Secondary | ICD-10-CM | POA: Diagnosis not present

## 2023-01-06 DIAGNOSIS — R1111 Vomiting without nausea: Secondary | ICD-10-CM | POA: Diagnosis not present

## 2023-01-06 DIAGNOSIS — R059 Cough, unspecified: Secondary | ICD-10-CM | POA: Diagnosis not present

## 2023-01-06 LAB — URINALYSIS, W/ REFLEX TO CULTURE (INFECTION SUSPECTED)
Bilirubin Urine: NEGATIVE
Glucose, UA: NEGATIVE mg/dL
Ketones, ur: NEGATIVE mg/dL
Nitrite: NEGATIVE
Protein, ur: >=300 mg/dL — AB
RBC / HPF: >50 RBC/hpf (ref 0–5)
Specific Gravity, Urine: 1.018 (ref 1.005–1.030)
WBC, UA: >50 WBC/hpf (ref 0–5)
pH: 5 (ref 5.0–8.0)

## 2023-01-06 LAB — COMPREHENSIVE METABOLIC PANEL
ALT: 29 U/L (ref 0–44)
AST: 39 U/L (ref 15–41)
Albumin: 2.6 g/dL — ABNORMAL LOW (ref 3.5–5.0)
Alkaline Phosphatase: 58 U/L (ref 38–126)
Anion gap: 11 (ref 5–15)
BUN: 19 mg/dL (ref 8–23)
CO2: 23 mmol/L (ref 22–32)
Calcium: 8.6 mg/dL — ABNORMAL LOW (ref 8.9–10.3)
Chloride: 100 mmol/L (ref 98–111)
Creatinine, Ser: 0.77 mg/dL (ref 0.44–1.00)
GFR, Estimated: >60 mL/min (ref >60)
Glucose, Bld: 137 mg/dL — ABNORMAL HIGH (ref 70–99)
Potassium: 3.7 mmol/L (ref 3.5–5.1)
Sodium: 134 mmol/L — ABNORMAL LOW (ref 135–145)
Total Bilirubin: 0.4 mg/dL (ref 0.3–1.2)
Total Protein: 6.7 g/dL (ref 6.5–8.1)

## 2023-01-06 LAB — CBC WITH DIFFERENTIAL/PLATELET
Abs Immature Granulocytes: 0.03 10*3/uL (ref 0.00–0.07)
Basophils Absolute: 0 10*3/uL (ref 0.0–0.1)
Basophils Relative: 0 %
Eosinophils Absolute: 0.1 10*3/uL (ref 0.0–0.5)
Eosinophils Relative: 1 %
HCT: 31.7 % — ABNORMAL LOW (ref 36.0–46.0)
Hemoglobin: 10.3 g/dL — ABNORMAL LOW (ref 12.0–15.0)
Immature Granulocytes: 0 %
Lymphocytes Relative: 12 %
Lymphs Abs: 0.9 10*3/uL (ref 0.7–4.0)
MCH: 30.2 pg (ref 26.0–34.0)
MCHC: 32.5 g/dL (ref 30.0–36.0)
MCV: 93 fL (ref 80.0–100.0)
Monocytes Absolute: 0.7 10*3/uL (ref 0.1–1.0)
Monocytes Relative: 9 %
Neutro Abs: 5.8 10*3/uL (ref 1.7–7.7)
Neutrophils Relative %: 78 %
Platelets: 265 10*3/uL (ref 150–400)
RBC: 3.41 MIL/uL — ABNORMAL LOW (ref 3.87–5.11)
RDW: 20.1 % — ABNORMAL HIGH (ref 11.5–15.5)
WBC: 7.5 10*3/uL (ref 4.0–10.5)
nRBC: 0 % (ref 0.0–0.2)

## 2023-01-06 LAB — MAGNESIUM: Magnesium: 1.7 mg/dL (ref 1.7–2.4)

## 2023-01-06 MED ORDER — CEFDINIR 250 MG/5ML PO SUSR: 300.0000 mg | Freq: Two times a day (BID) | ORAL | 0 refills | Status: AC

## 2023-01-06 MED ORDER — SODIUM CHLORIDE 0.9 % IV SOLN
2000.0000 mg | Freq: Once | INTRAVENOUS | Status: AC
  Administered 2023-01-06: 2000 mg via INTRAVENOUS
  Filled 2023-01-06: qty 20

## 2023-01-06 MED ORDER — SODIUM CHLORIDE 0.9 % IV SOLN
1.0000 g | Freq: Once | INTRAVENOUS | Status: AC
  Administered 2023-01-06: 1 g via INTRAVENOUS
  Filled 2023-01-06: qty 10

## 2023-01-06 MED ORDER — LEVETIRACETAM 100 MG/ML PO SOLN: 500.0000 mg | Freq: Two times a day (BID) | ORAL | 12 refills | Status: DC

## 2023-01-06 NOTE — ED Provider Notes (Signed)
[place] EMERGENCY DEPARTMENT AT [facility] Provider Note  CSN: [id number] Arrival date & time: 01/06/23 1906  Chief Complaint(s) Seizures  HPI ZI SEK is a 87 y.o. female history of hypertension, hyperlipidemia, diabetes, large intracranial bleed January of this year with residual left-sided hemiparesis presenting to the emergency department with possible seizure.  Patient was with her daughter at nursing facility when she seemed to have shaking activity of the right arm, that her eyes rolled back loss of consciousness.  When paramedics arrived the patient seemed postictal but on en route has been more awake, answering questions and following commands.  No history of seizure.  History otherwise limited given speech deficit but patient shakes head no about any focal symptoms.  EMS denied any recent abnormalities such as fevers or chills.  Will discuss with patient's daughter as well.   Past Medical History Past Medical History:  Diagnosis Date   Anemia of chronic disease 07/08/2006   Antral ulcer 02/23/2007   Seen on EGD in 2008, small ulcer with erosion    Cerebral vascular accident (HCC) 04/14/2006   1998, left lower extremity numbness, no residual deficits    Essential hypertension 04/14/2006   Gastroesophageal reflux disease 02/18/2013   Occasional, symptomatically relieved with peptobismol    Healthcare maintenance 12/04/2011   Hyperlipidemia LDL goal < 100 04/14/2006   Hypertensive retinopathy of both eyes, grade 1 05/15/2016   Osteopenia of right femoral neck 10/20/2016   DEXA (10/16/2016): R femur T -2.5 (FRAX tool calculates at -2.4), L1-L4 spine T -0.9, 10 year risk for: Major osteoporotic fracture 8.3%, Hip fracture 2.7%   Type 2 diabetes mellitus with stage 1 chronic kidney disease, without long-term current use of insulin (HCC)    Type II diabetes mellitus (HCC) 04/14/2006   Patient Active Problem List   Diagnosis Date Noted   Mild malnutrition (HCC)  08/07/2022   Subdural hematoma (HCC) 08/02/2022   Acute respiratory failure with hypoxia (HCC) 07/22/2022   DNR (do not resuscitate) 07/22/2022   Pressure injury of skin 07/22/2022   Ventilator dependence (HCC) 07/11/2022   Malnutrition of moderate degree 07/10/2022   S/P craniotomy 07/08/2022   ICH (intracerebral hemorrhage) (HCC) 07/08/2022   Need for prophylactic vaccination against Streptococcus pneumoniae (pneumococcus) 11/15/2021   Prolapse of female pelvic organs 05/15/2020   Osteopenia of right femoral neck 10/20/2016   Hypertensive retinopathy of both eyes, grade 1 05/15/2016   Gastroesophageal reflux disease without esophagitis 02/18/2013   Healthcare maintenance 12/04/2011   Anemia in chronic renal disease 07/08/2006   Type 2 diabetes mellitus with stage 1 chronic kidney disease, without long-term current use of insulin (HCC) 04/14/2006   Hyperlipidemia 04/14/2006   Essential hypertension 04/14/2006   Home Medication(s) Prior to Admission medications   Medication Sig Start Date End Date Taking? Authorizing Provider  cefdinir (OMNICEF) 250 MG/5ML suspension Take 6 mLs (300 mg total) by mouth 2 (two) times daily for 7 days. 01/06/23 01/13/23 Yes Lonell Grandchild, MD  levETIRAcetam (KEPPRA) 100 MG/ML solution Place 5 mLs (500 mg total) into feeding tube 2 (two) times daily. 01/06/23  Yes Lonell Grandchild, MD  acetaminophen (TYLENOL) 160 MG/5ML solution Place 20.3 mLs (650 mg total) into feeding tube every 8 (eight) hours. 08/22/22   Setzer, Lynnell Jude, PA-C  amLODipine (NORVASC) 10 MG tablet Place 1 tablet (10 mg total) into feeding tube daily. 08/23/22   Setzer, Lynnell Jude, PA-C  ascorbic acid (VITAMIN C) 500 MG tablet Place 1 tablet (500 mg total)  into feeding tube daily. 08/23/22   Setzer, Lynnell Jude, PA-C  atorvastatin (LIPITOR) 80 MG tablet Place 1 tablet (80 mg total) into feeding tube daily. 08/23/22   Setzer, Lynnell Jude, PA-C  carvedilol (COREG) 6.25 MG tablet Place 1 tablet (6.25  mg total) into feeding tube 2 (two) times daily with a meal. 08/22/22   Setzer, Lynnell Jude, PA-C  diclofenac Sodium (VOLTAREN) 1 % GEL Apply 2 g topically 4 (four) times daily. 08/22/22   Setzer, Lynnell Jude, PA-C  ezetimibe (ZETIA) 10 MG tablet Place 1 tablet (10 mg total) into feeding tube daily. 08/23/22   Setzer, Lynnell Jude, PA-C  famotidine (PEPCID) 20 MG tablet Place 1 tablet (20 mg total) into feeding tube 2 (two) times daily. 08/22/22   Setzer, Lynnell Jude, PA-C  ferrous sulfate 300 (60 Fe) MG/5ML syrup Place 5 mLs (300 mg total) into feeding tube daily with breakfast. 08/23/22   Setzer, Lynnell Jude, PA-C  FLUoxetine (PROZAC) 20 MG capsule Place 1 capsule (20 mg total) into feeding tube at bedtime. 08/22/22   Setzer, Lynnell Jude, PA-C  gabapentin (NEURONTIN) 250 MG/5ML solution Place 2 mLs (100 mg total) into feeding tube every 8 (eight) hours. 08/22/22   Setzer, Lynnell Jude, PA-C  insulin aspart (NOVOLOG) 100 UNIT/ML injection Inject 0-15 Units into the skin 3 (three) times daily with meals. 08/22/22   Setzer, Lynnell Jude, PA-C  insulin aspart (NOVOLOG) 100 UNIT/ML injection Inject 0-5 Units into the skin at bedtime. 08/22/22   Setzer, Lynnell Jude, PA-C  l-methylfolate-B6-B12 (METANX) 3-35-2 MG TABS tablet Place 1 tablet into feeding tube daily. 08/23/22   Setzer, Lynnell Jude, PA-C  lidocaine (LIDODERM) 5 % Place 1 patch onto the skin daily. Remove & Discard patch within 12 hours or as directed by MD 08/23/22   Valetta Fuller, Lynnell Jude, PA-C  metFORMIN (GLUCOPHAGE) 500 MG tablet Place 1 tablet (500 mg total) into feeding tube 2 (two) times daily with a meal. 08/22/22   Setzer, Lynnell Jude, PA-C  nutrition supplement, JUVEN, (JUVEN) PACK Place 1 packet into feeding tube 2 (two) times daily between meals. 08/22/22   Setzer, Lynnell Jude, PA-C  Nutritional Supplements (FEEDING SUPPLEMENT, GLUCERNA 1.5 CAL,) LIQD Place 237 mLs into feeding tube 5 (five) times daily. 08/22/22   Setzer, Lynnell Jude, PA-C  nystatin (MYCOSTATIN) 100000 UNIT/ML suspension Use  as directed 5 mLs (500,000 Units total) in the mouth or throat 4 (four) times daily. 08/22/22   Setzer, Lynnell Jude, PA-C  polyethylene glycol (MIRALAX / GLYCOLAX) 17 g packet Place 17 g into feeding tube daily as needed for mild constipation. 08/22/22   Setzer, Lynnell Jude, PA-C  polyvinyl alcohol (LIQUIFILM TEARS) 1.4 % ophthalmic solution Place 1 drop into both eyes as needed for dry eyes. 08/22/22   Setzer, Lynnell Jude, PA-C  Water For Irrigation, Sterile (FREE WATER) SOLN Place 120 mLs into feeding tube 5 (five) times daily. 08/22/22   Setzer, Lynnell Jude, PA-C  Past Surgical History Past Surgical History:  Procedure Laterality Date   COLONOSCOPY     CRANIOTOMY Right 07/08/2022   Procedure: RIGHT CRANIOTOMY HEMATOMA EVACUATION SUBDURAL;  Surgeon: Julio Sicks, MD;  Location: [facility] OR;  Service: Neurosurgery;  Laterality: Right;   ESOPHAGOGASTRODUODENOSCOPY     ESOPHAGOGASTRODUODENOSCOPY (EGD) WITH PROPOFOL N/A 08/01/2022   Procedure: ESOPHAGOGASTRODUODENOSCOPY (EGD) WITH PROPOFOL;  Surgeon: Diamantina Monks, MD;  Location: [facility] ENDOSCOPY;  Service: General;  Laterality: N/A;   PEG PLACEMENT N/A 08/01/2022   Procedure: PERCUTANEOUS ENDOSCOPIC GASTROSTOMY (PEG) PLACEMENT;  Surgeon: Diamantina Monks, MD;  Location: [facility] ENDOSCOPY;  Service: General;  Laterality: N/A;   Family History Family History  Problem Relation Age of Onset   Stroke Mother    Diabetes Mother    Hypertension Mother    Anuerysm Father 37       Cerebral   Asthma Sister    Breast cancer Sister    Pulmonary disease Brother        Black lung   Stroke Sister    Cirrhosis Brother    Hypertension Son    Asthma Brother     Social History Social History   Tobacco Use   Smoking status: Former    Types: Cigarettes    Quit date: 06/30/1978    Years since quitting: 44.5   Smokeless tobacco: Never  Vaping Use   Vaping  Use: Never used  Substance Use Topics   Alcohol use: No   Drug use: No   Allergies Enalapril, Losartan, and Chlorhexidine  Review of Systems Review of Systems  Unable to perform ROS: Mental status change    Physical Exam Vital Signs  I have reviewed the triage vital signs BP (!) 140/76   Pulse 81   Temp 99.2 F (37.3 C) (Oral)   Resp 20   Ht 5\' 3"  (1.6 m)   Wt 54 kg   SpO2 100%   BMI 21.08 kg/m  Physical Exam Vitals and nursing note reviewed.  Constitutional:      General: She is not in acute distress.    Appearance: She is well-developed.  HENT:     Head: Normocephalic and atraumatic.     Mouth/Throat:     Mouth: Mucous membranes are moist.  Eyes:     Pupils: Pupils are equal, round, and reactive to light.  Cardiovascular:     Rate and Rhythm: Normal rate and regular rhythm.     Heart sounds: No murmur heard. Pulmonary:     Effort: Pulmonary effort is normal. No respiratory distress.     Breath sounds: Normal breath sounds.  Abdominal:     General: Abdomen is flat.     Palpations: Abdomen is soft.     Tenderness: There is no abdominal tenderness.  Musculoskeletal:        General: No tenderness.     Right lower leg: No edema.     Left lower leg: No edema.  Skin:    General: Skin is warm and dry.  Neurological:     Mental Status: She is alert.     Comments: Chronic left hemiparesis with essentially no movement of the left upper or left lower extremity, strength 5 out of 5 in the right upper and right lower extremity.  Dense left facial droop and dysarthria, cranial nerves otherwise intact.  No seizure activity.  Patient oriented to self, city.  Following commands.  Psychiatric:        Mood and Affect: Mood normal.  Behavior: Behavior normal.     ED Results and Treatments Labs (all labs ordered are listed, but only abnormal results are displayed) Labs Reviewed  COMPREHENSIVE METABOLIC PANEL - Abnormal; Notable for the following components:       Result Value   Sodium 134 (*)    Glucose, Bld 137 (*)    Calcium 8.6 (*)    Albumin 2.6 (*)    All other components within normal limits  CBC WITH DIFFERENTIAL/PLATELET - Abnormal; Notable for the following components:   RBC 3.41 (*)    Hemoglobin 10.3 (*)    HCT 31.7 (*)    RDW 20.1 (*)    All other components within normal limits  URINALYSIS, W/ REFLEX TO CULTURE (INFECTION SUSPECTED) - Abnormal; Notable for the following components:   Color, Urine AMBER (*)    APPearance CLOUDY (*)    Hgb urine dipstick LARGE (*)    Protein, ur >=300 (*)    Leukocytes,Ua LARGE (*)    Bacteria, UA RARE (*)    All other components within normal limits  URINE CULTURE  MAGNESIUM                                                                                                                          Radiology DG Chest Port 1 View  Result Date: 01/06/2023 CLINICAL DATA:  Cough EXAM: PORTABLE CHEST 1 VIEW COMPARISON:  08/18/2022 FINDINGS: Heart and mediastinal contours are within normal limits. No focal opacities or effusions. No acute bony abnormality. Aortic atherosclerosis. IMPRESSION: No active cardiopulmonary disease. Electronically Signed   By: Charlett Nose M.D.   On: 01/06/2023 21:17   CT Head Wo Contrast  Result Date: 01/06/2023 CLINICAL DATA:  Seizures EXAM: CT HEAD WITHOUT CONTRAST TECHNIQUE: Contiguous axial images were obtained from the base of the skull through the vertex without intravenous contrast. RADIATION DOSE REDUCTION: This exam was performed according to the departmental dose-optimization program which includes automated exposure control, adjustment of the mA and/or kV according to patient size and/or use of iterative reconstruction technique. COMPARISON:  07/10/2022 FINDINGS: Brain: No acute intracranial findings are seen. There are no signs of bleeding within the cranium. There is large area of encephalomalacia in the right frontal lobe. There is right frontal craniotomy. Findings  suggest encephalomalacia from previous surgery. Small linear calcifications are noted adjacent to the inner table at the level of craniotomy. There is prominence of the third and both lateral ventricles. There is decreased density in periventricular and subcortical white matter. Small old lacunar infarct is seen in left basal ganglia. There is no shift of midline structures. Cortical sulci are prominent. Vascular: Extensive arterial calcifications are seen. Skull: There is previous right frontal craniotomy. No acute findings are seen. Sinuses/Orbits: Unremarkable. Other: None. IMPRESSION: No acute intracranial findings are seen. There is large area of encephalomalacia in the right frontal lobe. There is previous right frontal craniotomy. Old lacunar infarct is seen in the left basal ganglia. Decreased density in  periventricular white matter suggests small vessel disease. Electronically Signed   By: Ernie Avena M.D.   On: 01/06/2023 20:35    Pertinent labs & imaging results that were available during my care of the patient were reviewed by me and considered in my medical decision making (see MDM for details).  Medications Ordered in ED Medications  cefTRIAXone (ROCEPHIN) 1 g in sodium chloride 0.9 % 100 mL IVPB (0 g Intravenous Stopped 01/06/23 2135)  levETIRAcetam (KEPPRA) 2,000 mg in sodium chloride 0.9 % 250 mL IVPB (0 mg Intravenous Stopped 01/06/23 2209)                                                                                                                                     Procedures Procedures  (including critical care time)  Medical Decision Making / ED Course   MDM:  87 year old female with prior severe brain injury presenting with possible seizure.  History does seem most likely to represent seizure given generalized shaking activity followed by postictal state described by the paramedics.  Patient does have possible cause with previous brain injury.  Will obtain labs, CT  head, urinalysis to evaluate for potential triggers.  Given that patient has prior brain injury will likely discuss with neurology whether she needs to be started on antiepileptic.  Clinical Course as of 01/07/23 1605  Tue Jan 06, 2023  2230 Discussed with Dr. Wilford Corner, given prior ICH agrees with starting AED, will prescribe Keppra 500 mg BID. Likely trigger UTI, urine culture sent although unfortunately lab refused to add on sample and repeat urine collection was obtained after antibiotics. CT head otherwise without acute changes, labs without significant metabolic abnormalities to explain likely seizure. Had extensive discussion with patient's family about results, recommend outpatient follow up with her outpatient neurologist at [facility]. Will discharge patient back to facility, discussed return precautions with patient and family and listed on the discharge instructions. Patient does not drive.  [WS]    Clinical Course User Index [WS] Lonell Grandchild, MD     Additional history obtained: -Additional history obtained from ems -External records from outside source obtained and reviewed including: Chart review including previous notes, labs, imaging, consultation notes including prior hospitalization notes and prior imaging   Lab Tests: -I ordered, reviewed, and interpreted labs.   The pertinent results include:   Labs Reviewed  COMPREHENSIVE METABOLIC PANEL - Abnormal; Notable for the following components:      Result Value   Sodium 134 (*)    Glucose, Bld 137 (*)    Calcium 8.6 (*)    Albumin 2.6 (*)    All other components within normal limits  CBC WITH DIFFERENTIAL/PLATELET - Abnormal; Notable for the following components:   RBC 3.41 (*)    Hemoglobin 10.3 (*)    HCT 31.7 (*)    RDW 20.1 (*)    All other components within normal limits  URINALYSIS,  W/ REFLEX TO CULTURE (INFECTION SUSPECTED) - Abnormal; Notable for the following components:   Color, Urine AMBER (*)     APPearance CLOUDY (*)    Hgb urine dipstick LARGE (*)    Protein, ur >=300 (*)    Leukocytes,Ua LARGE (*)    Bacteria, UA RARE (*)    All other components within normal limits  URINE CULTURE  MAGNESIUM    Notable for signs of UTI  EKG   EKG Interpretation Date/Time:  Tuesday January 06 2023 19:15:32 EDT Ventricular Rate:  85 PR Interval:  207 QRS Duration:  85 QT Interval:  390 QTC Calculation: 464 R Axis:   0  Text Interpretation: Sinus rhythm Left ventricular hypertrophy Anterior Q waves, possibly due to LVH Confirmed by Alvino Blood ([id number]) on 01/06/2023 7:38:36 PM         Imaging Studies ordered: I ordered imaging studies including CT head On my interpretation imaging demonstrates no acute process I independently visualized and interpreted imaging. I agree with the radiologist interpretation   Medicines ordered and prescription drug management: Meds ordered this encounter  Medications   cefTRIAXone (ROCEPHIN) 1 g in sodium chloride 0.9 % 100 mL IVPB    Order Specific Question:   Antibiotic Indication:    Answer:   UTI   levETIRAcetam (KEPPRA) 2,000 mg in sodium chloride 0.9 % 250 mL IVPB   levETIRAcetam (KEPPRA) 100 MG/ML solution    Sig: Place 5 mLs (500 mg total) into feeding tube 2 (two) times daily.    Dispense:  473 mL    Refill:  12   cefdinir (OMNICEF) 250 MG/5ML suspension    Sig: Take 6 mLs (300 mg total) by mouth 2 (two) times daily for 7 days.    Dispense:  84 mL    Refill:  0    -I have reviewed the patients home medicines and have made adjustments as needed   Consultations Obtained: I requested consultation with the neurologist,  and discussed lab and imaging findings as well as pertinent plan - they recommend: load with keppra and start AED   Cardiac Monitoring: The patient was maintained on a cardiac monitor.  I personally viewed and interpreted the cardiac monitored which showed an underlying rhythm of: NSR  Social Determinants of  Health:  Diagnosis or treatment significantly limited by social determinants of health: former smoker   Reevaluation: After the interventions noted above, I reevaluated the patient and found that their symptoms have resolved  Co morbidities that complicate the patient evaluation  Past Medical History:  Diagnosis Date   Anemia of chronic disease 07/08/2006   Antral ulcer 02/23/2007   Seen on EGD in 2008, small ulcer with erosion    Cerebral vascular accident (HCC) 04/14/2006   1998, left lower extremity numbness, no residual deficits    Essential hypertension 04/14/2006   Gastroesophageal reflux disease 02/18/2013   Occasional, symptomatically relieved with peptobismol    Healthcare maintenance 12/04/2011   Hyperlipidemia LDL goal < 100 04/14/2006   Hypertensive retinopathy of both eyes, grade 1 05/15/2016   Osteopenia of right femoral neck 10/20/2016   DEXA (10/16/2016): R femur T -2.5 (FRAX tool calculates at -2.4), L1-L4 spine T -0.9, 10 year risk for: Major osteoporotic fracture 8.3%, Hip fracture 2.7%   Type 2 diabetes mellitus with stage 1 chronic kidney disease, without long-term current use of insulin (HCC)    Type II diabetes mellitus (HCC) 04/14/2006      Dispostion: Disposition decision including need for hospitalization was  considered, and patient discharged from emergency department.    Final Clinical Impression(s) / ED Diagnoses Final diagnoses:  Seizure (HCC)  Urinary tract infection without hematuria, site unspecified     This chart was dictated using voice recognition software.  Despite best efforts to proofread,  errors can occur which can change the documentation meaning.    Lonell Grandchild, MD 01/07/23 [phone]

## 2023-01-06 NOTE — Discharge Instructions (Addendum)
We evaluated you for your episode of shaking.  Your symptoms were concerning for a seizure.  We discussed your symptoms with the neurologist Dr. Wilford Corner who recommended starting you on a seizure medication.  Please also call Dr. Pearlean Brownie for follow-up.  We have placed a referral but please also call to help expedite your follow-up.  You are at a much higher risk of seizures because of your prior brain injury.  Seizures can be triggered by many different problems including infections or electrolyte abnormalities.  Your urinalysis did show signs of urine infection so we have treated you with antibiotics and sent the prescription for antibiotics to the pharmacy.  If you have any further seizures, loss of consciousness, high fevers, confusion, new pain or discomfort, difficulty breathing, chest pain or any other new symptoms, please return to the emergency department for reassessment.

## 2023-01-06 NOTE — ED Triage Notes (Signed)
Pt from [facility], per daughgter afterr pt arter rt arm stiffened an eyes rolled back into head. EMS reports post ictal on their arrival. No sx hx but had CVA in [place] with left side weakness

## 2023-01-06 NOTE — ED Notes (Signed)
[facility] called for transport to [facility]. 

## 2023-01-06 NOTE — ED Notes (Signed)
Report to staff at [facility].

## 2023-01-07 DIAGNOSIS — Z13220 Encounter for screening for lipoid disorders: Secondary | ICD-10-CM | POA: Diagnosis not present

## 2023-01-07 DIAGNOSIS — I10 Essential (primary) hypertension: Secondary | ICD-10-CM | POA: Diagnosis not present

## 2023-01-07 DIAGNOSIS — R748 Abnormal levels of other serum enzymes: Secondary | ICD-10-CM | POA: Diagnosis not present

## 2023-01-09 DIAGNOSIS — I69354 Hemiplegia and hemiparesis following cerebral infarction affecting left non-dominant side: Secondary | ICD-10-CM | POA: Diagnosis not present

## 2023-01-09 DIAGNOSIS — G40919 Epilepsy, unspecified, intractable, without status epilepticus: Secondary | ICD-10-CM | POA: Diagnosis not present

## 2023-01-09 DIAGNOSIS — R2681 Unsteadiness on feet: Secondary | ICD-10-CM | POA: Diagnosis not present

## 2023-01-09 DIAGNOSIS — M6281 Muscle weakness (generalized): Secondary | ICD-10-CM | POA: Diagnosis not present

## 2023-01-09 LAB — URINE CULTURE: Culture: 30000 — AB

## 2023-01-10 ENCOUNTER — Telehealth (HOSPITAL_BASED_OUTPATIENT_CLINIC_OR_DEPARTMENT_OTHER): Payer: Self-pay | Admitting: *Deleted

## 2023-01-10 NOTE — Progress Notes (Signed)
ED Antimicrobial Stewardship Positive Culture Follow Up   Gina Kelley is an 87 y.o. female who presented to [facility] on 01/06/2023 with a chief complaint of  Chief Complaint  Patient presents with   Seizures    Recent Results (from the past 720 hour(s))  Urine Culture     Status: Abnormal   Collection Time: 01/06/23  9:43 PM   Specimen: In/Out Cath Urine  Result Value Ref Range Status   Specimen Description IN/OUT CATH URINE  Final   Special Requests   Final    NONE Performed at [facility], [address] [place], [place] [address]    Culture (A)  Final    30,000 COLONIES/mL PROTEUS MIRABILIS >=100,000 COLONIES/mL ENTEROCOCCUS FAECALIS    Report Status 01/09/2023 FINAL  Final   Organism ID, Bacteria PROTEUS MIRABILIS (A)  Final   Organism ID, Bacteria ENTEROCOCCUS FAECALIS (A)  Final      Susceptibility   Enterococcus faecalis - MIC*    AMPICILLIN <=2 SENSITIVE Sensitive     NITROFURANTOIN <=16 SENSITIVE Sensitive     VANCOMYCIN 1 SENSITIVE Sensitive     * >=100,000 COLONIES/mL ENTEROCOCCUS FAECALIS   Proteus mirabilis - MIC*    AMPICILLIN <=2 SENSITIVE Sensitive     CEFAZOLIN <=4 SENSITIVE Sensitive     CEFEPIME <=0.12 SENSITIVE Sensitive     CEFTRIAXONE <=0.25 SENSITIVE Sensitive     CIPROFLOXACIN <=0.25 SENSITIVE Sensitive     GENTAMICIN <=1 SENSITIVE Sensitive     IMIPENEM 1 SENSITIVE Sensitive     NITROFURANTOIN RESISTANT Resistant     TRIMETH/SULFA <=20 SENSITIVE Sensitive     AMPICILLIN/SULBACTAM <=2 SENSITIVE Sensitive     PIP/TAZO <=4 SENSITIVE Sensitive     * 30,000 COLONIES/mL PROTEUS MIRABILIS    [x]  Treated with cefdinir, Enterococcus organism resistant to prescribed antimicrobial  New antibiotic prescription: amoxicillin 250mg /80mL oral suspension - give 500mg  (10mL) per tube every 8 hours x 5 days (total quantity: )  ED Provider: Fayrene Helper, PA-C   Doristine Counter, PharmD, BCPS 01/10/2023, 10:46 AM Clinical Pharmacist Monday -  Friday phone -  [phone] Saturday - Sunday phone - [phone]

## 2023-01-10 NOTE — Telephone Encounter (Signed)
Post ED Visit - Positive Culture Follow-up: Successful Patient Follow-Up  Culture assessed and recommendations reviewed by:  []  Enzo Bi, Pharm.D. []  Celedonio Miyamoto, Pharm.D., BCPS AQ-ID []  Garvin Fila, Pharm.D., BCPS []  Georgina Pillion, [address].D., BCPS []  [place], [address].D., BCPS, AAHIVP []  Estella Husk, Pharm.D., BCPS, AAHIVP []  Lysle Pearl, PharmD, BCPS []  Phillips Climes, PharmD, BCPS []  Agapito Games, PharmD, BCPS [x]  Wilburn Cornelia, PharmD  Positive urine culture  []  Patient discharged without antimicrobial prescription and treatment is now indicated [x]  Organism is resistant to prescribed ED discharge antimicrobial []  Patient with positive blood cultures  Changes discussed with ED provider: Fayrene Helper, PA-C New antibiotic prescription Amoxicillin 250mg /80ml oral suspension- give 500mg  (10ml) per tube every 8 hrs x 5 days (total )  Spoke to nurse at [facility]. Faxed culture report to [phone]  Contacted patient, date 01/10/23, time 1340   Gina Kelley 01/10/2023, 1:42 PM

## 2023-01-12 ENCOUNTER — Telehealth: Payer: Self-pay

## 2023-01-12 NOTE — Telephone Encounter (Signed)
Transition Care Management Follow-up Telephone Call Date of discharge and from where: [facility]9 How have you been since you were released from the hospital? Doing better  in a [name] Any questions or concerns? No  Items Reviewed: Did the pt receive and understand the discharge instructions provided? Yes  Medications obtained and verified? Yes  Other? No  Any new allergies since your discharge? No  Dietary orders reviewed? No Do you have support at home? Yes  [name]    Follow up appointments reviewed:  PCP Hospital f/u appt confirmed? Yes  Scheduled to see  on  @ . Specialist Hospital f/u appt confirmed? Yes  Scheduled to see  on  @ . Are transportation arrangements needed? No  If their condition worsens, is the pt aware to call PCP or go to the Emergency Dept.? Yes Was the patient provided with contact information for the PCP's office or ED? Yes Was to pt encouraged to call back with questions or concerns? Yes

## 2023-01-20 ENCOUNTER — Telehealth: Payer: Self-pay | Admitting: Internal Medicine

## 2023-01-20 DIAGNOSIS — M6281 Muscle weakness (generalized): Secondary | ICD-10-CM | POA: Diagnosis not present

## 2023-01-20 DIAGNOSIS — E1165 Type 2 diabetes mellitus with hyperglycemia: Secondary | ICD-10-CM | POA: Diagnosis not present

## 2023-01-20 DIAGNOSIS — E559 Vitamin D deficiency, unspecified: Secondary | ICD-10-CM | POA: Diagnosis not present

## 2023-01-20 DIAGNOSIS — I6921 Attention and concentration deficit following other nontraumatic intracranial hemorrhage: Secondary | ICD-10-CM | POA: Diagnosis not present

## 2023-01-20 NOTE — Telephone Encounter (Signed)
Contacted [facility] to schedule their annual wellness visit. Patient declined to schedule AWV at this time. Patient is in a nursing home. Seeing pcp there.  [facility] Care Guide [facility] AWV TEAM Direct Dial: [phone]

## 2023-01-22 ENCOUNTER — Encounter: Payer: Self-pay | Admitting: Neurology

## 2023-01-22 ENCOUNTER — Ambulatory Visit: Payer: Medicare HMO | Admitting: Neurology

## 2023-01-22 VITALS — BP 111/63 | HR 70

## 2023-01-22 DIAGNOSIS — R569 Unspecified convulsions: Secondary | ICD-10-CM | POA: Diagnosis not present

## 2023-01-22 DIAGNOSIS — R471 Dysarthria and anarthria: Secondary | ICD-10-CM

## 2023-01-22 DIAGNOSIS — I611 Nontraumatic intracerebral hemorrhage in hemisphere, cortical: Secondary | ICD-10-CM

## 2023-01-22 DIAGNOSIS — G8114 Spastic hemiplegia affecting left nondominant side: Secondary | ICD-10-CM

## 2023-01-22 MED ORDER — LEVETIRACETAM 100 MG/ML PO SOLN: 500.0000 mg | Freq: Two times a day (BID) | ORAL | 12 refills | Status: AC

## 2023-01-22 NOTE — Patient Instructions (Signed)
I had a long discussion with the patient and her daughter regarding her hypertensive intracerebral hemorrhage, craniotomy and residual spastic dysarthria and spastic hemiplegia and answered questions.  Recommend continue strict control of hypertension with blood pressure goal below 140/90.  Recommend continue physical and Occupational Therapy.  Refer to Dr. Terrace Arabia for Botox injection for hand spasticity.  Continue Keppra for seizure prophylaxis in the current dose of 500 mg twice daily.  Check EEG.  Return for follow-up in the future only as needed.

## 2023-01-22 NOTE — Progress Notes (Signed)
[facility] [address] [place]. [place] [address] [phone]       OFFICE FOLLOW-UP VISIT NOTE  [name] Date of Birth:  30-Jun-1936 Medical Record Number:  [id number]   Referring MD:    Reason for Referral: Intracerebral hemorrhage  HPI: Initial visit 10/16/2022 G[facility] is a 87 year old pleasant African-American lady seen today for initial office consultation visit for intracerebral hemorrhage.  She is accompanied by by her daughter.  History is obtained from them and review of electronic medical records.  I personally reviewed pertinent available imaging films in PACS.  He has past medical history of hypertension, hyperlipidemia, hypertensive retinopathy, diabetes anemia of chronic disease.  She presented on 07/08/2022 to [facility] as a code stroke for sudden onset of aphasia, left hemiplegia, right gaze deviation, left facial droop.  On arrival her systolic blood pressure was in the 270s range.  NIH stroke scale was 42.  ICH score was 2.  Ct Scan of the head showed a large right frontal parenchymal hemorrhage Centered around the operculum 4 x 3.9 x 4.1 with an associated overlying subdural hemorrhage measuring up to 10 mm in thickness in the right frontal convexity and 12 mm right to left midline shift.  Blood pressure was controlled with Cleviprex drip and she was intubated.  Surgery was consulted patient for emergent craniotomy for clot evacuation and subdural evacuation.  EEG showed continuous slow generalized slowing in the right frontotemporal region but no definite seizure activity.  Patient was in the neurological ICU and gradually did better and was extubated.  He hemiplegia and dysphagia.  He is transferred to inpatient rehab and stayed there from 08/02/22 to 08/22/2022.  She required PEG feeding tube.  She was transferred to skilled nursing facility due to lack of significant improvement.  He is currently at [facility].  She is  getting physical occupational and speech therapy every day.  Her speech has improved and be understood with some difficulty though she still has some dysarthria.  Still using tobacco the last week.  She is still using PEG tube though last week she started using some pure diet.  She is tolerating well so far.  She still has significant left hemiplegia with practically no movement in the left arm.  He is able to move the left leg but it is still weak and she is unable to bear weight or even stand or walk even with the help of the therapist.  Family is now thinking more about the long-term nursing home placement.  Blood pressure seems better controlled today it is 145/63. Update 01/22/2023 : Patient is referred back to me for follow-up for episode of possible seizure.  She was seen in the ER on 01/06/2023 with sudden onset of shaking of the right arm with eyes rolling back with brief loss of consciousness.  When paramedics arrived the patient seemed postictal and en route became more awake and was able to answer questions and follow commands.  No further seizure activity was noted.  Basic chemistries were unremarkable but UA suggested UTI.  CT head showed no acute abnormality.  Patient was felt to have a seizure triggered by the UTI and was started on Keppra 5 mg twice daily.  Patient has been tolerating Keppra well without side effects.  She has had no further breakthrough seizures.  EEG was not done.  Patient continues to have spastic dysarthria as well as significant hemiplegia.  She is and wheelchair-bound at baseline.  She has stopped therapies.  She is not able to even stand or walk.  She needs to be fed.  She still has a PEG tube.  Family is feels she has not been eating enough for the PEG tube to come out at this time. ROS:   14 system review of systems is positive for dysarthria, seizure, UTI, dysphagia, weakness, difficulty walking, wheelchair-bound all other systems negative  PMH:  Past Medical History:   Diagnosis Date   Anemia of chronic disease 07/08/2006   Antral ulcer 02/23/2007   Seen on EGD in 2008, small ulcer with erosion    Cerebral vascular accident (HCC) 04/14/2006   1998, left lower extremity numbness, no residual deficits    Essential hypertension 04/14/2006   Gastroesophageal reflux disease 02/18/2013   Occasional, symptomatically relieved with peptobismol    Healthcare maintenance 12/04/2011   Hyperlipidemia LDL goal < 100 04/14/2006   Hypertensive retinopathy of both eyes, grade 1 05/15/2016   Osteopenia of right femoral neck 10/20/2016   DEXA (10/16/2016): R femur T -2.5 (FRAX tool calculates at -2.4), L1-L4 spine T -0.9, 10 year risk for: Major osteoporotic fracture 8.3%, Hip fracture 2.7%   Type 2 diabetes mellitus with stage 1 chronic kidney disease, without long-term current use of insulin (HCC)    Type II diabetes mellitus (HCC) 04/14/2006    Social History:  Social History   Socioeconomic History   Marital status: Married    Spouse name: Not on file   Number of children: Not on file   Years of education: Not on file   Highest education level: Not on file  Occupational History   Occupation: Retired     Comment: [employer]  Tobacco Use   Smoking status: Former    Current packs/day: 0.00    Types: Cigarettes    Quit date: 06/30/1978    Years since quitting: 44.5   Smokeless tobacco: Never  Vaping Use   Vaping status: Never Used  Substance and Sexual Activity   Alcohol use: No   Drug use: No   Sexual activity: Not Currently  Other Topics Concern   Not on file  Social History Narrative   Current Social History 09/07/2020        Patient lives with husband in one level home with 5 outside steps with handrails on both sides        Patient's method of transportation is personal car (drives herself)      The highest level of education was 9 th grade      The patient currently retired from [employer] (due to [employer]).      Identified important  Relationships are, "My family"       Pets : None       Interests / Fun: "Go to [employer], sing, read. Went to gym before [employer]."       Current Stressors: "I don't have any. 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Ducatte, BSN, RN-BC       Social Determinants of Health   Financial Resource Strain: Not on file  Food Insecurity: Not on file  Transportation Needs: Not on file  Physical Activity: Not on file  Stress: Not on file  Social Connections: Not on file  Intimate Partner Violence: Not on file    Medications:   Current Outpatient Medications on File Prior to Visit  Medication Sig Dispense Refill   acetaminophen (TYLENOL) 160 MG/5ML solution Place 20.3 mLs (650 mg total) into feeding tube every 8 (eight) hours. 120 mL 0   amLODipine (NORVASC) 10 MG tablet Place 1 tablet (10 mg total) into feeding tube daily.     ascorbic acid (VITAMIN C) 500 MG tablet Place 1 tablet (500 mg total) into feeding tube daily.     atorvastatin (LIPITOR) 80 MG tablet Place 1 tablet (80 mg total) into feeding tube daily.     carvedilol (COREG) 6.25 MG tablet Place 1 tablet (6.25 mg total) into feeding tube 2 (two) times daily with a meal.     diclofenac Sodium (VOLTAREN) 1 % GEL Apply 2 g topically 4 (four) times daily.     ezetimibe (ZETIA) 10 MG tablet Place 1 tablet (10 mg total) into feeding tube daily.     famotidine (PEPCID) 20 MG tablet Place 1 tablet (20 mg total) into feeding tube 2 (two) times daily.     ferrous sulfate 300 (60 Fe) MG/5ML syrup Place 5 mLs (300 mg total) into feeding tube daily with breakfast. 150 mL 3   FLUoxetine (PROZAC) 20 MG capsule Place 1 capsule (20 mg total) into feeding tube at bedtime.  3   gabapentin (NEURONTIN) 250 MG/5ML solution Place 2 mLs (100 mg total) into  feeding tube every 8 (eight) hours.  12   insulin aspart (NOVOLOG) 100 UNIT/ML injection Inject 0-15 Units into the skin 3 (three) times daily with meals. 10 mL 11   insulin aspart (NOVOLOG) 100 UNIT/ML injection Inject 0-5 Units into the skin at bedtime. 10 mL 11   l-methylfolate-B6-B12 (METANX) 3-35-2 MG TABS tablet Place 1 tablet into feeding tube daily. 60 tablet    lidocaine (LIDODERM) 5 % Place 1 patch onto the skin daily. Remove & Discard patch within 12 hours or as directed by MD 30 patch 0   metFORMIN (GLUCOPHAGE) 500 MG tablet Place 1 tablet (500 mg total) into feeding tube 2 (two) times daily with a meal.     nutrition supplement, JUVEN, (JUVEN) PACK Place 1 packet into feeding tube 2 (two) times daily between meals.  0   Nutritional Supplements (FEEDING SUPPLEMENT, GLUCERNA 1.5 CAL,) LIQD Place 237 mLs into feeding tube 5 (five) times daily.     nystatin (MYCOSTATIN) 100000 UNIT/ML suspension Use as directed 5 mLs (500,000 Units total) in the mouth or throat 4 (four) times daily. 60 mL 0   polyethylene glycol (MIRALAX / GLYCOLAX) 17 g packet Place 17 g into feeding tube daily as needed for mild constipation. 14 each 0   polyvinyl alcohol (LIQUIFILM TEARS) 1.4 % ophthalmic solution Place 1 drop into both eyes as needed for dry eyes. 15 mL 0   Water For Irrigation, Sterile (FREE WATER) SOLN Place 120 mLs into feeding tube 5 (five) times daily.     No current facility-administered medications on file prior to visit.    Allergies:   Allergies  Allergen Reactions   Enalapril Cough   Losartan Other (See Comments)    angioedema   Chlorhexidine Swelling    Possible irritation from CHG, eye tongue and lip swelling    Physical Exam General: Frail elderly African-American lady, seated, in no evident distress Head: head normocephalic and atraumatic.   Neck: supple with no carotid or supraclavicular bruits Cardiovascular: regular  rate and rhythm, no murmurs Musculoskeletal: no  deformity Skin:  no rash/petichiae Vascular:  Normal pulses all extremities  Neurologic Exam Mental Status: Awake and fully alert. Oriented to place but not to time.  Severe dysarthria but can be understood with some difficulty.  Follows simple midline and 1 and two-step commands.   Cranial Nerves: Fundoscopic exam reveals sharp disc margins. Pupils equal, briskly reactive to light. Extraocular movements full without nystagmus. Visual fields full to confrontation. Hearing intact. Facial sensation intact. Face, tongue, palate moves normally and symmetrically.  Motor: Spastic left hemiplegia with left upper extremity 1/5 strength with only movement on the shoulder.  Left lower extremity 3/5 strength with grip.  Tone is increased on the left.  Normal strength on the right side. Sensory.: intact to touch , pinprick , position and vibratory sensation.  Coordination: Normal on the right and untestable on the left.   Gait and Station: Deferred as patient is unable to walk even with the therapist Reflexes: 2+ and asymmetric and brisker on the left. Toes downgoing.   NIHSS  13 Modified Rankin  4   ASSESSMENT: 87 year old African-American lady with right frontal parenchymal and subdural hemorrhage in January 2024 status postcraniotomy but with significant residual spastic left hemiplegia dysarthria and dysphagia.  Etiology likely uncontrolled HT.  New onset of episode of transient body shaking with unresponsiveness likely symptomatic seizure in the setting of UTI.     PLAN:II had a long discussion with the patient and her daughter regarding her hypertensive intracerebral hemorrhage, craniotomy and residual spastic dysarthria and spastic hemiplegia and answered questions.  Recommend continue strict control of hypertension with blood pressure goal below 140/90.  Recommend continue physical and Occupational Therapy.  Refer to Dr. Terrace Arabia for Botox injection for hand spasticity.  Continue Keppra for seizure  prophylaxis in the current dose of 500 mg twice daily.  Check EEG.  Return for follow-up in the future only as needed. Greater than 50% time during this 40-minute  visit was spent on counseling and coordination of care about her intracerebral hemorrhage and residual spastic hemiplegia Delia Heady, MD Note: This document was prepared with digital dictation and possible smart phrase technology. Any transcriptional errors that result from this process are unintentional.

## 2023-01-29 DIAGNOSIS — E559 Vitamin D deficiency, unspecified: Secondary | ICD-10-CM | POA: Diagnosis not present

## 2023-01-29 DIAGNOSIS — I10 Essential (primary) hypertension: Secondary | ICD-10-CM | POA: Diagnosis not present

## 2023-02-04 ENCOUNTER — Ambulatory Visit (INDEPENDENT_AMBULATORY_CARE_PROVIDER_SITE_OTHER): Payer: Medicare HMO | Admitting: Neurology

## 2023-02-04 DIAGNOSIS — R569 Unspecified convulsions: Secondary | ICD-10-CM

## 2023-02-04 DIAGNOSIS — I611 Nontraumatic intracerebral hemorrhage in hemisphere, cortical: Secondary | ICD-10-CM

## 2023-02-08 NOTE — Progress Notes (Signed)
Kindly inform the patient that EEG of brainwave study did not show any seizure activity.  He did show mild slowing of brainwave activity which can happen with old-age and a variety of other conditions.

## 2023-02-09 ENCOUNTER — Telehealth: Payer: Self-pay

## 2023-02-09 NOTE — Telephone Encounter (Signed)
-----   Message from Delia Heady sent at 02/08/2023  3:56 PM EDT ----- [facility] inform the patient that EEG of brainwave study did not show any seizure activity.  He did show mild slowing of brainwave activity which can happen with old-age and a variety of other conditions.

## 2023-02-09 NOTE — Telephone Encounter (Signed)
I spoke with the patient's daughter, [name]. I informed her of the results of the EEG. She would like to know if the patient should continue on anti seizure medication. I did advise that a normal EEG does not completely rule out the possibility of seizure activity and to continue her medication until she hears otherwise. She is requesting a call back.

## 2023-02-11 DIAGNOSIS — K219 Gastro-esophageal reflux disease without esophagitis: Secondary | ICD-10-CM | POA: Diagnosis not present

## 2023-02-11 DIAGNOSIS — E1165 Type 2 diabetes mellitus with hyperglycemia: Secondary | ICD-10-CM | POA: Diagnosis not present

## 2023-02-11 DIAGNOSIS — R051 Acute cough: Secondary | ICD-10-CM | POA: Diagnosis not present

## 2023-02-11 DIAGNOSIS — R062 Wheezing: Secondary | ICD-10-CM | POA: Diagnosis not present

## 2023-02-11 DIAGNOSIS — I129 Hypertensive chronic kidney disease with stage 1 through stage 4 chronic kidney disease, or unspecified chronic kidney disease: Secondary | ICD-10-CM | POA: Diagnosis not present

## 2023-02-11 DIAGNOSIS — R0989 Other specified symptoms and signs involving the circulatory and respiratory systems: Secondary | ICD-10-CM | POA: Diagnosis not present

## 2023-02-13 ENCOUNTER — Emergency Department (HOSPITAL_COMMUNITY): Payer: Medicare HMO

## 2023-02-13 ENCOUNTER — Encounter (HOSPITAL_COMMUNITY): Payer: Self-pay

## 2023-02-13 ENCOUNTER — Other Ambulatory Visit: Payer: Self-pay

## 2023-02-13 ENCOUNTER — Inpatient Hospital Stay (HOSPITAL_COMMUNITY)
Admission: EM | Admit: 2023-02-13 | Discharge: 2023-02-18 | DRG: 871 | Disposition: A | Discharge status: Skilled Nursing Facility | Payer: Medicare HMO | Source: Skilled Nursing Facility | Attending: Internal Medicine | Admitting: Internal Medicine

## 2023-02-13 DIAGNOSIS — J690 Pneumonitis due to inhalation of food and vomit: Secondary | ICD-10-CM | POA: Diagnosis not present

## 2023-02-13 DIAGNOSIS — R339 Retention of urine, unspecified: Secondary | ICD-10-CM | POA: Diagnosis present

## 2023-02-13 DIAGNOSIS — I959 Hypotension, unspecified: Secondary | ICD-10-CM | POA: Diagnosis not present

## 2023-02-13 DIAGNOSIS — D631 Anemia in chronic kidney disease: Secondary | ICD-10-CM | POA: Diagnosis present

## 2023-02-13 DIAGNOSIS — J189 Pneumonia, unspecified organism: Secondary | ICD-10-CM | POA: Diagnosis not present

## 2023-02-13 DIAGNOSIS — E785 Hyperlipidemia, unspecified: Secondary | ICD-10-CM | POA: Diagnosis present

## 2023-02-13 DIAGNOSIS — Z1152 Encounter for screening for COVID-19: Secondary | ICD-10-CM | POA: Diagnosis not present

## 2023-02-13 DIAGNOSIS — E876 Hypokalemia: Secondary | ICD-10-CM | POA: Diagnosis not present

## 2023-02-13 DIAGNOSIS — N189 Chronic kidney disease, unspecified: Secondary | ICD-10-CM | POA: Diagnosis present

## 2023-02-13 DIAGNOSIS — Z8379 Family history of other diseases of the digestive system: Secondary | ICD-10-CM

## 2023-02-13 DIAGNOSIS — G9341 Metabolic encephalopathy: Secondary | ICD-10-CM | POA: Diagnosis not present

## 2023-02-13 DIAGNOSIS — I129 Hypertensive chronic kidney disease with stage 1 through stage 4 chronic kidney disease, or unspecified chronic kidney disease: Secondary | ICD-10-CM | POA: Diagnosis present

## 2023-02-13 DIAGNOSIS — R1310 Dysphagia, unspecified: Secondary | ICD-10-CM | POA: Diagnosis present

## 2023-02-13 DIAGNOSIS — Z888 Allergy status to other drugs, medicaments and biological substances status: Secondary | ICD-10-CM

## 2023-02-13 DIAGNOSIS — I69320 Aphasia following cerebral infarction: Secondary | ICD-10-CM

## 2023-02-13 DIAGNOSIS — Z79899 Other long term (current) drug therapy: Secondary | ICD-10-CM

## 2023-02-13 DIAGNOSIS — R6520 Severe sepsis without septic shock: Secondary | ICD-10-CM | POA: Diagnosis not present

## 2023-02-13 DIAGNOSIS — G40909 Epilepsy, unspecified, not intractable, without status epilepticus: Secondary | ICD-10-CM | POA: Diagnosis present

## 2023-02-13 DIAGNOSIS — I69322 Dysarthria following cerebral infarction: Secondary | ICD-10-CM

## 2023-02-13 DIAGNOSIS — Z789 Other specified health status: Secondary | ICD-10-CM | POA: Diagnosis not present

## 2023-02-13 DIAGNOSIS — Z833 Family history of diabetes mellitus: Secondary | ICD-10-CM

## 2023-02-13 DIAGNOSIS — G709 Myoneural disorder, unspecified: Secondary | ICD-10-CM | POA: Diagnosis present

## 2023-02-13 DIAGNOSIS — S065XAA Traumatic subdural hemorrhage with loss of consciousness status unknown, initial encounter: Secondary | ICD-10-CM | POA: Diagnosis not present

## 2023-02-13 DIAGNOSIS — Z6822 Body mass index (BMI) 22.0-22.9, adult: Secondary | ICD-10-CM

## 2023-02-13 DIAGNOSIS — Z91014 Allergy to mammalian meats: Secondary | ICD-10-CM

## 2023-02-13 DIAGNOSIS — N181 Chronic kidney disease, stage 1: Secondary | ICD-10-CM | POA: Diagnosis not present

## 2023-02-13 DIAGNOSIS — F39 Unspecified mood [affective] disorder: Secondary | ICD-10-CM | POA: Diagnosis present

## 2023-02-13 DIAGNOSIS — Z931 Gastrostomy status: Secondary | ICD-10-CM | POA: Diagnosis not present

## 2023-02-13 DIAGNOSIS — Z515 Encounter for palliative care: Secondary | ICD-10-CM | POA: Diagnosis not present

## 2023-02-13 DIAGNOSIS — Z836 Family history of other diseases of the respiratory system: Secondary | ICD-10-CM

## 2023-02-13 DIAGNOSIS — E861 Hypovolemia: Secondary | ICD-10-CM | POA: Diagnosis not present

## 2023-02-13 DIAGNOSIS — A419 Sepsis, unspecified organism: Principal | ICD-10-CM | POA: Diagnosis present

## 2023-02-13 DIAGNOSIS — N390 Urinary tract infection, site not specified: Secondary | ICD-10-CM | POA: Diagnosis not present

## 2023-02-13 DIAGNOSIS — E440 Moderate protein-calorie malnutrition: Secondary | ICD-10-CM | POA: Diagnosis not present

## 2023-02-13 DIAGNOSIS — R918 Other nonspecific abnormal finding of lung field: Secondary | ICD-10-CM | POA: Diagnosis not present

## 2023-02-13 DIAGNOSIS — M85851 Other specified disorders of bone density and structure, right thigh: Secondary | ICD-10-CM | POA: Diagnosis present

## 2023-02-13 DIAGNOSIS — I69391 Dysphagia following cerebral infarction: Secondary | ICD-10-CM

## 2023-02-13 DIAGNOSIS — N179 Acute kidney failure, unspecified: Secondary | ICD-10-CM

## 2023-02-13 DIAGNOSIS — N320 Bladder-neck obstruction: Secondary | ICD-10-CM | POA: Diagnosis present

## 2023-02-13 DIAGNOSIS — D649 Anemia, unspecified: Secondary | ICD-10-CM | POA: Diagnosis present

## 2023-02-13 DIAGNOSIS — E781 Pure hyperglyceridemia: Secondary | ICD-10-CM | POA: Diagnosis not present

## 2023-02-13 DIAGNOSIS — I69354 Hemiplegia and hemiparesis following cerebral infarction affecting left non-dominant side: Secondary | ICD-10-CM | POA: Diagnosis not present

## 2023-02-13 DIAGNOSIS — Z8711 Personal history of peptic ulcer disease: Secondary | ICD-10-CM

## 2023-02-13 DIAGNOSIS — I10 Essential (primary) hypertension: Secondary | ICD-10-CM | POA: Diagnosis not present

## 2023-02-13 DIAGNOSIS — Z7189 Other specified counseling: Secondary | ICD-10-CM | POA: Diagnosis not present

## 2023-02-13 DIAGNOSIS — Z7401 Bed confinement status: Secondary | ICD-10-CM | POA: Diagnosis not present

## 2023-02-13 DIAGNOSIS — Z87891 Personal history of nicotine dependence: Secondary | ICD-10-CM

## 2023-02-13 DIAGNOSIS — R8271 Bacteriuria: Secondary | ICD-10-CM | POA: Diagnosis present

## 2023-02-13 DIAGNOSIS — R54 Age-related physical debility: Secondary | ICD-10-CM | POA: Diagnosis present

## 2023-02-13 DIAGNOSIS — Z823 Family history of stroke: Secondary | ICD-10-CM

## 2023-02-13 DIAGNOSIS — E1122 Type 2 diabetes mellitus with diabetic chronic kidney disease: Secondary | ICD-10-CM | POA: Diagnosis present

## 2023-02-13 DIAGNOSIS — Z803 Family history of malignant neoplasm of breast: Secondary | ICD-10-CM

## 2023-02-13 DIAGNOSIS — Z711 Person with feared health complaint in whom no diagnosis is made: Secondary | ICD-10-CM | POA: Diagnosis not present

## 2023-02-13 DIAGNOSIS — M6281 Muscle weakness (generalized): Secondary | ICD-10-CM | POA: Diagnosis not present

## 2023-02-13 DIAGNOSIS — Z825 Family history of asthma and other chronic lower respiratory diseases: Secondary | ICD-10-CM

## 2023-02-13 DIAGNOSIS — K219 Gastro-esophageal reflux disease without esophagitis: Secondary | ICD-10-CM | POA: Diagnosis present

## 2023-02-13 DIAGNOSIS — R4182 Altered mental status, unspecified: Secondary | ICD-10-CM | POA: Diagnosis not present

## 2023-02-13 DIAGNOSIS — R531 Weakness: Secondary | ICD-10-CM | POA: Diagnosis not present

## 2023-02-13 DIAGNOSIS — Z794 Long term (current) use of insulin: Secondary | ICD-10-CM | POA: Diagnosis not present

## 2023-02-13 DIAGNOSIS — Z7984 Long term (current) use of oral hypoglycemic drugs: Secondary | ICD-10-CM

## 2023-02-13 DIAGNOSIS — R4781 Slurred speech: Secondary | ICD-10-CM | POA: Diagnosis not present

## 2023-02-13 DIAGNOSIS — Z8249 Family history of ischemic heart disease and other diseases of the circulatory system: Secondary | ICD-10-CM

## 2023-02-13 DIAGNOSIS — R051 Acute cough: Secondary | ICD-10-CM | POA: Diagnosis not present

## 2023-02-13 DIAGNOSIS — I700 Atherosclerosis of aorta: Secondary | ICD-10-CM | POA: Diagnosis not present

## 2023-02-13 DIAGNOSIS — Z9889 Other specified postprocedural states: Secondary | ICD-10-CM

## 2023-02-13 DIAGNOSIS — E119 Type 2 diabetes mellitus without complications: Secondary | ICD-10-CM | POA: Diagnosis present

## 2023-02-13 LAB — URINALYSIS, W/ REFLEX TO CULTURE (INFECTION SUSPECTED)
Bilirubin Urine: NEGATIVE
Glucose, UA: NEGATIVE mg/dL
Hgb urine dipstick: NEGATIVE
Ketones, ur: NEGATIVE mg/dL
Nitrite: NEGATIVE
Protein, ur: 100 mg/dL — AB
Specific Gravity, Urine: 1.018 (ref 1.005–1.030)
WBC, UA: >50 WBC/hpf (ref 0–5)
pH: 5 (ref 5.0–8.0)

## 2023-02-13 LAB — CBC WITH DIFFERENTIAL/PLATELET
Abs Immature Granulocytes: 0.15 10*3/uL — ABNORMAL HIGH (ref 0.00–0.07)
Basophils Absolute: 0 10*3/uL (ref 0.0–0.1)
Basophils Relative: 0 %
Eosinophils Absolute: 0.2 10*3/uL (ref 0.0–0.5)
Eosinophils Relative: 1 %
HCT: 29 % — ABNORMAL LOW (ref 36.0–46.0)
Hemoglobin: 9.1 g/dL — ABNORMAL LOW (ref 12.0–15.0)
Immature Granulocytes: 1 %
Lymphocytes Relative: 7 %
Lymphs Abs: 1.2 10*3/uL (ref 0.7–4.0)
MCH: 31.4 pg (ref 26.0–34.0)
MCHC: 31.4 g/dL (ref 30.0–36.0)
MCV: 100 fL (ref 80.0–100.0)
Monocytes Absolute: 1.7 10*3/uL — ABNORMAL HIGH (ref 0.1–1.0)
Monocytes Relative: 9 %
Neutro Abs: 15 10*3/uL — ABNORMAL HIGH (ref 1.7–7.7)
Neutrophils Relative %: 82 %
Platelets: 198 10*3/uL (ref 150–400)
RBC: 2.9 MIL/uL — ABNORMAL LOW (ref 3.87–5.11)
RDW: 17.8 % — ABNORMAL HIGH (ref 11.5–15.5)
WBC: 18.2 10*3/uL — ABNORMAL HIGH (ref 4.0–10.5)
nRBC: 0 % (ref 0.0–0.2)

## 2023-02-13 LAB — COMPREHENSIVE METABOLIC PANEL
ALT: 31 U/L (ref 0–44)
AST: 42 U/L — ABNORMAL HIGH (ref 15–41)
Albumin: 2.6 g/dL — ABNORMAL LOW (ref 3.5–5.0)
Alkaline Phosphatase: 61 U/L (ref 38–126)
Anion gap: 10 (ref 5–15)
BUN: 48 mg/dL — ABNORMAL HIGH (ref 8–23)
CO2: 27 mmol/L (ref 22–32)
Calcium: 8.9 mg/dL (ref 8.9–10.3)
Chloride: 98 mmol/L (ref 98–111)
Creatinine, Ser: 1.46 mg/dL — ABNORMAL HIGH (ref 0.44–1.00)
GFR, Estimated: 35 mL/min — ABNORMAL LOW (ref >60)
Glucose, Bld: 77 mg/dL (ref 70–99)
Potassium: 3.8 mmol/L (ref 3.5–5.1)
Sodium: 135 mmol/L (ref 135–145)
Total Bilirubin: 0.5 mg/dL (ref 0.3–1.2)
Total Protein: 6.6 g/dL (ref 6.5–8.1)

## 2023-02-13 LAB — PROTIME-INR
INR: 1.2 (ref 0.8–1.2)
Prothrombin Time: 15 seconds (ref 11.4–15.2)

## 2023-02-13 LAB — I-STAT CG4 LACTIC ACID, ED
Lactic Acid, Venous: 2 mmol/L (ref 0.5–1.9)
Lactic Acid, Venous: 1.4 mmol/L (ref 0.5–1.9)

## 2023-02-13 LAB — LIPASE, BLOOD: Lipase: 49 U/L (ref 11–51)

## 2023-02-13 LAB — APTT: aPTT: 34 seconds (ref 24–36)

## 2023-02-13 LAB — SARS CORONAVIRUS 2 BY RT PCR: SARS Coronavirus 2 by RT PCR: NEGATIVE

## 2023-02-13 MED ORDER — SODIUM CHLORIDE 0.9 % IV SOLN
2.0000 g | INTRAVENOUS | Status: DC
  Administered 2023-02-13: 2 g via INTRAVENOUS
  Filled 2023-02-13: qty 20

## 2023-02-13 MED ORDER — LACTATED RINGERS IV BOLUS
1000.0000 mL | Freq: Once | INTRAVENOUS | Status: AC
  Administered 2023-02-13: 1000 mL via INTRAVENOUS

## 2023-02-13 MED ORDER — SODIUM CHLORIDE 0.9 % IV SOLN
500.0000 mg | INTRAVENOUS | Status: DC
  Administered 2023-02-13: 500 mg via INTRAVENOUS
  Filled 2023-02-13: qty 5

## 2023-02-13 MED ORDER — LACTATED RINGERS IV SOLN: INTRAVENOUS | Status: AC

## 2023-02-13 NOTE — ED Triage Notes (Addendum)
Pt present to ED from [facility] with noted hypotension, decreased urine and bowel output. Per the facility, pt noted with AMS two hours prior EMS arrival. Pt alert, verbally limited at this time.  EMS VS: 72/50, 96%b 2L O2, LR

## 2023-02-13 NOTE — ED Provider Notes (Signed)
[facility] AT [facility] Provider Note   CSN: [id number] Arrival date & time: 02/13/23  1557     History  Chief Complaint  Patient presents with   Hypotension    JANNEY HENLY is a 87 y.o. female.  Patient is an 87 year old female with PMH CVA with L-sided deficits and aphasia, G-tube and indwelling foley dependent, HTN, DM presenting to the emergency department for AMS and hypotension. Patient is complaining of sore throat, denies chest or abdominal pain however history is limited due to dysarthria. Per NH staff at [facility]: Did not void overnight or during this shift Towards the end of the shift wasn't responding to them and blood pressure and sugar were low Does not normally have a foley, attempted and in and out Had a fever earlier this morning of 101 No vomiting Would not eat breakfast or lunch. Has a soft diet by mouth, G-tube feeds overnight  Cough started on Wednesday - CXR was negative at that time.  The history is limited by the condition of the patient.       Home Medications Prior to Admission medications   Medication Sig Start Date End Date Taking? Authorizing Provider  acetaminophen (TYLENOL) 160 MG/5ML solution Place 20.3 mLs (650 mg total) into feeding tube every 8 (eight) hours. 08/22/22   Setzer, Lynnell Jude, PA-C  amLODipine (NORVASC) 10 MG tablet Place 1 tablet (10 mg total) into feeding tube daily. 08/23/22   Setzer, Lynnell Jude, PA-C  ascorbic acid (VITAMIN C) 500 MG tablet Place 1 tablet (500 mg total) into feeding tube daily. 08/23/22   Setzer, Lynnell Jude, PA-C  atorvastatin (LIPITOR) 80 MG tablet Place 1 tablet (80 mg total) into feeding tube daily. 08/23/22   Setzer, Lynnell Jude, PA-C  carvedilol (COREG) 6.25 MG tablet Place 1 tablet (6.25 mg total) into feeding tube 2 (two) times daily with a meal. 08/22/22   Setzer, Lynnell Jude, PA-C  diclofenac Sodium (VOLTAREN) 1 % GEL Apply 2 g topically 4 (four) times daily. 08/22/22   Setzer,  Lynnell Jude, PA-C  ezetimibe (ZETIA) 10 MG tablet Place 1 tablet (10 mg total) into feeding tube daily. 08/23/22   Setzer, Lynnell Jude, PA-C  famotidine (PEPCID) 20 MG tablet Place 1 tablet (20 mg total) into feeding tube 2 (two) times daily. 08/22/22   Setzer, Lynnell Jude, PA-C  ferrous sulfate 300 (60 Fe) MG/5ML syrup Place 5 mLs (300 mg total) into feeding tube daily with breakfast. 08/23/22   Setzer, Lynnell Jude, PA-C  FLUoxetine (PROZAC) 20 MG capsule Place 1 capsule (20 mg total) into feeding tube at bedtime. 08/22/22   Setzer, Lynnell Jude, PA-C  gabapentin (NEURONTIN) 250 MG/5ML solution Place 2 mLs (100 mg total) into feeding tube every 8 (eight) hours. 08/22/22   Setzer, Lynnell Jude, PA-C  insulin aspart (NOVOLOG) 100 UNIT/ML injection Inject 0-15 Units into the skin 3 (three) times daily with meals. 08/22/22   Setzer, Lynnell Jude, PA-C  insulin aspart (NOVOLOG) 100 UNIT/ML injection Inject 0-5 Units into the skin at bedtime. 08/22/22   Setzer, Lynnell Jude, PA-C  l-methylfolate-B6-B12 (METANX) 3-35-2 MG TABS tablet Place 1 tablet into feeding tube daily. 08/23/22   Setzer, Lynnell Jude, PA-C  levETIRAcetam (KEPPRA) 100 MG/ML solution Place 5 mLs (500 mg total) into feeding tube 2 (two) times daily. 01/22/23   Micki Riley, MD  lidocaine (LIDODERM) 5 % Place 1 patch onto the skin daily. Remove & Discard patch within 12 hours or  as directed by MD 08/23/22   Valetta Fuller, Lynnell Jude, PA-C  metFORMIN (GLUCOPHAGE) 500 MG tablet Place 1 tablet (500 mg total) into feeding tube 2 (two) times daily with a meal. 08/22/22   Setzer, Lynnell Jude, PA-C  nutrition supplement, JUVEN, (JUVEN) PACK Place 1 packet into feeding tube 2 (two) times daily between meals. 08/22/22   Setzer, Lynnell Jude, PA-C  Nutritional Supplements (FEEDING SUPPLEMENT, GLUCERNA 1.5 CAL,) LIQD Place 237 mLs into feeding tube 5 (five) times daily. 08/22/22   Setzer, Lynnell Jude, PA-C  nystatin (MYCOSTATIN) 100000 UNIT/ML suspension Use as directed 5 mLs (500,000 Units total) in the mouth  or throat 4 (four) times daily. 08/22/22   Setzer, Lynnell Jude, PA-C  polyethylene glycol (MIRALAX / GLYCOLAX) 17 g packet Place 17 g into feeding tube daily as needed for mild constipation. 08/22/22   Setzer, Lynnell Jude, PA-C  polyvinyl alcohol (LIQUIFILM TEARS) 1.4 % ophthalmic solution Place 1 drop into both eyes as needed for dry eyes. 08/22/22   Setzer, Lynnell Jude, PA-C  Water For Irrigation, Sterile (FREE WATER) SOLN Place 120 mLs into feeding tube 5 (five) times daily. 08/22/22   Setzer, Lynnell Jude, PA-C      Allergies    Enalapril, Losartan, and Chlorhexidine    Review of Systems   Review of Systems  Physical Exam Updated Vital Signs BP (!) 108/57   Pulse 72   Temp (!) 97.5 F (36.4 C) (Oral)   Resp 17   Ht 5\' 3"  (1.6 m)   Wt 54 kg   SpO2 98%   BMI 21.09 kg/m  Physical Exam Vitals and nursing note reviewed.  Constitutional:      General: She is not in acute distress.    Appearance: Normal appearance.  HENT:     Head: Normocephalic and atraumatic.     Nose: Nose normal.     Mouth/Throat:     Mouth: Mucous membranes are moist.     Comments: Thick oral secretions Eyes:     Extraocular Movements: Extraocular movements intact.     Conjunctiva/sclera: Conjunctivae normal.  Cardiovascular:     Rate and Rhythm: Normal rate and regular rhythm.     Heart sounds: Normal heart sounds.  Pulmonary:     Effort: Pulmonary effort is normal.     Breath sounds: Rhonchi (diffuse) present.  Abdominal:     General: Abdomen is flat.     Palpations: Abdomen is soft.     Tenderness: There is no abdominal tenderness.  Genitourinary:    Comments: Foley catheter in place draining yellow urine Musculoskeletal:     Cervical back: Normal range of motion and neck supple.     Right lower leg: No edema.     Left lower leg: No edema.  Skin:    General: Skin is warm and dry.  Neurological:     Mental Status: She is alert.     Comments: Appears to be oriented though limited with dysarthria L-sided  deficits appear to be at baseline  Psychiatric:        Mood and Affect: Mood normal.     ED Results / Procedures / Treatments   Labs (all labs ordered are listed, but only abnormal results are displayed) Labs Reviewed  COMPREHENSIVE METABOLIC PANEL - Abnormal; Notable for the following components:      Result Value   BUN 48 (*)    Creatinine, Ser 1.46 (*)    Albumin 2.6 (*)    AST 42 (*)  GFR, Estimated 35 (*)    All other components within normal limits  CBC WITH DIFFERENTIAL/PLATELET - Abnormal; Notable for the following components:   WBC 18.2 (*)    RBC 2.90 (*)    Hemoglobin 9.1 (*)    HCT 29.0 (*)    RDW 17.8 (*)    Neutro Abs 15.0 (*)    Monocytes Absolute 1.7 (*)    Abs Immature Granulocytes 0.15 (*)    All other components within normal limits  I-STAT CG4 LACTIC ACID, ED - Abnormal; Notable for the following components:   Lactic Acid, Venous 2.0 (*)    All other components within normal limits  SARS CORONAVIRUS 2 BY RT PCR  CULTURE, BLOOD (ROUTINE X 2)  CULTURE, BLOOD (ROUTINE X 2)  PROTIME-INR  APTT  LIPASE, BLOOD  URINALYSIS, W/ REFLEX TO CULTURE (INFECTION SUSPECTED)  CBG MONITORING, ED  I-STAT CG4 LACTIC ACID, ED    EKG None  Radiology CT Head Wo Contrast  Result Date: 02/13/2023 CLINICAL DATA:  Mental status change, unknown cause EXAM: CT HEAD WITHOUT CONTRAST TECHNIQUE: Contiguous axial images were obtained from the base of the skull through the vertex without intravenous contrast. RADIATION DOSE REDUCTION: This exam was performed according to the departmental dose-optimization program which includes automated exposure control, adjustment of the mA and/or kV according to patient size and/or use of iterative reconstruction technique. COMPARISON:  CT head 01/06/2023. FINDINGS: Brain: Similar right frontal encephalomalacia. Remote left basal ganglia perforator infarct. No evidence of acute large vascular territory infarct, acute hemorrhage, mass lesion,  midline shift or hydrocephalus. Vascular: No hyperdense vessel.  Calcific atherosclerosis. Skull: No acute fracture.  Right pterional craniotomy. Sinuses/Orbits: Clear sinuses.  No acute findings. Other: No mastoid effusions. IMPRESSION: 1. No evidence of acute intracranial abnormality. 2. Similar right frontal encephalomalacia and remote left basal ganglia perforator infarct. Electronically Signed   By: Feliberto Harts M.D.   On: 02/13/2023 17:32   DG Chest Port 1 View  Result Date: 02/13/2023 CLINICAL DATA:  Questionable sepsis EXAM: PORTABLE CHEST 1 VIEW COMPARISON:  01/06/2023 FINDINGS: No pneumothorax or effusion. Normal cardiopericardial silhouette. Calcified aorta. No edema. Overlapping cardiac leads. There are some patchy parenchymal infiltrative opacity seen in the right midlung, medial right lung base and left retrocardiac. Degenerative changes seen along the spine. Degenerative changes of the shoulders. IMPRESSION: Multifocal infiltrative opacities identified bilaterally. Recommend follow-up to confirm clearance. Possible infection or pneumonia. Electronically Signed   By: Karen Kays M.D.   On: 02/13/2023 17:14    Procedures .Critical Care  Performed by: Rexford Maus, DO Authorized by: Rexford Maus, DO   Critical care provider statement:    Critical care time (minutes):  40   Critical care time was exclusive of:  Separately billable procedures and treating other patients   Critical care was necessary to treat or prevent imminent or life-threatening deterioration of the following conditions:  Sepsis   Critical care was time spent personally by me on the following activities:  Development of treatment plan with patient or surrogate, discussions with consultants, evaluation of patient's response to treatment, examination of patient, obtaining history from patient or surrogate, ordering and performing treatments and interventions, ordering and review of laboratory studies,  ordering and review of radiographic studies, pulse oximetry, re-evaluation of patient's condition and review of old charts   I assumed direction of critical care for this patient from another provider in my specialty: no     Care discussed with: admitting provider  Medications Ordered in ED Medications  lactated ringers infusion ( Intravenous Bolus 02/13/23 1800)  cefTRIAXone (ROCEPHIN) 2 g in sodium chloride 0.9 % 100 mL IVPB (2 g Intravenous New Bag/Given 02/13/23 1802)  azithromycin (ZITHROMAX) 500 mg in sodium chloride 0.9 % 250 mL IVPB (has no administration in time range)  lactated ringers bolus 1,000 mL (1,000 mLs Intravenous Bolus 02/13/23 1742)    ED Course/ Medical Decision Making/ A&P Clinical Course as of 02/13/23 1839  Fri Feb 13, 2023  1725 CXR with bilateral infiltrates concerning for pneumonia in the setting of cough and fever. Abx ordered. [VK]  1838 Mild AKI on labs. Patient will be admitted to the hospitalist for her sepsis. [VK]    Clinical Course User Index [VK] Rexford Maus, DO                                 Medical Decision Making This patient presents to the ED with chief complaint(s) of AMS, decreased urination with pertinent past medical history of CVA with residual dysarthria, dysphagia and left-sided deficits, G-tube dependent, hypertension, diabetes which further complicates the presenting complaint. The complaint involves an extensive differential diagnosis and also carries with it a high risk of complications and morbidity.    The differential diagnosis includes sepsis, dehydration, electrolyte abnormality, kidney dysfunction, UTI, pneumonia, pneumothorax, pulmonary edema, pleural effusion, viral syndrome, arrhythmia, anemia  Additional history obtained: Additional history obtained from EMS  and nursing home/care facility Records reviewed Nursing Home Documents and neurology records  ED Course and Reassessment: On patient's arrival she is  mildly soft blood pressures in the 100s but otherwise is well-appearing and appears to be at her neurologic baseline.  She is actively coughing on exam with coarse breath sounds.  With fever earlier in the day at the nursing home and low blood pressures concern for possible sepsis and she will undergo workup to evaluate for possible sepsis.  She will additionally workup to evaluate further causes of her altered mental status and urinary retention.  Independent labs interpretation:  The following labs were independently interpreted: leukocytosis, AKI  Independent visualization of imaging: - I independently visualized the following imaging with scope of interpretation limited to determining acute life threatening conditions related to emergency care: CXR, which revealed multifocal pneumonia  Consultation: - Consulted or discussed management/test interpretation w/ external professional: hospitalist  Consideration for admission or further workup: patient requires admission for sepsis Social Determinants of health: N/A    Amount and/or Complexity of Data Reviewed Labs: ordered. Radiology: ordered. ECG/medicine tests: ordered.  Risk Prescription drug management. Decision regarding hospitalization.          Final Clinical Impression(s) / ED Diagnoses Final diagnoses:  Multifocal pneumonia  Sepsis with acute renal failure without septic shock, due to unspecified organism, unspecified acute renal failure type [facility])    Rx / DC Orders ED Discharge Orders     None         Rexford Maus, DO 02/13/23 1839

## 2023-02-13 NOTE — Sepsis Progress Note (Signed)
eLink is following this Code Sepsis. °

## 2023-02-13 NOTE — H&P (Addendum)
History and Physical   [facility] - [place] @ [facility] Admission History and Physical [employer], D.O.    Patient Name: Gina Kelley MR#: [id number] Date of Birth: 07/23/1935 Date of Admission: 02/13/2023  Referring MD/NP/PA: Dr. Theresia Lo Primary Care Physician: No primary care provider on file.  Chief Complaint:  Chief Complaint  Patient presents with   Hypotension  Please note the entire history is obtained from the patient's emergency department chart, emergency department provider and the patient's family who is at the bedside. Patient's personal history is limited by dysarthria and altered mental status.   HPI: Gina Kelley is a 87 y.o. female with a known history of anemia, CVA with residual L sided deficits and dysarthria, hypertension, GERD, hyperlipidemia, type 2 diabetes presents to the emergency department for evaluation of altered mental status and hypotension.  Patient was in a usual state of health until today when she was noted at her facility to have altered mental status, temp to 101 as well as decreased p.o. intake and decreased urine and stool output overnight.  It was reported that she was hypotensive and hypoglycemic at her facility.  She has also had cough for the last 2 days with negative chest x-ray   Of note patient has indwelling Foley and G-tube.  Usually eats a soft diet during the day and tube feeds at night   EMS/ED Course: Patient received Rocephin, azithromycin, lactated Ringer's. Medical admission has been requested for further management of sepsis secondary to multifocal pneumonia.  Review of Systems:  Able to obtain secondary to altered mental status   Past Medical History:  Diagnosis Date   Anemia of chronic disease 07/08/2006   Antral ulcer 02/23/2007   Seen on EGD in 2008, small ulcer with erosion    Cerebral vascular accident (HCC) 04/14/2006   1998, left lower extremity numbness, no residual deficits    Essential  hypertension 04/14/2006   Gastroesophageal reflux disease 02/18/2013   Occasional, symptomatically relieved with peptobismol    Healthcare maintenance 12/04/2011   Hyperlipidemia LDL goal < 100 04/14/2006   Hypertensive retinopathy of both eyes, grade 1 05/15/2016   Osteopenia of right femoral neck 10/20/2016   DEXA (10/16/2016): R femur T -2.5 (FRAX tool calculates at -2.4), L1-L4 spine T -0.9, 10 year risk for: Major osteoporotic fracture 8.3%, Hip fracture 2.7%   Type 2 diabetes mellitus with stage 1 chronic kidney disease, without long-term current use of insulin (HCC)    Type II diabetes mellitus (HCC) 04/14/2006    Past Surgical History:  Procedure Laterality Date   COLONOSCOPY     CRANIOTOMY Right 07/08/2022   Procedure: RIGHT CRANIOTOMY HEMATOMA EVACUATION SUBDURAL;  Surgeon: Julio Sicks, MD;  Location: [facility] OR;  Service: Neurosurgery;  Laterality: Right;   ESOPHAGOGASTRODUODENOSCOPY     ESOPHAGOGASTRODUODENOSCOPY (EGD) WITH PROPOFOL N/A 08/01/2022   Procedure: ESOPHAGOGASTRODUODENOSCOPY (EGD) WITH PROPOFOL;  Surgeon: Diamantina Monks, MD;  Location: [facility] ENDOSCOPY;  Service: General;  Laterality: N/A;   PEG PLACEMENT N/A 08/01/2022   Procedure: PERCUTANEOUS ENDOSCOPIC GASTROSTOMY (PEG) PLACEMENT;  Surgeon: Diamantina Monks, MD;  Location: [facility] ENDOSCOPY;  Service: General;  Laterality: N/A;     reports that she quit smoking about 44 years ago. Her smoking use included cigarettes. She has never used smokeless tobacco. She reports that she does not drink alcohol and does not use drugs.  Allergies  Allergen Reactions   Cozaar [Losartan] Swelling    Angioedema    Periogard [Chlorhexidine Gluconate] Swelling and Other (See Comments)  Skin irritation Eye/tongue/lip swelling   Pork Allergy Other (See Comments)    Unknown reaction   Vasotec [Enalapril] Cough    Family History  Problem Relation Age of Onset   Stroke Mother    Diabetes Mother    Hypertension Mother    Anuerysm Father  4       Cerebral   Asthma Sister    Breast cancer Sister    Pulmonary disease Brother        Black lung   Stroke Sister    Cirrhosis Brother    Hypertension Son    Asthma Brother     Prior to Admission medications   Medication Sig Start Date End Date Taking? Authorizing Provider  amLODipine (NORVASC) 10 MG tablet Place 1 tablet (10 mg total) into feeding tube daily. 08/23/22  Yes Setzer, Lynnell Jude, PA-C  ascorbic acid (VITAMIN C) 500 MG tablet Place 1 tablet (500 mg total) into feeding tube daily. 08/23/22  Yes Setzer, Lynnell Jude, PA-C  atorvastatin (LIPITOR) 80 MG tablet Place 1 tablet (80 mg total) into feeding tube daily. 08/23/22  Yes Setzer, Lynnell Jude, PA-C  ferrous sulfate 300 (60 Fe) MG/5ML syrup Place 5 mLs (300 mg total) into feeding tube daily with breakfast. Patient taking differently: Place 408 mg into feeding tube daily. 08/23/22  Yes Setzer, Lynnell Jude, PA-C  FLUoxetine (PROZAC) 20 MG capsule Place 1 capsule (20 mg total) into feeding tube at bedtime. 08/22/22  Yes Setzer, Lynnell Jude, PA-C  guaiFENesin (ROBITUSSIN) 100 MG/5ML liquid Place 200 mg into feeding tube 4 (four) times daily.   Yes [provider]  l-methylfolate-B6-B12 (METANX) 3-35-2 MG TABS tablet Place 1 tablet into feeding tube daily. 08/23/22  Yes Setzer, Lynnell Jude, PA-C  lidocaine 4 % Place 1 patch onto the skin daily.   Yes [provider]  ondansetron (ZOFRAN) 4 MG tablet Take 4 mg by mouth every 8 (eight) hours as needed for nausea or vomiting.   Yes [provider]  acetaminophen (TYLENOL) 160 MG/5ML solution Place 20.3 mLs (650 mg total) into feeding tube every 8 (eight) hours. 08/22/22   Setzer, Lynnell Jude, PA-C  carvedilol (COREG) 6.25 MG tablet Place 1 tablet (6.25 mg total) into feeding tube 2 (two) times daily with a meal. 08/22/22   Setzer, Lynnell Jude, PA-C  diclofenac Sodium (VOLTAREN) 1 % GEL Apply 2 g topically 4 (four) times daily. 08/22/22   Setzer, Lynnell Jude, PA-C  ezetimibe (ZETIA) 10 MG  tablet Place 1 tablet (10 mg total) into feeding tube daily. 08/23/22   Setzer, Lynnell Jude, PA-C  famotidine (PEPCID) 20 MG tablet Place 1 tablet (20 mg total) into feeding tube 2 (two) times daily. 08/22/22   Setzer, Lynnell Jude, PA-C  gabapentin (NEURONTIN) 250 MG/5ML solution Place 2 mLs (100 mg total) into feeding tube every 8 (eight) hours. 08/22/22   Setzer, Lynnell Jude, PA-C  levETIRAcetam (KEPPRA) 100 MG/ML solution Place 5 mLs (500 mg total) into feeding tube 2 (two) times daily. 01/22/23   Micki Riley, MD  metFORMIN (GLUCOPHAGE) 500 MG tablet Place 1 tablet (500 mg total) into feeding tube 2 (two) times daily with a meal. 08/22/22   Setzer, Lynnell Jude, PA-C  nutrition supplement, JUVEN, (JUVEN) PACK Place 1 packet into feeding tube 2 (two) times daily between meals. 08/22/22   Setzer, Lynnell Jude, PA-C  Nutritional Supplements (FEEDING SUPPLEMENT, GLUCERNA 1.5 CAL,) LIQD Place 237 mLs into feeding tube 5 (five) times daily. 08/22/22   Setzer, Lynnell Jude,  PA-C  nystatin (MYCOSTATIN) 100000 UNIT/ML suspension Use as directed 5 mLs (500,000 Units total) in the mouth or throat 4 (four) times daily. 08/22/22   Setzer, Lynnell Jude, PA-C  polyethylene glycol (MIRALAX / GLYCOLAX) 17 g packet Place 17 g into feeding tube daily as needed for mild constipation. 08/22/22   Setzer, Lynnell Jude, PA-C  polyvinyl alcohol (LIQUIFILM TEARS) 1.4 % ophthalmic solution Place 1 drop into both eyes as needed for dry eyes. 08/22/22   Setzer, Lynnell Jude, PA-C  Water For Irrigation, Sterile (FREE WATER) SOLN Place 120 mLs into feeding tube 5 (five) times daily. 08/22/22   Milinda Antis, PA-C    Physical Exam: Vitals:   02/13/23 1745 02/13/23 1800 02/13/23 1830 02/13/23 1900  BP: 97/73 (!) 108/57 (!) 117/55 113/72  Pulse:      Resp: 17 17 16 14   Temp:      TempSrc:      SpO2:      Weight:      Height:        GENERAL: 87 y.o.-year-old black female patient, chronically ill-appearing lying in the bed in no acute distress.  Pleasant and  cooperative.   HEENT: Head atraumatic, normocephalic. Pupils equal. Mucus membranes moist NECK: Supple. No JVD. CHEST: Coarse breath sounds audible without stethoscope. No use of accessory muscles of respiration.  No reproducible chest wall tenderness.  CARDIOVASCULAR: S1, S2 normal. No murmurs, rubs, or gallops. Cap refill <2 seconds. Pulses intact distally.  ABDOMEN: Foley catheter in place draining clear yellow urine G-tube in place soft, nondistended, nontender. No rebound, guarding, rigidity. Normoactive bowel sounds present in all four quadrants.  EXTREMITIES: No pedal edema, cyanosis, or clubbing. No calf tenderness or Homan's sign.  NEUROLOGIC: The patient is awake and alert, follows commands significant dysarthria PSYCHIATRIC:  Normal affect, mood, thought content. SKIN: Warm, dry, and intact without obvious rash, lesion, or ulcer.    Labs on Admission:  CBC: Recent Labs  Lab 02/13/23 1715  WBC 18.2*  NEUTROABS 15.0*  HGB 9.1*  HCT 29.0*  MCV 100.0  PLT 198   Basic Metabolic Panel: Recent Labs  Lab 02/13/23 1715  NA 135  K 3.8  CL 98  CO2 27  GLUCOSE 77  BUN 48*  CREATININE 1.46*  CALCIUM 8.9   GFR: Estimated Creatinine Clearance: 23.3 mL/min (A) (by C-G formula based on SCr of 1.46 mg/dL (H)). Liver Function Tests: Recent Labs  Lab 02/13/23 1715  AST 42*  ALT 31  ALKPHOS 61  BILITOT 0.5  PROT 6.6  ALBUMIN 2.6*   Recent Labs  Lab 02/13/23 1715  LIPASE 49   No results for input(s): "AMMONIA" in the last 168 hours. Coagulation Profile: Recent Labs  Lab 02/13/23 1715  INR 1.2   Cardiac Enzymes: No results for input(s): "CKTOTAL", "CKMB", "CKMBINDEX", "TROPONINI" in the last 168 hours. BNP (last 3 results) No results for input(s): "PROBNP" in the last 8760 hours. HbA1C: No results for input(s): "HGBA1C" in the last 72 hours. CBG: No results for input(s): "GLUCAP" in the last 168 hours. Lipid Profile: No results for input(s): "CHOL", "HDL",  "LDLCALC", "TRIG", "CHOLHDL", "LDLDIRECT" in the last 72 hours. Thyroid Function Tests: No results for input(s): "TSH", "T4TOTAL", "FREET4", "T3FREE", "THYROIDAB" in the last 72 hours. Anemia Panel: No results for input(s): "VITAMINB12", "FOLATE", "FERRITIN", "TIBC", "IRON", "RETICCTPCT" in the last 72 hours. Urine analysis:    Component Value Date/Time   COLORURINE YELLOW 02/13/2023 1750   APPEARANCEUR CLOUDY (A) 02/13/2023 1750  LABSPEC 1.018 02/13/2023 1750   PHURINE 5.0 02/13/2023 1750   GLUCOSEU NEGATIVE 02/13/2023 1750   HGBUR NEGATIVE 02/13/2023 1750   BILIRUBINUR NEGATIVE 02/13/2023 1750   KETONESUR NEGATIVE 02/13/2023 1750   PROTEINUR 100 (A) 02/13/2023 1750   NITRITE NEGATIVE 02/13/2023 1750   LEUKOCYTESUR LARGE (A) 02/13/2023 1750   Sepsis Labs: @LABRCNTIP (procalcitonin:4,lacticidven:4) ) Recent Results (from the past 240 hour(s))  SARS Coronavirus 2 by RT PCR (hospital order, performed in [facility] hospital lab) *cepheid single result test* Anterior Nasal Swab     Status: None   Collection Time: 02/13/23  4:38 PM   Specimen: Anterior Nasal Swab  Result Value Ref Range Status   SARS Coronavirus 2 by RT PCR NEGATIVE NEGATIVE Final    Comment: Performed at [facility], [address], [place], [place] [address]     Radiological Exams on Admission: CT Head Wo Contrast  Result Date: 02/13/2023 CLINICAL DATA:  Mental status change, unknown cause EXAM: CT HEAD WITHOUT CONTRAST TECHNIQUE: Contiguous axial images were obtained from the base of the skull through the vertex without intravenous contrast. RADIATION DOSE REDUCTION: This exam was performed according to the departmental dose-optimization program which includes automated exposure control, adjustment of the mA and/or kV according to patient size and/or use of iterative reconstruction technique. COMPARISON:  CT head 01/06/2023. FINDINGS: Brain: Similar right frontal encephalomalacia. Remote left basal ganglia  perforator infarct. No evidence of acute large vascular territory infarct, acute hemorrhage, mass lesion, midline shift or hydrocephalus. Vascular: No hyperdense vessel.  Calcific atherosclerosis. Skull: No acute fracture.  Right pterional craniotomy. Sinuses/Orbits: Clear sinuses.  No acute findings. Other: No mastoid effusions. IMPRESSION: 1. No evidence of acute intracranial abnormality. 2. Similar right frontal encephalomalacia and remote left basal ganglia perforator infarct. Electronically Signed   By: Feliberto Harts M.D.   On: 02/13/2023 17:32   DG Chest Port 1 View  Result Date: 02/13/2023 CLINICAL DATA:  Questionable sepsis EXAM: PORTABLE CHEST 1 VIEW COMPARISON:  01/06/2023 FINDINGS: No pneumothorax or effusion. Normal cardiopericardial silhouette. Calcified aorta. No edema. Overlapping cardiac leads. There are some patchy parenchymal infiltrative opacity seen in the right midlung, medial right lung base and left retrocardiac. Degenerative changes seen along the spine. Degenerative changes of the shoulders. IMPRESSION: Multifocal infiltrative opacities identified bilaterally. Recommend follow-up to confirm clearance. Possible infection or pneumonia. Electronically Signed   By: Karen Kays M.D.   On: 02/13/2023 17:14    EKG: Normal sinus rhythm at 80 bpm with normal axis and nonspecific ST-T wave changes.   Assessment/Plan  This is a 87 y.o. female with a history of anemia, CVA with residual L sided deficits and dysarthria, hypertension, GERD, hyperlipidemia, type 2 diabetes now being admitted with:  #. AMS 2/2 Sepsis secondary to multifocal pneumonia - Admit to inpatient with telemetry monitoring - IV antibiotics: Ceftriaxone and azithromycin - IV fluid hydration -Will keep n.p.o. pending speech eval given concern for possible aspiration - Neurochecks - Follow up blood,urine & sputum cultures - Repeat CBC in am.   #. Acute kidney injury  - IV fluids and repeat BMP in AM.  -  Avoid nephrotoxic medications   #.  Anemia is near baseline - Monitor CBC -Continue ferrous sulfate CVA  #. History of hypertension - Continue Norvasc, carvedilol  #. History of hyperlipidemia - Continue atorvastatin  #. History of diabetes - Continue Accu-Cheks ACHS with insulin sliding scale coverage  #. History of GERD - Continue famotidine  #. History of CVA - Continue  gabapentin, levetiracetam  Admission status: Inpatient IV Fluids: NS Diet/Nutrition: NPO pending swallow Consults called: SLP  DVT Px: SCDs only d/t pork allergy Code Status: Full Code  Disposition Plan: To home in 1-2 days  All the records are reviewed and case discussed with ED provider. Management plans discussed with the patient and/or family who express understanding and agree with plan of care.  Mylo Choi D.O. on 02/13/2023 at 7:35 PM CC: Primary care physician; No primary care provider on file.   02/13/2023, 7:35 PM

## 2023-02-14 DIAGNOSIS — J189 Pneumonia, unspecified organism: Secondary | ICD-10-CM | POA: Diagnosis not present

## 2023-02-14 LAB — PHOSPHORUS: Phosphorus: 2.7 mg/dL (ref 2.5–4.6)

## 2023-02-14 LAB — COMPREHENSIVE METABOLIC PANEL
ALT: 30 U/L (ref 0–44)
AST: 48 U/L — ABNORMAL HIGH (ref 15–41)
Albumin: 2.6 g/dL — ABNORMAL LOW (ref 3.5–5.0)
Alkaline Phosphatase: 64 U/L (ref 38–126)
Anion gap: 12 (ref 5–15)
BUN: 26 mg/dL — ABNORMAL HIGH (ref 8–23)
CO2: 26 mmol/L (ref 22–32)
Calcium: 8.4 mg/dL — ABNORMAL LOW (ref 8.9–10.3)
Chloride: 99 mmol/L (ref 98–111)
Creatinine, Ser: 0.75 mg/dL (ref 0.44–1.00)
GFR, Estimated: >60 mL/min (ref >60)
Glucose, Bld: 76 mg/dL (ref 70–99)
Potassium: 3.5 mmol/L (ref 3.5–5.1)
Sodium: 137 mmol/L (ref 135–145)
Total Bilirubin: 0.6 mg/dL (ref 0.3–1.2)
Total Protein: 6.7 g/dL (ref 6.5–8.1)

## 2023-02-14 LAB — CBC
HCT: 32.3 % — ABNORMAL LOW (ref 36.0–46.0)
Hemoglobin: 10.1 g/dL — ABNORMAL LOW (ref 12.0–15.0)
MCH: 32 pg (ref 26.0–34.0)
MCHC: 31.3 g/dL (ref 30.0–36.0)
MCV: 102.2 fL — ABNORMAL HIGH (ref 80.0–100.0)
Platelets: 196 10*3/uL (ref 150–400)
RBC: 3.16 MIL/uL — ABNORMAL LOW (ref 3.87–5.11)
RDW: 17.3 % — ABNORMAL HIGH (ref 11.5–15.5)
WBC: 14.1 10*3/uL — ABNORMAL HIGH (ref 4.0–10.5)
nRBC: 0 % (ref 0.0–0.2)

## 2023-02-14 LAB — PROTIME-INR
INR: 1 (ref 0.8–1.2)
Prothrombin Time: 13.8 seconds (ref 11.4–15.2)

## 2023-02-14 LAB — MAGNESIUM: Magnesium: 1.6 mg/dL — ABNORMAL LOW (ref 1.7–2.4)

## 2023-02-14 LAB — I-STAT CG4 LACTIC ACID, ED: Lactic Acid, Venous: 1 mmol/L (ref 0.5–1.9)

## 2023-02-14 LAB — GLUCOSE, CAPILLARY
Glucose-Capillary: 93 mg/dL (ref 70–99)
Glucose-Capillary: 160 mg/dL — ABNORMAL HIGH (ref 70–99)
Glucose-Capillary: 132 mg/dL — ABNORMAL HIGH (ref 70–99)

## 2023-02-14 LAB — APTT: aPTT: 33 seconds (ref 24–36)

## 2023-02-14 LAB — HEMOGLOBIN A1C
Hgb A1c MFr Bld: 5.8 % — ABNORMAL HIGH (ref 4.8–5.6)
Mean Plasma Glucose: 119.76 mg/dL

## 2023-02-14 MED ORDER — SODIUM CHLORIDE 0.9% FLUSH
3.0000 mL | Freq: Two times a day (BID) | INTRAVENOUS | Status: DC
  Administered 2023-02-14 – 2023-02-18 (×8): 3 mL via INTRAVENOUS

## 2023-02-14 MED ORDER — SODIUM CHLORIDE 0.9 % IV SOLN
2.0000 g | INTRAVENOUS | Status: DC
  Administered 2023-02-14: 2 g via INTRAVENOUS
  Filled 2023-02-14 (×2): qty 20

## 2023-02-14 MED ORDER — TRAZODONE HCL 50 MG PO TABS
25.0000 mg | ORAL_TABLET | Freq: Every evening | ORAL | Status: DC | PRN
  Administered 2023-02-14 – 2023-02-16 (×3): 25 mg via ORAL
  Filled 2023-02-14 (×3): qty 1

## 2023-02-14 MED ORDER — HYDROCODONE-ACETAMINOPHEN 5-325 MG PO TABS: 1.0000 | ORAL_TABLET | ORAL | Status: DC | PRN

## 2023-02-14 MED ORDER — AZITHROMYCIN 500 MG PO TABS
500.0000 mg | ORAL_TABLET | Freq: Every day | ORAL | Status: DC
  Administered 2023-02-14 – 2023-02-15 (×2): 500 mg
  Filled 2023-02-14 (×2): qty 1

## 2023-02-14 MED ORDER — AMLODIPINE BESYLATE 10 MG PO TABS
10.0000 mg | ORAL_TABLET | Freq: Every day | ORAL | Status: DC
  Administered 2023-02-14 – 2023-02-18 (×5): 10 mg
  Filled 2023-02-14 (×2): qty 1
  Filled 2023-02-14: qty 2
  Filled 2023-02-14 (×2): qty 1

## 2023-02-14 MED ORDER — FERROUS SULFATE 300 (60 FE) MG/5ML PO SOLN
408.0000 mg | Freq: Every day | ORAL | Status: DC
  Administered 2023-02-14 – 2023-02-18 (×5): 408 mg
  Filled 2023-02-14 (×5): qty 10

## 2023-02-14 MED ORDER — ACETAMINOPHEN 325 MG PO TABS
650.0000 mg | ORAL_TABLET | Freq: Four times a day (QID) | ORAL | Status: DC | PRN
  Administered 2023-02-16: 650 mg via ORAL
  Filled 2023-02-14: qty 2

## 2023-02-14 MED ORDER — SODIUM CHLORIDE 0.9 % IV SOLN: 500.0000 mg | INTRAVENOUS | Status: DC

## 2023-02-14 MED ORDER — VITAMIN C 500 MG PO TABS
500.0000 mg | ORAL_TABLET | Freq: Every day | ORAL | Status: DC
  Administered 2023-02-14 – 2023-02-18 (×5): 500 mg
  Filled 2023-02-14 (×5): qty 1

## 2023-02-14 MED ORDER — CARVEDILOL 6.25 MG PO TABS
6.2500 mg | ORAL_TABLET | Freq: Two times a day (BID) | ORAL | Status: DC
  Administered 2023-02-15 – 2023-02-18 (×5): 6.25 mg
  Filled 2023-02-14 (×7): qty 1

## 2023-02-14 MED ORDER — FREE WATER
120.0000 mL | Freq: Every day | Status: DC
  Administered 2023-02-14 (×2): 120 mL

## 2023-02-14 MED ORDER — FREE WATER
120.0000 mL | Status: DC
  Administered 2023-02-14 – 2023-02-18 (×23): 120 mL

## 2023-02-14 MED ORDER — ACETAMINOPHEN 650 MG RE SUPP
650.0000 mg | Freq: Four times a day (QID) | RECTAL | Status: DC | PRN
  Administered 2023-02-14: 650 mg via RECTAL
  Filled 2023-02-14: qty 1

## 2023-02-14 MED ORDER — HYDRALAZINE HCL 20 MG/ML IJ SOLN: 5.0000 mg | Freq: Three times a day (TID) | INTRAMUSCULAR | Status: DC | PRN

## 2023-02-14 MED ORDER — BISACODYL 5 MG PO TBEC: 5.0000 mg | DELAYED_RELEASE_TABLET | Freq: Every day | ORAL | Status: DC | PRN

## 2023-02-14 MED ORDER — SENNOSIDES-DOCUSATE SODIUM 8.6-50 MG PO TABS: 1.0000 | ORAL_TABLET | Freq: Every evening | ORAL | Status: DC | PRN

## 2023-02-14 MED ORDER — L-METHYLFOLATE-B6-B12 3-35-2 MG PO TABS
1.0000 | ORAL_TABLET | Freq: Every day | ORAL | Status: DC
  Administered 2023-02-14 – 2023-02-18 (×5): 1
  Filled 2023-02-14 (×5): qty 1

## 2023-02-14 MED ORDER — ALBUTEROL SULFATE (2.5 MG/3ML) 0.083% IN NEBU: 2.5000 mg | INHALATION_SOLUTION | Freq: Four times a day (QID) | RESPIRATORY_TRACT | Status: DC | PRN

## 2023-02-14 MED ORDER — SODIUM CHLORIDE 0.9 % IV SOLN: INTRAVENOUS | Status: DC

## 2023-02-14 MED ORDER — FLUOXETINE HCL 20 MG PO CAPS
20.0000 mg | ORAL_CAPSULE | Freq: Every day | ORAL | Status: DC
  Administered 2023-02-14 – 2023-02-17 (×4): 20 mg
  Filled 2023-02-14 (×4): qty 1

## 2023-02-14 MED ORDER — IPRATROPIUM BROMIDE 0.02 % IN SOLN
0.5000 mg | Freq: Four times a day (QID) | RESPIRATORY_TRACT | Status: DC | PRN
  Administered 2023-02-16 – 2023-02-17 (×2): 0.5 mg via RESPIRATORY_TRACT
  Filled 2023-02-14 (×2): qty 2.5

## 2023-02-14 MED ORDER — FAMOTIDINE 20 MG PO TABS
20.0000 mg | ORAL_TABLET | Freq: Two times a day (BID) | ORAL | Status: DC
  Administered 2023-02-14 – 2023-02-18 (×9): 20 mg
  Filled 2023-02-14 (×9): qty 1

## 2023-02-14 MED ORDER — GABAPENTIN 250 MG/5ML PO SOLN
100.0000 mg | Freq: Three times a day (TID) | ORAL | Status: DC
  Administered 2023-02-14 – 2023-02-18 (×11): 100 mg
  Filled 2023-02-14 (×14): qty 2

## 2023-02-14 MED ORDER — ATORVASTATIN CALCIUM 80 MG PO TABS
80.0000 mg | ORAL_TABLET | Freq: Every day | ORAL | Status: DC
  Administered 2023-02-14 – 2023-02-18 (×5): 80 mg
  Filled 2023-02-14 (×2): qty 1
  Filled 2023-02-14: qty 2
  Filled 2023-02-14 (×2): qty 1

## 2023-02-14 MED ORDER — ONDANSETRON HCL 4 MG/2ML IJ SOLN: 4.0000 mg | Freq: Four times a day (QID) | INTRAMUSCULAR | Status: DC | PRN

## 2023-02-14 MED ORDER — GLUCERNA 1.5 CAL PO LIQD
1000.0000 mL | ORAL | Status: DC
  Administered 2023-02-14 – 2023-02-17 (×4): 1000 mL
  Filled 2023-02-14 (×5): qty 1000

## 2023-02-14 MED ORDER — INSULIN ASPART 100 UNIT/ML IJ SOLN
0.0000 [IU] | Freq: Three times a day (TID) | INTRAMUSCULAR | Status: DC
  Administered 2023-02-14 – 2023-02-15 (×3): 3 [IU] via SUBCUTANEOUS
  Administered 2023-02-15: 6 [IU] via SUBCUTANEOUS
  Administered 2023-02-15 – 2023-02-16 (×5): 3 [IU] via SUBCUTANEOUS
  Administered 2023-02-17 (×2): 6 [IU] via SUBCUTANEOUS
  Administered 2023-02-17 – 2023-02-18 (×3): 3 [IU] via SUBCUTANEOUS

## 2023-02-14 MED ORDER — INSULIN LISPRO (1 UNIT DIAL) 100 UNIT/ML (KWIKPEN): 0.0000 [IU] | PEN_INJECTOR | SUBCUTANEOUS | Status: DC

## 2023-02-14 MED ORDER — EZETIMIBE 10 MG PO TABS
10.0000 mg | ORAL_TABLET | Freq: Every day | ORAL | Status: DC
  Administered 2023-02-14 – 2023-02-17 (×4): 10 mg
  Filled 2023-02-14 (×4): qty 1

## 2023-02-14 MED ORDER — ADULT MULTIVITAMIN W/MINERALS CH
1.0000 | ORAL_TABLET | Freq: Every day | ORAL | Status: DC
  Administered 2023-02-15 – 2023-02-18 (×4): 1
  Filled 2023-02-14 (×5): qty 1

## 2023-02-14 MED ORDER — ONDANSETRON HCL 4 MG PO TABS: 4.0000 mg | ORAL_TABLET | Freq: Four times a day (QID) | ORAL | Status: DC | PRN

## 2023-02-14 MED ORDER — MORPHINE SULFATE (PF) 2 MG/ML IV SOLN: 1.0000 mg | Freq: Four times a day (QID) | INTRAVENOUS | Status: DC | PRN

## 2023-02-14 MED ORDER — GLUCERNA 1.5 CAL PO LIQD
237.0000 mL | Freq: Every day | ORAL | Status: DC
  Administered 2023-02-14: 237 mL
  Filled 2023-02-14 (×3): qty 237

## 2023-02-14 MED ORDER — LEVETIRACETAM 100 MG/ML PO SOLN
500.0000 mg | Freq: Two times a day (BID) | ORAL | Status: DC
  Administered 2023-02-14 – 2023-02-18 (×9): 500 mg
  Filled 2023-02-14 (×11): qty 5

## 2023-02-14 MED ORDER — PRESERVISION AREDS 2 PO CHEW: 1.0000 | CHEWABLE_TABLET | Freq: Two times a day (BID) | ORAL | Status: DC

## 2023-02-14 MED ORDER — GUAIFENESIN 100 MG/5ML PO LIQD
200.0000 mg | Freq: Four times a day (QID) | ORAL | Status: DC
  Administered 2023-02-14 – 2023-02-18 (×17): 200 mg
  Filled 2023-02-14 (×2): qty 10
  Filled 2023-02-14: qty 50
  Filled 2023-02-14: qty 10
  Filled 2023-02-14: qty 5
  Filled 2023-02-14 (×3): qty 10
  Filled 2023-02-14: qty 5
  Filled 2023-02-14 (×2): qty 10
  Filled 2023-02-14: qty 5
  Filled 2023-02-14 (×2): qty 10
  Filled 2023-02-14: qty 5
  Filled 2023-02-14 (×2): qty 10

## 2023-02-14 NOTE — Assessment & Plan Note (Signed)
Continue Norvasc and carvedilol

## 2023-02-14 NOTE — Evaluation (Signed)
Clinical/Bedside Swallow Evaluation Patient Details  Name: Gina Kelley MRN: [id number] Date of Birth: 12-11-1935  Today's Date: 02/14/2023 Time: SLP Start Time (ACUTE ONLY): 1429 SLP Stop Time (ACUTE ONLY): 1509 SLP Time Calculation (min) (ACUTE ONLY): 40 min  Past Medical History:  Past Medical History:  Diagnosis Date   Anemia of chronic disease 07/08/2006   Antral ulcer 02/23/2007   Seen on EGD in 2008, small ulcer with erosion    Cerebral vascular accident (HCC) 04/14/2006   1998, left lower extremity numbness, no residual deficits    Essential hypertension 04/14/2006   Gastroesophageal reflux disease 02/18/2013   Occasional, symptomatically relieved with peptobismol    Healthcare maintenance 12/04/2011   Hyperlipidemia LDL goal < 100 04/14/2006   Hypertensive retinopathy of both eyes, grade 1 05/15/2016   Osteopenia of right femoral neck 10/20/2016   DEXA (10/16/2016): R femur T -2.5 (FRAX tool calculates at -2.4), L1-L4 spine T -0.9, 10 year risk for: Major osteoporotic fracture 8.3%, Hip fracture 2.7%   Type 2 diabetes mellitus with stage 1 chronic kidney disease, without long-term current use of insulin (HCC)    Type II diabetes mellitus (HCC) 04/14/2006   Past Surgical History:  Past Surgical History:  Procedure Laterality Date   COLONOSCOPY     CRANIOTOMY Right 07/08/2022   Procedure: RIGHT CRANIOTOMY HEMATOMA EVACUATION SUBDURAL;  Surgeon: Julio Sicks, MD;  Location: [facility] OR;  Service: Neurosurgery;  Laterality: Right;   ESOPHAGOGASTRODUODENOSCOPY     ESOPHAGOGASTRODUODENOSCOPY (EGD) WITH PROPOFOL N/A 08/01/2022   Procedure: ESOPHAGOGASTRODUODENOSCOPY (EGD) WITH PROPOFOL;  Surgeon: Diamantina Monks, MD;  Location: [facility];  Service: General;  Laterality: N/A;   PEG PLACEMENT N/A 08/01/2022   Procedure: PERCUTANEOUS ENDOSCOPIC GASTROSTOMY (PEG) PLACEMENT;  Surgeon: Diamantina Monks, MD;  Location: [facility];  Service: General;  Laterality: N/A;   HPI:  Pt is an  87 yo female presenting to ED 8/16 from [name] with AMS, fever of 101, decreased PO intake. Per H&P note, pt has pureed foods during the day and tube feeds at night and has an indwelling Foley and PEG. CXR 8/16 with multifocal infiltrative opacities bilaterally, possible infection or PNA. Pt seen acutely and in [facility] CIR in February 2024 and was recommended to maintain NPO status with AMON at d/c. MBS 08/20/22 with sensed aspiration of thin liquids. SLP was targeting therapeutic trials of purees and honey thick liquids via spoon and recommended consideration of oral diet with close monitoring from SLP at next venue of care. PMH includes anemia, CVA with residual L sided deficits and dysarthria, HTN, GERD, HLD, T2DM    Assessment / Plan / Recommendation  Clinical Impression  Per pt's daughter, pt has been on diet consisting of Dys 1 textures with thin liquids at [name] with nightly tube feeds. Oral motor exam significant for severely reduced R sided facial and lingual strength, symmetry, and ROM. SLP provided thorough oral care, removing dried skin and secretions from pt's lingual surface and lips. SLP provided trials of thin liquids, honey thick liquids, and purees all with significant signs concerning for dysphagia and aspiration. Due to pt's reduced R sided facial strength, she is unable to achieve a lingual seal which results in intermittent anterior spillage. She demonstrates oral holding with all trials, requiring cueing to initiate swallow. Once initiated, pt appears to move her head posteriorly which may be contributing to signs of aspiration. Pt with immediate coughing after each bolus administered. After completion of all PO trials and education, pt had significant bout  of coughing with SpO2 reaching 88%. [place] readjusted and pt advised to take breaths through her nose, which resolved SpO2, remaining stable ~98%. RN notified. Given pt's history of dysphagia, recommend she remain NPO with consideration of resuming  alernate means of nutrition. Plan to f/u to assess pt's readiness to complete a repeat MBS as able.  SLP Visit Diagnosis: Dysphagia, unspecified (R13.10)    Aspiration Risk  Moderate aspiration risk    Diet Recommendation NPO    Medication Administration: Via alternative means    Other  Recommendations Oral Care Recommendations: Oral care QID;Staff/trained caregiver to provide oral care    Recommendations for follow up therapy are one component of a multi-disciplinary discharge planning process, led by the attending physician.  Recommendations may be updated based on patient status, additional functional criteria and insurance authorization.  Follow up Recommendations Skilled nursing-short term rehab (<3 hours/day)      Assistance Recommended at Discharge    Functional Status Assessment Patient has had a recent decline in their functional status and demonstrates the ability to make significant improvements in function in a reasonable and predictable amount of time.  Frequency and Duration min 2x/week  2 weeks       Prognosis Prognosis for improved oropharyngeal function: Good Barriers to Reach Goals: Cognitive deficits;Language deficits;Time post onset      Swallow Study   General HPI: Pt is an 87 yo female presenting to ED 8/16 from [name] with AMS, fever of 101, decreased PO intake. Per H&P note, pt has pureed foods during the day and tube feeds at night and has an indwelling Foley and PEG. CXR 8/16 with multifocal infiltrative opacities bilaterally, possible infection or PNA. Pt seen acutely and in [facility] in February 2024 and was recommended to maintain NPO status with AMON at d/c. MBS 08/20/22 with sensed aspiration of thin liquids. SLP was targeting therapeutic trials of purees and honey thick liquids via spoon and recommended consideration of oral diet with close monitoring from SLP at next venue of care. PMH includes anemia, CVA with residual L sided deficits and dysarthria, HTN,  GERD, HLD, T2DM Type of Study: Bedside Swallow Evaluation Previous Swallow Assessment: see HPI Diet Prior to this Study: NPO Temperature Spikes Noted: No Respiratory Status: Nasal cannula History of Recent Intubation: No Behavior/Cognition: Alert;Cooperative;Requires cueing Oral Cavity Assessment: Within Functional Limits Oral Care Completed by SLP: Yes Oral Cavity - Dentition: Edentulous Vision: Impaired for self-feeding Self-Feeding Abilities: Needs assist Patient Positioning: Upright in bed Baseline Vocal Quality: Normal Volitional Cough: Congested Volitional Swallow: Able to elicit    Oral/Motor/Sensory Function Overall Oral Motor/Sensory Function: Severe impairment Facial ROM: Reduced right Facial Symmetry: Abnormal symmetry right Facial Strength: Reduced right Lingual ROM: Reduced right Lingual Symmetry: Abnormal symmetry right Lingual Strength: Reduced   Ice Chips Ice chips: Not tested   Thin Liquid Thin Liquid: Impaired Presentation: Straw Oral Phase Impairments: Reduced labial seal Oral Phase Functional Implications: Oral holding Pharyngeal  Phase Impairments: Cough - Immediate    Nectar Thick Nectar Thick Liquid: Not tested   Honey Thick Honey Thick Liquid: Impaired Presentation: Spoon Oral Phase Impairments: Reduced labial seal Oral Phase Functional Implications: Oral holding Pharyngeal Phase Impairments: Cough - Immediate   Puree Puree: Impaired Presentation: Spoon Oral Phase Impairments: Reduced labial seal Oral Phase Functional Implications: Oral holding Pharyngeal Phase Impairments: Cough - Immediate   Solid     Solid: Not tested     Gwynneth Aliment, M.A., CF-SLP Speech Language Pathology, Acute Rehabilitation Services  Secure Chat  preferred [phone]  02/14/2023,3:25 PM

## 2023-02-14 NOTE — Assessment & Plan Note (Addendum)
With continued deficits and dysarthria Patient has indwelling Foley catheter Continue Keppra, Prozac and gabapentin Patient has G-tube to help maintain nutrition

## 2023-02-14 NOTE — Hospital Course (Signed)
Gina Kelley is a 87 y.o. female with a known history of anemia, CVA with residual L sided deficits and dysarthria, hypertension, GERD, hyperlipidemia, type 2 diabetes presents to the emergency department for evaluation of altered mental status and hypotension.  Patient was in a usual state of health until today when she was noted at her facility to have altered mental status, temp to 101 as well as decreased p.o. intake and decreased urine and stool output overnight.  It was reported that she was hypotensive and hypoglycemic at her facility.  She has also had cough for the last 2 days with negative chest x-ray    Of note patient has indwelling Foley and G-tube.  Usually eats a soft diet during the day and tube feeds at night

## 2023-02-14 NOTE — ED Notes (Addendum)
ED TO INPATIENT HANDOFF REPORT  ED Nurse Name and Phone #: [phone]  S Name/Age/Gender Gina Kelley 87 y.o. female Room/Bed: 005C/005C  Code Status   Code Status: Full Code  Home/[name]/Other Skilled nursing facility Patient oriented to: self Is this baseline? No  Triage Complete: Triage complete  Chief Complaint Sepsis due to pneumonia (HCC) [J18.9, A41.9]  Triage Note Pt present to ED from [facility] with noted hypotension, decreased urine and bowel output. Per the facility, pt noted with AMS two hours prior EMS arrival. Pt alert, verbally limited at this time.  EMS VS: 72/50, 96%b 2L O2, LR   Allergies Allergies  Allergen Reactions   Cozaar [Losartan] Swelling    Angioedema    Periogard [Chlorhexidine Gluconate] Swelling and Other (See Comments)    Skin irritation Eye/tongue/lip swelling   Pork Allergy Other (See Comments)    Unknown reaction   Vasotec [Enalapril] Cough    Level of Care/Admitting Diagnosis ED Disposition     ED Disposition  Admit   Condition  --   Comment  Hospital Area: [facility] [[id number]]  Level of Care: Telemetry Medical [[id number]]  May admit patient to [facility] or [facility] if equivalent level of care is available:: No  Covid Evaluation: Confirmed COVID Negative  Diagnosis: Sepsis due to pneumonia [facility]) [[id number]]  Admitting Physician: [name] [[id number]]  Attending Physician: [name] [[id number]]  Certification:: I certify this patient will need inpatient services for at least 2 midnights  Expected Medical Readiness: 02/15/2023          B Medical/Surgery History Past Medical History:  Diagnosis Date   Anemia of chronic disease 07/08/2006   Antral ulcer 02/23/2007   Seen on EGD in 2008, small ulcer with erosion    Cerebral vascular accident (HCC) 04/14/2006   1998, left lower extremity numbness, no residual deficits    Essential hypertension 04/14/2006   Gastroesophageal reflux  disease 02/18/2013   Occasional, symptomatically relieved with peptobismol    Healthcare maintenance 12/04/2011   Hyperlipidemia LDL goal < 100 04/14/2006   Hypertensive retinopathy of both eyes, grade 1 05/15/2016   Osteopenia of right femoral neck 10/20/2016   DEXA (10/16/2016): R femur T -2.5 (FRAX tool calculates at -2.4), L1-L4 spine T -0.9, 10 year risk for: Major osteoporotic fracture 8.3%, Hip fracture 2.7%   Type 2 diabetes mellitus with stage 1 chronic kidney disease, without long-term current use of insulin (HCC)    Type II diabetes mellitus (HCC) 04/14/2006   Past Surgical History:  Procedure Laterality Date   COLONOSCOPY     CRANIOTOMY Right 07/08/2022   Procedure: RIGHT CRANIOTOMY HEMATOMA EVACUATION SUBDURAL;  Surgeon: Julio Sicks, MD;  Location: [facility] OR;  Service: Neurosurgery;  Laterality: Right;   ESOPHAGOGASTRODUODENOSCOPY     ESOPHAGOGASTRODUODENOSCOPY (EGD) WITH PROPOFOL N/A 08/01/2022   Procedure: ESOPHAGOGASTRODUODENOSCOPY (EGD) WITH PROPOFOL;  Surgeon: Diamantina Monks, MD;  Location: [facility] ENDOSCOPY;  Service: General;  Laterality: N/A;   PEG PLACEMENT N/A 08/01/2022   Procedure: PERCUTANEOUS ENDOSCOPIC GASTROSTOMY (PEG) PLACEMENT;  Surgeon: Diamantina Monks, MD;  Location: [facility] ENDOSCOPY;  Service: General;  Laterality: N/A;     A IV Location/Drains/Wounds Patient Lines/Drains/Airways Status     Active Line/Drains/Airways     Name Placement date Placement time Site Days   Peripheral IV 02/13/23 22 G 1" Left;Posterior Forearm 02/13/23  1619  Forearm  1   Gastrostomy/Enterostomy PEG-jejunostomy 24 Fr. LLQ 08/01/22  1044  LLQ  197  Intake/Output Last 24 hours  Intake/Output Summary (Last 24 hours) at 02/14/2023 1043 Last data filed at 02/13/2023 2113 Gross per 24 hour  Intake 350 ml  Output --  Net 350 ml    Labs/Imaging Results for orders placed or performed during the hospital encounter of 02/13/23 (from the past 48 hour(s))  Blood Culture (routine  x 2)     Status: None (Preliminary result)   Collection Time: 02/13/23  4:27 PM   Specimen: BLOOD RIGHT HAND  Result Value Ref Range   Specimen Description BLOOD RIGHT HAND    Special Requests      BOTTLES DRAWN AEROBIC AND ANAEROBIC Blood Culture results may not be optimal due to an inadequate volume of blood received in culture bottles   Culture      NO GROWTH < 12 HOURS Performed at [facility], [address] [place], [place] [address]    Report Status PENDING   SARS Coronavirus 2 by RT PCR (hospital order, performed in [facility] hospital lab) *cepheid single result test* Anterior Nasal Swab     Status: None   Collection Time: 02/13/23  4:38 PM   Specimen: Anterior Nasal Swab  Result Value Ref Range   SARS Coronavirus 2 by RT PCR NEGATIVE NEGATIVE    Comment: Performed at [facility], [address] [place], [place] [address]  Comprehensive metabolic panel     Status: Abnormal   Collection Time: 02/13/23  5:15 PM  Result Value Ref Range   Sodium 135 135 - 145 mmol/L   Potassium 3.8 3.5 - 5.1 mmol/L   Chloride 98 98 - 111 mmol/L   CO2 27 22 - 32 mmol/L   Glucose, Bld 77 70 - 99 mg/dL    Comment: Glucose reference range applies only to samples taken after fasting for at least 8 hours.   BUN 48 (H) 8 - 23 mg/dL   Creatinine, Ser 4.69 (H) 0.44 - 1.00 mg/dL   Calcium 8.9 8.9 - 62.9 mg/dL   Total Protein 6.6 6.5 - 8.1 g/dL   Albumin 2.6 (L) 3.5 - 5.0 g/dL   AST 42 (H) 15 - 41 U/L   ALT 31 0 - 44 U/L   Alkaline Phosphatase 61 38 - 126 U/L   Total Bilirubin 0.5 0.3 - 1.2 mg/dL   GFR, Estimated 35 (L) >60 mL/min    Comment: (NOTE) Calculated using the CKD-EPI Creatinine Equation (2021)    Anion gap 10 5 - 15    Comment: Performed at [facility], [address] [place], [place] [address]  CBC with Differential     Status: Abnormal   Collection Time: 02/13/23  5:15 PM  Result Value Ref Range   WBC 18.2 (H) 4.0 - 10.5 K/uL   RBC 2.90 (L) 3.87 - 5.11  MIL/uL   Hemoglobin 9.1 (L) 12.0 - 15.0 g/dL   HCT 32.4 (L) 40.1 - 02.7 %   MCV 100.0 80.0 - 100.0 fL   MCH 31.4 26.0 - 34.0 pg   MCHC 31.4 30.0 - 36.0 g/dL   RDW 25.3 (H) 66.4 - 40.3 %   Platelets 198 150 - 400 K/uL   nRBC 0.0 0.0 - 0.2 %   Neutrophils Relative % 82 %   Neutro Abs 15.0 (H) 1.7 - 7.7 K/uL   Lymphocytes Relative 7 %   Lymphs Abs 1.2 0.7 - 4.0 K/uL   Monocytes Relative 9 %   Monocytes Absolute 1.7 (H) 0.1 -  1.0 K/uL   Eosinophils Relative 1 %   Eosinophils Absolute 0.2 0.0 - 0.5 K/uL   Basophils Relative 0 %   Basophils Absolute 0.0 0.0 - 0.1 K/uL   Immature Granulocytes 1 %   Abs Immature Granulocytes 0.15 (H) 0.00 - 0.07 K/uL    Comment: Performed at [facility], [address], [place], [place] [address]  Protime-INR     Status: None   Collection Time: 02/13/23  5:15 PM  Result Value Ref Range   Prothrombin Time 15.0 11.4 - 15.2 seconds   INR 1.2 0.8 - 1.2    Comment: (NOTE) INR goal varies based on device and disease states. Performed at [facility], [address], [place], [place] [address]   APTT     Status: None   Collection Time: 02/13/23  5:15 PM  Result Value Ref Range   aPTT 34 24 - 36 seconds    Comment: Performed at [facility], [address] [place], [place] [address]  Lipase, blood     Status: None   Collection Time: 02/13/23  5:15 PM  Result Value Ref Range   Lipase 49 11 - 51 U/L    Comment: Performed at [facility], [address] [place], [place] [address]  I-Stat Lactic Acid, ED     Status: Abnormal   Collection Time: 02/13/23  5:30 PM  Result Value Ref Range   Lactic Acid, Venous 2.0 (HH) 0.5 - 1.9 mmol/L   Comment NOTIFIED PHYSICIAN   Blood Culture (routine x 2)     Status: None (Preliminary result)   Collection Time: 02/13/23  5:50 PM   Specimen: BLOOD  Result Value Ref Range   Specimen Description BLOOD RIGHT ANTECUBITAL    Special Requests      BOTTLES DRAWN AEROBIC AND ANAEROBIC Blood Culture  results may not be optimal due to an inadequate volume of blood received in culture bottles   Culture      NO GROWTH < 12 HOURS Performed at [facility], [address] [place], [place] [address]    Report Status PENDING   Urinalysis, w/ Reflex to Culture (Infection Suspected) -Urine, Catheterized     Status: Abnormal   Collection Time: 02/13/23  5:50 PM  Result Value Ref Range   Specimen Source URINE, CATHETERIZED    Color, Urine YELLOW YELLOW   APPearance CLOUDY (A) CLEAR   Specific Gravity, Urine 1.018 1.005 - 1.030   pH 5.0 5.0 - 8.0   Glucose, UA NEGATIVE NEGATIVE mg/dL   Hgb urine dipstick NEGATIVE NEGATIVE   Bilirubin Urine NEGATIVE NEGATIVE   Ketones, ur NEGATIVE NEGATIVE mg/dL   Protein, ur 130 (A) NEGATIVE mg/dL   Nitrite NEGATIVE NEGATIVE   Leukocytes,Ua LARGE (A) NEGATIVE   RBC / HPF 11-20 0 - 5 RBC/hpf   WBC, UA >50 0 - 5 WBC/hpf    Comment:        Reflex urine culture not performed if WBC <=10, OR if Squamous epithelial cells >5. If Squamous epithelial cells >5 suggest recollection.    Bacteria, UA RARE (A) NONE SEEN   Squamous Epithelial / HPF 0-5 0 - 5 /HPF   WBC Clumps PRESENT    Mucus PRESENT    Granular Casts, UA PRESENT    Non Squamous Epithelial 0-5 (A) NONE SEEN    Comment: Performed at [facility], [address], [place], [place] [address]  I-Stat Lactic Acid, ED  Status: None   Collection Time: 02/13/23 10:41 PM  Result Value Ref Range   Lactic Acid, Venous 1.4 0.5 - 1.9 mmol/L   CT Head Wo Contrast  Result Date: 02/13/2023 CLINICAL DATA:  Mental status change, unknown cause EXAM: CT HEAD WITHOUT CONTRAST TECHNIQUE: Contiguous axial images were obtained from the base of the skull through the vertex without intravenous contrast. RADIATION DOSE REDUCTION: This exam was performed according to the departmental dose-optimization program which includes automated exposure control, adjustment of the mA and/or kV according to patient size  and/or use of iterative reconstruction technique. COMPARISON:  CT head 01/06/2023. FINDINGS: Brain: Similar right frontal encephalomalacia. Remote left basal ganglia perforator infarct. No evidence of acute large vascular territory infarct, acute hemorrhage, mass lesion, midline shift or hydrocephalus. Vascular: No hyperdense vessel.  Calcific atherosclerosis. Skull: No acute fracture.  Right pterional craniotomy. Sinuses/Orbits: Clear sinuses.  No acute findings. Other: No mastoid effusions. IMPRESSION: 1. No evidence of acute intracranial abnormality. 2. Similar right frontal encephalomalacia and remote left basal ganglia perforator infarct. Electronically Signed   By: Feliberto Harts M.D.   On: 02/13/2023 17:32   DG Chest Port 1 View  Result Date: 02/13/2023 CLINICAL DATA:  Questionable sepsis EXAM: PORTABLE CHEST 1 VIEW COMPARISON:  01/06/2023 FINDINGS: No pneumothorax or effusion. Normal cardiopericardial silhouette. Calcified aorta. No edema. Overlapping cardiac leads. There are some patchy parenchymal infiltrative opacity seen in the right midlung, medial right lung base and left retrocardiac. Degenerative changes seen along the spine. Degenerative changes of the shoulders. IMPRESSION: Multifocal infiltrative opacities identified bilaterally. Recommend follow-up to confirm clearance. Possible infection or pneumonia. Electronically Signed   By: Karen Kays M.D.   On: 02/13/2023 17:14    Pending Labs Unresulted Labs (From admission, onward)     Start     Ordered   02/14/23 1026  CBC  Tomorrow morning,   R        02/14/23 1025   02/14/23 1026  Protime-INR  ONCE - STAT,   STAT        02/14/23 1025   02/14/23 1026  APTT  ONCE - STAT,   STAT        02/14/23 1025   02/14/23 1026  Hemoglobin A1c  (Glycemic Control (SSI)  Q 4 Hours / Glycemic Control (SSI)  AC +/- HS)  Once,   R       Comments: To assess prior glycemic control    02/14/23 1025   02/14/23 1026  Magnesium  Once,   R         02/14/23 1025   02/14/23 1026  Phosphorus  Once,   R        02/14/23 1025   02/14/23 1026  Expectorated Sputum Assessment w Gram Stain, Rflx to Resp Cult  Once,   R       Question:  Patient immune status  Answer:  Immunocompromised   02/14/23 1025   02/14/23 0959  Comprehensive metabolic panel  Once,   R        02/14/23 0959   02/13/23 1750  Urine Culture  Once,   R        02/13/23 1750            Vitals/Pain Today's Vitals   02/14/23 0800 02/14/23 0830 02/14/23 0855 02/14/23 0930  BP: 129/62 (!) 145/58  134/67  Pulse: 83   78  Resp: 20 17  17   Temp:   98.2 F (36.8 C)   TempSrc:  Axillary   SpO2: 100%   100%  Weight:      Height:        Isolation Precautions Airborne and Contact precautions  Medications Medications  lactated ringers infusion ( Intravenous Canceled Entry 02/14/23 0423)  azithromycin (ZITHROMAX) 500 mg in sodium chloride 0.9 % 250 mL IVPB (0 mg Intravenous Stopped 02/13/23 2113)  guaiFENesin (ROBITUSSIN) 100 MG/5ML liquid 200 mg (has no administration in time range)  feeding supplement (GLUCERNA 1.5 CAL) liquid 237 mL (has no administration in time range)  PreserVision AREDS 2 CHEW 1 each (has no administration in time range)  l-methylfolate-B6-B12 (METANX) 3-35-2 MG per tablet 1 tablet (has no administration in time range)  ascorbic acid (VITAMIN C) tablet 500 mg (has no administration in time range)  levETIRAcetam (KEPPRA) 100 MG/ML solution 500 mg (has no administration in time range)  gabapentin (NEURONTIN) 250 MG/5ML solution 100 mg (has no administration in time range)  free water 120 mL (has no administration in time range)  ferrous sulfate 300 (60 Fe) MG/5ML syrup 408 mg (has no administration in time range)  famotidine (PEPCID) tablet 20 mg (has no administration in time range)  FLUoxetine (PROZAC) capsule 20 mg (has no administration in time range)  ezetimibe (ZETIA) tablet 10 mg (has no administration in time range)  carvedilol (COREG)  tablet 6.25 mg (has no administration in time range)  atorvastatin (LIPITOR) tablet 80 mg (has no administration in time range)  amLODipine (NORVASC) tablet 10 mg (has no administration in time range)  sodium chloride flush (NS) 0.9 % injection 3 mL (has no administration in time range)  0.9 %  sodium chloride infusion (has no administration in time range)  acetaminophen (TYLENOL) tablet 650 mg ( Oral See Alternative 02/14/23 0700)    Or  acetaminophen (TYLENOL) suppository 650 mg (650 mg Rectal Given 02/14/23 0700)  HYDROcodone-acetaminophen (NORCO/VICODIN) 5-325 MG per tablet 1-2 tablet (has no administration in time range)  morphine (PF) 2 MG/ML injection 1 mg (has no administration in time range)  traZODone (DESYREL) tablet 25 mg (has no administration in time range)  senna-docusate (Senokot-S) tablet 1 tablet (has no administration in time range)  bisacodyl (DULCOLAX) EC tablet 5 mg (has no administration in time range)  ondansetron (ZOFRAN) tablet 4 mg (has no administration in time range)    Or  ondansetron (ZOFRAN) injection 4 mg (has no administration in time range)  albuterol (PROVENTIL) (2.5 MG/3ML) 0.083% nebulizer solution 2.5 mg (has no administration in time range)  ipratropium (ATROVENT) nebulizer solution 0.5 mg (has no administration in time range)  hydrALAZINE (APRESOLINE) injection 5 mg (has no administration in time range)  cefTRIAXone (ROCEPHIN) 2 g in sodium chloride 0.9 % 100 mL IVPB (has no administration in time range)  insulin aspart (novoLOG) injection 0-16 Units (has no administration in time range)  lactated ringers bolus 1,000 mL (1,000 mLs Intravenous Bolus 02/13/23 1742)    Mobility non-ambulatory     Focused Assessments Cardiac Assessment Handoff:    No results found for: "CKTOTAL", "CKMB", "CKMBINDEX", "TROPONINI" No results found for: "DDIMER" Does the Patient currently have chest pain? No    R Recommendations: See Admitting Provider  Note  Report given to:   Additional Notes:

## 2023-02-14 NOTE — Assessment & Plan Note (Signed)
With continued deficits and dysarthria Patient has indwelling Foley catheter Patient has G-tube to help maintain nutrition

## 2023-02-14 NOTE — Assessment & Plan Note (Signed)
Continue atorvastatin

## 2023-02-14 NOTE — Assessment & Plan Note (Signed)
Hemoglobin near baseline Continue iron supplementation

## 2023-02-14 NOTE — Assessment & Plan Note (Signed)
Secondary to pneumonia On antibiotics IV fluid hydration Avoid nephrotoxic agents Trend serum creatinine

## 2023-02-14 NOTE — Assessment & Plan Note (Signed)
Continue famotidine 

## 2023-02-14 NOTE — Assessment & Plan Note (Addendum)
CBGs Sliding scale insulin coverage On metformin 750 twice daily per G-tube at rehab but this is held during this hospitalization.

## 2023-02-14 NOTE — Assessment & Plan Note (Addendum)
Continue atorvastatin and Zetia. 

## 2023-02-14 NOTE — Assessment & Plan Note (Addendum)
On Rocephin and azithromycin Blood culture negative to date Question aspiration-patient has significant history of CVA with prolonged rehabilitation course.  SLP was largely involved and according to the family the patient does pured foods during the day at her rehab center.  The family is concerned she does not take in much water however it is not clear that she would not aspirate that. Have asked SLP to reevaluate her swallowing today prior to feeding her given her pneumonia

## 2023-02-14 NOTE — Assessment & Plan Note (Addendum)
Currently on tube feeds at [facility] Dietary and nutrition consult for resumption of tube feeds to ensure that we are giving her adequate nutrition.

## 2023-02-14 NOTE — Assessment & Plan Note (Addendum)
Currently on tube feeds at [facility] at 40 cc/h from 9 PM to 9 AM daily Dietary and nutrition consult for resumption of tube feeds to ensure that we are giving her adequate nutrition. 120 mL per tube x 5  daily

## 2023-02-14 NOTE — Progress Notes (Signed)
Initial Nutrition Assessment  DOCUMENTATION CODES:   Not applicable  INTERVENTION:   Glucerna 1.5@40ml /hr continuous   Free water flushes q4 hours   Regimen provides 1440kcal/day, 79g/day protein and 1453ml/day of free water.   MVI daily via tube   Pt at mild refeed risk; recommend monitor potassium, magnesium and phosphorus labs daily until stable  Daily weights   NUTRITION DIAGNOSIS:   Inadequate oral intake related to dysphagia as evidenced by NPO status (pt with chronic G-tube).  GOAL:   Patient will meet greater than or equal to 90% of their needs  MONITOR:   Diet advancement, Labs, Weight trends, TF tolerance, Skin, I & O's  REASON FOR ASSESSMENT:   Consult Enteral/tube feeding initiation and management  ASSESSMENT:   87 year old female with PMH CVA with L-sided deficits and aphasia, SDH s/p craniotomy requiring chronic G-tube (07/2022), indwelling foley, HTN, DM CKD, HLD and GERD who is admitted with PNA, sepsis and AKI.  RD working remotely.  Spoke with Charity fundraiser at [name]. Pt's home tube feed regimen consists of Glucerna 1.5 @40ml /hr, nocturnal, from 2100-0900 along with q4hr free water flushes. Pt does not receive any protein modulars. Pt eats a pureed diet. RN reports pt with fairly good oral intake up until 1-2 days pta. Pt is edentulous. Pt's home tube feed regimen provides 720kcal and 40g/day protein. Pt currently NPO in hospital with SLP evaluation pending. Pt is asking for oatmeal today. Will initiate continuous tube feeds for now to meet 100% of pt's estimated needs. Can resume nocturnal feeds once pt is initiated on a diet and if her oral intake is adequate. Pt is likely at mild refeed risk.   [employer] reports pt's last weight was 120lbs in May. Per chart, appears weight stable pta.    Medications reviewed and include: vitamin C, azithromycin, pepcid, ferrous sulfate, insulin, NaCl @75ml /hr, ceftriaxone  Labs reviewed: K 3.5 wnl, P 2.7 wnl,  Mg 1.6(L) Wbc- 14.1(H), Hgb 10.1(L), Hct 32.3(L)  NUTRITION - FOCUSED PHYSICAL EXAM: Unable to perform at this time   Diet Order:   Diet Order             Diet NPO time specified Except for: Sips with Meds  Diet effective now                  EDUCATION NEEDS:   No education needs have been identified at this time  Skin:  Skin Assessment: Reviewed RN Assessment  Last BM:  pta  Height:   Ht Readings from Last 1 Encounters:  02/13/23 5\' 3"  (1.6 m)    Weight:   Wt Readings from Last 1 Encounters:  02/13/23 54 kg    Ideal Body Weight:  52.2 kg  BMI:  Body mass index is 21.09 kg/m.  Estimated Nutritional Needs:   Kcal:  1400-1600kcal/day  Protein:  70-80g/day  Fluid:  1.3-1.5L/day  Betsey Holiday MS, RD, LDN Please refer to [facility] for RD and/or RD on-call/weekend/after hours pager

## 2023-02-14 NOTE — Progress Notes (Signed)
Progress Note   Patient: Gina Kelley NGE:[id number] DOB: [birth year] DOA: 02/13/2023     1 DOS: the patient was seen and examined on 02/14/2023   Brief hospital course: Gina Kelley is a 87 y.o. female with a known history of anemia, CVA with residual L sided deficits and dysarthria, hypertension, GERD, hyperlipidemia, type 2 diabetes presents to the emergency department for evaluation of altered mental status and hypotension.  Patient was in a usual state of health until today when she was noted at her facility to have altered mental status, temp to 101 as well as decreased p.o. intake and decreased urine and stool output overnight.  It was reported that she was hypotensive and hypoglycemic at her facility.  She has also had cough for the last 2 days with negative chest x-ray    Of note patient has indwelling Foley and G-tube.  Usually eats a soft diet during the day and tube feeds at night  Assessment and Plan: * Multifocal pneumonia On Rocephin and azithromycin Blood culture negative to date Question aspiration-patient has significant history of CVA with prolonged rehabilitation course.  SLP was largely involved and according to the family the patient does pured foods during the day at her rehab center.  The family is concerned she does not take in much water however it is not clear that she would not aspirate that. Have asked SLP to reevaluate her swallowing today prior to feeding her given her pneumonia  Sepsis with acute renal failure without septic shock (HCC) Secondary to pneumonia On antibiotics IV fluid hydration Avoid nephrotoxic agents Trend serum creatinine  Subdural hematoma (HCC) With continued deficits and dysarthria Patient has indwelling Foley catheter Continue Keppra, Prozac and gabapentin Patient has G-tube to help maintain nutrition  Malnutrition of moderate degree Currently on tube feeds at [facility] rehab at 40 cc/h from 9 PM to 9 AM daily Dietary and  nutrition consult for resumption of tube feeds to ensure that we are giving her adequate nutrition. 120 mL per tube x 5  daily  Gastroesophageal reflux disease without esophagitis Continue famotidine  Essential hypertension Continue Norvasc and carvedilol  Anemia in chronic renal disease Hemoglobin near baseline Continue iron supplementation  Hyperlipidemia Continue atorvastatin and Zetia  Type 2 diabetes mellitus with stage 1 chronic kidney disease, without long-term current use of insulin (HCC) CBGs Sliding scale insulin coverage On metformin 750 twice daily per G-tube at rehab but this is held during this hospitalization.    Subjective: Patient reports feeling hungry and would like some oatmeal.  Physical Exam: Vitals:   02/14/23 0800 02/14/23 0830 02/14/23 0855 02/14/23 0930  BP: 129/62 (!) 145/58  134/67  Pulse: 83   78  Resp: 20 17  17   Temp:   98.2 F (36.8 C)   TempSrc:   Axillary   SpO2: 100%   100%  Weight:      Height:       Physical Examination: General appearance - normal appearing weight and chronically ill appearing Mental status -oriented to person and place Chest -rales in the left lower lobe and otherwise clear Heart - normal rate and regular rhythm Abdomen - soft, nontender, nondistended, no masses or organomegaly G-tube in place Extremities - pedal edema trace Skin - normal coloration and turgor, no rashes, no suspicious skin lesions noted  Data Reviewed: Results for orders placed or performed during the hospital encounter of 02/13/23 (from the past 24 hour(s))  Blood Culture (routine x 2)  Status: None (Preliminary result)   Collection Time: 02/13/23  4:27 PM   Specimen: BLOOD RIGHT HAND  Result Value Ref Range   Specimen Description BLOOD RIGHT HAND    Special Requests      BOTTLES DRAWN AEROBIC AND ANAEROBIC Blood Culture results may not be optimal due to an inadequate volume of blood received in culture bottles   Culture      NO  GROWTH < 12 HOURS Performed at [facility], [address] [place], [place] [address]    Report Status PENDING   SARS Coronavirus 2 by RT PCR (hospital order, performed in [facility] hospital lab) *cepheid single result test* Anterior Nasal Swab     Status: None   Collection Time: 02/13/23  4:38 PM   Specimen: Anterior Nasal Swab  Result Value Ref Range   SARS Coronavirus 2 by RT PCR NEGATIVE NEGATIVE  Comprehensive metabolic panel     Status: Abnormal   Collection Time: 02/13/23  5:15 PM  Result Value Ref Range   Sodium 135 135 - 145 mmol/L   Potassium 3.8 3.5 - 5.1 mmol/L   Chloride 98 98 - 111 mmol/L   CO2 27 22 - 32 mmol/L   Glucose, Bld 77 70 - 99 mg/dL   BUN 48 (H) 8 - 23 mg/dL   Creatinine, Ser 1.91 (H) 0.44 - 1.00 mg/dL   Calcium 8.9 8.9 - 47.8 mg/dL   Total Protein 6.6 6.5 - 8.1 g/dL   Albumin 2.6 (L) 3.5 - 5.0 g/dL   AST 42 (H) 15 - 41 U/L   ALT 31 0 - 44 U/L   Alkaline Phosphatase 61 38 - 126 U/L   Total Bilirubin 0.5 0.3 - 1.2 mg/dL   GFR, Estimated 35 (L) >60 mL/min   Anion gap 10 5 - 15  CBC with Differential     Status: Abnormal   Collection Time: 02/13/23  5:15 PM  Result Value Ref Range   WBC 18.2 (H) 4.0 - 10.5 K/uL   RBC 2.90 (L) 3.87 - 5.11 MIL/uL   Hemoglobin 9.1 (L) 12.0 - 15.0 g/dL   HCT 29.5 (L) 62.1 - 30.8 %   MCV 100.0 80.0 - 100.0 fL   MCH 31.4 26.0 - 34.0 pg   MCHC 31.4 30.0 - 36.0 g/dL   RDW 65.7 (H) 84.6 - 96.2 %   Platelets 198 150 - 400 K/uL   nRBC 0.0 0.0 - 0.2 %   Neutrophils Relative % 82 %   Neutro Abs 15.0 (H) 1.7 - 7.7 K/uL   Lymphocytes Relative 7 %   Lymphs Abs 1.2 0.7 - 4.0 K/uL   Monocytes Relative 9 %   Monocytes Absolute 1.7 (H) 0.1 - 1.0 K/uL   Eosinophils Relative 1 %   Eosinophils Absolute 0.2 0.0 - 0.5 K/uL   Basophils Relative 0 %   Basophils Absolute 0.0 0.0 - 0.1 K/uL   Immature Granulocytes 1 %   Abs Immature Granulocytes 0.15 (H) 0.00 - 0.07 K/uL  Protime-INR     Status: None   Collection Time:  02/13/23  5:15 PM  Result Value Ref Range   Prothrombin Time 15.0 11.4 - 15.2 seconds   INR 1.2 0.8 - 1.2  APTT     Status: None   Collection Time: 02/13/23  5:15 PM  Result Value Ref Range   aPTT 34 24 - 36 seconds  Lipase, blood     Status: None   Collection Time: 02/13/23  5:15  PM  Result Value Ref Range   Lipase 49 11 - 51 U/L  I-Stat Lactic Acid, ED     Status: Abnormal   Collection Time: 02/13/23  5:30 PM  Result Value Ref Range   Lactic Acid, Venous 2.0 (HH) 0.5 - 1.9 mmol/L   Comment NOTIFIED PHYSICIAN   Blood Culture (routine x 2)     Status: None (Preliminary result)   Collection Time: 02/13/23  5:50 PM   Specimen: BLOOD  Result Value Ref Range   Specimen Description BLOOD RIGHT ANTECUBITAL    Special Requests      BOTTLES DRAWN AEROBIC AND ANAEROBIC Blood Culture results may not be optimal due to an inadequate volume of blood received in culture bottles   Culture      NO GROWTH < 12 HOURS Performed at [facility], [address], [place], [place] [address]    Report Status PENDING   Urinalysis, w/ Reflex to Culture (Infection Suspected) -Urine, Catheterized     Status: Abnormal   Collection Time: 02/13/23  5:50 PM  Result Value Ref Range   Specimen Source URINE, CATHETERIZED    Color, Urine YELLOW YELLOW   APPearance CLOUDY (A) CLEAR   Specific Gravity, Urine 1.018 1.005 - 1.030   pH 5.0 5.0 - 8.0   Glucose, UA NEGATIVE NEGATIVE mg/dL   Hgb urine dipstick NEGATIVE NEGATIVE   Bilirubin Urine NEGATIVE NEGATIVE   Ketones, ur NEGATIVE NEGATIVE mg/dL   Protein, ur 213 (A) NEGATIVE mg/dL   Nitrite NEGATIVE NEGATIVE   Leukocytes,Ua LARGE (A) NEGATIVE   RBC / HPF 11-20 0 - 5 RBC/hpf   WBC, UA >50 0 - 5 WBC/hpf   Bacteria, UA RARE (A) NONE SEEN   Squamous Epithelial / HPF 0-5 0 - 5 /HPF   WBC Clumps PRESENT    Mucus PRESENT    Granular Casts, UA PRESENT    Non Squamous Epithelial 0-5 (A) NONE SEEN  I-Stat Lactic Acid, ED     Status: None   Collection  Time: 02/13/23 10:41 PM  Result Value Ref Range   Lactic Acid, Venous 1.4 0.5 - 1.9 mmol/L   CT Head Wo Contrast  Result Date: 02/13/2023 CLINICAL DATA:  Mental status change, unknown cause EXAM: CT HEAD WITHOUT CONTRAST TECHNIQUE: Contiguous axial images were obtained from the base of the skull through the vertex without intravenous contrast. RADIATION DOSE REDUCTION: This exam was performed according to the departmental dose-optimization program which includes automated exposure control, adjustment of the mA and/or kV according to patient size and/or use of iterative reconstruction technique. COMPARISON:  CT head 01/06/2023. FINDINGS: Brain: Similar right frontal encephalomalacia. Remote left basal ganglia perforator infarct. No evidence of acute large vascular territory infarct, acute hemorrhage, mass lesion, midline shift or hydrocephalus. Vascular: No hyperdense vessel.  Calcific atherosclerosis. Skull: No acute fracture.  Right pterional craniotomy. Sinuses/Orbits: Clear sinuses.  No acute findings. Other: No mastoid effusions. IMPRESSION: 1. No evidence of acute intracranial abnormality. 2. Similar right frontal encephalomalacia and remote left basal ganglia perforator infarct. Electronically Signed   By: Feliberto Harts M.D.   On: 02/13/2023 17:32   DG Chest Port 1 View  Result Date: 02/13/2023 CLINICAL DATA:  Questionable sepsis EXAM: PORTABLE CHEST 1 VIEW COMPARISON:  01/06/2023 FINDINGS: No pneumothorax or effusion. Normal cardiopericardial silhouette. Calcified aorta. No edema. Overlapping cardiac leads. There are some patchy parenchymal infiltrative opacity seen in the right midlung, medial right lung base and left retrocardiac. Degenerative changes seen along the spine.  Degenerative changes of the shoulders. IMPRESSION: Multifocal infiltrative opacities identified bilaterally. Recommend follow-up to confirm clearance. Possible infection or pneumonia. Electronically Signed   By: Karen Kays  M.D.   On: 02/13/2023 17:14   EEG adult        [facility] [address] [place]. [place] [address] [phone]      Electroencephalogram Procedure Note Ms. Wynn Maudlin Date of Birth:  1935/08/25 Medical Record Number:  [id number] Indications: Diagnostic Date of Procedure  02/04/2023 Medications: gabapentin (Neurontin) Clinical history : 56 patient being evaluated for seizure Technical Description This study was performed using 17 channel digital electroencephalographic recording equipment. International 10-20 electrode placement was used. The record was obtained with the patient awake, drowsy, and asleep.  The record is of poor technical quality for purposes of interpretation.  There are excessive movement artifacts throughout the recording Activation Procedures:  photic stimulation . EEG Description Awake: Alpha Activity: The waking state record contains a well-defined bi-occipital alpha rhythm of  moderate amplitude with a dominant frequency of 7 Hz. Reactivity is uncertain.  Intermittent 6 to 7 Hz theta range slowing is noted throughout the recording. No paroxsymal activity, spikes, or sharp waves are noted. Technical component of study is suboptimal with excessive movement artifacts EKG tracing shows regular sinus rhythm Length of this recording is 25 minutes and 2 seconds Sleep: With drowsiness, there is attenuation of the background alpha activity. As the patient enters into light sleep, vertex waves and symmetrical spindles are noted. K complexes are noted in sleep. Transition to the waking state is unremarkable. Result of Activation Procedures: Hyperventilation: N/A. Photo Stimulation: No photic driving response is noted. Summary This is a mildly abnormal EEG due to the presence of mild bihemispheric slowing which is a nonspecific finding seen in a variety of conditions including old age, dementia, toxic metabolic, hypoxic or ischemic etiologies.  No definite epileptiform activity  was noted.    Family Communication: Daughter [name] by phone  Disposition: Status is: Inpatient Remains inpatient appropriate because: Sepsis, need for IV antibiotics, need for IV fluid hydration and continued workup  Planned Discharge Destination: Skilled nursing facility DVT prophylaxis: SCDs Patient remains full code Time spent: 46 minutes  Author: Reva Bores, MD 02/14/2023 10:00 AM  For on call review [email or web address].

## 2023-02-14 NOTE — Plan of Care (Signed)
  Problem: Fluid Volume: Goal: Hemodynamic stability will improve Outcome: Progressing   Problem: Nutritional: Goal: Maintenance of adequate nutrition will improve Outcome: Progressing   Problem: Education: Goal: Knowledge of General Education information will improve Description: Including pain rating scale, medication(s)/side effects and non-pharmacologic comfort measures Outcome: Progressing   Problem: Coping: Goal: Level of anxiety will decrease Outcome: Progressing   Problem: Safety: Goal: Ability to remain free from injury will improve Outcome: Progressing

## 2023-02-14 NOTE — Assessment & Plan Note (Signed)
On Rocephin and azithromycin Blood culture negative to date Question aspiration-patient has significant history of CVA with prolonged rehabilitation course.  SLP was largely involved and according to the family the patient does pured foods during the day at her rehab center.  The family is concerned she does not take in much water however it is not clear that she would not aspirate that. Have asked SLP to reevaluate her swallowing today prior to feeding her given her pneumonia

## 2023-02-14 NOTE — Assessment & Plan Note (Signed)
CBGs Sliding scale insulin coverage

## 2023-02-15 DIAGNOSIS — E781 Pure hyperglyceridemia: Secondary | ICD-10-CM

## 2023-02-15 DIAGNOSIS — J189 Pneumonia, unspecified organism: Secondary | ICD-10-CM | POA: Diagnosis not present

## 2023-02-15 DIAGNOSIS — Z789 Other specified health status: Secondary | ICD-10-CM

## 2023-02-15 DIAGNOSIS — N181 Chronic kidney disease, stage 1: Secondary | ICD-10-CM | POA: Diagnosis not present

## 2023-02-15 DIAGNOSIS — A419 Sepsis, unspecified organism: Secondary | ICD-10-CM | POA: Diagnosis not present

## 2023-02-15 DIAGNOSIS — Z711 Person with feared health complaint in whom no diagnosis is made: Secondary | ICD-10-CM

## 2023-02-15 DIAGNOSIS — Z515 Encounter for palliative care: Secondary | ICD-10-CM | POA: Diagnosis not present

## 2023-02-15 DIAGNOSIS — D631 Anemia in chronic kidney disease: Secondary | ICD-10-CM

## 2023-02-15 DIAGNOSIS — R531 Weakness: Secondary | ICD-10-CM

## 2023-02-15 DIAGNOSIS — K219 Gastro-esophageal reflux disease without esophagitis: Secondary | ICD-10-CM | POA: Diagnosis not present

## 2023-02-15 LAB — BASIC METABOLIC PANEL
Anion gap: 9 (ref 5–15)
BUN: 20 mg/dL (ref 8–23)
CO2: 29 mmol/L (ref 22–32)
Calcium: 8.4 mg/dL — ABNORMAL LOW (ref 8.9–10.3)
Chloride: 101 mmol/L (ref 98–111)
Creatinine, Ser: 0.75 mg/dL (ref 0.44–1.00)
GFR, Estimated: >60 mL/min (ref >60)
Glucose, Bld: 187 mg/dL — ABNORMAL HIGH (ref 70–99)
Potassium: 3.2 mmol/L — ABNORMAL LOW (ref 3.5–5.1)
Sodium: 139 mmol/L (ref 135–145)

## 2023-02-15 LAB — GLUCOSE, CAPILLARY
Glucose-Capillary: 194 mg/dL — ABNORMAL HIGH (ref 70–99)
Glucose-Capillary: 219 mg/dL — ABNORMAL HIGH (ref 70–99)
Glucose-Capillary: 164 mg/dL — ABNORMAL HIGH (ref 70–99)
Glucose-Capillary: 153 mg/dL — ABNORMAL HIGH (ref 70–99)

## 2023-02-15 LAB — PHOSPHORUS: Phosphorus: 1.3 mg/dL — ABNORMAL LOW (ref 2.5–4.6)

## 2023-02-15 LAB — MAGNESIUM: Magnesium: 1.6 mg/dL — ABNORMAL LOW (ref 1.7–2.4)

## 2023-02-15 MED ORDER — POTASSIUM PHOSPHATES 15 MMOLE/5ML IV SOLN
30.0000 mmol | Freq: Once | INTRAVENOUS | Status: AC
  Administered 2023-02-15: 30 mmol via INTRAVENOUS
  Filled 2023-02-15: qty 10

## 2023-02-15 MED ORDER — MAGNESIUM SULFATE 4 GM/100ML IV SOLN
4.0000 g | Freq: Once | INTRAVENOUS | Status: AC
  Administered 2023-02-15: 4 g via INTRAVENOUS
  Filled 2023-02-15: qty 100

## 2023-02-15 MED ORDER — SODIUM CHLORIDE 0.9 % IV SOLN
3.0000 g | Freq: Three times a day (TID) | INTRAVENOUS | Status: DC
  Administered 2023-02-15 – 2023-02-18 (×10): 3 g via INTRAVENOUS
  Filled 2023-02-15 (×10): qty 8

## 2023-02-15 NOTE — Consult Note (Signed)
Pharmacy Antibiotic Note  Gina Kelley is a 87 y.o. female admitted on 02/13/2023 with aspiration pneumonia.  Pharmacy has been consulted for Unasyn dosing.  Patient presents with AMS, hypotension, fever of 101, decreased po intake, decreased urine and stool output. Patient had a cough 2 days before admission and begun aspirating. Scr <1, WBC 14.1, afebrile, BP stable.   Plan: Unasyn 3 g IV q8h Monitor CBC and SCr F/u on cultures Continue monitoring for clinical improvement   Height: 5\' 3"  (160 cm) Weight: 53.3 kg (117 lb 8.1 oz) IBW/kg (Calculated) : 52.4  Temp (24hrs), Avg:98.6 F (37 C), Min:98.4 F (36.9 C), Max:98.9 F (37.2 C)  Recent Labs  Lab 02/13/23 1715 02/13/23 1730 02/13/23 2241 02/14/23 1050 02/14/23 1102 02/15/23 0404  WBC 18.2*  --   --  14.1*  --   --   CREATININE 1.46*  --   --  0.75  --  0.75  LATICACIDVEN  --  2.0* 1.4  --  1.0  --     Estimated Creatinine Clearance: 42.5 mL/min (by C-G formula based on SCr of 0.75 mg/dL).    Allergies  Allergen Reactions   Cozaar [Losartan] Swelling    Angioedema    Periogard [Chlorhexidine Gluconate] Swelling and Other (See Comments)    Skin irritation Eye/tongue/lip swelling   Pork Allergy Other (See Comments)    Unknown reaction   Vasotec [Enalapril] Cough    Antimicrobials this admission: 8/16 Zithromax >> 8/18 8/16 Rocephin  >> 8/17 8/18 Unasyn >>  Dose adjustments this admission: N/A  Microbiology results: 8/16 BCx: ngtd 8/16 UCx: Gram neg rods   Thank you for allowing pharmacy to be a part of this patient's care.  Merla Riches, PharmD  02/15/2023 9:20 AM

## 2023-02-15 NOTE — Consult Note (Signed)
Consultation Note Date: 02/15/2023   Patient Name: Gina Kelley  DOB: 1935-12-13  MRN: [id number]  Age / Sex: 87 y.o., female  PCP: No primary care provider on file. Referring Physician: Maretta Bees, MD  Reason for Consultation: Establishing goals of care, "aspiration PNA"  HPI/Patient Profile: 87 y.o. female  with past medical history of anemia, CVA with residual L sided deficits and dysarthria, hypertension, GERD, hyperlipidemia, type 2 diabetes presented to ED on 02/13/23 from [facility] with AMS and hypotension. Patient was admitted on 02/13/2023 with sepsis secondary to multifocal pneumonia, possible aspiration pneumonia, malnutrition of moderate degree, AKI, recent SDH/ICH s/p craniotomy with residual left hemiparesis/dysphagia requiring PEG tube.   Of note, patient has had 2 admissions and 1 ED visit in the last 6 months. She was seen extensively by PMT during previous Jan 2024 hospitalization.   Clinical Assessment and Goals of Care: I have reviewed medical records including EPIC notes, labs, and imaging. Received report from primary RN - no acute concerns. RN reports patient is lethargic, minimal interaction.   Went to visit patient at bedside - no family/visitors present. Patient was lying in bed asleep - I did not attempt to wake her to preserve comfort. No signs or non-verbal gestures of pain or discomfort noted. No respiratory distress, increased work of breathing, or secretions noted. Patient is chronically ill appearing. She is on 2L O2 [place]. Albumin noted at 2.6 on 02/14/23.  Called daughter/[name] to discuss diagnosis, prognosis, [name], EOL wishes, disposition, and options.  I re-introduced Palliative Medicine as specialized medical care for people living with serious illness. It focuses on providing relief from the symptoms and stress of a serious illness. The goal is to improve quality  of life for both the patient and the family. She explains "we are very familiar with Palliative Care at [facility]."   Boykin Reaper tells me her siblings, within reasonable driving distance, are in route to the hospital; though some are traveling from out of state. She is hopeful to allow all family to gather in person on their way, then include those not in person on speakerphone for code status discussions and [name].    At this time, she is unsure when all family will be arriving. She will call PMT phone with updates. If meeting cannot be held today, she states family will be open for meeting tomorrow. She "doesn't want to feel rushed" through discussions and pending time, may prefer tomorrow.   Boykin Reaper tells me, "everything is going to be ok. She's going to pull through like she always does." Validated patient and family have been through a lot this year.    Questions and concerns were addressed. The patient/family was encouraged to call with questions and/or concerns. PMT card was provided.   Primary Decision Maker: NEXT OF KIN - all children seem to work together to make decisions for the patient    SUMMARY OF RECOMMENDATIONS   Continue current medical treatment Continue full code for now Attempting to schedule family meeting - will likely be tomorrow 8/19. Some are traveling to [place] today from out of state and the others will be available by phone  PMT will continue to follow and support holistically   Code Status/Advance Care Planning: Full code  Palliative Prophylaxis:  Aspiration, Delirium Protocol, Frequent Pain Assessment, Oral Care, and Turn Reposition  Additional Recommendations (Limitations, Scope, Preferences): Full Scope Treatment  Psycho-social/Spiritual:  Desire for further Chaplaincy support:no Created space and opportunity for patient and family to express  thoughts and feelings regarding patient's current medical situation.  Emotional support and therapeutic listening  provided.  Prognosis:  Unable to determine  Discharge Planning: To Be Determined      Primary Diagnoses: Present on Admission:  Multifocal pneumonia  Sepsis with acute renal failure without septic shock (HCC)  Type 2 diabetes mellitus with stage 1 chronic kidney disease, without long-term current use of insulin (HCC)  Hyperlipidemia  Anemia in chronic renal disease  Essential hypertension  Gastroesophageal reflux disease without esophagitis  Malnutrition of moderate degree  Subdural hematoma (HCC)   I have reviewed the medical record, interviewed the patient and family, and examined the patient. The following aspects are pertinent.  Past Medical History:  Diagnosis Date   Anemia of chronic disease 07/08/2006   Antral ulcer 02/23/2007   Seen on EGD in 2008, small ulcer with erosion    Cerebral vascular accident (HCC) 04/14/2006   1998, left lower extremity numbness, no residual deficits    Essential hypertension 04/14/2006   Gastroesophageal reflux disease 02/18/2013   Occasional, symptomatically relieved with peptobismol    Healthcare maintenance 12/04/2011   Hyperlipidemia LDL goal < 100 04/14/2006   Hypertensive retinopathy of both eyes, grade 1 05/15/2016   Osteopenia of right femoral neck 10/20/2016   DEXA (10/16/2016): R femur T -2.5 (FRAX tool calculates at -2.4), L1-L4 spine T -0.9, 10 year risk for: Major osteoporotic fracture 8.3%, Hip fracture 2.7%   Type 2 diabetes mellitus with stage 1 chronic kidney disease, without long-term current use of insulin (HCC)    Type II diabetes mellitus (HCC) 04/14/2006   Social History   Socioeconomic History   Marital status: Married    Spouse name: Not on file   Number of children: Not on file   Years of education: Not on file   Highest education level: Not on file  Occupational History   Occupation: Retired     Comment: [employer]  Tobacco Use   Smoking status: Former    Current packs/day: 0.00    Types: Cigarettes     Quit date: 06/30/1978    Years since quitting: 44.6   Smokeless tobacco: Never  Vaping Use   Vaping status: Never Used  Substance and Sexual Activity   Alcohol use: No   Drug use: No   Sexual activity: Not Currently  Other Topics Concern   Not on file  Social History Narrative   Current Social History 09/07/2020        Patient lives with husband in one level home with 5 outside steps with handrails on both sides        Patient's method of transportation is personal car (drives herself)      The highest level of education was 9 th grade      The patient currently retired from [employer] (due to [employer]).      Identified important Relationships are, "My family"       Pets : None       Interests / Fun: "Go to [employer], sing, read. Went to gym before [employer]."       Current Stressors: "I don't have any. 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Ducatte, BSN, RN-BC  Social Determinants of Health   Financial Resource Strain: Not on file  Food Insecurity: Not on file  Transportation Needs: Not on file  Physical Activity: Not on file  Stress: Not on file  Social Connections: Not on file   Family History  Problem Relation Age of Onset   Stroke Mother    Diabetes Mother    Hypertension Mother    Anuerysm Father 60       Cerebral   Asthma Sister    Breast cancer Sister    Pulmonary disease Brother        Black lung   Stroke Sister    Cirrhosis Brother    Hypertension Son    Asthma Brother    Scheduled Meds:  amLODipine  10 mg Per Tube Daily   ascorbic acid  500 mg Per Tube Daily   atorvastatin  80 mg Per Tube Daily   carvedilol  6.25 mg Per Tube BID WC   ezetimibe  10 mg Per Tube QHS   famotidine  20 mg Per Tube BID   feeding supplement (GLUCERNA 1.5 CAL)   1,000 mL Per Tube Q24H   ferrous sulfate  408 mg Per Tube Daily   FLUoxetine  20 mg Per Tube QHS   free water  120 mL Per Tube Q4H   gabapentin  100 mg Per Tube Q8H   guaiFENesin  200 mg Per Tube QID   insulin aspart  0-16 Units Subcutaneous TID WC & HS   l-methylfolate-B6-B12  1 tablet Per Tube Daily   levETIRAcetam  500 mg Per Tube BID   multivitamin with minerals  1 tablet Per Tube Daily   sodium chloride flush  3 mL Intravenous Q12H   Continuous Infusions:  ampicillin-sulbactam (UNASYN) IV 3 g (02/15/23 0945)   potassium PHOSPHATE IVPB (in mmol) 30 mmol (02/15/23 0755)   PRN Meds:.acetaminophen **OR** acetaminophen, albuterol, bisacodyl, hydrALAZINE, HYDROcodone-acetaminophen, ipratropium, morphine injection, ondansetron **OR** ondansetron (ZOFRAN) IV, senna-docusate, traZODone Medications Prior to Admission:  Prior to Admission medications   Medication Sig Start Date End Date Taking? Authorizing Provider  acetaminophen (TYLENOL) 500 MG tablet Place 500-1,000 mg into feeding tube See admin instructions. 1000 mg via G-tube twice daily and 500 mg four times daily as needed for pain   Yes [provider]  amLODipine (NORVASC) 10 MG tablet Place 1 tablet (10 mg total) into feeding tube daily. 08/23/22  Yes Setzer, Lynnell Jude, PA-C  ascorbic acid (VITAMIN C) 500 MG tablet Place 1 tablet (500 mg total) into feeding tube daily. 08/23/22  Yes Setzer, Lynnell Jude, PA-C  atorvastatin (LIPITOR) 80 MG tablet Place 1 tablet (80 mg total) into feeding tube daily. 08/23/22  Yes Setzer, Lynnell Jude, PA-C  carvedilol (COREG) 6.25 MG tablet Place 1 tablet (6.25 mg total) into feeding tube 2 (two) times daily with a meal. 08/22/22  Yes Setzer, Lynnell Jude, PA-C  diclofenac Sodium (VOLTAREN) 1 % GEL Apply 2 g topically 4 (four) times daily. 08/22/22  Yes Setzer, Lynnell Jude, PA-C  ezetimibe (ZETIA) 10 MG tablet Place 1 tablet (10 mg total) into feeding tube daily. Patient taking differently: Place 10 mg into  feeding tube at bedtime. 08/23/22  Yes Setzer, Lynnell Jude, PA-C  famotidine (PEPCID) 20 MG tablet Place 1 tablet (20 mg total) into feeding tube 2 (two) times daily. 08/22/22  Yes Setzer, Lynnell Jude, PA-C  ferrous sulfate 300 (60 Fe) MG/5ML syrup Place 5 mLs (300 mg total) into feeding tube daily with breakfast. Patient taking differently:  Place 408 mg into feeding tube daily. 08/23/22  Yes Setzer, Lynnell Jude, PA-C  FLUoxetine (PROZAC) 20 MG capsule Place 1 capsule (20 mg total) into feeding tube at bedtime. 08/22/22  Yes Setzer, Lynnell Jude, PA-C  gabapentin (NEURONTIN) 250 MG/5ML solution Place 2 mLs (100 mg total) into feeding tube every 8 (eight) hours. 08/22/22  Yes Setzer, Lynnell Jude, PA-C  guaiFENesin (ROBITUSSIN) 100 MG/5ML liquid Place 200 mg into feeding tube 4 (four) times daily.   Yes [provider]  insulin lispro (HUMALOG) 100 UNIT/ML KwikPen Inject 0-15 Units into the skin See admin instructions. Inject 0-15 units 4 times daily per sliding scale: < 69 : implement hypoglycemic protocol 70-150 : 0 units 151-200 : 3 units 201-250 : 6 units 251-300 : 9 units 301-350 : 12 units 351-400 : 16 units > 401 : call MD   Yes [provider]  l-methylfolate-B6-B12 (METANX) 3-35-2 MG TABS tablet Place 1 tablet into feeding tube daily. 08/23/22  Yes Setzer, Lynnell Jude, PA-C  levETIRAcetam (KEPPRA) 100 MG/ML solution Place 5 mLs (500 mg total) into feeding tube 2 (two) times daily. 01/22/23  Yes Micki Riley, MD  lidocaine 4 % Place 1 patch onto the skin daily.   Yes [provider]  metFORMIN (GLUCOPHAGE) 500 MG tablet Place 1 tablet (500 mg total) into feeding tube 2 (two) times daily with a meal. Patient taking differently: Place 750 mg into feeding tube 2 (two) times daily. 08/22/22  Yes Setzer, Lynnell Jude, PA-C  Multiple Vitamins-Minerals (PRESERVISION AREDS 2) CHEW Give 1 each by tube 2 (two) times daily.   Yes [provider]  Nutritional Supplements (FEEDING SUPPLEMENT,  GLUCERNA 1.5 CAL,) LIQD Place 237 mLs into feeding tube 5 (five) times daily. Patient taking differently: Place 40 mL/hr into feeding tube See admin instructions. 40 mL/hr for 12 hours continuous (HOLD feed from 0900-2100) 08/22/22  Yes Setzer, Lynnell Jude, PA-C  ondansetron (ZOFRAN) 4 MG tablet Take 4 mg by mouth every 8 (eight) hours as needed for nausea or vomiting.   Yes [provider]  Water For Irrigation, Sterile (FREE WATER) SOLN Place 120 mLs into feeding tube 5 (five) times daily. Patient taking differently: Place 30 mLs into feeding tube See admin instructions. Flush tube with 30 mL water before and after giving medications and flush tube with 125 mL every 4 hours via pump. 08/22/22  Yes Setzer, Lynnell Jude, PA-C   Allergies  Allergen Reactions   Cozaar [Losartan] Swelling    Angioedema    Periogard [Chlorhexidine Gluconate] Swelling and Other (See Comments)    Skin irritation Eye/tongue/lip swelling   Pork Allergy Other (See Comments)    Unknown reaction   Vasotec [Enalapril] Cough   Review of Systems  Unable to perform ROS: Age    Physical Exam Vitals and nursing note reviewed.  Constitutional:      General: She is not in acute distress.    Appearance: She is ill-appearing.  Pulmonary:     Effort: No respiratory distress.  Skin:    General: Skin is warm and dry.  Neurological:     Comments: asleep     Vital Signs: BP 124/65   Pulse 85   Temp 98.8 F (37.1 C) (Axillary)   Resp (!) 0   Ht 5\' 3"  (1.6 m)   Wt 53.3 kg   SpO2 96%   BMI 20.82 kg/m  Pain Scale: Faces   Pain Score: 0-No pain   SpO2: SpO2: 96 % O2 Device:SpO2:  96 % O2 Flow Rate: .O2 Flow Rate (L/min): 2 L/min  IO: Intake/output summary:  Intake/Output Summary (Last 24 hours) at 02/15/2023 1205 Last data filed at 02/15/2023 0500 Gross per 24 hour  Intake 1964.09 ml  Output 1200 ml  Net 764.09 ml    LBM: Last BM Date : 02/15/23 Baseline Weight: Weight: 54 kg Most recent weight:  Weight: 53.3 kg     Palliative Assessment/Data: PPS 30% with tube feeds     Time In: 1200 Time Out: 1300 Time Total: 60 minutes  Greater than 50%  of this time was spent counseling and coordinating care related to the above assessment and plan.  Signed by: Haskel Khan, NP   Please contact Palliative Medicine Team phone at [phone] for questions and concerns.  For individual provider: See Amion  *Portions of this note are a verbal dictation therefore any spelling and/or grammatical errors are due to the "[employer]agon [employer] One" system interpretation.

## 2023-02-15 NOTE — Plan of Care (Signed)
  Problem: Fluid Volume: Goal: Hemodynamic stability will improve Outcome: Progressing   Problem: Clinical Measurements: Goal: Diagnostic test results will improve Outcome: Progressing   Problem: Respiratory: Goal: Ability to maintain adequate ventilation will improve Outcome: Progressing   Problem: Coping: Goal: Ability to adjust to condition or change in health will improve Outcome: Progressing

## 2023-02-15 NOTE — Progress Notes (Signed)
PROGRESS NOTE        PATIENT DETAILS Name: Gina Kelley Age: 87 y.o. Sex: female Date of Birth: 01-23-1936 Admit Date: 02/13/2023 Admitting Physician [name], DO PCP:No primary care provider on file.  Brief Summary: Patient is a 87 y.o.  female with history of ICH/SDH s/p craniotomy earlier this year-with resultant left-sided hemiparesis/dysphagia requiring PEG tube-resident of [name]-presenting with sepsis/AKI in the setting of presumed aspiration pneumonia.  Significant events: 8/16>> admit to [facility]  Significant studies: 8/16>> CXR: Multifocal infiltrates 8/16>> CT head: No acute abnormality  Significant microbiology data: 8/16>> COVID PCR: Negative 8/16>> urine culture: Gram-negative rods 8/16>> blood culture: No growth  Procedures: None  Consults: None  Subjective: Sleeping-does arouse-opens eyes-mumbles.  Appears to have pooling up some oral secretions.  Objective: Vitals: Blood pressure 124/65, pulse 85, temperature 98.8 F (37.1 C), temperature source Axillary, resp. rate (!) 0, height 5\' 3"  (1.6 m), weight 53.3 kg, SpO2 96%.   Exam: Gen Exam:not in any distress HEENT:atraumatic, normocephalic Chest: B/L clear to auscultation anteriorly-transmitted upper airway sounds. CVS:S1S2 regular Abdomen:soft non tender, non distended Extremities:no edema Neurology: Left-sided hemiparesis at baseline Skin: no rash  Pertinent Labs/Radiology:    Latest Ref Rng & Units 02/14/2023   10:50 AM 02/13/2023    5:15 PM 01/06/2023    7:22 PM  CBC  WBC 4.0 - 10.5 K/uL 14.1  18.2  7.5   Hemoglobin 12.0 - 15.0 g/dL 16.1  9.1  09.6   Hematocrit 36.0 - 46.0 % 32.3  29.0  31.7   Platelets 150 - 400 K/uL 196  198  265     Lab Results  Component Value Date   NA 139 02/15/2023   K 3.2 (L) 02/15/2023   CL 101 02/15/2023   CO2 29 02/15/2023      Assessment/Plan: Sepsis secondary to aspiration pneumonia (present on admission) Sepsis  physiology improving Cultures as above Switch to Unasyn N.p.o. given that she is pooling secretions Pulmonary toileting/frequent suctioning Palliative care evaluation  AKI Hemodynamically mediated Resolved  Hypokalemia/hypophosphatemia/hypomagnesemia Replete/recheck  Recent SDH/ICH-s/p craniotomy with residual left hemiparesis/dysphagia requiring PEG tube PT/OT eval SLP eval Back to [name] when stable Will always remain at aspiration risk given her neuromuscular weakness.  Seizure disorder Keppra  Asymptomatic bacteriuria versus UTI Unclear whether she has any symptoms given her current mental status She has been covered with antibiotics in any event Follow urine cultures  HTN BP stable Norvasc/Coreg  HLD Zetia/Lipitor  DM-2 (A1c 5.8 on 8/17) CBG stable-SSI  Recent Labs    02/14/23 1600 02/14/23 2136 02/15/23 0812  GLUCAP 160* 132* 194*    Mood disorder Prozac  Palliative care Unfortunate 87 year old with recent history of SDH/ICH-with left-sided residual hemiparesis/dysphagia requiring PEG tube-here with aspiration pneumonia.  She appears somewhat lethargic but awake-remains at risk for ongoing aspiration episodes.  Long discussion with her daughter-Gina-understands poor overall prognosis-understands that patient will remain at risk for ongoing episodes of aspiration.  I have recommended DNR status-daughter needs to talk it over with other family members-and will let us know.  Palliative care consult placed.  Nutrition Status: Nutrition Problem: Inadequate oral intake Etiology: dysphagia Signs/Symptoms: NPO status (pt with chronic G-tube)    BMI: Estimated body mass index is 20.82 kg/m as calculated from the following:   Height as of this encounter: 5\' 3"  (1.6 m).   Weight as of  this encounter: 53.3 kg.   Code status:   Code Status: Full Code   DVT Prophylaxis: SCDs Start: 02/14/23 1026   Family Communication: Daughter-Gina Kelley  [phone] over the phone on 8/18.   Disposition Plan: Status is: Inpatient Remains inpatient appropriate because: Severity of illness   Planned Discharge Destination:Skilled nursing facility   Diet: Diet Order             Diet NPO time specified Except for: Sips with Meds  Diet effective now                     Antimicrobial agents: Anti-infectives (From admission, onward)    Start     Dose/Rate Route Frequency Ordered Stop   02/14/23 1400  azithromycin (ZITHROMAX) tablet 500 mg        500 mg Per Tube Daily 02/14/23 1235 02/18/23 0959   02/14/23 1025  cefTRIAXone (ROCEPHIN) 2 g in sodium chloride 0.9 % 100 mL IVPB        2 g 200 mL/hr over 30 Minutes Intravenous Every 24 hours 02/14/23 1025 02/19/23 1029   02/14/23 1025  azithromycin (ZITHROMAX) 500 mg in sodium chloride 0.9 % 250 mL IVPB  Status:  Discontinued        500 mg 250 mL/hr over 60 Minutes Intravenous Every 24 hours 02/14/23 1025 02/14/23 1040   02/13/23 1730  cefTRIAXone (ROCEPHIN) 2 g in sodium chloride 0.9 % 100 mL IVPB  Status:  Discontinued        2 g 200 mL/hr over 30 Minutes Intravenous Every 24 hours 02/13/23 1725 02/14/23 1040   02/13/23 1730  azithromycin (ZITHROMAX) 500 mg in sodium chloride 0.9 % 250 mL IVPB  Status:  Discontinued        500 mg 250 mL/hr over 60 Minutes Intravenous Every 24 hours 02/13/23 1725 02/14/23 1235        MEDICATIONS: Scheduled Meds:  amLODipine  10 mg Per Tube Daily   ascorbic acid  500 mg Per Tube Daily   atorvastatin  80 mg Per Tube Daily   azithromycin  500 mg Per Tube Daily   carvedilol  6.25 mg Per Tube BID WC   ezetimibe  10 mg Per Tube QHS   famotidine  20 mg Per Tube BID   feeding supplement (GLUCERNA 1.5 CAL)  1,000 mL Per Tube Q24H   ferrous sulfate  408 mg Per Tube Daily   FLUoxetine  20 mg Per Tube QHS   free water  120 mL Per Tube Q4H   gabapentin  100 mg Per Tube Q8H   guaiFENesin  200 mg Per Tube QID   insulin aspart  0-16 Units  Subcutaneous TID WC & HS   l-methylfolate-B6-B12  1 tablet Per Tube Daily   levETIRAcetam  500 mg Per Tube BID   multivitamin with minerals  1 tablet Per Tube Daily   sodium chloride flush  3 mL Intravenous Q12H   Continuous Infusions:  sodium chloride 75 mL/hr at 02/15/23 0124   cefTRIAXone (ROCEPHIN)  IV Stopped (02/14/23 1131)   potassium PHOSPHATE IVPB (in mmol) 30 mmol (02/15/23 0755)   PRN Meds:.acetaminophen **OR** acetaminophen, albuterol, bisacodyl, hydrALAZINE, HYDROcodone-acetaminophen, ipratropium, morphine injection, ondansetron **OR** ondansetron (ZOFRAN) IV, senna-docusate, traZODone   I have personally reviewed following labs and imaging studies  LABORATORY DATA: CBC: Recent Labs  Lab 02/13/23 1715 02/14/23 1050  WBC 18.2* 14.1*  NEUTROABS 15.0*  --   HGB 9.1* 10.1*  HCT 29.0* 32.3*  MCV  100.0 102.2*  PLT 198 196    Basic Metabolic Panel: Recent Labs  Lab 02/13/23 1715 02/14/23 1050 02/15/23 0404  NA 135 137 139  K 3.8 3.5 3.2*  CL 98 99 101  CO2 27 26 29   GLUCOSE 77 76 187*  BUN 48* 26* 20  CREATININE 1.46* 0.75 0.75  CALCIUM 8.9 8.4* 8.4*  MG  --  1.6* 1.6*  PHOS  --  2.7 1.3*    GFR: Estimated Creatinine Clearance: 42.5 mL/min (by C-G formula based on SCr of 0.75 mg/dL).  Liver Function Tests: Recent Labs  Lab 02/13/23 1715 02/14/23 1050  AST 42* 48*  ALT 31 30  ALKPHOS 61 64  BILITOT 0.5 0.6  PROT 6.6 6.7  ALBUMIN 2.6* 2.6*   Recent Labs  Lab 02/13/23 1715  LIPASE 49   No results for input(s): "AMMONIA" in the last 168 hours.  Coagulation Profile: Recent Labs  Lab 02/13/23 1715 02/14/23 1050  INR 1.2 1.0    Cardiac Enzymes: No results for input(s): "CKTOTAL", "CKMB", "CKMBINDEX", "TROPONINI" in the last 168 hours.  BNP (last 3 results) No results for input(s): "PROBNP" in the last 8760 hours.  Lipid Profile: No results for input(s): "CHOL", "HDL", "LDLCALC", "TRIG", "CHOLHDL", "LDLDIRECT" in the last 72  hours.  Thyroid Function Tests: No results for input(s): "TSH", "T4TOTAL", "FREET4", "T3FREE", "THYROIDAB" in the last 72 hours.  Anemia Panel: No results for input(s): "VITAMINB12", "FOLATE", "FERRITIN", "TIBC", "IRON", "RETICCTPCT" in the last 72 hours.  Urine analysis:    Component Value Date/Time   COLORURINE YELLOW 02/13/2023 1750   APPEARANCEUR CLOUDY (A) 02/13/2023 1750   LABSPEC 1.018 02/13/2023 1750   PHURINE 5.0 02/13/2023 1750   GLUCOSEU NEGATIVE 02/13/2023 1750   HGBUR NEGATIVE 02/13/2023 1750   BILIRUBINUR NEGATIVE 02/13/2023 1750   KETONESUR NEGATIVE 02/13/2023 1750   PROTEINUR 100 (A) 02/13/2023 1750   NITRITE NEGATIVE 02/13/2023 1750   LEUKOCYTESUR LARGE (A) 02/13/2023 1750    Sepsis Labs: Lactic Acid, Venous    Component Value Date/Time   LATICACIDVEN 1.0 02/14/2023 1102    MICROBIOLOGY: Recent Results (from the past 240 hour(s))  Blood Culture (routine x 2)     Status: None (Preliminary result)   Collection Time: 02/13/23  4:27 PM   Specimen: BLOOD RIGHT HAND  Result Value Ref Range Status   Specimen Description BLOOD RIGHT HAND  Final   Special Requests   Final    BOTTLES DRAWN AEROBIC AND ANAEROBIC Blood Culture results may not be optimal due to an inadequate volume of blood received in culture bottles   Culture   Final    NO GROWTH 2 DAYS Performed at [facility], [address] [place], [place] [address]    Report Status PENDING  Incomplete  SARS Coronavirus 2 by RT PCR (hospital order, performed in [facility] hospital lab) *cepheid single result test* Anterior Nasal Swab     Status: None   Collection Time: 02/13/23  4:38 PM   Specimen: Anterior Nasal Swab  Result Value Ref Range Status   SARS Coronavirus 2 by RT PCR NEGATIVE NEGATIVE Final    Comment: Performed at [facility], [address], [place], [place] [address]  Blood Culture (routine x 2)     Status: None (Preliminary result)   Collection Time: 02/13/23  5:50 PM    Specimen: BLOOD  Result Value Ref Range Status   Specimen Description BLOOD RIGHT ANTECUBITAL  Final   Special Requests   Final  BOTTLES DRAWN AEROBIC AND ANAEROBIC Blood Culture results may not be optimal due to an inadequate volume of blood received in culture bottles   Culture   Final    NO GROWTH 2 DAYS Performed at [facility], [address], [place], [place] [address]    Report Status PENDING  Incomplete  Urine Culture     Status: Abnormal (Preliminary result)   Collection Time: 02/13/23  5:50 PM   Specimen: Urine, Random  Result Value Ref Range Status   Specimen Description URINE, RANDOM  Final   Special Requests NONE Reflexed from [id number]  Final   Culture (A)  Final    90,000 COLONIES/mL GRAM NEGATIVE RODS CULTURE REINCUBATED FOR BETTER GROWTH IDENTIFICATION AND SUSCEPTIBILITIES TO FOLLOW Performed at [facility], [address] [place], [place] [address]    Report Status PENDING  Incomplete    RADIOLOGY STUDIES/RESULTS: CT Head Wo Contrast  Result Date: 02/13/2023 CLINICAL DATA:  Mental status change, unknown cause EXAM: CT HEAD WITHOUT CONTRAST TECHNIQUE: Contiguous axial images were obtained from the base of the skull through the vertex without intravenous contrast. RADIATION DOSE REDUCTION: This exam was performed according to the departmental dose-optimization program which includes automated exposure control, adjustment of the mA and/or kV according to patient size and/or use of iterative reconstruction technique. COMPARISON:  CT head 01/06/2023. FINDINGS: Brain: Similar right frontal encephalomalacia. Remote left basal ganglia perforator infarct. No evidence of acute large vascular territory infarct, acute hemorrhage, mass lesion, midline shift or hydrocephalus. Vascular: No hyperdense vessel.  Calcific atherosclerosis. Skull: No acute fracture.  Right pterional craniotomy. Sinuses/Orbits: Clear sinuses.  No acute findings. Other: No mastoid effusions.  IMPRESSION: 1. No evidence of acute intracranial abnormality. 2. Similar right frontal encephalomalacia and remote left basal ganglia perforator infarct. Electronically Signed   By: Feliberto Harts M.D.   On: 02/13/2023 17:32   DG Chest Port 1 View  Result Date: 02/13/2023 CLINICAL DATA:  Questionable sepsis EXAM: PORTABLE CHEST 1 VIEW COMPARISON:  01/06/2023 FINDINGS: No pneumothorax or effusion. Normal cardiopericardial silhouette. Calcified aorta. No edema. Overlapping cardiac leads. There are some patchy parenchymal infiltrative opacity seen in the right midlung, medial right lung base and left retrocardiac. Degenerative changes seen along the spine. Degenerative changes of the shoulders. IMPRESSION: Multifocal infiltrative opacities identified bilaterally. Recommend follow-up to confirm clearance. Possible infection or pneumonia. Electronically Signed   By: Karen Kays M.D.   On: 02/13/2023 17:14     LOS: 2 days   Jeoffrey Massed, MD  [facility]    To contact the attending provider between 7A-7P or the covering provider during after hours 7P-7A, please log into the web site [email or web address] and access using universal [place] password for that web site. If you do not have the password, please call the hospital operator.  02/15/2023, 9:11 AM

## 2023-02-16 DIAGNOSIS — E781 Pure hyperglyceridemia: Secondary | ICD-10-CM | POA: Diagnosis not present

## 2023-02-16 DIAGNOSIS — N181 Chronic kidney disease, stage 1: Secondary | ICD-10-CM | POA: Diagnosis not present

## 2023-02-16 DIAGNOSIS — K219 Gastro-esophageal reflux disease without esophagitis: Secondary | ICD-10-CM | POA: Diagnosis not present

## 2023-02-16 DIAGNOSIS — A419 Sepsis, unspecified organism: Secondary | ICD-10-CM | POA: Diagnosis not present

## 2023-02-16 DIAGNOSIS — J189 Pneumonia, unspecified organism: Secondary | ICD-10-CM | POA: Diagnosis not present

## 2023-02-16 DIAGNOSIS — S065XAA Traumatic subdural hemorrhage with loss of consciousness status unknown, initial encounter: Secondary | ICD-10-CM | POA: Diagnosis not present

## 2023-02-16 DIAGNOSIS — Z7189 Other specified counseling: Secondary | ICD-10-CM | POA: Diagnosis not present

## 2023-02-16 LAB — CBC
HCT: 28.3 % — ABNORMAL LOW (ref 36.0–46.0)
Hemoglobin: 9.2 g/dL — ABNORMAL LOW (ref 12.0–15.0)
MCH: 31 pg (ref 26.0–34.0)
MCHC: 32.5 g/dL (ref 30.0–36.0)
MCV: 95.3 fL (ref 80.0–100.0)
Platelets: 216 10*3/uL (ref 150–400)
RBC: 2.97 MIL/uL — ABNORMAL LOW (ref 3.87–5.11)
RDW: 17.2 % — ABNORMAL HIGH (ref 11.5–15.5)
WBC: 8.9 10*3/uL (ref 4.0–10.5)
nRBC: 0 % (ref 0.0–0.2)

## 2023-02-16 LAB — URINE CULTURE: Culture: 90000 — AB

## 2023-02-16 LAB — BASIC METABOLIC PANEL
Anion gap: 8 (ref 5–15)
BUN: 16 mg/dL (ref 8–23)
CO2: 29 mmol/L (ref 22–32)
Calcium: 7.9 mg/dL — ABNORMAL LOW (ref 8.9–10.3)
Chloride: 100 mmol/L (ref 98–111)
Creatinine, Ser: 0.65 mg/dL (ref 0.44–1.00)
GFR, Estimated: >60 mL/min (ref >60)
Glucose, Bld: 173 mg/dL — ABNORMAL HIGH (ref 70–99)
Potassium: 3.2 mmol/L — ABNORMAL LOW (ref 3.5–5.1)
Sodium: 137 mmol/L (ref 135–145)

## 2023-02-16 LAB — GLUCOSE, CAPILLARY
Glucose-Capillary: 173 mg/dL — ABNORMAL HIGH (ref 70–99)
Glucose-Capillary: 175 mg/dL — ABNORMAL HIGH (ref 70–99)
Glucose-Capillary: 166 mg/dL — ABNORMAL HIGH (ref 70–99)
Glucose-Capillary: 183 mg/dL — ABNORMAL HIGH (ref 70–99)

## 2023-02-16 LAB — PHOSPHORUS: Phosphorus: 2.4 mg/dL — ABNORMAL LOW (ref 2.5–4.6)

## 2023-02-16 LAB — MAGNESIUM: Magnesium: 2.3 mg/dL (ref 1.7–2.4)

## 2023-02-16 MED ORDER — POTASSIUM CHLORIDE 20 MEQ PO PACK
40.0000 meq | PACK | Freq: Once | ORAL | Status: AC
  Administered 2023-02-16: 40 meq
  Filled 2023-02-16: qty 2

## 2023-02-16 MED ORDER — POTASSIUM PHOSPHATES 15 MMOLE/5ML IV SOLN
30.0000 mmol | Freq: Once | INTRAVENOUS | Status: AC
  Administered 2023-02-16: 30 mmol via INTRAVENOUS
  Filled 2023-02-16: qty 10

## 2023-02-16 NOTE — Plan of Care (Signed)
  Problem: Fluid Volume: Goal: Hemodynamic stability will improve Outcome: Progressing   Problem: Respiratory: Goal: Ability to maintain adequate ventilation will improve Outcome: Progressing   Problem: Coping: Goal: Ability to adjust to condition or change in health will improve Outcome: Progressing   Problem: Nutritional: Goal: Maintenance of adequate nutrition will improve Outcome: Progressing   Problem: Education: Goal: Knowledge of General Education information will improve Description: Including pain rating scale, medication(s)/side effects and non-pharmacologic comfort measures Outcome: Progressing

## 2023-02-16 NOTE — Progress Notes (Signed)
Daily Progress Note   Patient Name: Gina Kelley       Date: 02/16/2023 DOB: 12/26/1935  Age: 87 y.o. MRN#: [id number] Attending Physician: Maretta Bees, MD Primary Care Physician: No primary care provider on file. Admit Date: 02/13/2023  Reason for Consultation/Follow-up: Establishing goals of care  Subjective: Medical records reviewed including progress notes, labs, imaging. Patient assessed at the bedside.  She is sitting comfortably.  Did not attempt to arouse in order to preserve comfort. No family present during my visit.  I called patient's daughter Gina Kelley to follow-up, provide palliative support, and schedule family meeting for goals of care discussion. She states "we are very familiar with palliative care." She adds that while some of her siblings have arrived from out of town as planned, they had quite an intense conversation with patient's doctor yesterday.  She would prefer additional calls to be placed to patient's daughter Gina Kelley, who is the eldest, instead of her.  She tells me family will not be ready to have these hard conversations today and she confirms her preference for me to try calling [name] tomorrow to speak on family's behalf regarding readiness for a family meeting.    Questions and concerns addressed. PMT will continue to support holistically.   Length of Stay: 3   Physical Exam Vitals and nursing note reviewed.  Constitutional:      General: She is sleeping. She is not in acute distress.    Interventions: Nasal cannula in place.  Cardiovascular:     Rate and Rhythm: Normal rate.  Pulmonary:     Effort: Pulmonary effort is normal.  Neurological:     Motor: Weakness present.             Vital Signs: BP 133/71   Pulse 82   Temp 98.8 F (37.1 C)  (Axillary)   Resp (!) 21   Ht 5\' 3"  (1.6 m)   Wt 53.3 kg   SpO2 98%   BMI 20.82 kg/m  SpO2: SpO2: 98 % O2 Device: O2 Device: Nasal Cannula O2 Flow Rate: O2 Flow Rate (L/min): 2 L/min      Palliative Assessment/Data:   Palliative Care Assessment & Plan   Patient Profile: 87 y.o. female  with past medical history of anemia, CVA with residual L sided deficits and dysarthria, hypertension, GERD,  hyperlipidemia, type 2 diabetes presented to ED on 02/13/23 from [facility] with AMS and hypotension. Patient was admitted on 02/13/2023 with sepsis secondary to multifocal pneumonia, possible aspiration pneumonia, malnutrition of moderate degree, AKI, recent SDH/ICH s/p craniotomy with residual left hemiparesis/dysphagia requiring PEG tube.    Of note, patient has had 2 admissions and 1 ED visit in the last 6 months. She was seen extensively by PMT during previous Jan 2024 hospitalization.   Assessment: Goals of care conversation Acute metabolic encephalopathy Sepsis secondary to multifocal pneumonia, likely aspiration Moderate protein calorie malnutrition Recent SDH/ICH status post craniotomy with residual left hemiparesis/dysphagia/dysarthria and PEG tube in place Bladder outlet obstruction requiring chronic Foley  Recommendations/Plan: Continue full code/full scope treatment Attempted to schedule family meeting, patient's daughter defers at this time.  She is agreeable to PMT reaching out again tomorrow PMT will continue to follow and support   Prognosis: Guarded to poor  Discharge Planning: To Be Determined  Care plan was discussed with patient's daughter   Total time: I spent 35 minutes in the care of the patient today in the above activities and documenting the encounter.          Jarek Longton Jeni Salles, PA-C  Palliative Medicine Team Team phone # [phone]  Thank you for allowing the Palliative Medicine Team to assist in the care of this patient. Please utilize  secure chat with additional questions, if there is no response within 30 minutes please call the above phone number.  Palliative Medicine Team providers are available by phone from 7am to 7pm daily and can be reached through the team cell phone.  Should this patient require assistance outside of these hours, please call the patient's attending physician.

## 2023-02-16 NOTE — Evaluation (Signed)
Physical Therapy Evaluation Patient Details Name: Gina Kelley MRN: [id number] DOB: 1935/11/11 Today's Date: 02/16/2023  History of Present Illness  87 y.o. female admitted 02/13/23 from [facility] with AMS and hypotension. Dx with sepsis secondary to multifocal pneumonia, possible aspiration pneumonia, malnutrition of moderate degree, AKI. Recent SDH/ICH s/p craniotomy (07/2022) with residual left hemiparesis/dysphagia/dysarthria requiring PEG tube. PMH includes anemia, dysarthria, hypertension, GERD, hyperlipidemia, type 2 diabetes.  Clinical Impression  Pt admitted with above diagnosis. Pt from [facility] -long term at baseline. Spoke with staff who report pt could assist with rolling and was a Max stand pivot for transfers.  They report total care for all ADLS including feeding.  Today, pt was max A bed mobility and unable to progress to sitting EOB due to lethargy and constant loose stool.  She was able to say she was having a BM. She demonstrates L UE weakness, decreased sensation, and inattention.  Bil LE appeared equal for task assessed.  Pt is slightly below her baseline and will benefit from therapy while hospitalized with likely return to long term care at [facility].  Pt currently with functional limitations due to the deficits listed below (see PT Problem List). Pt will benefit from acute skilled PT to increase their independence and safety with mobility to allow discharge.           If plan is discharge home, recommend the following: Two people to help with walking and/or transfers;Two people to help with bathing/dressing/bathroom   Can travel by private vehicle   No    Equipment Recommendations None recommended by PT  Recommendations for Other Services       Functional Status Assessment Patient has had a recent decline in their functional status and demonstrates the ability to make significant improvements in function in a reasonable and predictable amount of  time. (slight decline)     Precautions / Restrictions Precautions Precautions: Fall Precaution Comments: PEG Restrictions Weight Bearing Restrictions: No      Mobility  Bed Mobility Overal bed mobility: Needs Assistance Bed Mobility: Rolling Rolling: Max assist         General bed mobility comments: Rolled both directions with assist to flex knee and reach across, max A to roll.  Max x 2 bed mobility    Transfers                   General transfer comment: Unable due to lethargy and constant loose stool (NT reports just cleaned her and pt going throughout evaluation, cleaned and placed on bed pan with tech notified of time on bed pan [id number])    Ambulation/Gait                  Stairs            Wheelchair Mobility     Tilt Bed    Modified Rankin (Stroke Patients Only)       Balance       Sitting balance - Comments: unable today                                     Pertinent Vitals/Pain Pain Assessment Pain Assessment: Faces Faces Pain Scale: Hurts little more Pain Location: arms and legs with ROM and MMT (suspect stiffness) Pain Descriptors / Indicators: Grimacing, Discomfort Pain Intervention(s): Limited activity within patient's tolerance, Monitored during session, Repositioned    Home Living  Family/patient expects to be discharged to:: Skilled nursing facility                   Additional Comments: [name]    Prior Function Prior Level of Function : Needs assist             Mobility Comments: Prior to stroke in February pt was independent and driving.  Since that time [facility] went to [facility] then [name].  She is long-term resident now at [name].  She was max A for stand pivots and was able to assist with rolling.  Provided from prior chart review and staff at [facility] ADLs Comments: Pt was total care adls and feeding.     Extremity/Trunk Assessment   Upper Extremity Assessment Upper  Extremity Assessment: RUE deficits/detail;LUE deficits/detail RUE Deficits / Details: ROM WFL, MMT at least 3/5 LUE Deficits / Details: flaccid/trace from previous CVA    Lower Extremity Assessment Lower Extremity Assessment: LLE deficits/detail;RLE deficits/detail;Difficult to assess due to impaired cognition RLE Deficits / Details: ROM WFL; MMT grossly 2/5 throughout, difficulty following further commands; overall appears equal bil LLE Deficits / Details: ROM WFL; MMT grossly 2/5 throughout, difficulty following further commands; overall appears equal bil    Cervical / Trunk Assessment Cervical / Trunk Assessment: Kyphotic  Communication   Communication Communication: Difficulty communicating thoughts/reduced clarity of speech (expressive aphasia)  Cognition Arousal: Alert Behavior During Therapy: WFL for tasks assessed/performed, Flat affect Overall Cognitive Status: Impaired/Different from baseline Area of Impairment: Orientation, Following commands, Problem solving                 Orientation Level: Disoriented to, Place, Time, Situation     Following Commands: Follows one step commands inconsistently, Follows one step commands with increased time     Problem Solving: Decreased initiation, Requires verbal cues, Requires tactile cues General Comments: Pt pleasant, initially sleepy. Thought she was at [facility] and did not know year or situation.  Did report she was having BM        General Comments General comments (skin integrity, edema, etc.): VSS    Exercises     Assessment/Plan    PT Assessment Patient needs continued PT services  PT Problem List Decreased strength;Decreased range of motion;Decreased cognition;Decreased activity tolerance;Decreased balance;Decreased safety awareness;Decreased mobility;Decreased knowledge of precautions       PT Treatment Interventions DME instruction;Therapeutic exercise;Balance training;Neuromuscular  re-education;Functional mobility training;Therapeutic activities;Patient/family education    PT Goals (Current goals can be found in the Care Plan section)  Acute Rehab PT Goals Patient Stated Goal: unable to stated PT Goal Formulation: Patient unable to participate in goal setting Time For Goal Achievement: 03/02/23 Potential to Achieve Goals: Fair    Frequency Min 1X/week     Co-evaluation PT/OT/SLP Co-Evaluation/Treatment: Yes Reason for Co-Treatment: Complexity of the patient's impairments (multi-system involvement);Necessary to address cognition/behavior during functional activity;For patient/therapist safety;To address functional/ADL transfers PT goals addressed during session: Mobility/safety with mobility;Balance;Strengthening/ROM OT goals addressed during session: ADL's and self-care;Strengthening/ROM       AM-PAC PT "6 Clicks" Mobility  Outcome Measure Help needed turning from your back to your side while in a flat bed without using bedrails?: Total Help needed moving from lying on your back to sitting on the side of a flat bed without using bedrails?: Total Help needed moving to and from a bed to a chair (including a wheelchair)?: Total Help needed standing up from a chair using your arms (e.g., wheelchair or bedside chair)?:  Total Help needed to walk in hospital room?: Total Help needed climbing 3-5 steps with a railing? : Total 6 Click Score: 6    End of Session   Activity Tolerance: Patient limited by lethargy;Other (comment) (limited by loose stools) Patient left: in bed;with call bell/phone within reach;with bed alarm set (on bed pan with tech notified) Nurse Communication: Mobility status PT Visit Diagnosis: Other abnormalities of gait and mobility (R26.89);Muscle weakness (generalized) (M62.81)    Time: 2440-1027 PT Time Calculation (min) (ACUTE ONLY): 25 min   Charges:   PT Evaluation $PT Eval Low Complexity: 1 Low   PT General Charges $$ ACUTE PT  VISIT: 1 Visit         Anise Salvo, PT Acute Rehab [facility] Rehab [phone]   Rayetta Humphrey 02/16/2023, 12:07 PM

## 2023-02-16 NOTE — Evaluation (Signed)
Occupational Therapy Evaluation and Discharge Patient Details Name: Gina Kelley MRN: [id number] DOB: [birth year] Today's Date: 02/16/2023   History of Present Illness 87 y.o. female admitted 02/13/23 from [name] with AMS and hypotension. Dx with sepsis secondary to multifocal pneumonia, possible aspiration pneumonia, malnutrition of moderate degree, AKI. Recent SDH/ICH s/p craniotomy (07/2022) with residual left hemiparesis/dysphagia/dysarthria requiring PEG tube. PMH includes anemia, dysarthria, hypertension, GERD, hyperlipidemia, type 2 diabetes.   Clinical Impression   After PT called Gina Kelley it has been confirmed that this patient is a long term resident. She is total care for ADL and baseline including feeding. She assists with rolling and max A for stand pivot transfers.  Today, pt was max A bed mobility and unable to progress to sitting EOB due to lethargy and constant loose stool. Pt did attempt to engage in oral care with suction kit - but ultimately required max A. She did report feeling much better and her mouth looked much better after oral care. She was able to say she was having a BM. LUE with trace movements - and potentially neglect from previous CVA, Since Pt is at baseline for ADL OT will sign off at this time. Thank you for the opportunity to work with and serve this patient.       If plan is discharge home, recommend the following: A lot of help with bathing/dressing/bathroom    Functional Status Assessment  Patient has not had a recent decline in their functional status (at baseline for ADL functioning)  Equipment Recommendations  None recommended by OT    Recommendations for Other Services Other (comment) (Palliative Medicine)     Precautions / Restrictions Precautions Precautions: Fall Precaution Comments: PEG Restrictions Weight Bearing Restrictions: No      Mobility Bed Mobility Overal bed mobility: Needs Assistance Bed Mobility:  Rolling Rolling: Max assist         General bed mobility comments: Rolled both directions with assist to flex knee and reach across, max A to roll. Max x 2 bed mobility    Transfers                   General transfer comment: NT this session due to lethargy      Balance       Sitting balance - Comments: unable to due lethargy this session                                   ADL either performed or assessed with clinical judgement   ADL Overall ADL's : At baseline (Total Care for ADL and feeding at [name])                                       General ADL Comments: called and spoke with Gina Kelley Staff to confirm     Vision Patient Visual Report: No change from baseline       Perception         Praxis         Pertinent Vitals/Pain Pain Assessment Pain Assessment: Faces Faces Pain Scale: Hurts little more Pain Location: arms and legs with ROM and MMT (suspect stiffness) Pain Descriptors / Indicators: Grimacing, Discomfort Pain Intervention(s): Monitored during session, Repositioned     Extremity/Trunk Assessment Upper Extremity Assessment Upper Extremity Assessment: RUE deficits/detail;LUE deficits/detail  RUE Deficits / Details: ROM WFL, MMT at least 3/5 LUE Deficits / Details: flaccid/trace from previous CVA   Lower Extremity Assessment Lower Extremity Assessment: LLE deficits/detail;RLE deficits/detail;Difficult to assess due to impaired cognition RLE Deficits / Details: ROM WFL; MMT grossly 2/5 throughout, difficulty following further commands; overall appears equal bil LLE Deficits / Details: ROM WFL; MMT grossly 2/5 throughout, difficulty following further commands; overall appears equal bil   Cervical / Trunk Assessment Cervical / Trunk Assessment: Kyphotic   Communication Communication Communication: Difficulty communicating thoughts/reduced clarity of speech (expressive aphasia)   Cognition Arousal:  Alert Behavior During Therapy: WFL for tasks assessed/performed, Flat affect Overall Cognitive Status: Impaired/Different from baseline Area of Impairment: Orientation, Following commands, Problem solving                 Orientation Level: Disoriented to, Place, Time, Situation     Following Commands: Follows one step commands inconsistently, Follows one step commands with increased time     Problem Solving: Decreased initiation, Requires verbal cues, Requires tactile cues General Comments: Pt pleasant, initially sleepy. Thought she was at [name] and did not know year or situation     General Comments  VSS    Exercises     Shoulder Instructions      Home Living Family/patient expects to be discharged to:: Skilled nursing facility                                 Additional Comments: [name]      Prior Functioning/Environment Prior Level of Function : Needs assist             Mobility Comments: Prior to stroke in February pt was independent and driving.  Since that time [facility] went to [facility] then [name].  She is long-term resident now at [name].  She was max A for stand pivots and was able to assist with rolling.  Provided from prior chart review and staff at [name] ADLs Comments: Pt was total care adls and feeding.        OT Problem List:        OT Treatment/Interventions:      OT Goals(Current goals can be found in the care plan section) Acute Rehab OT Goals Patient Stated Goal: none stated OT Goal Formulation: Patient unable to participate in goal setting  OT Frequency:      Co-evaluation PT/OT/SLP Co-Evaluation/Treatment: Yes Reason for Co-Treatment: Complexity of the patient's impairments (multi-system involvement);Necessary to address cognition/behavior during functional activity;For patient/therapist safety;To address functional/ADL transfers PT goals addressed during session: Mobility/safety with  mobility;Balance;Strengthening/ROM OT goals addressed during session: ADL's and self-care;Strengthening/ROM      AM-PAC OT "6 Clicks" Daily Activity     Outcome Measure Help from another person eating meals?: A Lot Help from another person taking care of personal grooming?: Total Help from another person toileting, which includes using toliet, bedpan, or urinal?: Total Help from another person bathing (including washing, rinsing, drying)?: Total Help from another person to put on and taking off regular upper body clothing?: Total Help from another person to put on and taking off regular lower body clothing?: Total 6 Click Score: 7   End of Session Nurse Communication: Mobility status;Precautions;Other (comment) (on bed pan)  Activity Tolerance: Patient limited by lethargy Patient left: in bed;with call bell/phone within reach;with bed alarm set;with SCD's reapplied;Other (comment) (on bed pan)  OT Visit Diagnosis: Muscle  weakness (generalized) (M62.81)                Time: 1610-9604 OT Time Calculation (min): 25 min Charges:  OT General Charges $OT Visit: 1 Visit OT Evaluation $OT Eval Moderate Complexity: 1 Mod  Nyoka Cowden OTR/L Acute Rehabilitation Services Office: [phone]  Evern Bio [facility] 02/16/2023, 1:14 PM

## 2023-02-16 NOTE — TOC Initial Note (Signed)
Transition of Care [facility]) - Initial/Assessment Note    Patient Details  Name: Gina Kelley MRN: [id number] Date of Birth: Aug 21, 1935  [facility] CM/SW Contact:    Erin Sons, LCSW Phone Number: 02/16/2023, 11:40 AM  Clinical Narrative:                   Expected Discharge Plan: Skilled Nursing Facility Barriers to Discharge: Continued Medical Work up   Patient Goals and CMS Choice      CSW confirmed with [name] that pt is LTC at their facility and can return at DC. TOC will follow for eventual DC to [name].         Admission diagnosis:  Sepsis due to pneumonia (HCC) [J18.9, A41.9] Multifocal pneumonia [J18.9] Sepsis with acute renal failure without septic shock, due to unspecified organism, unspecified acute renal failure type (HCC) [A41.9, R65.20, N17.9] Patient Active Problem List   Diagnosis Date Noted   Multifocal pneumonia 02/13/2023   Sepsis with acute renal failure without septic shock (HCC) 02/13/2023   Mild malnutrition (HCC) 08/07/2022   Subdural hematoma (HCC) 08/02/2022   Pressure injury of skin 07/22/2022   Ventilator dependence (HCC) 07/11/2022   Malnutrition of moderate degree 07/10/2022   S/P craniotomy 07/08/2022   ICH (intracerebral hemorrhage) (HCC) 07/08/2022   Need for prophylactic vaccination against Streptococcus pneumoniae (pneumococcus) 11/15/2021   Prolapse of female pelvic organs 05/15/2020   Osteopenia of right femoral neck 10/20/2016   Hypertensive retinopathy of both eyes, grade 1 05/15/2016   Gastroesophageal reflux disease without esophagitis 02/18/2013   Healthcare maintenance 12/04/2011   Anemia in chronic renal disease 07/08/2006   Type 2 diabetes mellitus with stage 1 chronic kidney disease, without long-term current use of insulin (HCC) 04/14/2006   Hyperlipidemia 04/14/2006   Essential hypertension 04/14/2006   PCP:  No primary care provider on file. Pharmacy:   [facility] [address] [place] - [address] [place] [address] Phone: [phone] Fax: [phone]  [facility] [address] [place] [address] Phone: [phone] Fax: [phone]     Social Determinants of Health (SDOH) Social History: SDOH Screenings   Depression (PHQ2-9): Low Risk  (11/15/2021)  Tobacco Use: Medium Risk (02/13/2023)   SDOH Interventions:     Readmission Risk Interventions     No data to display

## 2023-02-16 NOTE — Progress Notes (Addendum)
PROGRESS NOTE        PATIENT DETAILS Name: Gina Kelley Age: 87 y.o. Sex: female Date of Birth: August 22, 1935 Admit Date: 02/13/2023 Admitting Physician [name], DO PCP:No primary care provider on file.  Brief Summary: Patient is a 87 y.o.  female with history of ICH/SDH s/p craniotomy earlier this year-with resultant left-sided hemiparesis/dysphagia requiring PEG tube-resident of [name]-presenting with sepsis/AKI in the setting of presumed aspiration pneumonia.  Significant events: 8/16>> admit to [facility]  Significant studies: 8/16>> CXR: Multifocal infiltrates 8/16>> CT head: No acute abnormality  Significant microbiology data: 8/16>> COVID PCR: Negative 8/16>> urine culture: Gram-negative rods 8/16>> blood culture: Proteus/Enterococcus  Procedures: None  Consults: None  Subjective: Sleeping this morning-awakes-pushes me away-Per family who I talked to yesterday evening-she is apparently much more awake and alert than and interacting with them.  Objective: Vitals: Blood pressure 133/71, pulse 82, temperature 98.8 F (37.1 C), temperature source Axillary, resp. rate (!) 21, height 5\' 3"  (1.6 m), weight 53.3 kg, SpO2 98%.   Exam: Gen Exam:not in any distress HEENT:atraumatic, normocephalic Chest: Overall much better than yesterday but still with transmitted upper airway sounds. CVS:S1S2 regular Abdomen:soft non tender, non distended Extremities:no edema Neurology: Left hemiparesis at baseline Skin: no rash  Pertinent Labs/Radiology:    Latest Ref Rng & Units 02/16/2023    2:05 AM 02/14/2023   10:50 AM 02/13/2023    5:15 PM  CBC  WBC 4.0 - 10.5 K/uL 8.9  14.1  18.2   Hemoglobin 12.0 - 15.0 g/dL 9.2  16.1  9.1   Hematocrit 36.0 - 46.0 % 28.3  32.3  29.0   Platelets 150 - 400 K/uL 216  196  198     Lab Results  Component Value Date   NA 137 02/16/2023   K 3.2 (L) 02/16/2023   CL 100 02/16/2023   CO2 29 02/16/2023       Assessment/Plan: Sepsis secondary to aspiration pneumonia (present on admission) Sepsis physiology resolved N.p.o.-was poolling secretions as of 8/18-today much better-but still with transmitted upper airway sounds on auscultation Already has a PEG tube in place Unasyn per pharmacy Frequent suctioning/pulmonary toileting Long discussion with multiple family members on 8/18-full code-full scope of treatment. Palliative care following  Acute metabolic encephalopathy Lethargic on initial presentation-improving Likely due to sepsis/aspiration pneumonia  AKI Hemodynamically mediated Resolved  Asymptomatic bacteriuria Urine culture likely reflects colonization rather than a true infection Already on Unasyn in any event Per nursing staff-has a chronic indwelling Foley catheter that was placed at [name] for bladder outlet obstruction.  Hypokalemia/hypophosphatemia/hypomagnesemia Continue to replete/recheck.  Recent SDH/ICH-s/p craniotomy with residual left hemiparesis/dysphagia requiring PEG tube PT/OT eval SLP eval Back to [name] when stable Will always remain at aspiration risk given her neuromuscular weakness.  Family aware-elect to continue full scope of treatment/full code.  Seizure disorder Keppra  HTN BP stable Norvasc/Coreg  HLD Zetia/Lipitor  DM-2 (A1c 5.8 on 8/17) CBG stable-SSI  Recent Labs    02/15/23 1644 02/15/23 2133 02/16/23 0739  GLUCAP 164* 153* 173*    Mood disorder Prozac  Bladder outlet obstruction requiring chronic indwelling Foley catheter Per nursing staff-presented to this hospital with a Foley catheter-this was exchanged in the ED.  Palliative care Unfortunate 87 year old with recent history of SDH/ICH-with left-sided residual hemiparesis/dysphagia requiring PEG tube-here with aspiration pneumonia.  Although she is somewhat improved-she remains at significant  risk for future decompensation events as she will always remain at risk for  aspiration episodes.  Long discussion with multiple family members on 8/18-they desire full scope of treatment-full code.  Palliative care continues to follow.  Nutrition Status: Nutrition Problem: Inadequate oral intake Etiology: dysphagia Signs/Symptoms: NPO status (pt with chronic G-tube)    BMI: Estimated body mass index is 20.82 kg/m as calculated from the following:   Height as of this encounter: 5\' 3"  (1.6 m).   Weight as of this encounter: 53.3 kg.   Code status:   Code Status: Full Code   DVT Prophylaxis: SCDs Start: 02/14/23 1026   Family Communication: Daughter-[name] Senaida Ores [phone] over the phone on 8/18.   Disposition Plan: Status is: Inpatient Remains inpatient appropriate because: Severity of illness   Planned Discharge Destination:Skilled nursing facility   Diet: Diet Order             Diet NPO time specified Except for: Sips with Meds  Diet effective now                     Antimicrobial agents: Anti-infectives (From admission, onward)    Start     Dose/Rate Route Frequency Ordered Stop   02/15/23 1015  Ampicillin-Sulbactam (UNASYN) 3 g in sodium chloride 0.9 % 100 mL IVPB        3 g 200 mL/hr over 30 Minutes Intravenous Every 8 hours 02/15/23 0938 02/20/23 1029   02/14/23 1400  azithromycin (ZITHROMAX) tablet 500 mg  Status:  Discontinued        500 mg Per Tube Daily 02/14/23 1235 02/15/23 0914   02/14/23 1025  cefTRIAXone (ROCEPHIN) 2 g in sodium chloride 0.9 % 100 mL IVPB  Status:  Discontinued        2 g 200 mL/hr over 30 Minutes Intravenous Every 24 hours 02/14/23 1025 02/15/23 0914   02/14/23 1025  azithromycin (ZITHROMAX) 500 mg in sodium chloride 0.9 % 250 mL IVPB  Status:  Discontinued        500 mg 250 mL/hr over 60 Minutes Intravenous Every 24 hours 02/14/23 1025 02/14/23 1040   02/13/23 1730  cefTRIAXone (ROCEPHIN) 2 g in sodium chloride 0.9 % 100 mL IVPB  Status:  Discontinued        2 g 200 mL/hr over 30  Minutes Intravenous Every 24 hours 02/13/23 1725 02/14/23 1040   02/13/23 1730  azithromycin (ZITHROMAX) 500 mg in sodium chloride 0.9 % 250 mL IVPB  Status:  Discontinued        500 mg 250 mL/hr over 60 Minutes Intravenous Every 24 hours 02/13/23 1725 02/14/23 1235        MEDICATIONS: Scheduled Meds:  amLODipine  10 mg Per Tube Daily   ascorbic acid  500 mg Per Tube Daily   atorvastatin  80 mg Per Tube Daily   carvedilol  6.25 mg Per Tube BID WC   ezetimibe  10 mg Per Tube QHS   famotidine  20 mg Per Tube BID   feeding supplement (GLUCERNA 1.5 CAL)  1,000 mL Per Tube Q24H   ferrous sulfate  408 mg Per Tube Daily   FLUoxetine  20 mg Per Tube QHS   free water  120 mL Per Tube Q4H   gabapentin  100 mg Per Tube Q8H   guaiFENesin  200 mg Per Tube QID   insulin aspart  0-16 Units Subcutaneous TID WC & HS   l-methylfolate-B6-B12  1 tablet Per Tube Daily  levETIRAcetam  500 mg Per Tube BID   multivitamin with minerals  1 tablet Per Tube Daily   sodium chloride flush  3 mL Intravenous Q12H   Continuous Infusions:  ampicillin-sulbactam (UNASYN) IV 3 g (02/16/23 0839)   potassium PHOSPHATE IVPB (in mmol) 30 mmol (02/16/23 0838)   PRN Meds:.acetaminophen **OR** acetaminophen, albuterol, bisacodyl, hydrALAZINE, HYDROcodone-acetaminophen, ipratropium, morphine injection, ondansetron **OR** ondansetron (ZOFRAN) IV, senna-docusate, traZODone   I have personally reviewed following labs and imaging studies  LABORATORY DATA: CBC: Recent Labs  Lab 02/13/23 1715 02/14/23 1050 02/16/23 0205  WBC 18.2* 14.1* 8.9  NEUTROABS 15.0*  --   --   HGB 9.1* 10.1* 9.2*  HCT 29.0* 32.3* 28.3*  MCV 100.0 102.2* 95.3  PLT 198 196 216    Basic Metabolic Panel: Recent Labs  Lab 02/13/23 1715 02/14/23 1050 02/15/23 0404 02/16/23 0205  NA 135 137 139 137  K 3.8 3.5 3.2* 3.2*  CL 98 99 101 100  CO2 27 26 29 29   GLUCOSE 77 76 187* 173*  BUN 48* 26* 20 16  CREATININE 1.46* 0.75 0.75 0.65   CALCIUM 8.9 8.4* 8.4* 7.9*  MG  --  1.6* 1.6* 2.3  PHOS  --  2.7 1.3* 2.4*    GFR: Estimated Creatinine Clearance: 42.5 mL/min (by C-G formula based on SCr of 0.65 mg/dL).  Liver Function Tests: Recent Labs  Lab 02/13/23 1715 02/14/23 1050  AST 42* 48*  ALT 31 30  ALKPHOS 61 64  BILITOT 0.5 0.6  PROT 6.6 6.7  ALBUMIN 2.6* 2.6*   Recent Labs  Lab 02/13/23 1715  LIPASE 49   No results for input(s): "AMMONIA" in the last 168 hours.  Coagulation Profile: Recent Labs  Lab 02/13/23 1715 02/14/23 1050  INR 1.2 1.0    Cardiac Enzymes: No results for input(s): "CKTOTAL", "CKMB", "CKMBINDEX", "TROPONINI" in the last 168 hours.  BNP (last 3 results) No results for input(s): "PROBNP" in the last 8760 hours.  Lipid Profile: No results for input(s): "CHOL", "HDL", "LDLCALC", "TRIG", "CHOLHDL", "LDLDIRECT" in the last 72 hours.  Thyroid Function Tests: No results for input(s): "TSH", "T4TOTAL", "FREET4", "T3FREE", "THYROIDAB" in the last 72 hours.  Anemia Panel: No results for input(s): "VITAMINB12", "FOLATE", "FERRITIN", "TIBC", "IRON", "RETICCTPCT" in the last 72 hours.  Urine analysis:    Component Value Date/Time   COLORURINE YELLOW 02/13/2023 1750   APPEARANCEUR CLOUDY (A) 02/13/2023 1750   LABSPEC 1.018 02/13/2023 1750   PHURINE 5.0 02/13/2023 1750   GLUCOSEU NEGATIVE 02/13/2023 1750   HGBUR NEGATIVE 02/13/2023 1750   BILIRUBINUR NEGATIVE 02/13/2023 1750   KETONESUR NEGATIVE 02/13/2023 1750   PROTEINUR 100 (A) 02/13/2023 1750   NITRITE NEGATIVE 02/13/2023 1750   LEUKOCYTESUR LARGE (A) 02/13/2023 1750    Sepsis Labs: Lactic Acid, Venous    Component Value Date/Time   LATICACIDVEN 1.0 02/14/2023 1102    MICROBIOLOGY: Recent Results (from the past 240 hour(s))  Blood Culture (routine x 2)     Status: None (Preliminary result)   Collection Time: 02/13/23  4:27 PM   Specimen: BLOOD RIGHT HAND  Result Value Ref Range Status   Specimen Description  BLOOD RIGHT HAND  Final   Special Requests   Final    BOTTLES DRAWN AEROBIC AND ANAEROBIC Blood Culture results may not be optimal due to an inadequate volume of blood received in culture bottles   Culture   Final    NO GROWTH 3 DAYS Performed at [facility], [address], [place], [place] [address]    Report Status PENDING  Incomplete  SARS Coronavirus 2 by RT PCR (hospital order, performed in [facility] hospital lab) *cepheid single result test* Anterior Nasal Swab     Status: None   Collection Time: 02/13/23  4:38 PM   Specimen: Anterior Nasal Swab  Result Value Ref Range Status   SARS Coronavirus 2 by RT PCR NEGATIVE NEGATIVE Final    Comment: Performed at [facility], [address] [place], [place] [address]  Blood Culture (routine x 2)     Status: None (Preliminary result)   Collection Time: 02/13/23  5:50 PM   Specimen: BLOOD  Result Value Ref Range Status   Specimen Description BLOOD RIGHT ANTECUBITAL  Final   Special Requests   Final    BOTTLES DRAWN AEROBIC AND ANAEROBIC Blood Culture results may not be optimal due to an inadequate volume of blood received in culture bottles   Culture   Final    NO GROWTH 3 DAYS Performed at [facility], [address], [place], [place] [address]    Report Status PENDING  Incomplete  Urine Culture     Status: Abnormal   Collection Time: 02/13/23  5:50 PM   Specimen: Urine, Random  Result Value Ref Range Status   Specimen Description URINE, RANDOM  Final   Special Requests   Final    NONE Reflexed from [id number] Performed at [facility], [address] [place], [place] [address]    Culture (A)  Final    90,000 COLONIES/mL PROTEUS MIRABILIS 30,000 COLONIES/mL ENTEROCOCCUS FAECALIS    Report Status 02/16/2023 FINAL  Final   Organism ID, Bacteria PROTEUS MIRABILIS (A)  Final   Organism ID, Bacteria ENTEROCOCCUS FAECALIS (A)  Final      Susceptibility   Enterococcus faecalis - MIC*    AMPICILLIN <=2  SENSITIVE Sensitive     NITROFURANTOIN <=16 SENSITIVE Sensitive     VANCOMYCIN 1 SENSITIVE Sensitive     * 30,000 COLONIES/mL ENTEROCOCCUS FAECALIS   Proteus mirabilis - MIC*    AMPICILLIN <=2 SENSITIVE Sensitive     CEFAZOLIN <=4 SENSITIVE Sensitive     CEFEPIME <=0.12 SENSITIVE Sensitive     CEFTRIAXONE <=0.25 SENSITIVE Sensitive     CIPROFLOXACIN <=0.25 SENSITIVE Sensitive     GENTAMICIN <=1 SENSITIVE Sensitive     IMIPENEM 2 SENSITIVE Sensitive     NITROFURANTOIN 128 RESISTANT Resistant     TRIMETH/SULFA <=20 SENSITIVE Sensitive     AMPICILLIN/SULBACTAM <=2 SENSITIVE Sensitive     PIP/TAZO <=4 SENSITIVE Sensitive     * 90,000 COLONIES/mL PROTEUS MIRABILIS    RADIOLOGY STUDIES/RESULTS: No results found.   LOS: 3 days   Jeoffrey Massed, MD  [facility]    To contact the attending provider between 7A-7P or the covering provider during after hours 7P-7A, please log into the web site [email or web address] and access using universal [place] password for that web site. If you do not have the password, please call the hospital operator.  02/16/2023, 10:09 AM

## 2023-02-16 NOTE — Care Management Important Message (Signed)
Important Message  Patient Details  Name: Gina Kelley MRN: [id number] Date of Birth: [birth year]   Medicare Important Message Given:  Yes     Gina Kelley 02/16/2023, 4:28 PM

## 2023-02-16 NOTE — Telephone Encounter (Signed)
I spoke with the patient's daughter, [name]. I informed her that her mother should stay on anti seizure medication. She verbalized understanding and expressed appreciation for the call.   She informed me that the patient is currently in the hospital being treated with pneumonia. We are wishing the patient a speedy recovery.

## 2023-02-16 NOTE — [place] FL2 (Signed)
[place] MEDICAID FL2 LEVEL OF CARE FORM     IDENTIFICATION  Patient Name: Gina Kelley Birthdate: 03-Oct-1935 Sex: female Admission Date (Current Location): 02/13/2023  [facility] and [place] Number:  Producer, television/film/video and Address:  The . [facility], [address], [place], [place] [address]      Provider Number: [id number]  Attending Physician Name and Address:  Maretta Bees, MD  Relative Name and Phone Number:       Current Level of Care: Hospital Recommended Level of Care: Skilled Nursing Facility Prior Approval Number:    Date Approved/Denied:   PASRR Number:    Discharge Plan: [name]    Current Diagnoses: Patient Active Problem List   Diagnosis Date Noted   Multifocal pneumonia 02/13/2023   Sepsis with acute renal failure without septic shock (HCC) 02/13/2023   Mild malnutrition (HCC) 08/07/2022   Subdural hematoma (HCC) 08/02/2022   Pressure injury of skin 07/22/2022   Ventilator dependence (HCC) 07/11/2022   Malnutrition of moderate degree 07/10/2022   S/P craniotomy 07/08/2022   ICH (intracerebral hemorrhage) (HCC) 07/08/2022   Need for prophylactic vaccination against Streptococcus pneumoniae (pneumococcus) 11/15/2021   Prolapse of female pelvic organs 05/15/2020   Osteopenia of right femoral neck 10/20/2016   Hypertensive retinopathy of both eyes, grade 1 05/15/2016   Gastroesophageal reflux disease without esophagitis 02/18/2013   Healthcare maintenance 12/04/2011   Anemia in chronic renal disease 07/08/2006   Type 2 diabetes mellitus with stage 1 chronic kidney disease, without long-term current use of insulin (HCC) 04/14/2006   Hyperlipidemia 04/14/2006   Essential hypertension 04/14/2006    Orientation RESPIRATION BLADDER Height & Weight     Self  Other (Comment) (2L nasal cannula) Indwelling catheter Weight: 117 lb 8.1 oz (53.3 kg) Height:  5\' 3"  (160 cm)  BEHAVIORAL SYMPTOMS/MOOD NEUROLOGICAL BOWEL NUTRITION  STATUS        Diet (PEG tube; see d/c usmmary)  AMBULATORY STATUS COMMUNICATION OF NEEDS Skin   Extensive Assist Verbally Normal                       Personal Care Assistance Level of Assistance  Bathing, Feeding, Dressing Bathing Assistance: Limited assistance Feeding assistance: Independent Dressing Assistance: Limited assistance     Functional Limitations Info  Sight, Hearing, Speech Sight Info: Adequate Hearing Info: Adequate Speech Info: Adequate    SPECIAL CARE FACTORS FREQUENCY                       Contractures Contractures Info: Not present    Additional Factors Info  Code Status, Allergies Code Status Info: full code Allergies Info: Cozaar (losartan) Pork Allergy, Periogard (chlorhexidine Gluconate), Vasotec (enalapril)           Current Medications (02/16/2023):  This is the current hospital active medication list Current Facility-Administered Medications  Medication Dose Route Frequency Provider Last Rate Last Admin   acetaminophen (TYLENOL) tablet 650 mg  650 mg Oral Q6H PRN Hugelmeyer, Alexis, DO   650 mg at 02/16/23 0843   Or   acetaminophen (TYLENOL) suppository 650 mg  650 mg Rectal Q6H PRN Hugelmeyer, Alexis, DO   650 mg at 02/14/23 0700   albuterol (PROVENTIL) (2.5 MG/3ML) 0.083% nebulizer solution 2.5 mg  2.5 mg Nebulization Q6H PRN Hugelmeyer, Alexis, DO       amLODipine (NORVASC) tablet 10 mg  10 mg Per Tube Daily Hugelmeyer, Alexis, DO   10 mg at 02/16/23 [phone]  Ampicillin-Sulbactam (UNASYN) 3 g in sodium chloride 0.9 % 100 mL IVPB  3 g Intravenous Q8H Ghimire, Werner Lean, MD 200 mL/hr at 02/16/23 0839 3 g at 02/16/23 1610   ascorbic acid (VITAMIN C) tablet 500 mg  500 mg Per Tube Daily Hugelmeyer, Alexis, DO   500 mg at 02/16/23 0843   atorvastatin (LIPITOR) tablet 80 mg  80 mg Per Tube Daily Hugelmeyer, Alexis, DO   80 mg at 02/16/23 0843   bisacodyl (DULCOLAX) EC tablet 5 mg  5 mg Oral Daily PRN Hugelmeyer, Alexis, DO       carvedilol  (COREG) tablet 6.25 mg  6.25 mg Per Tube BID WC Hugelmeyer, Alexis, DO   6.25 mg at 02/16/23 0842   ezetimibe (ZETIA) tablet 10 mg  10 mg Per Tube QHS Hugelmeyer, Alexis, DO   10 mg at 02/15/23 2116   famotidine (PEPCID) tablet 20 mg  20 mg Per Tube BID Hugelmeyer, Alexis, DO   20 mg at 02/16/23 0843   feeding supplement (GLUCERNA 1.5 CAL) liquid 1,000 mL  1,000 mL Per Tube Q24H Reva Bores, MD   1,000 mL at 02/15/23 1632   ferrous sulfate 300 (60 Fe) MG/5ML syrup 408 mg  408 mg Per Tube Daily Hugelmeyer, Alexis, DO   408 mg at 02/16/23 0842   FLUoxetine (PROZAC) capsule 20 mg  20 mg Per Tube QHS Hugelmeyer, Alexis, DO   20 mg at 02/15/23 2116   free water 120 mL  120 mL Per Tube Q4H Reva Bores, MD   120 mL at 02/16/23 1100   gabapentin (NEURONTIN) 250 MG/5ML solution 100 mg  100 mg Per Tube Q8H Hugelmeyer, Alexis, DO   100 mg at 02/16/23 0543   guaiFENesin (ROBITUSSIN) 100 MG/5ML liquid 200 mg  200 mg Per Tube QID Hugelmeyer, Alexis, DO   200 mg at 02/16/23 9604   hydrALAZINE (APRESOLINE) injection 5 mg  5 mg Intravenous Q8H PRN Hugelmeyer, Alexis, DO       HYDROcodone-acetaminophen (NORCO/VICODIN) 5-325 MG per tablet 1-2 tablet  1-2 tablet Oral Q4H PRN Hugelmeyer, Alexis, DO       insulin aspart (novoLOG) injection 0-16 Units  0-16 Units Subcutaneous TID WC & HS Reva Bores, MD   3 Units at 02/16/23 1133   ipratropium (ATROVENT) nebulizer solution 0.5 mg  0.5 mg Nebulization Q6H PRN Hugelmeyer, Alexis, DO   0.5 mg at 02/16/23 0843   l-methylfolate-B6-B12 (METANX) 3-35-2 MG per tablet 1 tablet  1 tablet Per Tube Daily Hugelmeyer, Alexis, DO   1 tablet at 02/16/23 0843   levETIRAcetam (KEPPRA) 100 MG/ML solution 500 mg  500 mg Per Tube BID Hugelmeyer, Alexis, DO   500 mg at 02/16/23 0842   morphine (PF) 2 MG/ML injection 1 mg  1 mg Intravenous Q6H PRN Hugelmeyer, Alexis, DO       multivitamin with minerals tablet 1 tablet  1 tablet Per Tube Daily Reva Bores, MD   1 tablet at 02/16/23  0842   ondansetron (ZOFRAN) tablet 4 mg  4 mg Oral Q6H PRN Hugelmeyer, Alexis, DO       Or   ondansetron (ZOFRAN) injection 4 mg  4 mg Intravenous Q6H PRN Hugelmeyer, Alexis, DO       potassium PHOSPHATE 30 mmol in dextrose 5 % 500 mL infusion  30 mmol Intravenous Once Maretta Bees, MD 85 mL/hr at 02/16/23 0838 30 mmol at 02/16/23 0838   senna-docusate (Senokot-S) tablet 1 tablet  1 tablet Oral  QHS PRN Hugelmeyer, Alexis, DO       sodium chloride flush (NS) 0.9 % injection 3 mL  3 mL Intravenous Q12H Hugelmeyer, Alexis, DO   3 mL at 02/16/23 0844   traZODone (DESYREL) tablet 25 mg  25 mg Oral QHS PRN Hugelmeyer, Alexis, DO   25 mg at 02/15/23 2116     Discharge Medications: Please see discharge summary for a list of discharge medications.  Relevant Imaging Results:  Relevant Lab Results:   Additional Information ZO#[id number]  Erin Sons, LCSW

## 2023-02-17 DIAGNOSIS — Z7189 Other specified counseling: Secondary | ICD-10-CM | POA: Diagnosis not present

## 2023-02-17 DIAGNOSIS — A419 Sepsis, unspecified organism: Secondary | ICD-10-CM | POA: Diagnosis not present

## 2023-02-17 DIAGNOSIS — J189 Pneumonia, unspecified organism: Secondary | ICD-10-CM | POA: Diagnosis not present

## 2023-02-17 DIAGNOSIS — S065XAA Traumatic subdural hemorrhage with loss of consciousness status unknown, initial encounter: Secondary | ICD-10-CM

## 2023-02-17 DIAGNOSIS — K219 Gastro-esophageal reflux disease without esophagitis: Secondary | ICD-10-CM | POA: Diagnosis not present

## 2023-02-17 DIAGNOSIS — E440 Moderate protein-calorie malnutrition: Secondary | ICD-10-CM

## 2023-02-17 DIAGNOSIS — E781 Pure hyperglyceridemia: Secondary | ICD-10-CM | POA: Diagnosis not present

## 2023-02-17 LAB — BASIC METABOLIC PANEL
Anion gap: 13 (ref 5–15)
BUN: 12 mg/dL (ref 8–23)
CO2: 23 mmol/L (ref 22–32)
Calcium: 8.1 mg/dL — ABNORMAL LOW (ref 8.9–10.3)
Chloride: 98 mmol/L (ref 98–111)
Creatinine, Ser: 0.64 mg/dL (ref 0.44–1.00)
GFR, Estimated: >60 mL/min (ref >60)
Glucose, Bld: 202 mg/dL — ABNORMAL HIGH (ref 70–99)
Potassium: 4.5 mmol/L (ref 3.5–5.1)
Sodium: 134 mmol/L — ABNORMAL LOW (ref 135–145)

## 2023-02-17 LAB — GLUCOSE, CAPILLARY
Glucose-Capillary: 250 mg/dL — ABNORMAL HIGH (ref 70–99)
Glucose-Capillary: 213 mg/dL — ABNORMAL HIGH (ref 70–99)
Glucose-Capillary: 197 mg/dL — ABNORMAL HIGH (ref 70–99)
Glucose-Capillary: 168 mg/dL — ABNORMAL HIGH (ref 70–99)

## 2023-02-17 LAB — MAGNESIUM: Magnesium: 2 mg/dL (ref 1.7–2.4)

## 2023-02-17 LAB — PHOSPHORUS: Phosphorus: 2.2 mg/dL — ABNORMAL LOW (ref 2.5–4.6)

## 2023-02-17 MED ORDER — SODIUM PHOSPHATES 45 MMOLE/15ML IV SOLN
30.0000 mmol | Freq: Once | INTRAVENOUS | Status: AC
  Administered 2023-02-17: 30 mmol via INTRAVENOUS
  Filled 2023-02-17: qty 10

## 2023-02-17 MED ORDER — POTASSIUM PHOSPHATES 15 MMOLE/5ML IV SOLN: 30.0000 mmol | Freq: Once | INTRAVENOUS | Status: DC

## 2023-02-17 NOTE — Progress Notes (Signed)
Speech Language Pathology Treatment: Dysphagia  Patient Details Name: Gina Kelley MRN: [id number] DOB: [birth year] Today's Date: 02/17/2023 Time: 6962-9528 SLP Time Calculation (min) (ACUTE ONLY): 21 min  Assessment / Plan / Recommendation Clinical Impression  Patient seen for diagnostic swallowing treatment. Sleeping upon arrival and although easily aroused, did require verbal and tactile cueing to maintain adequate level of arousal for po intake. Patient is severely dysarthric (largely unintelligible) with decreased oral strength bilaterally, right > left. She was able to obtain adequate labial seal with ice chip trials without SLP cueing. She presents however with significantly prolonged oral manipulation time with lengthy lingual pumping (excess of 30 seconds per bolus) and poor ability to initiate a pharyngeal swallow response. Congested cough noted post po intake. Per RN, patient has required nasal suctioning to clear upper airway secretions. Prognosis for ability to protect airway poor at this time. Will continue to f/u however for indication that she is ready for a repeat instrumental study to determine least restrictive diet.    HPI HPI: Pt is an 87 yo female presenting to ED 8/16 from [name] with AMS, fever of 101, decreased PO intake. Per H&P note, pt has pureed foods during the day and tube feeds at night and has an indwelling Foley and PEG. CXR 8/16 with multifocal infiltrative opacities bilaterally, possible infection or PNA. Pt seen acutely and in [facility] CIR in February 2024 and was recommended to maintain NPO status with AMON at d/c. MBS 08/20/22 with sensed aspiration of thin liquids. SLP was targeting therapeutic trials of purees and honey thick liquids via spoon and recommended consideration of oral diet with close monitoring from SLP at next venue of care. PMH includes anemia, CVA with residual L sided deficits and dysarthria, HTN, GERD, HLD, T2DM      SLP Plan  Continue with current  plan of care      Recommendations for follow up therapy are one component of a multi-disciplinary discharge planning process, led by the attending physician.  Recommendations may be updated based on patient status, additional functional criteria and insurance authorization.    Recommendations  Diet recommendations: NPO Medication Administration: Via alternative means                 Oral care QID;Staff/trained caregiver to provide oral care     Dysphagia, oropharyngeal phase (R13.12)     Continue with current plan of care    [facility] MA, CCC-SLP  Zarayah Lanting Meryl  02/17/2023, 10:19 AM

## 2023-02-17 NOTE — Inpatient Diabetes Management (Signed)
Inpatient Diabetes Program Recommendations  AACE/ADA: New Consensus Statement on Inpatient Glycemic Control   Target Ranges:  Prepandial:   less than 140 mg/dL      Peak postprandial:   less than 180 mg/dL (1-2 hours)      Critically ill patients:  140 - 180 mg/dL    Latest Reference Range & Units 02/17/23 08:04 02/17/23 11:25  Glucose-Capillary 70 - 99 mg/dL 696 (H) 295 (H)    Latest Reference Range & Units 02/16/23 07:39 02/16/23 11:32 02/16/23 16:11 02/16/23 21:35  Glucose-Capillary 70 - 99 mg/dL 284 (H) 132 (H) 440 (H) 183 (H)   Review of Glycemic Control  Diabetes history: DM2 Outpatient Diabetes medications: Humalog 0-15 units QID, Metformin 1000 mg BID Current orders for Inpatient glycemic control: Novolog 0-16 units AC&HS; Glucerna 40 ml/hr  Inpatient Diabetes Program Recommendations:    Insulin: Please consider ordering Novolog 3 units Q4H for tube feeding coverage. If tube feeding is stopped or held then Novolog tube feeding coverage should also be stopped or held.  Thanks, Orlando Penner, RN, MSN, CDCES Diabetes Coordinator Inpatient Diabetes Program [phone] (Team Pager from 8am to 5pm)

## 2023-02-17 NOTE — Plan of Care (Signed)
Pt rested quietly throughout the night with no distress noted. Pt oriented to self. Verbalizes but hard to understand at times. PEG tube with tube feeds, tolerating well. Foley intact to BSD yellow urine. HOB up 30 degrees. Med given for sleep with relief noted.    Problem: Fluid Volume: Goal: Hemodynamic stability will improve Outcome: Progressing   Problem: Clinical Measurements: Goal: Diagnostic test results will improve Outcome: Progressing Goal: Signs and symptoms of infection will decrease Outcome: Progressing   Problem: Respiratory: Goal: Ability to maintain adequate ventilation will improve Outcome: Progressing   Problem: Education: Goal: Ability to describe self-care measures that may prevent or decrease complications (Diabetes Survival Skills Education) will improve Outcome: Progressing Goal: Individualized Educational Video(s) Outcome: Progressing   Problem: Coping: Goal: Ability to adjust to condition or change in health will improve Outcome: Progressing   Problem: Fluid Volume: Goal: Ability to maintain a balanced intake and output will improve Outcome: Progressing   Problem: Health Behavior/Discharge Planning: Goal: Ability to identify and utilize available resources and services will improve Outcome: Progressing Goal: Ability to manage health-related needs will improve Outcome: Progressing   Problem: Metabolic: Goal: Ability to maintain appropriate glucose levels will improve Outcome: Progressing   Problem: Nutritional: Goal: Maintenance of adequate nutrition will improve Outcome: Progressing Goal: Progress toward achieving an optimal weight will improve Outcome: Progressing   Problem: Skin Integrity: Goal: Risk for impaired skin integrity will decrease Outcome: Progressing   Problem: Tissue Perfusion: Goal: Adequacy of tissue perfusion will improve Outcome: Progressing   Problem: Education: Goal: Knowledge of General Education information will  improve Description: Including pain rating scale, medication(s)/side effects and non-pharmacologic comfort measures Outcome: Progressing   Problem: Health Behavior/Discharge Planning: Goal: Ability to manage health-related needs will improve Outcome: Progressing   Problem: Clinical Measurements: Goal: Ability to maintain clinical measurements within normal limits will improve Outcome: Progressing Goal: Will remain free from infection Outcome: Progressing Goal: Diagnostic test results will improve Outcome: Progressing Goal: Respiratory complications will improve Outcome: Progressing Goal: Cardiovascular complication will be avoided Outcome: Progressing   Problem: Activity: Goal: Risk for activity intolerance will decrease Outcome: Progressing   Problem: Nutrition: Goal: Adequate nutrition will be maintained Outcome: Progressing   Problem: Coping: Goal: Level of anxiety will decrease Outcome: Progressing   Problem: Elimination: Goal: Will not experience complications related to bowel motility Outcome: Progressing Goal: Will not experience complications related to urinary retention Outcome: Progressing   Problem: Pain Managment: Goal: General experience of comfort will improve Outcome: Progressing   Problem: Safety: Goal: Ability to remain free from injury will improve Outcome: Progressing   Problem: Skin Integrity: Goal: Risk for impaired skin integrity will decrease Outcome: Progressing

## 2023-02-17 NOTE — Progress Notes (Signed)
Daily Progress Note   Patient Name: Gina Kelley       Date: 02/17/2023 DOB: 1935-07-30  Age: 87 y.o. MRN#: [id number] Attending Physician: Maretta Bees, MD Primary Care Physician: No primary care provider on file. Admit Date: 02/13/2023  Reason for Consultation/Follow-up: Establishing goals of care  Subjective: Medical records reviewed including progress notes, labs, imaging. Patient assessed at the bedside.  She is sitting comfortably.  Did not attempt to arouse in order to preserve comfort. No family present during my visit.  I called patient's daughter [name] to provide palliative support and schedule a family meeting to discuss goals of care. She states that she will be available after her appointment this morning and will call her siblings to participate, as she is the only one in town. Family meeting scheduled for 12:30pm.  Returned to the bedside for our scheduled family meeting.  Patient's daughter Janie Morning, and husband [place] are present in person.  Patient's children Wardell Heath, and Fannie Knee called in to participate by phone.  Reintroduced palliative care medical specialty and that improving quality of life and reducing suffering for patient's and family's.  Reviewed patient's acute illness and previous conversations with the care team.  Family has a good understanding of patient's overall prognosis and high risk for recurrent pneumonia, sepsis, additional hospitalizations due to this.  They have strong faith which guides medical decision making.  Reviewed the difference between EGD and swallow evaluations, role of SLP, and poor prognosis for patient's ability to protect her airway.  Family is very appreciative of the information.  Goals at this time are to for her  to get stronger and they understand that she may need to remain n.p.o. for the foreseeable future.  They understand that may not prevent additional aspiration events.  We discussed CODE STATUS and family is on agreement that they would accept all aggressive interventions offered besides a tracheostomy.  They feel that patient has expressed this in the past as well.  Patient's family also express concern for communication of turning and reassurance was provided alongside Charity fundraiser. Outpatient palliative care was explained and offered. Family are agreeable to a referral.  Questions and concerns addressed. PMT will continue to support holistically.   Length of Stay: 4   Physical Exam Vitals and nursing note reviewed.  Constitutional:  General: She is sleeping. She is not in acute distress.    Interventions: Nasal cannula in place.  Cardiovascular:     Rate and Rhythm: Normal rate.  Pulmonary:     Effort: Pulmonary effort is normal.  Neurological:     Motor: Weakness present.            Vital Signs: BP (!) 150/74 (BP Location: Left Arm)   Pulse 97   Temp 99.3 F (37.4 C) (Axillary)   Resp 20   Ht 5\' 3"  (1.6 m)   Wt 57.3 kg   SpO2 96%   BMI 22.38 kg/m  SpO2: SpO2: 96 % O2 Device: O2 Device: Nasal Cannula O2 Flow Rate: O2 Flow Rate (L/min): 2 L/min      Palliative Assessment/Data: 30% (on tube feeds)   Palliative Care Assessment & Plan   Patient Profile: 87 y.o. female  with past medical history of anemia, CVA with residual L sided deficits and dysarthria, hypertension, GERD, hyperlipidemia, type 2 diabetes presented to ED on 02/13/23 from [facility] with AMS and hypotension. Patient was admitted on 02/13/2023 with sepsis secondary to multifocal pneumonia, possible aspiration pneumonia, malnutrition of moderate degree, AKI, recent SDH/ICH s/p craniotomy with residual left hemiparesis/dysphagia requiring PEG tube.    Of note, patient has had 2 admissions and 1 ED visit in the  last 6 months. She was seen extensively by PMT during previous Jan 2024 hospitalization.   Assessment: Goals of care conversation Acute metabolic encephalopathy Sepsis secondary to multifocal pneumonia, likely aspiration Moderate protein calorie malnutrition Recent SDH/ICH status post craniotomy with residual left hemiparesis/dysphagia/dysarthria and PEG tube in place Bladder outlet obstruction requiring chronic Foley  Recommendations/Plan: Continue full code/full scope treatment Education was provided. Goals of care are clear for improvement and return to [name] [facility] consulted for outpatient palliative care referral, assistance is appreciated Psychosocial and emotional support provided PMT will continue to follow and support as needed   Prognosis: Guarded to poor  Discharge Planning: Skilled Nursing Facility for rehab with Palliative care service follow-up  Care plan was discussed with patient's husband, adult children, RN   Total time: I spent 75 minutes in the care of the patient today in the above activities and documenting the encounter.          Emerald Gehres Jeni Salles, PA-C  Palliative Medicine Team Team phone # [phone]  Thank you for allowing the Palliative Medicine Team to assist in the care of this patient. Please utilize secure chat with additional questions, if there is no response within 30 minutes please call the above phone number.  Palliative Medicine Team providers are available by phone from 7am to 7pm daily and can be reached through the team cell phone.  Should this patient require assistance outside of these hours, please call the patient's attending physician.

## 2023-02-17 NOTE — Progress Notes (Signed)
PROGRESS NOTE        PATIENT DETAILS Name: Gina Kelley Age: 87 y.o. Sex: female Date of Birth: 1935-07-23 Admit Date: 02/13/2023 Admitting Physician [name], DO PCP:No primary care provider on file.  Brief Summary: Patient is a 87 y.o.  female with history of ICH/SDH s/p craniotomy earlier this year-with resultant left-sided hemiparesis/dysphagia requiring PEG tube-resident of [name]-presenting with sepsis/AKI in the setting of presumed aspiration pneumonia.  Significant events: 8/16>> admit to [facility]  Significant studies: 8/16>> CXR: Multifocal infiltrates 8/16>> CT head: No acute abnormality  Significant microbiology data: 8/16>> COVID PCR: Negative 8/16>> urine culture: Proteus/Enterococcus 8/16>> blood culture: No growth  Procedures: None  Consults: None  Subjective: Much more awake today-no major issues overnight.  Objective: Vitals: Blood pressure (!) 150/74, pulse 97, temperature 99.3 F (37.4 C), temperature source Axillary, resp. rate 20, height 5\' 3"  (1.6 m), weight 57.3 kg, SpO2 96%.   Exam: Gen Exam: More awake-not in any distress. HEENT:atraumatic, normocephalic Chest: B/L clear to auscultation anteriorly CVS:S1S2 regular Abdomen:soft non tender, non distended Extremities:no edema Neurology: Left hemiparesis at baseline Skin: no rash  Pertinent Labs/Radiology:    Latest Ref Rng & Units 02/16/2023    2:05 AM 02/14/2023   10:50 AM 02/13/2023    5:15 PM  CBC  WBC 4.0 - 10.5 K/uL 8.9  14.1  18.2   Hemoglobin 12.0 - 15.0 g/dL 9.2  40.9  9.1   Hematocrit 36.0 - 46.0 % 28.3  32.3  29.0   Platelets 150 - 400 K/uL 216  196  198     Lab Results  Component Value Date   NA 134 (L) 02/17/2023   K 4.5 02/17/2023   CL 98 02/17/2023   CO2 23 02/17/2023      Assessment/Plan: Sepsis secondary to aspiration pneumonia (present on admission) Sepsis physiology resolved Continue IV Unasyn through today-suspect if clinical  improvement continues-likely can be switched to Augmentin tomorrow Frequent suctioning/pulmonary tolerating Per my discussion with family on 8/18-full scope of treatment-full code.  Family is aware that given her neuromuscular weakness/debility-she is always going to be at risk for pneumonia-and will likely have recurrent pneumonias in the future.   Has PEG tube-remains n.p.o. SLP to see today.  Acute metabolic encephalopathy Much more/alert this morning Likely due to sepsis/aspiration pneumonia  AKI Hemodynamically mediated Resolved  Asymptomatic bacteriuria Urine culture likely reflects colonization rather than a true infection Already on Unasyn in any event Per nursing staff-has a chronic indwelling Foley catheter that was placed at [name] for bladder outlet obstruction.  Hypokalemia/hypophosphatemia/hypomagnesemia K/Mg stable-phosphorus continues to be low-being repleted.  Recent SDH/ICH-s/p craniotomy with residual left hemiparesis/dysphagia requiring PEG tube PT/OT eval SLP eval Back to [name] when stable Will always remain at aspiration risk given her neuromuscular weakness.  Family aware-elect to continue full scope of treatment/full code.  Seizure disorder Keppra  HTN BP stable Norvasc/Coreg  HLD Zetia/Lipitor  DM-2 (A1c 5.8 on 8/17) CBG stable-SSI  Recent Labs    02/16/23 1611 02/16/23 2135 02/17/23 0804  GLUCAP 166* 183* 250*    Mood disorder Prozac  Bladder outlet obstruction requiring chronic indwelling Foley catheter Per nursing staff-presented to this hospital with a Foley catheter-this was exchanged in the ED.  Palliative care Unfortunate 87 year old with recent history of SDH/ICH-with left-sided residual hemiparesis/dysphagia requiring PEG tube-here with aspiration pneumonia.  Although she is somewhat improved-she  remains at significant risk for future decompensation events as she will always remain at risk for aspiration episodes.  Long discussion  with multiple family members on 8/18-they desire full scope of treatment-full code.  Palliative care continues to follow.  Nutrition Status: Nutrition Problem: Inadequate oral intake Etiology: dysphagia Signs/Symptoms: NPO status (pt with chronic G-tube)    BMI: Estimated body mass index is 22.38 kg/m as calculated from the following:   Height as of this encounter: 5\' 3"  (1.6 m).   Weight as of this encounter: 57.3 kg.   Code status:   Code Status: Full Code   DVT Prophylaxis: SCDs Start: 02/14/23 1026   Family Communication: Daughter-[name] Senaida Ores [phone] over the phone on 8/20.   Disposition Plan: Status is: Inpatient Remains inpatient appropriate because: Severity of illness   Planned Discharge Destination:Skilled nursing facility   Diet: Diet Order             Diet NPO time specified Except for: Sips with Meds  Diet effective now                     Antimicrobial agents: Anti-infectives (From admission, onward)    Start     Dose/Rate Route Frequency Ordered Stop   02/15/23 1015  Ampicillin-Sulbactam (UNASYN) 3 g in sodium chloride 0.9 % 100 mL IVPB        3 g 200 mL/hr over 30 Minutes Intravenous Every 8 hours 02/15/23 0938 02/20/23 1029   02/14/23 1400  azithromycin (ZITHROMAX) tablet 500 mg  Status:  Discontinued        500 mg Per Tube Daily 02/14/23 1235 02/15/23 0914   02/14/23 1025  cefTRIAXone (ROCEPHIN) 2 g in sodium chloride 0.9 % 100 mL IVPB  Status:  Discontinued        2 g 200 mL/hr over 30 Minutes Intravenous Every 24 hours 02/14/23 1025 02/15/23 0914   02/14/23 1025  azithromycin (ZITHROMAX) 500 mg in sodium chloride 0.9 % 250 mL IVPB  Status:  Discontinued        500 mg 250 mL/hr over 60 Minutes Intravenous Every 24 hours 02/14/23 1025 02/14/23 1040   02/13/23 1730  cefTRIAXone (ROCEPHIN) 2 g in sodium chloride 0.9 % 100 mL IVPB  Status:  Discontinued        2 g 200 mL/hr over 30 Minutes Intravenous Every 24 hours 02/13/23  1725 02/14/23 1040   02/13/23 1730  azithromycin (ZITHROMAX) 500 mg in sodium chloride 0.9 % 250 mL IVPB  Status:  Discontinued        500 mg 250 mL/hr over 60 Minutes Intravenous Every 24 hours 02/13/23 1725 02/14/23 1235        MEDICATIONS: Scheduled Meds:  amLODipine  10 mg Per Tube Daily   ascorbic acid  500 mg Per Tube Daily   atorvastatin  80 mg Per Tube Daily   carvedilol  6.25 mg Per Tube BID WC   ezetimibe  10 mg Per Tube QHS   famotidine  20 mg Per Tube BID   feeding supplement (GLUCERNA 1.5 CAL)  1,000 mL Per Tube Q24H   ferrous sulfate  408 mg Per Tube Daily   FLUoxetine  20 mg Per Tube QHS   free water  120 mL Per Tube Q4H   gabapentin  100 mg Per Tube Q8H   guaiFENesin  200 mg Per Tube QID   insulin aspart  0-16 Units Subcutaneous TID WC & HS   l-methylfolate-B6-B12  1 tablet Per Tube  Daily   levETIRAcetam  500 mg Per Tube BID   multivitamin with minerals  1 tablet Per Tube Daily   sodium chloride flush  3 mL Intravenous Q12H   Continuous Infusions:  ampicillin-sulbactam (UNASYN) IV 3 g (02/17/23 0907)   sodium phosphate 30 mmol in dextrose 5 % 250 mL infusion 30 mmol (02/17/23 0904)   PRN Meds:.acetaminophen **OR** acetaminophen, albuterol, bisacodyl, hydrALAZINE, HYDROcodone-acetaminophen, ipratropium, morphine injection, ondansetron **OR** ondansetron (ZOFRAN) IV, senna-docusate, traZODone   I have personally reviewed following labs and imaging studies  LABORATORY DATA: CBC: Recent Labs  Lab 02/13/23 1715 02/14/23 1050 02/16/23 0205  WBC 18.2* 14.1* 8.9  NEUTROABS 15.0*  --   --   HGB 9.1* 10.1* 9.2*  HCT 29.0* 32.3* 28.3*  MCV 100.0 102.2* 95.3  PLT 198 196 216    Basic Metabolic Panel: Recent Labs  Lab 02/13/23 1715 02/14/23 1050 02/15/23 0404 02/16/23 0205 02/17/23 0143  NA 135 137 139 137 134*  K 3.8 3.5 3.2* 3.2* 4.5  CL 98 99 101 100 98  CO2 27 26 29 29 23   GLUCOSE 77 76 187* 173* 202*  BUN 48* 26* 20 16 12   CREATININE 1.46*  0.75 0.75 0.65 0.64  CALCIUM 8.9 8.4* 8.4* 7.9* 8.1*  MG  --  1.6* 1.6* 2.3 2.0  PHOS  --  2.7 1.3* 2.4* 2.2*    GFR: Estimated Creatinine Clearance: 42.5 mL/min (by C-G formula based on SCr of 0.64 mg/dL).  Liver Function Tests: Recent Labs  Lab 02/13/23 1715 02/14/23 1050  AST 42* 48*  ALT 31 30  ALKPHOS 61 64  BILITOT 0.5 0.6  PROT 6.6 6.7  ALBUMIN 2.6* 2.6*   Recent Labs  Lab 02/13/23 1715  LIPASE 49   No results for input(s): "AMMONIA" in the last 168 hours.  Coagulation Profile: Recent Labs  Lab 02/13/23 1715 02/14/23 1050  INR 1.2 1.0    Cardiac Enzymes: No results for input(s): "CKTOTAL", "CKMB", "CKMBINDEX", "TROPONINI" in the last 168 hours.  BNP (last 3 results) No results for input(s): "PROBNP" in the last 8760 hours.  Lipid Profile: No results for input(s): "CHOL", "HDL", "LDLCALC", "TRIG", "CHOLHDL", "LDLDIRECT" in the last 72 hours.  Thyroid Function Tests: No results for input(s): "TSH", "T4TOTAL", "FREET4", "T3FREE", "THYROIDAB" in the last 72 hours.  Anemia Panel: No results for input(s): "VITAMINB12", "FOLATE", "FERRITIN", "TIBC", "IRON", "RETICCTPCT" in the last 72 hours.  Urine analysis:    Component Value Date/Time   COLORURINE YELLOW 02/13/2023 1750   APPEARANCEUR CLOUDY (A) 02/13/2023 1750   LABSPEC 1.018 02/13/2023 1750   PHURINE 5.0 02/13/2023 1750   GLUCOSEU NEGATIVE 02/13/2023 1750   HGBUR NEGATIVE 02/13/2023 1750   BILIRUBINUR NEGATIVE 02/13/2023 1750   KETONESUR NEGATIVE 02/13/2023 1750   PROTEINUR 100 (A) 02/13/2023 1750   NITRITE NEGATIVE 02/13/2023 1750   LEUKOCYTESUR LARGE (A) 02/13/2023 1750    Sepsis Labs: Lactic Acid, Venous    Component Value Date/Time   LATICACIDVEN 1.0 02/14/2023 1102    MICROBIOLOGY: Recent Results (from the past 240 hour(s))  Blood Culture (routine x 2)     Status: None (Preliminary result)   Collection Time: 02/13/23  4:27 PM   Specimen: BLOOD RIGHT HAND  Result Value Ref Range  Status   Specimen Description BLOOD RIGHT HAND  Final   Special Requests   Final    BOTTLES DRAWN AEROBIC AND ANAEROBIC Blood Culture results may not be optimal due to an inadequate volume of blood received in culture bottles  Culture   Final    NO GROWTH 4 DAYS Performed at [facility], [address] [place], [place] [address]    Report Status PENDING  Incomplete  SARS Coronavirus 2 by RT PCR (hospital order, performed in [facility] hospital lab) *cepheid single result test* Anterior Nasal Swab     Status: None   Collection Time: 02/13/23  4:38 PM   Specimen: Anterior Nasal Swab  Result Value Ref Range Status   SARS Coronavirus 2 by RT PCR NEGATIVE NEGATIVE Final    Comment: Performed at [facility], [address], [place], [place] [address]  Blood Culture (routine x 2)     Status: None (Preliminary result)   Collection Time: 02/13/23  5:50 PM   Specimen: BLOOD  Result Value Ref Range Status   Specimen Description BLOOD RIGHT ANTECUBITAL  Final   Special Requests   Final    BOTTLES DRAWN AEROBIC AND ANAEROBIC Blood Culture results may not be optimal due to an inadequate volume of blood received in culture bottles   Culture   Final    NO GROWTH 4 DAYS Performed at [facility], [address], [place], [place] [address]    Report Status PENDING  Incomplete  Urine Culture     Status: Abnormal   Collection Time: 02/13/23  5:50 PM   Specimen: Urine, Random  Result Value Ref Range Status   Specimen Description URINE, RANDOM  Final   Special Requests   Final    NONE Reflexed from [id number] Performed at [facility], [address] [place], [place] [address]    Culture (A)  Final    90,000 COLONIES/mL PROTEUS MIRABILIS 30,000 COLONIES/mL ENTEROCOCCUS FAECALIS    Report Status 02/16/2023 FINAL  Final   Organism ID, Bacteria PROTEUS MIRABILIS (A)  Final   Organism ID, Bacteria ENTEROCOCCUS FAECALIS (A)  Final      Susceptibility   Enterococcus faecalis  - MIC*    AMPICILLIN <=2 SENSITIVE Sensitive     NITROFURANTOIN <=16 SENSITIVE Sensitive     VANCOMYCIN 1 SENSITIVE Sensitive     * 30,000 COLONIES/mL ENTEROCOCCUS FAECALIS   Proteus mirabilis - MIC*    AMPICILLIN <=2 SENSITIVE Sensitive     CEFAZOLIN <=4 SENSITIVE Sensitive     CEFEPIME <=0.12 SENSITIVE Sensitive     CEFTRIAXONE <=0.25 SENSITIVE Sensitive     CIPROFLOXACIN <=0.25 SENSITIVE Sensitive     GENTAMICIN <=1 SENSITIVE Sensitive     IMIPENEM 2 SENSITIVE Sensitive     NITROFURANTOIN 128 RESISTANT Resistant     TRIMETH/SULFA <=20 SENSITIVE Sensitive     AMPICILLIN/SULBACTAM <=2 SENSITIVE Sensitive     PIP/TAZO <=4 SENSITIVE Sensitive     * 90,000 COLONIES/mL PROTEUS MIRABILIS    RADIOLOGY STUDIES/RESULTS: No results found.   LOS: 4 days   Jeoffrey Massed, MD  [facility]    To contact the attending provider between 7A-7P or the covering provider during after hours 7P-7A, please log into the web site [email or web address] and access using universal [place] password for that web site. If you do not have the password, please call the hospital operator.  02/17/2023, 10:07 AM

## 2023-02-18 DIAGNOSIS — I10 Essential (primary) hypertension: Secondary | ICD-10-CM

## 2023-02-18 DIAGNOSIS — J189 Pneumonia, unspecified organism: Secondary | ICD-10-CM | POA: Diagnosis not present

## 2023-02-18 DIAGNOSIS — N181 Chronic kidney disease, stage 1: Secondary | ICD-10-CM | POA: Diagnosis not present

## 2023-02-18 DIAGNOSIS — E781 Pure hyperglyceridemia: Secondary | ICD-10-CM | POA: Diagnosis not present

## 2023-02-18 LAB — MAGNESIUM: Magnesium: 2 mg/dL (ref 1.7–2.4)

## 2023-02-18 LAB — CULTURE, BLOOD (ROUTINE X 2)
Culture: NO GROWTH
Culture: NO GROWTH

## 2023-02-18 LAB — PHOSPHORUS: Phosphorus: 4 mg/dL (ref 2.5–4.6)

## 2023-02-18 LAB — BASIC METABOLIC PANEL
Anion gap: 6 (ref 5–15)
BUN: 14 mg/dL (ref 8–23)
CO2: 30 mmol/L (ref 22–32)
Calcium: 7.9 mg/dL — ABNORMAL LOW (ref 8.9–10.3)
Chloride: 99 mmol/L (ref 98–111)
Creatinine, Ser: 0.77 mg/dL (ref 0.44–1.00)
GFR, Estimated: >60 mL/min (ref >60)
Glucose, Bld: 126 mg/dL — ABNORMAL HIGH (ref 70–99)
Potassium: 3.8 mmol/L (ref 3.5–5.1)
Sodium: 135 mmol/L (ref 135–145)

## 2023-02-18 LAB — GLUCOSE, CAPILLARY
Glucose-Capillary: 152 mg/dL — ABNORMAL HIGH (ref 70–99)
Glucose-Capillary: 159 mg/dL — ABNORMAL HIGH (ref 70–99)

## 2023-02-18 MED ORDER — TRAZODONE HCL 50 MG PO TABS: 25.0000 mg | ORAL_TABLET | Freq: Every evening | ORAL | Status: DC | PRN

## 2023-02-18 MED ORDER — IPRATROPIUM-ALBUTEROL 0.5-2.5 (3) MG/3ML IN SOLN: 3.0000 mL | RESPIRATORY_TRACT | Status: DC | PRN

## 2023-02-18 MED ORDER — AMOXICILLIN-POT CLAVULANATE 250-62.5 MG/5ML PO SUSR: 875.0000 mg | Freq: Two times a day (BID) | ORAL | Status: AC

## 2023-02-18 MED ORDER — FREE WATER: 120.0000 mL | Status: DC

## 2023-02-18 MED ORDER — GLUCERNA 1.5 CAL PO LIQD: 1000.0000 mL | ORAL | Status: DC

## 2023-02-18 MED ORDER — SENNOSIDES-DOCUSATE SODIUM 8.6-50 MG PO TABS: 1.0000 | ORAL_TABLET | Freq: Every evening | ORAL | Status: DC | PRN

## 2023-02-18 NOTE — TOC CM/SW Note (Deleted)
Patient will require NIV at Discharge   Patients condition quickly deteriorates without NIV. Removal of the ventilator may cause serious harm to the patient, exacerbation of condition and hospital readmission.   Bilevel/RAD has been tried and failed to maintain or stabilize the patient. Bilevel cannot meet current volume requirements. Patient requires frequent durations of ventilatory support. Intermittent usage is insufficient.

## 2023-02-18 NOTE — TOC Transition Note (Signed)
Transition of Care [facility]) - CM/SW Discharge Note   Patient Details  Name: Gina Kelley MRN: [id number] Date of Birth: Mar 30, 1936  Transition of Care [facility]) CM/SW Contact:  Erin Sons, LCSW Phone Number: 02/18/2023, 12:16 PM   Clinical Narrative:      Per MD patient ready for DC to [facility] RN, patient, patient's family, and facility notified of DC. Discharge Summary and FL2 sent to facility. RN to call report prior to discharge [phone]). DC packet on chart. Ambulance transport requested for patient.   CSW will sign off for now as social work intervention is no longer needed. Please consult [place] again if new needs arise.   Final next level of care: Skilled Nursing Facility Barriers to Discharge: No Barriers Identified   Patient Goals and CMS Choice      Discharge Placement                Patient chooses bed at: [facility] Patient to be transferred to facility by: [facility] Name of family member notified: Daughter [name] Patient and family notified of of transfer: 02/18/23  Discharge Plan and Services Additional resources added to the After Visit Summary for                                       Social Determinants of Health (SDOH) Interventions SDOH Screenings   Food Insecurity: Patient Declined (02/17/2023)  Housing: Patient Declined (02/17/2023)  Transportation Needs: Patient Declined (02/17/2023)  Utilities: Patient Declined (02/17/2023)  Depression (PHQ2-9): Low Risk  (11/15/2021)  Tobacco Use: Medium Risk (02/13/2023)     Readmission Risk Interventions     No data to display

## 2023-02-18 NOTE — Discharge Summary (Signed)
PATIENT DETAILS Name: Gina Kelley Age: 87 y.o. Sex: female Date of Birth: 07-Jul-1935 MRN: [id number]. Admitting Physician: [name], DO PCP:No primary care provider on file.  Admit Date: 02/13/2023 Discharge date: 02/18/2023  Recommendations for Outpatient Follow-up:  Follow up with PCP in 1-2 weeks Please obtain CMP/CBC in one week SLP follow-up at [name] Palliative care follow-up at [name] Keep NPO for now-until further re-eval by SLP at [name]  Admitted From:  [name]  Disposition: Skilled nursing facility   Discharge Condition: fair  CODE STATUS:   Code Status: Full Code   Diet recommendation:  Diet Order             Diet NPO time specified Except for: Sips with Meds  Diet effective now                    Brief Summary: Patient is a 87 y.o.  female with history of ICH/SDH s/p craniotomy earlier this year-with resultant left-sided hemiparesis/dysphagia requiring PEG tube-resident of [name]-presenting with sepsis/AKI in the setting of presumed aspiration pneumonia.   Significant events: 8/16>> admit to [facility]   Significant studies: 8/16>> CXR: Multifocal infiltrates 8/16>> CT head: No acute abnormality   Significant microbiology data: 8/16>> COVID PCR: Negative 8/16>> urine culture: Proteus/Enterococcus 8/16>> blood culture: No growth   Procedures: None   Consults: None  Brief Hospital Course: Sepsis secondary to aspiration pneumonia (present on admission) Sepsis physiology resolved-treated with IV Unasyn-kept n.p.o.-required frequent suctioning/pulmonary tolerating Extensive discussion with family on 8/18-full code-full scope of treatment. Family is aware that given her neuromuscular weakness/debility-she is always going to be at risk for pneumonia-and will likely have recurrent pneumonias in the future.   Evaluated by SLP-to remain n.p.o.-has PEG tube in place Will be discharged to [name] on oral Augmentin-SLP to reevaluate at [name] to see if diet can  be near future.   Acute metabolic encephalopathy Likely due to sepsis/aspiration pneumonia Gradual improved-chronically frail appearing-but much more awake/alert this morning-dysarthric speech-speaks slowly-follows commands.   AKI Hemodynamically mediated Resolved   Asymptomatic bacteriuria Urine culture likely reflects colonization rather than a true infection Already on antibiotic in any event. Per nursing staff-has a chronic indwelling Foley catheter that was placed at [name] for bladder outlet obstruction.   Hypokalemia/hypophosphatemia/hypomagnesemia Repleted.   Recent SDH/ICH-s/p craniotomy with residual left hemiparesis/dysphagia requiring PEG tube Continue nothing by mouth status-needs repeat SLP evaluation at [name]. Will always remain at aspiration risk given her neuromuscular weakness.  Family aware-elect to continue full scope of treatment/full code.   Seizure disorder Keppra   HTN BP stable Norvasc/Coreg   HLD Zetia/Lipitor   DM-2 (A1c 5.8 on 8/17) CBG stable-SSI  Mood disorder Prozac   Bladder outlet obstruction requiring chronic indwelling Foley catheter Per nursing staff-presented to this hospital with a Foley catheter-this was exchanged in the ED. Continue scheduled monthly changes at [name]   Palliative care Unfortunate 87 year old with recent history of SDH/ICH-with left-sided residual hemiparesis/dysphagia requiring PEG tube-here with aspiration pneumonia.  Thankfully she improved with supportive care/IV antibiotics-however-she remains at significant risk for future decompensation events as she will always remain at risk for aspiration episodes.  Long discussion with multiple family members on 8/18-they desire full scope of treatment-full code.  Palliative care followed as well during this hospitalization.  Suggest that palliative care continue to engage with patient/family while at [name].   Nutrition Status: Nutrition Problem: Inadequate oral intake Etiology:  dysphagia Signs/Symptoms: NPO status (pt with chronic G-tube)   BMI: Estimated body mass index  is 22.38 kg/m as calculated from the following:   Height as of this encounter: 5\' 3"  (1.6 m).   Weight as of this encounter: 57.3 kg.    Discharge Diagnoses:  Principal Problem:   Multifocal pneumonia Active Problems:   Type 2 diabetes mellitus with stage 1 chronic kidney disease, without long-term current use of insulin (HCC)   Hyperlipidemia   Anemia in chronic renal disease   Essential hypertension   Gastroesophageal reflux disease without esophagitis   Malnutrition of moderate degree   Subdural hematoma (HCC)   Sepsis with acute renal failure without septic shock [facility])   Discharge Instructions:  Activity:  As tolerated with Full fall precautions use walker/cane & assistance as needed  Discharge Instructions     Call MD for:  difficulty breathing, headache or visual disturbances   Complete by: As directed    Call MD for:  persistant nausea and vomiting   Complete by: As directed    Discharge instructions   Complete by: As directed    Follow with Primary MD  No primary care provider on file. in 1-2 weeks  Please get a complete blood count and chemistry panel checked by your Primary MD at your next visit, and again as instructed by your Primary MD.  Get Medicines reviewed and adjusted: Please take all your medications with you for your next visit with your Primary MD  Laboratory/radiological data: Please request your Primary MD to go over all hospital tests and procedure/radiological results at the follow up, please ask your Primary MD to get all Hospital records sent to his/her office.  In some cases, they will be blood work, cultures and biopsy results pending at the time of your discharge. Please request that your primary care M.D. follows up on these results.  Also Note the following: If you experience worsening of your admission symptoms, develop shortness of breath,  life threatening emergency, suicidal or homicidal thoughts you must seek medical attention immediately by calling 911 or calling your MD immediately  if symptoms less severe.  You must read complete instructions/literature along with all the possible adverse reactions/side effects for all the Medicines you take and that have been prescribed to you. Take any new Medicines after you have completely understood and accpet all the possible adverse reactions/side effects.   Do not drive when taking Pain medications or sleeping medications (Benzodaizepines)  Do not take more than prescribed Pain, Sleep and Anxiety Medications. It is not advisable to combine anxiety,sleep and pain medications without talking with your primary care practitioner  Special Instructions: If you have smoked or chewed Tobacco  in the last 2 yrs please stop smoking, stop any regular Alcohol  and or any Recreational drug use.  Wear Seat belts while driving.  Please note: You were cared for by a hospitalist during your hospital stay. Once you are discharged, your primary care physician will handle any further medical issues. Please note that NO REFILLS for any discharge medications will be authorized once you are discharged, as it is imperative that you return to your primary care physician (or establish a relationship with a primary care physician if you do not have one) for your post hospital discharge needs so that they can reassess your need for medications and monitor your lab values.   Increase activity slowly   Complete by: As directed       Allergies as of 02/18/2023       Reactions   Cozaar [losartan] Swelling   Angioedema  Periogard [chlorhexidine Gluconate] Swelling, Other (See Comments)   Skin irritation Eye/tongue/lip swelling   Pork Allergy Other (See Comments)   Unknown reaction   Vasotec [enalapril] Cough        Medication List     TAKE these medications    acetaminophen 500 MG tablet Commonly  known as: TYLENOL Place 500-1,000 mg into feeding tube See admin instructions. 1000 mg via G-tube twice daily and 500 mg four times daily as needed for pain   amLODipine 10 MG tablet Commonly known as: NORVASC Place 1 tablet (10 mg total) into feeding tube daily.   amoxicillin-clavulanate 250-62.5 MG/5ML suspension Commonly known as: AUGMENTIN Take 17.5 mLs (875 mg total) by mouth 2 (two) times daily for 2 days.   ascorbic acid 500 MG tablet Commonly known as: VITAMIN C Place 1 tablet (500 mg total) into feeding tube daily.   atorvastatin 80 MG tablet Commonly known as: LIPITOR Place 1 tablet (80 mg total) into feeding tube daily.   carvedilol 6.25 MG tablet Commonly known as: COREG Place 1 tablet (6.25 mg total) into feeding tube 2 (two) times daily with a meal.   diclofenac Sodium 1 % Gel Commonly known as: VOLTAREN Apply 2 g topically 4 (four) times daily.   ezetimibe 10 MG tablet Commonly known as: ZETIA Place 1 tablet (10 mg total) into feeding tube daily. What changed: when to take this   famotidine 20 MG tablet Commonly known as: PEPCID Place 1 tablet (20 mg total) into feeding tube 2 (two) times daily.   feeding supplement (GLUCERNA 1.5 CAL) Liqd Place 1,000 mLs into feeding tube daily. What changed:  how much to take when to take this   ferrous sulfate 300 (60 Fe) MG/5ML syrup Place 5 mLs (300 mg total) into feeding tube daily with breakfast. What changed:  how much to take when to take this   FLUoxetine 20 MG capsule Commonly known as: PROZAC Place 1 capsule (20 mg total) into feeding tube at bedtime.   free water Soln Place 120 mLs into feeding tube every 4 (four) hours. What changed: when to take this   gabapentin 250 MG/5ML solution Commonly known as: NEURONTIN Place 2 mLs (100 mg total) into feeding tube every 8 (eight) hours.   guaiFENesin 100 MG/5ML liquid Commonly known as: ROBITUSSIN Place 200 mg into feeding tube 4 (four) times daily.    insulin lispro 100 UNIT/ML KwikPen Commonly known as: HUMALOG Inject 0-15 Units into the skin See admin instructions. Inject 0-15 units 4 times daily per sliding scale: < 69 : implement hypoglycemic protocol 70-150 : 0 units 151-200 : 3 units 201-250 : 6 units 251-300 : 9 units 301-350 : 12 units 351-400 : 16 units > 401 : call MD   ipratropium-albuterol 0.5-2.5 (3) MG/3ML Soln Commonly known as: DUONEB Take 3 mLs by nebulization every 4 (four) hours as needed.   l-methylfolate-B6-B12 3-35-2 MG Tabs tablet Commonly known as: METANX Place 1 tablet into feeding tube daily.   levETIRAcetam 100 MG/ML solution Commonly known as: Keppra Place 5 mLs (500 mg total) into feeding tube 2 (two) times daily.   lidocaine 4 % Place 1 patch onto the skin daily.   metFORMIN 500 MG tablet Commonly known as: GLUCOPHAGE Place 1 tablet (500 mg total) into feeding tube 2 (two) times daily with a meal. What changed:  how much to take when to take this   ondansetron 4 MG tablet Commonly known as: ZOFRAN Take 4 mg by mouth every 8 (  eight) hours as needed for nausea or vomiting.   PreserVision AREDS 2 Chew Give 1 each by tube 2 (two) times daily.        Follow-up Information     Primary care practitioner. Schedule an appointment as soon as possible for a visit in 1 week(s).                 Allergies  Allergen Reactions   Cozaar [Losartan] Swelling    Angioedema    Periogard [Chlorhexidine Gluconate] Swelling and Other (See Comments)    Skin irritation Eye/tongue/lip swelling   Pork Allergy Other (See Comments)    Unknown reaction   Vasotec [Enalapril] Cough     Other Procedures/Studies: CT Head Wo Contrast  Result Date: 02/13/2023 CLINICAL DATA:  Mental status change, unknown cause EXAM: CT HEAD WITHOUT CONTRAST TECHNIQUE: Contiguous axial images were obtained from the base of the skull through the vertex without intravenous contrast. RADIATION DOSE REDUCTION: This  exam was performed according to the departmental dose-optimization program which includes automated exposure control, adjustment of the mA and/or kV according to patient size and/or use of iterative reconstruction technique. COMPARISON:  CT head 01/06/2023. FINDINGS: Brain: Similar right frontal encephalomalacia. Remote left basal ganglia perforator infarct. No evidence of acute large vascular territory infarct, acute hemorrhage, mass lesion, midline shift or hydrocephalus. Vascular: No hyperdense vessel.  Calcific atherosclerosis. Skull: No acute fracture.  Right pterional craniotomy. Sinuses/Orbits: Clear sinuses.  No acute findings. Other: No mastoid effusions. IMPRESSION: 1. No evidence of acute intracranial abnormality. 2. Similar right frontal encephalomalacia and remote left basal ganglia perforator infarct. Electronically Signed   By: Feliberto Harts M.D.   On: 02/13/2023 17:32   DG Chest Port 1 View  Result Date: 02/13/2023 CLINICAL DATA:  Questionable sepsis EXAM: PORTABLE CHEST 1 VIEW COMPARISON:  01/06/2023 FINDINGS: No pneumothorax or effusion. Normal cardiopericardial silhouette. Calcified aorta. No edema. Overlapping cardiac leads. There are some patchy parenchymal infiltrative opacity seen in the right midlung, medial right lung base and left retrocardiac. Degenerative changes seen along the spine. Degenerative changes of the shoulders. IMPRESSION: Multifocal infiltrative opacities identified bilaterally. Recommend follow-up to confirm clearance. Possible infection or pneumonia. Electronically Signed   By: Karen Kays M.D.   On: 02/13/2023 17:14   EEG adult        [facility] [address] [place]. [place] [address] [phone]      Electroencephalogram Procedure Note Ms. Wynn Maudlin Date of Birth:  10/21/1935 Medical Record Number:  [id number] Indications: Diagnostic Date of Procedure  02/04/2023 Medications: gabapentin (Neurontin) Clinical history : 21 patient being  evaluated for seizure Technical Description This study was performed using 17 channel digital electroencephalographic recording equipment. International 10-20 electrode placement was used. The record was obtained with the patient awake, drowsy, and asleep.  The record is of poor technical quality for purposes of interpretation.  There are excessive movement artifacts throughout the recording Activation Procedures:  photic stimulation . EEG Description Awake: [place] Activity: The waking state record contains a well-defined bi-occipital alpha rhythm of  moderate amplitude with a dominant frequency of 7 Hz. Reactivity is uncertain.  Intermittent 6 to 7 Hz theta range slowing is noted throughout the recording. No paroxsymal activity, spikes, or sharp waves are noted. Technical component of study is suboptimal with excessive movement artifacts EKG tracing shows regular sinus rhythm Length of this recording is 25 minutes and 2 seconds Sleep: With drowsiness, there is attenuation of the background alpha activity. As the patient  enters into light sleep, vertex waves and symmetrical spindles are noted. K complexes are noted in sleep. Transition to the waking state is unremarkable. Result of Activation Procedures: Hyperventilation: N/A. Photo Stimulation: No photic driving response is noted. Summary This is a mildly abnormal EEG due to the presence of mild bihemispheric slowing which is a nonspecific finding seen in a variety of conditions including old age, dementia, toxic metabolic, hypoxic or ischemic etiologies.  No definite epileptiform activity was noted.     TODAY-DAY OF DISCHARGE:  Subjective:   Gina Kelley today has no headache,no chest abdominal pain,no new weakness tingling or numbness, feels much better wants to go home today.   Objective:   Blood pressure 127/68, pulse 77, temperature 98.6 F (37 C), temperature source Oral, resp. rate 15, height 5\' 3"  (1.6 m), weight 56.9 kg, SpO2  100%.  Intake/Output Summary (Last 24 hours) at 02/18/2023 0903 Last data filed at 02/18/2023 0509 Gross per 24 hour  Intake 1157.11 ml  Output --  Net 1157.11 ml   Filed Weights   02/15/23 0500 02/17/23 0500 02/18/23 0521  Weight: 53.3 kg 57.3 kg 56.9 kg    Exam: Awake Alert, Oriented *3, No new F.N deficits, Normal affect [place].AT,PERRAL Supple Neck,No JVD, No cervical lymphadenopathy appriciated.  Symmetrical Chest wall movement, Good air movement bilaterally, CTAB RRR,No Gallops,Rubs or new Murmurs, No Parasternal Heave +ve B.Sounds, Abd Soft, Non tender, No organomegaly appriciated, No rebound -guarding or rigidity. No Cyanosis, Clubbing or edema, No new Rash or bruise   PERTINENT RADIOLOGIC STUDIES: No results found.   PERTINENT LAB RESULTS: CBC: Recent Labs    02/16/23 0205  WBC 8.9  HGB 9.2*  HCT 28.3*  PLT 216   CMET CMP     Component Value Date/Time   NA 135 02/18/2023 0102   NA 141 07/12/2021 1053   K 3.8 02/18/2023 0102   CL 99 02/18/2023 0102   CO2 30 02/18/2023 0102   GLUCOSE 126 (H) 02/18/2023 0102   BUN 14 02/18/2023 0102   BUN 17 07/12/2021 1053   CREATININE 0.77 02/18/2023 0102   CREATININE 0.81 12/04/2011 1429   CALCIUM 7.9 (L) 02/18/2023 0102   PROT 6.7 02/14/2023 1050   PROT 7.0 07/12/2021 1053   ALBUMIN 2.6 (L) 02/14/2023 1050   ALBUMIN 3.8 07/12/2021 1053   AST 48 (H) 02/14/2023 1050   ALT 30 02/14/2023 1050   ALKPHOS 64 02/14/2023 1050   BILITOT 0.6 02/14/2023 1050   BILITOT 0.2 07/12/2021 1053   EGFR 79 07/12/2021 1053   GFRNONAA >60 02/18/2023 0102   GFRNONAA 72 12/04/2011 1429    GFR Estimated Creatinine Clearance: 42.5 mL/min (by C-G formula based on SCr of 0.77 mg/dL). No results for input(s): "LIPASE", "AMYLASE" in the last 72 hours. No results for input(s): "CKTOTAL", "CKMB", "CKMBINDEX", "TROPONINI" in the last 72 hours. Invalid input(s): "POCBNP" No results for input(s): "DDIMER" in the last 72 hours. No results for  input(s): "HGBA1C" in the last 72 hours. No results for input(s): "CHOL", "HDL", "LDLCALC", "TRIG", "CHOLHDL", "LDLDIRECT" in the last 72 hours. No results for input(s): "TSH", "T4TOTAL", "T3FREE", "THYROIDAB" in the last 72 hours.  Invalid input(s): "FREET3" No results for input(s): "VITAMINB12", "FOLATE", "FERRITIN", "TIBC", "IRON", "RETICCTPCT" in the last 72 hours. Coags: No results for input(s): "INR" in the last 72 hours.  Invalid input(s): "PT" Microbiology: Recent Results (from the past 240 hour(s))  Blood Culture (routine x 2)     Status: None   Collection Time: 02/13/23  4:27 PM   Specimen: BLOOD RIGHT HAND  Result Value Ref Range Status   Specimen Description BLOOD RIGHT HAND  Final   Special Requests   Final    BOTTLES DRAWN AEROBIC AND ANAEROBIC Blood Culture results may not be optimal due to an inadequate volume of blood received in culture bottles   Culture   Final    NO GROWTH 5 DAYS Performed at [facility], [address], [place], [place] [address]    Report Status 02/18/2023 FINAL  Final  SARS Coronavirus 2 by RT PCR (hospital order, performed in [facility] hospital lab) *cepheid single result test* Anterior Nasal Swab     Status: None   Collection Time: 02/13/23  4:38 PM   Specimen: Anterior Nasal Swab  Result Value Ref Range Status   SARS Coronavirus 2 by RT PCR NEGATIVE NEGATIVE Final    Comment: Performed at [facility], [address], [place], [place] [address]  Blood Culture (routine x 2)     Status: None   Collection Time: 02/13/23  5:50 PM   Specimen: BLOOD  Result Value Ref Range Status   Specimen Description BLOOD RIGHT ANTECUBITAL  Final   Special Requests   Final    BOTTLES DRAWN AEROBIC AND ANAEROBIC Blood Culture results may not be optimal due to an inadequate volume of blood received in culture bottles   Culture   Final    NO GROWTH 5 DAYS Performed at [facility], [address], [place], [place] [address]    Report  Status 02/18/2023 FINAL  Final  Urine Culture     Status: Abnormal   Collection Time: 02/13/23  5:50 PM   Specimen: Urine, Random  Result Value Ref Range Status   Specimen Description URINE, RANDOM  Final   Special Requests   Final    NONE Reflexed from [id number] Performed at [facility], [address] [place], [place] [address]    Culture (A)  Final    90,000 COLONIES/mL PROTEUS MIRABILIS 30,000 COLONIES/mL ENTEROCOCCUS FAECALIS    Report Status 02/16/2023 FINAL  Final   Organism ID, Bacteria PROTEUS MIRABILIS (A)  Final   Organism ID, Bacteria ENTEROCOCCUS FAECALIS (A)  Final      Susceptibility   Enterococcus faecalis - MIC*    AMPICILLIN <=2 SENSITIVE Sensitive     NITROFURANTOIN <=16 SENSITIVE Sensitive     VANCOMYCIN 1 SENSITIVE Sensitive     * 30,000 COLONIES/mL ENTEROCOCCUS FAECALIS   Proteus mirabilis - MIC*    AMPICILLIN <=2 SENSITIVE Sensitive     CEFAZOLIN <=4 SENSITIVE Sensitive     CEFEPIME <=0.12 SENSITIVE Sensitive     CEFTRIAXONE <=0.25 SENSITIVE Sensitive     CIPROFLOXACIN <=0.25 SENSITIVE Sensitive     GENTAMICIN <=1 SENSITIVE Sensitive     IMIPENEM 2 SENSITIVE Sensitive     NITROFURANTOIN 128 RESISTANT Resistant     TRIMETH/SULFA <=20 SENSITIVE Sensitive     AMPICILLIN/SULBACTAM <=2 SENSITIVE Sensitive     PIP/TAZO <=4 SENSITIVE Sensitive     * 90,000 COLONIES/mL PROTEUS MIRABILIS    FURTHER DISCHARGE INSTRUCTIONS:  Get Medicines reviewed and adjusted: Please take all your medications with you for your next visit with your Primary MD  Laboratory/radiological data: Please request your Primary MD to go over all hospital tests and procedure/radiological results at the follow up, please ask your Primary MD to get all Hospital records sent to his/her office.  In some cases, they will be  blood work, cultures and biopsy results pending at the time of your discharge. Please request that your primary care M.D. goes through all the records of your  hospital data and follows up on these results.  Also Note the following: If you experience worsening of your admission symptoms, develop shortness of breath, life threatening emergency, suicidal or homicidal thoughts you must seek medical attention immediately by calling 911 or calling your MD immediately  if symptoms less severe.  You must read complete instructions/literature along with all the possible adverse reactions/side effects for all the Medicines you take and that have been prescribed to you. Take any new Medicines after you have completely understood and accpet all the possible adverse reactions/side effects.   Do not drive when taking Pain medications or sleeping medications (Benzodaizepines)  Do not take more than prescribed Pain, Sleep and Anxiety Medications. It is not advisable to combine anxiety,sleep and pain medications without talking with your primary care practitioner  Special Instructions: If you have smoked or chewed Tobacco  in the last 2 yrs please stop smoking, stop any regular Alcohol  and or any Recreational drug use.  Wear Seat belts while driving.  Please note: You were cared for by a hospitalist during your hospital stay. Once you are discharged, your primary care physician will handle any further medical issues. Please note that NO REFILLS for any discharge medications will be authorized once you are discharged, as it is imperative that you return to your primary care physician (or establish a relationship with a primary care physician if you do not have one) for your post hospital discharge needs so that they can reassess your need for medications and monitor your lab values.  Total Time spent coordinating discharge including counseling, education and face to face time equals greater than 30 minutes.  SignedJeoffrey Massed 02/18/2023 9:03 AM

## 2023-02-20 DIAGNOSIS — I10 Essential (primary) hypertension: Secondary | ICD-10-CM | POA: Diagnosis not present

## 2023-02-20 DIAGNOSIS — A4189 Other specified sepsis: Secondary | ICD-10-CM | POA: Diagnosis not present

## 2023-02-20 DIAGNOSIS — N179 Acute kidney failure, unspecified: Secondary | ICD-10-CM | POA: Diagnosis not present

## 2023-02-20 DIAGNOSIS — G4089 Other seizures: Secondary | ICD-10-CM | POA: Diagnosis not present

## 2023-02-23 DIAGNOSIS — J181 Lobar pneumonia, unspecified organism: Secondary | ICD-10-CM | POA: Diagnosis not present

## 2023-02-23 DIAGNOSIS — A4189 Other specified sepsis: Secondary | ICD-10-CM | POA: Diagnosis not present

## 2023-02-23 DIAGNOSIS — R1312 Dysphagia, oropharyngeal phase: Secondary | ICD-10-CM | POA: Diagnosis not present

## 2023-02-23 DIAGNOSIS — I69354 Hemiplegia and hemiparesis following cerebral infarction affecting left non-dominant side: Secondary | ICD-10-CM | POA: Diagnosis not present

## 2023-02-23 DIAGNOSIS — I69391 Dysphagia following cerebral infarction: Secondary | ICD-10-CM | POA: Diagnosis not present

## 2023-02-23 DIAGNOSIS — M6281 Muscle weakness (generalized): Secondary | ICD-10-CM | POA: Diagnosis not present

## 2023-02-23 DIAGNOSIS — E1165 Type 2 diabetes mellitus with hyperglycemia: Secondary | ICD-10-CM | POA: Diagnosis not present

## 2023-02-23 DIAGNOSIS — J189 Pneumonia, unspecified organism: Secondary | ICD-10-CM | POA: Diagnosis not present

## 2023-02-24 DIAGNOSIS — G4089 Other seizures: Secondary | ICD-10-CM | POA: Diagnosis not present

## 2023-02-24 DIAGNOSIS — R1312 Dysphagia, oropharyngeal phase: Secondary | ICD-10-CM | POA: Diagnosis not present

## 2023-02-24 DIAGNOSIS — N179 Acute kidney failure, unspecified: Secondary | ICD-10-CM | POA: Diagnosis not present

## 2023-02-24 DIAGNOSIS — I10 Essential (primary) hypertension: Secondary | ICD-10-CM | POA: Diagnosis not present

## 2023-02-24 DIAGNOSIS — I69354 Hemiplegia and hemiparesis following cerebral infarction affecting left non-dominant side: Secondary | ICD-10-CM | POA: Diagnosis not present

## 2023-02-24 DIAGNOSIS — M6281 Muscle weakness (generalized): Secondary | ICD-10-CM | POA: Diagnosis not present

## 2023-02-24 DIAGNOSIS — A4189 Other specified sepsis: Secondary | ICD-10-CM | POA: Diagnosis not present

## 2023-02-24 DIAGNOSIS — J189 Pneumonia, unspecified organism: Secondary | ICD-10-CM | POA: Diagnosis not present

## 2023-02-24 DIAGNOSIS — I69391 Dysphagia following cerebral infarction: Secondary | ICD-10-CM | POA: Diagnosis not present

## 2023-02-25 DIAGNOSIS — I10 Essential (primary) hypertension: Secondary | ICD-10-CM | POA: Diagnosis not present

## 2023-02-25 DIAGNOSIS — M6281 Muscle weakness (generalized): Secondary | ICD-10-CM | POA: Diagnosis not present

## 2023-02-25 DIAGNOSIS — J189 Pneumonia, unspecified organism: Secondary | ICD-10-CM | POA: Diagnosis not present

## 2023-02-25 DIAGNOSIS — I69354 Hemiplegia and hemiparesis following cerebral infarction affecting left non-dominant side: Secondary | ICD-10-CM | POA: Diagnosis not present

## 2023-02-25 DIAGNOSIS — R1312 Dysphagia, oropharyngeal phase: Secondary | ICD-10-CM | POA: Diagnosis not present

## 2023-02-25 DIAGNOSIS — I69391 Dysphagia following cerebral infarction: Secondary | ICD-10-CM | POA: Diagnosis not present

## 2023-02-26 DIAGNOSIS — R1312 Dysphagia, oropharyngeal phase: Secondary | ICD-10-CM | POA: Diagnosis not present

## 2023-02-26 DIAGNOSIS — I69391 Dysphagia following cerebral infarction: Secondary | ICD-10-CM | POA: Diagnosis not present

## 2023-02-26 DIAGNOSIS — I69354 Hemiplegia and hemiparesis following cerebral infarction affecting left non-dominant side: Secondary | ICD-10-CM | POA: Diagnosis not present

## 2023-02-26 DIAGNOSIS — J189 Pneumonia, unspecified organism: Secondary | ICD-10-CM | POA: Diagnosis not present

## 2023-02-26 DIAGNOSIS — M6281 Muscle weakness (generalized): Secondary | ICD-10-CM | POA: Diagnosis not present

## 2023-02-27 DIAGNOSIS — I69391 Dysphagia following cerebral infarction: Secondary | ICD-10-CM | POA: Diagnosis not present

## 2023-02-27 DIAGNOSIS — I69354 Hemiplegia and hemiparesis following cerebral infarction affecting left non-dominant side: Secondary | ICD-10-CM | POA: Diagnosis not present

## 2023-02-27 DIAGNOSIS — M6281 Muscle weakness (generalized): Secondary | ICD-10-CM | POA: Diagnosis not present

## 2023-02-27 DIAGNOSIS — R1312 Dysphagia, oropharyngeal phase: Secondary | ICD-10-CM | POA: Diagnosis not present

## 2023-02-27 DIAGNOSIS — J189 Pneumonia, unspecified organism: Secondary | ICD-10-CM | POA: Diagnosis not present

## 2023-02-28 DIAGNOSIS — I69354 Hemiplegia and hemiparesis following cerebral infarction affecting left non-dominant side: Secondary | ICD-10-CM | POA: Diagnosis not present

## 2023-02-28 DIAGNOSIS — R1312 Dysphagia, oropharyngeal phase: Secondary | ICD-10-CM | POA: Diagnosis not present

## 2023-02-28 DIAGNOSIS — J189 Pneumonia, unspecified organism: Secondary | ICD-10-CM | POA: Diagnosis not present

## 2023-02-28 DIAGNOSIS — M6281 Muscle weakness (generalized): Secondary | ICD-10-CM | POA: Diagnosis not present

## 2023-02-28 DIAGNOSIS — I69391 Dysphagia following cerebral infarction: Secondary | ICD-10-CM | POA: Diagnosis not present

## 2023-03-01 ENCOUNTER — Emergency Department (HOSPITAL_COMMUNITY): Payer: Medicare HMO

## 2023-03-01 ENCOUNTER — Other Ambulatory Visit: Payer: Self-pay

## 2023-03-01 ENCOUNTER — Inpatient Hospital Stay (HOSPITAL_COMMUNITY)
Admission: EM | Admit: 2023-03-01 | Discharge: 2023-03-04 | DRG: 689 | Disposition: A | Discharge status: Skilled Nursing Facility | Payer: Medicare HMO | Source: Skilled Nursing Facility | Attending: Internal Medicine | Admitting: Internal Medicine

## 2023-03-01 ENCOUNTER — Encounter (HOSPITAL_COMMUNITY): Payer: Self-pay

## 2023-03-01 DIAGNOSIS — Z825 Family history of asthma and other chronic lower respiratory diseases: Secondary | ICD-10-CM

## 2023-03-01 DIAGNOSIS — Z8489 Family history of other specified conditions: Secondary | ICD-10-CM

## 2023-03-01 DIAGNOSIS — I69354 Hemiplegia and hemiparesis following cerebral infarction affecting left non-dominant side: Secondary | ICD-10-CM | POA: Diagnosis not present

## 2023-03-01 DIAGNOSIS — E871 Hypo-osmolality and hyponatremia: Secondary | ICD-10-CM | POA: Diagnosis present

## 2023-03-01 DIAGNOSIS — R1310 Dysphagia, unspecified: Secondary | ICD-10-CM

## 2023-03-01 DIAGNOSIS — I69320 Aphasia following cerebral infarction: Secondary | ICD-10-CM

## 2023-03-01 DIAGNOSIS — I69391 Dysphagia following cerebral infarction: Secondary | ICD-10-CM | POA: Diagnosis not present

## 2023-03-01 DIAGNOSIS — E1122 Type 2 diabetes mellitus with diabetic chronic kidney disease: Secondary | ICD-10-CM | POA: Diagnosis present

## 2023-03-01 DIAGNOSIS — N3000 Acute cystitis without hematuria: Secondary | ICD-10-CM | POA: Diagnosis not present

## 2023-03-01 DIAGNOSIS — N181 Chronic kidney disease, stage 1: Secondary | ICD-10-CM | POA: Diagnosis not present

## 2023-03-01 DIAGNOSIS — I959 Hypotension, unspecified: Secondary | ICD-10-CM | POA: Diagnosis present

## 2023-03-01 DIAGNOSIS — G9389 Other specified disorders of brain: Secondary | ICD-10-CM | POA: Diagnosis not present

## 2023-03-01 DIAGNOSIS — B9689 Other specified bacterial agents as the cause of diseases classified elsewhere: Secondary | ICD-10-CM | POA: Diagnosis present

## 2023-03-01 DIAGNOSIS — G9340 Encephalopathy, unspecified: Secondary | ICD-10-CM

## 2023-03-01 DIAGNOSIS — R7401 Elevation of levels of liver transaminase levels: Secondary | ICD-10-CM

## 2023-03-01 DIAGNOSIS — R4182 Altered mental status, unspecified: Secondary | ICD-10-CM | POA: Diagnosis not present

## 2023-03-01 DIAGNOSIS — Z7401 Bed confinement status: Secondary | ICD-10-CM | POA: Diagnosis not present

## 2023-03-01 DIAGNOSIS — Z794 Long term (current) use of insulin: Secondary | ICD-10-CM | POA: Diagnosis not present

## 2023-03-01 DIAGNOSIS — I440 Atrioventricular block, first degree: Secondary | ICD-10-CM | POA: Diagnosis present

## 2023-03-01 DIAGNOSIS — R471 Dysarthria and anarthria: Secondary | ICD-10-CM | POA: Diagnosis present

## 2023-03-01 DIAGNOSIS — Z888 Allergy status to other drugs, medicaments and biological substances status: Secondary | ICD-10-CM

## 2023-03-01 DIAGNOSIS — I6782 Cerebral ischemia: Secondary | ICD-10-CM | POA: Diagnosis not present

## 2023-03-01 DIAGNOSIS — Z931 Gastrostomy status: Secondary | ICD-10-CM

## 2023-03-01 DIAGNOSIS — Z79899 Other long term (current) drug therapy: Secondary | ICD-10-CM

## 2023-03-01 DIAGNOSIS — D649 Anemia, unspecified: Secondary | ICD-10-CM

## 2023-03-01 DIAGNOSIS — N320 Bladder-neck obstruction: Secondary | ICD-10-CM | POA: Diagnosis present

## 2023-03-01 DIAGNOSIS — Z7984 Long term (current) use of oral hypoglycemic drugs: Secondary | ICD-10-CM

## 2023-03-01 DIAGNOSIS — R29810 Facial weakness: Secondary | ICD-10-CM | POA: Diagnosis not present

## 2023-03-01 DIAGNOSIS — R059 Cough, unspecified: Secondary | ICD-10-CM | POA: Diagnosis not present

## 2023-03-01 DIAGNOSIS — D631 Anemia in chronic kidney disease: Secondary | ICD-10-CM | POA: Diagnosis not present

## 2023-03-01 DIAGNOSIS — R195 Other fecal abnormalities: Secondary | ICD-10-CM | POA: Diagnosis present

## 2023-03-01 DIAGNOSIS — Z91014 Allergy to mammalian meats: Secondary | ICD-10-CM

## 2023-03-01 DIAGNOSIS — I10 Essential (primary) hypertension: Secondary | ICD-10-CM | POA: Diagnosis not present

## 2023-03-01 DIAGNOSIS — Z8679 Personal history of other diseases of the circulatory system: Secondary | ICD-10-CM

## 2023-03-01 DIAGNOSIS — Z833 Family history of diabetes mellitus: Secondary | ICD-10-CM

## 2023-03-01 DIAGNOSIS — G9341 Metabolic encephalopathy: Secondary | ICD-10-CM | POA: Diagnosis present

## 2023-03-01 DIAGNOSIS — Z87891 Personal history of nicotine dependence: Secondary | ICD-10-CM | POA: Diagnosis not present

## 2023-03-01 DIAGNOSIS — K219 Gastro-esophageal reflux disease without esophagitis: Secondary | ICD-10-CM | POA: Diagnosis present

## 2023-03-01 DIAGNOSIS — I129 Hypertensive chronic kidney disease with stage 1 through stage 4 chronic kidney disease, or unspecified chronic kidney disease: Secondary | ICD-10-CM | POA: Diagnosis present

## 2023-03-01 DIAGNOSIS — E785 Hyperlipidemia, unspecified: Secondary | ICD-10-CM | POA: Diagnosis present

## 2023-03-01 DIAGNOSIS — R0689 Other abnormalities of breathing: Secondary | ICD-10-CM | POA: Diagnosis not present

## 2023-03-01 DIAGNOSIS — R404 Transient alteration of awareness: Secondary | ICD-10-CM | POA: Diagnosis not present

## 2023-03-01 DIAGNOSIS — Z803 Family history of malignant neoplasm of breast: Secondary | ICD-10-CM

## 2023-03-01 DIAGNOSIS — Z823 Family history of stroke: Secondary | ICD-10-CM

## 2023-03-01 DIAGNOSIS — R569 Unspecified convulsions: Secondary | ICD-10-CM

## 2023-03-01 DIAGNOSIS — I672 Cerebral atherosclerosis: Secondary | ICD-10-CM | POA: Diagnosis not present

## 2023-03-01 DIAGNOSIS — R5383 Other fatigue: Principal | ICD-10-CM

## 2023-03-01 DIAGNOSIS — Z8249 Family history of ischemic heart disease and other diseases of the circulatory system: Secondary | ICD-10-CM

## 2023-03-01 DIAGNOSIS — G40909 Epilepsy, unspecified, not intractable, without status epilepticus: Secondary | ICD-10-CM | POA: Diagnosis present

## 2023-03-01 DIAGNOSIS — N390 Urinary tract infection, site not specified: Secondary | ICD-10-CM | POA: Diagnosis not present

## 2023-03-01 DIAGNOSIS — Z1152 Encounter for screening for COVID-19: Secondary | ICD-10-CM | POA: Diagnosis not present

## 2023-03-01 DIAGNOSIS — I6381 Other cerebral infarction due to occlusion or stenosis of small artery: Secondary | ICD-10-CM | POA: Diagnosis not present

## 2023-03-01 DIAGNOSIS — E119 Type 2 diabetes mellitus without complications: Secondary | ICD-10-CM

## 2023-03-01 DIAGNOSIS — R9389 Abnormal findings on diagnostic imaging of other specified body structures: Secondary | ICD-10-CM | POA: Diagnosis not present

## 2023-03-01 DIAGNOSIS — R531 Weakness: Secondary | ICD-10-CM | POA: Diagnosis not present

## 2023-03-01 HISTORY — DX: Epilepsy, unspecified, not intractable, without status epilepticus: G40.909

## 2023-03-01 LAB — CBC WITH DIFFERENTIAL/PLATELET
Abs Immature Granulocytes: 0.04 10*3/uL (ref 0.00–0.07)
Basophils Absolute: 0 10*3/uL (ref 0.0–0.1)
Basophils Relative: 0 %
Eosinophils Absolute: 0 10*3/uL (ref 0.0–0.5)
Eosinophils Relative: 0 %
HCT: 23.9 % — ABNORMAL LOW (ref 36.0–46.0)
Hemoglobin: 7.7 g/dL — ABNORMAL LOW (ref 12.0–15.0)
Immature Granulocytes: 0 %
Lymphocytes Relative: 8 %
Lymphs Abs: 0.8 10*3/uL (ref 0.7–4.0)
MCH: 32.2 pg (ref 26.0–34.0)
MCHC: 32.2 g/dL (ref 30.0–36.0)
MCV: 100 fL (ref 80.0–100.0)
Monocytes Absolute: 0.9 10*3/uL (ref 0.1–1.0)
Monocytes Relative: 8 %
Neutro Abs: 9.2 10*3/uL — ABNORMAL HIGH (ref 1.7–7.7)
Neutrophils Relative %: 84 %
Platelets: 385 10*3/uL (ref 150–400)
RBC: 2.39 MIL/uL — ABNORMAL LOW (ref 3.87–5.11)
RDW: 17 % — ABNORMAL HIGH (ref 11.5–15.5)
WBC: 11 10*3/uL — ABNORMAL HIGH (ref 4.0–10.5)
nRBC: 0 % (ref 0.0–0.2)

## 2023-03-01 LAB — COMPREHENSIVE METABOLIC PANEL
ALT: 88 U/L — ABNORMAL HIGH (ref 0–44)
AST: 76 U/L — ABNORMAL HIGH (ref 15–41)
Albumin: 2.4 g/dL — ABNORMAL LOW (ref 3.5–5.0)
Alkaline Phosphatase: 98 U/L (ref 38–126)
Anion gap: 10 (ref 5–15)
BUN: 27 mg/dL — ABNORMAL HIGH (ref 8–23)
CO2: 31 mmol/L (ref 22–32)
Calcium: 8.8 mg/dL — ABNORMAL LOW (ref 8.9–10.3)
Chloride: 93 mmol/L — ABNORMAL LOW (ref 98–111)
Creatinine, Ser: 0.9 mg/dL (ref 0.44–1.00)
GFR, Estimated: >60 mL/min (ref >60)
Glucose, Bld: 122 mg/dL — ABNORMAL HIGH (ref 70–99)
Potassium: 4.2 mmol/L (ref 3.5–5.1)
Sodium: 134 mmol/L — ABNORMAL LOW (ref 135–145)
Total Bilirubin: 0.5 mg/dL (ref 0.3–1.2)
Total Protein: 6.4 g/dL — ABNORMAL LOW (ref 6.5–8.1)

## 2023-03-01 LAB — URINALYSIS, W/ REFLEX TO CULTURE (INFECTION SUSPECTED)
Bilirubin Urine: NEGATIVE
Glucose, UA: NEGATIVE mg/dL
Hgb urine dipstick: NEGATIVE
Ketones, ur: NEGATIVE mg/dL
Nitrite: NEGATIVE
Protein, ur: 100 mg/dL — AB
Specific Gravity, Urine: 1.021 (ref 1.005–1.030)
WBC, UA: >50 WBC/hpf (ref 0–5)
pH: 5 (ref 5.0–8.0)

## 2023-03-01 LAB — CBG MONITORING, ED: Glucose-Capillary: 104 mg/dL — ABNORMAL HIGH (ref 70–99)

## 2023-03-01 LAB — I-STAT CG4 LACTIC ACID, ED
Lactic Acid, Venous: 1.4 mmol/L (ref 0.5–1.9)
Lactic Acid, Venous: 1.9 mmol/L (ref 0.5–1.9)

## 2023-03-01 LAB — TROPONIN I (HIGH SENSITIVITY)
Troponin I (High Sensitivity): 5 ng/L (ref <18)
Troponin I (High Sensitivity): 5 ng/L (ref <18)

## 2023-03-01 LAB — POC OCCULT BLOOD, ED: Fecal Occult Bld: POSITIVE — AB

## 2023-03-01 LAB — SARS CORONAVIRUS 2 BY RT PCR: SARS Coronavirus 2 by RT PCR: NEGATIVE

## 2023-03-01 MED ORDER — SODIUM CHLORIDE 0.9 % IV SOLN: INTRAVENOUS | Status: AC

## 2023-03-01 MED ORDER — INSULIN ASPART 100 UNIT/ML IJ SOLN
0.0000 [IU] | INTRAMUSCULAR | Status: DC
  Administered 2023-03-02: 1 [IU] via SUBCUTANEOUS
  Administered 2023-03-02 – 2023-03-03 (×3): 2 [IU] via SUBCUTANEOUS
  Administered 2023-03-03 (×3): 1 [IU] via SUBCUTANEOUS
  Administered 2023-03-04: 2 [IU] via SUBCUTANEOUS
  Administered 2023-03-04: 1 [IU] via SUBCUTANEOUS

## 2023-03-01 NOTE — H&P (Signed)
History and Physical    Gina Kelley FAO:[id number] DOB: Feb 27, 1936 DOA: 03/01/2023  PCP: Patient, No Pcp Per  Patient coming from: [name]  Chief Complaint: AMS  HPI: Gina Kelley is a 87 y.o. female with medical history significant of ICH/SDH s/p craniotomy earlier this year with resultant left-sided hemiparesis/dysphagia requiring PEG tube and resident of [name], seizure disorder, hypertension, hyperlipidemia, type 2 diabetes, mood disorder, bladder outlet obstruction requiring chronic indwelling Foley catheter, GERD/PUD.  Recently admitted 8/16-8/21 for sepsis secondary to aspiration pneumonia, acute metabolic encephalopathy, and AKI.  There was extensive discussion with the family about goals of care and family elected to continue full scope of treatment/full code.  Palliative care was consulted.  Patient presents to the ED today from [name] for evaluation of altered mental status.  Daughter went to check on the patient today around 1 or 2 PM when she was found altered, very lethargic, with some drooping of her face.  LKW yesterday evening per family.  She was hypotensive with EMS with systolic 80 and was given IV fluids.  SBP 100-110s in the ED.  Afebrile and not tachycardic.  Labs notable for WBC 11.0, hemoglobin 7.7 (baseline mid 8-10 range), MCV 100.0, sodium 134 (slightly low on recent labs as well), chloride 93, glucose 122, BUN 27, creatinine 0.9, albumin 2.4, AST 76, ALT 88, alk phos and T. bili normal, troponin negative x 2, blood cultures collected, UA pending, SARS-CoV-2 PCR negative, lactic acid normal x 2, FOBT pending.  Chest x-ray showing no active cardiopulmonary disease.  CT head negative for acute intracranial abnormality.  Patient is somnolent but arousable.  Very lethargic.  Oriented to self only.  Noted to have right-sided facial droop with slurred speech.  Not able to give any history.  Daughter Gina Kelley present at bedside states she saw the patient yesterday evening  around 7 PM at her nursing home and at that time she appeared normal.  Today when daughter went back to visit her around 2 PM, it was noted that she had right-sided facial droop and was very lethargic and altered.  Daughter states normally patient is awake, alert, oriented, and is able to have a conversation with family members and recognizes them.  Daughter states she lives in [place] and is currently visiting the patient here in [place] but does have a sister who lives here in town and checks on the patient frequently.  Review of Systems:  Review of Systems  Reason unable to perform ROS: AMS.    Past Medical History:  Diagnosis Date   Anemia of chronic disease 07/08/2006   Antral ulcer 02/23/2007   Seen on EGD in 2008, small ulcer with erosion    Cerebral vascular accident (HCC) 04/14/2006   1998, left lower extremity numbness, no residual deficits    Essential hypertension 04/14/2006   Gastroesophageal reflux disease 02/18/2013   Occasional, symptomatically relieved with peptobismol    Healthcare maintenance 12/04/2011   Hyperlipidemia LDL goal < 100 04/14/2006   Hypertensive retinopathy of both eyes, grade 1 05/15/2016   Osteopenia of right femoral neck 10/20/2016   DEXA (10/16/2016): R femur T -2.5 (FRAX tool calculates at -2.4), L1-L4 spine T -0.9, 10 year risk for: Major osteoporotic fracture 8.3%, Hip fracture 2.7%   Type 2 diabetes mellitus with stage 1 chronic kidney disease, without long-term current use of insulin (HCC)    Type II diabetes mellitus (HCC) 04/14/2006    Past Surgical History:  Procedure Laterality Date   COLONOSCOPY  CRANIOTOMY Right 07/08/2022   Procedure: RIGHT CRANIOTOMY HEMATOMA EVACUATION SUBDURAL;  Surgeon: Julio Sicks, MD;  Location: [facility] OR;  Service: Neurosurgery;  Laterality: Right;   ESOPHAGOGASTRODUODENOSCOPY     ESOPHAGOGASTRODUODENOSCOPY (EGD) WITH PROPOFOL N/A 08/01/2022   Procedure: ESOPHAGOGASTRODUODENOSCOPY (EGD) WITH PROPOFOL;  Surgeon:  Diamantina Monks, MD;  Location: [facility] ENDOSCOPY;  Service: General;  Laterality: N/A;   PEG PLACEMENT N/A 08/01/2022   Procedure: PERCUTANEOUS ENDOSCOPIC GASTROSTOMY (PEG) PLACEMENT;  Surgeon: Diamantina Monks, MD;  Location: [facility] ENDOSCOPY;  Service: General;  Laterality: N/A;     reports that she quit smoking about 44 years ago. Her smoking use included cigarettes. She has never used smokeless tobacco. She reports that she does not drink alcohol and does not use drugs.  Allergies  Allergen Reactions   Cozaar [Losartan] Swelling    Angioedema    Periogard [Chlorhexidine Gluconate] Swelling and Other (See Comments)    Skin irritation Eye/tongue/lip swelling   Pork Allergy Other (See Comments)    Unknown reaction   Vasotec [Enalapril] Cough    Family History  Problem Relation Age of Onset   Stroke Mother    Diabetes Mother    Hypertension Mother    Anuerysm Father 23       Cerebral   Asthma Sister    Breast cancer Sister    Pulmonary disease Brother        Black lung   Stroke Sister    Cirrhosis Brother    Hypertension Son    Asthma Brother     Prior to Admission medications   Medication Sig Start Date End Date Taking? Authorizing Provider  acetaminophen (TYLENOL) 500 MG tablet Place 500-1,000 mg into feeding tube See admin instructions. 1000 mg via G-tube twice daily and 500 mg four times daily as needed for pain    [provider]  amLODipine (NORVASC) 10 MG tablet Place 1 tablet (10 mg total) into feeding tube daily. 08/23/22   Setzer, Lynnell Jude, PA-C  ascorbic acid (VITAMIN C) 500 MG tablet Place 1 tablet (500 mg total) into feeding tube daily. 08/23/22   Setzer, Lynnell Jude, PA-C  atorvastatin (LIPITOR) 80 MG tablet Place 1 tablet (80 mg total) into feeding tube daily. 08/23/22   Setzer, Lynnell Jude, PA-C  carvedilol (COREG) 6.25 MG tablet Place 1 tablet (6.25 mg total) into feeding tube 2 (two) times daily with a meal. 08/22/22   Setzer, Lynnell Jude, PA-C  diclofenac Sodium  (VOLTAREN) 1 % GEL Apply 2 g topically 4 (four) times daily. 08/22/22   Setzer, Lynnell Jude, PA-C  ezetimibe (ZETIA) 10 MG tablet Place 1 tablet (10 mg total) into feeding tube daily. Patient taking differently: Place 10 mg into feeding tube at bedtime. 08/23/22   Setzer, Lynnell Jude, PA-C  famotidine (PEPCID) 20 MG tablet Place 1 tablet (20 mg total) into feeding tube 2 (two) times daily. 08/22/22   Setzer, Lynnell Jude, PA-C  ferrous sulfate 300 (60 Fe) MG/5ML syrup Place 5 mLs (300 mg total) into feeding tube daily with breakfast. Patient taking differently: Place 408 mg into feeding tube daily. 08/23/22   Setzer, Lynnell Jude, PA-C  FLUoxetine (PROZAC) 20 MG capsule Place 1 capsule (20 mg total) into feeding tube at bedtime. 08/22/22   Setzer, Lynnell Jude, PA-C  gabapentin (NEURONTIN) 250 MG/5ML solution Place 2 mLs (100 mg total) into feeding tube every 8 (eight) hours. 08/22/22   Setzer, Lynnell Jude, PA-C  guaiFENesin (ROBITUSSIN) 100 MG/5ML liquid Place 200 mg into feeding tube 4 (  four) times daily.    [provider]  insulin lispro (HUMALOG) 100 UNIT/ML KwikPen Inject 0-15 Units into the skin See admin instructions. Inject 0-15 units 4 times daily per sliding scale: < 69 : implement hypoglycemic protocol 70-150 : 0 units 151-200 : 3 units 201-250 : 6 units 251-300 : 9 units 301-350 : 12 units 351-400 : 16 units > 401 : call MD    [provider]  ipratropium-albuterol (DUONEB) 0.5-2.5 (3) MG/3ML SOLN Take 3 mLs by nebulization every 4 (four) hours as needed. 02/18/23   Ghimire, Werner Lean, MD  l-methylfolate-B6-B12 (METANX) 3-35-2 MG TABS tablet Place 1 tablet into feeding tube daily. 08/23/22   Setzer, Lynnell Jude, PA-C  levETIRAcetam (KEPPRA) 100 MG/ML solution Place 5 mLs (500 mg total) into feeding tube 2 (two) times daily. 01/22/23   Micki Riley, MD  lidocaine 4 % Place 1 patch onto the skin daily.    [provider]  metFORMIN (GLUCOPHAGE) 500 MG tablet Place 1 tablet (500 mg  total) into feeding tube 2 (two) times daily with a meal. Patient taking differently: Place 750 mg into feeding tube 2 (two) times daily. 08/22/22   Setzer, Lynnell Jude, PA-C  Multiple Vitamins-Minerals (PRESERVISION AREDS 2) CHEW Give 1 each by tube 2 (two) times daily.    [provider]  Nutritional Supplements (FEEDING SUPPLEMENT, GLUCERNA 1.5 CAL,) LIQD Place 1,000 mLs into feeding tube daily. 02/18/23   Ghimire, Werner Lean, MD  ondansetron (ZOFRAN) 4 MG tablet Take 4 mg by mouth every 8 (eight) hours as needed for nausea or vomiting.    [provider]  Water For Irrigation, Sterile (FREE WATER) SOLN Place 120 mLs into feeding tube every 4 (four) hours. 02/18/23   Maretta Bees, MD    Physical Exam: Vitals:   03/01/23 1730 03/01/23 1744 03/01/23 1845  BP: 112/62  (!) 108/59  Pulse: 72  65  Resp: 17  15  Temp:  98.6 F (37 C)   TempSrc:  Oral   SpO2: 93%  100%    Physical Exam Vitals reviewed.  HENT:     Head: Normocephalic and atraumatic.  Eyes:     Extraocular Movements: Extraocular movements intact.  Cardiovascular:     Rate and Rhythm: Normal rate and regular rhythm.     Pulses: Normal pulses.  Pulmonary:     Effort: Pulmonary effort is normal. No respiratory distress.     Breath sounds: No wheezing or rales.  Abdominal:     General: Bowel sounds are normal. There is no distension.     Palpations: Abdomen is soft.     Tenderness: There is no abdominal tenderness.  Musculoskeletal:     Cervical back: Normal range of motion.     Right lower leg: No edema.     Left lower leg: No edema.  Skin:    General: Skin is warm and dry.  Neurological:     Mental Status: She is alert.     Comments: Somnolent but arousable, very lethargic Right-sided facial droop, slurred speech Unable to move left upper and lower extremity (chronic per family) She is able to squeeze my fingers with her right hand and grip strength is strong.  She is able to move her right  lower extremity on command.      Labs on Admission: I have personally reviewed following labs and imaging studies  CBC: Recent Labs  Lab 03/01/23 1800  WBC 11.0*  NEUTROABS 9.2*  HGB 7.7*  HCT 23.9*  MCV 100.0  PLT 385   Basic Metabolic Panel: Recent Labs  Lab 03/01/23 1800  NA 134*  K 4.2  CL 93*  CO2 31  GLUCOSE 122*  BUN 27*  CREATININE 0.90  CALCIUM 8.8*   GFR: Estimated Creatinine Clearance: 37.8 mL/min (by C-G formula based on SCr of 0.9 mg/dL). Liver Function Tests: Recent Labs  Lab 03/01/23 1800  AST 76*  ALT 88*  ALKPHOS 98  BILITOT 0.5  PROT 6.4*  ALBUMIN 2.4*   No results for input(s): "LIPASE", "AMYLASE" in the last 168 hours. No results for input(s): "AMMONIA" in the last 168 hours. Coagulation Profile: No results for input(s): "INR", "PROTIME" in the last 168 hours. Cardiac Enzymes: No results for input(s): "CKTOTAL", "CKMB", "CKMBINDEX", "TROPONINI" in the last 168 hours. BNP (last 3 results) No results for input(s): "PROBNP" in the last 8760 hours. HbA1C: No results for input(s): "HGBA1C" in the last 72 hours. CBG: Recent Labs  Lab 03/01/23 1731  GLUCAP 104*   Lipid Profile: No results for input(s): "CHOL", "HDL", "LDLCALC", "TRIG", "CHOLHDL", "LDLDIRECT" in the last 72 hours. Thyroid Function Tests: No results for input(s): "TSH", "T4TOTAL", "FREET4", "T3FREE", "THYROIDAB" in the last 72 hours. Anemia Panel: No results for input(s): "VITAMINB12", "FOLATE", "FERRITIN", "TIBC", "IRON", "RETICCTPCT" in the last 72 hours. Urine analysis:    Component Value Date/Time   COLORURINE YELLOW 02/13/2023 1750   APPEARANCEUR CLOUDY (A) 02/13/2023 1750   LABSPEC 1.018 02/13/2023 1750   PHURINE 5.0 02/13/2023 1750   GLUCOSEU NEGATIVE 02/13/2023 1750   HGBUR NEGATIVE 02/13/2023 1750   BILIRUBINUR NEGATIVE 02/13/2023 1750   KETONESUR NEGATIVE 02/13/2023 1750   PROTEINUR 100 (A) 02/13/2023 1750   NITRITE NEGATIVE 02/13/2023 1750    LEUKOCYTESUR LARGE (A) 02/13/2023 1750    Radiological Exams on Admission: CT Head Wo Contrast  Result Date: 03/01/2023 CLINICAL DATA:  Mental status change, unknown cause. Low blood pressures. EXAM: CT HEAD WITHOUT CONTRAST TECHNIQUE: Contiguous axial images were obtained from the base of the skull through the vertex without intravenous contrast. RADIATION DOSE REDUCTION: This exam was performed according to the departmental dose-optimization program which includes automated exposure control, adjustment of the mA and/or kV according to patient size and/or use of iterative reconstruction technique. COMPARISON:  CT head 02/13/2023 FINDINGS: Brain: No intracranial hemorrhage, mass effect, or evidence of acute infarct. No hydrocephalus. No extra-axial fluid collection. Age-commensurate cerebral atrophy and ill-defined hypoattenuation within the cerebral white matter consistent with chronic small vessel ischemic disease. Unchanged right frontal encephalomalacia. Remote left basal ganglia infarct. Vascular: No hyperdense vessel. Intracranial arterial calcification. Skull: No fracture or focal lesion.  Right craniotomy. Sinuses/Orbits: No acute finding. Paranasal sinuses and mastoid air cells are well aerated. Other: None. IMPRESSION: 1. No acute intracranial abnormality. 2. Chronic small vessel ischemic disease and remote infarcts as described. Electronically Signed   By: Minerva Fester M.D.   On: 03/01/2023 20:24   DG Chest Port 1 View  Result Date: 03/01/2023 CLINICAL DATA:  Cough, weakness EXAM: PORTABLE CHEST 1 VIEW COMPARISON:  Chest radiograph dated 02/13/2023. FINDINGS: The heart size and mediastinal contours are within normal limits. Vascular calcifications are seen in the aortic arch. The right hemidiaphragm is elevated relative to the left. Both lungs are clear. Degenerative changes are seen in the spine. IMPRESSION: No active cardiopulmonary disease. Electronically Signed   By: Romona Curls M.D.    On: 03/01/2023 19:13    EKG: Independently reviewed.  Sinus rhythm with first-degree AV block, no significant  change compared to previous EKGs.  Assessment and Plan  Altered mental status Acute neurologic deficits She does have right-sided facial droop and slurred speech on exam, LKW per daughter around 7 PM yesterday, 8/31.  CT head negative for acute intracranial abnormality.  No fever or meningeal signs.  Stat brain MRI ordered to rule out stroke.  UA pending to complete metabolic workup.  Will also check TSH, B12, and ammonia levels.  May need EEG if MRI and metabolic workup negative.  Hypotension Reportedly systolic 80 with EMS and was given IV fluids.  Blood pressure has now improved.  She has borderline leukocytosis on labs but no fever or signs of sepsis.  Lactic acid normal x 2.  No obvious infectious source identified so far, UA pending.  Troponin negative x 2 and not consistent with ACS.  Hemoglobin 7.7 and baseline appears to be in the mid 8-10 range.  No obvious bleeding, FOBT pending.  Give gentle IV fluid hydration and hold home antihypertensives at this time.  Acute on chronic anemia No obvious bleeding, FOBT pending.  Type and screen.  Mild hyponatremia Gentle IV fluid hydration and monitor labs.  Mild transaminitis Possibly related to hypotension which has now resolved.  Alk phos and T. bili normal.  No abdominal pain/tenderness or nausea/vomiting.  Hold home statin and repeat LFTs in the morning.  History of ICH/SDH s/p craniotomy earlier this year with resultant left-sided hemiparesis/dysphagia requiring PEG tube Keep n.p.o. and continue tube feeds after pharmacy med rec is done.  Seizure disorder Continue Keppra after pharmacy med rec is done.  Type 2 diabetes Well-controlled-recent A1c 5.8 on 02/14/2023.  Sensitive sliding scale insulin every 4 hours.  History of bladder outlet obstruction requiring Foley catheter in the past Patient arrived from her facility  without a Foley catheter.  Continue bladder scans every 6 hours.  DVT prophylaxis: SCDs Code Status: Unable to discuss CODE STATUS with the patient due to her altered mental status.  Discussed CODE STATUS with her daughter who informed me that family has had multiple discussions with palliative care and they want the patient to remain FULL CODE/full scope of treatment. Family Communication: Daughter Kamilly Heiserman at bedside. Level of care: Progressive Care Unit Admission status: It is my clinical opinion that admission to INPATIENT is reasonable and necessary because of the expectation that this patient will require hospital care that crosses at least 2 midnights to treat this condition based on the medical complexity of the problems presented.  Given the aforementioned information, the predictability of an adverse outcome is felt to be significant.  John Giovanni MD [facility]  If 7PM-7AM, please contact night-coverage [email or web address]  03/01/2023, 9:32 PM

## 2023-03-01 NOTE — ED Provider Notes (Signed)
[place] EMERGENCY DEPARTMENT AT [facility] Provider Note   CSN: [id number] Arrival date & time: 03/01/23  1718     History {Add pertinent medical, surgical, social history, OB history to HPI:1} Chief Complaint  Patient presents with   Altered Mental Status    Gina Kelley is a 87 y.o. female.  Level 5 caveat secondary to altered mental status.  She has a prior history of stroke which left her with aphasia and some left-sided deficits.  She is currently in a nursing home.  Daughter is primarily giving history.  She said she saw her last evening and she was awake alert and conversant.  She was recently in the hospital for an aspiration pneumonia sepsis AKI.  She is currently on tube feeds.  Daughter states she went to check on her today around 1 or 2 PM and found her altered very lethargic with some drooping of her face.  Staff had not noticed any problems with her during the day although nobody documented that she was completely normal.  Last known well were going with his last evening with the daughter last saw her.  Patient is arousable to voice denies headache chest pain trouble breathing abdominal pain.  She is a limited historian.  The history is provided by the patient and a relative.  Altered Mental Status Presenting symptoms: lethargy   Most recent episode:  Today Timing:  Constant Progression:  Unchanged Chronicity:  New Context: nursing home resident, recent illness and recent infection   Associated symptoms: no abdominal pain, no difficulty breathing and no headaches        Home Medications Prior to Admission medications   Medication Sig Start Date End Date Taking? Authorizing Provider  acetaminophen (TYLENOL) 500 MG tablet Place 500-1,000 mg into feeding tube See admin instructions. 1000 mg via G-tube twice daily and 500 mg four times daily as needed for pain    [provider]  amLODipine (NORVASC) 10 MG tablet Place 1 tablet (10 mg total) into  feeding tube daily. 08/23/22   Setzer, Lynnell Jude, PA-C  ascorbic acid (VITAMIN C) 500 MG tablet Place 1 tablet (500 mg total) into feeding tube daily. 08/23/22   Setzer, Lynnell Jude, PA-C  atorvastatin (LIPITOR) 80 MG tablet Place 1 tablet (80 mg total) into feeding tube daily. 08/23/22   Setzer, Lynnell Jude, PA-C  carvedilol (COREG) 6.25 MG tablet Place 1 tablet (6.25 mg total) into feeding tube 2 (two) times daily with a meal. 08/22/22   Setzer, Lynnell Jude, PA-C  diclofenac Sodium (VOLTAREN) 1 % GEL Apply 2 g topically 4 (four) times daily. 08/22/22   Setzer, Lynnell Jude, PA-C  ezetimibe (ZETIA) 10 MG tablet Place 1 tablet (10 mg total) into feeding tube daily. Patient taking differently: Place 10 mg into feeding tube at bedtime. 08/23/22   Setzer, Lynnell Jude, PA-C  famotidine (PEPCID) 20 MG tablet Place 1 tablet (20 mg total) into feeding tube 2 (two) times daily. 08/22/22   Setzer, Lynnell Jude, PA-C  ferrous sulfate 300 (60 Fe) MG/5ML syrup Place 5 mLs (300 mg total) into feeding tube daily with breakfast. Patient taking differently: Place 408 mg into feeding tube daily. 08/23/22   Setzer, Lynnell Jude, PA-C  FLUoxetine (PROZAC) 20 MG capsule Place 1 capsule (20 mg total) into feeding tube at bedtime. 08/22/22   Setzer, Lynnell Jude, PA-C  gabapentin (NEURONTIN) 250 MG/5ML solution Place 2 mLs (100 mg total) into feeding tube every 8 (eight) hours. 08/22/22   Setzer,  Lynnell Jude, PA-C  guaiFENesin (ROBITUSSIN) 100 MG/5ML liquid Place 200 mg into feeding tube 4 (four) times daily.    [provider]  insulin lispro (HUMALOG) 100 UNIT/ML KwikPen Inject 0-15 Units into the skin See admin instructions. Inject 0-15 units 4 times daily per sliding scale: < 69 : implement hypoglycemic protocol 70-150 : 0 units 151-200 : 3 units 201-250 : 6 units 251-300 : 9 units 301-350 : 12 units 351-400 : 16 units > 401 : call MD    [provider]  ipratropium-albuterol (DUONEB) 0.5-2.5 (3) MG/3ML SOLN Take 3 mLs by nebulization  every 4 (four) hours as needed. 02/18/23   Ghimire, Werner Lean, MD  l-methylfolate-B6-B12 (METANX) 3-35-2 MG TABS tablet Place 1 tablet into feeding tube daily. 08/23/22   Setzer, Lynnell Jude, PA-C  levETIRAcetam (KEPPRA) 100 MG/ML solution Place 5 mLs (500 mg total) into feeding tube 2 (two) times daily. 01/22/23   Micki Riley, MD  lidocaine 4 % Place 1 patch onto the skin daily.    [provider]  metFORMIN (GLUCOPHAGE) 500 MG tablet Place 1 tablet (500 mg total) into feeding tube 2 (two) times daily with a meal. Patient taking differently: Place 750 mg into feeding tube 2 (two) times daily. 08/22/22   Setzer, Lynnell Jude, PA-C  Multiple Vitamins-Minerals (PRESERVISION AREDS 2) CHEW Give 1 each by tube 2 (two) times daily.    [provider]  Nutritional Supplements (FEEDING SUPPLEMENT, GLUCERNA 1.5 CAL,) LIQD Place 1,000 mLs into feeding tube daily. 02/18/23   Ghimire, Werner Lean, MD  ondansetron (ZOFRAN) 4 MG tablet Take 4 mg by mouth every 8 (eight) hours as needed for nausea or vomiting.    [provider]  Water For Irrigation, Sterile (FREE WATER) SOLN Place 120 mLs into feeding tube every 4 (four) hours. 02/18/23   Ghimire, Werner Lean, MD      Allergies    Cozaar [losartan], Periogard [chlorhexidine gluconate], Pork allergy, and Vasotec [enalapril]    Review of Systems   Review of Systems  Unable to perform ROS: Mental status change  Gastrointestinal:  Negative for abdominal pain.  Neurological:  Negative for headaches.    Physical Exam Updated Vital Signs BP 112/62   Pulse 72   Temp 98.6 F (37 C) (Oral)   Resp 17   SpO2 93%  Physical Exam Vitals and nursing note reviewed.  Constitutional:      General: She is not in acute distress.    Appearance: Normal appearance. She is well-developed.  HENT:     Head: Normocephalic and atraumatic.  Eyes:     Conjunctiva/sclera: Conjunctivae normal.  Cardiovascular:     Rate and Rhythm: Normal rate and regular  rhythm.     Heart sounds: No murmur heard. Pulmonary:     Effort: Pulmonary effort is normal. No respiratory distress.     Breath sounds: Rhonchi present.  Abdominal:     Palpations: Abdomen is soft.     Tenderness: There is no abdominal tenderness. There is no guarding or rebound.     Comments: Peg Tube in place  Musculoskeletal:        General: No deformity.     Cervical back: Neck supple.  Skin:    General: Skin is warm and dry.     Capillary Refill: Capillary refill takes less than 2 seconds.  Neurological:     Mental Status: She is lethargic.     Motor: Weakness present.     Comments: She is  arousable to voice and will answer simple questions.  She is not oriented to time or place or situation.  She has good use of her right arm.  She has no use of her left arm and minimal use of her legs although right seems stronger than left.  She is following commands.  She does appear to have some right facial droop.     ED Results / Procedures / Treatments   Labs (all labs ordered are listed, but only abnormal results are displayed) Labs Reviewed  CBG MONITORING, ED - Abnormal; Notable for the following components:      Result Value   Glucose-Capillary 104 (*)    All other components within normal limits  CULTURE, BLOOD (ROUTINE X 2)  CULTURE, BLOOD (ROUTINE X 2)  SARS CORONAVIRUS 2 BY RT PCR  COMPREHENSIVE METABOLIC PANEL  CBC WITH DIFFERENTIAL/PLATELET  URINALYSIS, W/ REFLEX TO CULTURE (INFECTION SUSPECTED)  I-STAT CG4 LACTIC ACID, ED  TROPONIN I (HIGH SENSITIVITY)    EKG None  Radiology No results found.  Procedures Procedures  {Document cardiac monitor, telemetry assessment procedure when appropriate:1}  Medications Ordered in ED Medications - No data to display  ED Course/ Medical Decision Making/ A&P   {   Click here for ABCD2, HEART and other calculatorsREFRESH Note before signing :1}                              Medical Decision Making Amount and/or  Complexity of Data Reviewed Labs: ordered. Radiology: ordered.   This patient complains of ***; this involves an extensive number of treatment Options and is a complaint that carries with it a high risk of complications and morbidity. The differential includes ***  I ordered, reviewed and interpreted labs, which included *** I ordered medication *** and reviewed PMP when indicated. I ordered imaging studies which included *** and I independently    visualized and interpreted imaging which showed *** Additional history obtained from *** Previous records obtained and reviewed *** I consulted *** and discussed lab and imaging findings and discussed disposition.  Cardiac monitoring reviewed, *** Social determinants considered, *** Critical Interventions: ***  After the interventions stated above, I reevaluated the patient and found *** Admission and further testing considered, ***   {Document critical care time when appropriate:1} {Document review of labs and clinical decision tools ie heart score, Chads2Vasc2 etc:1}  {Document your independent review of radiology images, and any outside records:1} {Document your discussion with family members, caretakers, and with consultants:1} {Document social determinants of health affecting pt's care:1} {Document your decision making why or why not admission, treatments were needed:1} Final Clinical Impression(s) / ED Diagnoses Final diagnoses:  None    Rx / DC Orders ED Discharge Orders     None

## 2023-03-01 NOTE — ED Triage Notes (Signed)
Pt BIB EMS due to AMS. Pts initial pressure was 80 systolic with EMS. LSN 8/31 at 1900.

## 2023-03-01 NOTE — ED Notes (Addendum)
ED TO INPATIENT HANDOFF REPORT  ED Nurse Name and Phone #: Juliette Alcide RN [phone]  S Name/Age/Gender Gina Kelley 87 y.o. female Room/Bed: 032C/032C  Code Status   Code Status: Full Code  Home/[name]/Other Skilled nursing facility Patient oriented to: self Is this baseline? No   Triage Complete: Triage complete  Chief Complaint AMS (altered mental status) [R41.82]  Triage Note Pt BIB EMS due to AMS. Pts initial pressure was 80 systolic with EMS. LSN 8/31 at 1900.    Allergies Allergies  Allergen Reactions   Cozaar [Losartan] Swelling    Angioedema    Periogard [Chlorhexidine Gluconate] Swelling and Other (See Comments)    Skin irritation Eye/tongue/lip swelling   Pork Allergy Other (See Comments)    Unknown reaction   Vasotec [Enalapril] Cough    Level of Care/Admitting Diagnosis ED Disposition     ED Disposition  Admit   Condition  --   Comment  Hospital Area: [facility] [[id number]]  Level of Care: Progressive [102]  Admit to Progressive based on following criteria: NEUROLOGICAL AND NEUROSURGICAL complex patients with significant risk of instability, who do not meet ICU criteria, yet require close observation or frequent assessment (< / = every 2 - 4 hours) with medical / nursing intervention.  May admit patient to [facility] or [facility] if equivalent level of care is available:: Yes  Covid Evaluation: Asymptomatic - no recent exposure (last 10 days) testing not required  Diagnosis: AMS (altered mental status) [7846962]  Admitting Physician: John Giovanni [[id number]]  Attending Physician: John Giovanni [[id number]]  Certification:: I certify this patient will need inpatient services for at least 2 midnights          B Medical/Surgery History Past Medical History:  Diagnosis Date   Anemia of chronic disease 07/08/2006   Antral ulcer 02/23/2007   Seen on EGD in 2008, small ulcer with erosion    Cerebral vascular accident (HCC)  04/14/2006   1998, left lower extremity numbness, no residual deficits    Essential hypertension 04/14/2006   Gastroesophageal reflux disease 02/18/2013   Occasional, symptomatically relieved with peptobismol    Healthcare maintenance 12/04/2011   Hyperlipidemia LDL goal < 100 04/14/2006   Hypertensive retinopathy of both eyes, grade 1 05/15/2016   Osteopenia of right femoral neck 10/20/2016   DEXA (10/16/2016): R femur T -2.5 (FRAX tool calculates at -2.4), L1-L4 spine T -0.9, 10 year risk for: Major osteoporotic fracture 8.3%, Hip fracture 2.7%   Seizure disorder (HCC) 03/01/2023   Type 2 diabetes mellitus with stage 1 chronic kidney disease, without long-term current use of insulin (HCC)    Type II diabetes mellitus (HCC) 04/14/2006   Past Surgical History:  Procedure Laterality Date   COLONOSCOPY     CRANIOTOMY Right 07/08/2022   Procedure: RIGHT CRANIOTOMY HEMATOMA EVACUATION SUBDURAL;  Surgeon: Julio Sicks, MD;  Location: [facility] OR;  Service: Neurosurgery;  Laterality: Right;   ESOPHAGOGASTRODUODENOSCOPY     ESOPHAGOGASTRODUODENOSCOPY (EGD) WITH PROPOFOL N/A 08/01/2022   Procedure: ESOPHAGOGASTRODUODENOSCOPY (EGD) WITH PROPOFOL;  Surgeon: Diamantina Monks, MD;  Location: [facility] ENDOSCOPY;  Service: General;  Laterality: N/A;   PEG PLACEMENT N/A 08/01/2022   Procedure: PERCUTANEOUS ENDOSCOPIC GASTROSTOMY (PEG) PLACEMENT;  Surgeon: Diamantina Monks, MD;  Location: [facility] ENDOSCOPY;  Service: General;  Laterality: N/A;     A IV Location/Drains/Wounds Patient Lines/Drains/Airways Status     Active Line/Drains/Airways     Name Placement date Placement time Site Days   Peripheral IV 03/01/23 18 G Anterior;Right  Forearm 03/01/23  1744  Forearm  less than 1   Peripheral IV 03/01/23 22 G Anterior;Distal;Left Forearm 03/01/23  1744  Forearm  less than 1   Gastrostomy/Enterostomy PEG-jejunostomy 24 Fr. LLQ 08/01/22  1044  LLQ  212            Intake/Output Last 24 hours No intake or output data in  the 24 hours ending 03/01/23 2354  Labs/Imaging Results for orders placed or performed during the hospital encounter of 03/01/23 (from the past 48 hour(s))  CBG monitoring, ED     Status: Abnormal   Collection Time: 03/01/23  5:31 PM  Result Value Ref Range   Glucose-Capillary 104 (H) 70 - 99 mg/dL    Comment: Glucose reference range applies only to samples taken after fasting for at least 8 hours.  Troponin I (High Sensitivity)     Status: None   Collection Time: 03/01/23  6:00 PM  Result Value Ref Range   Troponin I (High Sensitivity) 5 <18 ng/L    Comment: (NOTE) Elevated high sensitivity troponin I (hsTnI) values and significant  changes across serial measurements may suggest ACS but many other  chronic and acute conditions are known to elevate hsTnI results.  Refer to the "Links" section for chest pain algorithms and additional  guidance. Performed at [facility], [address], [place], [place] [address]   Comprehensive metabolic panel     Status: Abnormal   Collection Time: 03/01/23  6:00 PM  Result Value Ref Range   Sodium 134 (L) 135 - 145 mmol/L   Potassium 4.2 3.5 - 5.1 mmol/L   Chloride 93 (L) 98 - 111 mmol/L   CO2 31 22 - 32 mmol/L   Glucose, Bld 122 (H) 70 - 99 mg/dL    Comment: Glucose reference range applies only to samples taken after fasting for at least 8 hours.   BUN 27 (H) 8 - 23 mg/dL   Creatinine, Ser 6.04 0.44 - 1.00 mg/dL   Calcium 8.8 (L) 8.9 - 10.3 mg/dL   Total Protein 6.4 (L) 6.5 - 8.1 g/dL   Albumin 2.4 (L) 3.5 - 5.0 g/dL   AST 76 (H) 15 - 41 U/L   ALT 88 (H) 0 - 44 U/L   Alkaline Phosphatase 98 38 - 126 U/L   Total Bilirubin 0.5 0.3 - 1.2 mg/dL   GFR, Estimated >54 >09 mL/min    Comment: (NOTE) Calculated using the CKD-EPI Creatinine Equation (2021)    Anion gap 10 5 - 15    Comment: Performed at [facility], [address], [place], [place] [address]  CBC with Differential     Status: Abnormal   Collection Time: 03/01/23   6:00 PM  Result Value Ref Range   WBC 11.0 (H) 4.0 - 10.5 K/uL   RBC 2.39 (L) 3.87 - 5.11 MIL/uL   Hemoglobin 7.7 (L) 12.0 - 15.0 g/dL   HCT 47.8 (L) 29.5 - 62.1 %   MCV 100.0 80.0 - 100.0 fL   MCH 32.2 26.0 - 34.0 pg   MCHC 32.2 30.0 - 36.0 g/dL   RDW 30.8 (H) 65.7 - 84.6 %   Platelets 385 150 - 400 K/uL   nRBC 0.0 0.0 - 0.2 %   Neutrophils Relative % 84 %   Neutro Abs 9.2 (H) 1.7 - 7.7 K/uL   Lymphocytes Relative 8 %   Lymphs Abs 0.8 0.7 - 4.0 K/uL   Monocytes Relative 8 %   Monocytes  Absolute 0.9 0.1 - 1.0 K/uL   Eosinophils Relative 0 %   Eosinophils Absolute 0.0 0.0 - 0.5 K/uL   Basophils Relative 0 %   Basophils Absolute 0.0 0.0 - 0.1 K/uL   Immature Granulocytes 0 %   Abs Immature Granulocytes 0.04 0.00 - 0.07 K/uL    Comment: Performed at [facility], [address], [place], [place] [address]  SARS Coronavirus 2 by RT PCR (hospital order, performed in [facility] hospital lab) *cepheid single result test* Peripheral     Status: None   Collection Time: 03/01/23  6:04 PM   Specimen: Peripheral; Nasal Swab  Result Value Ref Range   SARS Coronavirus 2 by RT PCR NEGATIVE NEGATIVE    Comment: Performed at [facility], [address] [place], [place] [address]  I-Stat Lactic Acid     Status: None   Collection Time: 03/01/23  6:19 PM  Result Value Ref Range   Lactic Acid, Venous 1.4 0.5 - 1.9 mmol/L  Troponin I (High Sensitivity)     Status: None   Collection Time: 03/01/23  9:02 PM  Result Value Ref Range   Troponin I (High Sensitivity) 5 <18 ng/L    Comment: (NOTE) Elevated high sensitivity troponin I (hsTnI) values and significant  changes across serial measurements may suggest ACS but many other  chronic and acute conditions are known to elevate hsTnI results.  Refer to the "Links" section for chest pain algorithms and additional  guidance. Performed at [facility], [address] [place], [place] [address]   I-Stat Lactic Acid     Status:  None   Collection Time: 03/01/23  9:09 PM  Result Value Ref Range   Lactic Acid, Venous 1.9 0.5 - 1.9 mmol/L  Urinalysis, w/ Reflex to Culture (Infection Suspected) -Urine, Catheterized; In and out catheter     Status: Abnormal   Collection Time: 03/01/23 10:18 PM  Result Value Ref Range   Specimen Source URINE, CATHETERIZED    Color, Urine AMBER (A) YELLOW    Comment: BIOCHEMICALS MAY BE AFFECTED BY COLOR   APPearance CLOUDY (A) CLEAR   Specific Gravity, Urine 1.021 1.005 - 1.030   pH 5.0 5.0 - 8.0   Glucose, UA NEGATIVE NEGATIVE mg/dL   Hgb urine dipstick NEGATIVE NEGATIVE   Bilirubin Urine NEGATIVE NEGATIVE   Ketones, ur NEGATIVE NEGATIVE mg/dL   Protein, ur 272 (A) NEGATIVE mg/dL   Nitrite NEGATIVE NEGATIVE   Leukocytes,Ua LARGE (A) NEGATIVE   RBC / HPF 0-5 0 - 5 RBC/hpf   WBC, UA >50 0 - 5 WBC/hpf    Comment:        Reflex urine culture not performed if WBC <=10, OR if Squamous epithelial cells >5. If Squamous epithelial cells >5 suggest recollection.    Bacteria, UA MANY (A) NONE SEEN   Squamous Epithelial / HPF 21-50 0 - 5 /HPF   Budding Yeast PRESENT    Hyaline Casts, UA PRESENT    Ca Oxalate Crys, UA PRESENT     Comment: Performed at [facility], [address] [place], [place] [address]  POC occult blood, ED     Status: Abnormal   Collection Time: 03/01/23 10:19 PM  Result Value Ref Range   Fecal Occult Bld POSITIVE (A) NEGATIVE   CT Head Wo Contrast  Result Date: 03/01/2023 CLINICAL DATA:  Mental status change, unknown cause. Low blood pressures. EXAM: CT HEAD WITHOUT CONTRAST TECHNIQUE: Contiguous axial images were  obtained from the base of the skull through the vertex without intravenous contrast. RADIATION DOSE REDUCTION: This exam was performed according to the departmental dose-optimization program which includes automated exposure control, adjustment of the mA and/or kV according to patient size and/or use of iterative reconstruction technique.  COMPARISON:  CT head 02/13/2023 FINDINGS: Brain: No intracranial hemorrhage, mass effect, or evidence of acute infarct. No hydrocephalus. No extra-axial fluid collection. Age-commensurate cerebral atrophy and ill-defined hypoattenuation within the cerebral white matter consistent with chronic small vessel ischemic disease. Unchanged right frontal encephalomalacia. Remote left basal ganglia infarct. Vascular: No hyperdense vessel. Intracranial arterial calcification. Skull: No fracture or focal lesion.  Right craniotomy. Sinuses/Orbits: No acute finding. Paranasal sinuses and mastoid air cells are well aerated. Other: None. IMPRESSION: 1. No acute intracranial abnormality. 2. Chronic small vessel ischemic disease and remote infarcts as described. Electronically Signed   By: Minerva Fester M.D.   On: 03/01/2023 20:24   DG Chest Port 1 View  Result Date: 03/01/2023 CLINICAL DATA:  Cough, weakness EXAM: PORTABLE CHEST 1 VIEW COMPARISON:  Chest radiograph dated 02/13/2023. FINDINGS: The heart size and mediastinal contours are within normal limits. Vascular calcifications are seen in the aortic arch. The right hemidiaphragm is elevated relative to the left. Both lungs are clear. Degenerative changes are seen in the spine. IMPRESSION: No active cardiopulmonary disease. Electronically Signed   By: Romona Curls M.D.   On: 03/01/2023 19:13    Pending Labs Unresulted Labs (From admission, onward)     Start     Ordered   03/02/23 0500  CBC  Tomorrow morning,   R        03/01/23 2235   03/02/23 0500  Comprehensive metabolic panel  Tomorrow morning,   R        03/01/23 2235   03/01/23 2235  Type and screen [facility]  Once,   R       Comments: [facility]    03/01/23 2235   03/01/23 2235  TSH  Once,   R        03/01/23 2235   03/01/23 2235  Vitamin B12  Once,   R        03/01/23 2235   03/01/23 2235  Ammonia  Once,   R        03/01/23 2235   03/01/23 1803  Culture, blood  (routine x 2)  BLOOD CULTURE X 2,   R (with STAT occurrences)      03/01/23 1803            Vitals/Pain Today's Vitals   03/01/23 1742 03/01/23 1744 03/01/23 1845 03/01/23 2218  BP:   (!) 108/59   Pulse:   65   Resp:   15   Temp:  98.6 F (37 C)  99 F (37.2 C)  TempSrc:  Oral  Rectal  SpO2:   100%   PainSc: 0-No pain       Isolation Precautions No active isolations  Medications Medications  insulin aspart (novoLOG) injection 0-9 Units (has no administration in time range)  0.9 %  sodium chloride infusion (has no administration in time range)    Mobility non-ambulatory     Focused Assessments Cardiac Assessment Handoff:    No results found for: "CKTOTAL", "CKMB", "CKMBINDEX", "TROPONINI" No results found for: "DDIMER" Does the Patient currently have chest pain? No   , Neuro Assessment Handoff:  Swallow screen pass? No    NIH Stroke Scale  Dizziness  Present: No Headache Present: No Level of Consciousness (1a.)   : Alert, keenly responsive LOC Questions (1b. )   : Answers both questions correctly LOC Commands (1c. )   : Performs one task correctly Best Gaze (2. )  : Normal Visual (3. )  : No visual loss Facial Palsy (4. )    : Normal symmetrical movements Motor Arm, Left (5a. )   : Some effort against gravity Motor Arm, Right (5b. ) : Drift Motor Leg, Left (6a. )  : Some effort against gravity Motor Leg, Right (6b. ) : Drift Limb Ataxia (7. ): Present in one limb Sensory (8. )  : Normal, no sensory loss Best Language (9. )  : Severe aphasia Dysarthria (10. ): Severe dysarthria, patient's speech is so slurred as to be unintelligible in the absence of or out of proportion to any dysphasia, or is mute/anarthric Extinction/Inattention (11.)   : No Abnormality Complete NIHSS TOTAL: 12     Neuro Assessment:   Neuro Checks:      Has TPA been given? No If patient is a Neuro Trauma and patient is going to OR before floor call report to 4N Charge nurse:  [phone] or [phone]   R Recommendations: See Admitting Provider Note  Report given to:   Additional Notes: PT alert to self. Speech slurred and difficult to understand pt. Pt very lethargic. Pt has left sided deficits from previous stroke. R facial droop.

## 2023-03-02 ENCOUNTER — Inpatient Hospital Stay (HOSPITAL_COMMUNITY): Payer: Medicare HMO

## 2023-03-02 DIAGNOSIS — Z8679 Personal history of other diseases of the circulatory system: Secondary | ICD-10-CM | POA: Diagnosis not present

## 2023-03-02 DIAGNOSIS — D649 Anemia, unspecified: Secondary | ICD-10-CM

## 2023-03-02 DIAGNOSIS — R4182 Altered mental status, unspecified: Secondary | ICD-10-CM | POA: Diagnosis not present

## 2023-03-02 DIAGNOSIS — G9341 Metabolic encephalopathy: Secondary | ICD-10-CM

## 2023-03-02 DIAGNOSIS — G40909 Epilepsy, unspecified, not intractable, without status epilepticus: Secondary | ICD-10-CM

## 2023-03-02 DIAGNOSIS — R1310 Dysphagia, unspecified: Secondary | ICD-10-CM

## 2023-03-02 LAB — COMPREHENSIVE METABOLIC PANEL
ALT: 79 U/L — ABNORMAL HIGH (ref 0–44)
AST: 63 U/L — ABNORMAL HIGH (ref 15–41)
Albumin: 2.4 g/dL — ABNORMAL LOW (ref 3.5–5.0)
Alkaline Phosphatase: 73 U/L (ref 38–126)
Anion gap: 8 (ref 5–15)
BUN: 26 mg/dL — ABNORMAL HIGH (ref 8–23)
CO2: 31 mmol/L (ref 22–32)
Calcium: 9.2 mg/dL (ref 8.9–10.3)
Chloride: 96 mmol/L — ABNORMAL LOW (ref 98–111)
Creatinine, Ser: 0.78 mg/dL (ref 0.44–1.00)
GFR, Estimated: >60 mL/min (ref >60)
Glucose, Bld: 77 mg/dL (ref 70–99)
Potassium: 3.6 mmol/L (ref 3.5–5.1)
Sodium: 135 mmol/L (ref 135–145)
Total Bilirubin: 0.7 mg/dL (ref 0.3–1.2)
Total Protein: 6.6 g/dL (ref 6.5–8.1)

## 2023-03-02 LAB — GLUCOSE, CAPILLARY
Glucose-Capillary: 137 mg/dL — ABNORMAL HIGH (ref 70–99)
Glucose-Capillary: 154 mg/dL — ABNORMAL HIGH (ref 70–99)
Glucose-Capillary: 171 mg/dL — ABNORMAL HIGH (ref 70–99)
Glucose-Capillary: 77 mg/dL (ref 70–99)
Glucose-Capillary: 77 mg/dL (ref 70–99)
Glucose-Capillary: 93 mg/dL (ref 70–99)
Glucose-Capillary: 94 mg/dL (ref 70–99)

## 2023-03-02 LAB — CBC
HCT: 24 % — ABNORMAL LOW (ref 36.0–46.0)
Hemoglobin: 7.7 g/dL — ABNORMAL LOW (ref 12.0–15.0)
MCH: 31 pg (ref 26.0–34.0)
MCHC: 32.1 g/dL (ref 30.0–36.0)
MCV: 96.8 fL (ref 80.0–100.0)
Platelets: 351 10*3/uL (ref 150–400)
RBC: 2.48 MIL/uL — ABNORMAL LOW (ref 3.87–5.11)
RDW: 17.2 % — ABNORMAL HIGH (ref 11.5–15.5)
WBC: 9.7 10*3/uL (ref 4.0–10.5)
nRBC: 0 % (ref 0.0–0.2)

## 2023-03-02 LAB — TSH: TSH: 0.456 u[IU]/mL (ref 0.350–4.500)

## 2023-03-02 LAB — TYPE AND SCREEN
ABO/RH(D): O POS
Antibody Screen: NEGATIVE

## 2023-03-02 LAB — VITAMIN B12: Vitamin B-12: 4171 pg/mL — ABNORMAL HIGH (ref 180–914)

## 2023-03-02 LAB — AMMONIA: Ammonia: 15 umol/L (ref 9–35)

## 2023-03-02 MED ORDER — GABAPENTIN 250 MG/5ML PO SOLN
100.0000 mg | Freq: Three times a day (TID) | ORAL | Status: DC
  Administered 2023-03-02 – 2023-03-04 (×6): 100 mg
  Filled 2023-03-02 (×7): qty 2

## 2023-03-02 MED ORDER — EZETIMIBE 10 MG PO TABS
10.0000 mg | ORAL_TABLET | Freq: Every day | ORAL | Status: DC
  Administered 2023-03-02 – 2023-03-04 (×3): 10 mg
  Filled 2023-03-02 (×3): qty 1

## 2023-03-02 MED ORDER — SODIUM CHLORIDE 0.9 % IV SOLN
1.0000 g | Freq: Four times a day (QID) | INTRAVENOUS | Status: DC
  Administered 2023-03-02 – 2023-03-04 (×8): 1 g via INTRAVENOUS
  Filled 2023-03-02 (×10): qty 1000

## 2023-03-02 MED ORDER — FREE WATER
120.0000 mL | Status: DC
  Administered 2023-03-02 – 2023-03-04 (×12): 120 mL

## 2023-03-02 MED ORDER — ACETAMINOPHEN 325 MG PO TABS
650.0000 mg | ORAL_TABLET | Freq: Four times a day (QID) | ORAL | Status: DC | PRN
  Filled 2023-03-02: qty 2

## 2023-03-02 MED ORDER — LEVETIRACETAM 100 MG/ML PO SOLN
500.0000 mg | Freq: Two times a day (BID) | ORAL | Status: DC
  Administered 2023-03-02 – 2023-03-04 (×5): 500 mg
  Filled 2023-03-02 (×5): qty 5

## 2023-03-02 MED ORDER — FERROUS SULFATE 300 (60 FE) MG/5ML PO SOLN
408.0000 mg | Freq: Every day | ORAL | Status: DC
  Administered 2023-03-02 – 2023-03-04 (×3): 408 mg
  Filled 2023-03-02 (×3): qty 10

## 2023-03-02 MED ORDER — ACETAMINOPHEN 325 MG PO TABS
650.0000 mg | ORAL_TABLET | Freq: Four times a day (QID) | ORAL | Status: DC | PRN
  Administered 2023-03-02: 650 mg

## 2023-03-02 MED ORDER — FLUOXETINE HCL 20 MG PO CAPS
20.0000 mg | ORAL_CAPSULE | Freq: Every day | ORAL | Status: DC
  Administered 2023-03-02 – 2023-03-03 (×2): 20 mg
  Filled 2023-03-02 (×2): qty 1

## 2023-03-02 MED ORDER — ADULT MULTIVITAMIN LIQUID CH
15.0000 mL | Freq: Every day | ORAL | Status: DC
  Administered 2023-03-02 – 2023-03-04 (×3): 15 mL
  Filled 2023-03-02 (×3): qty 15

## 2023-03-02 MED ORDER — GLUCERNA 1.5 CAL PO LIQD
40.0000 mL/h | ORAL | Status: DC
  Administered 2023-03-02 – 2023-03-04 (×3): 40 mL/h
  Filled 2023-03-02 (×3): qty 1000

## 2023-03-02 NOTE — Assessment & Plan Note (Signed)
Unclear cause.  Abdominal exam benign - Trend LFTs

## 2023-03-02 NOTE — Assessment & Plan Note (Signed)
From stroke in Jan.  Severe dysarthria and inability to swallow is baseline. - Continue home tube feeds

## 2023-03-02 NOTE — Hospital Course (Signed)
Gina Kelley is an 87 y.o. female with past medical history of HTN, DM, large spontaneous subdural hematoma and intracerebral hemorrhage in Jan 2024 requiring craniotomy, 2 weeks intubation, status post chronic indwelling Foley catheter and pain, history of GERD PUD, bed bound, lives in [name] LTC, recently readmitted for aspiration pneumonia 2 weeks ago and continued to the hospital from skilled nursing facility at this time with decreased mentation,facial droop, slurred speech and lethargic..    In the ED, mild leukocytosis. UA with bacteria and WBCs.  CT head scan showed no acute abnormality but chronic small vessel disease.  Chest x-ray without any obvious infiltrate.  EKG showed sinus rhythm with first-degree AV block.  Assessment and plan.  Acute metabolic encephalopathy. CT head negative for acute intracranial abnormality.  No fever or meningeal signs.  MRI brain ruled out stroke, showed expected sequelae of SDH/ICH from Jan 2024..  TSH, ammonia normal.  CXR infiltrate.  WBC borderline high and urinalysis suggestive of infection.   Hgb down to 7.5s (from 9-10 g/dL) and FOBT+.    Vitamin B12 elevated at 4171.  TSH within normal range at 0.4.  On ampicillin for gram-negative UTI.  Check sensitivity.  Gram-negative UTI.  Urinary culture showing more than 100,000 Colonies of gram-negative rods.  On ampicillin for UTI.  Temperature max of one 1.43 Fahrenheit.  Hypotension Improved at this time.  Lactate was normal.  Initially, received gentle IV fluids.    Acute on chronic anemia Fecal occult blood was positive.  Baseline hemoglobin around 9-10.  Hemoglobin up to 7.7 in the hospital.  Not a candidate for endoscopy.  Add PPI.  Transfuse for hemoglobin less than 7.  Iron profile pending.   Mild hyponatremia Improved.  Latest sodium of 137.   Elevated LFTs. Possibly related to hypotension   AST ALT at 63 and 79 respectively.  Slightly improved.  Will check levels in AM.   History of ICH/SDH s/p  craniotomy earlier this year with resultant left-sided hemiparesis/dysphagia requiring PEG tube.  Continue tube feeds.  Keep NPO.   Seizure disorder Continue Keppra through PEG tube.   Type 2 diabetes mellitus. Well-controlled-recent hemoglobin A1c 5.8 on 02/14/2023.  Continue sliding scale insulin for now.  History of bladder outlet obstruction, on chronic indwelling catheter.    Debility, deconditioning.  Patient is from skilled nursing facility.

## 2023-03-02 NOTE — Evaluation (Signed)
Clinical/Bedside Swallow Evaluation Patient Details  Name: Gina Kelley MRN: [id number] Date of Birth: 1935-12-20  Today's Date: 03/02/2023 Time: SLP Start Time (ACUTE ONLY): 1555 SLP Stop Time (ACUTE ONLY): 1610 SLP Time Calculation (min) (ACUTE ONLY): 15 min  Past Medical History:  Past Medical History:  Diagnosis Date   Anemia of chronic disease 07/08/2006   Antral ulcer 02/23/2007   Seen on EGD in 2008, small ulcer with erosion    Cerebral vascular accident (HCC) 04/14/2006   1998, left lower extremity numbness, no residual deficits    Essential hypertension 04/14/2006   Gastroesophageal reflux disease 02/18/2013   Occasional, symptomatically relieved with peptobismol    Healthcare maintenance 12/04/2011   Hyperlipidemia LDL goal < 100 04/14/2006   Hypertensive retinopathy of both eyes, grade 1 05/15/2016   Osteopenia of right femoral neck 10/20/2016   DEXA (10/16/2016): R femur T -2.5 (FRAX tool calculates at -2.4), L1-L4 spine T -0.9, 10 year risk for: Major osteoporotic fracture 8.3%, Hip fracture 2.7%   Seizure disorder (HCC) 03/01/2023   Type 2 diabetes mellitus with stage 1 chronic kidney disease, without long-term current use of insulin (HCC)    Type II diabetes mellitus (HCC) 04/14/2006   Past Surgical History:  Past Surgical History:  Procedure Laterality Date   COLONOSCOPY     CRANIOTOMY Right 07/08/2022   Procedure: RIGHT CRANIOTOMY HEMATOMA EVACUATION SUBDURAL;  Surgeon: Julio Sicks, MD;  Location: [facility] OR;  Service: Neurosurgery;  Laterality: Right;   ESOPHAGOGASTRODUODENOSCOPY     ESOPHAGOGASTRODUODENOSCOPY (EGD) WITH PROPOFOL N/A 08/01/2022   Procedure: ESOPHAGOGASTRODUODENOSCOPY (EGD) WITH PROPOFOL;  Surgeon: Diamantina Monks, MD;  Location: [facility] ENDOSCOPY;  Service: General;  Laterality: N/A;   PEG PLACEMENT N/A 08/01/2022   Procedure: PERCUTANEOUS ENDOSCOPIC GASTROSTOMY (PEG) PLACEMENT;  Surgeon: Diamantina Monks, MD;  Location: [facility] ENDOSCOPY;  Service: General;   Laterality: N/A;   HPI:  Pt is an 87 yo female presenting to ED 9/1 from [name] with AMS. Recently admitted 8/16-8/21 for sepsis 2/2 aspiration PNA, acute metabolic encephalopathy, AKI. CTH and MRI Brain negative. Seen by SLP 02/14/23 and pt's daughter reports pt previously consuming Dys 1 diet with thin liquids at [name] with nightly tube feeds. At that time, she presented with signs concerning for dysphagia and aspiration and was recommended NPO with consideration for resuming alternative means of nutrition. MBS 08/20/22 with sensed aspiration of thin liquids and SLP targeting therapeutic trials of purees and honey thick liquids upon d/c from CIR. PMH includes anemia, CVA with residual L sided deficits and dysarthria, HTN, GERD, HLD, T2DM    Assessment / Plan / Recommendation  Clinical Impression  Pt presents with severe dysarthria, likely residual from previous CVA and significant L sided facial and lingual deficits. Observed pt with trials of thin liquids, honey thick liquids, and purees with similar presenation compared to SLP notes from prior admission. Pt unable to initiate sip through a straw today and required full assistance to initiate a sip via cup of both thin liquids and honey thick liquids with oral holding noted and immediate congested coughing following the swallow. Trials of purees resulted in oral holding and a delayed cough. Given pt's AMS and overall fluctuating dysphagia, prognosis for ability to protect airway remains poor overall at this time. Recommend she remain NPO with alternative means of nutrition in place. Will defer all treatment decisions to primary SLP at [name], although will f/u to continue to assess swallowing as mentation improves. SLP Visit Diagnosis: Dysphagia, oropharyngeal phase (R13.12)  Aspiration Risk  Moderate aspiration risk    Diet Recommendation NPO    Medication Administration: Via alternative means    Other  Recommendations Oral Care Recommendations: Oral  care QID;Staff/trained caregiver to provide oral care    Recommendations for follow up therapy are one component of a multi-disciplinary discharge planning process, led by the attending physician.  Recommendations may be updated based on patient status, additional functional criteria and insurance authorization.  Follow up Recommendations Skilled nursing-short term rehab (<3 hours/day)      Assistance Recommended at Discharge    Functional Status Assessment Patient has had a recent decline in their functional status and demonstrates the ability to make significant improvements in function in a reasonable and predictable amount of time.  Frequency and Duration min 2x/week  1 week       Prognosis Prognosis for improved oropharyngeal function: Fair Barriers to Reach Goals: Cognitive deficits;Language deficits;Time post onset      Swallow Study   General HPI: Pt is an 87 yo female presenting to ED 9/1 from [name] with AMS. Recently admitted 8/16-8/21 for sepsis 2/2 aspiration PNA, acute metabolic encephalopathy, AKI. CTH and MRI Brain negative. Seen by SLP 02/14/23 and pt's daughter reports pt previously consuming Dys 1 diet with thin liquids at [name] with nightly tube feeds. At that time, she presented with signs concerning for dysphagia and aspiration and was recommended NPO with consideration for resuming alternative means of nutrition. MBS 08/20/22 with sensed aspiration of thin liquids and SLP targeting therapeutic trials of purees and honey thick liquids upon d/c from CIR. PMH includes anemia, CVA with residual L sided deficits and dysarthria, HTN, GERD, HLD, T2DM Type of Study: Bedside Swallow Evaluation Previous Swallow Assessment: see HPI Diet Prior to this Study: NPO Temperature Spikes Noted: No Respiratory Status: Room air History of Recent Intubation: No Behavior/Cognition: Alert;Cooperative;Requires cueing Oral Cavity Assessment: Within Functional Limits Oral Care Completed by SLP:  No Oral Cavity - Dentition: Edentulous Vision: Impaired for self-feeding Self-Feeding Abilities: Total assist Patient Positioning: Upright in bed Baseline Vocal Quality: Normal Volitional Cough: Congested Volitional Swallow: Unable to elicit    Oral/Motor/Sensory Function Overall Oral Motor/Sensory Function: Severe impairment Facial ROM: Reduced left Facial Symmetry: Abnormal symmetry right Facial Strength: Reduced left Facial Sensation: Reduced left;Suspected CN V (Trigeminal) dysfunction Lingual ROM: Reduced left;Suspected CN XII (hypoglossal) dysfunction Lingual Symmetry: Abnormal symmetry left;Suspected CN XII (hypoglossal) dysfunction Lingual Strength: Reduced;Suspected CN XII (hypoglossal) dysfunction Lingual Sensation: Reduced;Suspected CN VII (facial) dysfunction-anterior 2/3 tongue   Ice Chips Ice chips: Not tested   Thin Liquid Thin Liquid: Impaired Presentation: Cup Oral Phase Impairments: Reduced labial seal Oral Phase Functional Implications: Oral holding Pharyngeal  Phase Impairments: Cough - Immediate    Nectar Thick Nectar Thick Liquid: Not tested   Honey Thick Honey Thick Liquid: Impaired Presentation: Cup Oral Phase Impairments: Reduced labial seal Oral Phase Functional Implications: Oral holding Pharyngeal Phase Impairments: Cough - Delayed   Puree Puree: Impaired Presentation: Spoon Oral Phase Functional Implications: Oral holding Pharyngeal Phase Impairments: Cough - Delayed   Solid     Solid: Not tested      Gwynneth Aliment, M.A., CF-SLP Speech Language Pathology, Acute Rehabilitation Services  Secure Chat preferred [phone]  03/02/2023,4:26 PM

## 2023-03-02 NOTE — Assessment & Plan Note (Addendum)
History of peptic ulcer bleeding in 2008.  No anticoagulation.   Hgb during last month admission 9-10, currently down to 7s.  Not a candidate for endoscopy. - Trend Hbg - Check iron stores - PPI

## 2023-03-02 NOTE — Assessment & Plan Note (Signed)
Bed bound at baseline, will plan to d/c to LTC at discharge

## 2023-03-02 NOTE — Assessment & Plan Note (Signed)
-   Hold metformin - Continue ss corrections

## 2023-03-02 NOTE — Progress Notes (Signed)
Initial Nutrition Assessment  DOCUMENTATION CODES:   Not applicable  INTERVENTION:  Glucerna 1.5@40ml /hr continuous    Free water flushes q4 hours    Regimen provides 1440kcal/day, 79g/day protein and 1429ml/day of free water.    Add MVI daily via tube   NUTRITION DIAGNOSIS:   Inadequate oral intake related to inability to eat as evidenced by NPO status.  GOAL:   Provide needs based on ASPEN/SCCM guidelines  MONITOR:   TF tolerance  REASON FOR ASSESSMENT:   New TF    ASSESSMENT:   87 y.o. female admits related to AMS. PMH includes: ICH/SDH s/p craniotomy earlier this year with resultant left-sided hemiparesis/dysphagia requiring PEG tube and resident of [name], seizure disorder, hypertension, hyperlipidemia, type 2 diabetes, mood disorder, bladder outlet obstruction requiring chronic indwelling Foley catheter, GERD/PUD. Pt is currently receiving medical management related to AMS and acute neurologic deficits.  Meds reviewed: ferrous sulfate, sliding scale insulin. Labs reviewed: BUN elevated.   RD unable to reach pt via phone. MD consult to initiate EN. Consult was then canceled. Pt with PEG tube in place. TF orders from previous admission were ordered. TF running as ordered. TF are meeting energy needs. RD will continue to monitor TF tolerance. Will attempt to gather nutrition hx details at f/u.   NUTRITION - FOCUSED PHYSICAL EXAM:  Remote assessment.  Diet Order:   Diet Order             Diet NPO time specified  Diet effective now                   EDUCATION NEEDS:   Not appropriate for education at this time  Skin:  Skin Assessment: Reviewed RN Assessment  Last BM:  PTA  Height:   Ht Readings from Last 1 Encounters:  03/02/23 5' 2.99" (1.6 m)    Weight:   Wt Readings from Last 1 Encounters:  03/02/23 53.1 kg    Ideal Body Weight:     BMI:  Body mass index is 20.74 kg/m.  Estimated Nutritional Needs:   Kcal:  1400-1600  kcals  Protein:  70-80 gm  Fluid:  1.3-1.5 L  Bethann Humble, RD, LDN, CNSC.

## 2023-03-02 NOTE — Consult Note (Addendum)
Attending physician's note   I have taken a history, reviewed the chart, and examined the patient. I performed a substantive portion of this encounter, including complete performance of at least one of the key components, in conjunction with the APP. I agree with the APP's note, impression, and recommendations with my edits.   87 year old female with medical history as outlined below, to include a long hospital course earlier this year for ICH/SDH s/p craniotomy in 06/2022 and subsequent left-sided hemiparesis/dysphagia, now residing at [facility].  Repeat hospitalization in 01/2020 with aspiration pneumonia with sepsis.  She was transferred from [name] for AMS and noted hypotension with concern for right facial droop.  GI service consulted for acute on chronic anemia.  Admission labs notable for the following: - H/H 7.7/24 with MCV/RDW 100/17 - Troponin negative x 2 - Blood cultures pending - Lactate negative - CXR w/o active cardiopulmonary disease - CT head without acute intracranial pathology - MRI brain also without e/o acute stroke  Baseline Hgb ~8-10 this year, and ~10 chronically for many years.  I discussed her clinical situation at length with her daughter at bedside.  No recent overt bleeding.  H/H slightly below baseline.  Based on age, significant comorbidities, overall health, she does not want any aggressive interventions, to include endoscopy.  She states she would only consider endoscopy for emergent "last resort" type measures, but even then would not likely want to proceed.  Based on her overall clinical situation, I agree with a more conservative approach in this 87 year old individual.  Inpatient GI service will remain available as needed.  [address], DO, FACG [phone] office           Consultation  Referring Provider:  [facility]  Primary Care Physician:  Patient, No Pcp Per Primary Gastroenterologist:  Dr. Leone Payor       Reason for Consultation:   Acute on  chronic anemia  LOS: 1 day          HPI:   Gina Kelley is a 87 y.o. female with past medical history significant for  ICH/SDH s/p craniotomy 06/2022 with resultant left-sided hemiparesis/dysphagia requiring PEG tube and resident of [name], seizure disorder, bladder outlet obstruction with chronic indwelling Foley catheter, GERD/PUD (2008), presents for evaluation of acute on chronic anemia  Patient initially presented to ED from [name] for altered mental status.  Daughter is in town from [place] and has been visiting for the last 10 days.  She states yesterday she went in and around 1 or 2 and found the patient altered, lethargic, drooping of her face with her tongue hanging out.  Patient received IVF with EMS due to hypotension.  Pertinent workup - Initial Hgb 7.7 (baseline 8-10)... Hgb 9.2 2 weeks ago - MCV 100 - Fecal occult positive - Iron studies pending - BUN 27, creatinine 0.9 - AST 76/ALT 88/alk phos and T. bili normal - CT head negative for acute intracranial abnormality - Chest x-ray unrevealing - MRI brain shows sequelae of right hemisphere hemorrhage and craniotomy.  Chronic microhemorrhages.  Daughter states patient is on oral iron.  She typically has green/darker stools which is not unusual for her.  She states patient can typically name the amount of children she has, is oriented. Today patient tells me it is 2022, very somnolent and difficult to arouse. Though denies pain/nausea/vomiting.  PREVIOUS GI WORKUP   Colonoscopy 2008 for screening: Normal.  Repeat 10 years  EGD 2008 for anemia and heme positive stool: small antral  ulcer with erosion and gastritis (likely due to ASA), nodular gastric mucosa, small hiatal hernia  Past Medical History:  Diagnosis Date   Anemia of chronic disease 07/08/2006   Antral ulcer 02/23/2007   Seen on EGD in 2008, small ulcer with erosion    Cerebral vascular accident (HCC) 04/14/2006   1998, left lower extremity numbness, no residual  deficits    Essential hypertension 04/14/2006   Gastroesophageal reflux disease 02/18/2013   Occasional, symptomatically relieved with peptobismol    Healthcare maintenance 12/04/2011   Hyperlipidemia LDL goal < 100 04/14/2006   Hypertensive retinopathy of both eyes, grade 1 05/15/2016   Osteopenia of right femoral neck 10/20/2016   DEXA (10/16/2016): R femur T -2.5 (FRAX tool calculates at -2.4), L1-L4 spine T -0.9, 10 year risk for: Major osteoporotic fracture 8.3%, Hip fracture 2.7%   Seizure disorder (HCC) 03/01/2023   Type 2 diabetes mellitus with stage 1 chronic kidney disease, without long-term current use of insulin (HCC)    Type II diabetes mellitus (HCC) 04/14/2006    Surgical History:  She  has a past surgical history that includes Esophagogastroduodenoscopy; Colonoscopy; Craniotomy (Right, 07/08/2022); Esophagogastroduodenoscopy (egd) with propofol (N/A, 08/01/2022); and PEG placement (N/A, 08/01/2022). Family History:  Her family history includes Anuerysm (age of onset: 50) in her father; Asthma in her brother and sister; Breast cancer in her sister; Cirrhosis in her brother; Diabetes in her mother; Hypertension in her mother and son; Pulmonary disease in her brother; Stroke in her mother and sister. Social History:   reports that she quit smoking about 44 years ago. Her smoking use included cigarettes. She has never used smokeless tobacco. She reports that she does not drink alcohol and does not use drugs.  Prior to Admission medications   Medication Sig Start Date End Date Taking? Authorizing Provider  acetaminophen (TYLENOL) 500 MG tablet Place 500-1,000 mg into feeding tube See admin instructions. 1000 mg via G-tube twice daily and 500 mg four times daily as needed for pain   Yes [provider]  amLODipine (NORVASC) 10 MG tablet Place 1 tablet (10 mg total) into feeding tube daily. 08/23/22  Yes Setzer, Lynnell Jude, PA-C  ascorbic acid (VITAMIN C) 500 MG tablet Place 1 tablet  (500 mg total) into feeding tube daily. 08/23/22  Yes Setzer, Lynnell Jude, PA-C  atorvastatin (LIPITOR) 80 MG tablet Place 1 tablet (80 mg total) into feeding tube daily. 08/23/22  Yes Setzer, Lynnell Jude, PA-C  carvedilol (COREG) 6.25 MG tablet Place 1 tablet (6.25 mg total) into feeding tube 2 (two) times daily with a meal. 08/22/22  Yes Setzer, Lynnell Jude, PA-C  ezetimibe (ZETIA) 10 MG tablet Place 1 tablet (10 mg total) into feeding tube daily. 08/23/22  Yes Setzer, Lynnell Jude, PA-C  famotidine (PEPCID) 20 MG tablet Place 1 tablet (20 mg total) into feeding tube 2 (two) times daily. 08/22/22  Yes Setzer, Lynnell Jude, PA-C  ferrous sulfate 300 (60 Fe) MG/5ML syrup Place 5 mLs (300 mg total) into feeding tube daily with breakfast. Patient taking differently: Place 408 mg into feeding tube daily. 08/23/22  Yes Setzer, Lynnell Jude, PA-C  FLUoxetine (PROZAC) 20 MG capsule Place 1 capsule (20 mg total) into feeding tube at bedtime. 08/22/22  Yes Setzer, Lynnell Jude, PA-C  gabapentin (NEURONTIN) 250 MG/5ML solution Place 2 mLs (100 mg total) into feeding tube every 8 (eight) hours. 08/22/22  Yes Setzer, Lynnell Jude, PA-C  guaiFENesin (ROBITUSSIN) 100 MG/5ML liquid Place 200 mg into feeding tube 4 (four)  times daily.   Yes [provider]  insulin lispro (HUMALOG) 100 UNIT/ML KwikPen Inject 0-15 Units into the skin See admin instructions. Inject 0-15 units 4 times daily per sliding scale: < 69 : implement hypoglycemic protocol 70-150 : 0 units 151-200 : 3 units 201-250 : 6 units 251-300 : 9 units 301-350 : 12 units 351-400 : 16 units > 401 : call MD   Yes [provider]  ipratropium-albuterol (DUONEB) 0.5-2.5 (3) MG/3ML SOLN Take 3 mLs by nebulization every 4 (four) hours as needed. 02/18/23  Yes Ghimire, Werner Lean, MD  l-methylfolate-B6-B12 (METANX) 3-35-2 MG TABS tablet Place 1 tablet into feeding tube daily. 08/23/22  Yes Setzer, Lynnell Jude, PA-C  levETIRAcetam (KEPPRA) 100 MG/ML solution Place 5 mLs (500 mg  total) into feeding tube 2 (two) times daily. 01/22/23  Yes Micki Riley, MD  lidocaine 4 % Place 1 patch onto the skin daily.   Yes [provider]  metFORMIN (GLUCOPHAGE) 500 MG tablet Place 1 tablet (500 mg total) into feeding tube 2 (two) times daily with a meal. Patient taking differently: Place 750 mg into feeding tube 2 (two) times daily. 08/22/22  Yes Setzer, Lynnell Jude, PA-C  Multiple Vitamins-Minerals (PRESERVISION AREDS 2) CHEW Give 1 each by tube 2 (two) times daily.   Yes [provider]  Nutritional Supplements (FEEDING SUPPLEMENT, GLUCERNA 1.5 CAL,) LIQD Place 1,000 mLs into feeding tube daily. Patient taking differently: Place 40 mL/hr into feeding tube daily. 02/18/23  Yes Ghimire, Werner Lean, MD  ondansetron (ZOFRAN) 4 MG tablet Take 4 mg by mouth every 8 (eight) hours as needed for nausea or vomiting.   Yes [provider]  Water For Irrigation, Sterile (FREE WATER) SOLN Place 120 mLs into feeding tube every 4 (four) hours. 02/18/23  Yes Ghimire, Werner Lean, MD  diclofenac Sodium (VOLTAREN) 1 % GEL Apply 2 g topically 4 (four) times daily. Patient not taking: Reported on 03/02/2023 08/22/22   Milinda Antis, PA-C    Current Facility-Administered Medications  Medication Dose Route Frequency Provider Last Rate Last Admin   0.9 %  sodium chloride infusion   Intravenous Continuous John Giovanni, MD 75 mL/hr at 03/02/23 0137 New Bag at 03/02/23 0137   insulin aspart (novoLOG) injection 0-9 Units  0-9 Units Subcutaneous Q4H John Giovanni, MD        Allergies as of 03/01/2023 - Review Complete 03/01/2023  Allergen Reaction Noted   Cozaar [losartan] Swelling 08/05/2022   Periogard [chlorhexidine gluconate] Swelling and Other (See Comments)    Pork allergy Other (See Comments) 02/13/2023   Vasotec [enalapril] Cough 09/02/2013    Review of Systems  Unable to perform ROS: Acuity of condition       Physical Exam:  Vital signs in last 24  hours: Temp:  [97.8 F (36.6 C)-99 F (37.2 C)] 97.8 F (36.6 C) (09/02 0754) Pulse Rate:  [63-76] 76 (09/02 0754) Resp:  [13-17] 17 (09/02 0754) BP: (108-131)/(58-65) 125/60 (09/02 0754) SpO2:  [91 %-100 %] 91 % (09/02 0754) Weight:  [53.1 kg] 53.1 kg (09/02 0100)   Last BM recorded by nurses in past 5 days No data recorded  Physical Exam Constitutional:      Appearance: She is ill-appearing.     Comments: Somnolent. Slurred speech and R sided facial droop. Difficult to arouse.  HENT:     Nose: Nose normal.     Mouth/Throat:     Mouth: Mucous membranes are dry.  Eyes:  General: No scleral icterus.    Extraocular Movements: Extraocular movements intact.  Cardiovascular:     Rate and Rhythm: Normal rate and regular rhythm.  Pulmonary:     Effort: Pulmonary effort is normal.     Breath sounds: Normal breath sounds.  Abdominal:     General: Bowel sounds are normal. There is no distension.     Palpations: Abdomen is soft. There is no mass.     Tenderness: There is no abdominal tenderness. There is no guarding or rebound.     Hernia: No hernia is present.  Musculoskeletal:     Cervical back: Normal range of motion and neck supple.  Skin:    General: Skin is warm.     Coloration: Skin is not jaundiced.  Neurological:     General: No focal deficit present.     Mental Status: She is oriented to person, place, and time.  Psychiatric:        Mood and Affect: Mood normal.        Behavior: Behavior normal.        Thought Content: Thought content normal.        Judgment: Judgment normal.      LAB RESULTS: Recent Labs    03/01/23 1800 03/02/23 0417  WBC 11.0* 9.7  HGB 7.7* 7.7*  HCT 23.9* 24.0*  PLT 385 351   BMET Recent Labs    03/01/23 1800 03/02/23 0417  NA 134* 135  K 4.2 3.6  CL 93* 96*  CO2 31 31  GLUCOSE 122* 77  BUN 27* 26*  CREATININE 0.90 0.78  CALCIUM 8.8* 9.2   LFT Recent Labs    03/02/23 0417  PROT 6.6  ALBUMIN 2.4*  AST 63*  ALT 79*   ALKPHOS 73  BILITOT 0.7   PT/INR No results for input(s): "LABPROT", "INR" in the last 72 hours.  STUDIES: MR BRAIN WO CONTRAST  Result Date: 03/02/2023 CLINICAL DATA:  87 year old female with altered mental status, neurologic deficit. History of right hemisphere intra-axial hemorrhage with subdural extension in January, craniotomy at that time. EXAM: MRI HEAD WITHOUT CONTRAST TECHNIQUE: Multiplanar, multiecho pulse sequences of the brain and surrounding structures were obtained without intravenous contrast. COMPARISON:  Previous head CTs 03/01/2023 and earlier. FINDINGS: Brain: Right hemisphere encephalomalacia and gliosis with superficial siderosis and some residual intra-axial hemosiderin on SWI. Overlying craniotomy and mild ex vacuo enlargement of the right lateral ventricle. Blood product related susceptibility on DWI. No restricted diffusion or evidence of acute infarction. Evidence of Wallerian degeneration in the right deep gray nuclei, also the brainstem. Superimposed additional scattered chronic microhemorrhages in the brain, including clustered in the right occipital lobe. And some superficial siderosis there which is somewhat remote from the surgery site., no definite contralateral or posterior fossa siderosis. Chronic lacunar infarct of the left lentiform. Chronic lacunar infarcts in the central pons. Small chronic infarct in the left cerebellum. No midline shift, mass effect, evidence of mass lesion, IVH, ventriculomegaly,or acute intracranial hemorrhage. Cervicomedullary junction and pituitary are within normal limits. Vascular: Major intracranial vascular flow voids are preserved, the distal right vertebral artery appears dominant. There is evidence of intracranial atherosclerosis. Skull and upper cervical spine: Negative for age visible cervical spine. Visualized bone marrow signal is within normal limits. Previous right convexity craniotomy. Sinuses/Orbits: Postoperative changes to both  globes. Paranasal Visualized paranasal sinuses and mastoids are stable and well aerated. Other: Grossly negative visible internal auditory structures. No acute orbit or scalp soft tissue finding. IMPRESSION: 1. No  acute intracranial abnormality. 2. Sequelae of right hemisphere hemorrhage and craniotomy with relatively extensive encephalomalacia, superficial siderosis, and Wallerian degeneration. Superimposed chronic small vessel disease in the left hemisphere, brainstem, to a lesser extent cerebellum. Additional chronic microhemorrhages are mostly in the right hemisphere. Still, amyloid angiopathy is difficult to exclude. Electronically Signed   By: Odessa Fleming M.D.   On: 03/02/2023 06:58   CT Head Wo Contrast  Result Date: 03/01/2023 CLINICAL DATA:  Mental status change, unknown cause. Low blood pressures. EXAM: CT HEAD WITHOUT CONTRAST TECHNIQUE: Contiguous axial images were obtained from the base of the skull through the vertex without intravenous contrast. RADIATION DOSE REDUCTION: This exam was performed according to the departmental dose-optimization program which includes automated exposure control, adjustment of the mA and/or kV according to patient size and/or use of iterative reconstruction technique. COMPARISON:  CT head 02/13/2023 FINDINGS: Brain: No intracranial hemorrhage, mass effect, or evidence of acute infarct. No hydrocephalus. No extra-axial fluid collection. Age-commensurate cerebral atrophy and ill-defined hypoattenuation within the cerebral white matter consistent with chronic small vessel ischemic disease. Unchanged right frontal encephalomalacia. Remote left basal ganglia infarct. Vascular: No hyperdense vessel. Intracranial arterial calcification. Skull: No fracture or focal lesion.  Right craniotomy. Sinuses/Orbits: No acute finding. Paranasal sinuses and mastoid air cells are well aerated. Other: None. IMPRESSION: 1. No acute intracranial abnormality. 2. Chronic small vessel ischemic  disease and remote infarcts as described. Electronically Signed   By: Minerva Fester M.D.   On: 03/01/2023 20:24   DG Chest Port 1 View  Result Date: 03/01/2023 CLINICAL DATA:  Cough, weakness EXAM: PORTABLE CHEST 1 VIEW COMPARISON:  Chest radiograph dated 02/13/2023. FINDINGS: The heart size and mediastinal contours are within normal limits. Vascular calcifications are seen in the aortic arch. The right hemidiaphragm is elevated relative to the left. Both lungs are clear. Degenerative changes are seen in the spine. IMPRESSION: No active cardiopulmonary disease. Electronically Signed   By: Romona Curls M.D.   On: 03/01/2023 19:13      Impression    87 y.o. female with past medical history significant for  ICH/SDH s/p craniotomy 06/2022 with resultant left-sided hemiparesis/dysphagia requiring PEG tube and resident of [name], seizure disorder, bladder outlet obstruction with chronic indwelling Foley catheter, GERD/PUD (2008) 2/2 NSAIDs, presents for evaluation of acute on chronic anemia with initial hgb 7.7 (drop from 9.2 2 weeks ago) with a baseline hgb 8-10. Initially presented to ED with AMS.  Acute on chronic anemia - Initial Hgb 7.7 (baseline 8-10)... Hgb 9.2 2 weeks ago - MCV 100 - Fecal occult positive - Iron studies pending - BUN 27, creatinine 0.9 - AST 76/ALT 88/alk phos and T. bili normal - CT head negative for acute intracranial abnormality - Chest x-ray unrevealing -MRI brain shows sequelae of right hemisphere hemorrhage and craniotomy.  Chronic microhemorrhages. Possible UGIB with isolated elevated BUN of 27, acute on chronic anemia, and heme positive stool.  However, appears no overt bleeding aside from baseline dark stools from chronic iron supplementation.  Patient does have remote history of PUD secondary to NSAID use in 2008.  MRI brain does show sequelae of right hemisphere hemorrhage and craniotomy as well as chronic microhemorrhages which could possibly be causing her acute on  chronic anemia as well.    Altered mental status Acute neurologic deficits -CT head negative for acute intracranial abnormality - MRI brain with sequelae of right hemisphere hemorrhage and craniotomy.  Chronic microhemorrhages. - Workup per primary  History of ICH/SDH s/p craniotomy earlier  this year with resultant left-sided hemiparesis/dysphagia requiring PEG tube   Seizure disorder On Keppra   Plan   ---Discussed risks versus benefits with daughter of endoscopic evaluation in this 87 year old with multiple comorbidities.  Currently getting extensive workup for altered mental status.  May consider conservative management while continued workup progresses.   ---Ultimately, defer to Dr. Barron Alvine for further recommendations in regards to endoscopic intervention --- Continue daily CBC and transfuse as needed to maintain HGB > 7  --- Protonix 40 Mg IV twice daily --- Await iron studies  Thank you for your kind consultation, we will continue to follow.   Bayley Leanna Sato  03/02/2023, 9:49 AM

## 2023-03-02 NOTE — Assessment & Plan Note (Signed)
MRI brain ruled out stroke, showed expected sequelae of SDH/ICH from Jan.  TSH, ammonia normal.  CXR clear.  WBC borderline high and urinalysis suggestive of infection.  Also Hgb down to 7s (from 9-10 g/dL) and FOBT+.  - Start antibiotics for ?UTI - Obtain urine culture - Resume tube feeds and free water

## 2023-03-02 NOTE — Assessment & Plan Note (Addendum)
Continue Keppra and gabapentin

## 2023-03-02 NOTE — Progress Notes (Signed)
CSW was informed by MD that pt will dc back to [name] tomorrow. CSW confirmed pt was LTC with Lowella Bandy at [facility] and they will expect to admit pt tomorrow.    Oletta Lamas, MSW, LCSWA, LCASA Transitions of Care  Clinical Social Worker I

## 2023-03-02 NOTE — Plan of Care (Signed)

## 2023-03-02 NOTE — Plan of Care (Signed)
  Problem: Nutrition: Goal: Adequate nutrition will be maintained Outcome: Progressing   

## 2023-03-02 NOTE — Progress Notes (Signed)
  Progress Note   Patient: Gina Kelley:[id number] DOB: 29-Mar-1936 DOA: 03/01/2023     1 DOS: the patient was seen and examined on 03/02/2023        Brief hospital course: Mrs. Nored is an 87 y.o. F with HTN, DM, hx large spontaneous subdural hematoma and intracerebral hemorrhage Jan 2024 requiring craniotomy, 2 weeks intubation, now with PEG, bed bound, lives in [name] LTC, recently readmitted for aspiration pneumonia 2 weeks ago who returned for decreased mentation, facial droop.  In the ER, WBC 11, UA with bacteria and WBCs.  Admitted for further work up.     Assessment and Plan: * Acute metabolic encephalopathy MRI brain ruled out stroke, showed expected sequelae of SDH/ICH from Jan.  TSH, ammonia normal.  CXR clear.  WBC borderline high and urinalysis suggestive of infection.  Also Hgb down to 7s (from 9-10 g/dL) and FOBT+.  - Start antibiotics for ?UTI - Obtain urine culture - Resume tube feeds and free water    Anemia, with occult fecal blood History of peptic ulcer bleeding in 2008.  No anticoagulation.   Hgb during last month admission 9-10, currently down to 7s.  Not a candidate for endoscopy. - Trend Hbg - Check iron stores - PPI  Dysphagia From stroke in Jan.  Severe dysarthria and inability to swallow is baseline. - Continue home tube feeds  Seizure disorder (HCC) - Continue Keppra and gabapentin  History of intracranial hemorrhage Bed bound at baseline, will plan to d/c to LTC at discharge  Transaminitis Unclear cause.  Abdominal exam benign - Trend LFTs  Type 2 diabetes mellitus (HCC) - Hold metformin - Continue ss corrections          Subjective: Patient is still quite subdued and somnolent relative to her baseline per daughter.  She still very weak and tired.  No fever overnight.  Patient not able to volunteer any focal symptoms.  No melena observed by nursing.     Physical Exam: BP 131/72 (BP Location: Right Arm)   Pulse 96   Temp  98.4 F (36.9 C) (Oral)   Resp 17   Ht 5' 2.99" (1.6 m)   Wt 53.1 kg   SpO2 90%   BMI 20.74 kg/m   Frail elderly female, appears weak and tired, unable to lift her head, lying in bed, opens eyes to tactile stimulation, tries to gesture but extremely weak RRR, no murmurs, mild diffuse peripheral edema Respiratory rate normal, lungs clear without rales or wheezes Abdomen without grimace to palpation, no guarding, no focal tenderness. Severe left-sided hemiparesis, right side very weak.  Severe dysarthria at baseline.  However able to recognize her daughter, called her by incorrect name, oriented to hospital only.  Data Reviewed: Discussed with GI CBC shows hemoglobin in the sevens Urinalysis with bacteria B12 normal TSH normal LFTs slightly elevated Creatinine, sodium, potassium normal  Family Communication: Daughter at the bedside    Disposition: Status is: Inpatient         Author: Alberteen Sam, MD 03/02/2023 5:56 PM  For on call review [email or web address].

## 2023-03-03 DIAGNOSIS — Z8679 Personal history of other diseases of the circulatory system: Secondary | ICD-10-CM | POA: Diagnosis not present

## 2023-03-03 DIAGNOSIS — N181 Chronic kidney disease, stage 1: Secondary | ICD-10-CM

## 2023-03-03 DIAGNOSIS — R7401 Elevation of levels of liver transaminase levels: Secondary | ICD-10-CM

## 2023-03-03 DIAGNOSIS — E1122 Type 2 diabetes mellitus with diabetic chronic kidney disease: Secondary | ICD-10-CM

## 2023-03-03 DIAGNOSIS — G40909 Epilepsy, unspecified, not intractable, without status epilepticus: Secondary | ICD-10-CM | POA: Diagnosis not present

## 2023-03-03 DIAGNOSIS — N3000 Acute cystitis without hematuria: Secondary | ICD-10-CM

## 2023-03-03 DIAGNOSIS — G9341 Metabolic encephalopathy: Secondary | ICD-10-CM | POA: Diagnosis not present

## 2023-03-03 LAB — BASIC METABOLIC PANEL
Anion gap: 7 (ref 5–15)
BUN: 18 mg/dL (ref 8–23)
CO2: 32 mmol/L (ref 22–32)
Calcium: 8.7 mg/dL — ABNORMAL LOW (ref 8.9–10.3)
Chloride: 98 mmol/L (ref 98–111)
Creatinine, Ser: 0.52 mg/dL (ref 0.44–1.00)
GFR, Estimated: >60 mL/min (ref >60)
Glucose, Bld: 110 mg/dL — ABNORMAL HIGH (ref 70–99)
Potassium: 3.5 mmol/L (ref 3.5–5.1)
Sodium: 137 mmol/L (ref 135–145)

## 2023-03-03 LAB — GLUCOSE, CAPILLARY
Glucose-Capillary: 155 mg/dL — ABNORMAL HIGH (ref 70–99)
Glucose-Capillary: 113 mg/dL — ABNORMAL HIGH (ref 70–99)
Glucose-Capillary: 146 mg/dL — ABNORMAL HIGH (ref 70–99)
Glucose-Capillary: 136 mg/dL — ABNORMAL HIGH (ref 70–99)
Glucose-Capillary: 141 mg/dL — ABNORMAL HIGH (ref 70–99)

## 2023-03-03 LAB — CBC
HCT: 22.7 % — ABNORMAL LOW (ref 36.0–46.0)
Hemoglobin: 7.5 g/dL — ABNORMAL LOW (ref 12.0–15.0)
MCH: 32.3 pg (ref 26.0–34.0)
MCHC: 33 g/dL (ref 30.0–36.0)
MCV: 97.8 fL (ref 80.0–100.0)
Platelets: 329 10*3/uL (ref 150–400)
RBC: 2.32 MIL/uL — ABNORMAL LOW (ref 3.87–5.11)
RDW: 17.4 % — ABNORMAL HIGH (ref 11.5–15.5)
WBC: 9.7 10*3/uL (ref 4.0–10.5)
nRBC: 0 % (ref 0.0–0.2)

## 2023-03-03 LAB — IRON AND TIBC
Iron: 15 ug/dL — ABNORMAL LOW (ref 28–170)
Saturation Ratios: 5 % — ABNORMAL LOW (ref 10.4–31.8)
TIBC: 280 ug/dL (ref 250–450)
UIBC: 265 ug/dL

## 2023-03-03 LAB — FERRITIN: Ferritin: 96 ng/mL (ref 11–307)

## 2023-03-03 MED ORDER — PANTOPRAZOLE SODIUM 40 MG IV SOLR
40.0000 mg | Freq: Every day | INTRAVENOUS | Status: DC
  Administered 2023-03-03 – 2023-03-04 (×2): 40 mg via INTRAVENOUS
  Filled 2023-03-03 (×2): qty 10

## 2023-03-03 NOTE — Plan of Care (Signed)
Pt remains on room air overnight. Febrile overnight with Tmax 101.12F. On call provider notified, PRN tylenol ordered, Fever resolved. No fever since this. Other VSS at baseline.   Problem: Fluid Volume: Goal: Hemodynamic stability will improve Outcome: Progressing   Problem: Clinical Measurements: Goal: Diagnostic test results will improve Outcome: Progressing Goal: Signs and symptoms of infection will decrease Outcome: Progressing   Problem: Respiratory: Goal: Ability to maintain adequate ventilation will improve Outcome: Progressing

## 2023-03-03 NOTE — Progress Notes (Signed)
PROGRESS NOTE    Gina Kelley  JYN:[id number] DOB: [birth year] DOA: 03/01/2023 PCP: Patient, No Pcp Per    Brief Narrative:  Gina Kelley is an 87 y.o. female with past medical history of HTN, DM, large spontaneous subdural hematoma and intracerebral hemorrhage in Jan 2024 requiring craniotomy, 2 weeks intubation, status post chronic indwelling Foley catheter and pain, history of GERD PUD, bed bound, lives in [name] LTC, recently readmitted for aspiration pneumonia 2 weeks ago and continued to the hospital from skilled nursing facility at this time with decreased mentation,facial droop, slurred speech and lethargic. In the ED, mild leukocytosis. UA with bacteria and WBCs.  CT head scan showed no acute abnormality but chronic small vessel disease.  Chest x-ray without any obvious infiltrate.  EKG showed sinus rhythm with first-degree AV block.  Assessment and plan.  Acute metabolic encephalopathy. CT head negative for acute intracranial abnormality.  No fever or meningeal signs.  MRI brain ruled out stroke, showed expected sequelae of SDH/ICH from Jan 2024..  TSH, ammonia normal.  CXR infiltrate.  WBC borderline high and urinalysis suggestive of infection.   Hgb down to 7.5s (from 9-10 g/dL) and FOBT+.    Vitamin B12 elevated at 4171.  TSH within normal range at 0.4.  On ampicillin for gram-negative UTI.  Check sensitivity.  Gram-negative UTI.  Urinary culture showing more than 100,000 Colonies of gram-negative rods.  On ampicillin for UTI.  Temperature max of one 1.43 Fahrenheit.  Hypotension Improved at this time.  Lactate was normal.  Initially, received gentle IV fluids.    Acute on chronic anemia Fecal occult blood was positive.  Baseline hemoglobin around 9-10.  Hemoglobin up to 7.7 in the hospital.  Not a candidate for endoscopy.  Add PPI.  Transfuse for hemoglobin less than 7.  Iron profile pending.   Mild hyponatremia Improved.  Latest sodium of 137.   Elevated LFTs. Possibly  related to hypotension   AST ALT at 63 and 79 respectively.  Slightly improved.  Will check levels in AM.   History of ICH/SDH s/p craniotomy earlier this year with resultant left-sided hemiparesis/dysphagia requiring PEG tube.  Continue tube feeds.  Keep NPO.   Seizure disorder Continue Keppra through PEG tube.   Type 2 diabetes mellitus. Well-controlled-recent hemoglobin A1c 5.8 on 02/14/2023.  Continue sliding scale insulin for now.  History of bladder outlet obstruction, on chronic indwelling catheter.    Debility, deconditioning.  Patient is from skilled nursing facility.  Plan for skilled nursing facility on discharge.    DVT prophylaxis: SCDs Start: 03/01/23 2234   Code Status:     Code Status: Full Code  Disposition:  Skilled nursing facility likely 03/04/2023 if clinically improved.  Status is: Inpatient  Remains inpatient appropriate because: Antibiotics, patient from skilled nursing facility.   Family Communication:  Spoke with the patient's daughter on the phone and updated her about the clinical condition of the patient.  Consultants:  GI  Procedures:  None  Antimicrobials:  Ampicillin >03/02/2023  Anti-infectives (From admission, onward)    Start     Dose/Rate Route Frequency Ordered Stop   03/02/23 1200  ampicillin (OMNIPEN) 1 g in sodium chloride 0.9 % 100 mL IVPB        1 g 300 mL/hr over 20 Minutes Intravenous Every 6 hours 03/02/23 1011         Subjective: Today, patient was seen and examined at bedside.  Patient feels okay.  Denies any nausea vomiting.  Speech distinct.  Objective:  Vitals:   03/02/23 2220 03/02/23 2340 03/03/23 0402 03/03/23 0818  BP:  (!) 142/74 (!) 140/72 (!) 129/56  Pulse:  90 80   Resp:  18 18 19   Temp: 100.3 F (37.9 C) 100.2 F (37.9 C) 98.9 F (37.2 C) 98 F (36.7 C)  TempSrc: Oral Oral Oral Axillary  SpO2:  92% 95% 97%  Weight:      Height:        Intake/Output Summary (Last 24 hours) at 03/03/2023 1121 Last  data filed at 03/03/2023 0818 Gross per 24 hour  Intake 2407.2 ml  Output 950 ml  Net 1457.2 ml   Filed Weights   03/02/23 0100  Weight: 53.1 kg    Physical Examination: Body mass index is 20.74 kg/m.   General:  thinly built, not in obvious distress HENT:   No scleral pallor or icterus noted. Oral mucosa is moist.  Chest: .  Diminished breath sounds bilaterally. No crackles or wheezes.  CVS: S1 &S2 heard. No murmur.  Regular rate and rhythm. Abdomen: Soft, nontender, nondistended.  Bowel sounds are heard.  PEG tube in place.  External urinary catheter in place. Extremities: No cyanosis, clubbing or edema.  Peripheral pulses are palpable. Psych: Alert, awake and oriented, normal mood CNS:  No cranial nerve deficits.  Moves all extremities  Skin: Warm and dry.  No rashes noted.  Data Reviewed:   CBC: Recent Labs  Lab 03/01/23 1800 03/02/23 0417 03/03/23 0739  WBC 11.0* 9.7 9.7  NEUTROABS 9.2*  --   --   HGB 7.7* 7.7* 7.5*  HCT 23.9* 24.0* 22.7*  MCV 100.0 96.8 97.8  PLT 385 351 329    Basic Metabolic Panel: Recent Labs  Lab 03/01/23 1800 03/02/23 0417 03/03/23 0739  NA 134* 135 137  K 4.2 3.6 3.5  CL 93* 96* 98  CO2 31 31 32  GLUCOSE 122* 77 110*  BUN 27* 26* 18  CREATININE 0.90 0.78 0.52  CALCIUM 8.8* 9.2 8.7*    Liver Function Tests: Recent Labs  Lab 03/01/23 1800 03/02/23 0417  AST 76* 63*  ALT 88* 79*  ALKPHOS 98 73  BILITOT 0.5 0.7  PROT 6.4* 6.6  ALBUMIN 2.4* 2.4*     Radiology Studies: MR BRAIN WO CONTRAST  Result Date: 03/02/2023 CLINICAL DATA:  87 year old female with altered mental status, neurologic deficit. History of right hemisphere intra-axial hemorrhage with subdural extension in January, craniotomy at that time. EXAM: MRI HEAD WITHOUT CONTRAST TECHNIQUE: Multiplanar, multiecho pulse sequences of the brain and surrounding structures were obtained without intravenous contrast. COMPARISON:  Previous head CTs 03/01/2023 and earlier.  FINDINGS: Brain: Right hemisphere encephalomalacia and gliosis with superficial siderosis and some residual intra-axial hemosiderin on SWI. Overlying craniotomy and mild ex vacuo enlargement of the right lateral ventricle. Blood product related susceptibility on DWI. No restricted diffusion or evidence of acute infarction. Evidence of Wallerian degeneration in the right deep gray nuclei, also the brainstem. Superimposed additional scattered chronic microhemorrhages in the brain, including clustered in the right occipital lobe. And some superficial siderosis there which is somewhat remote from the surgery site., no definite contralateral or posterior fossa siderosis. Chronic lacunar infarct of the left lentiform. Chronic lacunar infarcts in the central pons. Small chronic infarct in the left cerebellum. No midline shift, mass effect, evidence of mass lesion, IVH, ventriculomegaly,or acute intracranial hemorrhage. Cervicomedullary junction and pituitary are within normal limits. Vascular: Major intracranial vascular flow voids are preserved, the distal right vertebral artery appears dominant. There is  evidence of intracranial atherosclerosis. Skull and upper cervical spine: Negative for age visible cervical spine. Visualized bone marrow signal is within normal limits. Previous right convexity craniotomy. Sinuses/Orbits: Postoperative changes to both globes. Paranasal Visualized paranasal sinuses and mastoids are stable and well aerated. Other: Grossly negative visible internal auditory structures. No acute orbit or scalp soft tissue finding. IMPRESSION: 1. No acute intracranial abnormality. 2. Sequelae of right hemisphere hemorrhage and craniotomy with relatively extensive encephalomalacia, superficial siderosis, and Wallerian degeneration. Superimposed chronic small vessel disease in the left hemisphere, brainstem, to a lesser extent cerebellum. Additional chronic microhemorrhages are mostly in the right hemisphere.  Still, amyloid angiopathy is difficult to exclude. Electronically Signed   By: Odessa Fleming M.D.   On: 03/02/2023 06:58   CT Head Wo Contrast  Result Date: 03/01/2023 CLINICAL DATA:  Mental status change, unknown cause. Low blood pressures. EXAM: CT HEAD WITHOUT CONTRAST TECHNIQUE: Contiguous axial images were obtained from the base of the skull through the vertex without intravenous contrast. RADIATION DOSE REDUCTION: This exam was performed according to the departmental dose-optimization program which includes automated exposure control, adjustment of the mA and/or kV according to patient size and/or use of iterative reconstruction technique. COMPARISON:  CT head 02/13/2023 FINDINGS: Brain: No intracranial hemorrhage, mass effect, or evidence of acute infarct. No hydrocephalus. No extra-axial fluid collection. Age-commensurate cerebral atrophy and ill-defined hypoattenuation within the cerebral white matter consistent with chronic small vessel ischemic disease. Unchanged right frontal encephalomalacia. Remote left basal ganglia infarct. Vascular: No hyperdense vessel. Intracranial arterial calcification. Skull: No fracture or focal lesion.  Right craniotomy. Sinuses/Orbits: No acute finding. Paranasal sinuses and mastoid air cells are well aerated. Other: None. IMPRESSION: 1. No acute intracranial abnormality. 2. Chronic small vessel ischemic disease and remote infarcts as described. Electronically Signed   By: Minerva Fester M.D.   On: 03/01/2023 20:24   DG Chest Port 1 View  Result Date: 03/01/2023 CLINICAL DATA:  Cough, weakness EXAM: PORTABLE CHEST 1 VIEW COMPARISON:  Chest radiograph dated 02/13/2023. FINDINGS: The heart size and mediastinal contours are within normal limits. Vascular calcifications are seen in the aortic arch. The right hemidiaphragm is elevated relative to the left. Both lungs are clear. Degenerative changes are seen in the spine. IMPRESSION: No active cardiopulmonary disease.  Electronically Signed   By: Romona Curls M.D.   On: 03/01/2023 19:13      LOS: 2 days    Joycelyn Das, MD [facility] Available via Epic secure chat 7am-7pm After these hours, please refer to coverage provider listed on [email or web address] 03/03/2023, 11:21 AM

## 2023-03-03 NOTE — Plan of Care (Signed)

## 2023-03-04 DIAGNOSIS — D649 Anemia, unspecified: Secondary | ICD-10-CM | POA: Diagnosis not present

## 2023-03-04 DIAGNOSIS — R7401 Elevation of levels of liver transaminase levels: Secondary | ICD-10-CM | POA: Diagnosis not present

## 2023-03-04 DIAGNOSIS — G9341 Metabolic encephalopathy: Secondary | ICD-10-CM | POA: Diagnosis not present

## 2023-03-04 DIAGNOSIS — E1122 Type 2 diabetes mellitus with diabetic chronic kidney disease: Secondary | ICD-10-CM | POA: Diagnosis not present

## 2023-03-04 LAB — GLUCOSE, CAPILLARY
Glucose-Capillary: 117 mg/dL — ABNORMAL HIGH (ref 70–99)
Glucose-Capillary: 125 mg/dL — ABNORMAL HIGH (ref 70–99)
Glucose-Capillary: 154 mg/dL — ABNORMAL HIGH (ref 70–99)

## 2023-03-04 MED ORDER — FREE WATER: 120.0000 mL | Status: DC

## 2023-03-04 MED ORDER — FERROUS SULFATE 300 (60 FE) MG/5ML PO SOLN: 408.0000 mg | Freq: Every day | ORAL | Status: DC

## 2023-03-04 MED ORDER — PANTOPRAZOLE SODIUM 40 MG PO TBEC: 40.0000 mg | DELAYED_RELEASE_TABLET | Freq: Every day | ORAL | Status: DC

## 2023-03-04 MED ORDER — SULFAMETHOXAZOLE-TRIMETHOPRIM 800-160 MG PO TABS
1.0000 | ORAL_TABLET | Freq: Two times a day (BID) | ORAL | Status: AC
Start: 2023-03-04 — End: 2023-03-07

## 2023-03-04 NOTE — TOC Transition Note (Signed)
Transition of Care [facility]) - CM/SW Discharge Note   Patient Details  Name: Gina Kelley MRN: [id number] Date of Birth: 1936-04-16  Transition of Care [facility]) CM/SW Contact:  Baldemar Lenis, LCSW Phone Number: 03/04/2023, 10:45 AM   Clinical Narrative:   CSW notified by MD that patient is medically stable for return to [name] today, confirmed with [name] that patient can return. CSW notified daughter, Lesly Dukes, she is in agreement. Transport arranged with [facility] for next available.  Nurse to call report to [phone], room 305W.    Final next level of care: Skilled Nursing Facility Barriers to Discharge: Barriers Resolved   Patient Goals and CMS Choice CMS Medicare.gov Compare Post Acute Care list provided to:: Patient Represenative (must comment) Choice offered to / list presented to : Adult Children  Discharge Placement                Patient chooses bed at: [facility] Patient to be transferred to facility by: [facility] Name of family member notified: Lesly Dukes Patient and family notified of of transfer: 03/04/23  Discharge Plan and Services Additional resources added to the After Visit Summary for                                       Social Determinants of Health (SDOH) Interventions SDOH Screenings   Food Insecurity: No Food Insecurity (03/02/2023)  Housing: Patient Declined (03/02/2023)  Transportation Needs: No Transportation Needs (03/02/2023)  Utilities: Not At Risk (03/02/2023)  Depression (PHQ2-9): Low Risk  (11/15/2021)  Tobacco Use: Medium Risk (03/01/2023)     Readmission Risk Interventions     No data to display

## 2023-03-04 NOTE — Consult Note (Signed)
   [facility]  [facility] Inpatient Consult   03/04/2023  RIANA COLLINSON 09-Apr-1936 [id number]  [facility] [[facility]]  Accountable Care Organization [ACO] Patient: [facility]   Primary Care Provider:  Patient, No Pcp Per   Patient was reviewed for readmission less than 30 days with high risk.  Patient was screened for hospitalization and on behalf of [facility] /[facility] Care Coordination to assess for post hospital community care needs.  Patient is transitioning to a skilled nursing facility level of care for post hospital transition for a LTC bed.   Plan:   Need are to be met at the LTC [name] level of care and no [facility] Coordination needs are assessed..  For questions or referrals, please contact:   Charlesetta Shanks, RN BSN CCM [facility]  [phone] business mobile phone Toll free office [phone]  Fax number: [phone] [email or web address]

## 2023-03-04 NOTE — Discharge Summary (Signed)
Physician Discharge Summary  Gina Kelley WGN:[id number] DOB: Dec 11, 1935 DOA: 03/01/2023  PCP: Patient, No Pcp Per  Admit date: 03/01/2023 Discharge date: 03/04/2023  Admitted From: [name]  Discharge disposition: Skilled nursing facility  Recommendations for Outpatient Follow-Up:   Follow up with your primary care provider in one week.  Check CBC, CMP, magnesium in the next visit Recommend tube flushes with 120 mL every 4 hours.  Discharge Diagnosis:   Principal Problem:   Acute metabolic encephalopathy Active Problems:   Anemia, with occult fecal blood   Type 2 diabetes mellitus (HCC)   Transaminitis   History of intracranial hemorrhage   Seizure disorder (HCC)   Acute on chronic anemia   Dysphagia   Discharge Condition: Improved.  Diet recommendation: NPO tube feeding  Wound care: None.  Code status: Full.   History of Present Illness:   Gina Kelley is an 87 y.o. female with past medical history of HTN, DM, large spontaneous subdural hematoma and intracerebral hemorrhage in Jan 2024 requiring craniotomy, 2 weeks intubation, status post chronic indwelling Foley catheter and pain, history of GERD PUD, bed bound, lives in [name] LTC, recently readmitted for aspiration pneumonia 2 weeks ago and continued to the hospital from skilled nursing facility at this time with decreased mentation,facial droop, slurred speech and lethargic. In the ED, mild leukocytosis. UA with bacteria and WBCs.  CT head scan showed no acute abnormality but chronic small vessel disease.  Chest x-ray without any obvious infiltrate.  EKG showed sinus rhythm with first-degree AV block.   Hospital Course:   Following conditions were addressed during hospitalization as listed below,   Acute metabolic encephalopathy. CT head negative for acute intracranial abnormality.  Patient did not have  meningeal signs.  MRI brain ruled out stroke, showed expected sequelae of SDH/ICH from Jan 2024. TSH, ammonia  normal.  CXR without infiltrate.  WBC borderline high and urinalysis suggestive of infection.   Hgb down to 7.5s (from 9-10 g/dL) and FOBT+.    Vitamin B12 elevated at 4171.  TSH within normal range at 0.4.  Received on ampicillin for gram-negative Citrobacter UTI.  Encephalopathy has resolved and is back at baseline at this time.   Citrobacter UTI.  Urinary culture showing more than 100,000 Colonies of Citrobacter.  Fever has improved.  No leukocytosis.  At this time we will transition to Bactrim for the next 3 days to complete for total 5-day course.    Hypotension Improved at this time.  Lactate was normal.  Initially, received gentle IV fluids.  Blood pressure prior to discharge was 151/72   Acute on chronic anemia Fecal occult blood was positive.  Baseline hemoglobin around 9-10.  Hemoglobin up to 7.7 in the hospital.  Not a candidate for endoscopy.  Add PPI.  Transfuse for hemoglobin less than 7.  Iron profile pending.   Mild hyponatremia Resolved.  Latest sodium of 137.   Elevated LFTs. Possibly related to hypotension   AST ALT at 63 and 79 respectively.   improved.  Recommend checking LFT as outpatient in the next visit.   History of ICH/SDH s/p craniotomy earlier this year with resultant left-sided hemiparesis/dysphagia requiring PEG tube.  Continue tube feeds.  Keep NPO.   Seizure disorder Continue Keppra through PEG tube.   Type 2 diabetes mellitus. Well-controlled-recent hemoglobin A1c 5.8 on 02/14/2023.  Continue sliding scale and metformin at the facility.   History of bladder outlet obstruction,    Debility, deconditioning.  Patient is from skilled nursing facility.  Plan for skilled nursing facility on discharge.   Disposition.  At this time, patient is stable for disposition to skilled nursing facility.  Medical Consultants:   GI   Procedures:    None  Subjective:   Today, patient was seen and examined at bedside.  Patient feels okay.  Denies any  nausea,  vomiting, abdominal pain, fever or chills.  Discharge Exam:   Vitals:   03/04/23 0359 03/04/23 0753  BP: (!) 146/74 (!) 151/72  Pulse: 79 98  Resp: 16 14  Temp: 98.5 F (36.9 C) 98.1 F (36.7 C)  SpO2: 100% 90%   Vitals:   03/03/23 1938 03/04/23 0009 03/04/23 0359 03/04/23 0753  BP: 131/67 (!) 140/68 (!) 146/74 (!) 151/72  Pulse: 81 77 79 98  Resp: 18 16 16 14   Temp: 97.8 F (36.6 C) (!) 97.5 F (36.4 C) 98.5 F (36.9 C) 98.1 F (36.7 C)  TempSrc: Oral Oral  Oral  SpO2: 99% 100% 100% 90%  Weight:      Height:       Body mass index is 20.74 kg/m.  General: Alert awake, not in obvious distress, thinly built HENT: pupils equally reacting to light,  No scleral pallor or icterus noted. Oral mucosa is moist.  Chest:  Clear breath sounds.  y. No crackles or wheezes.  CVS: S1 &S2 heard. No murmur.  Regular rate and rhythm. Abdomen: Soft, nontender, nondistended.  Bowel sounds are heard.  PEG tube in place Extremities: No cyanosis, clubbing or edema.  Peripheral pulses are palpable. Psych: Alert, awake and oriented to place and month. CNS: Dysarthria noted. Left-sided residual weakness. Skin: Warm and dry.  No rashes noted.  The results of significant diagnostics from this hospitalization (including imaging, microbiology, ancillary and laboratory) are listed below for reference.     Diagnostic Studies:   MR BRAIN WO CONTRAST  Result Date: 03/02/2023 CLINICAL DATA:  87 year old female with altered mental status, neurologic deficit. History of right hemisphere intra-axial hemorrhage with subdural extension in January, craniotomy at that time. EXAM: MRI HEAD WITHOUT CONTRAST TECHNIQUE: Multiplanar, multiecho pulse sequences of the brain and surrounding structures were obtained without intravenous contrast. COMPARISON:  Previous head CTs 03/01/2023 and earlier. FINDINGS: Brain: Right hemisphere encephalomalacia and gliosis with superficial siderosis and some residual intra-axial  hemosiderin on SWI. Overlying craniotomy and mild ex vacuo enlargement of the right lateral ventricle. Blood product related susceptibility on DWI. No restricted diffusion or evidence of acute infarction. Evidence of Wallerian degeneration in the right deep gray nuclei, also the brainstem. Superimposed additional scattered chronic microhemorrhages in the brain, including clustered in the right occipital lobe. And some superficial siderosis there which is somewhat remote from the surgery site., no definite contralateral or posterior fossa siderosis. Chronic lacunar infarct of the left lentiform. Chronic lacunar infarcts in the central pons. Small chronic infarct in the left cerebellum. No midline shift, mass effect, evidence of mass lesion, IVH, ventriculomegaly,or acute intracranial hemorrhage. Cervicomedullary junction and pituitary are within normal limits. Vascular: Major intracranial vascular flow voids are preserved, the distal right vertebral artery appears dominant. There is evidence of intracranial atherosclerosis. Skull and upper cervical spine: Negative for age visible cervical spine. Visualized bone marrow signal is within normal limits. Previous right convexity craniotomy. Sinuses/Orbits: Postoperative changes to both globes. Paranasal Visualized paranasal sinuses and mastoids are stable and well aerated. Other: Grossly negative visible internal auditory structures. No acute orbit or scalp soft tissue finding. IMPRESSION: 1. No acute intracranial abnormality. 2. Sequelae of right  hemisphere hemorrhage and craniotomy with relatively extensive encephalomalacia, superficial siderosis, and Wallerian degeneration. Superimposed chronic small vessel disease in the left hemisphere, brainstem, to a lesser extent cerebellum. Additional chronic microhemorrhages are mostly in the right hemisphere. Still, amyloid angiopathy is difficult to exclude. Electronically Signed   By: Odessa Fleming M.D.   On: 03/02/2023 06:58    CT Head Wo Contrast  Result Date: 03/01/2023 CLINICAL DATA:  Mental status change, unknown cause. Low blood pressures. EXAM: CT HEAD WITHOUT CONTRAST TECHNIQUE: Contiguous axial images were obtained from the base of the skull through the vertex without intravenous contrast. RADIATION DOSE REDUCTION: This exam was performed according to the departmental dose-optimization program which includes automated exposure control, adjustment of the mA and/or kV according to patient size and/or use of iterative reconstruction technique. COMPARISON:  CT head 02/13/2023 FINDINGS: Brain: No intracranial hemorrhage, mass effect, or evidence of acute infarct. No hydrocephalus. No extra-axial fluid collection. Age-commensurate cerebral atrophy and ill-defined hypoattenuation within the cerebral white matter consistent with chronic small vessel ischemic disease. Unchanged right frontal encephalomalacia. Remote left basal ganglia infarct. Vascular: No hyperdense vessel. Intracranial arterial calcification. Skull: No fracture or focal lesion.  Right craniotomy. Sinuses/Orbits: No acute finding. Paranasal sinuses and mastoid air cells are well aerated. Other: None. IMPRESSION: 1. No acute intracranial abnormality. 2. Chronic small vessel ischemic disease and remote infarcts as described. Electronically Signed   By: Minerva Fester M.D.   On: 03/01/2023 20:24   DG Chest Port 1 View  Result Date: 03/01/2023 CLINICAL DATA:  Cough, weakness EXAM: PORTABLE CHEST 1 VIEW COMPARISON:  Chest radiograph dated 02/13/2023. FINDINGS: The heart size and mediastinal contours are within normal limits. Vascular calcifications are seen in the aortic arch. The right hemidiaphragm is elevated relative to the left. Both lungs are clear. Degenerative changes are seen in the spine. IMPRESSION: No active cardiopulmonary disease. Electronically Signed   By: Romona Curls M.D.   On: 03/01/2023 19:13     Labs:   Basic Metabolic Panel: Recent Labs   Lab 03/01/23 1800 03/02/23 0417 03/03/23 0739  NA 134* 135 137  K 4.2 3.6 3.5  CL 93* 96* 98  CO2 31 31 32  GLUCOSE 122* 77 110*  BUN 27* 26* 18  CREATININE 0.90 0.78 0.52  CALCIUM 8.8* 9.2 8.7*   GFR Estimated Creatinine Clearance: 42.5 mL/min (by C-G formula based on SCr of 0.52 mg/dL). Liver Function Tests: Recent Labs  Lab 03/01/23 1800 03/02/23 0417  AST 76* 63*  ALT 88* 79*  ALKPHOS 98 73  BILITOT 0.5 0.7  PROT 6.4* 6.6  ALBUMIN 2.4* 2.4*   No results for input(s): "LIPASE", "AMYLASE" in the last 168 hours. Recent Labs  Lab 03/01/23 2337  AMMONIA 15   Coagulation profile No results for input(s): "INR", "PROTIME" in the last 168 hours.  CBC: Recent Labs  Lab 03/01/23 1800 03/02/23 0417 03/03/23 0739  WBC 11.0* 9.7 9.7  NEUTROABS 9.2*  --   --   HGB 7.7* 7.7* 7.5*  HCT 23.9* 24.0* 22.7*  MCV 100.0 96.8 97.8  PLT 385 351 329   Cardiac Enzymes: No results for input(s): "CKTOTAL", "CKMB", "CKMBINDEX", "TROPONINI" in the last 168 hours. BNP: Invalid input(s): "POCBNP" CBG: Recent Labs  Lab 03/03/23 1708 03/03/23 1935 03/04/23 0000 03/04/23 0356 03/04/23 0754  GLUCAP 136* 141* 117* 154* 125*   D-Dimer No results for input(s): "DDIMER" in the last 72 hours. Hgb A1c No results for input(s): "HGBA1C" in the last 72 hours. Lipid Profile  No results for input(s): "CHOL", "HDL", "LDLCALC", "TRIG", "CHOLHDL", "LDLDIRECT" in the last 72 hours. Thyroid function studies Recent Labs    03/01/23 2337  TSH 0.456   Anemia work up Recent Labs    03/01/23 2337 03/03/23 0739  VITAMINB12 4,171*  --   FERRITIN  --  96  TIBC  --  280  IRON  --  15*   Microbiology Recent Results (from the past 240 hour(s))  Culture, blood (routine x 2)     Status: None (Preliminary result)   Collection Time: 03/01/23  6:00 PM   Specimen: BLOOD  Result Value Ref Range Status   Specimen Description BLOOD SITE NOT SPECIFIED  Final   Special Requests   Final     BOTTLES DRAWN AEROBIC AND ANAEROBIC Blood Culture results may not be optimal due to an excessive volume of blood received in culture bottles   Culture   Final    NO GROWTH 3 DAYS Performed at [facility], [address] [place], [place] [address]    Report Status PENDING  Incomplete  SARS Coronavirus 2 by RT PCR (hospital order, performed in [facility] hospital lab) *cepheid single result test* Peripheral     Status: None   Collection Time: 03/01/23  6:04 PM   Specimen: Peripheral; Nasal Swab  Result Value Ref Range Status   SARS Coronavirus 2 by RT PCR NEGATIVE NEGATIVE Final    Comment: Performed at [facility], [address], [place], [place] [address]  Culture, blood (routine x 2)     Status: None (Preliminary result)   Collection Time: 03/01/23  6:42 PM   Specimen: BLOOD  Result Value Ref Range Status   Specimen Description BLOOD BLOOD RIGHT HAND  Final   Special Requests   Final    BOTTLES DRAWN AEROBIC AND ANAEROBIC Blood Culture adequate volume   Culture   Final    NO GROWTH 3 DAYS Performed at [facility], [address], [place], [place] [address]    Report Status PENDING  Incomplete  Urine Culture     Status: Abnormal (Preliminary result)   Collection Time: 03/02/23  7:38 AM   Specimen: Urine, Clean Catch  Result Value Ref Range Status   Specimen Description URINE, CLEAN CATCH  Final   Special Requests NONE  Final   Culture >=100,000 COLONIES/mL CITROBACTER YOUNGAE (A)  Final   Report Status PENDING  Incomplete   Organism ID, Bacteria CITROBACTER YOUNGAE (A)  Final      Susceptibility   Citrobacter youngae - MIC*    CEFEPIME 0.5 SENSITIVE Sensitive     CEFTRIAXONE >=64 RESISTANT Resistant     CIPROFLOXACIN <=0.25 SENSITIVE Sensitive     GENTAMICIN <=1 SENSITIVE Sensitive     IMIPENEM <=0.25 SENSITIVE Sensitive     NITROFURANTOIN 32 SENSITIVE Sensitive     TRIMETH/SULFA <=20 SENSITIVE Sensitive     PIP/TAZO Value in next row Intermediate       64 INTERMEDIATEPerformed at [facility], [address] [place], [place] [address]    * >=100,000 COLONIES/mL Vicenta Aly     Discharge Instructions:   Discharge Instructions     Call MD for:  persistant nausea and vomiting   Complete by: As directed    Call MD for:  severe uncontrolled pain   Complete by: As directed    Call MD for:  temperature >100.4   Complete by: As directed    Discharge instructions  Complete by: As directed    Follow up with your primary care provider in one week. Complete the course of antibioitc. Seek medical attention for worsening symptoms.   Increase activity slowly   Complete by: As directed       Allergies as of 03/04/2023       Reactions   Cozaar [losartan] Swelling   Angioedema    Periogard [chlorhexidine Gluconate] Swelling, Other (See Comments)   Skin irritation Eye/tongue/lip swelling   Pork Allergy Other (See Comments)   Unknown reaction   Vasotec [enalapril] Cough        Medication List     STOP taking these medications    diclofenac Sodium 1 % Gel Commonly known as: VOLTAREN       TAKE these medications    acetaminophen 500 MG tablet Commonly known as: TYLENOL Place 500-1,000 mg into feeding tube See admin instructions. 1000 mg via G-tube twice daily and 500 mg four times daily as needed for pain   amLODipine 10 MG tablet Commonly known as: NORVASC Place 1 tablet (10 mg total) into feeding tube daily.   ascorbic acid 500 MG tablet Commonly known as: VITAMIN C Place 1 tablet (500 mg total) into feeding tube daily.   atorvastatin 80 MG tablet Commonly known as: LIPITOR Place 1 tablet (80 mg total) into feeding tube daily.   carvedilol 6.25 MG tablet Commonly known as: COREG Place 1 tablet (6.25 mg total) into feeding tube 2 (two) times daily with a meal.   ezetimibe 10 MG tablet Commonly known as: ZETIA Place 1 tablet (10 mg total) into feeding tube daily.   famotidine 20 MG  tablet Commonly known as: PEPCID Place 1 tablet (20 mg total) into feeding tube 2 (two) times daily.   feeding supplement (GLUCERNA 1.5 CAL) Liqd Place 1,000 mLs into feeding tube daily. What changed: how much to take   ferrous sulfate 300 (60 Fe) MG/5ML syrup Place 6.8 mLs (408 mg total) into feeding tube daily.   FLUoxetine 20 MG capsule Commonly known as: PROZAC Place 1 capsule (20 mg total) into feeding tube at bedtime.   free water Soln Place 120 mLs into feeding tube every 4 (four) hours.   gabapentin 250 MG/5ML solution Commonly known as: NEURONTIN Place 2 mLs (100 mg total) into feeding tube every 8 (eight) hours.   guaiFENesin 100 MG/5ML liquid Commonly known as: ROBITUSSIN Place 200 mg into feeding tube 4 (four) times daily.   insulin lispro 100 UNIT/ML KwikPen Commonly known as: HUMALOG Inject 0-15 Units into the skin See admin instructions. Inject 0-15 units 4 times daily per sliding scale: < 69 : implement hypoglycemic protocol 70-150 : 0 units 151-200 : 3 units 201-250 : 6 units 251-300 : 9 units 301-350 : 12 units 351-400 : 16 units > 401 : call MD   ipratropium-albuterol 0.5-2.5 (3) MG/3ML Soln Commonly known as: DUONEB Take 3 mLs by nebulization every 4 (four) hours as needed.   l-methylfolate-B6-B12 3-35-2 MG Tabs tablet Commonly known as: METANX Place 1 tablet into feeding tube daily.   levETIRAcetam 100 MG/ML solution Commonly known as: Keppra Place 5 mLs (500 mg total) into feeding tube 2 (two) times daily.   lidocaine 4 % Place 1 patch onto the skin daily.   metFORMIN 500 MG tablet Commonly known as: GLUCOPHAGE Place 1 tablet (500 mg total) into feeding tube 2 (two) times daily with a meal. What changed:  how much to take when to take this   ondansetron  4 MG tablet Commonly known as: ZOFRAN Take 4 mg by mouth every 8 (eight) hours as needed for nausea or vomiting.   pantoprazole 40 MG tablet Commonly known as: Protonix Take 1  tablet (40 mg total) by mouth daily.   PreserVision AREDS 2 Chew Give 1 each by tube 2 (two) times daily.   sulfamethoxazole-trimethoprim 800-160 MG tablet Commonly known as: BACTRIM DS Take 1 tablet by mouth 2 (two) times daily for 3 days.          Time coordinating discharge: 39 minutes  Signed:  Samanth Mirkin  [facility] 03/04/2023, 9:48 AM

## 2023-03-04 NOTE — Plan of Care (Signed)
  Problem: Coping: Goal: Ability to adjust to condition or change in health will improve Outcome: Progressing   

## 2023-03-04 NOTE — Care Management Important Message (Signed)
Important Message  Patient Details  Name: Gina Kelley MRN: [id number] Date of Birth: Jul 27, 1935   Medicare Important Message Given:  Yes     [name] Stefan Church 03/04/2023, 4:26 PM

## 2023-03-04 NOTE — NC FL2 (Addendum)
[place] MEDICAID FL2 LEVEL OF CARE FORM     IDENTIFICATION  Patient Name: Gina Kelley Birthdate: 29-Jul-1935 Sex: female Admission Date (Current Location): 03/01/2023  [facility] and [place] Number:  Producer, television/film/video and Address:  [facility] [place], [address], [place], [place] [address]      Provider Number: [id number]  Attending Physician Name and Address:  Joycelyn Das, MD  Relative Name and Phone Number:       Current Level of Care: Hospital Recommended Level of Care: Skilled Nursing Facility Prior Approval Number:    Date Approved/Denied:   PASRR Number:    Discharge Plan: [name]    Current Diagnoses: Patient Active Problem List   Diagnosis Date Noted   Acute on chronic anemia 03/02/2023   Dysphagia 03/02/2023   Acute metabolic encephalopathy 03/01/2023   Transaminitis 03/01/2023   History of intracranial hemorrhage 03/01/2023   Seizure disorder (HCC) 03/01/2023   Multifocal pneumonia 02/13/2023   Sepsis with acute renal failure without septic shock (HCC) 02/13/2023   Mild malnutrition (HCC) 08/07/2022   Subdural hematoma (HCC) 08/02/2022   Pressure injury of skin 07/22/2022   Ventilator dependence (HCC) 07/11/2022   Malnutrition of moderate degree 07/10/2022   S/P craniotomy 07/08/2022   ICH (intracerebral hemorrhage) (HCC) 07/08/2022   Need for prophylactic vaccination against Streptococcus pneumoniae (pneumococcus) 11/15/2021   Prolapse of female pelvic organs 05/15/2020   Osteopenia of right femoral neck 10/20/2016   Hypertensive retinopathy of both eyes, grade 1 05/15/2016   Gastroesophageal reflux disease without esophagitis 02/18/2013   Healthcare maintenance 12/04/2011   Anemia, with occult fecal blood 07/08/2006   Type 2 diabetes mellitus (HCC) 04/14/2006   Hyperlipidemia 04/14/2006   Essential hypertension 04/14/2006    Orientation RESPIRATION BLADDER Height & Weight     Self, Place  Normal Incontinent Weight:  117 lb 1 oz (53.1 kg) Height:  5' 2.99" (160 cm)  BEHAVIORAL SYMPTOMS/MOOD NEUROLOGICAL BOWEL NUTRITION STATUS    Convulsions/Seizures Incontinent Feeding tube  AMBULATORY STATUS COMMUNICATION OF NEEDS Skin   Extensive Assist Verbally Normal                       Personal Care Assistance Level of Assistance  Bathing, Feeding, Dressing Bathing Assistance: Maximum assistance Feeding assistance: Maximum assistance Dressing Assistance: Maximum assistance     Functional Limitations Info  Speech     Speech Info: Impaired (Dysarthria)    SPECIAL CARE FACTORS FREQUENCY                       Contractures Contractures Info: Not present    Additional Factors Info  Code Status, Allergies Code Status Info: Full Allergies Info: Cozaar (Losartan), Periogard (Chlorhexidine Gluconate), Pork Allergy, Vasotec (Enalapril)           Current Medications (03/04/2023):  This is the current hospital active medication list Current Facility-Administered Medications  Medication Dose Route Frequency Provider Last Rate Last Admin   acetaminophen (TYLENOL) tablet 650 mg  650 mg Per Tube Q6H PRN Hillary Bow, DO   650 mg at 03/02/23 2107   ampicillin (OMNIPEN) 1 g in sodium chloride 0.9 % 100 mL IVPB  1 g Intravenous Q6H Danford, Earl Lites, MD 300 mL/hr at 03/04/23 0515 1 g at 03/04/23 0515   ezetimibe (ZETIA) tablet 10 mg  10 mg Per Tube Daily Alberteen Sam, MD   10 mg at 03/03/23 0818   feeding supplement (GLUCERNA 1.5  CAL) liquid  40 mL/hr Per Tube Q24H Alberteen Sam, MD   40 mL/hr at 03/03/23 0851   ferrous sulfate 300 (60 Fe) MG/5ML syrup 408 mg  408 mg Per Tube Daily Alberteen Sam, MD   408 mg at 03/03/23 0817   FLUoxetine (PROZAC) capsule 20 mg  20 mg Per Tube QHS Alberteen Sam, MD   20 mg at 03/03/23 2139   free water 120 mL  120 mL Per Tube Q4H Danford, Earl Lites, MD   120 mL at 03/04/23 0441   gabapentin (NEURONTIN) 250 MG/5ML solution  100 mg  100 mg Per Tube Q8H Danford, Earl Lites, MD   100 mg at 03/04/23 0516   insulin aspart (novoLOG) injection 0-9 Units  0-9 Units Subcutaneous Q4H John Giovanni, MD   2 Units at 03/04/23 0421   levETIRAcetam (KEPPRA) 100 MG/ML solution 500 mg  500 mg Per Tube BID Alberteen Sam, MD   500 mg at 03/03/23 2139   multivitamin liquid 15 mL  15 mL Per Tube Daily Danford, Earl Lites, MD   15 mL at 03/03/23 0817   pantoprazole (PROTONIX) injection 40 mg  40 mg Intravenous Daily Pokhrel, Laxman, MD   40 mg at 03/03/23 1136     Discharge Medications: Please see discharge summary for a list of discharge medications.  Relevant Imaging Results:  Relevant Lab Results:   Additional Information SSN: [id number]  Antion Felipa Emory, Student-Social Work

## 2023-03-05 DIAGNOSIS — R2681 Unsteadiness on feet: Secondary | ICD-10-CM | POA: Diagnosis not present

## 2023-03-05 DIAGNOSIS — M6281 Muscle weakness (generalized): Secondary | ICD-10-CM | POA: Diagnosis not present

## 2023-03-05 DIAGNOSIS — G9341 Metabolic encephalopathy: Secondary | ICD-10-CM | POA: Diagnosis not present

## 2023-03-06 DIAGNOSIS — E1165 Type 2 diabetes mellitus with hyperglycemia: Secondary | ICD-10-CM | POA: Diagnosis not present

## 2023-03-06 DIAGNOSIS — D6489 Other specified anemias: Secondary | ICD-10-CM | POA: Diagnosis not present

## 2023-03-06 DIAGNOSIS — I10 Essential (primary) hypertension: Secondary | ICD-10-CM | POA: Diagnosis not present

## 2023-03-06 DIAGNOSIS — N390 Urinary tract infection, site not specified: Secondary | ICD-10-CM | POA: Diagnosis not present

## 2023-03-06 LAB — CULTURE, BLOOD (ROUTINE X 2)
Culture: NO GROWTH
Culture: NO GROWTH
Special Requests: ADEQUATE

## 2023-03-09 ENCOUNTER — Emergency Department (HOSPITAL_COMMUNITY): Payer: Medicare HMO

## 2023-03-09 ENCOUNTER — Encounter (HOSPITAL_COMMUNITY): Payer: Self-pay | Admitting: Internal Medicine

## 2023-03-09 ENCOUNTER — Inpatient Hospital Stay (HOSPITAL_COMMUNITY)
Admission: EM | Admit: 2023-03-09 | Discharge: 2023-03-16 | DRG: 871 | Disposition: A | Discharge status: Skilled Nursing Facility | Payer: Medicare HMO | Source: Skilled Nursing Facility | Attending: Internal Medicine | Admitting: Internal Medicine

## 2023-03-09 ENCOUNTER — Other Ambulatory Visit: Payer: Self-pay

## 2023-03-09 DIAGNOSIS — G40909 Epilepsy, unspecified, not intractable, without status epilepticus: Secondary | ICD-10-CM | POA: Diagnosis present

## 2023-03-09 DIAGNOSIS — D259 Leiomyoma of uterus, unspecified: Secondary | ICD-10-CM | POA: Diagnosis not present

## 2023-03-09 DIAGNOSIS — I10 Essential (primary) hypertension: Secondary | ICD-10-CM | POA: Diagnosis not present

## 2023-03-09 DIAGNOSIS — I69320 Aphasia following cerebral infarction: Secondary | ICD-10-CM | POA: Diagnosis not present

## 2023-03-09 DIAGNOSIS — J189 Pneumonia, unspecified organism: Secondary | ICD-10-CM

## 2023-03-09 DIAGNOSIS — Z79899 Other long term (current) drug therapy: Secondary | ICD-10-CM

## 2023-03-09 DIAGNOSIS — R1310 Dysphagia, unspecified: Secondary | ICD-10-CM | POA: Diagnosis present

## 2023-03-09 DIAGNOSIS — J984 Other disorders of lung: Secondary | ICD-10-CM | POA: Diagnosis not present

## 2023-03-09 DIAGNOSIS — Z7189 Other specified counseling: Secondary | ICD-10-CM | POA: Diagnosis not present

## 2023-03-09 DIAGNOSIS — D631 Anemia in chronic kidney disease: Secondary | ICD-10-CM | POA: Diagnosis not present

## 2023-03-09 DIAGNOSIS — R6520 Severe sepsis without septic shock: Secondary | ICD-10-CM | POA: Diagnosis present

## 2023-03-09 DIAGNOSIS — R590 Localized enlarged lymph nodes: Secondary | ICD-10-CM | POA: Diagnosis not present

## 2023-03-09 DIAGNOSIS — Z9981 Dependence on supplemental oxygen: Secondary | ICD-10-CM

## 2023-03-09 DIAGNOSIS — K219 Gastro-esophageal reflux disease without esophagitis: Secondary | ICD-10-CM | POA: Diagnosis present

## 2023-03-09 DIAGNOSIS — E1122 Type 2 diabetes mellitus with diabetic chronic kidney disease: Secondary | ICD-10-CM | POA: Diagnosis present

## 2023-03-09 DIAGNOSIS — Z8489 Family history of other specified conditions: Secondary | ICD-10-CM

## 2023-03-09 DIAGNOSIS — Z8679 Personal history of other diseases of the circulatory system: Secondary | ICD-10-CM

## 2023-03-09 DIAGNOSIS — R1111 Vomiting without nausea: Secondary | ICD-10-CM | POA: Diagnosis not present

## 2023-03-09 DIAGNOSIS — Z8711 Personal history of peptic ulcer disease: Secondary | ICD-10-CM

## 2023-03-09 DIAGNOSIS — Z1152 Encounter for screening for COVID-19: Secondary | ICD-10-CM

## 2023-03-09 DIAGNOSIS — J9621 Acute and chronic respiratory failure with hypoxia: Secondary | ICD-10-CM | POA: Diagnosis not present

## 2023-03-09 DIAGNOSIS — I69354 Hemiplegia and hemiparesis following cerebral infarction affecting left non-dominant side: Secondary | ICD-10-CM

## 2023-03-09 DIAGNOSIS — E876 Hypokalemia: Secondary | ICD-10-CM | POA: Diagnosis present

## 2023-03-09 DIAGNOSIS — Z823 Family history of stroke: Secondary | ICD-10-CM

## 2023-03-09 DIAGNOSIS — G9341 Metabolic encephalopathy: Secondary | ICD-10-CM | POA: Diagnosis present

## 2023-03-09 DIAGNOSIS — R4182 Altered mental status, unspecified: Secondary | ICD-10-CM | POA: Diagnosis not present

## 2023-03-09 DIAGNOSIS — I2489 Other forms of acute ischemic heart disease: Secondary | ICD-10-CM | POA: Diagnosis present

## 2023-03-09 DIAGNOSIS — Z87891 Personal history of nicotine dependence: Secondary | ICD-10-CM

## 2023-03-09 DIAGNOSIS — E781 Pure hyperglyceridemia: Secondary | ICD-10-CM

## 2023-03-09 DIAGNOSIS — J690 Pneumonitis due to inhalation of food and vomit: Secondary | ICD-10-CM | POA: Diagnosis not present

## 2023-03-09 DIAGNOSIS — Z9889 Other specified postprocedural states: Secondary | ICD-10-CM

## 2023-03-09 DIAGNOSIS — J9611 Chronic respiratory failure with hypoxia: Secondary | ICD-10-CM | POA: Diagnosis not present

## 2023-03-09 DIAGNOSIS — E119 Type 2 diabetes mellitus without complications: Secondary | ICD-10-CM

## 2023-03-09 DIAGNOSIS — Z515 Encounter for palliative care: Secondary | ICD-10-CM

## 2023-03-09 DIAGNOSIS — Z794 Long term (current) use of insulin: Secondary | ICD-10-CM

## 2023-03-09 DIAGNOSIS — Z7984 Long term (current) use of oral hypoglycemic drugs: Secondary | ICD-10-CM

## 2023-03-09 DIAGNOSIS — R7401 Elevation of levels of liver transaminase levels: Secondary | ICD-10-CM | POA: Diagnosis present

## 2023-03-09 DIAGNOSIS — R059 Cough, unspecified: Secondary | ICD-10-CM | POA: Diagnosis not present

## 2023-03-09 DIAGNOSIS — R569 Unspecified convulsions: Secondary | ICD-10-CM

## 2023-03-09 DIAGNOSIS — Z888 Allergy status to other drugs, medicaments and biological substances status: Secondary | ICD-10-CM

## 2023-03-09 DIAGNOSIS — Z803 Family history of malignant neoplasm of breast: Secondary | ICD-10-CM

## 2023-03-09 DIAGNOSIS — Z7401 Bed confinement status: Secondary | ICD-10-CM

## 2023-03-09 DIAGNOSIS — E872 Acidosis, unspecified: Secondary | ICD-10-CM | POA: Diagnosis not present

## 2023-03-09 DIAGNOSIS — F32A Depression, unspecified: Secondary | ICD-10-CM | POA: Diagnosis present

## 2023-03-09 DIAGNOSIS — J168 Pneumonia due to other specified infectious organisms: Secondary | ICD-10-CM | POA: Diagnosis not present

## 2023-03-09 DIAGNOSIS — Z931 Gastrostomy status: Secondary | ICD-10-CM | POA: Diagnosis not present

## 2023-03-09 DIAGNOSIS — Z825 Family history of asthma and other chronic lower respiratory diseases: Secondary | ICD-10-CM

## 2023-03-09 DIAGNOSIS — R627 Adult failure to thrive: Secondary | ICD-10-CM | POA: Diagnosis present

## 2023-03-09 DIAGNOSIS — N181 Chronic kidney disease, stage 1: Secondary | ICD-10-CM | POA: Diagnosis present

## 2023-03-09 DIAGNOSIS — Z91014 Allergy to mammalian meats: Secondary | ICD-10-CM

## 2023-03-09 DIAGNOSIS — R000 Tachycardia, unspecified: Secondary | ICD-10-CM | POA: Diagnosis not present

## 2023-03-09 DIAGNOSIS — I129 Hypertensive chronic kidney disease with stage 1 through stage 4 chronic kidney disease, or unspecified chronic kidney disease: Secondary | ICD-10-CM | POA: Diagnosis present

## 2023-03-09 DIAGNOSIS — R918 Other nonspecific abnormal finding of lung field: Secondary | ICD-10-CM | POA: Diagnosis not present

## 2023-03-09 DIAGNOSIS — A419 Sepsis, unspecified organism: Principal | ICD-10-CM | POA: Diagnosis present

## 2023-03-09 DIAGNOSIS — R1110 Vomiting, unspecified: Secondary | ICD-10-CM | POA: Diagnosis not present

## 2023-03-09 DIAGNOSIS — Z8249 Family history of ischemic heart disease and other diseases of the circulatory system: Secondary | ICD-10-CM

## 2023-03-09 DIAGNOSIS — I611 Nontraumatic intracerebral hemorrhage in hemisphere, cortical: Secondary | ICD-10-CM | POA: Diagnosis not present

## 2023-03-09 DIAGNOSIS — D649 Anemia, unspecified: Secondary | ICD-10-CM | POA: Diagnosis not present

## 2023-03-09 DIAGNOSIS — R7989 Other specified abnormal findings of blood chemistry: Secondary | ICD-10-CM | POA: Diagnosis present

## 2023-03-09 DIAGNOSIS — R0603 Acute respiratory distress: Secondary | ICD-10-CM | POA: Diagnosis not present

## 2023-03-09 DIAGNOSIS — Z833 Family history of diabetes mellitus: Secondary | ICD-10-CM

## 2023-03-09 DIAGNOSIS — E785 Hyperlipidemia, unspecified: Secondary | ICD-10-CM | POA: Diagnosis present

## 2023-03-09 LAB — COMPREHENSIVE METABOLIC PANEL
ALT: 212 U/L — ABNORMAL HIGH (ref 0–44)
AST: 209 U/L — ABNORMAL HIGH (ref 15–41)
Albumin: 2.7 g/dL — ABNORMAL LOW (ref 3.5–5.0)
Alkaline Phosphatase: 123 U/L (ref 38–126)
Anion gap: 11 (ref 5–15)
BUN: 28 mg/dL — ABNORMAL HIGH (ref 8–23)
CO2: 25 mmol/L (ref 22–32)
Calcium: 8.9 mg/dL (ref 8.9–10.3)
Chloride: 97 mmol/L — ABNORMAL LOW (ref 98–111)
Creatinine, Ser: 0.94 mg/dL (ref 0.44–1.00)
GFR, Estimated: 59 mL/min — ABNORMAL LOW (ref >60)
Glucose, Bld: 268 mg/dL — ABNORMAL HIGH (ref 70–99)
Potassium: 4 mmol/L (ref 3.5–5.1)
Sodium: 133 mmol/L — ABNORMAL LOW (ref 135–145)
Total Bilirubin: 0.4 mg/dL (ref 0.3–1.2)
Total Protein: 7.6 g/dL (ref 6.5–8.1)

## 2023-03-09 LAB — GLUCOSE, CAPILLARY
Glucose-Capillary: 194 mg/dL — ABNORMAL HIGH (ref 70–99)
Glucose-Capillary: 188 mg/dL — ABNORMAL HIGH (ref 70–99)
Glucose-Capillary: 174 mg/dL — ABNORMAL HIGH (ref 70–99)

## 2023-03-09 LAB — I-STAT VENOUS BLOOD GAS, ED
Acid-Base Excess: 0 mmol/L (ref 0.0–2.0)
Bicarbonate: 24.7 mmol/L (ref 20.0–28.0)
Calcium, Ion: 1.11 mmol/L — ABNORMAL LOW (ref 1.15–1.40)
HCT: 30 % — ABNORMAL LOW (ref 36.0–46.0)
Hemoglobin: 10.2 g/dL — ABNORMAL LOW (ref 12.0–15.0)
O2 Saturation: 99 %
Potassium: 4 mmol/L (ref 3.5–5.1)
Sodium: 138 mmol/L (ref 135–145)
TCO2: 26 mmol/L (ref 22–32)
pCO2, Ven: 40.2 mmHg — ABNORMAL LOW (ref 44–60)
pH, Ven: 7.397 (ref 7.25–7.43)
pO2, Ven: 137 mmHg — ABNORMAL HIGH (ref 32–45)

## 2023-03-09 LAB — URINALYSIS, W/ REFLEX TO CULTURE (INFECTION SUSPECTED)
Bacteria, UA: NONE SEEN
Bilirubin Urine: NEGATIVE
Glucose, UA: NEGATIVE mg/dL
Hgb urine dipstick: NEGATIVE
Ketones, ur: NEGATIVE mg/dL
Leukocytes,Ua: NEGATIVE
Nitrite: NEGATIVE
Protein, ur: 100 mg/dL — AB
Specific Gravity, Urine: 1.036 — ABNORMAL HIGH (ref 1.005–1.030)
pH: 5 (ref 5.0–8.0)

## 2023-03-09 LAB — I-STAT CG4 LACTIC ACID, ED
Lactic Acid, Venous: 5.4 mmol/L (ref 0.5–1.9)
Lactic Acid, Venous: 3.8 mmol/L (ref 0.5–1.9)
Lactic Acid, Venous: 4 mmol/L (ref 0.5–1.9)
Lactic Acid, Venous: 3.6 mmol/L (ref 0.5–1.9)

## 2023-03-09 LAB — CBC WITH DIFFERENTIAL/PLATELET
Abs Immature Granulocytes: 0.08 10*3/uL — ABNORMAL HIGH (ref 0.00–0.07)
Basophils Absolute: 0 10*3/uL (ref 0.0–0.1)
Basophils Relative: 0 %
Eosinophils Absolute: 0 10*3/uL (ref 0.0–0.5)
Eosinophils Relative: 0 %
HCT: 30.7 % — ABNORMAL LOW (ref 36.0–46.0)
Hemoglobin: 9.5 g/dL — ABNORMAL LOW (ref 12.0–15.0)
Immature Granulocytes: 0 %
Lymphocytes Relative: 2 %
Lymphs Abs: 0.4 10*3/uL — ABNORMAL LOW (ref 0.7–4.0)
MCH: 30.4 pg (ref 26.0–34.0)
MCHC: 30.9 g/dL (ref 30.0–36.0)
MCV: 98.4 fL (ref 80.0–100.0)
Monocytes Absolute: 1 10*3/uL (ref 0.1–1.0)
Monocytes Relative: 5 %
Neutro Abs: 18.1 10*3/uL — ABNORMAL HIGH (ref 1.7–7.7)
Neutrophils Relative %: 93 %
Platelets: 461 10*3/uL — ABNORMAL HIGH (ref 150–400)
RBC: 3.12 MIL/uL — ABNORMAL LOW (ref 3.87–5.11)
RDW: 16.6 % — ABNORMAL HIGH (ref 11.5–15.5)
WBC: 19.6 10*3/uL — ABNORMAL HIGH (ref 4.0–10.5)
nRBC: 0 % (ref 0.0–0.2)

## 2023-03-09 LAB — RESP PANEL BY RT-PCR (RSV, FLU A&B, COVID)  RVPGX2
Influenza A by PCR: NEGATIVE
Influenza B by PCR: NEGATIVE
Resp Syncytial Virus by PCR: NEGATIVE
SARS Coronavirus 2 by RT PCR: NEGATIVE

## 2023-03-09 LAB — MRSA NEXT GEN BY PCR, NASAL: MRSA by PCR Next Gen: NOT DETECTED

## 2023-03-09 LAB — LACTIC ACID, PLASMA: Lactic Acid, Venous: 3 mmol/L (ref 0.5–1.9)

## 2023-03-09 LAB — TROPONIN I (HIGH SENSITIVITY)
Troponin I (High Sensitivity): 168 ng/L (ref <18)
Troponin I (High Sensitivity): 160 ng/L (ref <18)

## 2023-03-09 LAB — BRAIN NATRIURETIC PEPTIDE: B Natriuretic Peptide: 235.4 pg/mL — ABNORMAL HIGH (ref 0.0–100.0)

## 2023-03-09 LAB — LIPASE, BLOOD: Lipase: 92 U/L — ABNORMAL HIGH (ref 11–51)

## 2023-03-09 MED ORDER — CARVEDILOL 6.25 MG PO TABS
6.2500 mg | ORAL_TABLET | Freq: Two times a day (BID) | ORAL | Status: DC
  Administered 2023-03-09 – 2023-03-16 (×14): 6.25 mg
  Filled 2023-03-09 (×15): qty 1

## 2023-03-09 MED ORDER — VANCOMYCIN HCL IN DEXTROSE 1-5 GM/200ML-% IV SOLN
1000.0000 mg | Freq: Once | INTRAVENOUS | Status: AC
  Administered 2023-03-09: 1000 mg via INTRAVENOUS
  Filled 2023-03-09: qty 200

## 2023-03-09 MED ORDER — SODIUM CHLORIDE 0.9 % IV SOLN
2.0000 g | Freq: Once | INTRAVENOUS | Status: AC
  Administered 2023-03-09: 2 g via INTRAVENOUS
  Filled 2023-03-09: qty 12.5

## 2023-03-09 MED ORDER — SODIUM CHLORIDE 0.9% FLUSH
3.0000 mL | Freq: Two times a day (BID) | INTRAVENOUS | Status: DC
  Administered 2023-03-09 – 2023-03-15 (×9): 3 mL via INTRAVENOUS

## 2023-03-09 MED ORDER — FERROUS SULFATE 300 (60 FE) MG/5ML PO SOLN
300.0000 mg | Freq: Every day | ORAL | Status: DC
  Administered 2023-03-09 – 2023-03-16 (×8): 300 mg
  Filled 2023-03-09 (×9): qty 5

## 2023-03-09 MED ORDER — GABAPENTIN 250 MG/5ML PO SOLN
100.0000 mg | Freq: Three times a day (TID) | ORAL | Status: DC
  Administered 2023-03-09 – 2023-03-16 (×21): 100 mg
  Filled 2023-03-09 (×23): qty 2

## 2023-03-09 MED ORDER — TRIMETHOBENZAMIDE HCL 100 MG/ML IM SOLN: 200.0000 mg | Freq: Four times a day (QID) | INTRAMUSCULAR | Status: DC | PRN

## 2023-03-09 MED ORDER — INFLUENZA VAC A&B SURF ANT ADJ 0.5 ML IM SUSY: 0.5000 mL | PREFILLED_SYRINGE | INTRAMUSCULAR | Status: DC | PRN

## 2023-03-09 MED ORDER — ALBUTEROL SULFATE (2.5 MG/3ML) 0.083% IN NEBU
2.5000 mg | INHALATION_SOLUTION | RESPIRATORY_TRACT | Status: DC | PRN
  Administered 2023-03-10: 2.5 mg via RESPIRATORY_TRACT
  Filled 2023-03-09: qty 3

## 2023-03-09 MED ORDER — SODIUM CHLORIDE 0.9 % IV BOLUS
1000.0000 mL | Freq: Once | INTRAVENOUS | Status: AC
  Administered 2023-03-09: 1000 mL via INTRAVENOUS

## 2023-03-09 MED ORDER — PANTOPRAZOLE 2 MG/ML SUSPENSION: 40.0000 mg | Freq: Every day | ORAL | Status: DC

## 2023-03-09 MED ORDER — INSULIN ASPART 100 UNIT/ML IJ SOLN
0.0000 [IU] | Freq: Four times a day (QID) | INTRAMUSCULAR | Status: DC
  Administered 2023-03-09: 3 [IU] via SUBCUTANEOUS
  Administered 2023-03-10 (×3): 2 [IU] via SUBCUTANEOUS
  Administered 2023-03-10: 3 [IU] via SUBCUTANEOUS
  Administered 2023-03-11 (×2): 2 [IU] via SUBCUTANEOUS
  Administered 2023-03-11: 3 [IU] via SUBCUTANEOUS
  Administered 2023-03-12: 1 [IU] via SUBCUTANEOUS
  Administered 2023-03-12 – 2023-03-13 (×3): 2 [IU] via SUBCUTANEOUS
  Administered 2023-03-13 – 2023-03-14 (×3): 3 [IU] via SUBCUTANEOUS
  Administered 2023-03-14: 5 [IU] via SUBCUTANEOUS
  Administered 2023-03-14 – 2023-03-15 (×2): 3 [IU] via SUBCUTANEOUS
  Administered 2023-03-15 – 2023-03-16 (×5): 5 [IU] via SUBCUTANEOUS

## 2023-03-09 MED ORDER — INSULIN ASPART 100 UNIT/ML IJ SOLN: 0.0000 [IU] | INTRAMUSCULAR | Status: DC

## 2023-03-09 MED ORDER — ORAL CARE MOUTH RINSE: 15.0000 mL | OROMUCOSAL | Status: DC

## 2023-03-09 MED ORDER — LEVETIRACETAM 100 MG/ML PO SOLN
500.0000 mg | Freq: Two times a day (BID) | ORAL | Status: DC
  Administered 2023-03-09: 500 mg
  Filled 2023-03-09 (×4): qty 5

## 2023-03-09 MED ORDER — ORAL CARE MOUTH RINSE: 15.0000 mL | OROMUCOSAL | Status: DC | PRN

## 2023-03-09 MED ORDER — GLUCERNA 1.5 CAL PO LIQD
40.0000 mL/h | ORAL | Status: DC
  Administered 2023-03-09: 40 mL/h
  Filled 2023-03-09 (×2): qty 1000

## 2023-03-09 MED ORDER — VANCOMYCIN HCL IN DEXTROSE 1-5 GM/200ML-% IV SOLN: 1000.0000 mg | INTRAVENOUS | Status: DC

## 2023-03-09 MED ORDER — PANTOPRAZOLE SODIUM 40 MG IV SOLR
40.0000 mg | INTRAVENOUS | Status: DC
  Administered 2023-03-09 – 2023-03-15 (×7): 40 mg via INTRAVENOUS
  Filled 2023-03-09 (×9): qty 10

## 2023-03-09 MED ORDER — SODIUM CHLORIDE 0.9 % IV SOLN: INTRAVENOUS | Status: DC | PRN

## 2023-03-09 MED ORDER — AMLODIPINE BESYLATE 10 MG PO TABS
10.0000 mg | ORAL_TABLET | Freq: Every day | ORAL | Status: DC
  Administered 2023-03-09: 10 mg
  Filled 2023-03-09: qty 1

## 2023-03-09 MED ORDER — IOHEXOL 350 MG/ML SOLN
75.0000 mL | Freq: Once | INTRAVENOUS | Status: AC | PRN
  Administered 2023-03-09: 75 mL via INTRAVENOUS

## 2023-03-09 MED ORDER — SODIUM CHLORIDE 0.9 % IV SOLN
2.0000 g | Freq: Two times a day (BID) | INTRAVENOUS | Status: DC
  Administered 2023-03-09: 2 g via INTRAVENOUS
  Filled 2023-03-09: qty 12.5

## 2023-03-09 MED ORDER — FLUOXETINE HCL 20 MG PO CAPS
20.0000 mg | ORAL_CAPSULE | Freq: Every day | ORAL | Status: DC
  Administered 2023-03-09 – 2023-03-15 (×7): 20 mg
  Filled 2023-03-09 (×7): qty 1

## 2023-03-09 MED ORDER — OSMOLITE 1.2 CAL PO LIQD: 1000.0000 mL | ORAL | Status: DC

## 2023-03-09 NOTE — Plan of Care (Signed)
Patient remains in 4E-CV-PCU at time of writing. Patient will arouse to voice and state name; speech is on barely comprehensible. No purposeful movement from LUE noted; limited movement to RUE, LLE, RLE. Coarse and ronchorous breath sounds throughout all lung fields. Enteral feeds initiated per orders via PEG. PIV access only. External urinary diversion device. Patient requires total care for all ADLs. Patient remains on supplemental O2 via [place] at 5 L/min.  Edit: 03/10/23: 0253 hrs: Patient exhibiting increased WOB and SOB, increased RR, difficulty managing secretions, and audible wheezes. Dr. Joneen Roach notified. RT Nikki notified. Orders noted for STAT CXR. PRN neb is given, suctioning and repositioning performed. Enteral feeds held due to suspicion for aspiration (per MD) and PEG placed to gravity drainage bag.   Problem: Fluid Volume: Goal: Hemodynamic stability will improve Outcome: Progressing   Problem: Clinical Measurements: Goal: Diagnostic test results will improve Outcome: Progressing Goal: Signs and symptoms of infection will decrease Outcome: Progressing   Problem: Respiratory: Goal: Ability to maintain adequate ventilation will improve Outcome: Progressing   Problem: Education: Goal: Knowledge of the prescribed therapeutic regimen will improve Outcome: Progressing   Problem: Clinical Measurements: Goal: Usual level of consciousness will be regained or maintained. Outcome: Progressing Goal: Neurologic status will improve Outcome: Progressing Goal: Ability to maintain intracranial pressure will improve Outcome: Progressing   Problem: Skin Integrity: Goal: Demonstration of wound healing without infection will improve Outcome: Progressing   Problem: Activity: Goal: Ability to tolerate increased activity will improve Outcome: Progressing   Problem: Clinical Measurements: Goal: Ability to maintain a body temperature in the normal range will improve Outcome: Progressing    Problem: Respiratory: Goal: Ability to maintain adequate ventilation will improve Outcome: Progressing Goal: Ability to maintain a clear airway will improve Outcome: Progressing   Problem: Education: Goal: Ability to describe self-care measures that may prevent or decrease complications (Diabetes Survival Skills Education) will improve Outcome: Progressing Goal: Individualized Educational Video(s) Outcome: Progressing   Problem: Coping: Goal: Ability to adjust to condition or change in health will improve Outcome: Progressing   Problem: Fluid Volume: Goal: Ability to maintain a balanced intake and output will improve Outcome: Progressing   Problem: Health Behavior/Discharge Planning: Goal: Ability to identify and utilize available resources and services will improve Outcome: Progressing Goal: Ability to manage health-related needs will improve Outcome: Progressing   Problem: Metabolic: Goal: Ability to maintain appropriate glucose levels will improve Outcome: Progressing   Problem: Nutritional: Goal: Maintenance of adequate nutrition will improve Outcome: Progressing Goal: Progress toward achieving an optimal weight will improve Outcome: Progressing   Problem: Skin Integrity: Goal: Risk for impaired skin integrity will decrease Outcome: Progressing   Problem: Tissue Perfusion: Goal: Adequacy of tissue perfusion will improve Outcome: Progressing

## 2023-03-09 NOTE — H&P (Addendum)
History and Physical    Patient: Gina Kelley:[id number] DOB: 1935/12/05 DOA: 03/09/2023 DOS: the patient was seen and examined on 03/09/2023 PCP: Patient, No Pcp Per  Patient coming from: [name] via EMS  Chief Complaint:  Chief Complaint  Patient presents with   Aspiration   HPI: Gina Kelley is a 87 y.o. female with medical history significant of HTN, DM type II, large spontaneous subdural hematoma and intracerebral hemorrhage in 06/2022 requiring craniotomy, status post chronic indwelling Foley catheter and pain, and chronic  respiratory failure on 5 L of nasal cannula oxygen, history of GERD PUD, bed bound presents after having drop in her O2 saturations.  She is obtained from review of records and family present at bedside.  She just recently been hospitalized from 9/1-9/4 acute metabolic encephalopathy thought secondary to a Citrobacter UTI.  Prior to that patient had been hospitalized 8/16-8/21 with sepsis secondary to aspiration pneumonia.  Patient was evaluated by speech and recommended to remain n.p.o. PEG tube feeds.  Family had made note that her sleep issues have recently been increased from 45 mL/h up to 55 mL/h due to weight loss since hospitalization.  Family notes patient no longer needs assistance with eating.  She had recently had episodes of nausea and vomiting.  Staff noted O2 saturations dropped to 74% on 5 L and called EMS.  In the emergency department patient was noted to have temperature of 99.3 F with tachycardia and tachypnea, and all other vital signs relatively maintained.  Labs significant for WBC 19.6, hemoglobin 9.5, platelets 461, sodium 133, chloride 97, BUN 28, creatinine 0.94, glucose 268, lipase 92, AST 209, ALT 212, BNP 235.4, troponin 168, and lactic acid 5.4-> 3.8-> 4-> 3.6.  CT scan of the head acute stable sequela of right intraparenchymal hemorrhage.  CT scan of the chest abdomen pelvis noted patchy by lateral airspace opacities compatible with  multifocal pneumonia with bilateral patient had been bolused 2 L of normal saline IV fluids given vancomycin, and cefepime.  TRH called to admit.   Review of Systems: unable to review all systems due to the inability of the patient to answer questions. Past Medical History:  Diagnosis Date   Anemia of chronic disease 07/08/2006   Antral ulcer 02/23/2007   Seen on EGD in 2008, small ulcer with erosion    Cerebral vascular accident (HCC) 04/14/2006   1998, left lower extremity numbness, no residual deficits    Essential hypertension 04/14/2006   Gastroesophageal reflux disease 02/18/2013   Occasional, symptomatically relieved with peptobismol    Healthcare maintenance 12/04/2011   Hyperlipidemia LDL goal < 100 04/14/2006   Hypertensive retinopathy of both eyes, grade 1 05/15/2016   Osteopenia of right femoral neck 10/20/2016   DEXA (10/16/2016): R femur T -2.5 (FRAX tool calculates at -2.4), L1-L4 spine T -0.9, 10 year risk for: Major osteoporotic fracture 8.3%, Hip fracture 2.7%   Seizure disorder (HCC) 03/01/2023   Type 2 diabetes mellitus with stage 1 chronic kidney disease, without long-term current use of insulin (HCC)    Type II diabetes mellitus (HCC) 04/14/2006   Past Surgical History:  Procedure Laterality Date   COLONOSCOPY     CRANIOTOMY Right 07/08/2022   Procedure: RIGHT CRANIOTOMY HEMATOMA EVACUATION SUBDURAL;  Surgeon: Julio Sicks, MD;  Location: [facility] OR;  Service: Neurosurgery;  Laterality: Right;   ESOPHAGOGASTRODUODENOSCOPY     ESOPHAGOGASTRODUODENOSCOPY (EGD) WITH PROPOFOL N/A 08/01/2022   Procedure: ESOPHAGOGASTRODUODENOSCOPY (EGD) WITH PROPOFOL;  Surgeon: Diamantina Monks, MD;  Location: [facility]  ENDOSCOPY;  Service: General;  Laterality: N/A;   PEG PLACEMENT N/A 08/01/2022   Procedure: PERCUTANEOUS ENDOSCOPIC GASTROSTOMY (PEG) PLACEMENT;  Surgeon: Diamantina Monks, MD;  Location: [facility] ENDOSCOPY;  Service: General;  Laterality: N/A;   Social History:  reports that she quit smoking  about 44 years ago. Her smoking use included cigarettes. She has never used smokeless tobacco. She reports that she does not drink alcohol and does not use drugs.  Allergies  Allergen Reactions   Cozaar [Losartan] Swelling    Angioedema    Periogard [Chlorhexidine Gluconate] Swelling and Other (See Comments)    Skin irritation Eye/tongue/lip swelling   Pork Allergy Other (See Comments)    Unknown reaction   Vasotec [Enalapril] Cough    Family History  Problem Relation Age of Onset   Stroke Mother    Diabetes Mother    Hypertension Mother    Anuerysm Father 20       Cerebral   Asthma Sister    Breast cancer Sister    Pulmonary disease Brother        Black lung   Stroke Sister    Cirrhosis Brother    Hypertension Son    Asthma Brother     Prior to Admission medications   Medication Sig Start Date End Date Taking? Authorizing Provider  acetaminophen (TYLENOL) 500 MG tablet Place 500-1,000 mg into feeding tube See admin instructions. 1000 mg via G-tube twice daily and 500 mg four times daily as needed for pain    [provider]  amLODipine (NORVASC) 10 MG tablet Place 1 tablet (10 mg total) into feeding tube daily. 08/23/22   Setzer, Lynnell Jude, PA-C  ascorbic acid (VITAMIN C) 500 MG tablet Place 1 tablet (500 mg total) into feeding tube daily. 08/23/22   Setzer, Lynnell Jude, PA-C  atorvastatin (LIPITOR) 80 MG tablet Place 1 tablet (80 mg total) into feeding tube daily. 08/23/22   Setzer, Lynnell Jude, PA-C  carvedilol (COREG) 6.25 MG tablet Place 1 tablet (6.25 mg total) into feeding tube 2 (two) times daily with a meal. 08/22/22   Setzer, Lynnell Jude, PA-C  ezetimibe (ZETIA) 10 MG tablet Place 1 tablet (10 mg total) into feeding tube daily. 08/23/22   Setzer, Lynnell Jude, PA-C  famotidine (PEPCID) 20 MG tablet Place 1 tablet (20 mg total) into feeding tube 2 (two) times daily. 08/22/22   Setzer, Lynnell Jude, PA-C  ferrous sulfate 300 (60 Fe) MG/5ML syrup Place 6.8 mLs (408 mg total) into  feeding tube daily. 03/04/23   Pokhrel, Rebekah Chesterfield, MD  FLUoxetine (PROZAC) 20 MG capsule Place 1 capsule (20 mg total) into feeding tube at bedtime. 08/22/22   Setzer, Lynnell Jude, PA-C  gabapentin (NEURONTIN) 250 MG/5ML solution Place 2 mLs (100 mg total) into feeding tube every 8 (eight) hours. 08/22/22   Setzer, Lynnell Jude, PA-C  guaiFENesin (ROBITUSSIN) 100 MG/5ML liquid Place 200 mg into feeding tube 4 (four) times daily.    [provider]  insulin lispro (HUMALOG) 100 UNIT/ML KwikPen Inject 0-15 Units into the skin See admin instructions. Inject 0-15 units 4 times daily per sliding scale: < 69 : implement hypoglycemic protocol 70-150 : 0 units 151-200 : 3 units 201-250 : 6 units 251-300 : 9 units 301-350 : 12 units 351-400 : 16 units > 401 : call MD    [provider]  ipratropium-albuterol (DUONEB) 0.5-2.5 (3) MG/3ML SOLN Take 3 mLs by nebulization every 4 (four) hours as needed. 02/18/23   Ghimire, Werner Lean, MD  l-methylfolate-B6-B12 (METANX) 3-35-2 MG TABS tablet Place 1 tablet into feeding tube daily. 08/23/22   Setzer, Lynnell Jude, PA-C  levETIRAcetam (KEPPRA) 100 MG/ML solution Place 5 mLs (500 mg total) into feeding tube 2 (two) times daily. 01/22/23   Micki Riley, MD  lidocaine 4 % Place 1 patch onto the skin daily.    [provider]  metFORMIN (GLUCOPHAGE) 500 MG tablet Place 1 tablet (500 mg total) into feeding tube 2 (two) times daily with a meal. Patient taking differently: Place 750 mg into feeding tube 2 (two) times daily. 08/22/22   Setzer, Lynnell Jude, PA-C  Multiple Vitamins-Minerals (PRESERVISION AREDS 2) CHEW Give 1 each by tube 2 (two) times daily.    [provider]  Nutritional Supplements (FEEDING SUPPLEMENT, GLUCERNA 1.5 CAL,) LIQD Place 1,000 mLs into feeding tube daily. Patient taking differently: Place 40 mL/hr into feeding tube daily. 02/18/23   Ghimire, Werner Lean, MD  ondansetron (ZOFRAN) 4 MG tablet Take 4 mg by mouth every 8 (eight) hours  as needed for nausea or vomiting.    [provider]  pantoprazole (PROTONIX) 40 MG tablet Take 1 tablet (40 mg total) by mouth daily. 03/04/23 03/03/24  Pokhrel, Rebekah Chesterfield, MD  Water For Irrigation, Sterile (FREE WATER) SOLN Place 120 mLs into feeding tube every 4 (four) hours. 03/04/23   Joycelyn Das, MD    Physical Exam: Vitals:   03/09/23 0915 03/09/23 1000 03/09/23 1030 03/09/23 1100  BP: 129/81 (!) 172/101 (!) 172/81   Pulse: 94 (!) 113 (!) 107   Resp: 19 (!) 21 (!) 21   Temp:    97.7 F (36.5 C)  TempSrc:      SpO2: 100% 98% 99%    Constitutional: Elderly female who is lethargic and not really responding to verbal commands at this time. Eyes: PERRL, lids and conjunctivae normal ENMT: Mucous membranes are moist.  Dry mucous membranes. Neck: normal, supple,  Respiratory: decreased breath sounds, no wheezing, no crackles. Normal respiratory effort.  Cardiovascular: mildly tachycardic, no murmurs / rubs / gallops. No extremity edema, 1+ pedal pulses bilaterally. Abdomen: no tenderness, no masses palpated.  Bowel sounds positive.  Musculoskeletal: no clubbing / cyanosis. No joint deformity upper and lower extremities. Good ROM, no contractures. Normal muscle tone.  Skin: no rashes, lesions, ulcers. No induration Neurologic: Left-sided hemiparesis. Psychiatric: Lethargic and unable to assess at this time.  Data Reviewed:  Sinus tachycardia 109 bpm with QTc 492.  Reviewed labs, imaging, and pertinent records as documented.  Assessment and Plan:  Sepsis secondary to aspiration pneumonia Chronic respiratory failure with hypoxia Patient presented after being noted to have drop in O2 saturations on normal 5 L of nasal cannula oxygen with O2 saturations reported to be as low as 78%.  Patient was found to be tachycardic and tachypneic with initial white blood cell count elevated to 19.6 meeting SIRS criteria. CT angiogram of the chest abdomen pelvis noted multifocal pneumonia.   Influenza, COVID-19, and RSV screening were negative.  O2 saturations currently maintained on oxygen.  Just recently hospitalized for aspiration lactic acid was initially noted to be elevated at 5.4.  Blood cultures have been obtained.  Patient was started on Parikh antibiotics of vancomycin and cefepime. -Admit to a progressive bed -Continuous pulse oximetry with oxygen maintain O2 saturation greater than 92% -Aspiration precautions with elevation of the head of the bed -Incentive spirometry, flutter valve, pulmonary toilet with respiratory therapist consulted -Follow-up blood cultures -Check urine Legionella, urine strep, and MRSA screen -  Check urinalysis and culture as patient previously had a Citrobacter UTI -Check procalcitonin -Continue empiric antibiotics of vancomycin and cefepime.  De-escalate when medically appropriate -Lactic acid levels  Elevated troponin High-sensitivity troponins 168-> 160.  EKG without significant ischemic changes.  Suspect secondary to demand in the setting of sepsis. -Continue to monitor  History of ICH/SDH s/p craniotomy Dysphagia   Patient with history of ICH requiring craniotomy in January of this year.  Residual left sided hemiparesis/dysphagia requiring PEG tube.  Tube feeds had recently been increased to 55 mL/h prior to episodes of nausea and vomiting. -N.p.o. -Holding tube feeds temporarily until nausea and vomiting resolves and resume Glucerna at 40 mL/h  Acute metabolic encephalopathy Patient noted to be lethargic at this time.  CT scan of the head noted no acute intercranial abnormality and unchanged sequela from the intraparenchymal hemorrhage in the right frontal lobe. Possibly secondary to acute infection. -Neurochecks  Normocytic anemia Hemoglobin 9.5 g/dL which is improved from prior 7.5 on 9/30.  During last hospitalization patient was noted to have positive stool guaiacs.  Patient was thought not to be a candidate for  endoscopy. -Continue ferrous sulfate -Recheck CBC tomorrow morning  Essential hypertension Blood pressures initially elevated up to 172/101. -Continue Norvasc and Coreg  Diabetes mellitus type 2, with long-term use of insulin On admission glucose elevated up to 268.  Patient is on metformin and sliding scale of insulin at the nursing facility. -Hypoglycemic protocols -Hold metformin -CBGs every 6 hours with sensitive SSI -Adjust insulin regimen as needed  Transaminitis Acute on chronic.  AST 209 and ALT 212 acutely elevated from prior with lipase elevated as well.  CT did not note any acute intra-abdominal process. -Recheck CMP in a.m.  History of bladder outlet obstruction -Monitor postvoid residuals   Seizure disorder -Continue Keppra  Hyperlipidemia  -Held atorvastatin due to acute liver dysfunction  Depression -Continue Prozac  GERD -Continue Protonix  DVT prophylaxis: SCDs  Advance Care Planning:   Code Status: Full Code    Consults: None  Family Communication: Family at over the phone and at bedside  Severity of Illness: The appropriate patient status for this patient is INPATIENT. Inpatient status is judged to be reasonable and necessary in order to provide the required intensity of service to ensure the patient's safety. The patient's presenting symptoms, physical exam findings, and initial radiographic and laboratory data in the context of their chronic comorbidities is felt to place them at high risk for further clinical deterioration. Furthermore, it is not anticipated that the patient will be medically stable for discharge from the hospital within 2 midnights of admission.   * I certify that at the point of admission it is my clinical judgment that the patient will require inpatient hospital care spanning beyond 2 midnights from the point of admission due to high intensity of service, high risk for further deterioration and high frequency of surveillance  required.*  Author: Clydie Braun, MD 03/09/2023 11:15 AM  For on call review [email or web address].

## 2023-03-09 NOTE — Progress Notes (Signed)
ED Pharmacy Antibiotic Sign Off An antibiotic consult was received from an ED provider for vancomycin and cefepime per pharmacy dosing for sepsis. A chart review was completed to assess appropriateness.   The following one time order(s) were placed:  Vancomycin 1000mg  IV x1 Cefepime 2G IV x1  Further antibiotic and/or antibiotic pharmacy consults should be ordered by the admitting provider if indicated.   Thank you for allowing pharmacy to be a part of this patient's care.   Smitty Cords, [facility]  Clinical Pharmacist 03/09/23 8:27 AM

## 2023-03-09 NOTE — ED Notes (Signed)
ED TO INPATIENT HANDOFF REPORT  ED Nurse Name and Phone #: [phone] Tori   S Name/Age/Gender Gina Kelley 87 y.o. female Room/Bed: 016C/016C  Code Status   Code Status: Full Code  Home/[name]/Other Home adams  Patient oriented to: self Is this baseline? Yes   Triage Complete: Triage complete  Chief Complaint Sepsis [facility]) [A41.9]  Triage Note Pt BIB EMS for possible aspiration. Pt aphasic at baseline. Pt had diarrhea and vomiting and O2 dropped to 74% on 5L. Pt is normally on  5L at baseline.    Allergies Allergies  Allergen Reactions   Cozaar [Losartan] Swelling    Angioedema    Periogard [Chlorhexidine Gluconate] Swelling and Other (See Comments)    Skin irritation Eye/tongue/lip swelling   Pork Allergy Other (See Comments)    Unknown reaction   Vasotec [Enalapril] Cough    Level of Care/Admitting Diagnosis ED Disposition     ED Disposition  Admit   Condition  --   Comment  Hospital [facility] [[id number]]  Level of Care: Progressive [102]  Admit to Progressive based on following criteria: GI, ENDOCRINE disease patients with GI bleeding, acute liver failure or pancreatitis, stable with diabetic ketoacidosis or thyrotoxicosis (hypothyroid) state.  May admit patient to [facility] or [facility] if equivalent level of care is available:: No  Covid Evaluation: Asymptomatic - no recent exposure (last 10 days) testing not required  Diagnosis: Sepsis [facility]) [[id number]]  Admitting Physician: Clydie Braun [[id number]]  Attending Physician: Clydie Braun [[id number]]  Certification:: I certify this patient will need inpatient services for at least 2 midnights  Expected Medical Readiness: 03/11/2023          B Medical/Surgery History Past Medical History:  Diagnosis Date   Anemia of chronic disease 07/08/2006   Antral ulcer 02/23/2007   Seen on EGD in 2008, small ulcer with erosion    Cerebral vascular accident (HCC) 04/14/2006   1998, left  lower extremity numbness, no residual deficits    Essential hypertension 04/14/2006   Gastroesophageal reflux disease 02/18/2013   Occasional, symptomatically relieved with peptobismol    Healthcare maintenance 12/04/2011   Hyperlipidemia LDL goal < 100 04/14/2006   Hypertensive retinopathy of both eyes, grade 1 05/15/2016   Osteopenia of right femoral neck 10/20/2016   DEXA (10/16/2016): R femur T -2.5 (FRAX tool calculates at -2.4), L1-L4 spine T -0.9, 10 year risk for: Major osteoporotic fracture 8.3%, Hip fracture 2.7%   Seizure disorder (HCC) 03/01/2023   Type 2 diabetes mellitus with stage 1 chronic kidney disease, without long-term current use of insulin (HCC)    Type II diabetes mellitus (HCC) 04/14/2006   Past Surgical History:  Procedure Laterality Date   COLONOSCOPY     CRANIOTOMY Right 07/08/2022   Procedure: RIGHT CRANIOTOMY HEMATOMA EVACUATION SUBDURAL;  Surgeon: Julio Sicks, MD;  Location: [facility] OR;  Service: Neurosurgery;  Laterality: Right;   ESOPHAGOGASTRODUODENOSCOPY     ESOPHAGOGASTRODUODENOSCOPY (EGD) WITH PROPOFOL N/A 08/01/2022   Procedure: ESOPHAGOGASTRODUODENOSCOPY (EGD) WITH PROPOFOL;  Surgeon: Diamantina Monks, MD;  Location: [facility] ENDOSCOPY;  Service: General;  Laterality: N/A;   PEG PLACEMENT N/A 08/01/2022   Procedure: PERCUTANEOUS ENDOSCOPIC GASTROSTOMY (PEG) PLACEMENT;  Surgeon: Diamantina Monks, MD;  Location: [facility] ENDOSCOPY;  Service: General;  Laterality: N/A;     A IV Location/Drains/Wounds Patient Lines/Drains/Airways Status     Active Line/Drains/Airways     Name Placement date Placement time Site Days   Peripheral IV 03/09/23 20 G Anterior;Proximal;Right Forearm  03/09/23  0745  Forearm  less than 1   Gastrostomy/Enterostomy PEG-jejunostomy 24 Fr. LLQ 08/01/22  1044  LLQ  220            Intake/Output Last 24 hours No intake or output data in the 24 hours ending 03/09/23 1334  Labs/Imaging Results for orders placed or performed during the hospital  encounter of 03/09/23 (from the past 48 hour(s))  Resp panel by RT-PCR (RSV, Flu A&B, Covid) Anterior Nasal Swab     Status: None   Collection Time: 03/09/23  7:23 AM   Specimen: Anterior Nasal Swab  Result Value Ref Range   SARS Coronavirus 2 by RT PCR NEGATIVE NEGATIVE   Influenza A by PCR NEGATIVE NEGATIVE   Influenza B by PCR NEGATIVE NEGATIVE    Comment: (NOTE) The Xpert Xpress SARS-CoV-2/FLU/RSV plus assay is intended as an aid in the diagnosis of influenza from Nasopharyngeal swab specimens and should not be used as a sole basis for treatment. Nasal washings and aspirates are unacceptable for Xpert Xpress SARS-CoV-2/FLU/RSV testing.  Fact Sheet for Patients: BloggerCourse.com  Fact Sheet for Healthcare Providers: SeriousBroker.it  This test is not yet approved or cleared by the [place] FDA and has been authorized for detection and/or diagnosis of SARS-CoV-2 by FDA under an Emergency Use Authorization (EUA). This EUA will remain in effect (meaning this test can be used) for the duration of the COVID-19 declaration under Section 564(b)(1) of the Act, 21 U.S.C. section 360bbb-3(b)(1), unless the authorization is terminated or revoked.     Resp Syncytial Virus by PCR NEGATIVE NEGATIVE    Comment: (NOTE) Fact Sheet for Patients: BloggerCourse.com  Fact Sheet for Healthcare Providers: SeriousBroker.it  This test is not yet approved or cleared by the [place] FDA and has been authorized for detection and/or diagnosis of SARS-CoV-2 by FDA under an Emergency Use Authorization (EUA). This EUA will remain in effect (meaning this test can be used) for the duration of the COVID-19 declaration under Section 564(b)(1) of the Act, 21 U.S.C. section 360bbb-3(b)(1), unless the authorization is terminated or revoked.  Performed at [facility], [address]  [place], [place] [address]   CBC with Differential     Status: Abnormal   Collection Time: 03/09/23  7:48 AM  Result Value Ref Range   WBC 19.6 (H) 4.0 - 10.5 K/uL   RBC 3.12 (L) 3.87 - 5.11 MIL/uL   Hemoglobin 9.5 (L) 12.0 - 15.0 g/dL   HCT 32.4 (L) 40.1 - 02.7 %   MCV 98.4 80.0 - 100.0 fL   MCH 30.4 26.0 - 34.0 pg   MCHC 30.9 30.0 - 36.0 g/dL   RDW 25.3 (H) 66.4 - 40.3 %   Platelets 461 (H) 150 - 400 K/uL   nRBC 0.0 0.0 - 0.2 %   Neutrophils Relative % 93 %   Neutro Abs 18.1 (H) 1.7 - 7.7 K/uL   Lymphocytes Relative 2 %   Lymphs Abs 0.4 (L) 0.7 - 4.0 K/uL   Monocytes Relative 5 %   Monocytes Absolute 1.0 0.1 - 1.0 K/uL   Eosinophils Relative 0 %   Eosinophils Absolute 0.0 0.0 - 0.5 K/uL   Basophils Relative 0 %   Basophils Absolute 0.0 0.0 - 0.1 K/uL   Immature Granulocytes 0 %   Abs Immature Granulocytes 0.08 (H) 0.00 - 0.07 K/uL    Comment: Performed at [facility], [address], [place], [place] [address]  Comprehensive metabolic panel  Status: Abnormal   Collection Time: 03/09/23  7:48 AM  Result Value Ref Range   Sodium 133 (L) 135 - 145 mmol/L   Potassium 4.0 3.5 - 5.1 mmol/L   Chloride 97 (L) 98 - 111 mmol/L   CO2 25 22 - 32 mmol/L   Glucose, Bld 268 (H) 70 - 99 mg/dL    Comment: Glucose reference range applies only to samples taken after fasting for at least 8 hours.   BUN 28 (H) 8 - 23 mg/dL   Creatinine, Ser 5.40 0.44 - 1.00 mg/dL   Calcium 8.9 8.9 - 98.1 mg/dL   Total Protein 7.6 6.5 - 8.1 g/dL   Albumin 2.7 (L) 3.5 - 5.0 g/dL   AST 191 (H) 15 - 41 U/L   ALT 212 (H) 0 - 44 U/L   Alkaline Phosphatase 123 38 - 126 U/L   Total Bilirubin 0.4 0.3 - 1.2 mg/dL   GFR, Estimated 59 (L) >60 mL/min    Comment: (NOTE) Calculated using the CKD-EPI Creatinine Equation (2021)    Anion gap 11 5 - 15    Comment: Performed at [facility], [address] [place], [place] [address]  Lipase, blood     Status: Abnormal   Collection Time: 03/09/23  7:48 AM   Result Value Ref Range   Lipase 92 (H) 11 - 51 U/L    Comment: Performed at [facility], [address] [place], [place] [address]  Troponin I (High Sensitivity)     Status: Abnormal   Collection Time: 03/09/23  7:48 AM  Result Value Ref Range   Troponin I (High Sensitivity) 168 (HH) <18 ng/L    Comment: CRITICAL RESULT CALLED TO, READ BACK BY AND VERIFIED WITH TORI VALDEZ,RN AT 0900 03/09/2023 BY ZBEECH. (NOTE) Elevated high sensitivity troponin I (hsTnI) values and significant  changes across serial measurements may suggest ACS but many other  chronic and acute conditions are known to elevate hsTnI results.  Refer to the "Links" section for chest pain algorithms and additional  guidance. Performed at [facility], [address] [place], [place] [address]   Brain natriuretic peptide     Status: Abnormal   Collection Time: 03/09/23  7:48 AM  Result Value Ref Range   B Natriuretic Peptide 235.4 (H) 0.0 - 100.0 pg/mL    Comment: Performed at [facility], [address] [place], [place] [address]  I-Stat CG4 Lactic Acid     Status: Abnormal   Collection Time: 03/09/23  8:49 AM  Result Value Ref Range   Lactic Acid, Venous 5.4 (HH) 0.5 - 1.9 mmol/L   Comment NOTIFIED PHYSICIAN   Troponin I (High Sensitivity)     Status: Abnormal   Collection Time: 03/09/23  9:23 AM  Result Value Ref Range   Troponin I (High Sensitivity) 160 (HH) <18 ng/L    Comment: CRITICAL VALUE NOTED. VALUE IS CONSISTENT WITH PREVIOUSLY REPORTED/CALLED VALUE (NOTE) Elevated high sensitivity troponin I (hsTnI) values and significant  changes across serial measurements may suggest ACS but many other  chronic and acute conditions are known to elevate hsTnI results.  Refer to the "Links" section for chest pain algorithms and additional  guidance. Performed at [facility], [address] [place], [place] [address]   I-Stat CG4 Lactic Acid     Status: Abnormal   Collection Time:  03/09/23 10:04 AM  Result Value Ref Range   Lactic Acid, Venous 3.8 (HH) 0.5 -  1.9 mmol/L   Comment NOTIFIED PHYSICIAN   I-Stat CG4 Lactic Acid, ED     Status: Abnormal   Collection Time: 03/09/23 10:08 AM  Result Value Ref Range   Lactic Acid, Venous 4.0 (HH) 0.5 - 1.9 mmol/L   Comment NOTIFIED PHYSICIAN   I-Stat venous blood gas, ([facility] ED, [facility], [facility])     Status: Abnormal   Collection Time: 03/09/23 10:12 AM  Result Value Ref Range   pH, Ven 7.397 7.25 - 7.43   pCO2, Ven 40.2 (L) 44 - 60 mmHg   pO2, Ven 137 (H) 32 - 45 mmHg   Bicarbonate 24.7 20.0 - 28.0 mmol/L   TCO2 26 22 - 32 mmol/L   O2 Saturation 99 %   Acid-Base Excess 0.0 0.0 - 2.0 mmol/L   Sodium 138 135 - 145 mmol/L   Potassium 4.0 3.5 - 5.1 mmol/L   Calcium, Ion 1.11 (L) 1.15 - 1.40 mmol/L   HCT 30.0 (L) 36.0 - 46.0 %   Hemoglobin 10.2 (L) 12.0 - 15.0 g/dL   Sample type VENOUS   I-Stat CG4 Lactic Acid, ED     Status: Abnormal   Collection Time: 03/09/23 11:03 AM  Result Value Ref Range   Lactic Acid, Venous 3.6 (HH) 0.5 - 1.9 mmol/L   Comment NOTIFIED PHYSICIAN    CT CHEST ABDOMEN PELVIS W CONTRAST  Result Date: 03/09/2023 CLINICAL DATA:  Sepsis.  Mental status change. EXAM: CT CHEST, ABDOMEN, AND PELVIS WITH CONTRAST TECHNIQUE: Multidetector CT imaging of the chest, abdomen and pelvis was performed following the standard protocol during bolus administration of intravenous contrast. RADIATION DOSE REDUCTION: This exam was performed according to the departmental dose-optimization program which includes automated exposure control, adjustment of the mA and/or kV according to patient size and/or use of iterative reconstruction technique. CONTRAST:  75mL OMNIPAQUE IOHEXOL 350 MG/ML SOLN COMPARISON:  View chest x-ray 03/09/2023 FINDINGS: CT CHEST FINDINGS Cardiovascular: The heart size is normal. Coronary artery calcifications are present. Atherosclerotic changes are present at the aortic arch great vessel origins without aneurysm  or focal stenosis. Pulmonary artery size is normal. No filling defects are present. Mediastinum/Nodes: Multiple right paratracheal lymph nodes measure up to 8 mm. Mild bilateral hilar adenopathy is present. The thoracic inlet is within normal limits. The upper soft a Gus is gas-filled. No obstructing lesion is present. Lungs/Pleura: Patchy bilateral airspace opacities are confirmed. Consolidated airspace disease is more prominent in the lower lobes, right greater than left. The airways are patent. No focal mass lesion is present. No significant pleural disease is present. Musculoskeletal: Vertebral body heights and alignment are normal. Mild anterior degenerative changes are present in the upper thoracic spine. The thoracic kyphosis is normal. The ribs are unremarkable. CT ABDOMEN PELVIS FINDINGS Hepatobiliary: Mild intra and extrahepatic biliary dilation is within normal limits following cholecystectomy. No focal hepatic lesions are present. Pancreas: Unremarkable. No pancreatic ductal dilatation or surrounding inflammatory changes. Spleen: Normal in size without focal abnormality. Adrenals/Urinary Tract: The kidneys are unremarkable. No stone or mass lesion is present. Obstruction is present. The ureters are normal bilaterally. The urinary bladder is within normal limits. Stomach/Bowel: Stomach and duodenum scratched at a gastrostomy tube is in place. The stomach and duodenum are otherwise within normal limits. The small bowel is unremarkable. The terminal ileum is normal. The ascending and transverse colon are normal. The descending and sigmoid colon are within normal limits. Vascular/Lymphatic: Atherosclerotic calcifications are present in the aorta and branch vessels. No aneurysm is present. Reproductive: 5 cm  calcified fibroid is present posteriorly on the left. The uterus and adnexa are otherwise unremarkable. Other: A 15 mm paraumbilical hernia contains fat without bowel. No other significant ventral hernia  is present. No free fluid or free air is present. Musculoskeletal: Degenerative changes are present in the lower lumbar spine with slight anterolisthesis at L4-5. No focal osseous lesions are present. IMPRESSION: 1. Patchy bilateral airspace opacities compatible with multifocal pneumonia. 2. Mild bilateral hilar and mediastinal adenopathy is likely reactive. 3. Coronary artery disease. 4. 5 cm calcified fibroid posteriorly on the left. 5. 15 mm paraumbilical hernia contains fat without bowel. 6. Degenerative changes in the lower lumbar spine with slight anterolisthesis at L4-5. 7.  Aortic Atherosclerosis (ICD10-I70.0). Electronically Signed   By: Marin Roberts M.D.   On: 03/09/2023 11:36   CT Head Wo Contrast  Result Date: 03/09/2023 CLINICAL DATA:  Mental status change, unknown cause. EXAM: CT HEAD WITHOUT CONTRAST TECHNIQUE: Contiguous axial images were obtained from the base of the skull through the vertex without intravenous contrast. RADIATION DOSE REDUCTION: This exam was performed according to the departmental dose-optimization program which includes automated exposure control, adjustment of the mA and/or kV according to patient size and/or use of iterative reconstruction technique. COMPARISON:  Head CT 03/01/2023.  MRI brain 03/02/2023. FINDINGS: Brain: No acute intracranial hemorrhage. Unchanged sequela of prior intraparenchymal hemorrhage in the right frontal lobe. Stable background of moderate chronic small-vessel disease with old lacunar infarcts in the bilateral basal ganglia. No acute hydrocephalus or extra-axial collection. No mass effect or midline shift. Vascular: No hyperdense vessel or unexpected calcification. Skull: Prior right frontotemporal craniotomy. No acute calvarial fracture. Skull base is unremarkable. Sinuses/Orbits: Trace fluid in the sphenoid sinuses. Orbits are unremarkable. Other: None. IMPRESSION: 1. No acute intracranial abnormality. 2. Unchanged sequela of prior  intraparenchymal hemorrhage in the right frontal lobe. 3. Stable background of moderate chronic small-vessel disease with old lacunar infarcts in the bilateral basal ganglia. Electronically Signed   By: Orvan Falconer M.D.   On: 03/09/2023 11:04   DG Chest Portable 1 View  Result Date: 03/09/2023 CLINICAL DATA:  Cough.  Possible aspiration.  Vomiting. EXAM: PORTABLE CHEST 1 VIEW COMPARISON:  03/01/2023 FINDINGS: Interval development of patchy and nodular airspace disease in both upper lungs. More confluent airspace consolidative opacity is seen in the right hilar region. Stable asymmetric elevation right hemidiaphragm. Cardiopericardial silhouette is at upper limits of normal for size. Telemetry leads overlie the chest. IMPRESSION: Interval development of patchy and nodular airspace disease in both upper lungs with more confluent airspace disease in the right hilar region. Imaging features are compatible with multifocal pneumonia. Despite the prominent nodularity of these acute findings, rapid interval development would make metastatic disease unlikely. Electronically Signed   By: Kennith Center M.D.   On: 03/09/2023 08:05    Pending Labs Unresulted Labs (From admission, onward)     Start     Ordered   03/09/23 1326  Urinalysis, w/ Reflex to Culture (Infection Suspected) -Urine, Clean Catch  (Urine Labs)  Once,   R       Question:  Specimen Source  Answer:  Urine, Clean Catch   03/09/23 1325   03/09/23 1319  MRSA Next Gen by PCR, Nasal  Once,   R        03/09/23 1320   03/09/23 1319  Expectorated Sputum Assessment w Gram Stain, Rflx to Resp Cult  (COPD / Pneumonia / Cellulitis / Lower Extremity Wound)  Once,   R  03/09/23 1320   03/09/23 1319  Legionella Pneumophila Serogp 1 Ur Ag  (COPD / Pneumonia / Cellulitis / Lower Extremity Wound)  Once,   R        03/09/23 1320   03/09/23 1319  Strep pneumoniae urinary antigen  (COPD / Pneumonia / Cellulitis / Lower Extremity Wound)  Once,   R         03/09/23 1320   03/09/23 0821  Blood culture (routine x 2)  BLOOD CULTURE X 2,   R (with STAT occurrences)      03/09/23 0820            Vitals/Pain Today's Vitals   03/09/23 0915 03/09/23 1000 03/09/23 1030 03/09/23 1100  BP: 129/81 (!) 172/101 (!) 172/81   Pulse: 94 (!) 113 (!) 107   Resp: 19 (!) 21 (!) 21   Temp:    97.7 F (36.5 C)  TempSrc:      SpO2: 100% 98% 99%     Isolation Precautions No active isolations  Medications Medications  sodium chloride flush (NS) 0.9 % injection 3 mL (3 mLs Intravenous Not Given 03/09/23 1321)  albuterol (PROVENTIL) (2.5 MG/3ML) 0.083% nebulizer solution 2.5 mg (has no administration in time range)  vancomycin (VANCOCIN) IVPB 1000 mg/200 mL premix (0 mg Intravenous Stopped 03/09/23 1144)  ceFEPIme (MAXIPIME) 2 g in sodium chloride 0.9 % 100 mL IVPB (0 g Intravenous Stopped 03/09/23 0929)  sodium chloride 0.9 % bolus 1,000 mL (0 mLs Intravenous Stopped 03/09/23 1321)  sodium chloride 0.9 % bolus 1,000 mL (0 mLs Intravenous Stopped 03/09/23 1334)  iohexol (OMNIPAQUE) 350 MG/ML injection 75 mL (75 mLs Intravenous Contrast Given 03/09/23 1015)    Mobility non-ambulatory     Focused Assessments Neuro Assessment Handoff:  Swallow screen pass? No          Neuro Assessment:   Neuro Checks:      Has TPA been given? No If patient is a Neuro Trauma and patient is going to OR before floor call report to 4N Charge nurse: [phone] or [phone]  , Pulmonary Assessment Handoff:  Lung sounds: L Breath Sounds: Rhonchi R Breath Sounds: Rhonchi        R Recommendations: See Admitting Provider Note  Report given to:   Additional Notes: Pt is non-verbal at baseline

## 2023-03-09 NOTE — ED Notes (Signed)
Transported to CT 

## 2023-03-09 NOTE — Sepsis Progress Note (Signed)
Notified provider and bedside nurse of need to order and administer fluid bolus, pt needs 1593 cc fluid.

## 2023-03-09 NOTE — Progress Notes (Signed)
Pharmacy Antibiotic Note  Gina Kelley is a 87 y.o. female admitted on 03/09/2023 with concern for pna.  Pharmacy has been consulted for vancomycin and cefepime dosing.  Vancomycin loading dose given in ED  Plan: Vancomycin 1g IV q 36h (eAUC 501) Add MRSA PCR Cefepime 2g IV q 12h Monitor renal function, Cx/PCR and clinical progression to narrow Vancomycin levels as indicated     Temp (24hrs), Avg:98.3 F (36.8 C), Min:97.7 F (36.5 C), Max:99.3 F (37.4 C)  Recent Labs  Lab 03/03/23 0739 03/09/23 0748 03/09/23 0849 03/09/23 1004 03/09/23 1008 03/09/23 1103  WBC 9.7 19.6*  --   --   --   --   CREATININE 0.52 0.94  --   --   --   --   LATICACIDVEN  --   --  5.4* 3.8* 4.0* 3.6*    Estimated Creatinine Clearance: 36.2 mL/min (by C-G formula based on SCr of 0.94 mg/dL).    Allergies  Allergen Reactions   Cozaar [Losartan] Swelling    Angioedema    Periogard [Chlorhexidine Gluconate] Swelling and Other (See Comments)    Skin irritation Eye/tongue/lip swelling   Pork Allergy Other (See Comments)    Unknown reaction   Vasotec [Enalapril] Cough    Daylene Posey, PharmD, [facility] Clinical Pharmacist ED Pharmacist Phone # [phone] 03/09/2023 1:43 PM

## 2023-03-09 NOTE — Progress Notes (Signed)
IVT consult placed for additional PIV access. Patient has 1 PIV in place for IV abx and fluid bolus. Secure chat sent to RN that additional access isn't indicated and concern for abx reaction (and inability to determine which is the culprit) is greater if both antibiotics are given at same time. If additional access is needed, new IVT consult can be placed.   Ayssa Bentivegna Loyola Mast, RN

## 2023-03-09 NOTE — ED Provider Notes (Addendum)
[place] EMERGENCY DEPARTMENT AT [facility] Provider Note   CSN: [id number] Arrival date & time: 03/09/23  2956     History  Chief Complaint  Patient presents with   Aspiration    Gina Kelley is a 87 y.o. female.  Overall level 5 caveat due to aphasia at baseline from stroke.  Sounds like maybe some nausea and vomiting symptoms overnight.  Family states that he has recently increased her tube feeds.  History of stroke status post PEG tube.  Patient on 5 L of oxygen at baseline.  Normally can move her right arm which sounds like she is doing today.  But nausea and vomiting and recent admission for possible infectious process/UTI.  Overall history is limited otherwise.  The history is provided by a caregiver.       Home Medications Prior to Admission medications   Medication Sig Start Date End Date Taking? Authorizing Provider  acetaminophen (TYLENOL) 500 MG tablet Place 500 mg into feeding tube 4 (four) times daily as needed for mild pain or moderate pain.   Yes [provider]  acetaminophen (TYLENOL) 500 MG tablet Place 1,000 mg into feeding tube in the morning and at bedtime.   Yes [provider]  amLODipine (NORVASC) 10 MG tablet Place 1 tablet (10 mg total) into feeding tube daily. 08/23/22  Yes Setzer, Lynnell Jude, PA-C  ascorbic acid (VITAMIN C) 500 MG tablet Place 1 tablet (500 mg total) into feeding tube daily. 08/23/22  Yes Setzer, Lynnell Jude, PA-C  atorvastatin (LIPITOR) 80 MG tablet Place 1 tablet (80 mg total) into feeding tube daily. 08/23/22  Yes Setzer, Lynnell Jude, PA-C  carvedilol (COREG) 6.25 MG tablet Place 1 tablet (6.25 mg total) into feeding tube 2 (two) times daily with a meal. 08/22/22  Yes Setzer, Lynnell Jude, PA-C  ezetimibe (ZETIA) 10 MG tablet Place 1 tablet (10 mg total) into feeding tube daily. 08/23/22  Yes Setzer, Lynnell Jude, PA-C  famotidine (PEPCID) 20 MG tablet Place 1 tablet (20 mg total) into feeding tube 2 (two) times daily.  08/22/22  Yes Setzer, Lynnell Jude, PA-C  ferrous sulfate 220 (44 Fe) MG/5ML solution Place 408 mg into feeding tube daily.   Yes [provider]  FLUoxetine (PROZAC) 20 MG capsule Place 1 capsule (20 mg total) into feeding tube at bedtime. 08/22/22  Yes Setzer, Lynnell Jude, PA-C  gabapentin (NEURONTIN) 250 MG/5ML solution Place 2 mLs (100 mg total) into feeding tube every 8 (eight) hours. 08/22/22  Yes Setzer, Lynnell Jude, PA-C  guaiFENesin (ROBITUSSIN) 100 MG/5ML liquid Place 200 mg into feeding tube 4 (four) times daily.   Yes [provider]  insulin lispro (HUMALOG) 100 UNIT/ML KwikPen Inject 0-16 Units into the skin See admin instructions. Inject 0-15 units 4 times daily per sliding scale: < 69 : implement hypoglycemic protocol 70-150 : 0 units 151-200 : 3 units 201-250 : 6 units 251-300 : 9 units 301-350 : 12 units 351-400 : 16 units > 401 : call MD   Yes [provider]  ipratropium-albuterol (DUONEB) 0.5-2.5 (3) MG/3ML SOLN Take 3 mLs by nebulization every 4 (four) hours as needed. 02/18/23  Yes Ghimire, Werner Lean, MD  l-methylfolate-B6-B12 (METANX) 3-35-2 MG TABS tablet Place 1 tablet into feeding tube daily. 08/23/22  Yes Setzer, Lynnell Jude, PA-C  levETIRAcetam (KEPPRA) 100 MG/ML solution Place 5 mLs (500 mg total) into feeding tube 2 (two) times daily. 01/22/23  Yes Micki Riley, MD  lidocaine 4 % Place  1 patch onto the skin daily.   Yes [provider]  metFORMIN (GLUCOPHAGE) 500 MG tablet Place 1 tablet (500 mg total) into feeding tube 2 (two) times daily with a meal. Patient taking differently: Place 500 mg into feeding tube 2 (two) times daily. 08/22/22  Yes Setzer, Lynnell Jude, PA-C  Multiple Vitamins-Minerals (PRESERVISION AREDS 2) CHEW Give 1 each by tube 2 (two) times daily.   Yes [provider]  ondansetron (ZOFRAN) 4 MG tablet Take 4 mg by mouth every 8 (eight) hours as needed for nausea or vomiting.   Yes [provider]  pantoprazole  (PROTONIX) 2 mg/mL suspension Place 40 mg into feeding tube daily.   Yes [provider]  Nutritional Supplements (FEEDING SUPPLEMENT, GLUCERNA 1.5 CAL,) LIQD Place 1,000 mLs into feeding tube daily. Patient taking differently: Place 40 mL/hr into feeding tube daily. 02/18/23   Ghimire, Werner Lean, MD  Water For Irrigation, Sterile (FREE WATER) SOLN Place 120 mLs into feeding tube every 4 (four) hours. 03/04/23   Pokhrel, Rebekah Chesterfield, MD      Allergies    Cozaar [losartan], Periogard [chlorhexidine gluconate], Pork allergy, and Vasotec [enalapril]    Review of Systems   Review of Systems  Physical Exam Updated Vital Signs BP (!) 153/76 (BP Location: Right Arm)   Pulse 90   Temp 98.8 F (37.1 C) (Oral)   Resp 20   Ht 5\' 3"  (1.6 m)   Wt 53.8 kg   SpO2 96%   BMI 21.01 kg/m  Physical Exam Vitals and nursing note reviewed.  Constitutional:      General: She is not in acute distress.    Appearance: She is well-developed. She is not ill-appearing.  HENT:     Head: Normocephalic and atraumatic.  Eyes:     Conjunctiva/sclera: Conjunctivae normal.     Pupils: Pupils are equal, round, and reactive to light.  Cardiovascular:     Rate and Rhythm: Normal rate and regular rhythm.     Pulses: Normal pulses.     Heart sounds: No murmur heard. Pulmonary:     Effort: Pulmonary effort is normal. No respiratory distress.     Breath sounds: Normal breath sounds.  Abdominal:     Palpations: Abdomen is soft.     Tenderness: There is no abdominal tenderness.  Musculoskeletal:        General: No swelling.     Cervical back: Normal range of motion and neck supple.  Skin:    General: Skin is warm and dry.     Capillary Refill: Capillary refill takes less than 2 seconds.  Neurological:     Mental Status: She is alert.     Comments: She can move her right arm some, she opens her eyes to her name little bit of right-sided facial droop, aphasia  Psychiatric:        Mood and Affect: Mood normal.      ED Results / Procedures / Treatments   Labs (all labs ordered are listed, but only abnormal results are displayed) Labs Reviewed  CULTURE, BLOOD (ROUTINE X 2) - Abnormal; Notable for the following components:      Result Value   Culture   (*)    Value: STAPHYLOCOCCUS HOMINIS THE SIGNIFICANCE OF ISOLATING THIS ORGANISM FROM A SINGLE SET OF BLOOD CULTURES WHEN MULTIPLE SETS ARE DRAWN IS UNCERTAIN. PLEASE NOTIFY THE MICROBIOLOGY DEPARTMENT WITHIN ONE WEEK IF SPECIATION AND SENSITIVITIES ARE REQUIRED. Performed at [facility], [address]  [place], [place] [address]    All other components within normal limits  BLOOD CULTURE ID PANEL (REFLEXED) - BCID2 - Abnormal; Notable for the following components:   Staphylococcus species DETECTED (*)    Staphylococcus epidermidis DETECTED (*)    Methicillin resistance mecA/C DETECTED (*)    All other components within normal limits  CBC WITH DIFFERENTIAL/PLATELET - Abnormal; Notable for the following components:   WBC 19.6 (*)    RBC 3.12 (*)    Hemoglobin 9.5 (*)    HCT 30.7 (*)    RDW 16.6 (*)    Platelets 461 (*)    Neutro Abs 18.1 (*)    Lymphs Abs 0.4 (*)    Abs Immature Granulocytes 0.08 (*)    All other components within normal limits  COMPREHENSIVE METABOLIC PANEL - Abnormal; Notable for the following components:   Sodium 133 (*)    Chloride 97 (*)    Glucose, Bld 268 (*)    BUN 28 (*)    Albumin 2.7 (*)    AST 209 (*)    ALT 212 (*)    GFR, Estimated 59 (*)    All other components within normal limits  LIPASE, BLOOD - Abnormal; Notable for the following components:   Lipase 92 (*)    All other components within normal limits  BRAIN NATRIURETIC PEPTIDE - Abnormal; Notable for the following components:   B Natriuretic Peptide 235.4 (*)    All other components within normal limits  URINALYSIS, W/ REFLEX TO CULTURE (INFECTION SUSPECTED) - Abnormal; Notable for the following components:   Specific Gravity, Urine  1.036 (*)    Protein, ur 100 (*)    All other components within normal limits  LACTIC ACID, PLASMA - Abnormal; Notable for the following components:   Lactic Acid, Venous 3.0 (*)    All other components within normal limits  GLUCOSE, CAPILLARY - Abnormal; Notable for the following components:   Glucose-Capillary 194 (*)    All other components within normal limits  GLUCOSE, CAPILLARY - Abnormal; Notable for the following components:   Glucose-Capillary 188 (*)    All other components within normal limits  GLUCOSE, CAPILLARY - Abnormal; Notable for the following components:   Glucose-Capillary 174 (*)    All other components within normal limits  GLUCOSE, CAPILLARY - Abnormal; Notable for the following components:   Glucose-Capillary 165 (*)    All other components within normal limits  GLUCOSE, CAPILLARY - Abnormal; Notable for the following components:   Glucose-Capillary 124 (*)    All other components within normal limits  CBC - Abnormal; Notable for the following components:   WBC 29.3 (*)    RBC 2.63 (*)    Hemoglobin 8.5 (*)    HCT 25.8 (*)    RDW 17.2 (*)    All other components within normal limits  LACTIC ACID, PLASMA - Abnormal; Notable for the following components:   Lactic Acid, Venous 2.1 (*)    All other components within normal limits  BLOOD GAS, ARTERIAL - Abnormal; Notable for the following components:   pO2, Arterial 71 (*)    Bicarbonate 29.2 (*)    Acid-Base Excess 4.3 (*)    All other components within normal limits  BASIC METABOLIC PANEL - Abnormal; Notable for the following components:   Glucose, Bld 142 (*)    BUN 25 (*)    All other components within normal limits  HEPATIC FUNCTION PANEL - Abnormal; Notable for the following components:   Albumin 2.4 (*)  AST 112 (*)    ALT 137 (*)    All other components within normal limits  GLUCOSE, CAPILLARY - Abnormal; Notable for the following components:   Glucose-Capillary 122 (*)    All other  components within normal limits  CBC - Abnormal; Notable for the following components:   WBC 30.6 (*)    RBC 2.44 (*)    Hemoglobin 7.6 (*)    HCT 24.1 (*)    RDW 17.2 (*)    All other components within normal limits  BASIC METABOLIC PANEL - Abnormal; Notable for the following components:   Potassium 3.3 (*)    Glucose, Bld 130 (*)    All other components within normal limits  GLUCOSE, CAPILLARY - Abnormal; Notable for the following components:   Glucose-Capillary 129 (*)    All other components within normal limits  GLUCOSE, CAPILLARY - Abnormal; Notable for the following components:   Glucose-Capillary 164 (*)    All other components within normal limits  GLUCOSE, CAPILLARY - Abnormal; Notable for the following components:   Glucose-Capillary 123 (*)    All other components within normal limits  GLUCOSE, CAPILLARY - Abnormal; Notable for the following components:   Glucose-Capillary 127 (*)    All other components within normal limits  COMPREHENSIVE METABOLIC PANEL - Abnormal; Notable for the following components:   Potassium 3.4 (*)    Total Protein 6.4 (*)    Albumin 2.2 (*)    AST 67 (*)    ALT 84 (*)    All other components within normal limits  CBC - Abnormal; Notable for the following components:   WBC 22.3 (*)    RBC 2.41 (*)    Hemoglobin 7.7 (*)    HCT 24.1 (*)    RDW 17.1 (*)    All other components within normal limits  GLUCOSE, CAPILLARY - Abnormal; Notable for the following components:   Glucose-Capillary 115 (*)    All other components within normal limits  GLUCOSE, CAPILLARY - Abnormal; Notable for the following components:   Glucose-Capillary 108 (*)    All other components within normal limits  GLUCOSE, CAPILLARY - Abnormal; Notable for the following components:   Glucose-Capillary 134 (*)    All other components within normal limits  BASIC METABOLIC PANEL - Abnormal; Notable for the following components:   Potassium 3.0 (*)    Glucose, Bld 137 (*)     All other components within normal limits  CBC - Abnormal; Notable for the following components:   WBC 12.9 (*)    RBC 2.63 (*)    Hemoglobin 8.0 (*)    HCT 26.0 (*)    RDW 16.5 (*)    All other components within normal limits  GLUCOSE, CAPILLARY - Abnormal; Notable for the following components:   Glucose-Capillary 123 (*)    All other components within normal limits  GLUCOSE, CAPILLARY - Abnormal; Notable for the following components:   Glucose-Capillary 155 (*)    All other components within normal limits  GLUCOSE, CAPILLARY - Abnormal; Notable for the following components:   Glucose-Capillary 137 (*)    All other components within normal limits  GLUCOSE, CAPILLARY - Abnormal; Notable for the following components:   Glucose-Capillary 169 (*)    All other components within normal limits  COMPREHENSIVE METABOLIC PANEL - Abnormal; Notable for the following components:   Potassium 2.6 (*)    Glucose, Bld 187 (*)    Calcium 8.8 (*)    Total Protein 6.3 (*)  Albumin 2.0 (*)    AST 42 (*)    ALT 55 (*)    All other components within normal limits  CBC - Abnormal; Notable for the following components:   WBC 14.2 (*)    RBC 2.38 (*)    Hemoglobin 7.2 (*)    HCT 23.1 (*)    RDW 16.4 (*)    All other components within normal limits  GLUCOSE, CAPILLARY - Abnormal; Notable for the following components:   Glucose-Capillary 184 (*)    All other components within normal limits  GLUCOSE, CAPILLARY - Abnormal; Notable for the following components:   Glucose-Capillary 167 (*)    All other components within normal limits  GLUCOSE, CAPILLARY - Abnormal; Notable for the following components:   Glucose-Capillary 188 (*)    All other components within normal limits  GLUCOSE, CAPILLARY - Abnormal; Notable for the following components:   Glucose-Capillary 202 (*)    All other components within normal limits  BASIC METABOLIC PANEL - Abnormal; Notable for the following components:   CO2  33 (*)    Glucose, Bld 209 (*)    Calcium 8.7 (*)    All other components within normal limits  CBC - Abnormal; Notable for the following components:   RBC 2.34 (*)    Hemoglobin 7.1 (*)    HCT 23.2 (*)    RDW 16.8 (*)    All other components within normal limits  GLUCOSE, CAPILLARY - Abnormal; Notable for the following components:   Glucose-Capillary 146 (*)    All other components within normal limits  GLUCOSE, CAPILLARY - Abnormal; Notable for the following components:   Glucose-Capillary 160 (*)    All other components within normal limits  GLUCOSE, CAPILLARY - Abnormal; Notable for the following components:   Glucose-Capillary 200 (*)    All other components within normal limits  GLUCOSE, CAPILLARY - Abnormal; Notable for the following components:   Glucose-Capillary 209 (*)    All other components within normal limits  BASIC METABOLIC PANEL - Abnormal; Notable for the following components:   Glucose, Bld 238 (*)    Calcium 8.7 (*)    All other components within normal limits  CBC - Abnormal; Notable for the following components:   RBC 2.36 (*)    Hemoglobin 7.5 (*)    HCT 23.4 (*)    RDW 16.8 (*)    All other components within normal limits  GLUCOSE, CAPILLARY - Abnormal; Notable for the following components:   Glucose-Capillary 222 (*)    All other components within normal limits  GLUCOSE, CAPILLARY - Abnormal; Notable for the following components:   Glucose-Capillary 213 (*)    All other components within normal limits  GLUCOSE, CAPILLARY - Abnormal; Notable for the following components:   Glucose-Capillary 220 (*)    All other components within normal limits  GLUCOSE, CAPILLARY - Abnormal; Notable for the following components:   Glucose-Capillary 188 (*)    All other components within normal limits  GLUCOSE, CAPILLARY - Abnormal; Notable for the following components:   Glucose-Capillary 239 (*)    All other components within normal limits  I-STAT CG4 LACTIC  ACID, ED - Abnormal; Notable for the following components:   Lactic Acid, Venous 5.4 (*)    All other components within normal limits  I-STAT CG4 LACTIC ACID, ED - Abnormal; Notable for the following components:   Lactic Acid, Venous 3.8 (*)    All other components within normal limits  I-STAT VENOUS BLOOD GAS,  ED - Abnormal; Notable for the following components:   pCO2, Ven 40.2 (*)    pO2, Ven 137 (*)    Calcium, Ion 1.11 (*)    HCT 30.0 (*)    Hemoglobin 10.2 (*)    All other components within normal limits  I-STAT CG4 LACTIC ACID, ED - Abnormal; Notable for the following components:   Lactic Acid, Venous 4.0 (*)    All other components within normal limits  I-STAT CG4 LACTIC ACID, ED - Abnormal; Notable for the following components:   Lactic Acid, Venous 3.6 (*)    All other components within normal limits  TROPONIN I (HIGH SENSITIVITY) - Abnormal; Notable for the following components:   Troponin I (High Sensitivity) 168 (*)    All other components within normal limits  TROPONIN I (HIGH SENSITIVITY) - Abnormal; Notable for the following components:   Troponin I (High Sensitivity) 160 (*)    All other components within normal limits  RESP PANEL BY RT-PCR (RSV, FLU A&B, COVID)  [id number]  CULTURE, BLOOD (ROUTINE X 2)  MRSA NEXT GEN BY PCR, NASAL  CULTURE, RESPIRATORY W GRAM STAIN  LACTIC ACID, PLASMA  MAGNESIUM  GLUCOSE, CAPILLARY  GLUCOSE, CAPILLARY  MAGNESIUM  MAGNESIUM  MAGNESIUM    EKG EKG Interpretation Date/Time:  Monday March 09 2023 07:20:47 EDT Ventricular Rate:  109 PR Interval:  191 QRS Duration:  78 QT Interval:  365 QTC Calculation: 492 R Axis:   40  Text Interpretation: Sinus tachycardia Borderline T abnormalities, inferior leads Confirmed by Virgina Norfolk [phone]) on 03/09/2023 7:49:18 AM  Radiology No results found.  Procedures .Critical Care  Performed by: Virgina Norfolk, DO Authorized by: Virgina Norfolk, DO   Critical care provider  statement:    Critical care time (minutes):  40   Critical care was necessary to treat or prevent imminent or life-threatening deterioration of the following conditions:  Sepsis   Critical care was time spent personally by me on the following activities:  Blood draw for specimens, development of treatment plan with patient or surrogate, discussions with primary provider, evaluation of patient's response to treatment, examination of patient, obtaining history from patient or surrogate, ordering and performing treatments and interventions, ordering and review of laboratory studies, ordering and review of radiographic studies, pulse oximetry, review of old charts and re-evaluation of patient's condition   I assumed direction of critical care for this patient from another provider in my specialty: no       Medications Ordered in ED Medications  potassium chloride 10 mEq in 100 mL IVPB (10 mEq Intravenous Not Given 03/14/23 1525)  vancomycin (VANCOCIN) IVPB 1000 mg/200 mL premix (0 mg Intravenous Stopped 03/09/23 1144)  ceFEPIme (MAXIPIME) 2 g in sodium chloride 0.9 % 100 mL IVPB (0 g Intravenous Stopped 03/09/23 0929)  sodium chloride 0.9 % bolus 1,000 mL (0 mLs Intravenous Stopped 03/09/23 1321)  sodium chloride 0.9 % bolus 1,000 mL (0 mLs Intravenous Stopped 03/09/23 1334)  iohexol (OMNIPAQUE) 350 MG/ML injection 75 mL (75 mLs Intravenous Contrast Given 03/09/23 1015)  potassium chloride (KLOR-CON) packet 40 mEq (40 mEq Per Tube Given 03/11/23 1211)  potassium chloride (KLOR-CON) packet 40 mEq (40 mEq Per Tube Given 03/14/23 0612)  potassium chloride 10 MEQ/100ML IVPB (10 mEq  New Bag/Given 03/14/23 1524)    ED Course/ Medical Decision Making/ A&P  Medical Decision Making Amount and/or Complexity of Data Reviewed Labs: ordered. Radiology: ordered.  Risk Prescription drug management. Decision regarding hospitalization.   Wynn Maudlin is here with nausea and  vomiting.  Unremarkable vitals.  No fever.  She appears to be at her neurologic baseline.  Per family feeding tubes were just increased recently.  Nausea and vomiting overnight.  She is on 5 L of oxygen chronically.  History of stroke with aphasia and what seems like left-sided weakness.  Talked with family on the phone and they were told that she is having some nausea and vomiting.  They thought that maybe her oxygen levels are dropping and maybe she aspirated.  Overall patient responds to her name.  She is moving her right arm.  Seems like she is at her neurologic baseline.  She has unremarkable vitals.  Differential diagnosis could be aspiration versus intra-abdominal process versus another UTI which she just was treated for or some other metabolic encephalopathy process.  I will get a head CT, chest, basic labs urinalysis and CT scan abdomen pelvis.  Per my review interpretation labs she has a leukocytosis of 19.6.  Lactic acid of 5.4.  Sepsis workup initiated after that as chest x-ray shows concern for multifocal pneumonia.  Broad-spectrum IV antibiotics started.  2 L of IV fluids given.  Troponin was elevated to 168 and BNP 235.  Overall I will send the patient to get a CT scan of the head to further evaluate for some confusion but will add a CT scan of her chest abdomen pelvis to further evaluate for pneumonia and intra-abdominal process.  She was having some nausea and vomiting.  She did have her feedings increased recently through her PEG tube.  Ultimately her head CT per radiology reports unremarkable.  I think troponin is secondary to stress reaction.  I have low suspicion for PE given white count lactic acidosis and pneumonia on chest x-ray.  I added a blood gas after she had been here for a while and overall pH is within normal limits.  There is no hypercarbia.  She has some increased work of breathing but she is on her 5 L of oxygen.  I had originally talked with family but unable to get back in  touch with them.  It seems like when she was admitted here last week she was still full code.  Right now she seems to be maintaining her airway, her mental status has been stable.  She is able to open her eyes and respond to commands.  Overall I suspect infectious process at this time.   Overall on reassessment patient is mentating well.  She is responding well to fluids.  Repeat lactic is pending.  Repeat lactate down to 3.6.  She is starting to make some urine.  I think she is starting to perfuse well.  Overall on my sepsis reassessment she is doing very well.  Does not need pressors.  Fluids have been ordered.  She is going to be admitted to hospital service for further care.   This chart was dictated using voice recognition software.  Despite best efforts to proofread,  errors can occur which can change the documentation meaning.         Final Clinical Impression(s) / ED Diagnoses Final diagnoses:  Sepsis, due to unspecified organism, unspecified whether acute organ dysfunction present (HCC)  Pneumonia due to infectious organism, unspecified laterality, unspecified part of lung    Rx / DC Orders ED Discharge Orders  None         Virgina Norfolk, DO 03/09/23 1117    Virgina Norfolk, DO 03/09/23 1136    Virgina Norfolk, DO 04/06/23 1515

## 2023-03-09 NOTE — ED Triage Notes (Signed)
Pt BIB EMS for possible aspiration. Pt aphasic at baseline. Pt had diarrhea and vomiting and O2 dropped to 74% on 5L. Pt is normally on  5L at baseline.

## 2023-03-09 NOTE — Sepsis Progress Note (Signed)
Elink monitoring for the code sepsis protocol.  

## 2023-03-10 ENCOUNTER — Inpatient Hospital Stay (HOSPITAL_COMMUNITY): Payer: Medicare HMO

## 2023-03-10 DIAGNOSIS — D259 Leiomyoma of uterus, unspecified: Secondary | ICD-10-CM | POA: Diagnosis not present

## 2023-03-10 DIAGNOSIS — Z931 Gastrostomy status: Secondary | ICD-10-CM | POA: Diagnosis not present

## 2023-03-10 DIAGNOSIS — Z7189 Other specified counseling: Secondary | ICD-10-CM

## 2023-03-10 DIAGNOSIS — J189 Pneumonia, unspecified organism: Secondary | ICD-10-CM | POA: Diagnosis not present

## 2023-03-10 DIAGNOSIS — R627 Adult failure to thrive: Secondary | ICD-10-CM

## 2023-03-10 DIAGNOSIS — R1110 Vomiting, unspecified: Secondary | ICD-10-CM | POA: Diagnosis not present

## 2023-03-10 DIAGNOSIS — G9341 Metabolic encephalopathy: Secondary | ICD-10-CM | POA: Diagnosis not present

## 2023-03-10 DIAGNOSIS — J984 Other disorders of lung: Secondary | ICD-10-CM | POA: Diagnosis not present

## 2023-03-10 DIAGNOSIS — R0603 Acute respiratory distress: Secondary | ICD-10-CM | POA: Diagnosis not present

## 2023-03-10 DIAGNOSIS — Z515 Encounter for palliative care: Secondary | ICD-10-CM

## 2023-03-10 DIAGNOSIS — A419 Sepsis, unspecified organism: Secondary | ICD-10-CM | POA: Diagnosis not present

## 2023-03-10 LAB — LACTIC ACID, PLASMA
Lactic Acid, Venous: 1.9 mmol/L (ref 0.5–1.9)
Lactic Acid, Venous: 2.1 mmol/L (ref 0.5–1.9)

## 2023-03-10 LAB — CBC
HCT: 25.8 % — ABNORMAL LOW (ref 36.0–46.0)
Hemoglobin: 8.5 g/dL — ABNORMAL LOW (ref 12.0–15.0)
MCH: 32.3 pg (ref 26.0–34.0)
MCHC: 32.9 g/dL (ref 30.0–36.0)
MCV: 98.1 fL (ref 80.0–100.0)
Platelets: 326 10*3/uL (ref 150–400)
RBC: 2.63 MIL/uL — ABNORMAL LOW (ref 3.87–5.11)
RDW: 17.2 % — ABNORMAL HIGH (ref 11.5–15.5)
WBC: 29.3 10*3/uL — ABNORMAL HIGH (ref 4.0–10.5)
nRBC: 0 % (ref 0.0–0.2)

## 2023-03-10 LAB — HEPATIC FUNCTION PANEL
ALT: 137 U/L — ABNORMAL HIGH (ref 0–44)
AST: 112 U/L — ABNORMAL HIGH (ref 15–41)
Albumin: 2.4 g/dL — ABNORMAL LOW (ref 3.5–5.0)
Alkaline Phosphatase: 83 U/L (ref 38–126)
Bilirubin, Direct: 0.1 mg/dL (ref 0.0–0.2)
Indirect Bilirubin: 0.3 mg/dL (ref 0.3–0.9)
Total Bilirubin: 0.4 mg/dL (ref 0.3–1.2)
Total Protein: 7 g/dL (ref 6.5–8.1)

## 2023-03-10 LAB — BLOOD CULTURE ID PANEL (REFLEXED) - BCID2

## 2023-03-10 LAB — BASIC METABOLIC PANEL
Anion gap: 7 (ref 5–15)
BUN: 25 mg/dL — ABNORMAL HIGH (ref 8–23)
CO2: 28 mmol/L (ref 22–32)
Calcium: 9 mg/dL (ref 8.9–10.3)
Chloride: 104 mmol/L (ref 98–111)
Creatinine, Ser: 0.74 mg/dL (ref 0.44–1.00)
GFR, Estimated: >60 mL/min (ref >60)
Glucose, Bld: 142 mg/dL — ABNORMAL HIGH (ref 70–99)
Potassium: 3.8 mmol/L (ref 3.5–5.1)
Sodium: 139 mmol/L (ref 135–145)

## 2023-03-10 LAB — GLUCOSE, CAPILLARY
Glucose-Capillary: 165 mg/dL — ABNORMAL HIGH (ref 70–99)
Glucose-Capillary: 124 mg/dL — ABNORMAL HIGH (ref 70–99)
Glucose-Capillary: 122 mg/dL — ABNORMAL HIGH (ref 70–99)
Glucose-Capillary: 129 mg/dL — ABNORMAL HIGH (ref 70–99)

## 2023-03-10 LAB — BLOOD GAS, ARTERIAL
Acid-Base Excess: 4.3 mmol/L — ABNORMAL HIGH (ref 0.0–2.0)
Bicarbonate: 29.2 mmol/L — ABNORMAL HIGH (ref 20.0–28.0)
Drawn by: 36277
O2 Saturation: 95.5 %
Patient temperature: 37.4
pCO2 arterial: 44 mmHg (ref 32–48)
pH, Arterial: 7.43 (ref 7.35–7.45)
pO2, Arterial: 71 mmHg — ABNORMAL LOW (ref 83–108)

## 2023-03-10 MED ORDER — LEVETIRACETAM IN NACL 500 MG/100ML IV SOLN
500.0000 mg | Freq: Two times a day (BID) | INTRAVENOUS | Status: DC
  Administered 2023-03-10 – 2023-03-16 (×13): 500 mg via INTRAVENOUS
  Filled 2023-03-10 (×14): qty 100

## 2023-03-10 MED ORDER — LIDOCAINE 5 % EX PTCH
1.0000 | MEDICATED_PATCH | CUTANEOUS | Status: DC
  Administered 2023-03-10 – 2023-03-15 (×5): 1 via TRANSDERMAL
  Filled 2023-03-10 (×6): qty 1

## 2023-03-10 MED ORDER — VANCOMYCIN HCL 750 MG/150ML IV SOLN
750.0000 mg | INTRAVENOUS | Status: DC
  Administered 2023-03-10: 750 mg via INTRAVENOUS
  Filled 2023-03-10 (×2): qty 150

## 2023-03-10 MED ORDER — ACETAMINOPHEN 325 MG PO TABS
650.0000 mg | ORAL_TABLET | Freq: Four times a day (QID) | ORAL | Status: DC | PRN
  Administered 2023-03-14 (×2): 650 mg
  Filled 2023-03-10 (×2): qty 2

## 2023-03-10 MED ORDER — IPRATROPIUM-ALBUTEROL 0.5-2.5 (3) MG/3ML IN SOLN
3.0000 mL | Freq: Four times a day (QID) | RESPIRATORY_TRACT | Status: DC
  Administered 2023-03-10 (×2): 3 mL via RESPIRATORY_TRACT
  Filled 2023-03-10 (×2): qty 3

## 2023-03-10 MED ORDER — ACETAMINOPHEN 325 MG PO TABS
650.0000 mg | ORAL_TABLET | Freq: Four times a day (QID) | ORAL | Status: DC | PRN
  Administered 2023-03-10: 650 mg via ORAL
  Filled 2023-03-10: qty 2

## 2023-03-10 MED ORDER — GUAIFENESIN 100 MG/5ML PO LIQD
10.0000 mL | Freq: Two times a day (BID) | ORAL | Status: DC
  Administered 2023-03-10 – 2023-03-16 (×13): 10 mL via ORAL
  Filled 2023-03-10 (×14): qty 10

## 2023-03-10 MED ORDER — IPRATROPIUM-ALBUTEROL 0.5-2.5 (3) MG/3ML IN SOLN
3.0000 mL | Freq: Four times a day (QID) | RESPIRATORY_TRACT | Status: DC
  Administered 2023-03-10 – 2023-03-11 (×4): 3 mL via RESPIRATORY_TRACT
  Filled 2023-03-10 (×4): qty 3

## 2023-03-10 MED ORDER — ORAL CARE MOUTH RINSE: 15.0000 mL | OROMUCOSAL | Status: DC | PRN

## 2023-03-10 MED ORDER — SODIUM CHLORIDE 0.9 % IV SOLN
2.0000 g | INTRAVENOUS | Status: DC
  Administered 2023-03-10 – 2023-03-12 (×3): 2 g via INTRAVENOUS
  Filled 2023-03-10 (×3): qty 20

## 2023-03-10 NOTE — Progress Notes (Signed)
Pharmacy Antibiotic Note  Gina Kelley is a 87 y.o. female admitted on 03/09/2023 with concern for pna.  Pharmacy has been consulted for vancomycin dosing.  Vancomycin loading dose given in ED SCr down to 0.74, adjusting vancomycin dose.  Plan: Adjust Vancomycin 750 mg IV q 24h (used SCr 0.8, Vd 0.72, eAUC 490.4) Monitor renal function, Cx/PCR and clinical progression to narrow Vancomycin levels as indicated  Height: 5\' 3"  (160 cm) Weight: 53.5 kg (117 lb 15.1 oz) IBW/kg (Calculated) : 52.4  Temp (24hrs), Avg:98.8 F (37.1 C), Min:97.5 F (36.4 C), Max:100.3 F (37.9 C)  Recent Labs  Lab 03/09/23 0748 03/09/23 0849 03/09/23 1008 03/09/23 1103 03/09/23 1606 03/10/23 0854 03/10/23 0958 03/10/23 1104  WBC 19.6*  --   --   --   --  29.3*  --   --   CREATININE 0.94  --   --   --   --   --  0.74  --   LATICACIDVEN  --    < > 4.0* 3.6* 3.0* 1.9  --  2.1*   < > = values in this interval not displayed.    Estimated Creatinine Clearance: 42.5 mL/min (by C-G formula based on SCr of 0.74 mg/dL).    Allergies  Allergen Reactions   Cozaar [Losartan] Swelling    Angioedema    Periogard [Chlorhexidine Gluconate] Swelling and Other (See Comments)    Skin irritation Eye/tongue/lip swelling   Pork Allergy Other (See Comments)    Unknown reaction   Vasotec [Enalapril] Cough   Antibiotics this admission: 9/9 cefepime >> 9/9 9/9 vancomycin >> 9/10 ceftriaxone >>  Cultures: 9/9 BCx: MRSE    Thank you for allowing pharmacy to be a part of this patient's care.   Signe Colt, PharmD 03/10/2023 2:19 PM  **Pharmacist phone directory can be found on [email or web address] listed under [facility]**

## 2023-03-10 NOTE — Progress Notes (Signed)
PROGRESS NOTE    Gina Kelley  SAY:[id number] DOB: 05/10/1936 DOA: 03/09/2023 PCP: Patient, No Pcp Per   Brief Narrative: 87 year old with past medical history significant for hypertension, diabetes type 2, large spontaneous subdural hematoma and intracerebral hemorrhage 06/2022 required craniotomy, status post chronic indwelling Foley catheter, chronic respiratory failure on 5 L of oxygen, history of GERD, PUD, bedbound 1 presents after noted to be hypoxic at her facility.  Patient was noted to be vomiting at the facility.   Of notes recent hospitalization from 9/1 until 9/4 for acute metabolic encephalopathy secondary to UTI.  Hospitalized 8/16 until 8/21st for sepsis secondary to aspiration pneumonia.  No time was recommended the patient continue with PEG tube feeds.   Evaluation in the ED patient was found to be tachycardic, tachypnea, leukocytosis white blood cell 19, increased lipase, AST 219 ALT 212.  Troponin 168, lactic acid 5.4 trending down to 3.6.  CT head acutely stable right intraparenchymal hemorrhage.  CT abdomen chest pelvis showed bilateral airspace opacity compatible with multifocal pneumonia.  Patient admitted for aspiration pneumonia.  She was started transiently overnight on tube feedings and appears to have another aspiration episode.   Assessment & Plan:   Principal Problem:   Sepsis due to pneumonia [facility]) Active Problems:   Chronic respiratory failure with hypoxia (HCC)   Elevated troponin   S/P craniotomy   History of intracranial hemorrhage   Dysphagia   Acute metabolic encephalopathy   Normocytic anemia   Essential hypertension   Type 2 diabetes mellitus (HCC)   Transaminitis   Seizure disorder (HCC)   Hyperlipidemia   1-Sepsis secondary to aspiration pneumonia Acute on Chronic hypoxic respiratory failure -Patient presenting hypoxic oxygen saturation was noted to be at 78 on 5 L, leukocytosis, tachycardia, tachypnea, CT chest showed multifocal  pneumonia.- -COVID PCR negative RSV screening negative. -With a lactic acid of 5.4 down to 3 -Addition, add nebulizer -Continue with IV vancomycin and ceftriaxone -Will avoid cefepime in to avoid encephalopathy -Continue holding tube feedings due to recurrent aspiration events.  Could resume tube feedings when respiratory status is more stable. -Follow blood cultures -Respiratory  toilet  1-of 2 Blood culture, Staph Epidermis methicillin resistance positive.  Continue with Vancomycin.  Repeat Blood culture.   Acute metabolic encephalopathy: -In the setting of hypoxia, infection, delirium, history of large subdural hematoma -She seems more lethargic this morning, nurse reported last night patient likely had an aspiration event after she was restarted on tube feedings.  Of note patient was also lethargic on admission. -ABG repeated normal pH and pCO2 -CT head no acute intracranial abnormality, unchanged sequela from intraparenchymal hemorrhage in the right frontal lobe.  Elevation of troponin 168...> 160 Demand in setting of sepsis.    History of ICH/SDH s/p craniotomy; Dysphagia: -Patient with history of ICH requiring craniotomy in January 2024. -Residual left-sided hemiparesis/dysphagia requiring PEG tube, dysarthria, bedbound. -She had what appears to be another aspiration episode last night (9/09).  Will plan to hold tube feedings for at least 48 hours until respiratory status is stable. -Check KUB   Normocytic Anemia: Follow trend With ferrous sulfate She has been evaluated by GI in the past and was  thought not to be a candidate for endoscopy  Essential hypertension Blood pressures initially elevated up to 172/101. -Continue Norvasc and Coreg   Diabetes mellitus type 2, with long-term use of insulin On admission glucose elevated up to 268. -Hold metformin -SSI   Transaminitis Acute on chronic.  AST 209 and ALT  212 acutely elevated from prior with lipase elevated as  well.  CT did not note any acute intra-abdominal process. -Trending down.  Likely in the setting of sepsis   History of bladder outlet obstruction -Monitor postvoid residuals    Seizure disorder -Continue Keppra IV   Hyperlipidemia  -Held atorvastatin due to acute liver dysfunction   Depression -Continue Prozac   GERD -Continue Protonix   Estimated body mass index is 20.89 kg/m as calculated from the following:   Height as of this encounter: 5\' 3"  (1.6 m).   Weight as of this encounter: 53.5 kg.   DVT prophylaxis: SCD Code Status: Full Code  Family Communication: Daughter over phone Disposition Plan:  Status is: Inpatient Remains inpatient appropriate because: management of aspiration PNA, Sepsis.     Consultants:  Palliative care  Procedures:    Antimicrobials:    Subjective: Lethargic, open eyes.   Objective: Vitals:   03/10/23 0500 03/10/23 0600 03/10/23 0700 03/10/23 0705  BP: 118/68 133/66 125/65 134/65  Pulse: 92 89 91 91  Resp:  14 (!) 21 19  Temp: 99.3 F (37.4 C)  99.3 F (37.4 C)   TempSrc:    Oral  SpO2: 98% 98% 98% 98%  Weight:      Height:        Intake/Output Summary (Last 24 hours) at 03/10/2023 0805 Last data filed at 03/10/2023 0600 Gross per 24 hour  Intake 1970.67 ml  Output 690 ml  Net 1280.67 ml   Filed Weights   03/09/23 2014 03/10/23 0300  Weight: 53.4 kg 53.5 kg    Examination:  General exam: Lethargic Respiratory system: BL ronchus Cardiovascular system: S1 & S2 heard, RRR.  Gastrointestinal system: Abdomen is nondistended, soft and nontender. Peg tube Central nervous system: Lethargic Extremities: no edema  Data Reviewed: I have personally reviewed following labs and imaging studies  CBC: Recent Labs  Lab 03/09/23 0748 03/09/23 1012  WBC 19.6*  --   NEUTROABS 18.1*  --   HGB 9.5* 10.2*  HCT 30.7* 30.0*  MCV 98.4  --   PLT 461*  --    Basic Metabolic Panel: Recent Labs  Lab 03/09/23 0748  03/09/23 1012  NA 133* 138  K 4.0 4.0  CL 97*  --   CO2 25  --   GLUCOSE 268*  --   BUN 28*  --   CREATININE 0.94  --   CALCIUM 8.9  --    GFR: Estimated Creatinine Clearance: 36.2 mL/min (by C-G formula based on SCr of 0.94 mg/dL). Liver Function Tests: Recent Labs  Lab 03/09/23 0748  AST 209*  ALT 212*  ALKPHOS 123  BILITOT 0.4  PROT 7.6  ALBUMIN 2.7*   Recent Labs  Lab 03/09/23 0748  LIPASE 92*   No results for input(s): "AMMONIA" in the last 168 hours. Coagulation Profile: No results for input(s): "INR", "PROTIME" in the last 168 hours. Cardiac Enzymes: No results for input(s): "CKTOTAL", "CKMB", "CKMBINDEX", "TROPONINI" in the last 168 hours. BNP (last 3 results) No results for input(s): "PROBNP" in the last 8760 hours. HbA1C: No results for input(s): "HGBA1C" in the last 72 hours. CBG: Recent Labs  Lab 03/09/23 1817 03/09/23 2011 03/09/23 2313 03/10/23 0127 03/10/23 0757  GLUCAP 194* 188* 174* 165* 124*   Lipid Profile: No results for input(s): "CHOL", "HDL", "LDLCALC", "TRIG", "CHOLHDL", "LDLDIRECT" in the last 72 hours. Thyroid Function Tests: No results for input(s): "TSH", "T4TOTAL", "FREET4", "T3FREE", "THYROIDAB" in the last 72 hours. Anemia Panel:  No results for input(s): "VITAMINB12", "FOLATE", "FERRITIN", "TIBC", "IRON", "RETICCTPCT" in the last 72 hours. Sepsis Labs: Recent Labs  Lab 03/09/23 1004 03/09/23 1008 03/09/23 1103 03/09/23 1606  LATICACIDVEN 3.8* 4.0* 3.6* 3.0*    Recent Results (from the past 240 hour(s))  Culture, blood (routine x 2)     Status: None   Collection Time: 03/01/23  6:00 PM   Specimen: BLOOD  Result Value Ref Range Status   Specimen Description BLOOD SITE NOT SPECIFIED  Final   Special Requests   Final    BOTTLES DRAWN AEROBIC AND ANAEROBIC Blood Culture results may not be optimal due to an excessive volume of blood received in culture bottles   Culture   Final    NO GROWTH 5 DAYS Performed at [facility], [address], [place], [place] [address]    Report Status 03/06/2023 FINAL  Final  SARS Coronavirus 2 by RT PCR (hospital order, performed in [facility] hospital lab) *cepheid single result test* Peripheral     Status: None   Collection Time: 03/01/23  6:04 PM   Specimen: Peripheral; Nasal Swab  Result Value Ref Range Status   SARS Coronavirus 2 by RT PCR NEGATIVE NEGATIVE Final    Comment: Performed at [facility], [address] [place], [place] [address]  Culture, blood (routine x 2)     Status: None   Collection Time: 03/01/23  6:42 PM   Specimen: BLOOD  Result Value Ref Range Status   Specimen Description BLOOD BLOOD RIGHT HAND  Final   Special Requests   Final    BOTTLES DRAWN AEROBIC AND ANAEROBIC Blood Culture adequate volume   Culture   Final    NO GROWTH 5 DAYS Performed at [place] [facility], [address], [place], [place] [address]    Report Status 03/06/2023 FINAL  Final  Urine Culture     Status: Abnormal (Preliminary result)   Collection Time: 03/02/23  7:38 AM   Specimen: Urine, Clean Catch  Result Value Ref Range Status   Specimen Description URINE, CLEAN CATCH  Final   Special Requests NONE  Final   Culture (A)  Final    >=100,000 COLONIES/mL CITROBACTER YOUNGAE >=100,000 COLONIES/mL ENTEROCOCCUS FAECALIS Sent to [facility] for further susceptibility testing. Performed at [facility], [address] [place], [place] [address]    Report Status PENDING  Incomplete   Organism ID, Bacteria CITROBACTER YOUNGAE (A)  Final      Susceptibility   Citrobacter youngae - MIC*    CEFEPIME 0.5 SENSITIVE Sensitive     CEFTRIAXONE >=64 RESISTANT Resistant     CIPROFLOXACIN <=0.25 SENSITIVE Sensitive     GENTAMICIN <=1 SENSITIVE Sensitive     IMIPENEM <=0.25 SENSITIVE Sensitive     NITROFURANTOIN 32 SENSITIVE Sensitive     TRIMETH/SULFA <=20 SENSITIVE Sensitive     PIP/TAZO 64 INTERMEDIATE Intermediate     * >=100,000 COLONIES/mL  CITROBACTER YOUNGAE  Susceptibility, Aer + Anaerob     Status: Abnormal   Collection Time: 03/02/23  7:38 AM  Result Value Ref Range Status   Suscept, Aer + Anaerob Preliminary report (A)  Final    Comment: (NOTE) Performed At: [facility] [address] [place], [place] [id number] Jolene Schimke MD LO:[id number]    Source of Sample ENTEROCOCCUS FAECALIS/ URINE  Final    Comment: Performed at [facility], [address], [place], [place] [address]  Susceptibility Result  Status: Abnormal   Collection Time: 03/02/23  7:38 AM  Result Value Ref Range Status   Suscept Result 1 Enterococcus faecalis (A)  Final    Comment: (NOTE) Identification performed by account, not confirmed by this laboratory. Performed At: [facility] [address] [place], [place] [id number] Jolene Schimke MD WU:[id number]   Resp panel by RT-PCR (RSV, Flu A&B, Covid) Anterior Nasal Swab     Status: None   Collection Time: 03/09/23  7:23 AM   Specimen: Anterior Nasal Swab  Result Value Ref Range Status   SARS Coronavirus 2 by RT PCR NEGATIVE NEGATIVE Final   Influenza A by PCR NEGATIVE NEGATIVE Final   Influenza B by PCR NEGATIVE NEGATIVE Final    Comment: (NOTE) The Xpert Xpress SARS-CoV-2/FLU/RSV plus assay is intended as an aid in the diagnosis of influenza from Nasopharyngeal swab specimens and should not be used as a sole basis for treatment. Nasal washings and aspirates are unacceptable for Xpert Xpress SARS-CoV-2/FLU/RSV testing.  Fact Sheet for Patients: BloggerCourse.com  Fact Sheet for Healthcare Providers: SeriousBroker.it  This test is not yet approved or cleared by the [place] FDA and has been authorized for detection and/or diagnosis of SARS-CoV-2 by FDA under an Emergency Use Authorization (EUA). This EUA will remain in effect (meaning this test can be used) for the duration of the COVID-19 declaration  under Section 564(b)(1) of the Act, 21 U.S.C. section 360bbb-3(b)(1), unless the authorization is terminated or revoked.     Resp Syncytial Virus by PCR NEGATIVE NEGATIVE Final    Comment: (NOTE) Fact Sheet for Patients: BloggerCourse.com  Fact Sheet for Healthcare Providers: SeriousBroker.it  This test is not yet approved or cleared by the [place] FDA and has been authorized for detection and/or diagnosis of SARS-CoV-2 by FDA under an Emergency Use Authorization (EUA). This EUA will remain in effect (meaning this test can be used) for the duration of the COVID-19 declaration under Section 564(b)(1) of the Act, 21 U.S.C. section 360bbb-3(b)(1), unless the authorization is terminated or revoked.  Performed at [facility], [address] [place], [place] [address]   Blood culture (routine x 2)     Status: None (Preliminary result)   Collection Time: 03/09/23  8:40 AM   Specimen: BLOOD LEFT ARM  Result Value Ref Range Status   Specimen Description BLOOD LEFT ARM  Final   Special Requests   Final    BOTTLES DRAWN AEROBIC AND ANAEROBIC Blood Culture adequate volume   Culture  Setup Time   Final    GRAM POSITIVE COCCI IN CLUSTERS ANAEROBIC BOTTLE ONLY CRITICAL RESULT CALLED TO, READ BACK BY AND VERIFIED WITH: PHARMD J. LEDFORD 03/10/23 @ 0606 BY AB Performed at [facility], [address] [place], [place] [address]    Culture GRAM POSITIVE COCCI  Final   Report Status PENDING  Incomplete  Blood Culture ID Panel (Reflexed)     Status: Abnormal   Collection Time: 03/09/23  8:40 AM  Result Value Ref Range Status   Enterococcus faecalis NOT DETECTED NOT DETECTED Final   Enterococcus Faecium NOT DETECTED NOT DETECTED Final   Listeria monocytogenes NOT DETECTED NOT DETECTED Final   Staphylococcus species DETECTED (A) NOT DETECTED Final    Comment: CRITICAL RESULT CALLED TO, READ BACK BY AND VERIFIED WITH: PHARMD  J. LEDFORD 03/10/23 @ 0606 BY AB    Staphylococcus aureus (BCID) NOT DETECTED NOT DETECTED Final   Staphylococcus epidermidis DETECTED (A) NOT DETECTED Final  Comment: Methicillin (oxacillin) resistant coagulase negative staphylococcus. Possible blood culture contaminant (unless isolated from more than one blood culture draw or clinical case suggests pathogenicity). No antibiotic treatment is indicated for blood  culture contaminants. CRITICAL RESULT CALLED TO, READ BACK BY AND VERIFIED WITH: PHARMD J. LEDFORD 03/10/23 @ 0606 BY AB    Staphylococcus lugdunensis NOT DETECTED NOT DETECTED Final   Streptococcus species NOT DETECTED NOT DETECTED Final   Streptococcus agalactiae NOT DETECTED NOT DETECTED Final   Streptococcus pneumoniae NOT DETECTED NOT DETECTED Final   Streptococcus pyogenes NOT DETECTED NOT DETECTED Final   A.calcoaceticus-baumannii NOT DETECTED NOT DETECTED Final   Bacteroides fragilis NOT DETECTED NOT DETECTED Final   Enterobacterales NOT DETECTED NOT DETECTED Final   Enterobacter cloacae complex NOT DETECTED NOT DETECTED Final   Escherichia coli NOT DETECTED NOT DETECTED Final   Klebsiella aerogenes NOT DETECTED NOT DETECTED Final   Klebsiella oxytoca NOT DETECTED NOT DETECTED Final   Klebsiella pneumoniae NOT DETECTED NOT DETECTED Final   Proteus species NOT DETECTED NOT DETECTED Final   Salmonella species NOT DETECTED NOT DETECTED Final   Serratia marcescens NOT DETECTED NOT DETECTED Final   Haemophilus influenzae NOT DETECTED NOT DETECTED Final   Neisseria meningitidis NOT DETECTED NOT DETECTED Final   Pseudomonas aeruginosa NOT DETECTED NOT DETECTED Final   Stenotrophomonas maltophilia NOT DETECTED NOT DETECTED Final   Candida albicans NOT DETECTED NOT DETECTED Final   Candida auris NOT DETECTED NOT DETECTED Final   Candida glabrata NOT DETECTED NOT DETECTED Final   Candida krusei NOT DETECTED NOT DETECTED Final   Candida parapsilosis NOT DETECTED NOT DETECTED  Final   Candida tropicalis NOT DETECTED NOT DETECTED Final   Cryptococcus neoformans/gattii NOT DETECTED NOT DETECTED Final   Methicillin resistance mecA/C DETECTED (A) NOT DETECTED Final    Comment: CRITICAL RESULT CALLED TO, READ BACK BY AND VERIFIED WITH: PHARMD J. LEDFORD 03/10/23 @ 0606 BY AB Performed at [facility], [address], [place], [place] [address]   Culture, Respiratory w Gram Stain     Status: None (Preliminary result)   Collection Time: 03/09/23  3:46 PM   Specimen: Tracheal Aspirate  Result Value Ref Range Status   Specimen Description TRACHEAL ASPIRATE  Final   Special Requests NONE  Final   Gram Stain   Final    NO WBC SEEN FEW BUDDING YEAST SEEN Performed at [facility], [address] [place], [place] [address]    Culture PENDING  Incomplete   Report Status PENDING  Incomplete  MRSA Next Gen by PCR, Nasal     Status: None   Collection Time: 03/09/23  8:29 PM   Specimen: Nasal Mucosa; Nasal Swab  Result Value Ref Range Status   MRSA by PCR Next Gen NOT DETECTED NOT DETECTED Final    Comment: (NOTE) The GeneXpert MRSA Assay (FDA approved for NASAL specimens only), is one component of a comprehensive MRSA colonization surveillance program. It is not intended to diagnose MRSA infection nor to guide or monitor treatment for MRSA infections. Test performance is not FDA approved in patients less than 55 years old. Performed at [facility], [address] [place], [place] [address]          Radiology Studies: DG CHEST PORT 1 VIEW  Result Date: 03/10/2023 CLINICAL DATA:  Acute respiratory distress EXAM: PORTABLE CHEST 1 VIEW COMPARISON:  Chest x-ray 03/09/2023 FINDINGS: Bilateral airspace disease has not significantly changed. Costophrenic angles are clear. There is no  pneumothorax or pleural effusion. The cardiomediastinal silhouette is within normal limits. No acute fractures are seen. IMPRESSION: Bilateral airspace disease has not  significantly changed. Electronically Signed   By: Darliss Cheney M.D.   On: 03/10/2023 03:59   CT CHEST ABDOMEN PELVIS W CONTRAST  Result Date: 03/09/2023 CLINICAL DATA:  Sepsis.  Mental status change. EXAM: CT CHEST, ABDOMEN, AND PELVIS WITH CONTRAST TECHNIQUE: Multidetector CT imaging of the chest, abdomen and pelvis was performed following the standard protocol during bolus administration of intravenous contrast. RADIATION DOSE REDUCTION: This exam was performed according to the departmental dose-optimization program which includes automated exposure control, adjustment of the mA and/or kV according to patient size and/or use of iterative reconstruction technique. CONTRAST:  75mL OMNIPAQUE IOHEXOL 350 MG/ML SOLN COMPARISON:  View chest x-ray 03/09/2023 FINDINGS: CT CHEST FINDINGS Cardiovascular: The heart size is normal. Coronary artery calcifications are present. Atherosclerotic changes are present at the aortic arch great vessel origins without aneurysm or focal stenosis. Pulmonary artery size is normal. No filling defects are present. Mediastinum/Nodes: Multiple right paratracheal lymph nodes measure up to 8 mm. Mild bilateral hilar adenopathy is present. The thoracic inlet is within normal limits. The upper soft a Gus is gas-filled. No obstructing lesion is present. Lungs/Pleura: Patchy bilateral airspace opacities are confirmed. Consolidated airspace disease is more prominent in the lower lobes, right greater than left. The airways are patent. No focal mass lesion is present. No significant pleural disease is present. Musculoskeletal: Vertebral body heights and alignment are normal. Mild anterior degenerative changes are present in the upper thoracic spine. The thoracic kyphosis is normal. The ribs are unremarkable. CT ABDOMEN PELVIS FINDINGS Hepatobiliary: Mild intra and extrahepatic biliary dilation is within normal limits following cholecystectomy. No focal hepatic lesions are present. Pancreas:  Unremarkable. No pancreatic ductal dilatation or surrounding inflammatory changes. Spleen: Normal in size without focal abnormality. Adrenals/Urinary Tract: The kidneys are unremarkable. No stone or mass lesion is present. Obstruction is present. The ureters are normal bilaterally. The urinary bladder is within normal limits. Stomach/Bowel: Stomach and duodenum scratched at a gastrostomy tube is in place. The stomach and duodenum are otherwise within normal limits. The small bowel is unremarkable. The terminal ileum is normal. The ascending and transverse colon are normal. The descending and sigmoid colon are within normal limits. Vascular/Lymphatic: Atherosclerotic calcifications are present in the aorta and branch vessels. No aneurysm is present. Reproductive: 5 cm calcified fibroid is present posteriorly on the left. The uterus and adnexa are otherwise unremarkable. Other: A 15 mm paraumbilical hernia contains fat without bowel. No other significant ventral hernia is present. No free fluid or free air is present. Musculoskeletal: Degenerative changes are present in the lower lumbar spine with slight anterolisthesis at L4-5. No focal osseous lesions are present. IMPRESSION: 1. Patchy bilateral airspace opacities compatible with multifocal pneumonia. 2. Mild bilateral hilar and mediastinal adenopathy is likely reactive. 3. Coronary artery disease. 4. 5 cm calcified fibroid posteriorly on the left. 5. 15 mm paraumbilical hernia contains fat without bowel. 6. Degenerative changes in the lower lumbar spine with slight anterolisthesis at L4-5. 7.  Aortic Atherosclerosis (ICD10-I70.0). Electronically Signed   By: Marin Roberts M.D.   On: 03/09/2023 11:36   CT Head Wo Contrast  Result Date: 03/09/2023 CLINICAL DATA:  Mental status change, unknown cause. EXAM: CT HEAD WITHOUT CONTRAST TECHNIQUE: Contiguous axial images were obtained from the base of the skull through the vertex without intravenous contrast.  RADIATION DOSE REDUCTION: This exam was performed according to the  departmental dose-optimization program which includes automated exposure control, adjustment of the mA and/or kV according to patient size and/or use of iterative reconstruction technique. COMPARISON:  Head CT 03/01/2023.  MRI brain 03/02/2023. FINDINGS: Brain: No acute intracranial hemorrhage. Unchanged sequela of prior intraparenchymal hemorrhage in the right frontal lobe. Stable background of moderate chronic small-vessel disease with old lacunar infarcts in the bilateral basal ganglia. No acute hydrocephalus or extra-axial collection. No mass effect or midline shift. Vascular: No hyperdense vessel or unexpected calcification. Skull: Prior right frontotemporal craniotomy. No acute calvarial fracture. Skull base is unremarkable. Sinuses/Orbits: Trace fluid in the sphenoid sinuses. Orbits are unremarkable. Other: None. IMPRESSION: 1. No acute intracranial abnormality. 2. Unchanged sequela of prior intraparenchymal hemorrhage in the right frontal lobe. 3. Stable background of moderate chronic small-vessel disease with old lacunar infarcts in the bilateral basal ganglia. Electronically Signed   By: Orvan Falconer M.D.   On: 03/09/2023 11:04   DG Chest Portable 1 View  Result Date: 03/09/2023 CLINICAL DATA:  Cough.  Possible aspiration.  Vomiting. EXAM: PORTABLE CHEST 1 VIEW COMPARISON:  03/01/2023 FINDINGS: Interval development of patchy and nodular airspace disease in both upper lungs. More confluent airspace consolidative opacity is seen in the right hilar region. Stable asymmetric elevation right hemidiaphragm. Cardiopericardial silhouette is at upper limits of normal for size. Telemetry leads overlie the chest. IMPRESSION: Interval development of patchy and nodular airspace disease in both upper lungs with more confluent airspace disease in the right hilar region. Imaging features are compatible with multifocal pneumonia. Despite the prominent  nodularity of these acute findings, rapid interval development would make metastatic disease unlikely. Electronically Signed   By: Kennith Center M.D.   On: 03/09/2023 08:05        Scheduled Meds:  amLODipine  10 mg Per Tube Daily   carvedilol  6.25 mg Per Tube BID WC   feeding supplement (GLUCERNA 1.5 CAL)  40 mL/hr Per Tube Q24H   ferrous sulfate  300 mg Per Tube Daily   FLUoxetine  20 mg Per Tube QHS   gabapentin  100 mg Per Tube Q8H   insulin aspart  0-15 Units Subcutaneous Q6H   ipratropium-albuterol  3 mL Nebulization Q6H   levETIRAcetam  500 mg Per Tube BID   pantoprazole (PROTONIX) IV  40 mg Intravenous Q24H   sodium chloride flush  3 mL Intravenous Q12H   Continuous Infusions:  sodium chloride Stopped (03/09/23 2348)   cefTRIAXone (ROCEPHIN)  IV     vancomycin       LOS: 1 day    Time spent: 35 minutes    Rommie Dunn A Areg Bialas, MD [facility] Hospitalists   If 7PM-7AM, please contact night-coverage [email or web address]  03/10/2023, 8:05 AM

## 2023-03-10 NOTE — Consult Note (Signed)
Consultation Note Date: 03/10/2023   Patient Name: Gina Kelley  DOB: [birth year]  MRN: [id number]  Age / Sex: 87 y.o., female  PCP: Patient, No Pcp Per Referring Physician: Alba Cory, MD  Reason for Consultation: Establishing goals of care and Psychosocial/spiritual support  HPI/Patient Profile: 87 y.o. female  with past medical history of *** admitted on 03/09/2023 with ***.   medical history significant of HTN, DM type II, large spontaneous subdural hematoma and intracerebral hemorrhage in 06/2022 requiring craniotomy, status post chronic indwelling Foley catheter and pain, and chronic  respiratory failure on 5 L of nasal cannula oxygen, history of GERD PUD, bed bound presents after having drop in her O2 saturations.  She is obtained from review of records and family present at bedside.  She just recently been hospitalized from 9/1-9/4 acute metabolic encephalopathy thought secondary to a Citrobacter UTI.  Prior to that patient had been hospitalized 8/16-8/21 with sepsis secondary to aspiration pneumonia.  Patient was evaluated by speech and recommended to remain n.p.o. PEG tube feeds.  Family had made note that her sleep issues have recently been increased from 45 mL/h up to 55 mL/h due to weight loss since hospitalization.  Family notes patient no longer needs assistance with eating.  She had recently had episodes of nausea and vomiting.  Staff noted O2 saturations dropped to 74% on 5 L and called EMS.   In the emergency department patient was noted to have temperature of 99.3 F with tachycardia and tachypnea, and all other vital signs relatively maintained.  Labs significant for WBC 19.6, hemoglobin 9.5, platelets 461, sodium 133, chloride 97, BUN 28, creatinine 0.94, glucose 268, lipase 92, AST 209, ALT 212, BNP 235.4, troponin 168, and lactic acid 5.4-> 3.8-> 4-> 3.6.  CT scan of the head acute stable  sequela of right intraparenchymal hemorrhage.  CT scan of the chest abdomen pelvis noted patchy by lateral airspace opacities compatible with multifocal pneumonia with bilateral patient had been bolused 2 L of normal saline IV fluids given vancomycin, and cefepime.  TRH called to admit. Clinical Assessment and Goals of Care: *** This NP Lorinda Creed reviewed medical records, received report from team, assessed the patient and then meet at the patient's bedside  to discuss diagnosis, prognosis, [name], EOL wishes disposition and options.   Concept of Palliative Care was introduced as specialized medical care for people and their families living with serious illness.  If focuses on providing relief from the symptoms and stress of a serious illness.  The goal is to improve quality of life for both the patient and the family.  Created space and opportunity for patient  and family to explore thoughts and feelings regarding current medical situation     A  discussion was had today regarding advanced directives.  Concepts specific to code status, artifical feeding and hydration, continued IV antibiotics and rehospitalization was had.  The difference between a aggressive medical intervention path  and a palliative comfort care path for this patient at this time was  had.  Values and goals of care important to patient and family were attempted to be elicited.   MOST form    Natural trajectory and expectations at EOL were discussed.  Questions and concerns addressed.  Patient  encouraged to call with questions or concerns.     PMT will continue to support holistically.             {Primary Decision BJYNW:29562}  SUMMARY OF RECOMMENDATIONS   *** Code Status/Advance Care Planning: {Palliative Code status:23503}   Symptom Management:  ***  Palliative Prophylaxis:  {Palliative Prophylaxis:21015}  Additional Recommendations (Limitations, Scope, Preferences): {Recommended Scope and  Preferences:21019}  Psycho-social/Spiritual:  Desire for further Chaplaincy support:{YES NO:22349} Additional Recommendations: {PAL SOCIAL:21064}  Prognosis:  {Palliative Care Prognosis:23504}  Discharge Planning: {Palliative dispostion:23505}      Primary Diagnoses: Present on Admission:  Sepsis due to pneumonia (HCC)  Chronic respiratory failure with hypoxia (HCC)  Acute metabolic encephalopathy  Hyperlipidemia  Transaminitis  Essential hypertension  Normocytic anemia  Elevated troponin   I have reviewed the medical record, interviewed the patient and family, and examined the patient. The following aspects are pertinent.  Past Medical History:  Diagnosis Date   Anemia of chronic disease 07/08/2006   Antral ulcer 02/23/2007   Seen on EGD in 2008, small ulcer with erosion    Cerebral vascular accident (HCC) 04/14/2006   1998, left lower extremity numbness, no residual deficits    Essential hypertension 04/14/2006   Gastroesophageal reflux disease 02/18/2013   Occasional, symptomatically relieved with peptobismol    Healthcare maintenance 12/04/2011   Hyperlipidemia LDL goal < 100 04/14/2006   Hypertensive retinopathy of both eyes, grade 1 05/15/2016   Osteopenia of right femoral neck 10/20/2016   DEXA (10/16/2016): R femur T -2.5 (FRAX tool calculates at -2.4), L1-L4 spine T -0.9, 10 year risk for: Major osteoporotic fracture 8.3%, Hip fracture 2.7%   Seizure disorder (HCC) 03/01/2023   Type 2 diabetes mellitus with stage 1 chronic kidney disease, without long-term current use of insulin (HCC)    Type II diabetes mellitus (HCC) 04/14/2006   Social History   Socioeconomic History   Marital status: Married    Spouse name: Not on file   Number of children: Not on file   Years of education: Not on file   Highest education level: Not on file  Occupational History   Occupation: Retired     Comment: [employer]  Tobacco Use   Smoking status: Former    Current packs/day:  0.00    Types: Cigarettes    Quit date: 06/30/1978    Years since quitting: 44.7   Smokeless tobacco: Never  Vaping Use   Vaping status: Never Used  Substance and Sexual Activity   Alcohol use: No   Drug use: No   Sexual activity: Not Currently  Other Topics Concern   Not on file  Social History Narrative   Current Social History 09/07/2020        Patient lives with husband in one level home with 5 outside steps with handrails on both sides        Patient's method of transportation is personal car (drives herself)      The highest level of education was 9 th grade      The patient currently retired from [employer] (due to [employer]).      Identified important Relationships are, "My family"       Pets : None       Interests / Fun: "  Go to [place], sing, read. Went to gym before [employer]."       Current Stressors: "I don't have any. 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Ducatte, BSN, RN-BC       Social Determinants of Health   Financial Resource Strain: Not on file  Food Insecurity: No Food Insecurity (03/02/2023)   Hunger Vital Sign    Worried About Running Out of Food in the Last Year: Never true    Ran Out of Food in the Last Year: Never true  Transportation Needs: No Transportation Needs (03/02/2023)   PRAPARE - Administrator, Civil Service (Medical): No    Lack of Transportation (Non-Medical): No  Physical Activity: Not on file  Stress: Not on file  Social Connections: Not on file   Family History  Problem Relation Age of Onset   Stroke Mother    Diabetes Mother    Hypertension Mother    Anuerysm Father 12       Cerebral   Asthma Sister    Breast cancer Sister    Pulmonary disease Brother        Black lung   Stroke Sister    Cirrhosis Brother     Hypertension Son    Asthma Brother    Scheduled Meds:  carvedilol  6.25 mg Per Tube BID WC   feeding supplement (GLUCERNA 1.5 CAL)  40 mL/hr Per Tube Q24H   ferrous sulfate  300 mg Per Tube Daily   FLUoxetine  20 mg Per Tube QHS   gabapentin  100 mg Per Tube Q8H   guaiFENesin  10 mL Oral BID   insulin aspart  0-15 Units Subcutaneous Q6H   ipratropium-albuterol  3 mL Nebulization Q6H   pantoprazole (PROTONIX) IV  40 mg Intravenous Q24H   sodium chloride flush  3 mL Intravenous Q12H   Continuous Infusions:  sodium chloride Stopped (03/09/23 2348)   cefTRIAXone (ROCEPHIN)  IV     levETIRAcetam 500 mg (03/10/23 1116)   vancomycin     PRN Meds:.sodium chloride, acetaminophen, albuterol, influenza vaccine adjuvanted, mouth rinse, trimethobenzamide Medications Prior to Admission:  Prior to Admission medications   Medication Sig Start Date End Date Taking? Authorizing Provider  acetaminophen (TYLENOL) 500 MG tablet Place 500 mg into feeding tube 4 (four) times daily as needed for mild pain or moderate pain.   Yes [provider]  acetaminophen (TYLENOL) 500 MG tablet Place 1,000 mg into feeding tube in the morning and at bedtime.   Yes [provider]  amLODipine (NORVASC) 10 MG tablet Place 1 tablet (10 mg total) into feeding tube daily. 08/23/22  Yes Setzer, Lynnell Jude, PA-C  ascorbic acid (VITAMIN C) 500 MG tablet Place 1 tablet (500 mg total) into feeding tube daily. 08/23/22  Yes Setzer, Lynnell Jude, PA-C  atorvastatin (LIPITOR) 80 MG tablet Place 1 tablet (80 mg total) into feeding tube daily. 08/23/22  Yes Setzer, Lynnell Jude, PA-C  carvedilol (COREG) 6.25 MG tablet Place 1 tablet (6.25 mg total) into feeding tube 2 (two) times daily with a meal. 08/22/22  Yes Setzer, Lynnell Jude, PA-C  ezetimibe (ZETIA) 10 MG tablet Place 1 tablet (10 mg total) into feeding tube  daily. 08/23/22  Yes Setzer, Lynnell Jude, PA-C  famotidine (PEPCID) 20 MG tablet Place 1 tablet (20 mg total) into feeding  tube 2 (two) times daily. 08/22/22  Yes Setzer, Lynnell Jude, PA-C  ferrous sulfate 220 (44 Fe) MG/5ML solution Place 408 mg into feeding tube daily.   Yes [provider]  FLUoxetine (PROZAC) 20 MG capsule Place 1 capsule (20 mg total) into feeding tube at bedtime. 08/22/22  Yes Setzer, Lynnell Jude, PA-C  gabapentin (NEURONTIN) 250 MG/5ML solution Place 2 mLs (100 mg total) into feeding tube every 8 (eight) hours. 08/22/22  Yes Setzer, Lynnell Jude, PA-C  guaiFENesin (ROBITUSSIN) 100 MG/5ML liquid Place 200 mg into feeding tube 4 (four) times daily.   Yes [provider]  insulin lispro (HUMALOG) 100 UNIT/ML KwikPen Inject 0-16 Units into the skin See admin instructions. Inject 0-15 units 4 times daily per sliding scale: < 69 : implement hypoglycemic protocol 70-150 : 0 units 151-200 : 3 units 201-250 : 6 units 251-300 : 9 units 301-350 : 12 units 351-400 : 16 units > 401 : call MD   Yes [provider]  ipratropium-albuterol (DUONEB) 0.5-2.5 (3) MG/3ML SOLN Take 3 mLs by nebulization every 4 (four) hours as needed. 02/18/23  Yes Ghimire, Werner Lean, MD  l-methylfolate-B6-B12 (METANX) 3-35-2 MG TABS tablet Place 1 tablet into feeding tube daily. 08/23/22  Yes Setzer, Lynnell Jude, PA-C  levETIRAcetam (KEPPRA) 100 MG/ML solution Place 5 mLs (500 mg total) into feeding tube 2 (two) times daily. 01/22/23  Yes Micki Riley, MD  lidocaine 4 % Place 1 patch onto the skin daily.   Yes [provider]  metFORMIN (GLUCOPHAGE) 500 MG tablet Place 1 tablet (500 mg total) into feeding tube 2 (two) times daily with a meal. Patient taking differently: Place 500 mg into feeding tube 2 (two) times daily. 08/22/22  Yes Setzer, Lynnell Jude, PA-C  Multiple Vitamins-Minerals (PRESERVISION AREDS 2) CHEW Give 1 each by tube 2 (two) times daily.   Yes [provider]  ondansetron (ZOFRAN) 4 MG tablet Take 4 mg by mouth every 8 (eight) hours as needed for nausea or vomiting.   Yes [provider]  pantoprazole (PROTONIX) 2 mg/mL suspension Place 40 mg into feeding tube daily.   Yes [provider]  Nutritional Supplements (FEEDING SUPPLEMENT, GLUCERNA 1.5 CAL,) LIQD Place 1,000 mLs into feeding tube daily. Patient taking differently: Place 40 mL/hr into feeding tube daily. 02/18/23   Ghimire, Werner Lean, MD  Water For Irrigation, Sterile (FREE WATER) SOLN Place 120 mLs into feeding tube every 4 (four) hours. 03/04/23   Pokhrel, Rebekah Chesterfield, MD   Allergies  Allergen Reactions   Cozaar [Losartan] Swelling    Angioedema    Periogard [Chlorhexidine Gluconate] Swelling and Other (See Comments)    Skin irritation Eye/tongue/lip swelling   Pork Allergy Other (See Comments)    Unknown reaction   Vasotec [Enalapril] Cough   Review of Systems  Physical Exam  Vital Signs: BP 126/63 (BP Location: Left Arm)   Pulse 88   Temp 97.7 F (36.5 C) (Oral)   Resp 18   Ht 5\' 3"  (1.6 m)   Wt 53.5 kg   SpO2 100%   BMI 20.89 kg/m  Pain Scale: CPOT       SpO2: SpO2: 100 % O2 Device:SpO2: 100 % O2 Flow Rate: .O2 Flow Rate (L/min): 5 L/min  IO: Intake/output summary:  Intake/Output Summary (Last 24 hours) at 03/10/2023 1231 Last data filed at 03/10/2023  0600 Gross per 24 hour  Intake 1970.67 ml  Output 690 ml  Net 1280.67 ml    LBM:   Baseline Weight: Weight: 53.4 kg Most recent weight: Weight: 53.5 kg     Palliative Assessment/Data:     Time In: *** Time Out: *** Time Total: *** Greater than 50%  of this time was spent counseling and coordinating care related to the above assessment and plan.  Signed by: Lorinda Creed, NP   Please contact Palliative Medicine Team phone at [phone] for questions and concerns.  For individual provider: See Loretha Stapler

## 2023-03-10 NOTE — Progress Notes (Signed)
PHARMACY - PHYSICIAN COMMUNICATION CRITICAL VALUE ALERT - BLOOD CULTURE IDENTIFICATION (BCID)  Gina Kelley is an 87 y.o. female who presented to [facility] on 03/09/2023  Name of physician (or Provider) Contacted: Dr. Joneen Roach  Current antibiotics: Vancomycin, Cefepime  Changes to prescribed antibiotics recommended:  No changes for now  Results for orders placed or performed during the hospital encounter of 03/09/23  Blood Culture ID Panel (Reflexed) (Collected: 03/09/2023  8:40 AM)  Result Value Ref Range   Enterococcus faecalis NOT DETECTED NOT DETECTED   Enterococcus Faecium NOT DETECTED NOT DETECTED   Listeria monocytogenes NOT DETECTED NOT DETECTED   Staphylococcus species DETECTED (A) NOT DETECTED   Staphylococcus aureus (BCID) NOT DETECTED NOT DETECTED   Staphylococcus epidermidis DETECTED (A) NOT DETECTED   Staphylococcus lugdunensis NOT DETECTED NOT DETECTED   Streptococcus species NOT DETECTED NOT DETECTED   Streptococcus agalactiae NOT DETECTED NOT DETECTED   Streptococcus pneumoniae NOT DETECTED NOT DETECTED   Streptococcus pyogenes NOT DETECTED NOT DETECTED   A.calcoaceticus-baumannii NOT DETECTED NOT DETECTED   Bacteroides fragilis NOT DETECTED NOT DETECTED   Enterobacterales NOT DETECTED NOT DETECTED   Enterobacter cloacae complex NOT DETECTED NOT DETECTED   Escherichia coli NOT DETECTED NOT DETECTED   Klebsiella aerogenes NOT DETECTED NOT DETECTED   Klebsiella oxytoca NOT DETECTED NOT DETECTED   Klebsiella pneumoniae NOT DETECTED NOT DETECTED   Proteus species NOT DETECTED NOT DETECTED   Salmonella species NOT DETECTED NOT DETECTED   Serratia marcescens NOT DETECTED NOT DETECTED   Haemophilus influenzae NOT DETECTED NOT DETECTED   Neisseria meningitidis NOT DETECTED NOT DETECTED   Pseudomonas aeruginosa NOT DETECTED NOT DETECTED   Stenotrophomonas maltophilia NOT DETECTED NOT DETECTED   Candida albicans NOT DETECTED NOT DETECTED   Candida auris NOT DETECTED  NOT DETECTED   Candida glabrata NOT DETECTED NOT DETECTED   Candida krusei NOT DETECTED NOT DETECTED   Candida parapsilosis NOT DETECTED NOT DETECTED   Candida tropicalis NOT DETECTED NOT DETECTED   Cryptococcus neoformans/gattii NOT DETECTED NOT DETECTED   Methicillin resistance mecA/C DETECTED (A) NOT DETECTED    [name] 03/10/2023  6:36 AM

## 2023-03-10 NOTE — TOC Initial Note (Signed)
Transition of Care [facility]) - Initial/Assessment Note    Patient Details  Name: Gina Kelley MRN: [id number] Date of Birth: 1935/10/14  Transition of [facility]) CM/SW Contact:    Eduard Roux, LCSW Phone Number: 03/10/2023, 3:01 PM  Clinical Narrative:                  CSW  spoke with patient's daughter, [name]. CSW introduced self and explained role. Family confirmed patient  is from [name]  and will return to [name] once medically stable.   TOC will continue to follow and assist with discharge planning.  Antony Blackbird, MSW, LCSW Clinical Social Worker    Expected Discharge Plan: Skilled Nursing Facility Barriers to Discharge: Continued Medical Work up   Patient Goals and CMS Choice            Expected Discharge Plan and Services       Living arrangements for the past 2 months: Skilled Nursing Facility                                      Prior Living Arrangements/Services Living arrangements for the past 2 months: Skilled Nursing Facility   Patient language and need for interpreter reviewed:: No        Need for Family Participation in Patient Care: Yes (Comment) Care giver support system in place?: Yes (comment)   Criminal Activity/Legal Involvement Pertinent to Current Situation/Hospitalization: No - Comment as needed  Activities of Daily Living Home Assistive Devices/Equipment: None ADL Screening (condition at time of admission) Patient's cognitive ability adequate to safely complete daily activities?: No Is the patient deaf or have difficulty hearing?: No Does the patient have difficulty seeing, even when wearing glasses/contacts?: Yes Does the patient have difficulty concentrating, remembering, or making decisions?: Yes Patient able to express need for assistance with ADLs?: No Does the patient have difficulty dressing or bathing?: Yes Independently performs ADLs?: No Communication: Needs assistance Is this a change from  baseline?: Pre-admission baseline Dressing (OT): Dependent Is this a change from baseline?: Pre-admission baseline Grooming: Dependent Is this a change from baseline?: Pre-admission baseline Feeding: Dependent Is this a change from baseline?: Pre-admission baseline Bathing: Dependent Is this a change from baseline?: Pre-admission baseline Toileting: Dependent Is this a change from baseline?: Pre-admission baseline In/Out Bed: Dependent Is this a change from baseline?: Pre-admission baseline Walks in Home: Dependent Is this a change from baseline?: Pre-admission baseline Does the patient have difficulty walking or climbing stairs?: Yes Weakness of Legs: Both Weakness of Arms/Hands: Both  Permission Sought/Granted Permission sought to share information with : Other (comment) (patient only alert to self)       Permission granted to share info w AGENCY: [name]  Permission granted to share info w Relationship: Eulis Foster / daugter  Permission granted to share info w Contact Information: [phone]  Emotional Assessment       Orientation: : Oriented to Self Alcohol / Substance Use: Not Applicable Psych Involvement: No (comment)  Admission diagnosis:  Sepsis (HCC) [A41.9] Pneumonia due to infectious organism, unspecified laterality, unspecified part of lung [J18.9] Sepsis, due to unspecified organism, unspecified whether acute organ dysfunction present [facility]) [A41.9] Patient Active Problem List   Diagnosis Date Noted   Sepsis due to pneumonia (HCC) 03/09/2023   Chronic respiratory failure with hypoxia (HCC) 03/09/2023   Elevated troponin 03/09/2023   Acute on chronic anemia 03/02/2023  Dysphagia 03/02/2023   Acute metabolic encephalopathy 03/01/2023   Transaminitis 03/01/2023   History of intracranial hemorrhage 03/01/2023   Seizure disorder (HCC) 03/01/2023   Multifocal pneumonia 02/13/2023   Sepsis with acute renal failure without septic shock (HCC) 02/13/2023    Mild malnutrition (HCC) 08/07/2022   Subdural hematoma (HCC) 08/02/2022   Pressure injury of skin 07/22/2022   Ventilator dependence (HCC) 07/11/2022   Malnutrition of moderate degree 07/10/2022   S/P craniotomy 07/08/2022   ICH (intracerebral hemorrhage) (HCC) 07/08/2022   Need for prophylactic vaccination against Streptococcus pneumoniae (pneumococcus) 11/15/2021   Prolapse of female pelvic organs 05/15/2020   Osteopenia of right femoral neck 10/20/2016   Hypertensive retinopathy of both eyes, grade 1 05/15/2016   Gastroesophageal reflux disease without esophagitis 02/18/2013   Healthcare maintenance 12/04/2011   Normocytic anemia 07/08/2006   Type 2 diabetes mellitus (HCC) 04/14/2006   Hyperlipidemia 04/14/2006   Essential hypertension 04/14/2006   PCP:  Patient, No Pcp Per Pharmacy:   [facility] - [place], [place] - [address] [place] [address] Phone: [phone] Fax: [phone]     Social Determinants of Health (SDOH) Social History: SDOH Screenings   Food Insecurity: No Food Insecurity (03/02/2023)  Housing: Patient Unable To Answer (03/02/2023)  Transportation Needs: No Transportation Needs (03/02/2023)  Utilities: Not At Risk (03/02/2023)  Depression (PHQ2-9): Low Risk  (11/15/2021)  Tobacco Use: Medium Risk (03/09/2023)   SDOH Interventions:     Readmission Risk Interventions     No data to display

## 2023-03-10 NOTE — Progress Notes (Signed)
Initial Nutrition Assessment  DOCUMENTATION CODES:   Non-severe (moderate) malnutrition in context of chronic illness  INTERVENTION:  Trickle feed Glucerna 1.5 @20  ml/hr when clinically appropriate (goal 40 ml/hr)  -MD wishes to hold off on tube feeds at this time due to patient's decreased alertness and reports of aspiration last night    NUTRITION DIAGNOSIS:   Moderate Malnutrition related to chronic illness as evidenced by moderate fat depletion, severe muscle depletion.  GOAL:   Patient will meet greater than or equal to 90% of their needs  MONITOR:   Weight trends, TF tolerance, Skin, I & O's, Labs, Diet advancement  REASON FOR ASSESSMENT:   Consult Enteral/tube feeding initiation and management  ASSESSMENT:   87 y.o. female with PMHx including HTN, T2DM, large spontaneous subdural hematoma and intracerebral hemorrhage in January 2024 requiring craniotomy, PEG, s/p chronic indwelling foley catheter and pain, chronic respiratory failure, GERD, PUD, bed bound who presents due to drops in her O2 saturation. Patient admitted for sepsis due to pneumonia  Patient familiar to nutrition services. Several recent admissions.   No family at bedside to provide hx. RD reviewed chart for nutrition hx. Patient has been aspirating and her tube feeds are on hold per MD wishes. Patient was asleep and did not wake up while RD completed NFPE.   Baseline Nutrition: Pureed diet, Glucerna 1.5 @40  ml/hr from 2100-0900, 120 ml q4 hrs FWF    Labs: CBG 124, BUN 28, lipase 92, AST 209, ALT 212 Meds: Rocephin, insulin, protonix, NS, Vancocin, Keppra   Wt:  03/10/23 53.5 kg  03/02/23 53.1 kg  02/18/23 56.9 kg  01/06/23 54 kg  08/22/22 77 kg  08/02/22 97.3 kg  11/15/21 56.2 kg  07/12/21 55.7 kg   I/O's: +1.3 L (net cumulative)   NUTRITION - FOCUSED PHYSICAL EXAM:  Flowsheet Row Most Recent Value  Orbital Region Moderate depletion  Upper Arm Region Unable to assess  Thoracic and  Lumbar Region Moderate depletion  Buccal Region Moderate depletion  Temple Region Moderate depletion  Clavicle Bone Region Severe depletion  Clavicle and Acromion Bone Region Severe depletion  Scapular Bone Region Unable to assess  Dorsal Hand Unable to assess  Patellar Region Severe depletion  Anterior Thigh Region Severe depletion  Posterior Calf Region Unable to assess  Edema (RD Assessment) None  Hair Reviewed  Eyes Unable to assess  Mouth Unable to assess  Skin Reviewed  Nails Unable to assess       Diet Order:   Diet Order             Diet NPO time specified Except for: Other (See Comments)  Diet effective now                   EDUCATION NEEDS:   Not appropriate for education at this time  Skin:  Skin Assessment: Reviewed RN Assessment  Last BM:  PTA  Height:   Ht Readings from Last 1 Encounters:  03/09/23 5\' 3"  (1.6 m)    Weight:   Wt Readings from Last 1 Encounters:  03/10/23 53.5 kg    Ideal Body Weight:     BMI:  Body mass index is 20.89 kg/m.  Estimated Nutritional Needs:   Kcal:  1400-1600  Protein:  65-80 g  Fluid:  >/= 1.6 L  Leodis Rains, RDN, LDN  Clinical Nutrition

## 2023-03-10 NOTE — Progress Notes (Signed)
   03/10/23 0300  Assess: MEWS Score  Temp 100.3 F (37.9 C)  BP 130/70  MAP (mmHg) 86  Pulse Rate 96  ECG Heart Rate 96  Resp (!) 28  SpO2 96 %  O2 Device Nasal Cannula  Patient Activity (if Appropriate) In bed  O2 Flow Rate (L/min) 5 L/min  Assess: MEWS Score  MEWS Temp 0  MEWS Systolic 0  MEWS Pulse 0  MEWS RR 2  MEWS LOC 1  MEWS Score 3  MEWS Score Color Yellow  Assess: if the MEWS score is Yellow or Red  Were vital signs accurate and taken at a resting state? Yes  Does the patient meet 2 or more of the SIRS criteria? Yes  Does the patient have a confirmed or suspected source of infection? Yes  MEWS guidelines implemented  Yes, yellow  Treat  MEWS Interventions Considered administering scheduled or prn medications/treatments as ordered  Take Vital Signs  Increase Vital Sign Frequency  Yellow: Q2hr x1, continue Q4hrs until patient remains green for 12hrs  Escalate  MEWS: Escalate Yellow: Discuss with charge nurse and consider notifying provider and/or RRT  Notify: Charge Nurse/RN  Name of Charge Nurse/RN Notified Jasmine December, RN  Provider Notification  Provider Name/Title Dr. Joneen Roach  Date Provider Notified 03/10/23  Time Provider Notified 0304  Method of Notification Page (secure chat)  Notification Reason Change in status  Provider response See new orders  Date of Provider Response 03/10/23  Time of Provider Response 0304  Assess: SIRS CRITERIA  SIRS Temperature  0  SIRS Pulse 1  SIRS Respirations  1  SIRS WBC 0  SIRS Score Sum  2

## 2023-03-11 DIAGNOSIS — J189 Pneumonia, unspecified organism: Secondary | ICD-10-CM | POA: Diagnosis not present

## 2023-03-11 DIAGNOSIS — Z8679 Personal history of other diseases of the circulatory system: Secondary | ICD-10-CM | POA: Diagnosis not present

## 2023-03-11 DIAGNOSIS — A419 Sepsis, unspecified organism: Secondary | ICD-10-CM | POA: Diagnosis not present

## 2023-03-11 DIAGNOSIS — G9341 Metabolic encephalopathy: Secondary | ICD-10-CM | POA: Diagnosis not present

## 2023-03-11 LAB — GLUCOSE, CAPILLARY
Glucose-Capillary: 164 mg/dL — ABNORMAL HIGH (ref 70–99)
Glucose-Capillary: 123 mg/dL — ABNORMAL HIGH (ref 70–99)
Glucose-Capillary: 127 mg/dL — ABNORMAL HIGH (ref 70–99)
Glucose-Capillary: 98 mg/dL (ref 70–99)
Glucose-Capillary: 115 mg/dL — ABNORMAL HIGH (ref 70–99)

## 2023-03-11 LAB — BASIC METABOLIC PANEL
Anion gap: 11 (ref 5–15)
BUN: 16 mg/dL (ref 8–23)
CO2: 25 mmol/L (ref 22–32)
Calcium: 8.9 mg/dL (ref 8.9–10.3)
Chloride: 104 mmol/L (ref 98–111)
Creatinine, Ser: 0.64 mg/dL (ref 0.44–1.00)
GFR, Estimated: >60 mL/min (ref >60)
Glucose, Bld: 130 mg/dL — ABNORMAL HIGH (ref 70–99)
Potassium: 3.3 mmol/L — ABNORMAL LOW (ref 3.5–5.1)
Sodium: 140 mmol/L (ref 135–145)

## 2023-03-11 LAB — CBC
HCT: 24.1 % — ABNORMAL LOW (ref 36.0–46.0)
Hemoglobin: 7.6 g/dL — ABNORMAL LOW (ref 12.0–15.0)
MCH: 31.1 pg (ref 26.0–34.0)
MCHC: 31.5 g/dL (ref 30.0–36.0)
MCV: 98.8 fL (ref 80.0–100.0)
Platelets: 346 10*3/uL (ref 150–400)
RBC: 2.44 MIL/uL — ABNORMAL LOW (ref 3.87–5.11)
RDW: 17.2 % — ABNORMAL HIGH (ref 11.5–15.5)
WBC: 30.6 10*3/uL — ABNORMAL HIGH (ref 4.0–10.5)
nRBC: 0 % (ref 0.0–0.2)

## 2023-03-11 MED ORDER — POTASSIUM CHLORIDE 20 MEQ PO PACK
40.0000 meq | PACK | Freq: Once | ORAL | Status: AC
  Administered 2023-03-11: 40 meq
  Filled 2023-03-11: qty 2

## 2023-03-11 NOTE — Progress Notes (Signed)
PROGRESS NOTE  FEMALE MASIS QMV:[id number] DOB: 06/17/1936 DOA: 03/09/2023 PCP: Patient, No Pcp Per   LOS: 2 days   Brief Narrative / Interim history: 87 year old female with HTN, DM2, large spontaneous subdural hematoma and intracerebral hemorrhage January 2024 requiring craniotomy, history of left-sided hemiparesis dysphagia with PEG tube, chronic Foley catheter, chronic hypoxia on 5 L bedbound status, living in a facility comes into the hospital with hypoxia.  She was recently hospitalized 9/1-9/4 with encephalopathy due to UTI.  Subjective / 24h Interval events: Wakes up when called this morning, has visible facial droop and speech is intelligible  Assesement and Plan: Principal Problem:   Sepsis due to pneumonia [facility]) Active Problems:   Chronic respiratory failure with hypoxia (HCC)   Elevated troponin   S/P craniotomy   History of intracranial hemorrhage   Dysphagia   Acute metabolic encephalopathy   Normocytic anemia   Essential hypertension   Type 2 diabetes mellitus (HCC)   Transaminitis   Seizure disorder (HCC)   Hyperlipidemia   Principal problem Sepsis due to aspiration pneumonia, acute on chronic hypoxic respiratory failure -likely due to recurrent aspiration.  For now tube feeds are on hold. -Continue antibiotics -Palliative consulted to discuss goals of care given chronic medical problems, recurrent aspiration, recurrent hospitalizations, bedbound status  Active problems 1/4 bottles staph epi -likely contaminant  Acute metabolic encephalopathy-due to #1.  More alert today  Troponin elevation-likely in the setting of sepsis, demand ischemia  History of ICH/SDH s/p craniotomy, chronic dysphagia -she is more alert today, slowly resume tube feeds  Normocytic anemia- She has been evaluated by GI in the past and was  thought not to be a candidate for endoscopy.  Follow trend   Essential hypertension -Continue Coreg  Hypokalemia-replace  potassium  Leukocytosis-persistent, due to recurrent aspiration  Diabetes mellitus type 2, with long-term use of insulin -Hold metformin. SSI  Lab Results  Component Value Date   HGBA1C 5.8 (H) 02/14/2023   CBG (last 3)  Recent Labs    03/10/23 2014 03/11/23 0206 03/11/23 0802  GLUCAP 129* 164* 123*   Transaminitis - Acute on chronic.  AST 209 and ALT 212 acutely elevated from prior with lipase elevated as well.  CT did not note any acute intra-abdominal process.  Improving   History of bladder outlet obstruction -Monitor postvoid residuals    Seizure disorder -Continue Keppra IV   Hyperlipidemia -Held atorvastatin due to acute liver dysfunction   Depression -Continue Prozac   GERD -Continue Protonix  Scheduled Meds:  carvedilol  6.25 mg Per Tube BID WC   ferrous sulfate  300 mg Per Tube Daily   FLUoxetine  20 mg Per Tube QHS   gabapentin  100 mg Per Tube Q8H   guaiFENesin  10 mL Oral BID   insulin aspart  0-15 Units Subcutaneous Q6H   lidocaine  1 patch Transdermal Q24H   pantoprazole (PROTONIX) IV  40 mg Intravenous Q24H   sodium chloride flush  3 mL Intravenous Q12H   Continuous Infusions:  sodium chloride Stopped (03/10/23 1116)   cefTRIAXone (ROCEPHIN)  IV 2 g (03/10/23 1802)   levETIRAcetam 500 mg (03/11/23 0951)   PRN Meds:.sodium chloride, acetaminophen, albuterol, influenza vaccine adjuvanted, mouth rinse  Current Outpatient Medications  Medication Instructions   acetaminophen (TYLENOL) 500 mg, Per Tube, 4 times daily PRN   acetaminophen (TYLENOL) 1,000 mg, Per Tube, 2 times daily   amLODipine (NORVASC) 10 mg, Per Tube, Daily   ascorbic acid (VITAMIN C) 500 mg, Per  Tube, Daily   atorvastatin (LIPITOR) 80 mg, Per Tube, Daily   carvedilol (COREG) 6.25 mg, Per Tube, 2 times daily with meals   ezetimibe (ZETIA) 10 mg, Per Tube, Daily   famotidine (PEPCID) 20 mg, Per Tube, 2 times daily   ferrous sulfate 408 mg, Per Tube, Daily   FLUoxetine (PROZAC) 20  mg, Per Tube, Daily at bedtime   gabapentin (NEURONTIN) 100 mg, Per Tube, Every 8 hours   guaiFENesin (ROBITUSSIN) 200 mg, Per Tube, 4 times daily   insulin lispro (HUMALOG) 0-16 Units, Subcutaneous, See admin instructions, Inject 0-15 units 4 times daily per sliding scale:<BR>< 69 : implement hypoglycemic protocol<BR>70-150 : 0 units<BR>151-200 : 3 units<BR>201-250 : 6 units<BR>251-300 : 9 units<BR>301-350 : 12 units<BR>351-400 : 16 units<BR>> 401 : call MD   ipratropium-albuterol (DUONEB) 0.5-2.5 (3) MG/3ML SOLN 3 mLs, Nebulization, Every 4 hours PRN   l-methylfolate-B6-B12 (METANX) 3-35-2 MG TABS tablet 1 tablet, Per Tube, Daily   levETIRAcetam (KEPPRA) 500 mg, Per Tube, 2 times daily   lidocaine 4 % 1 patch, Transdermal, Every 24 hours   metFORMIN (GLUCOPHAGE) 500 mg, Per Tube, 2 times daily with meals   Multiple Vitamins-Minerals (PRESERVISION AREDS 2) CHEW 1 each, Tube, 2 times daily   Nutritional Supplements (FEEDING SUPPLEMENT, GLUCERNA 1.5 CAL,) LIQD 1,000 mLs, Per Tube, Every 24 hours   ondansetron (ZOFRAN) 4 mg, Oral, Every 8 hours PRN   pantoprazole (PROTONIX) 40 mg, Per Tube, Daily   Water For Irrigation, Sterile (FREE WATER) SOLN 120 mLs, Per Tube, Every 4 hours    Diet Orders (From admission, onward)     Start     Ordered   03/09/23 1406  Diet NPO time specified Except for: Other (See Comments)  Diet effective now       Comments: Meds thur tube  Question:  Except for  Answer:  Other (See Comments)   03/09/23 1406            DVT prophylaxis: SCDs Start: 03/09/23 1317   Lab Results  Component Value Date   PLT 346 03/11/2023      Code Status: Full Code  Family Communication: no family at bedside   Status is: Inpatient Remains inpatient appropriate because: severity of illness  Level of care: Progressive  Consultants:  Palliative care  Objective: Vitals:   03/11/23 0700 03/11/23 0727 03/11/23 0728 03/11/23 0752  BP:  (!) 146/81    Pulse: 96   (!) 110   Resp: (!) 23 (!) 21  (!) 23  Temp:  98.5 F (36.9 C) 98.5 F (36.9 C)   TempSrc:   Oral   SpO2:   97% 95%  Weight:      Height:        Intake/Output Summary (Last 24 hours) at 03/11/2023 0953 Last data filed at 03/11/2023 0600 Gross per 24 hour  Intake 200.12 ml  Output 825 ml  Net -624.88 ml   Wt Readings from Last 3 Encounters:  03/11/23 54.1 kg  03/02/23 53.1 kg  02/18/23 56.9 kg    Examination:  Constitutional: NAD Eyes: no scleral icterus ENMT: Mucous membranes are moist.  Neck: normal, supple Respiratory: Coarse breath sounds bilaterally, gurgling noted Cardiovascular: Regular rate and rhythm, no murmurs / rubs / gallops. No LE edema.  Abdomen: non distended, no tenderness. Bowel sounds positive.   Data Reviewed: I have independently reviewed following labs and imaging studies   CBC Recent Labs  Lab 03/09/23 0748 03/09/23 1012 03/10/23 0854 03/11/23 0431  WBC 19.6*  --  29.3* 30.6*  HGB 9.5* 10.2* 8.5* 7.6*  HCT 30.7* 30.0* 25.8* 24.1*  PLT 461*  --  326 346  MCV 98.4  --  98.1 98.8  MCH 30.4  --  32.3 31.1  MCHC 30.9  --  32.9 31.5  RDW 16.6*  --  17.2* 17.2*  LYMPHSABS 0.4*  --   --   --   MONOABS 1.0  --   --   --   EOSABS 0.0  --   --   --   BASOSABS 0.0  --   --   --     Recent Labs  Lab 03/09/23 0748 03/09/23 0849 03/09/23 1008 03/09/23 1012 03/09/23 1103 03/09/23 1606 03/10/23 0854 03/10/23 0958 03/10/23 1104 03/11/23 0431  NA 133*  --   --  138  --   --   --  139  --  140  K 4.0  --   --  4.0  --   --   --  3.8  --  3.3*  CL 97*  --   --   --   --   --   --  104  --  104  CO2 25  --   --   --   --   --   --  28  --  25  GLUCOSE 268*  --   --   --   --   --   --  142*  --  130*  BUN 28*  --   --   --   --   --   --  25*  --  16  CREATININE 0.94  --   --   --   --   --   --  0.74  --  0.64  CALCIUM 8.9  --   --   --   --   --   --  9.0  --  8.9  AST 209*  --   --   --   --   --   --  112*  --   --   ALT 212*  --   --   --   --    --   --  137*  --   --   ALKPHOS 123  --   --   --   --   --   --  83  --   --   BILITOT 0.4  --   --   --   --   --   --  0.4  --   --   ALBUMIN 2.7*  --   --   --   --   --   --  2.4*  --   --   LATICACIDVEN  --    < > 4.0*  --  3.6* 3.0* 1.9  --  2.1*  --   BNP 235.4*  --   --   --   --   --   --   --   --   --    < > = values in this interval not displayed.    ------------------------------------------------------------------------------------------------------------------ No results for input(s): "CHOL", "HDL", "LDLCALC", "TRIG", "CHOLHDL", "LDLDIRECT" in the last 72 hours.  Lab Results  Component Value Date   HGBA1C 5.8 (H) 02/14/2023   ------------------------------------------------------------------------------------------------------------------ No results for input(s): "TSH", "T4TOTAL", "T3FREE", "THYROIDAB" in the last 72 hours.  Invalid input(s): "FREET3"  Cardiac Enzymes No results for input(s): "CKMB", "TROPONINI", "MYOGLOBIN"  in the last 168 hours.  Invalid input(s): "CK" ------------------------------------------------------------------------------------------------------------------    Component Value Date/Time   BNP 235.4 (H) 03/09/2023 0748    CBG: Recent Labs  Lab 03/10/23 0757 03/10/23 1540 03/10/23 2014 03/11/23 0206 03/11/23 0802  GLUCAP 124* 122* 129* 164* 123*    Recent Results (from the past 240 hour(s))  Culture, blood (routine x 2)     Status: None   Collection Time: 03/01/23  6:00 PM   Specimen: BLOOD  Result Value Ref Range Status   Specimen Description BLOOD SITE NOT SPECIFIED  Final   Special Requests   Final    BOTTLES DRAWN AEROBIC AND ANAEROBIC Blood Culture results may not be optimal due to an excessive volume of blood received in culture bottles   Culture   Final    NO GROWTH 5 DAYS Performed at [facility] Lab, [address], [place], [place] [address]    Report Status 03/06/2023 FINAL  Final  SARS Coronavirus 2 by RT  PCR (hospital order, performed in [facility] hospital lab) *cepheid single result test* Peripheral     Status: None   Collection Time: 03/01/23  6:04 PM   Specimen: Peripheral; Nasal Swab  Result Value Ref Range Status   SARS Coronavirus 2 by RT PCR NEGATIVE NEGATIVE Final    Comment: Performed at [facility] Lab, [address], [place], [place] [address]  Culture, blood (routine x 2)     Status: None   Collection Time: 03/01/23  6:42 PM   Specimen: BLOOD  Result Value Ref Range Status   Specimen Description BLOOD BLOOD RIGHT HAND  Final   Special Requests   Final    BOTTLES DRAWN AEROBIC AND ANAEROBIC Blood Culture adequate volume   Culture   Final    NO GROWTH 5 DAYS Performed at [facility] Lab, [address] [place], [place] [address]    Report Status 03/06/2023 FINAL  Final  Urine Culture     Status: Abnormal (Preliminary result)   Collection Time: 03/02/23  7:38 AM   Specimen: Urine, Clean Catch  Result Value Ref Range Status   Specimen Description URINE, CLEAN CATCH  Final   Special Requests NONE  Final   Culture (A)  Final    >=100,000 COLONIES/mL CITROBACTER YOUNGAE >=100,000 COLONIES/mL ENTEROCOCCUS FAECALIS Sent to [facility] for further susceptibility testing. Performed at [facility], [address], [place], [place] [address]    Report Status PENDING  Incomplete   Organism ID, Bacteria CITROBACTER YOUNGAE (A)  Final      Susceptibility   Citrobacter youngae - MIC*    CEFEPIME 0.5 SENSITIVE Sensitive     CEFTRIAXONE >=64 RESISTANT Resistant     CIPROFLOXACIN <=0.25 SENSITIVE Sensitive     GENTAMICIN <=1 SENSITIVE Sensitive     IMIPENEM <=0.25 SENSITIVE Sensitive     NITROFURANTOIN 32 SENSITIVE Sensitive     TRIMETH/SULFA <=20 SENSITIVE Sensitive     PIP/TAZO 64 INTERMEDIATE Intermediate     * >=100,000 COLONIES/mL CITROBACTER YOUNGAE  Susceptibility, Aer + Anaerob     Status: Abnormal   Collection Time: 03/02/23  7:38 AM  Result Value Ref Range  Status   Suscept, Aer + Anaerob Preliminary report (A)  Final    Comment: (NOTE) Performed At: [facility] [address] [place], [place] [id number] Jolene Schimke MD [id number]    Source of Sample ENTEROCOCCUS FAECALIS/ URINE  Final    Comment: Performed at [facility] Lab, [address] [place], [place] [address]  Susceptibility Result     Status: Abnormal   Collection Time: 03/02/23  7:38 AM  Result Value Ref Range Status   Suscept Result 1 Enterococcus faecalis (A)  Final    Comment: (NOTE) Identification performed by account, not confirmed by this laboratory. Performed At: [facility] [address] [place], [place] [id number] Jolene Schimke MD [id number]   Resp panel by RT-PCR (RSV, Flu A&B, Covid) Anterior Nasal Swab     Status: None   Collection Time: 03/09/23  7:23 AM   Specimen: Anterior Nasal Swab  Result Value Ref Range Status   SARS Coronavirus 2 by RT PCR NEGATIVE NEGATIVE Final   Influenza A by PCR NEGATIVE NEGATIVE Final   Influenza B by PCR NEGATIVE NEGATIVE Final    Comment: (NOTE) The Xpert Xpress SARS-CoV-2/FLU/RSV plus assay is intended as an aid in the diagnosis of influenza from Nasopharyngeal swab specimens and should not be used as a sole basis for treatment. Nasal washings and aspirates are unacceptable for Xpert Xpress SARS-CoV-2/FLU/RSV testing.  Fact Sheet for Patients: BloggerCourse.com  Fact Sheet for Healthcare Providers: SeriousBroker.it  This test is not yet approved or cleared by the [place] FDA and has been authorized for detection and/or diagnosis of SARS-CoV-2 by FDA under an Emergency Use Authorization (EUA). This EUA will remain in effect (meaning this test can be used) for the duration of the COVID-19 declaration under Section 564(b)(1) of the Act, 21 U.S.C. section 360bbb-3(b)(1), unless the authorization is terminated or revoked.     Resp  Syncytial Virus by PCR NEGATIVE NEGATIVE Final    Comment: (NOTE) Fact Sheet for Patients: BloggerCourse.com  Fact Sheet for Healthcare Providers: SeriousBroker.it  This test is not yet approved or cleared by the [place] FDA and has been authorized for detection and/or diagnosis of SARS-CoV-2 by FDA under an Emergency Use Authorization (EUA). This EUA will remain in effect (meaning this test can be used) for the duration of the COVID-19 declaration under Section 564(b)(1) of the Act, 21 U.S.C. section 360bbb-3(b)(1), unless the authorization is terminated or revoked.  Performed at [facility] Lab, [address], [place], [place] [address]   Blood culture (routine x 2)     Status: None (Preliminary result)   Collection Time: 03/09/23  8:40 AM   Specimen: BLOOD LEFT ARM  Result Value Ref Range Status   Specimen Description BLOOD LEFT ARM  Final   Special Requests   Final    BOTTLES DRAWN AEROBIC AND ANAEROBIC Blood Culture adequate volume   Culture  Setup Time   Final    GRAM POSITIVE COCCI IN CLUSTERS ANAEROBIC BOTTLE ONLY CRITICAL RESULT CALLED TO, READ BACK BY AND VERIFIED WITH: PHARMD J. LEDFORD 03/10/23 @ 0606 BY AB Performed at [facility] Lab, [address] [place], [place] [address]    Culture GRAM POSITIVE COCCI  Final   Report Status PENDING  Incomplete  Blood Culture ID Panel (Reflexed)     Status: Abnormal   Collection Time: 03/09/23  8:40 AM  Result Value Ref Range Status   Enterococcus faecalis NOT DETECTED NOT DETECTED Final   Enterococcus Faecium NOT DETECTED NOT DETECTED Final   Listeria monocytogenes NOT DETECTED NOT DETECTED Final   Staphylococcus species DETECTED (A) NOT DETECTED Final    Comment: CRITICAL RESULT CALLED TO, READ BACK BY AND VERIFIED WITH: PHARMD J. LEDFORD 03/10/23 @ 0606 BY AB    Staphylococcus aureus (BCID) NOT DETECTED NOT DETECTED  Final   Staphylococcus epidermidis  DETECTED (A) NOT DETECTED Final    Comment: Methicillin (oxacillin) resistant coagulase negative staphylococcus. Possible blood culture contaminant (unless isolated from more than one blood culture draw or clinical case suggests pathogenicity). No antibiotic treatment is indicated for blood  culture contaminants. CRITICAL RESULT CALLED TO, READ BACK BY AND VERIFIED WITH: PHARMD J. LEDFORD 03/10/23 @ 0606 BY AB    Staphylococcus lugdunensis NOT DETECTED NOT DETECTED Final   Streptococcus species NOT DETECTED NOT DETECTED Final   Streptococcus agalactiae NOT DETECTED NOT DETECTED Final   Streptococcus pneumoniae NOT DETECTED NOT DETECTED Final   Streptococcus pyogenes NOT DETECTED NOT DETECTED Final   A.calcoaceticus-baumannii NOT DETECTED NOT DETECTED Final   Bacteroides fragilis NOT DETECTED NOT DETECTED Final   Enterobacterales NOT DETECTED NOT DETECTED Final   Enterobacter cloacae complex NOT DETECTED NOT DETECTED Final   Escherichia coli NOT DETECTED NOT DETECTED Final   Klebsiella aerogenes NOT DETECTED NOT DETECTED Final   Klebsiella oxytoca NOT DETECTED NOT DETECTED Final   Klebsiella pneumoniae NOT DETECTED NOT DETECTED Final   Proteus species NOT DETECTED NOT DETECTED Final   Salmonella species NOT DETECTED NOT DETECTED Final   Serratia marcescens NOT DETECTED NOT DETECTED Final   Haemophilus influenzae NOT DETECTED NOT DETECTED Final   Neisseria meningitidis NOT DETECTED NOT DETECTED Final   Pseudomonas aeruginosa NOT DETECTED NOT DETECTED Final   Stenotrophomonas maltophilia NOT DETECTED NOT DETECTED Final   Candida albicans NOT DETECTED NOT DETECTED Final   Candida auris NOT DETECTED NOT DETECTED Final   Candida glabrata NOT DETECTED NOT DETECTED Final   Candida krusei NOT DETECTED NOT DETECTED Final   Candida parapsilosis NOT DETECTED NOT DETECTED Final   Candida tropicalis NOT DETECTED NOT DETECTED Final   Cryptococcus neoformans/gattii NOT DETECTED NOT DETECTED Final    Methicillin resistance mecA/C DETECTED (A) NOT DETECTED Final    Comment: CRITICAL RESULT CALLED TO, READ BACK BY AND VERIFIED WITH: PHARMD J. LEDFORD 03/10/23 @ 0606 BY AB Performed at [facility] Lab, [address] [place], [place] [address]   Blood culture (routine x 2)     Status: None (Preliminary result)   Collection Time: 03/09/23  8:41 AM   Specimen: BLOOD RIGHT ARM  Result Value Ref Range Status   Specimen Description BLOOD RIGHT ARM  Final   Special Requests   Final    BOTTLES DRAWN AEROBIC AND ANAEROBIC Blood Culture adequate volume   Culture   Final    NO GROWTH 2 DAYS Performed at [facility] Lab, [address], [place], [place] [address]    Report Status PENDING  Incomplete  Culture, Respiratory w Gram Stain     Status: None (Preliminary result)   Collection Time: 03/09/23  3:46 PM   Specimen: Tracheal Aspirate  Result Value Ref Range Status   Specimen Description TRACHEAL ASPIRATE  Final   Special Requests NONE  Final   Gram Stain NO WBC SEEN FEW BUDDING YEAST SEEN   Final   Culture   Final    CULTURE REINCUBATED FOR BETTER GROWTH Performed at [facility] Lab, [address], [place], [place] [address]    Report Status PENDING  Incomplete  MRSA Next Gen by PCR, Nasal     Status: None   Collection Time: 03/09/23  8:29 PM   Specimen: Nasal Mucosa; Nasal Swab  Result Value Ref Range Status   MRSA by PCR Next Gen NOT DETECTED NOT DETECTED Final    Comment: (  NOTE) The GeneXpert MRSA Assay (FDA approved for NASAL specimens only), is one component of a comprehensive MRSA colonization surveillance program. It is not intended to diagnose MRSA infection nor to guide or monitor treatment for MRSA infections. Test performance is not FDA approved in patients less than 56 years old. Performed at [facility] Lab, [address], [place], [place] [address]      Radiology Studies: No results found.   Pamella Pert, MD, PhD [facility]  Between  7 am - 7 pm I am available, please contact me via Amion (for emergencies) or Securechat (non urgent messages)  Between 7 pm - 7 am I am not available, please contact night coverage MD/APP via Amion

## 2023-03-11 NOTE — Plan of Care (Signed)
POC progressing.  

## 2023-03-11 NOTE — Progress Notes (Signed)
Patient ID: [name], female   DOB: 06/26/1936, 87 y.o.   MRN: [id number]    Progress Note from the Palliative Medicine Team at [facility]   Patient Name: Gina Kelley        Date: 03/11/2023 DOB: [birth year]  Age: 87 y.o. MRN#: [id number] Attending Physician: Leatha Gilding, MD Primary Care Physician: Patient, No Pcp Per Admit Date: 03/09/2023   Reason for Consultation/Follow-up    Establishing  goals of care  HPI/ Brief Hospital Review  87 y.o. female   admitted on 03/09/2023 with past   medical history significant of HTN, DM type II, large spontaneous subdural hematoma and intracerebral hemorrhage in 06/2022 requiring craniotomy, status post chronic indwelling Foley catheter and pain, and chronic  respiratory failure on 5 L of nasal cannula oxygen, history of GERD, bed bound resident of [name] presents after having drop in her O2 saturations. Staff noted O2 saturations dropped to 74% on 5 L and called EMS.      She just recently been hospitalized from 9/1-9/4 acute metabolic encephalopathy thought secondary to a UTI.  Prior to that patient had been hospitalized 8/16-8/21 with sepsis secondary to aspiration pneumonia.     Admitted for treatment and stabilization.     CT scan of the head acute stable sequela of right intraparenchymal hemorrhage.  CT scan of the chest abdomen pelvis noted patchy by lateral airspace opacities compatible with multifocal pneumonia      Family face treatment option decisions, advanced directive decisions and anticipatory care needs.     Subjective  Extensive chart review has been completed prior to meeting with patient/family  including labs, vital signs, imaging, progress/consult notes, orders, medications and available advance directive documents.    This NP assessed patient at the bedside as a follow up for palliative medicine needs and emotional support, and to meet with daughter as scheduled for ongoing goals of care  conversation.  Daughter was not at bedside at scheduled time, I reached out to her and was able to speak by telephone.  She is with her father at a physician visit, he was in the emergency room yesterday  She expresses interest in meeting with the palliative medicine team and rescheduled for tomorrow morning at 10 AM        Education offered today regarding  the importance of continued conversation with family and their  medical providers regarding overall plan of care and treatment options,  ensuring decisions are within the context of the patients values and GOCs.  Questions and concerns addressed   Discussed with primary team and nursing staff   Time: 25  minutes      Discussed with attending and treatment team via secure chat  Detailed review of medical records ( labs, imaging, vital signs), medically appropriate exam ( MS, skin, cardia,  resp)   discussed with treatment team, counseling and education to patient, family, staff, documenting clinical information, medication management, coordination of care    Lorinda Creed NP  Palliative Medicine Team Team Phone # [phone] Pager [phone]

## 2023-03-12 DIAGNOSIS — Z7189 Other specified counseling: Secondary | ICD-10-CM | POA: Diagnosis not present

## 2023-03-12 DIAGNOSIS — G9341 Metabolic encephalopathy: Secondary | ICD-10-CM | POA: Diagnosis not present

## 2023-03-12 DIAGNOSIS — R1310 Dysphagia, unspecified: Secondary | ICD-10-CM | POA: Diagnosis not present

## 2023-03-12 DIAGNOSIS — Z9889 Other specified postprocedural states: Secondary | ICD-10-CM | POA: Diagnosis not present

## 2023-03-12 DIAGNOSIS — J189 Pneumonia, unspecified organism: Secondary | ICD-10-CM | POA: Diagnosis not present

## 2023-03-12 DIAGNOSIS — Z515 Encounter for palliative care: Secondary | ICD-10-CM | POA: Diagnosis not present

## 2023-03-12 DIAGNOSIS — Z8679 Personal history of other diseases of the circulatory system: Secondary | ICD-10-CM | POA: Diagnosis not present

## 2023-03-12 DIAGNOSIS — G40909 Epilepsy, unspecified, not intractable, without status epilepticus: Secondary | ICD-10-CM | POA: Diagnosis not present

## 2023-03-12 DIAGNOSIS — A419 Sepsis, unspecified organism: Secondary | ICD-10-CM | POA: Diagnosis not present

## 2023-03-12 LAB — CBC
HCT: 24.1 % — ABNORMAL LOW (ref 36.0–46.0)
Hemoglobin: 7.7 g/dL — ABNORMAL LOW (ref 12.0–15.0)
MCH: 32 pg (ref 26.0–34.0)
MCHC: 32 g/dL (ref 30.0–36.0)
MCV: 100 fL (ref 80.0–100.0)
Platelets: 302 10*3/uL (ref 150–400)
RBC: 2.41 MIL/uL — ABNORMAL LOW (ref 3.87–5.11)
RDW: 17.1 % — ABNORMAL HIGH (ref 11.5–15.5)
WBC: 22.3 10*3/uL — ABNORMAL HIGH (ref 4.0–10.5)
nRBC: 0.1 % (ref 0.0–0.2)

## 2023-03-12 LAB — COMPREHENSIVE METABOLIC PANEL
ALT: 84 U/L — ABNORMAL HIGH (ref 0–44)
AST: 67 U/L — ABNORMAL HIGH (ref 15–41)
Albumin: 2.2 g/dL — ABNORMAL LOW (ref 3.5–5.0)
Alkaline Phosphatase: 79 U/L (ref 38–126)
Anion gap: 11 (ref 5–15)
BUN: 12 mg/dL (ref 8–23)
CO2: 26 mmol/L (ref 22–32)
Calcium: 8.9 mg/dL (ref 8.9–10.3)
Chloride: 105 mmol/L (ref 98–111)
Creatinine, Ser: 0.58 mg/dL (ref 0.44–1.00)
GFR, Estimated: >60 mL/min (ref >60)
Glucose, Bld: 99 mg/dL (ref 70–99)
Potassium: 3.4 mmol/L — ABNORMAL LOW (ref 3.5–5.1)
Sodium: 142 mmol/L (ref 135–145)
Total Bilirubin: 0.5 mg/dL (ref 0.3–1.2)
Total Protein: 6.4 g/dL — ABNORMAL LOW (ref 6.5–8.1)

## 2023-03-12 LAB — CULTURE, RESPIRATORY W GRAM STAIN: Gram Stain: NONE SEEN

## 2023-03-12 LAB — MAGNESIUM: Magnesium: 1.7 mg/dL (ref 1.7–2.4)

## 2023-03-12 LAB — GLUCOSE, CAPILLARY
Glucose-Capillary: 108 mg/dL — ABNORMAL HIGH (ref 70–99)
Glucose-Capillary: 134 mg/dL — ABNORMAL HIGH (ref 70–99)
Glucose-Capillary: 123 mg/dL — ABNORMAL HIGH (ref 70–99)

## 2023-03-12 LAB — SUSCEPTIBILITY RESULT

## 2023-03-12 LAB — CULTURE, BLOOD (ROUTINE X 2): Special Requests: ADEQUATE

## 2023-03-12 LAB — SUSCEPTIBILITY, AER + ANAEROB

## 2023-03-12 MED ORDER — ADULT MULTIVITAMIN W/MINERALS CH
1.0000 | ORAL_TABLET | Freq: Every day | ORAL | Status: DC
  Administered 2023-03-12 – 2023-03-16 (×5): 1
  Filled 2023-03-12 (×5): qty 1

## 2023-03-12 MED ORDER — GLUCERNA 1.5 CAL PO LIQD
1000.0000 mL | ORAL | Status: DC
  Administered 2023-03-12: 1000 mL
  Filled 2023-03-12: qty 1000

## 2023-03-12 NOTE — Progress Notes (Signed)
PROGRESS NOTE  Gina Kelley ZOX:[id number] DOB: [birth year] DOA: 03/09/2023 PCP: Patient, No Pcp Per   LOS: 3 days   Brief Narrative / Interim history: 87 year old female with HTN, DM2, large spontaneous subdural hematoma and intracerebral hemorrhage January 2024 requiring craniotomy, history of left-sided hemiparesis dysphagia with PEG tube, chronic Foley catheter, chronic hypoxia on 5 L bedbound status, living in a facility comes into the hospital with hypoxia.  She was recently hospitalized 9/1-9/4 with encephalopathy due to UTI.  Subjective / 24h Interval events: Wakes up when called, nonverbal but appears comfortable  Assesement and Plan: Principal Problem:   Sepsis due to pneumonia [facility]) Active Problems:   Chronic respiratory failure with hypoxia (HCC)   Elevated troponin   S/P craniotomy   History of intracranial hemorrhage   Dysphagia   Acute metabolic encephalopathy   Normocytic anemia   Essential hypertension   Type 2 diabetes mellitus (HCC)   Transaminitis   Seizure disorder (HCC)   Hyperlipidemia   Principal problem Sepsis due to aspiration pneumonia, acute on chronic hypoxic respiratory failure -likely due to recurrent aspiration.  Has remained stable, she is alert, resume tube feeds at 10 cc/h and closely monitor -Continue antibiotics -Palliative consulted to discuss goals of care given chronic medical problems, recurrent aspiration, recurrent hospitalizations, bedbound status, [name] are ongoing  Active problems 1/4 bottles staph epi -likely contaminant  Acute metabolic encephalopathy-due to #1.  Remains alert  Troponin elevation-likely in the setting of sepsis, demand ischemia  History of ICH/SDH s/p craniotomy, chronic dysphagia -she is more alert today, slowly resume tube feeds  Normocytic anemia- She has been evaluated by GI in the past and was  thought not to be a candidate for endoscopy.  Follow trend   Essential hypertension -Continue  Coreg  Hypokalemia-replace potassium  Leukocytosis-persistent, due to recurrent aspiration  Diabetes mellitus type 2, with long-term use of insulin -Hold metformin. SSI  Lab Results  Component Value Date   HGBA1C 5.8 (H) 02/14/2023   CBG (last 3)  Recent Labs    03/11/23 2032 03/11/23 2311 03/12/23 0559  GLUCAP 98 115* 108*   Transaminitis - Acute on chronic.  AST 209 and ALT 212 acutely elevated from prior with lipase elevated as well.  CT did not note any acute intra-abdominal process.  Improving   History of bladder outlet obstruction -Monitor postvoid residuals    Seizure disorder -Continue Keppra IV   Hyperlipidemia -Held atorvastatin due to acute liver dysfunction   Depression -Continue Prozac   GERD -Continue Protonix  Scheduled Meds:  carvedilol  6.25 mg Per Tube BID WC   ferrous sulfate  300 mg Per Tube Daily   FLUoxetine  20 mg Per Tube QHS   gabapentin  100 mg Per Tube Q8H   guaiFENesin  10 mL Oral BID   insulin aspart  0-15 Units Subcutaneous Q6H   lidocaine  1 patch Transdermal Q24H   pantoprazole (PROTONIX) IV  40 mg Intravenous Q24H   sodium chloride flush  3 mL Intravenous Q12H   Continuous Infusions:  sodium chloride Stopped (03/10/23 1116)   cefTRIAXone (ROCEPHIN)  IV 2 g (03/11/23 1642)   feeding supplement (GLUCERNA 1.5 CAL)     levETIRAcetam 500 mg (03/12/23 1003)   PRN Meds:.sodium chloride, acetaminophen, albuterol, influenza vaccine adjuvanted, mouth rinse  Current Outpatient Medications  Medication Instructions   acetaminophen (TYLENOL) 500 mg, Per Tube, 4 times daily PRN   acetaminophen (TYLENOL) 1,000 mg, Per Tube, 2 times daily   amLODipine (NORVASC) 10  mg, Per Tube, Daily   ascorbic acid (VITAMIN C) 500 mg, Per Tube, Daily   atorvastatin (LIPITOR) 80 mg, Per Tube, Daily   carvedilol (COREG) 6.25 mg, Per Tube, 2 times daily with meals   ezetimibe (ZETIA) 10 mg, Per Tube, Daily   famotidine (PEPCID) 20 mg, Per Tube, 2 times daily    ferrous sulfate 408 mg, Per Tube, Daily   FLUoxetine (PROZAC) 20 mg, Per Tube, Daily at bedtime   gabapentin (NEURONTIN) 100 mg, Per Tube, Every 8 hours   guaiFENesin (ROBITUSSIN) 200 mg, Per Tube, 4 times daily   insulin lispro (HUMALOG) 0-16 Units, Subcutaneous, See admin instructions, Inject 0-15 units 4 times daily per sliding scale:<BR>< 69 : implement hypoglycemic protocol<BR>70-150 : 0 units<BR>151-200 : 3 units<BR>201-250 : 6 units<BR>251-300 : 9 units<BR>301-350 : 12 units<BR>351-400 : 16 units<BR>> 401 : call MD   ipratropium-albuterol (DUONEB) 0.5-2.5 (3) MG/3ML SOLN 3 mLs, Nebulization, Every 4 hours PRN   l-methylfolate-B6-B12 (METANX) 3-35-2 MG TABS tablet 1 tablet, Per Tube, Daily   levETIRAcetam (KEPPRA) 500 mg, Per Tube, 2 times daily   lidocaine 4 % 1 patch, Transdermal, Every 24 hours   metFORMIN (GLUCOPHAGE) 500 mg, Per Tube, 2 times daily with meals   Multiple Vitamins-Minerals (PRESERVISION AREDS 2) CHEW 1 each, Tube, 2 times daily   Nutritional Supplements (FEEDING SUPPLEMENT, GLUCERNA 1.5 CAL,) LIQD 1,000 mLs, Per Tube, Every 24 hours   ondansetron (ZOFRAN) 4 mg, Oral, Every 8 hours PRN   pantoprazole (PROTONIX) 40 mg, Per Tube, Daily   Water For Irrigation, Sterile (FREE WATER) SOLN 120 mLs, Per Tube, Every 4 hours    Diet Orders (From admission, onward)     Start     Ordered   03/09/23 1406  Diet NPO time specified Except for: Other (See Comments)  Diet effective now       Comments: Meds thur tube  Question:  Except for  Answer:  Other (See Comments)   03/09/23 1406            DVT prophylaxis: SCDs Start: 03/09/23 1317   Lab Results  Component Value Date   PLT 302 03/12/2023      Code Status: Full Code  Family Communication: no family at bedside   Status is: Inpatient Remains inpatient appropriate because: severity of illness  Level of care: Progressive  Consultants:  Palliative care  Objective: Vitals:   03/11/23 2007 03/11/23 2300  03/12/23 0300 03/12/23 0808  BP: 113/64 121/62 (!) 145/67 (!) 153/76  Pulse: 80 76    Resp: 19 17 18 18   Temp: 97.8 F (36.6 C) 99.2 F (37.3 C) 98.7 F (37.1 C) 99.4 F (37.4 C)  TempSrc: Axillary Axillary Axillary Axillary  SpO2: 100% 100% 100%   Weight:   53.3 kg   Height:        Intake/Output Summary (Last 24 hours) at 03/12/2023 1142 Last data filed at 03/11/2023 1837 Gross per 24 hour  Intake --  Output 1400 ml  Net -1400 ml   Wt Readings from Last 3 Encounters:  03/12/23 53.3 kg  03/02/23 53.1 kg  02/18/23 56.9 kg    Examination:  Constitutional: NAD Eyes: lids and conjunctivae normal, no scleral icterus ENMT: mmm Neck: normal, supple Respiratory: clear to auscultation bilaterally, no wheezing, no crackles. Normal respiratory effort.  Cardiovascular: Regular rate and rhythm, no murmurs / rubs / gallops. No LE edema. Abdomen: soft, no distention, no tenderness. Bowel sounds positive.    Data Reviewed: I have independently reviewed  following labs and imaging studies   CBC Recent Labs  Lab 03/09/23 0748 03/09/23 1012 03/10/23 0854 03/11/23 0431 03/12/23 0411  WBC 19.6*  --  29.3* 30.6* 22.3*  HGB 9.5* 10.2* 8.5* 7.6* 7.7*  HCT 30.7* 30.0* 25.8* 24.1* 24.1*  PLT 461*  --  326 346 302  MCV 98.4  --  98.1 98.8 100.0  MCH 30.4  --  32.3 31.1 32.0  MCHC 30.9  --  32.9 31.5 32.0  RDW 16.6*  --  17.2* 17.2* 17.1*  LYMPHSABS 0.4*  --   --   --   --   MONOABS 1.0  --   --   --   --   EOSABS 0.0  --   --   --   --   BASOSABS 0.0  --   --   --   --     Recent Labs  Lab 03/09/23 0748 03/09/23 0849 03/09/23 1008 03/09/23 1012 03/09/23 1103 03/09/23 1606 03/10/23 0854 03/10/23 0958 03/10/23 1104 03/11/23 0431 03/12/23 0411  NA 133*  --   --  138  --   --   --  139  --  140 142  K 4.0  --   --  4.0  --   --   --  3.8  --  3.3* 3.4*  CL 97*  --   --   --   --   --   --  104  --  104 105  CO2 25  --   --   --   --   --   --  28  --  25 26  GLUCOSE 268*   --   --   --   --   --   --  142*  --  130* 99  BUN 28*  --   --   --   --   --   --  25*  --  16 12  CREATININE 0.94  --   --   --   --   --   --  0.74  --  0.64 0.58  CALCIUM 8.9  --   --   --   --   --   --  9.0  --  8.9 8.9  AST 209*  --   --   --   --   --   --  112*  --   --  67*  ALT 212*  --   --   --   --   --   --  137*  --   --  84*  ALKPHOS 123  --   --   --   --   --   --  83  --   --  79  BILITOT 0.4  --   --   --   --   --   --  0.4  --   --  0.5  ALBUMIN 2.7*  --   --   --   --   --   --  2.4*  --   --  2.2*  MG  --   --   --   --   --   --   --   --   --   --  1.7  LATICACIDVEN  --    < > 4.0*  --  3.6* 3.0* 1.9  --  2.1*  --   --   BNP 235.4*  --   --   --   --   --   --   --   --   --   --    < > =  values in this interval not displayed.    ------------------------------------------------------------------------------------------------------------------ No results for input(s): "CHOL", "HDL", "LDLCALC", "TRIG", "CHOLHDL", "LDLDIRECT" in the last 72 hours.  Lab Results  Component Value Date   HGBA1C 5.8 (H) 02/14/2023   ------------------------------------------------------------------------------------------------------------------ No results for input(s): "TSH", "T4TOTAL", "T3FREE", "THYROIDAB" in the last 72 hours.  Invalid input(s): "FREET3"  Cardiac Enzymes No results for input(s): "CKMB", "TROPONINI", "MYOGLOBIN" in the last 168 hours.  Invalid input(s): "CK" ------------------------------------------------------------------------------------------------------------------    Component Value Date/Time   BNP 235.4 (H) 03/09/2023 0748    CBG: Recent Labs  Lab 03/11/23 0802 03/11/23 1623 03/11/23 2032 03/11/23 2311 03/12/23 0559  GLUCAP 123* 127* 98 115* 108*    Recent Results (from the past 240 hour(s))  Resp panel by RT-PCR (RSV, Flu A&B, Covid) Anterior Nasal Swab     Status: None   Collection Time: 03/09/23  7:23 AM   Specimen: Anterior Nasal  Swab  Result Value Ref Range Status   SARS Coronavirus 2 by RT PCR NEGATIVE NEGATIVE Final   Influenza A by PCR NEGATIVE NEGATIVE Final   Influenza B by PCR NEGATIVE NEGATIVE Final    Comment: (NOTE) The Xpert Xpress SARS-CoV-2/FLU/RSV plus assay is intended as an aid in the diagnosis of influenza from Nasopharyngeal swab specimens and should not be used as a sole basis for treatment. Nasal washings and aspirates are unacceptable for Xpert Xpress SARS-CoV-2/FLU/RSV testing.  Fact Sheet for Patients: BloggerCourse.com  Fact Sheet for Healthcare Providers: SeriousBroker.it  This test is not yet approved or cleared by the [place] FDA and has been authorized for detection and/or diagnosis of SARS-CoV-2 by FDA under an Emergency Use Authorization (EUA). This EUA will remain in effect (meaning this test can be used) for the duration of the COVID-19 declaration under Section 564(b)(1) of the Act, 21 U.S.C. section 360bbb-3(b)(1), unless the authorization is terminated or revoked.     Resp Syncytial Virus by PCR NEGATIVE NEGATIVE Final    Comment: (NOTE) Fact Sheet for Patients: BloggerCourse.com  Fact Sheet for Healthcare Providers: SeriousBroker.it  This test is not yet approved or cleared by the [place] FDA and has been authorized for detection and/or diagnosis of SARS-CoV-2 by FDA under an Emergency Use Authorization (EUA). This EUA will remain in effect (meaning this test can be used) for the duration of the COVID-19 declaration under Section 564(b)(1) of the Act, 21 U.S.C. section 360bbb-3(b)(1), unless the authorization is terminated or revoked.  Performed at [facility], [address] [place], [place] [address]   Blood culture (routine x 2)     Status: Abnormal   Collection Time: 03/09/23  8:40 AM   Specimen: BLOOD LEFT ARM  Result Value Ref Range  Status   Specimen Description BLOOD LEFT ARM  Final   Special Requests   Final    BOTTLES DRAWN AEROBIC AND ANAEROBIC Blood Culture adequate volume   Culture  Setup Time   Final    GRAM POSITIVE COCCI IN CLUSTERS ANAEROBIC BOTTLE ONLY CRITICAL RESULT CALLED TO, READ BACK BY AND VERIFIED WITH: PHARMD J. LEDFORD 03/10/23 @ 0606 BY AB    Culture (A)  Final    STAPHYLOCOCCUS HOMINIS THE SIGNIFICANCE OF ISOLATING THIS ORGANISM FROM A SINGLE SET OF BLOOD CULTURES WHEN MULTIPLE SETS ARE DRAWN IS UNCERTAIN. PLEASE NOTIFY THE MICROBIOLOGY DEPARTMENT WITHIN ONE WEEK IF SPECIATION AND SENSITIVITIES ARE REQUIRED. Performed at [facility], [address], [place], [place] [address]    Report Status 03/12/2023 FINAL  Final  Blood Culture ID Panel (Reflexed)     Status: Abnormal   Collection Time: 03/09/23  8:40 AM  Result Value Ref Range Status   Enterococcus faecalis NOT DETECTED NOT DETECTED Final   Enterococcus Faecium NOT DETECTED NOT DETECTED Final   Listeria monocytogenes NOT DETECTED NOT DETECTED Final   Staphylococcus species DETECTED (A) NOT DETECTED Final    Comment: CRITICAL RESULT CALLED TO, READ BACK BY AND VERIFIED WITH: PHARMD J. LEDFORD 03/10/23 @ 0606 BY AB    Staphylococcus aureus (BCID) NOT DETECTED NOT DETECTED Final   Staphylococcus epidermidis DETECTED (A) NOT DETECTED Final    Comment: Methicillin (oxacillin) resistant coagulase negative staphylococcus. Possible blood culture contaminant (unless isolated from more than one blood culture draw or clinical case suggests pathogenicity). No antibiotic treatment is indicated for blood  culture contaminants. CRITICAL RESULT CALLED TO, READ BACK BY AND VERIFIED WITH: PHARMD J. LEDFORD 03/10/23 @ 0606 BY AB    Staphylococcus lugdunensis NOT DETECTED NOT DETECTED Final   Streptococcus species NOT DETECTED NOT DETECTED Final   Streptococcus agalactiae NOT DETECTED NOT DETECTED Final   Streptococcus pneumoniae NOT DETECTED NOT  DETECTED Final   Streptococcus pyogenes NOT DETECTED NOT DETECTED Final   A.calcoaceticus-baumannii NOT DETECTED NOT DETECTED Final   Bacteroides fragilis NOT DETECTED NOT DETECTED Final   Enterobacterales NOT DETECTED NOT DETECTED Final   Enterobacter cloacae complex NOT DETECTED NOT DETECTED Final   Escherichia coli NOT DETECTED NOT DETECTED Final   Klebsiella aerogenes NOT DETECTED NOT DETECTED Final   Klebsiella oxytoca NOT DETECTED NOT DETECTED Final   Klebsiella pneumoniae NOT DETECTED NOT DETECTED Final   Proteus species NOT DETECTED NOT DETECTED Final   Salmonella species NOT DETECTED NOT DETECTED Final   Serratia marcescens NOT DETECTED NOT DETECTED Final   Haemophilus influenzae NOT DETECTED NOT DETECTED Final   Neisseria meningitidis NOT DETECTED NOT DETECTED Final   Pseudomonas aeruginosa NOT DETECTED NOT DETECTED Final   Stenotrophomonas maltophilia NOT DETECTED NOT DETECTED Final   Candida albicans NOT DETECTED NOT DETECTED Final   Candida auris NOT DETECTED NOT DETECTED Final   Candida glabrata NOT DETECTED NOT DETECTED Final   Candida krusei NOT DETECTED NOT DETECTED Final   Candida parapsilosis NOT DETECTED NOT DETECTED Final   Candida tropicalis NOT DETECTED NOT DETECTED Final   Cryptococcus neoformans/gattii NOT DETECTED NOT DETECTED Final   Methicillin resistance mecA/C DETECTED (A) NOT DETECTED Final    Comment: CRITICAL RESULT CALLED TO, READ BACK BY AND VERIFIED WITH: PHARMD J. LEDFORD 03/10/23 @ 0606 BY AB Performed at [facility], [address], [place], [place] [address]   Blood culture (routine x 2)     Status: None (Preliminary result)   Collection Time: 03/09/23  8:41 AM   Specimen: BLOOD RIGHT ARM  Result Value Ref Range Status   Specimen Description BLOOD RIGHT ARM  Final   Special Requests   Final    BOTTLES DRAWN AEROBIC AND ANAEROBIC Blood Culture adequate volume   Culture   Final    NO GROWTH 3 DAYS Performed at [facility],  [address], [place], [place] [address]    Report Status PENDING  Incomplete  Culture, Respiratory w Gram Stain     Status: None (Preliminary result)   Collection Time: 03/09/23  3:46 PM   Specimen: Tracheal Aspirate  Result Value Ref Range Status   Specimen Description TRACHEAL ASPIRATE  Final   Special Requests NONE  Final   Gram Stain  NO WBC SEEN FEW BUDDING YEAST SEEN   Final   Culture   Final    FEW YEAST IDENTIFICATION TO FOLLOW Performed at [facility], [address], [place], [place] [address]    Report Status PENDING  Incomplete  MRSA Next Gen by PCR, Nasal     Status: None   Collection Time: 03/09/23  8:29 PM   Specimen: Nasal Mucosa; Nasal Swab  Result Value Ref Range Status   MRSA by PCR Next Gen NOT DETECTED NOT DETECTED Final    Comment: (NOTE) The GeneXpert MRSA Assay (FDA approved for NASAL specimens only), is one component of a comprehensive MRSA colonization surveillance program. It is not intended to diagnose MRSA infection nor to guide or monitor treatment for MRSA infections. Test performance is not FDA approved in patients less than 28 years old. Performed at [facility], [address] [place], [place] [address]      Radiology Studies: No results found.   Pamella Pert, MD, PhD [facility]  Between 7 am - 7 pm I am available, please contact me via Amion (for emergencies) or Securechat (non urgent messages)  Between 7 pm - 7 am I am not available, please contact night coverage MD/APP via Amion

## 2023-03-12 NOTE — Progress Notes (Signed)
Daily Progress Note   Patient Name: Gina Kelley       Date: 03/12/2023 DOB: Nov 07, 1935  Age: 87 y.o. MRN#: [id number] Attending Physician: Leatha Gilding, MD Primary Care Physician: Patient, No Pcp Per Admit Date: 03/09/2023  Reason for Consultation/Follow-up: Establishing goals of care  Subjective: Medical records reviewed including progress notes, labs, imaging. Patient assessed at the bedside. She is receiving personal care from RN and NT.  Her daughter [name] is present visiting.  We relocated to 4 E. waiting room and she called her siblings Princess Perna, and Gifford Shave to participate in the scheduled family meeting.  We reviewed patient's acute illness and medical care received during this hospitalization, highlighting patient's unfortunate recurrent complications with now 3 hospitalizations due to similar issues.  Provided education on aspiration pneumonia as a sequelae of her brain injury and overall weakness/debility.  We discussed that this could be from reflux/tube feeds as well as oral secretions, with limited additional options to manage this.  Shared my concern that she will likely continue in a cycle in this manner with poor prognosis and high risk of prolonged suffering.  Patient's family verbalized her understanding, sharing that they understand this is a severe situation and has is ultimately in God's hands.  I encouraged patient's family to consider that if this will likely be her new normal rather than the recovery that was hoped for, would this be acceptable quality of life for her.  Recommended consideration of comfort focused care, counseling on the option of comfort feeds and possible hospice facility placement rather than recurrent hospitalizations.  Family are concerned that  she would not get IV hydration.  We discussed that over the natural course at end-of-life, artificial nutrition and hydration would likely cause more harm than benefit.  Patient family would like more time for further discussion among some cells, requesting to reach out to PMT tomorrow.  I shared that while I would not be present, my colleague Corrie Dandy will return.  Also encouraged consideration of CODE STATUS changed to DNR even if continued aggressive medical care is desired, as the cause of the arrest is likely associated with chronic/terminal disease rather than a reversible acute cardio-pulmonary event.  Family verbalized understanding.  Questions and concerns addressed. PMT will continue to support holistically.   Length of Stay: 3  Physical  Exam Vitals and nursing note reviewed.  Constitutional:      General: She is not in acute distress.    Appearance: She is ill-appearing.  Cardiovascular:     Rate and Rhythm: Normal rate.  Pulmonary:     Effort: Pulmonary effort is normal.  Neurological:     Mental Status: She is alert.  Psychiatric:        Mood and Affect: Mood normal.        Behavior: Behavior normal.            Vital Signs: BP (!) 153/76 (BP Location: Left Arm)   Pulse 76   Temp 99.4 F (37.4 C) (Axillary)   Resp 18   Ht 5\' 3"  (1.6 m)   Wt 53.3 kg   SpO2 100%   BMI 20.82 kg/m  SpO2: SpO2: 100 % O2 Device: O2 Device: Nasal Cannula O2 Flow Rate: O2 Flow Rate (L/min): 4 L/min      Palliative Assessment/Data: 10%   Palliative Care Assessment & Plan   Patient Profile: 87 y.o. female  admitted on 03/09/2023 with past medical history significant of HTN, DM type II, large spontaneous subdural hematoma and intracerebral hemorrhage in 06/2022 requiring craniotomy, status post chronic indwelling Foley catheter and pain, and chronic  respiratory failure on 5 L of nasal cannula oxygen, history of GERD, bed bound resident of [facility] [name] presents after having drop in her O2  saturations. Staff noted O2 saturations dropped to 74% on 5 L and called EMS.   She just recently was hospitalized from 9/1-9/4 acute metabolic encephalopathy thought secondary to a UTI.  Prior to that patient had been hospitalized 8/16-8/21 with sepsis secondary to aspiration pneumonia.     Admitted for treatment and stabilization.    CT scan of the head acute stable sequela of right intraparenchymal hemorrhage.  CT scan of the chest abdomen pelvis noted patchy by lateral airspace opacities compatible with multifocal pneumonia    Family face treatment option decisions, advanced directive decisions and anticipatory care needs.  Assessment: Goals of care conversation Aspiration pneumonia, recurrent Sepsis due to aspiration pneumonia Acute on chronic hypoxic respiratory failure Acute metabolic encephalopathy  Recommendations/Plan: Continue full code/full scope treatment Patient's family understand severity of her illness and recommendations for comfort focused care.  They would like more time to discuss amongst themselves and then reconvene with PMT tomorrow Psychosocial and emotional support provided PMT will continue to follow and support   Prognosis: Poor  Discharge Planning: To Be Determined  Care plan was discussed with patient, patient's adult children, MD   Total time: I spent 65 minutes in the care of the patient today in the above activities and documenting the encounter.  MDM high         Anderia Lorenzo Jeni Salles, PA-C  Palliative Medicine Team Team phone # [phone]  Thank you for allowing the Palliative Medicine Team to assist in the care of this patient. Please utilize secure chat with additional questions, if there is no response within 30 minutes please call the above phone number.  Palliative Medicine Team providers are available by phone from 7am to 7pm daily and can be reached through the team cell phone.  Should this patient require assistance outside of  these hours, please call the patient's attending physician.

## 2023-03-12 NOTE — Care Management Important Message (Signed)
Important Message  Patient Details  Name: Gina Kelley MRN: [id number] Date of Birth: 04-29-1936   Medicare Important Message Given:  Yes     Sherilyn Banker 03/12/2023, 3:29 PM

## 2023-03-12 NOTE — Plan of Care (Signed)
Poc progressing.  

## 2023-03-12 NOTE — Progress Notes (Addendum)
Nutrition Follow-up  DOCUMENTATION CODES:   Non-severe (moderate) malnutrition in context of chronic illness  INTERVENTION:  - Advance TF to goal rate as tolerated.   - MD ordered Glucerna 1.5 @ 10 mL//hr  - MD would like to manually advance TF himself.   - Goal TF rate:  Glucerna 1.5@40ml /hr continuous  Free water flushes q4 hours  Regimen provides 1440kcal/day, 79g/day protein and 1469ml/day of free water.    - Add MVI daily via tube   NUTRITION DIAGNOSIS:   Moderate Malnutrition related to chronic illness as evidenced by moderate fat depletion, severe muscle depletion.  GOAL:   Patient will meet greater than or equal to 90% of their needs - Not met.   MONITOR:   Weight trends, TF tolerance, Skin, I & O's, Labs, Diet advancement  REASON FOR ASSESSMENT:   Consult Enteral/tube feeding initiation and management  ASSESSMENT:   87 y.o. female with PMHx including HTN, T2DM, large spontaneous subdural hematoma and intracerebral hemorrhage in January 2024 requiring craniotomy, PEG, s/p chronic indwelling foley catheter and pain, chronic respiratory failure, GERD, PUD, bed bound who presents due to drops in her O2 saturation. Patient admitted for sepsis due to pneumonia  Meds reviewed:  ferrous sulfate, sliding scale insulin. Labs reviewed: K low, AST/ALT elevated.   Per previous RD note, TF were held due to pt aspirating. TF were still on hold this am. RD checked in with MD today who started the TF back at 10 mL/hr. MD states that he would like to manually adjust TF orders himself. RD will add MVI q day via tube, as pt is just below the threshold to meet her RDIs with TF volume goal rate. RD will continue to monitor TF tolerance and advancement.   Per previous RD note, pt's baseline is receiving a pureed diet during the day and tube feeds at night. However, RD recommends running TF at 40 mL/hr continuously since pt is NPO.   Diet Order:   Diet Order             Diet  NPO time specified Except for: Other (See Comments)  Diet effective now                   EDUCATION NEEDS:   Not appropriate for education at this time  Skin:  Skin Assessment: Reviewed RN Assessment  Last BM:  PTA  Height:   Ht Readings from Last 1 Encounters:  03/09/23 5\' 3"  (1.6 m)    Weight:   Wt Readings from Last 1 Encounters:  03/12/23 53.3 kg    Ideal Body Weight:     BMI:  Body mass index is 20.82 kg/m.  Estimated Nutritional Needs:   Kcal:  1400-1600  Protein:  65-80 g  Fluid:  >/= 1.6 L  Bethann Humble, RD, LDN, CNSC.

## 2023-03-13 DIAGNOSIS — Z515 Encounter for palliative care: Secondary | ICD-10-CM | POA: Diagnosis not present

## 2023-03-13 DIAGNOSIS — Z8679 Personal history of other diseases of the circulatory system: Secondary | ICD-10-CM | POA: Diagnosis not present

## 2023-03-13 DIAGNOSIS — J189 Pneumonia, unspecified organism: Secondary | ICD-10-CM | POA: Diagnosis not present

## 2023-03-13 DIAGNOSIS — G9341 Metabolic encephalopathy: Secondary | ICD-10-CM | POA: Diagnosis not present

## 2023-03-13 DIAGNOSIS — R1310 Dysphagia, unspecified: Secondary | ICD-10-CM | POA: Diagnosis not present

## 2023-03-13 DIAGNOSIS — A419 Sepsis, unspecified organism: Secondary | ICD-10-CM | POA: Diagnosis not present

## 2023-03-13 LAB — BASIC METABOLIC PANEL
Anion gap: 12 (ref 5–15)
BUN: 15 mg/dL (ref 8–23)
CO2: 29 mmol/L (ref 22–32)
Calcium: 9.1 mg/dL (ref 8.9–10.3)
Chloride: 103 mmol/L (ref 98–111)
Creatinine, Ser: 0.63 mg/dL (ref 0.44–1.00)
GFR, Estimated: >60 mL/min (ref >60)
Glucose, Bld: 137 mg/dL — ABNORMAL HIGH (ref 70–99)
Potassium: 3 mmol/L — ABNORMAL LOW (ref 3.5–5.1)
Sodium: 144 mmol/L (ref 135–145)

## 2023-03-13 LAB — CBC
HCT: 26 % — ABNORMAL LOW (ref 36.0–46.0)
Hemoglobin: 8 g/dL — ABNORMAL LOW (ref 12.0–15.0)
MCH: 30.4 pg (ref 26.0–34.0)
MCHC: 30.8 g/dL (ref 30.0–36.0)
MCV: 98.9 fL (ref 80.0–100.0)
Platelets: 295 10*3/uL (ref 150–400)
RBC: 2.63 MIL/uL — ABNORMAL LOW (ref 3.87–5.11)
RDW: 16.5 % — ABNORMAL HIGH (ref 11.5–15.5)
WBC: 12.9 10*3/uL — ABNORMAL HIGH (ref 4.0–10.5)
nRBC: 0 % (ref 0.0–0.2)

## 2023-03-13 LAB — GLUCOSE, CAPILLARY
Glucose-Capillary: 137 mg/dL — ABNORMAL HIGH (ref 70–99)
Glucose-Capillary: 155 mg/dL — ABNORMAL HIGH (ref 70–99)
Glucose-Capillary: 169 mg/dL — ABNORMAL HIGH (ref 70–99)
Glucose-Capillary: 184 mg/dL — ABNORMAL HIGH (ref 70–99)
Glucose-Capillary: 78 mg/dL (ref 70–99)

## 2023-03-13 MED ORDER — HYDRALAZINE HCL 20 MG/ML IJ SOLN
5.0000 mg | INTRAMUSCULAR | Status: DC | PRN
  Administered 2023-03-14: 5 mg via INTRAVENOUS
  Filled 2023-03-13: qty 1

## 2023-03-13 MED ORDER — AMOXICILLIN-POT CLAVULANATE 400-57 MG/5ML PO SUSR
800.0000 mg | Freq: Two times a day (BID) | ORAL | Status: DC
  Administered 2023-03-13 – 2023-03-16 (×7): 800 mg via ORAL
  Filled 2023-03-13 (×9): qty 10

## 2023-03-13 MED ORDER — GLUCERNA 1.5 CAL PO LIQD
1000.0000 mL | ORAL | Status: DC
  Administered 2023-03-13: 1000 mL
  Filled 2023-03-13: qty 1000

## 2023-03-13 NOTE — Progress Notes (Signed)
Patient ID: ANAIDA CEJA, female   DOB: 12-02-1935, 87 y.o.   MRN: [id number]    Progress Note from the Palliative Medicine Team at [facility]   Patient Name: Gina Kelley        Date: 03/13/2023 DOB: [birth year]  Age: 87 y.o. MRN#: [id number] Attending Physician: Leatha Gilding, MD Primary Care Physician: Patient, No Pcp Per Admit Date: 03/09/2023   Reason for Consultation/Follow-up   Establishing  goals of care  HPI/ Brief Hospital Review  87 y.o. female   admitted on 03/09/2023 with past medical history significant of HTN, DM type II, large spontaneous subdural hematoma and intracerebral hemorrhage in 06/2022 requiring craniotomy, status post chronic indwelling Foley catheter and pain, and chronic  respiratory failure on 5 L of nasal cannula oxygen, history of GERD, bed bound resident of [name] presents after having drop in her O2 saturations. Staff noted O2 saturations dropped to 74% on 5 L and called EMS.       Recently hospitalized from 9/1-9/4 acute metabolic encephalopathy thought secondary to a UTI.  Prior to that patient had been hospitalized 8/16-8/21 with sepsis secondary to aspiration pneumonia.     Admitted for treatment and stabilization.     CT scan of the head acute stable sequela of right intraparenchymal hemorrhage.  CT scan of the chest abdomen pelvis noted patchy by lateral airspace opacities compatible with multifocal pneumonia   Patient has continued physical, functional and cognitive decline since January 2024, post spontaneous subdural hematoma and intracranial hemorrhage requiring craniotomy.  She is a resident of [name] skilled nursing facility for long-term care     Family face treatment option decisions, advanced directive decisions and anticipatory care needs.     Subjective  Extensive chart review has been completed prior to patient assessment including labs, vital signs, imaging, progress/consult notes, orders, medications and  available advance directive documents.    This NP assessed patient at the bedside as a follow up for palliative medicine needs and emotional support, and then spoke with daughter by phone for ongoing goals of care conversation.  Education offered on patient's current medical situation specific to her multiple comorbidities as it is reflected in adult failure to thrive.  We discussed the natural trajectory and expectations at end-of-life specific to decreased mobility, decreased oral intake, increased risk of infection.  Education offered on the limitations of medical interventions to prolong quality of life when the body fails to thrive.  We spoke specifically to the importance of of advance care planning.  Again CODE STATUS was addressed.  Educated family to consider DNR/DNI status understanding evidenced based poor outcomes in similar hospitalized patient, as the cause of arrest is likely associated with advanced chronic illness rather than an easily reversible acute cardio-pulmonary event.  Education offered on the difference between a full medical support path and a palliative comfort path.  Education offered on hospice benefit; philosophy and eligibility.       Later in the afternoon I spoke to multiply family members/conference call by telephone and readdressed above concepts  Education offered on important AD decisions pending; treatment options, code status and future care needs.  We discussed her overall failure to thrive.    Family is open to all offered and available medical interventions to prolong life   Education offered today regarding  the importance of continued conversation with family and their  medical providers regarding overall plan of care and treatment options,  ensuring decisions are within the context  of the patients values and GOCs.  Questions and concerns addressed   Discussed with primary team and nursing staff  Time: 75  minutes      Discussed with attending and  treatment team via secure chat  Detailed review of medical records ( labs, imaging, vital signs), medically appropriate exam ( MS, skin, cardia,  resp)   discussed with treatment team, counseling and education to patient, family, staff, documenting clinical information, medication management, coordination of care    Lorinda Creed NP  Palliative Medicine Team Team Phone # [phone] Pager [phone]

## 2023-03-13 NOTE — Progress Notes (Signed)
PROGRESS NOTE  Gina Kelley WJX:[id number] DOB: July 06, 1935 DOA: 03/09/2023 PCP: Patient, No Pcp Per   LOS: 4 days   Brief Narrative / Interim history: 87 year old female with HTN, DM2, large spontaneous subdural hematoma and intracerebral hemorrhage January 2024 requiring craniotomy, history of left-sided hemiparesis dysphagia with PEG tube, chronic Foley catheter, chronic hypoxia on 5 L bedbound status, living in a facility comes into the hospital with hypoxia.  She was recently hospitalized 9/1-9/4 with encephalopathy due to UTI.  Subjective / 24h Interval events: Appears comfortable, alert.  Assesement and Plan: Principal Problem:   Sepsis due to pneumonia [facility]) Active Problems:   Chronic respiratory failure with hypoxia (HCC)   Elevated troponin   S/P craniotomy   History of intracranial hemorrhage   Dysphagia   Acute metabolic encephalopathy   Normocytic anemia   Essential hypertension   Type 2 diabetes mellitus (HCC)   Transaminitis   Seizure disorder (HCC)   Hyperlipidemia   Principal problem Sepsis due to aspiration pneumonia, acute on chronic hypoxic respiratory failure -likely due to recurrent aspiration.  Has remained stable, she is alert, feeds resumed on 9/12 at 10 cc/h, no further aspiration events, can increase to 20 cc/h.  Closely monitor -Continue antibiotics -Palliative consulted to discuss goals of care given chronic medical problems, recurrent aspiration, recurrent hospitalizations, bedbound status, [name] are ongoing  Active problems 1/4 bottles staph epi -likely contaminant  Acute metabolic encephalopathy-due to #1.  Remains alert  Troponin elevation-likely in the setting of sepsis, demand ischemia  History of ICH/SDH s/p craniotomy, chronic dysphagia -she is more alert today, increased feeds today  Normocytic anemia- She has been evaluated by GI in the past and was  thought not to be a candidate for endoscopy.  Follow trend   Essential  hypertension -Continue Coreg  Hypokalemia-replace potassium, recheck in the morning.  Check magnesium as well  Leukocytosis-persistent, due to recurrent aspiration  Diabetes mellitus type 2, with long-term use of insulin -Hold metformin. SSI  Lab Results  Component Value Date   HGBA1C 5.8 (H) 02/14/2023   CBG (last 3)  Recent Labs    03/12/23 2040 03/13/23 0004 03/13/23 0804  GLUCAP 123* 78 155*   Transaminitis - Acute on chronic.  AST 209 and ALT 212 acutely elevated from prior with lipase elevated as well.  CT did not note any acute intra-abdominal process.  Improving   History of bladder outlet obstruction -Monitor postvoid residuals    Seizure disorder -Continue Keppra IV   Hyperlipidemia -Held atorvastatin due to acute liver dysfunction   Depression -Continue Prozac   GERD -Continue Protonix  Scheduled Meds:  carvedilol  6.25 mg Per Tube BID WC   ferrous sulfate  300 mg Per Tube Daily   FLUoxetine  20 mg Per Tube QHS   gabapentin  100 mg Per Tube Q8H   guaiFENesin  10 mL Oral BID   insulin aspart  0-15 Units Subcutaneous Q6H   lidocaine  1 patch Transdermal Q24H   multivitamin with minerals  1 tablet Per Tube Daily   pantoprazole (PROTONIX) IV  40 mg Intravenous Q24H   sodium chloride flush  3 mL Intravenous Q12H   Continuous Infusions:  sodium chloride Stopped (03/10/23 1116)   cefTRIAXone (ROCEPHIN)  IV Stopped (03/12/23 2150)   feeding supplement (GLUCERNA 1.5 CAL) 1,000 mL (03/12/23 1407)   levETIRAcetam 500 mg (03/13/23 0850)   PRN Meds:.sodium chloride, acetaminophen, albuterol, influenza vaccine adjuvanted, mouth rinse  Current Outpatient Medications  Medication Instructions   acetaminophen (  TYLENOL) 500 mg, Per Tube, 4 times daily PRN   acetaminophen (TYLENOL) 1,000 mg, Per Tube, 2 times daily   amLODipine (NORVASC) 10 mg, Per Tube, Daily   ascorbic acid (VITAMIN C) 500 mg, Per Tube, Daily   atorvastatin (LIPITOR) 80 mg, Per Tube, Daily    carvedilol (COREG) 6.25 mg, Per Tube, 2 times daily with meals   ezetimibe (ZETIA) 10 mg, Per Tube, Daily   famotidine (PEPCID) 20 mg, Per Tube, 2 times daily   ferrous sulfate 408 mg, Per Tube, Daily   FLUoxetine (PROZAC) 20 mg, Per Tube, Daily at bedtime   gabapentin (NEURONTIN) 100 mg, Per Tube, Every 8 hours   guaiFENesin (ROBITUSSIN) 200 mg, Per Tube, 4 times daily   insulin lispro (HUMALOG) 0-16 Units, Subcutaneous, See admin instructions, Inject 0-15 units 4 times daily per sliding scale:<BR>< 69 : implement hypoglycemic protocol<BR>70-150 : 0 units<BR>151-200 : 3 units<BR>201-250 : 6 units<BR>251-300 : 9 units<BR>301-350 : 12 units<BR>351-400 : 16 units<BR>> 401 : call MD   ipratropium-albuterol (DUONEB) 0.5-2.5 (3) MG/3ML SOLN 3 mLs, Nebulization, Every 4 hours PRN   l-methylfolate-B6-B12 (METANX) 3-35-2 MG TABS tablet 1 tablet, Per Tube, Daily   levETIRAcetam (KEPPRA) 500 mg, Per Tube, 2 times daily   lidocaine 4 % 1 patch, Transdermal, Every 24 hours   metFORMIN (GLUCOPHAGE) 500 mg, Per Tube, 2 times daily with meals   Multiple Vitamins-Minerals (PRESERVISION AREDS 2) CHEW 1 each, Tube, 2 times daily   Nutritional Supplements (FEEDING SUPPLEMENT, GLUCERNA 1.5 CAL,) LIQD 1,000 mLs, Per Tube, Every 24 hours   ondansetron (ZOFRAN) 4 mg, Oral, Every 8 hours PRN   pantoprazole (PROTONIX) 40 mg, Per Tube, Daily   Water For Irrigation, Sterile (FREE WATER) SOLN 120 mLs, Per Tube, Every 4 hours    Diet Orders (From admission, onward)     Start     Ordered   03/09/23 1406  Diet NPO time specified Except for: Other (See Comments)  Diet effective now       Comments: Meds thur tube  Question:  Except for  Answer:  Other (See Comments)   03/09/23 1406            DVT prophylaxis: SCDs Start: 03/09/23 1317   Lab Results  Component Value Date   PLT 302 03/12/2023      Code Status: Full Code  Family Communication: no family at bedside   Status is: Inpatient Remains inpatient  appropriate because: severity of illness  Level of care: Progressive  Consultants:  Palliative care  Objective: Vitals:   03/12/23 2028 03/13/23 0005 03/13/23 0501 03/13/23 0735  BP: (!) 165/82 (!) 149/74 (!) 166/83 (!) 171/80  Pulse: 75 72 79   Resp: 19 17 19    Temp: 98.8 F (37.1 C) 98.4 F (36.9 C) (!) 97.4 F (36.3 C) 98.5 F (36.9 C)  TempSrc: Oral Axillary Axillary Axillary  SpO2: 98% 100% 100%   Weight:   49.8 kg   Height:        Intake/Output Summary (Last 24 hours) at 03/13/2023 0945 Last data filed at 03/13/2023 9528 Gross per 24 hour  Intake 751.8 ml  Output 400 ml  Net 351.8 ml   Wt Readings from Last 3 Encounters:  03/13/23 49.8 kg  03/02/23 53.1 kg  02/18/23 56.9 kg    Examination:  Constitutional: NAD Eyes: lids and conjunctivae normal, no scleral icterus ENMT: mmm Neck: normal, supple Respiratory: clear to auscultation bilaterally, no wheezing, no crackles. Normal respiratory effort.  Cardiovascular: Regular rate  and rhythm, no murmurs / rubs / gallops. No LE edema. Abdomen: soft, no distention, no tenderness. Bowel sounds positive.   Data Reviewed: I have independently reviewed following labs and imaging studies   CBC Recent Labs  Lab 03/09/23 0748 03/09/23 1012 03/10/23 0854 03/11/23 0431 03/12/23 0411  WBC 19.6*  --  29.3* 30.6* 22.3*  HGB 9.5* 10.2* 8.5* 7.6* 7.7*  HCT 30.7* 30.0* 25.8* 24.1* 24.1*  PLT 461*  --  326 346 302  MCV 98.4  --  98.1 98.8 100.0  MCH 30.4  --  32.3 31.1 32.0  MCHC 30.9  --  32.9 31.5 32.0  RDW 16.6*  --  17.2* 17.2* 17.1*  LYMPHSABS 0.4*  --   --   --   --   MONOABS 1.0  --   --   --   --   EOSABS 0.0  --   --   --   --   BASOSABS 0.0  --   --   --   --     Recent Labs  Lab 03/09/23 0748 03/09/23 0849 03/09/23 1008 03/09/23 1012 03/09/23 1103 03/09/23 1606 03/10/23 0854 03/10/23 0958 03/10/23 1104 03/11/23 0431 03/12/23 0411  NA 133*  --   --  138  --   --   --  139  --  140 142  K 4.0   --   --  4.0  --   --   --  3.8  --  3.3* 3.4*  CL 97*  --   --   --   --   --   --  104  --  104 105  CO2 25  --   --   --   --   --   --  28  --  25 26  GLUCOSE 268*  --   --   --   --   --   --  142*  --  130* 99  BUN 28*  --   --   --   --   --   --  25*  --  16 12  CREATININE 0.94  --   --   --   --   --   --  0.74  --  0.64 0.58  CALCIUM 8.9  --   --   --   --   --   --  9.0  --  8.9 8.9  AST 209*  --   --   --   --   --   --  112*  --   --  67*  ALT 212*  --   --   --   --   --   --  137*  --   --  84*  ALKPHOS 123  --   --   --   --   --   --  83  --   --  79  BILITOT 0.4  --   --   --   --   --   --  0.4  --   --  0.5  ALBUMIN 2.7*  --   --   --   --   --   --  2.4*  --   --  2.2*  MG  --   --   --   --   --   --   --   --   --   --  1.7  LATICACIDVEN  --    < >  4.0*  --  3.6* 3.0* 1.9  --  2.1*  --   --   BNP 235.4*  --   --   --   --   --   --   --   --   --   --    < > = values in this interval not displayed.    ------------------------------------------------------------------------------------------------------------------ No results for input(s): "CHOL", "HDL", "LDLCALC", "TRIG", "CHOLHDL", "LDLDIRECT" in the last 72 hours.  Lab Results  Component Value Date   HGBA1C 5.8 (H) 02/14/2023   ------------------------------------------------------------------------------------------------------------------ No results for input(s): "TSH", "T4TOTAL", "T3FREE", "THYROIDAB" in the last 72 hours.  Invalid input(s): "FREET3"  Cardiac Enzymes No results for input(s): "CKMB", "TROPONINI", "MYOGLOBIN" in the last 168 hours.  Invalid input(s): "CK" ------------------------------------------------------------------------------------------------------------------    Component Value Date/Time   BNP 235.4 (H) 03/09/2023 0748    CBG: Recent Labs  Lab 03/12/23 0559 03/12/23 1419 03/12/23 2040 03/13/23 0004 03/13/23 0804  GLUCAP 108* 134* 123* 78 155*    Recent Results  (from the past 240 hour(s))  Resp panel by RT-PCR (RSV, Flu A&B, Covid) Anterior Nasal Swab     Status: None   Collection Time: 03/09/23  7:23 AM   Specimen: Anterior Nasal Swab  Result Value Ref Range Status   SARS Coronavirus 2 by RT PCR NEGATIVE NEGATIVE Final   Influenza A by PCR NEGATIVE NEGATIVE Final   Influenza B by PCR NEGATIVE NEGATIVE Final    Comment: (NOTE) The Xpert Xpress SARS-CoV-2/FLU/RSV plus assay is intended as an aid in the diagnosis of influenza from Nasopharyngeal swab specimens and should not be used as a sole basis for treatment. Nasal washings and aspirates are unacceptable for Xpert Xpress SARS-CoV-2/FLU/RSV testing.  Fact Sheet for Patients: BloggerCourse.com  Fact Sheet for Healthcare Providers: SeriousBroker.it  This test is not yet approved or cleared by the [place] FDA and has been authorized for detection and/or diagnosis of SARS-CoV-2 by FDA under an Emergency Use Authorization (EUA). This EUA will remain in effect (meaning this test can be used) for the duration of the COVID-19 declaration under Section 564(b)(1) of the Act, 21 U.S.C. section 360bbb-3(b)(1), unless the authorization is terminated or revoked.     Resp Syncytial Virus by PCR NEGATIVE NEGATIVE Final    Comment: (NOTE) Fact Sheet for Patients: BloggerCourse.com  Fact Sheet for Healthcare Providers: SeriousBroker.it  This test is not yet approved or cleared by the [place] FDA and has been authorized for detection and/or diagnosis of SARS-CoV-2 by FDA under an Emergency Use Authorization (EUA). This EUA will remain in effect (meaning this test can be used) for the duration of the COVID-19 declaration under Section 564(b)(1) of the Act, 21 U.S.C. section 360bbb-3(b)(1), unless the authorization is terminated or revoked.  Performed at [facility], [address], [place], [place] [address]   Blood culture (routine x 2)     Status: Abnormal   Collection Time: 03/09/23  8:40 AM   Specimen: BLOOD LEFT ARM  Result Value Ref Range Status   Specimen Description BLOOD LEFT ARM  Final   Special Requests   Final    BOTTLES DRAWN AEROBIC AND ANAEROBIC Blood Culture adequate volume   Culture  Setup Time   Final    GRAM POSITIVE COCCI IN CLUSTERS ANAEROBIC BOTTLE ONLY CRITICAL RESULT CALLED TO, READ BACK BY AND VERIFIED WITH: PHARMD J. LEDFORD 03/10/23 @ 0606 BY AB    Culture (A)  Final    STAPHYLOCOCCUS HOMINIS  THE SIGNIFICANCE OF ISOLATING THIS ORGANISM FROM A SINGLE SET OF BLOOD CULTURES WHEN MULTIPLE SETS ARE DRAWN IS UNCERTAIN. PLEASE NOTIFY THE MICROBIOLOGY DEPARTMENT WITHIN ONE WEEK IF SPECIATION AND SENSITIVITIES ARE REQUIRED. Performed at [facility], [address] [place], [place] [address]    Report Status 03/12/2023 FINAL  Final  Blood Culture ID Panel (Reflexed)     Status: Abnormal   Collection Time: 03/09/23  8:40 AM  Result Value Ref Range Status   Enterococcus faecalis NOT DETECTED NOT DETECTED Final   Enterococcus Faecium NOT DETECTED NOT DETECTED Final   Listeria monocytogenes NOT DETECTED NOT DETECTED Final   Staphylococcus species DETECTED (A) NOT DETECTED Final    Comment: CRITICAL RESULT CALLED TO, READ BACK BY AND VERIFIED WITH: PHARMD J. LEDFORD 03/10/23 @ 0606 BY AB    Staphylococcus aureus (BCID) NOT DETECTED NOT DETECTED Final   Staphylococcus epidermidis DETECTED (A) NOT DETECTED Final    Comment: Methicillin (oxacillin) resistant coagulase negative staphylococcus. Possible blood culture contaminant (unless isolated from more than one blood culture draw or clinical case suggests pathogenicity). No antibiotic treatment is indicated for blood  culture contaminants. CRITICAL RESULT CALLED TO, READ BACK BY AND VERIFIED WITH: PHARMD J. LEDFORD 03/10/23 @ 0606 BY AB    Staphylococcus lugdunensis NOT DETECTED NOT  DETECTED Final   Streptococcus species NOT DETECTED NOT DETECTED Final   Streptococcus agalactiae NOT DETECTED NOT DETECTED Final   Streptococcus pneumoniae NOT DETECTED NOT DETECTED Final   Streptococcus pyogenes NOT DETECTED NOT DETECTED Final   A.calcoaceticus-baumannii NOT DETECTED NOT DETECTED Final   Bacteroides fragilis NOT DETECTED NOT DETECTED Final   Enterobacterales NOT DETECTED NOT DETECTED Final   Enterobacter cloacae complex NOT DETECTED NOT DETECTED Final   Escherichia coli NOT DETECTED NOT DETECTED Final   Klebsiella aerogenes NOT DETECTED NOT DETECTED Final   Klebsiella oxytoca NOT DETECTED NOT DETECTED Final   Klebsiella pneumoniae NOT DETECTED NOT DETECTED Final   Proteus species NOT DETECTED NOT DETECTED Final   Salmonella species NOT DETECTED NOT DETECTED Final   Serratia marcescens NOT DETECTED NOT DETECTED Final   Haemophilus influenzae NOT DETECTED NOT DETECTED Final   Neisseria meningitidis NOT DETECTED NOT DETECTED Final   Pseudomonas aeruginosa NOT DETECTED NOT DETECTED Final   Stenotrophomonas maltophilia NOT DETECTED NOT DETECTED Final   Candida albicans NOT DETECTED NOT DETECTED Final   Candida auris NOT DETECTED NOT DETECTED Final   Candida glabrata NOT DETECTED NOT DETECTED Final   Candida krusei NOT DETECTED NOT DETECTED Final   Candida parapsilosis NOT DETECTED NOT DETECTED Final   Candida tropicalis NOT DETECTED NOT DETECTED Final   Cryptococcus neoformans/gattii NOT DETECTED NOT DETECTED Final   Methicillin resistance mecA/C DETECTED (A) NOT DETECTED Final    Comment: CRITICAL RESULT CALLED TO, READ BACK BY AND VERIFIED WITH: PHARMD J. LEDFORD 03/10/23 @ 0606 BY AB Performed at [facility], [address] [place], [place] [address]   Blood culture (routine x 2)     Status: None (Preliminary result)   Collection Time: 03/09/23  8:41 AM   Specimen: BLOOD RIGHT ARM  Result Value Ref Range Status   Specimen Description BLOOD RIGHT ARM  Final    Special Requests   Final    BOTTLES DRAWN AEROBIC AND ANAEROBIC Blood Culture adequate volume   Culture   Final    NO GROWTH 4 DAYS Performed at [facility], [address], [place], [place] [address]    Report Status  PENDING  Incomplete  Culture, Respiratory w Gram Stain     Status: None   Collection Time: 03/09/23  3:46 PM   Specimen: Tracheal Aspirate  Result Value Ref Range Status   Specimen Description TRACHEAL ASPIRATE  Final   Special Requests NONE  Final   Gram Stain   Final    NO WBC SEEN FEW BUDDING YEAST SEEN Performed at [facility], [address], [place], [place] [address]    Culture FEW CANDIDA ALBICANS FEW CANDIDA GLABRATA   Final   Report Status 03/12/2023 FINAL  Final  MRSA Next Gen by PCR, Nasal     Status: None   Collection Time: 03/09/23  8:29 PM   Specimen: Nasal Mucosa; Nasal Swab  Result Value Ref Range Status   MRSA by PCR Next Gen NOT DETECTED NOT DETECTED Final    Comment: (NOTE) The GeneXpert MRSA Assay (FDA approved for NASAL specimens only), is one component of a comprehensive MRSA colonization surveillance program. It is not intended to diagnose MRSA infection nor to guide or monitor treatment for MRSA infections. Test performance is not FDA approved in patients less than 86 years old. Performed at [facility], [address], [place], [place] [address]      Radiology Studies: No results found.   Pamella Pert, MD, PhD [facility]  Between 7 am - 7 pm I am available, please contact me via Amion (for emergencies) or Securechat (non urgent messages)  Between 7 pm - 7 am I am not available, please contact night coverage MD/APP via Amion

## 2023-03-14 DIAGNOSIS — J189 Pneumonia, unspecified organism: Secondary | ICD-10-CM | POA: Diagnosis not present

## 2023-03-14 DIAGNOSIS — G9341 Metabolic encephalopathy: Secondary | ICD-10-CM | POA: Diagnosis not present

## 2023-03-14 DIAGNOSIS — A419 Sepsis, unspecified organism: Secondary | ICD-10-CM | POA: Diagnosis not present

## 2023-03-14 DIAGNOSIS — Z8679 Personal history of other diseases of the circulatory system: Secondary | ICD-10-CM | POA: Diagnosis not present

## 2023-03-14 LAB — COMPREHENSIVE METABOLIC PANEL
ALT: 55 U/L — ABNORMAL HIGH (ref 0–44)
AST: 42 U/L — ABNORMAL HIGH (ref 15–41)
Albumin: 2 g/dL — ABNORMAL LOW (ref 3.5–5.0)
Alkaline Phosphatase: 82 U/L (ref 38–126)
Anion gap: 14 (ref 5–15)
BUN: 17 mg/dL (ref 8–23)
CO2: 29 mmol/L (ref 22–32)
Calcium: 8.8 mg/dL — ABNORMAL LOW (ref 8.9–10.3)
Chloride: 100 mmol/L (ref 98–111)
Creatinine, Ser: 0.65 mg/dL (ref 0.44–1.00)
GFR, Estimated: >60 mL/min (ref >60)
Glucose, Bld: 187 mg/dL — ABNORMAL HIGH (ref 70–99)
Potassium: 2.6 mmol/L — CL (ref 3.5–5.1)
Sodium: 143 mmol/L (ref 135–145)
Total Bilirubin: 0.3 mg/dL (ref 0.3–1.2)
Total Protein: 6.3 g/dL — ABNORMAL LOW (ref 6.5–8.1)

## 2023-03-14 LAB — GLUCOSE, CAPILLARY
Glucose-Capillary: 146 mg/dL — ABNORMAL HIGH (ref 70–99)
Glucose-Capillary: 160 mg/dL — ABNORMAL HIGH (ref 70–99)
Glucose-Capillary: 167 mg/dL — ABNORMAL HIGH (ref 70–99)
Glucose-Capillary: 188 mg/dL — ABNORMAL HIGH (ref 70–99)
Glucose-Capillary: 200 mg/dL — ABNORMAL HIGH (ref 70–99)
Glucose-Capillary: 202 mg/dL — ABNORMAL HIGH (ref 70–99)

## 2023-03-14 LAB — CBC
HCT: 23.1 % — ABNORMAL LOW (ref 36.0–46.0)
Hemoglobin: 7.2 g/dL — ABNORMAL LOW (ref 12.0–15.0)
MCH: 30.3 pg (ref 26.0–34.0)
MCHC: 31.2 g/dL (ref 30.0–36.0)
MCV: 97.1 fL (ref 80.0–100.0)
Platelets: 279 10*3/uL (ref 150–400)
RBC: 2.38 MIL/uL — ABNORMAL LOW (ref 3.87–5.11)
RDW: 16.4 % — ABNORMAL HIGH (ref 11.5–15.5)
WBC: 14.2 10*3/uL — ABNORMAL HIGH (ref 4.0–10.5)
nRBC: 0 % (ref 0.0–0.2)

## 2023-03-14 LAB — CULTURE, BLOOD (ROUTINE X 2)
Culture: NO GROWTH
Special Requests: ADEQUATE

## 2023-03-14 LAB — MAGNESIUM: Magnesium: 1.9 mg/dL (ref 1.7–2.4)

## 2023-03-14 MED ORDER — GLUCERNA 1.5 CAL PO LIQD
1000.0000 mL | ORAL | Status: DC
  Administered 2023-03-14 – 2023-03-15 (×2): 1000 mL
  Filled 2023-03-14 (×2): qty 1000

## 2023-03-14 MED ORDER — POTASSIUM CHLORIDE 20 MEQ PO PACK
40.0000 meq | PACK | Freq: Once | ORAL | Status: AC
  Administered 2023-03-14: 40 meq
  Filled 2023-03-14: qty 2

## 2023-03-14 MED ORDER — POTASSIUM CHLORIDE 10 MEQ/100ML IV SOLN
10.0000 meq | INTRAVENOUS | Status: AC
  Administered 2023-03-14 (×3): 10 meq via INTRAVENOUS
  Filled 2023-03-14 (×3): qty 100

## 2023-03-14 MED ORDER — POTASSIUM CHLORIDE 10 MEQ/100ML IV SOLN
INTRAVENOUS | Status: AC
  Administered 2023-03-14: 10 meq
  Filled 2023-03-14: qty 100

## 2023-03-14 NOTE — Progress Notes (Signed)
PROGRESS NOTE  Gina Kelley VOZ:[id number] DOB: 12/06/1935 DOA: 03/09/2023 PCP: Patient, No Pcp Per   LOS: 5 days   Brief Narrative / Interim history: 87 year old female with HTN, DM2, large spontaneous subdural hematoma and intracerebral hemorrhage January 2024 requiring craniotomy, history of left-sided hemiparesis dysphagia with PEG tube, chronic Foley catheter, chronic hypoxia on 5 L bedbound status, living in a facility comes into the hospital with hypoxia.  She was recently hospitalized 9/1-9/4 with encephalopathy due to UTI.  Subjective / 24h Interval events: Alert, comfortable  Assesement and Plan: Principal Problem:   Sepsis due to pneumonia [facility]) Active Problems:   Chronic respiratory failure with hypoxia (HCC)   Elevated troponin   S/P craniotomy   History of intracranial hemorrhage   Dysphagia   Acute metabolic encephalopathy   Normocytic anemia   Essential hypertension   Type 2 diabetes mellitus (HCC)   Transaminitis   Seizure disorder (HCC)   Hyperlipidemia   Principal problem Sepsis due to aspiration pneumonia, acute on chronic hypoxic respiratory failure -likely due to recurrent aspiration.  Has remained stable, she is alert, feeds resumed on 9/12 at 10 cc/h, no further aspiration events, can increase to 20 cc/h.  Closely monitor -Continue antibiotics, de-escalated to Augmentin -Palliative consulted to discuss goals of care given chronic medical problems, recurrent aspiration, recurrent hospitalizations, bedbound status, [name] are ongoing, but for now remains full code/full scope.  Discussed with palliative NP yesterday  Active problems 1/4 bottles staph epi -likely contaminant  Acute metabolic encephalopathy-due to #1.  Remains alert and appears better  Troponin elevation-likely in the setting of sepsis, demand ischemia  History of ICH/SDH s/p craniotomy, chronic dysphagia -she is more alert today, tolerating feeds, continue to increase rate, goal 40  cc/h  Normocytic anemia- She has been evaluated by GI in the past and was  thought not to be a candidate for endoscopy.  Follow trend   Essential hypertension -Continue Coreg  Hypokalemia-low again today, continue to replace.  Magnesium normal  Leukocytosis-persistent, due to recurrent aspiration  Diabetes mellitus type 2, with long-term use of insulin -Hold metformin. SSI  Lab Results  Component Value Date   HGBA1C 5.8 (H) 02/14/2023   CBG (last 3)  Recent Labs    03/13/23 1944 03/14/23 0017 03/14/23 0620  GLUCAP 184* 167* 188*   Transaminitis - Acute on chronic.  AST 209 and ALT 212 acutely elevated from prior with lipase elevated as well.  CT did not note any acute intra-abdominal process.  Improving   History of bladder outlet obstruction -Monitor postvoid residuals    Seizure disorder -Continue Keppra IV   Hyperlipidemia -Held atorvastatin due to acute liver dysfunction   Depression -Continue Prozac   GERD -Continue Protonix  Scheduled Meds:  amoxicillin-clavulanate  800 mg Oral Q12H   carvedilol  6.25 mg Per Tube BID WC   ferrous sulfate  300 mg Per Tube Daily   FLUoxetine  20 mg Per Tube QHS   gabapentin  100 mg Per Tube Q8H   guaiFENesin  10 mL Oral BID   insulin aspart  0-15 Units Subcutaneous Q6H   lidocaine  1 patch Transdermal Q24H   multivitamin with minerals  1 tablet Per Tube Daily   pantoprazole (PROTONIX) IV  40 mg Intravenous Q24H   sodium chloride flush  3 mL Intravenous Q12H   Continuous Infusions:  sodium chloride Stopped (03/10/23 1116)   feeding supplement (GLUCERNA 1.5 CAL) 20 mL/hr at 03/13/23 1656   levETIRAcetam 500 mg (03/13/23 2108)  potassium chloride 10 mEq (03/14/23 0630)   PRN Meds:.sodium chloride, acetaminophen, albuterol, hydrALAZINE, influenza vaccine adjuvanted, mouth rinse  Current Outpatient Medications  Medication Instructions   acetaminophen (TYLENOL) 500 mg, Per Tube, 4 times daily PRN   acetaminophen (TYLENOL)  1,000 mg, Per Tube, 2 times daily   amLODipine (NORVASC) 10 mg, Per Tube, Daily   ascorbic acid (VITAMIN C) 500 mg, Per Tube, Daily   atorvastatin (LIPITOR) 80 mg, Per Tube, Daily   carvedilol (COREG) 6.25 mg, Per Tube, 2 times daily with meals   ezetimibe (ZETIA) 10 mg, Per Tube, Daily   famotidine (PEPCID) 20 mg, Per Tube, 2 times daily   ferrous sulfate 408 mg, Per Tube, Daily   FLUoxetine (PROZAC) 20 mg, Per Tube, Daily at bedtime   gabapentin (NEURONTIN) 100 mg, Per Tube, Every 8 hours   guaiFENesin (ROBITUSSIN) 200 mg, Per Tube, 4 times daily   insulin lispro (HUMALOG) 0-16 Units, Subcutaneous, See admin instructions, Inject 0-15 units 4 times daily per sliding scale:<BR>< 69 : implement hypoglycemic protocol<BR>70-150 : 0 units<BR>151-200 : 3 units<BR>201-250 : 6 units<BR>251-300 : 9 units<BR>301-350 : 12 units<BR>351-400 : 16 units<BR>> 401 : call MD   ipratropium-albuterol (DUONEB) 0.5-2.5 (3) MG/3ML SOLN 3 mLs, Nebulization, Every 4 hours PRN   l-methylfolate-B6-B12 (METANX) 3-35-2 MG TABS tablet 1 tablet, Per Tube, Daily   levETIRAcetam (KEPPRA) 500 mg, Per Tube, 2 times daily   lidocaine 4 % 1 patch, Transdermal, Every 24 hours   metFORMIN (GLUCOPHAGE) 500 mg, Per Tube, 2 times daily with meals   Multiple Vitamins-Minerals (PRESERVISION AREDS 2) CHEW 1 each, Tube, 2 times daily   Nutritional Supplements (FEEDING SUPPLEMENT, GLUCERNA 1.5 CAL,) LIQD 1,000 mLs, Per Tube, Every 24 hours   ondansetron (ZOFRAN) 4 mg, Oral, Every 8 hours PRN   pantoprazole (PROTONIX) 40 mg, Per Tube, Daily   Water For Irrigation, Sterile (FREE WATER) SOLN 120 mLs, Per Tube, Every 4 hours    Diet Orders (From admission, onward)     Start     Ordered   03/09/23 1406  Diet NPO time specified Except for: Other (See Comments)  Diet effective now       Comments: Meds thur tube  Question:  Except for  Answer:  Other (See Comments)   03/09/23 1406            DVT prophylaxis: SCDs Start: 03/09/23  1317   Lab Results  Component Value Date   PLT 279 03/14/2023      Code Status: Full Code  Family Communication: no family at bedside   Status is: Inpatient Remains inpatient appropriate because: severity of illness  Level of care: Progressive  Consultants:  Palliative care  Objective: Vitals:   03/14/23 0054 03/14/23 0451 03/14/23 0500 03/14/23 0751  BP: 137/70 (!) 142/65    Pulse: (!) 111 76  83  Resp: 20 16    Temp: 98.5 F (36.9 C) 98.1 F (36.7 C)  98.1 F (36.7 C)  TempSrc: Oral Axillary  Axillary  SpO2: 98% 100%  100%  Weight:   55.4 kg   Height:        Intake/Output Summary (Last 24 hours) at 03/14/2023 0914 Last data filed at 03/14/2023 0802 Gross per 24 hour  Intake 824.03 ml  Output 775 ml  Net 49.03 ml   Wt Readings from Last 3 Encounters:  03/14/23 55.4 kg  03/02/23 53.1 kg  02/18/23 56.9 kg    Examination:  Constitutional: NAD Eyes: lids and conjunctivae normal, no  scleral icterus ENMT: mmm Neck: normal, supple Respiratory: Faint rhonchi, no wheezing, no crackles Cardiovascular: Regular rate and rhythm, no murmurs / rubs / gallops. No LE edema. Abdomen: soft, no distention, no tenderness. Bowel sounds positive.   Data Reviewed: I have independently reviewed following labs and imaging studies   CBC Recent Labs  Lab 03/09/23 0748 03/09/23 1012 03/10/23 0854 03/11/23 0431 03/12/23 0411 03/13/23 1207 03/14/23 0336  WBC 19.6*  --  29.3* 30.6* 22.3* 12.9* 14.2*  HGB 9.5*   < > 8.5* 7.6* 7.7* 8.0* 7.2*  HCT 30.7*   < > 25.8* 24.1* 24.1* 26.0* 23.1*  PLT 461*  --  326 346 302 295 279  MCV 98.4  --  98.1 98.8 100.0 98.9 97.1  MCH 30.4  --  32.3 31.1 32.0 30.4 30.3  MCHC 30.9  --  32.9 31.5 32.0 30.8 31.2  RDW 16.6*  --  17.2* 17.2* 17.1* 16.5* 16.4*  LYMPHSABS 0.4*  --   --   --   --   --   --   MONOABS 1.0  --   --   --   --   --   --   EOSABS 0.0  --   --   --   --   --   --   BASOSABS 0.0  --   --   --   --   --   --    < > =  values in this interval not displayed.    Recent Labs  Lab 03/09/23 0748 03/09/23 0849 03/09/23 1008 03/09/23 1012 03/09/23 1103 03/09/23 1606 03/10/23 0854 03/10/23 0958 03/10/23 1104 03/11/23 0431 03/12/23 0411 03/13/23 1207 03/14/23 0336  NA 133*  --   --    < >  --   --   --  139  --  140 142 144 143  K 4.0  --   --    < >  --   --   --  3.8  --  3.3* 3.4* 3.0* 2.6*  CL 97*  --   --   --   --   --   --  104  --  104 105 103 100  CO2 25  --   --   --   --   --   --  28  --  25 26 29 29   GLUCOSE 268*  --   --   --   --   --   --  142*  --  130* 99 137* 187*  BUN 28*  --   --   --   --   --   --  25*  --  16 12 15 17   CREATININE 0.94  --   --   --   --   --   --  0.74  --  0.64 0.58 0.63 0.65  CALCIUM 8.9  --   --   --   --   --   --  9.0  --  8.9 8.9 9.1 8.8*  AST 209*  --   --   --   --   --   --  112*  --   --  67*  --  42*  ALT 212*  --   --   --   --   --   --  137*  --   --  84*  --  55*  ALKPHOS 123  --   --   --   --   --   --  83  --   --  79  --  82  BILITOT 0.4  --   --   --   --   --   --  0.4  --   --  0.5  --  0.3  ALBUMIN 2.7*  --   --   --   --   --   --  2.4*  --   --  2.2*  --  2.0*  MG  --   --   --   --   --   --   --   --   --   --  1.7  --  1.9  LATICACIDVEN  --    < > 4.0*  --  3.6* 3.0* 1.9  --  2.1*  --   --   --   --   BNP 235.4*  --   --   --   --   --   --   --   --   --   --   --   --    < > = values in this interval not displayed.    ------------------------------------------------------------------------------------------------------------------ No results for input(s): "CHOL", "HDL", "LDLCALC", "TRIG", "CHOLHDL", "LDLDIRECT" in the last 72 hours.  Lab Results  Component Value Date   HGBA1C 5.8 (H) 02/14/2023   ------------------------------------------------------------------------------------------------------------------ No results for input(s): "TSH", "T4TOTAL", "T3FREE", "THYROIDAB" in the last 72 hours.  Invalid input(s):  "FREET3"  Cardiac Enzymes No results for input(s): "CKMB", "TROPONINI", "MYOGLOBIN" in the last 168 hours.  Invalid input(s): "CK" ------------------------------------------------------------------------------------------------------------------    Component Value Date/Time   BNP 235.4 (H) 03/09/2023 0748    CBG: Recent Labs  Lab 03/13/23 1145 03/13/23 1638 03/13/23 1944 03/14/23 0017 03/14/23 0620  GLUCAP 137* 169* 184* 167* 188*    Recent Results (from the past 240 hour(s))  Resp panel by RT-PCR (RSV, Flu A&B, Covid) Anterior Nasal Swab     Status: None   Collection Time: 03/09/23  7:23 AM   Specimen: Anterior Nasal Swab  Result Value Ref Range Status   SARS Coronavirus 2 by RT PCR NEGATIVE NEGATIVE Final   Influenza A by PCR NEGATIVE NEGATIVE Final   Influenza B by PCR NEGATIVE NEGATIVE Final    Comment: (NOTE) The Xpert Xpress SARS-CoV-2/FLU/RSV plus assay is intended as an aid in the diagnosis of influenza from Nasopharyngeal swab specimens and should not be used as a sole basis for treatment. Nasal washings and aspirates are unacceptable for Xpert Xpress SARS-CoV-2/FLU/RSV testing.  Fact Sheet for Patients: BloggerCourse.com  Fact Sheet for Healthcare Providers: SeriousBroker.it  This test is not yet approved or cleared by the [place] FDA and has been authorized for detection and/or diagnosis of SARS-CoV-2 by FDA under an Emergency Use Authorization (EUA). This EUA will remain in effect (meaning this test can be used) for the duration of the COVID-19 declaration under Section 564(b)(1) of the Act, 21 U.S.C. section 360bbb-3(b)(1), unless the authorization is terminated or revoked.     Resp Syncytial Virus by PCR NEGATIVE NEGATIVE Final    Comment: (NOTE) Fact Sheet for Patients: BloggerCourse.com  Fact Sheet for Healthcare  Providers: SeriousBroker.it  This test is not yet approved or cleared by the [place] FDA and has been authorized for detection and/or diagnosis of SARS-CoV-2 by FDA under an Emergency Use Authorization (EUA). This EUA will remain in effect (meaning this test can be used) for the duration of the COVID-19 declaration under Section 564(b)(1) of the Act,  21 U.S.C. section 360bbb-3(b)(1), unless the authorization is terminated or revoked.  Performed at [facility], [address], [place], [place] [address]   Blood culture (routine x 2)     Status: Abnormal   Collection Time: 03/09/23  8:40 AM   Specimen: BLOOD LEFT ARM  Result Value Ref Range Status   Specimen Description BLOOD LEFT ARM  Final   Special Requests   Final    BOTTLES DRAWN AEROBIC AND ANAEROBIC Blood Culture adequate volume   Culture  Setup Time   Final    GRAM POSITIVE COCCI IN CLUSTERS ANAEROBIC BOTTLE ONLY CRITICAL RESULT CALLED TO, READ BACK BY AND VERIFIED WITH: PHARMD J. LEDFORD 03/10/23 @ 0606 BY AB    Culture (A)  Final    STAPHYLOCOCCUS HOMINIS THE SIGNIFICANCE OF ISOLATING THIS ORGANISM FROM A SINGLE SET OF BLOOD CULTURES WHEN MULTIPLE SETS ARE DRAWN IS UNCERTAIN. PLEASE NOTIFY THE MICROBIOLOGY DEPARTMENT WITHIN ONE WEEK IF SPECIATION AND SENSITIVITIES ARE REQUIRED. Performed at [facility], [address] [place], [place] [address]    Report Status 03/12/2023 FINAL  Final  Blood Culture ID Panel (Reflexed)     Status: Abnormal   Collection Time: 03/09/23  8:40 AM  Result Value Ref Range Status   Enterococcus faecalis NOT DETECTED NOT DETECTED Final   Enterococcus Faecium NOT DETECTED NOT DETECTED Final   Listeria monocytogenes NOT DETECTED NOT DETECTED Final   Staphylococcus species DETECTED (A) NOT DETECTED Final    Comment: CRITICAL RESULT CALLED TO, READ BACK BY AND VERIFIED WITH: PHARMD J. LEDFORD 03/10/23 @ 0606 BY AB    Staphylococcus aureus (BCID) NOT  DETECTED NOT DETECTED Final   Staphylococcus epidermidis DETECTED (A) NOT DETECTED Final    Comment: Methicillin (oxacillin) resistant coagulase negative staphylococcus. Possible blood culture contaminant (unless isolated from more than one blood culture draw or clinical case suggests pathogenicity). No antibiotic treatment is indicated for blood  culture contaminants. CRITICAL RESULT CALLED TO, READ BACK BY AND VERIFIED WITH: PHARMD J. LEDFORD 03/10/23 @ 0606 BY AB    Staphylococcus lugdunensis NOT DETECTED NOT DETECTED Final   Streptococcus species NOT DETECTED NOT DETECTED Final   Streptococcus agalactiae NOT DETECTED NOT DETECTED Final   Streptococcus pneumoniae NOT DETECTED NOT DETECTED Final   Streptococcus pyogenes NOT DETECTED NOT DETECTED Final   A.calcoaceticus-baumannii NOT DETECTED NOT DETECTED Final   Bacteroides fragilis NOT DETECTED NOT DETECTED Final   Enterobacterales NOT DETECTED NOT DETECTED Final   Enterobacter cloacae complex NOT DETECTED NOT DETECTED Final   Escherichia coli NOT DETECTED NOT DETECTED Final   Klebsiella aerogenes NOT DETECTED NOT DETECTED Final   Klebsiella oxytoca NOT DETECTED NOT DETECTED Final   Klebsiella pneumoniae NOT DETECTED NOT DETECTED Final   Proteus species NOT DETECTED NOT DETECTED Final   Salmonella species NOT DETECTED NOT DETECTED Final   Serratia marcescens NOT DETECTED NOT DETECTED Final   Haemophilus influenzae NOT DETECTED NOT DETECTED Final   Neisseria meningitidis NOT DETECTED NOT DETECTED Final   Pseudomonas aeruginosa NOT DETECTED NOT DETECTED Final   Stenotrophomonas maltophilia NOT DETECTED NOT DETECTED Final   Candida albicans NOT DETECTED NOT DETECTED Final   Candida auris NOT DETECTED NOT DETECTED Final   Candida glabrata NOT DETECTED NOT DETECTED Final   Candida krusei NOT DETECTED NOT DETECTED Final   Candida parapsilosis NOT DETECTED NOT DETECTED Final   Candida tropicalis NOT DETECTED NOT DETECTED Final    Cryptococcus neoformans/gattii NOT DETECTED NOT DETECTED Final   Methicillin  resistance mecA/C DETECTED (A) NOT DETECTED Final    Comment: CRITICAL RESULT CALLED TO, READ BACK BY AND VERIFIED WITH: PHARMD J. LEDFORD 03/10/23 @ 0606 BY AB Performed at [facility], [address], [place], [place] [address]   Blood culture (routine x 2)     Status: None   Collection Time: 03/09/23  8:41 AM   Specimen: BLOOD RIGHT ARM  Result Value Ref Range Status   Specimen Description BLOOD RIGHT ARM  Final   Special Requests   Final    BOTTLES DRAWN AEROBIC AND ANAEROBIC Blood Culture adequate volume   Culture   Final    NO GROWTH 5 DAYS Performed at [facility], [address], [place], [place] [address]    Report Status 03/14/2023 FINAL  Final  Culture, Respiratory w Gram Stain     Status: None   Collection Time: 03/09/23  3:46 PM   Specimen: Tracheal Aspirate  Result Value Ref Range Status   Specimen Description TRACHEAL ASPIRATE  Final   Special Requests NONE  Final   Gram Stain   Final    NO WBC SEEN FEW BUDDING YEAST SEEN Performed at [facility], [address], [place], [place] [address]    Culture FEW CANDIDA ALBICANS FEW CANDIDA GLABRATA   Final   Report Status 03/12/2023 FINAL  Final  MRSA Next Gen by PCR, Nasal     Status: None   Collection Time: 03/09/23  8:29 PM   Specimen: Nasal Mucosa; Nasal Swab  Result Value Ref Range Status   MRSA by PCR Next Gen NOT DETECTED NOT DETECTED Final    Comment: (NOTE) The GeneXpert MRSA Assay (FDA approved for NASAL specimens only), is one component of a comprehensive MRSA colonization surveillance program. It is not intended to diagnose MRSA infection nor to guide or monitor treatment for MRSA infections. Test performance is not FDA approved in patients less than 34 years old. Performed at [facility], [address] [place], [place] [address]      Radiology Studies: No results found.   Pamella Pert,  MD, PhD [facility]  Between 7 am - 7 pm I am available, please contact me via Amion (for emergencies) or Securechat (non urgent messages)  Between 7 pm - 7 am I am not available, please contact night coverage MD/APP via Amion

## 2023-03-14 NOTE — Plan of Care (Signed)
Problem: Clinical Measurements: Goal: Signs and symptoms of infection will decrease Outcome: Progressing

## 2023-03-15 DIAGNOSIS — Z8679 Personal history of other diseases of the circulatory system: Secondary | ICD-10-CM | POA: Diagnosis not present

## 2023-03-15 DIAGNOSIS — A419 Sepsis, unspecified organism: Secondary | ICD-10-CM | POA: Diagnosis not present

## 2023-03-15 DIAGNOSIS — G9341 Metabolic encephalopathy: Secondary | ICD-10-CM | POA: Diagnosis not present

## 2023-03-15 DIAGNOSIS — J189 Pneumonia, unspecified organism: Secondary | ICD-10-CM | POA: Diagnosis not present

## 2023-03-15 LAB — BASIC METABOLIC PANEL
Anion gap: 5 (ref 5–15)
BUN: 18 mg/dL (ref 8–23)
CO2: 33 mmol/L — ABNORMAL HIGH (ref 22–32)
Calcium: 8.7 mg/dL — ABNORMAL LOW (ref 8.9–10.3)
Chloride: 103 mmol/L (ref 98–111)
Creatinine, Ser: 0.66 mg/dL (ref 0.44–1.00)
GFR, Estimated: >60 mL/min (ref >60)
Glucose, Bld: 209 mg/dL — ABNORMAL HIGH (ref 70–99)
Potassium: 3.9 mmol/L (ref 3.5–5.1)
Sodium: 141 mmol/L (ref 135–145)

## 2023-03-15 LAB — CBC
HCT: 23.2 % — ABNORMAL LOW (ref 36.0–46.0)
Hemoglobin: 7.1 g/dL — ABNORMAL LOW (ref 12.0–15.0)
MCH: 30.3 pg (ref 26.0–34.0)
MCHC: 30.6 g/dL (ref 30.0–36.0)
MCV: 99.1 fL (ref 80.0–100.0)
Platelets: 267 10*3/uL (ref 150–400)
RBC: 2.34 MIL/uL — ABNORMAL LOW (ref 3.87–5.11)
RDW: 16.8 % — ABNORMAL HIGH (ref 11.5–15.5)
WBC: 8.8 10*3/uL (ref 4.0–10.5)
nRBC: 0 % (ref 0.0–0.2)

## 2023-03-15 LAB — GLUCOSE, CAPILLARY
Glucose-Capillary: 209 mg/dL — ABNORMAL HIGH (ref 70–99)
Glucose-Capillary: 213 mg/dL — ABNORMAL HIGH (ref 70–99)
Glucose-Capillary: 222 mg/dL — ABNORMAL HIGH (ref 70–99)

## 2023-03-15 LAB — MAGNESIUM: Magnesium: 2.2 mg/dL (ref 1.7–2.4)

## 2023-03-15 MED ORDER — GLUCERNA 1.5 CAL PO LIQD
1000.0000 mL | ORAL | Status: DC
  Administered 2023-03-16: 1000 mL
  Filled 2023-03-15 (×2): qty 1000

## 2023-03-15 MED ORDER — ORAL CARE MOUTH RINSE: 15.0000 mL | OROMUCOSAL | Status: DC | PRN

## 2023-03-15 MED ORDER — ORAL CARE MOUTH RINSE
15.0000 mL | OROMUCOSAL | Status: DC
  Administered 2023-03-15 – 2023-03-16 (×5): 15 mL via OROMUCOSAL

## 2023-03-15 NOTE — Plan of Care (Signed)
  Problem: Fluid Volume: Goal: Hemodynamic stability will improve Outcome: Progressing   Problem: Clinical Measurements: Goal: Diagnostic test results will improve Outcome: Progressing Goal: Signs and symptoms of infection will decrease Outcome: Progressing   Problem: Respiratory: Goal: Ability to maintain adequate ventilation will improve Outcome: Progressing   Problem: Clinical Measurements: Goal: Usual level of consciousness will be regained or maintained. Outcome: Progressing Goal: Neurologic status will improve Outcome: Progressing Goal: Ability to maintain intracranial pressure will improve Outcome: Progressing   Problem: Skin Integrity: Goal: Demonstration of wound healing without infection will improve Outcome: Progressing

## 2023-03-15 NOTE — Progress Notes (Signed)
PROGRESS NOTE  BRICHELLE DARRIN WUJ:[id number] DOB: 09-18-1935 DOA: 03/09/2023 PCP: Patient, No Pcp Per   LOS: 6 days   Brief Narrative / Interim history: 87 year old female with HTN, DM2, large spontaneous subdural hematoma and intracerebral hemorrhage January 2024 requiring craniotomy, history of left-sided hemiparesis dysphagia with PEG tube, chronic Foley catheter, chronic hypoxia on 5 L bedbound status, living in a facility comes into the hospital with hypoxia.  She was recently hospitalized 9/1-9/4 with encephalopathy due to UTI.  Subjective / 24h Interval events: Alert, comfortable.  No apparent complaints  Assesement and Plan: Principal Problem:   Sepsis due to pneumonia [facility] Active Problems:   Chronic respiratory failure with hypoxia (HCC)   Elevated troponin   S/P craniotomy   History of intracranial hemorrhage   Dysphagia   Acute metabolic encephalopathy   Normocytic anemia   Essential hypertension   Type 2 diabetes mellitus (HCC)   Transaminitis   Seizure disorder (HCC)   Hyperlipidemia   Principal problem Sepsis due to aspiration pneumonia, acute on chronic hypoxic respiratory failure -likely due to recurrent aspiration.  Has remained stable, she is alert, feeds resumed on 9/12 at 10 cc/h, no further aspiration events although it is a matter of time, increased to goal 40 cc/day today -Continue antibiotics, switched to Augmentin 9/14. -Palliative consulted to discuss goals of care given chronic medical problems, recurrent aspiration, recurrent hospitalizations, bedbound status, [name] are ongoing, but for now remains full code/full scope.  Discussed with palliative NP  Active problems 1/4 bottles staph epi -likely contaminant  Acute metabolic encephalopathy-due to #1.  Remains alert and appears better  Troponin elevation-likely in the setting of sepsis, demand ischemia  History of ICH/SDH s/p craniotomy, chronic dysphagia -she is more alert today, tolerating feeds,  continue to increase rate, goal 40 cc/h  Normocytic anemia- She has been evaluated by GI in the past and was  thought not to be a candidate for endoscopy.  Follow trend.  Transfuse for hemoglobin less than 7   Essential hypertension -Continue Coreg  Hypokalemia-low again today, continue to replace.  Magnesium normal  Leukocytosis-persistent, due to recurrent aspiration  Diabetes mellitus type 2, with long-term use of insulin -Hold metformin. SSI  Lab Results  Component Value Date   HGBA1C 5.8 (H) 02/14/2023   CBG (last 3)  Recent Labs    03/14/23 2117 03/14/23 2327 03/15/23 0603  GLUCAP 160* 200* 209*   Transaminitis - Acute on chronic.  AST 209 and ALT 212 acutely elevated from prior with lipase elevated as well.  CT did not note any acute intra-abdominal process.  Improving   History of bladder outlet obstruction -Monitor postvoid residuals    Seizure disorder -Continue Keppra IV   Hyperlipidemia -Held atorvastatin due to acute liver dysfunction   Depression -Continue Prozac   GERD -Continue Protonix  Scheduled Meds:  amoxicillin-clavulanate  800 mg Oral Q12H   carvedilol  6.25 mg Per Tube BID WC   ferrous sulfate  300 mg Per Tube Daily   FLUoxetine  20 mg Per Tube QHS   gabapentin  100 mg Per Tube Q8H   guaiFENesin  10 mL Oral BID   insulin aspart  0-15 Units Subcutaneous Q6H   lidocaine  1 patch Transdermal Q24H   multivitamin with minerals  1 tablet Per Tube Daily   mouth rinse  15 mL Mouth Rinse 4 times per day   pantoprazole (PROTONIX) IV  40 mg Intravenous Q24H   sodium chloride flush  3 mL Intravenous Q12H  Continuous Infusions:  sodium chloride Stopped (03/10/23 1116)   feeding supplement (GLUCERNA 1.5 CAL)     levETIRAcetam 500 mg (03/15/23 0853)   PRN Meds:.sodium chloride, acetaminophen, albuterol, hydrALAZINE, influenza vaccine adjuvanted, mouth rinse, mouth rinse  Current Outpatient Medications  Medication Instructions   acetaminophen  (TYLENOL) 500 mg, Per Tube, 4 times daily PRN   acetaminophen (TYLENOL) 1,000 mg, Per Tube, 2 times daily   amLODipine (NORVASC) 10 mg, Per Tube, Daily   ascorbic acid (VITAMIN C) 500 mg, Per Tube, Daily   atorvastatin (LIPITOR) 80 mg, Per Tube, Daily   carvedilol (COREG) 6.25 mg, Per Tube, 2 times daily with meals   ezetimibe (ZETIA) 10 mg, Per Tube, Daily   famotidine (PEPCID) 20 mg, Per Tube, 2 times daily   ferrous sulfate 408 mg, Per Tube, Daily   FLUoxetine (PROZAC) 20 mg, Per Tube, Daily at bedtime   gabapentin (NEURONTIN) 100 mg, Per Tube, Every 8 hours   guaiFENesin (ROBITUSSIN) 200 mg, Per Tube, 4 times daily   insulin lispro (HUMALOG) 0-16 Units, Subcutaneous, See admin instructions, Inject 0-15 units 4 times daily per sliding scale:<BR>< 69 : implement hypoglycemic protocol<BR>70-150 : 0 units<BR>151-200 : 3 units<BR>201-250 : 6 units<BR>251-300 : 9 units<BR>301-350 : 12 units<BR>351-400 : 16 units<BR>> 401 : call MD   ipratropium-albuterol (DUONEB) 0.5-2.5 (3) MG/3ML SOLN 3 mLs, Nebulization, Every 4 hours PRN   l-methylfolate-B6-B12 (METANX) 3-35-2 MG TABS tablet 1 tablet, Per Tube, Daily   levETIRAcetam (KEPPRA) 500 mg, Per Tube, 2 times daily   lidocaine 4 % 1 patch, Transdermal, Every 24 hours   metFORMIN (GLUCOPHAGE) 500 mg, Per Tube, 2 times daily with meals   Multiple Vitamins-Minerals (PRESERVISION AREDS 2) CHEW 1 each, Tube, 2 times daily   Nutritional Supplements (FEEDING SUPPLEMENT, GLUCERNA 1.5 CAL,) LIQD 1,000 mLs, Per Tube, Every 24 hours   ondansetron (ZOFRAN) 4 mg, Oral, Every 8 hours PRN   pantoprazole (PROTONIX) 40 mg, Per Tube, Daily   Water For Irrigation, Sterile (FREE WATER) SOLN 120 mLs, Per Tube, Every 4 hours    Diet Orders (From admission, onward)     Start     Ordered   03/09/23 1406  Diet NPO time specified Except for: Other (See Comments)  Diet effective now       Comments: Meds thur tube  Question:  Except for  Answer:  Other (See Comments)    03/09/23 1406            DVT prophylaxis: SCDs Start: 03/09/23 1317   Lab Results  Component Value Date   PLT 267 03/15/2023      Code Status: Full Code  Family Communication: no family at bedside   Status is: Inpatient Remains inpatient appropriate because: severity of illness  Level of care: Progressive  Consultants:  Palliative care  Objective: Vitals:   03/14/23 2325 03/15/23 0341 03/15/23 0440 03/15/23 0827  BP: (!) 153/61 (!) 157/70  (!) 151/70  Pulse: 81 79  81  Resp: 16 16  18   Temp: 99 F (37.2 C) 98.7 F (37.1 C)  98.9 F (37.2 C)  TempSrc: Oral Oral  Oral  SpO2: 99% 100%  100%  Weight:   53.5 kg   Height:        Intake/Output Summary (Last 24 hours) at 03/15/2023 1013 Last data filed at 03/15/2023 0700 Gross per 24 hour  Intake 1726.5 ml  Output --  Net 1726.5 ml   Wt Readings from Last 3 Encounters:  03/15/23 53.5 kg  03/02/23 53.1 kg  02/18/23 56.9 kg    Examination:  Constitutional: NAD Eyes: lids and conjunctivae normal, no scleral icterus ENMT: mmm Neck: normal, supple Respiratory: clear to auscultation bilaterally, no wheezing, no crackles. Normal respiratory effort.  Cardiovascular: Regular rate and rhythm, no murmurs / rubs / gallops. No LE edema. Abdomen: soft, no distention, no tenderness. Bowel sounds positive.   Data Reviewed: I have independently reviewed following labs and imaging studies   CBC Recent Labs  Lab 03/09/23 0748 03/09/23 1012 03/11/23 0431 03/12/23 0411 03/13/23 1207 03/14/23 0336 03/15/23 0433  WBC 19.6*   < > 30.6* 22.3* 12.9* 14.2* 8.8  HGB 9.5*   < > 7.6* 7.7* 8.0* 7.2* 7.1*  HCT 30.7*   < > 24.1* 24.1* 26.0* 23.1* 23.2*  PLT 461*   < > 346 302 295 279 267  MCV 98.4   < > 98.8 100.0 98.9 97.1 99.1  MCH 30.4   < > 31.1 32.0 30.4 30.3 30.3  MCHC 30.9   < > 31.5 32.0 30.8 31.2 30.6  RDW 16.6*   < > 17.2* 17.1* 16.5* 16.4* 16.8*  LYMPHSABS 0.4*  --   --   --   --   --   --   MONOABS 1.0  --    --   --   --   --   --   EOSABS 0.0  --   --   --   --   --   --   BASOSABS 0.0  --   --   --   --   --   --    < > = values in this interval not displayed.    Recent Labs  Lab 03/09/23 0748 03/09/23 0849 03/09/23 1008 03/09/23 1012 03/09/23 1103 03/09/23 1606 03/10/23 0854 03/10/23 0958 03/10/23 1104 03/11/23 0431 03/12/23 0411 03/13/23 1207 03/14/23 0336 03/15/23 0433  NA 133*  --   --    < >  --   --   --  139  --  140 142 144 143 141  K 4.0  --   --    < >  --   --   --  3.8  --  3.3* 3.4* 3.0* 2.6* 3.9  CL 97*  --   --   --   --   --   --  104  --  104 105 103 100 103  CO2 25  --   --   --   --   --   --  28  --  25 26 29 29  33*  GLUCOSE 268*  --   --   --   --   --   --  142*  --  130* 99 137* 187* 209*  BUN 28*  --   --   --   --   --   --  25*  --  16 12 15 17 18   CREATININE 0.94  --   --   --   --   --   --  0.74  --  0.64 0.58 0.63 0.65 0.66  CALCIUM 8.9  --   --   --   --   --   --  9.0  --  8.9 8.9 9.1 8.8* 8.7*  AST 209*  --   --   --   --   --   --  112*  --   --  67*  --  42*  --  ALT 212*  --   --   --   --   --   --  137*  --   --  84*  --  55*  --   ALKPHOS 123  --   --   --   --   --   --  83  --   --  79  --  82  --   BILITOT 0.4  --   --   --   --   --   --  0.4  --   --  0.5  --  0.3  --   ALBUMIN 2.7*  --   --   --   --   --   --  2.4*  --   --  2.2*  --  2.0*  --   MG  --   --   --   --   --   --   --   --   --   --  1.7  --  1.9 2.2  LATICACIDVEN  --    < > 4.0*  --  3.6* 3.0* 1.9  --  2.1*  --   --   --   --   --   BNP 235.4*  --   --   --   --   --   --   --   --   --   --   --   --   --    < > = values in this interval not displayed.    ------------------------------------------------------------------------------------------------------------------ No results for input(s): "CHOL", "HDL", "LDLCALC", "TRIG", "CHOLHDL", "LDLDIRECT" in the last 72 hours.  Lab Results  Component Value Date   HGBA1C 5.8 (H) 02/14/2023    ------------------------------------------------------------------------------------------------------------------ No results for input(s): "TSH", "T4TOTAL", "T3FREE", "THYROIDAB" in the last 72 hours.  Invalid input(s): "FREET3"  Cardiac Enzymes No results for input(s): "CKMB", "TROPONINI", "MYOGLOBIN" in the last 168 hours.  Invalid input(s): "CK" ------------------------------------------------------------------------------------------------------------------    Component Value Date/Time   BNP 235.4 (H) 03/09/2023 0748    CBG: Recent Labs  Lab 03/14/23 1233 03/14/23 1814 03/14/23 2117 03/14/23 2327 03/15/23 0603  GLUCAP 202* 146* 160* 200* 209*    Recent Results (from the past 240 hour(s))  Resp panel by RT-PCR (RSV, Flu A&B, Covid) Anterior Nasal Swab     Status: None   Collection Time: 03/09/23  7:23 AM   Specimen: Anterior Nasal Swab  Result Value Ref Range Status   SARS Coronavirus 2 by RT PCR NEGATIVE NEGATIVE Final   Influenza A by PCR NEGATIVE NEGATIVE Final   Influenza B by PCR NEGATIVE NEGATIVE Final    Comment: (NOTE) The Xpert Xpress SARS-CoV-2/FLU/RSV plus assay is intended as an aid in the diagnosis of influenza from Nasopharyngeal swab specimens and should not be used as a sole basis for treatment. Nasal washings and aspirates are unacceptable for Xpert Xpress SARS-CoV-2/FLU/RSV testing.  Fact Sheet for Patients: BloggerCourse.com  Fact Sheet for Healthcare Providers: SeriousBroker.it  This test is not yet approved or cleared by the [place] FDA and has been authorized for detection and/or diagnosis of SARS-CoV-2 by FDA under an Emergency Use Authorization (EUA). This EUA will remain in effect (meaning this test can be used) for the duration of the COVID-19 declaration under Section 564(b)(1) of the Act, 21 U.S.C. section 360bbb-3(b)(1), unless the authorization is terminated  or revoked.     Resp Syncytial Virus by PCR NEGATIVE NEGATIVE Final  Comment: (NOTE) Fact Sheet for Patients: BloggerCourse.com  Fact Sheet for Healthcare Providers: SeriousBroker.it  This test is not yet approved or cleared by the [place] FDA and has been authorized for detection and/or diagnosis of SARS-CoV-2 by FDA under an Emergency Use Authorization (EUA). This EUA will remain in effect (meaning this test can be used) for the duration of the COVID-19 declaration under Section 564(b)(1) of the Act, 21 U.S.C. section 360bbb-3(b)(1), unless the authorization is terminated or revoked.  Performed at [facility], [address] [place], [place] [address]   Blood culture (routine x 2)     Status: Abnormal   Collection Time: 03/09/23  8:40 AM   Specimen: BLOOD LEFT ARM  Result Value Ref Range Status   Specimen Description BLOOD LEFT ARM  Final   Special Requests   Final    BOTTLES DRAWN AEROBIC AND ANAEROBIC Blood Culture adequate volume   Culture  Setup Time   Final    GRAM POSITIVE COCCI IN CLUSTERS ANAEROBIC BOTTLE ONLY CRITICAL RESULT CALLED TO, READ BACK BY AND VERIFIED WITH: PHARMD J. LEDFORD 03/10/23 @ 0606 BY AB    Culture (A)  Final    STAPHYLOCOCCUS HOMINIS THE SIGNIFICANCE OF ISOLATING THIS ORGANISM FROM A SINGLE SET OF BLOOD CULTURES WHEN MULTIPLE SETS ARE DRAWN IS UNCERTAIN. PLEASE NOTIFY THE MICROBIOLOGY DEPARTMENT WITHIN ONE WEEK IF SPECIATION AND SENSITIVITIES ARE REQUIRED. Performed at [facility], [address], [place], [place] [address]    Report Status 03/12/2023 FINAL  Final  Blood Culture ID Panel (Reflexed)     Status: Abnormal   Collection Time: 03/09/23  8:40 AM  Result Value Ref Range Status   Enterococcus faecalis NOT DETECTED NOT DETECTED Final   Enterococcus Faecium NOT DETECTED NOT DETECTED Final   Listeria monocytogenes NOT DETECTED NOT DETECTED Final   Staphylococcus  species DETECTED (A) NOT DETECTED Final    Comment: CRITICAL RESULT CALLED TO, READ BACK BY AND VERIFIED WITH: PHARMD J. LEDFORD 03/10/23 @ 0606 BY AB    Staphylococcus aureus (BCID) NOT DETECTED NOT DETECTED Final   Staphylococcus epidermidis DETECTED (A) NOT DETECTED Final    Comment: Methicillin (oxacillin) resistant coagulase negative staphylococcus. Possible blood culture contaminant (unless isolated from more than one blood culture draw or clinical case suggests pathogenicity). No antibiotic treatment is indicated for blood  culture contaminants. CRITICAL RESULT CALLED TO, READ BACK BY AND VERIFIED WITH: PHARMD J. LEDFORD 03/10/23 @ 0606 BY AB    Staphylococcus lugdunensis NOT DETECTED NOT DETECTED Final   Streptococcus species NOT DETECTED NOT DETECTED Final   Streptococcus agalactiae NOT DETECTED NOT DETECTED Final   Streptococcus pneumoniae NOT DETECTED NOT DETECTED Final   Streptococcus pyogenes NOT DETECTED NOT DETECTED Final   A.calcoaceticus-baumannii NOT DETECTED NOT DETECTED Final   Bacteroides fragilis NOT DETECTED NOT DETECTED Final   Enterobacterales NOT DETECTED NOT DETECTED Final   Enterobacter cloacae complex NOT DETECTED NOT DETECTED Final   Escherichia coli NOT DETECTED NOT DETECTED Final   Klebsiella aerogenes NOT DETECTED NOT DETECTED Final   Klebsiella oxytoca NOT DETECTED NOT DETECTED Final   Klebsiella pneumoniae NOT DETECTED NOT DETECTED Final   Proteus species NOT DETECTED NOT DETECTED Final   Salmonella species NOT DETECTED NOT DETECTED Final   Serratia marcescens NOT DETECTED NOT DETECTED Final   Haemophilus influenzae NOT DETECTED NOT DETECTED Final   Neisseria meningitidis NOT DETECTED NOT DETECTED Final   Pseudomonas aeruginosa NOT DETECTED NOT DETECTED Final   Stenotrophomonas maltophilia NOT  DETECTED NOT DETECTED Final   Candida albicans NOT DETECTED NOT DETECTED Final   Candida auris NOT DETECTED NOT DETECTED Final   Candida glabrata NOT DETECTED  NOT DETECTED Final   Candida krusei NOT DETECTED NOT DETECTED Final   Candida parapsilosis NOT DETECTED NOT DETECTED Final   Candida tropicalis NOT DETECTED NOT DETECTED Final   Cryptococcus neoformans/gattii NOT DETECTED NOT DETECTED Final   Methicillin resistance mecA/C DETECTED (A) NOT DETECTED Final    Comment: CRITICAL RESULT CALLED TO, READ BACK BY AND VERIFIED WITH: PHARMD J. LEDFORD 03/10/23 @ 0606 BY AB Performed at [facility], [address] [place], [place] [address]   Blood culture (routine x 2)     Status: None   Collection Time: 03/09/23  8:41 AM   Specimen: BLOOD RIGHT ARM  Result Value Ref Range Status   Specimen Description BLOOD RIGHT ARM  Final   Special Requests   Final    BOTTLES DRAWN AEROBIC AND ANAEROBIC Blood Culture adequate volume   Culture   Final    NO GROWTH 5 DAYS Performed at [facility], [address] [place], [place] [address]    Report Status 03/14/2023 FINAL  Final  Culture, Respiratory w Gram Stain     Status: None   Collection Time: 03/09/23  3:46 PM   Specimen: Tracheal Aspirate  Result Value Ref Range Status   Specimen Description TRACHEAL ASPIRATE  Final   Special Requests NONE  Final   Gram Stain   Final    NO WBC SEEN FEW BUDDING YEAST SEEN Performed at [facility], [address], [place], [place] [address]    Culture FEW CANDIDA ALBICANS FEW CANDIDA GLABRATA   Final   Report Status 03/12/2023 FINAL  Final  MRSA Next Gen by PCR, Nasal     Status: None   Collection Time: 03/09/23  8:29 PM   Specimen: Nasal Mucosa; Nasal Swab  Result Value Ref Range Status   MRSA by PCR Next Gen NOT DETECTED NOT DETECTED Final    Comment: (NOTE) The GeneXpert MRSA Assay (FDA approved for NASAL specimens only), is one component of a comprehensive MRSA colonization surveillance program. It is not intended to diagnose MRSA infection nor to guide or monitor treatment for MRSA infections. Test performance is not FDA approved  in patients less than 56 years old. Performed at [facility], [address], [place], [place] [address]      Radiology Studies: No results found.   Pamella Pert, MD, PhD [facility]  Between 7 am - 7 pm I am available, please contact me via Amion (for emergencies) or Securechat (non urgent messages)  Between 7 pm - 7 am I am not available, please contact night coverage MD/APP via Amion

## 2023-03-16 DIAGNOSIS — A419 Sepsis, unspecified organism: Secondary | ICD-10-CM | POA: Diagnosis not present

## 2023-03-16 DIAGNOSIS — J189 Pneumonia, unspecified organism: Secondary | ICD-10-CM | POA: Diagnosis not present

## 2023-03-16 LAB — CBC
HCT: 23.4 % — ABNORMAL LOW (ref 36.0–46.0)
Hemoglobin: 7.5 g/dL — ABNORMAL LOW (ref 12.0–15.0)
MCH: 31.8 pg (ref 26.0–34.0)
MCHC: 32.1 g/dL (ref 30.0–36.0)
MCV: 99.2 fL (ref 80.0–100.0)
Platelets: 276 10*3/uL (ref 150–400)
RBC: 2.36 MIL/uL — ABNORMAL LOW (ref 3.87–5.11)
RDW: 16.8 % — ABNORMAL HIGH (ref 11.5–15.5)
WBC: 9.9 10*3/uL (ref 4.0–10.5)
nRBC: 0.2 % (ref 0.0–0.2)

## 2023-03-16 LAB — GLUCOSE, CAPILLARY
Glucose-Capillary: 188 mg/dL — ABNORMAL HIGH (ref 70–99)
Glucose-Capillary: 220 mg/dL — ABNORMAL HIGH (ref 70–99)
Glucose-Capillary: 239 mg/dL — ABNORMAL HIGH (ref 70–99)

## 2023-03-16 LAB — BASIC METABOLIC PANEL
Anion gap: 7 (ref 5–15)
BUN: 14 mg/dL (ref 8–23)
CO2: 32 mmol/L (ref 22–32)
Calcium: 8.7 mg/dL — ABNORMAL LOW (ref 8.9–10.3)
Chloride: 103 mmol/L (ref 98–111)
Creatinine, Ser: 0.68 mg/dL (ref 0.44–1.00)
GFR, Estimated: >60 mL/min (ref >60)
Glucose, Bld: 238 mg/dL — ABNORMAL HIGH (ref 70–99)
Potassium: 3.6 mmol/L (ref 3.5–5.1)
Sodium: 142 mmol/L (ref 135–145)

## 2023-03-16 LAB — MAGNESIUM: Magnesium: 1.9 mg/dL (ref 1.7–2.4)

## 2023-03-16 MED ORDER — ORAL CARE MOUTH RINSE
15.0000 mL | OROMUCOSAL | Status: DC
  Administered 2023-03-16 (×2): 15 mL via OROMUCOSAL

## 2023-03-16 MED ORDER — ORAL CARE MOUTH RINSE: 15.0000 mL | OROMUCOSAL | Status: DC | PRN

## 2023-03-16 NOTE — TOC Transition Note (Signed)
Transition of Care [facility]) - CM/SW Discharge Note   Patient Details  Name: Gina Kelley MRN: [id number] Date of Birth: 02/15/1936  Transition of Care [facility]) CM/SW Contact:  Eduard Roux, LCSW Phone Number: 03/16/2023, 2:24 PM   Clinical Narrative:     Patient will Discharge to: [facility] Discharge Date: 03/16/2023 Family Notified: daughter Transport By: Sharin Mons  Per MD patient is ready for discharge. RN, patient, and facility notified of discharge. Discharge Summary sent to facility. RN given number for [phone], Room 305. Ambulance transport requested for patient.   Clinical Social Worker signing off.   Final next level of care: Skilled Nursing Facility Barriers to Discharge: Barriers Resolved   Patient Goals and CMS Choice      Discharge Placement                Patient chooses bed at: [facility] Patient to be transferred to facility by: [facility] Name of family member notified: daughter Patient and family notified of of transfer: 03/16/23  Discharge Plan and Services Additional resources added to the After Visit Summary for                                       Social Determinants of Health (SDOH) Interventions SDOH Screenings   Food Insecurity: No Food Insecurity (03/02/2023)  Housing: Patient Unable To Answer (03/02/2023)  Transportation Needs: No Transportation Needs (03/02/2023)  Utilities: Not At Risk (03/02/2023)  Depression (PHQ2-9): Low Risk  (11/15/2021)  Tobacco Use: Medium Risk (03/09/2023)     Readmission Risk Interventions     No data to display

## 2023-03-16 NOTE — Discharge Summary (Signed)
Physician Discharge Summary  Gina Kelley ZOX:[id number] DOB: [birth year] DOA: 03/09/2023  PCP: Patient, No Pcp Per  Admit date: 03/09/2023 Discharge date: 03/16/2023  Admitted From: [name] Disposition:  [name]  Recommendations for Outpatient Follow-up:  Follow up with PCP in 1-2 weeks Remains very high risk of ongoing silent aspiration, recurrent pneumonias and recurrent hospitalizations Recommend palliative care follow-up following discharge  Home Health: none Equipment/Devices: none  Discharge Condition: stable  CODE STATUS: Full code Diet Orders (From admission, onward)     Start     Ordered   03/09/23 1406  Diet NPO time specified Except for: Other (See Comments)  Diet effective now       Comments: Meds thur tube  Question:  Except for  Answer:  Other (See Comments)   03/09/23 1406            HPI: Per admitting MD, Gina Kelley is a 87 y.o. female with medical history significant of HTN, DM type II, large spontaneous subdural hematoma and intracerebral hemorrhage in 06/2022 requiring craniotomy, status post chronic indwelling Foley catheter and pain, and chronic  respiratory failure on 5 L of nasal cannula oxygen, history of GERD PUD, bed bound presents after having drop in her O2 saturations.  She is obtained from review of records and family present at bedside.  She just recently been hospitalized from 9/1-9/4 acute metabolic encephalopathy thought secondary to a Citrobacter UTI.  Prior to that patient had been hospitalized 8/16-8/21 with sepsis secondary to aspiration pneumonia.  Patient was evaluated by speech and recommended to remain n.p.o. PEG tube feeds.  Family had made note that her sleep issues have recently been increased from 45 mL/h up to 55 mL/h due to weight loss since hospitalization.  Family notes patient no longer needs assistance with eating.  She had recently had episodes of nausea and vomiting.  Staff noted O2 saturations dropped to 74% on 5 L and called  EMS.   Hospital Course / Discharge diagnoses: Principal Problem:   Sepsis due to pneumonia [facility]) Active Problems:   Chronic respiratory failure with hypoxia (HCC)   Elevated troponin   S/P craniotomy   History of intracranial hemorrhage   Dysphagia   Acute metabolic encephalopathy   Normocytic anemia   Essential hypertension   Type 2 diabetes mellitus (HCC)   Transaminitis   Seizure disorder (HCC)   Hyperlipidemia   Principal problem Sepsis due to aspiration pneumonia, acute on chronic hypoxic respiratory failure -likely due to recurrent aspiration.  Has remained stable, she is alert, feeds were resumed and tolerating well.  Has been placed on antibiotics and has completed 7-day course while hospitalized. Palliative consulted to discuss goals of care given chronic medical problems, recurrent aspiration, recurrent hospitalizations, bedbound status, [name] are ongoing, but for now remains full code/full scope.  Would encourage ongoing discussions as an outpatient   Active problems 1/4 bottles staph epi -likely contaminant Acute metabolic encephalopathy-due to #1.  Improved  Troponin elevation-likely in the setting of sepsis, demand ischemia History of ICH/SDH s/p craniotomy, chronic dysphagia -continue PEG tube and n.p.o.  Normocytic anemia- She has been evaluated by GI in the past and was  thought not to be a candidate for endoscopy.  Follow trend.  Overall stable, did not require transfusions Essential hypertension -Continue home medications Hypokalemia-has been repleted prior to discharge Leukocytosis-resolved Diabetes mellitus type 2, with long-term use of insulin -continue home medications Transaminitis - Acute on chronic.  AST 209 and ALT 212 acutely elevated from  prior with lipase elevated as well.  CT did not note any acute intra-abdominal process.  Improving History of bladder outlet obstruction -no apparent current issues Seizure disorder -Continue Keppra Hyperlipidemia  -resume statin  Depression -Continue Prozac GERD -Continue Protonix  Discharge Instructions   Allergies as of 03/16/2023       Reactions   Cozaar [losartan] Swelling   Angioedema    Periogard [chlorhexidine Gluconate] Swelling, Other (See Comments)   Skin irritation Eye/tongue/lip swelling   Pork Allergy Other (See Comments)   Unknown reaction   Vasotec [enalapril] Cough        Medication List     TAKE these medications    acetaminophen 500 MG tablet Commonly known as: TYLENOL Place 500 mg into feeding tube 4 (four) times daily as needed for mild pain or moderate pain.   acetaminophen 500 MG tablet Commonly known as: TYLENOL Place 1,000 mg into feeding tube in the morning and at bedtime.   amLODipine 10 MG tablet Commonly known as: NORVASC Place 1 tablet (10 mg total) into feeding tube daily.   ascorbic acid 500 MG tablet Commonly known as: VITAMIN C Place 1 tablet (500 mg total) into feeding tube daily.   atorvastatin 80 MG tablet Commonly known as: LIPITOR Place 1 tablet (80 mg total) into feeding tube daily.   carvedilol 6.25 MG tablet Commonly known as: COREG Place 1 tablet (6.25 mg total) into feeding tube 2 (two) times daily with a meal.   ezetimibe 10 MG tablet Commonly known as: ZETIA Place 1 tablet (10 mg total) into feeding tube daily.   famotidine 20 MG tablet Commonly known as: PEPCID Place 1 tablet (20 mg total) into feeding tube 2 (two) times daily.   feeding supplement (GLUCERNA 1.5 CAL) Liqd Place 1,000 mLs into feeding tube daily. What changed: how much to take   ferrous sulfate 220 (44 Fe) MG/5ML solution Place 408 mg into feeding tube daily.   FLUoxetine 20 MG capsule Commonly known as: PROZAC Place 1 capsule (20 mg total) into feeding tube at bedtime.   free water Soln Place 120 mLs into feeding tube every 4 (four) hours.   gabapentin 250 MG/5ML solution Commonly known as: NEURONTIN Place 2 mLs (100 mg total) into feeding  tube every 8 (eight) hours.   guaiFENesin 100 MG/5ML liquid Commonly known as: ROBITUSSIN Place 200 mg into feeding tube 4 (four) times daily.   insulin lispro 100 UNIT/ML KwikPen Commonly known as: HUMALOG Inject 0-16 Units into the skin See admin instructions. Inject 0-15 units 4 times daily per sliding scale: < 69 : implement hypoglycemic protocol 70-150 : 0 units 151-200 : 3 units 201-250 : 6 units 251-300 : 9 units 301-350 : 12 units 351-400 : 16 units > 401 : call MD   ipratropium-albuterol 0.5-2.5 (3) MG/3ML Soln Commonly known as: DUONEB Take 3 mLs by nebulization every 4 (four) hours as needed.   l-methylfolate-B6-B12 3-35-2 MG Tabs tablet Commonly known as: METANX Place 1 tablet into feeding tube daily.   levETIRAcetam 100 MG/ML solution Commonly known as: Keppra Place 5 mLs (500 mg total) into feeding tube 2 (two) times daily.   lidocaine 4 % Place 1 patch onto the skin daily.   metFORMIN 500 MG tablet Commonly known as: GLUCOPHAGE Place 1 tablet (500 mg total) into feeding tube 2 (two) times daily with a meal. What changed: when to take this   ondansetron 4 MG tablet Commonly known as: ZOFRAN Take 4 mg by mouth every 8 (  eight) hours as needed for nausea or vomiting.   pantoprazole 2 mg/mL suspension Commonly known as: PROTONIX Place 40 mg into feeding tube daily.   PreserVision AREDS 2 Chew Give 1 each by tube 2 (two) times daily.       Consultations: Palliative care  Procedures/Studies:  DG Abd 1 View  Result Date: 03/10/2023 CLINICAL DATA:  Vomiting EXAM: ABDOMEN - 1 VIEW COMPARISON:  Abdomen/pelvis 1 day prior FINDINGS: A gastrostomy tube is noted projecting over the midabdomen. There is nonobstructive bowel gas pattern. There is no definite free intraperitoneal air, within the confines of supine technique Contrast is noted in the bladder from the recent CT. A calcified uterine fibroid is noted. IMPRESSION: No acute finding in the abdomen.  Electronically Signed   By: Lesia Hausen M.D.   On: 03/10/2023 13:09   DG CHEST PORT 1 VIEW  Result Date: 03/10/2023 CLINICAL DATA:  Acute respiratory distress EXAM: PORTABLE CHEST 1 VIEW COMPARISON:  Chest x-ray 03/09/2023 FINDINGS: Bilateral airspace disease has not significantly changed. Costophrenic angles are clear. There is no pneumothorax or pleural effusion. The cardiomediastinal silhouette is within normal limits. No acute fractures are seen. IMPRESSION: Bilateral airspace disease has not significantly changed. Electronically Signed   By: Darliss Cheney M.D.   On: 03/10/2023 03:59   CT CHEST ABDOMEN PELVIS W CONTRAST  Result Date: 03/09/2023 CLINICAL DATA:  Sepsis.  Mental status change. EXAM: CT CHEST, ABDOMEN, AND PELVIS WITH CONTRAST TECHNIQUE: Multidetector CT imaging of the chest, abdomen and pelvis was performed following the standard protocol during bolus administration of intravenous contrast. RADIATION DOSE REDUCTION: This exam was performed according to the departmental dose-optimization program which includes automated exposure control, adjustment of the mA and/or kV according to patient size and/or use of iterative reconstruction technique. CONTRAST:  75mL OMNIPAQUE IOHEXOL 350 MG/ML SOLN COMPARISON:  View chest x-ray 03/09/2023 FINDINGS: CT CHEST FINDINGS Cardiovascular: The heart size is normal. Coronary artery calcifications are present. Atherosclerotic changes are present at the aortic arch great vessel origins without aneurysm or focal stenosis. Pulmonary artery size is normal. No filling defects are present. Mediastinum/Nodes: Multiple right paratracheal lymph nodes measure up to 8 mm. Mild bilateral hilar adenopathy is present. The thoracic inlet is within normal limits. The upper soft a Gus is gas-filled. No obstructing lesion is present. Lungs/Pleura: Patchy bilateral airspace opacities are confirmed. Consolidated airspace disease is more prominent in the lower lobes, right  greater than left. The airways are patent. No focal mass lesion is present. No significant pleural disease is present. Musculoskeletal: Vertebral body heights and alignment are normal. Mild anterior degenerative changes are present in the upper thoracic spine. The thoracic kyphosis is normal. The ribs are unremarkable. CT ABDOMEN PELVIS FINDINGS Hepatobiliary: Mild intra and extrahepatic biliary dilation is within normal limits following cholecystectomy. No focal hepatic lesions are present. Pancreas: Unremarkable. No pancreatic ductal dilatation or surrounding inflammatory changes. Spleen: Normal in size without focal abnormality. Adrenals/Urinary Tract: The kidneys are unremarkable. No stone or mass lesion is present. Obstruction is present. The ureters are normal bilaterally. The urinary bladder is within normal limits. Stomach/Bowel: Stomach and duodenum scratched at a gastrostomy tube is in place. The stomach and duodenum are otherwise within normal limits. The small bowel is unremarkable. The terminal ileum is normal. The ascending and transverse colon are normal. The descending and sigmoid colon are within normal limits. Vascular/Lymphatic: Atherosclerotic calcifications are present in the aorta and branch vessels. No aneurysm is present. Reproductive: 5 cm calcified fibroid is present  posteriorly on the left. The uterus and adnexa are otherwise unremarkable. Other: A 15 mm paraumbilical hernia contains fat without bowel. No other significant ventral hernia is present. No free fluid or free air is present. Musculoskeletal: Degenerative changes are present in the lower lumbar spine with slight anterolisthesis at L4-5. No focal osseous lesions are present. IMPRESSION: 1. Patchy bilateral airspace opacities compatible with multifocal pneumonia. 2. Mild bilateral hilar and mediastinal adenopathy is likely reactive. 3. Coronary artery disease. 4. 5 cm calcified fibroid posteriorly on the left. 5. 15 mm  paraumbilical hernia contains fat without bowel. 6. Degenerative changes in the lower lumbar spine with slight anterolisthesis at L4-5. 7.  Aortic Atherosclerosis (ICD10-I70.0). Electronically Signed   By: Marin Roberts M.D.   On: 03/09/2023 11:36   CT Head Wo Contrast  Result Date: 03/09/2023 CLINICAL DATA:  Mental status change, unknown cause. EXAM: CT HEAD WITHOUT CONTRAST TECHNIQUE: Contiguous axial images were obtained from the base of the skull through the vertex without intravenous contrast. RADIATION DOSE REDUCTION: This exam was performed according to the departmental dose-optimization program which includes automated exposure control, adjustment of the mA and/or kV according to patient size and/or use of iterative reconstruction technique. COMPARISON:  Head CT 03/01/2023.  MRI brain 03/02/2023. FINDINGS: Brain: No acute intracranial hemorrhage. Unchanged sequela of prior intraparenchymal hemorrhage in the right frontal lobe. Stable background of moderate chronic small-vessel disease with old lacunar infarcts in the bilateral basal ganglia. No acute hydrocephalus or extra-axial collection. No mass effect or midline shift. Vascular: No hyperdense vessel or unexpected calcification. Skull: Prior right frontotemporal craniotomy. No acute calvarial fracture. Skull base is unremarkable. Sinuses/Orbits: Trace fluid in the sphenoid sinuses. Orbits are unremarkable. Other: None. IMPRESSION: 1. No acute intracranial abnormality. 2. Unchanged sequela of prior intraparenchymal hemorrhage in the right frontal lobe. 3. Stable background of moderate chronic small-vessel disease with old lacunar infarcts in the bilateral basal ganglia. Electronically Signed   By: Orvan Falconer M.D.   On: 03/09/2023 11:04   DG Chest Portable 1 View  Result Date: 03/09/2023 CLINICAL DATA:  Cough.  Possible aspiration.  Vomiting. EXAM: PORTABLE CHEST 1 VIEW COMPARISON:  03/01/2023 FINDINGS: Interval development of patchy and  nodular airspace disease in both upper lungs. More confluent airspace consolidative opacity is seen in the right hilar region. Stable asymmetric elevation right hemidiaphragm. Cardiopericardial silhouette is at upper limits of normal for size. Telemetry leads overlie the chest. IMPRESSION: Interval development of patchy and nodular airspace disease in both upper lungs with more confluent airspace disease in the right hilar region. Imaging features are compatible with multifocal pneumonia. Despite the prominent nodularity of these acute findings, rapid interval development would make metastatic disease unlikely. Electronically Signed   By: Kennith Center M.D.   On: 03/09/2023 08:05   MR BRAIN WO CONTRAST  Result Date: 03/02/2023 CLINICAL DATA:  87 year old female with altered mental status, neurologic deficit. History of right hemisphere intra-axial hemorrhage with subdural extension in January, craniotomy at that time. EXAM: MRI HEAD WITHOUT CONTRAST TECHNIQUE: Multiplanar, multiecho pulse sequences of the brain and surrounding structures were obtained without intravenous contrast. COMPARISON:  Previous head CTs 03/01/2023 and earlier. FINDINGS: Brain: Right hemisphere encephalomalacia and gliosis with superficial siderosis and some residual intra-axial hemosiderin on SWI. Overlying craniotomy and mild ex vacuo enlargement of the right lateral ventricle. Blood product related susceptibility on DWI. No restricted diffusion or evidence of acute infarction. Evidence of Wallerian degeneration in the right deep gray nuclei, also the brainstem. Superimposed additional scattered  chronic microhemorrhages in the brain, including clustered in the right occipital lobe. And some superficial siderosis there which is somewhat remote from the surgery site., no definite contralateral or posterior fossa siderosis. Chronic lacunar infarct of the left lentiform. Chronic lacunar infarcts in the central pons. Small chronic infarct in  the left cerebellum. No midline shift, mass effect, evidence of mass lesion, IVH, ventriculomegaly,or acute intracranial hemorrhage. Cervicomedullary junction and pituitary are within normal limits. Vascular: Major intracranial vascular flow voids are preserved, the distal right vertebral artery appears dominant. There is evidence of intracranial atherosclerosis. Skull and upper cervical spine: Negative for age visible cervical spine. Visualized bone marrow signal is within normal limits. Previous right convexity craniotomy. Sinuses/Orbits: Postoperative changes to both globes. Paranasal Visualized paranasal sinuses and mastoids are stable and well aerated. Other: Grossly negative visible internal auditory structures. No acute orbit or scalp soft tissue finding. IMPRESSION: 1. No acute intracranial abnormality. 2. Sequelae of right hemisphere hemorrhage and craniotomy with relatively extensive encephalomalacia, superficial siderosis, and Wallerian degeneration. Superimposed chronic small vessel disease in the left hemisphere, brainstem, to a lesser extent cerebellum. Additional chronic microhemorrhages are mostly in the right hemisphere. Still, amyloid angiopathy is difficult to exclude. Electronically Signed   By: Odessa Fleming M.D.   On: 03/02/2023 06:58   CT Head Wo Contrast  Result Date: 03/01/2023 CLINICAL DATA:  Mental status change, unknown cause. Low blood pressures. EXAM: CT HEAD WITHOUT CONTRAST TECHNIQUE: Contiguous axial images were obtained from the base of the skull through the vertex without intravenous contrast. RADIATION DOSE REDUCTION: This exam was performed according to the departmental dose-optimization program which includes automated exposure control, adjustment of the mA and/or kV according to patient size and/or use of iterative reconstruction technique. COMPARISON:  CT head 02/13/2023 FINDINGS: Brain: No intracranial hemorrhage, mass effect, or evidence of acute infarct. No hydrocephalus. No  extra-axial fluid collection. Age-commensurate cerebral atrophy and ill-defined hypoattenuation within the cerebral white matter consistent with chronic small vessel ischemic disease. Unchanged right frontal encephalomalacia. Remote left basal ganglia infarct. Vascular: No hyperdense vessel. Intracranial arterial calcification. Skull: No fracture or focal lesion.  Right craniotomy. Sinuses/Orbits: No acute finding. Paranasal sinuses and mastoid air cells are well aerated. Other: None. IMPRESSION: 1. No acute intracranial abnormality. 2. Chronic small vessel ischemic disease and remote infarcts as described. Electronically Signed   By: Minerva Fester M.D.   On: 03/01/2023 20:24   DG Chest Port 1 View  Result Date: 03/01/2023 CLINICAL DATA:  Cough, weakness EXAM: PORTABLE CHEST 1 VIEW COMPARISON:  Chest radiograph dated 02/13/2023. FINDINGS: The heart size and mediastinal contours are within normal limits. Vascular calcifications are seen in the aortic arch. The right hemidiaphragm is elevated relative to the left. Both lungs are clear. Degenerative changes are seen in the spine. IMPRESSION: No active cardiopulmonary disease. Electronically Signed   By: Romona Curls M.D.   On: 03/01/2023 19:13    Subjective: - no chest pain, shortness of breath, no abdominal pain, nausea or vomiting.   Discharge Exam: BP (!) 153/76 (BP Location: Right Arm)   Pulse 90   Temp 98.8 F (37.1 C) (Oral)   Resp 20   Ht 5\' 3"  (1.6 m)   Wt 53.8 kg   SpO2 96%   BMI 21.01 kg/m   General: Pt is alert, awake, not in acute distress Cardiovascular: RRR, S1/S2 +, no rubs, no gallops Respiratory: CTA bilaterally, no wheezing, no rhonchi Abdominal: Soft, NT, ND, bowel sounds + Extremities: no edema, no cyanosis  The results of significant diagnostics from this hospitalization (including imaging, microbiology, ancillary and laboratory) are listed below for reference.     Microbiology: Recent Results (from the past 240  hour(s))  Resp panel by RT-PCR (RSV, Flu A&B, Covid) Anterior Nasal Swab     Status: None   Collection Time: 03/09/23  7:23 AM   Specimen: Anterior Nasal Swab  Result Value Ref Range Status   SARS Coronavirus 2 by RT PCR NEGATIVE NEGATIVE Final   Influenza A by PCR NEGATIVE NEGATIVE Final   Influenza B by PCR NEGATIVE NEGATIVE Final    Comment: (NOTE) The Xpert Xpress SARS-CoV-2/FLU/RSV plus assay is intended as an aid in the diagnosis of influenza from Nasopharyngeal swab specimens and should not be used as a sole basis for treatment. Nasal washings and aspirates are unacceptable for Xpert Xpress SARS-CoV-2/FLU/RSV testing.  Fact Sheet for Patients: BloggerCourse.com  Fact Sheet for Healthcare Providers: SeriousBroker.it  This test is not yet approved or cleared by the [place] FDA and has been authorized for detection and/or diagnosis of SARS-CoV-2 by FDA under an Emergency Use Authorization (EUA). This EUA will remain in effect (meaning this test can be used) for the duration of the COVID-19 declaration under Section 564(b)(1) of the Act, 21 U.S.C. section 360bbb-3(b)(1), unless the authorization is terminated or revoked.     Resp Syncytial Virus by PCR NEGATIVE NEGATIVE Final    Comment: (NOTE) Fact Sheet for Patients: BloggerCourse.com  Fact Sheet for Healthcare Providers: SeriousBroker.it  This test is not yet approved or cleared by the [place] FDA and has been authorized for detection and/or diagnosis of SARS-CoV-2 by FDA under an Emergency Use Authorization (EUA). This EUA will remain in effect (meaning this test can be used) for the duration of the COVID-19 declaration under Section 564(b)(1) of the Act, 21 U.S.C. section 360bbb-3(b)(1), unless the authorization is terminated or revoked.  Performed at [name], [address] [place],  [place] [address]   Blood culture (routine x 2)     Status: Abnormal   Collection Time: 03/09/23  8:40 AM   Specimen: BLOOD LEFT ARM  Result Value Ref Range Status   Specimen Description BLOOD LEFT ARM  Final   Special Requests   Final    BOTTLES DRAWN AEROBIC AND ANAEROBIC Blood Culture adequate volume   Culture  Setup Time   Final    GRAM POSITIVE COCCI IN CLUSTERS ANAEROBIC BOTTLE ONLY CRITICAL RESULT CALLED TO, READ BACK BY AND VERIFIED WITH: PHARMD J. LEDFORD 03/10/23 @ 0606 BY AB    Culture (A)  Final    STAPHYLOCOCCUS HOMINIS THE SIGNIFICANCE OF ISOLATING THIS ORGANISM FROM A SINGLE SET OF BLOOD CULTURES WHEN MULTIPLE SETS ARE DRAWN IS UNCERTAIN. PLEASE NOTIFY THE MICROBIOLOGY DEPARTMENT WITHIN ONE WEEK IF SPECIATION AND SENSITIVITIES ARE REQUIRED. Performed at [facility], [address] [place], [place] [address]    Report Status 03/12/2023 FINAL  Final  Blood Culture ID Panel (Reflexed)     Status: Abnormal   Collection Time: 03/09/23  8:40 AM  Result Value Ref Range Status   Enterococcus faecalis NOT DETECTED NOT DETECTED Final   Enterococcus Faecium NOT DETECTED NOT DETECTED Final   Listeria monocytogenes NOT DETECTED NOT DETECTED Final   Staphylococcus species DETECTED (A) NOT DETECTED Final    Comment: CRITICAL RESULT CALLED TO, READ BACK BY AND VERIFIED WITH: PHARMD J. LEDFORD 03/10/23 @ 0606 BY AB    Staphylococcus aureus (BCID) NOT DETECTED NOT DETECTED  Final   Staphylococcus epidermidis DETECTED (A) NOT DETECTED Final    Comment: Methicillin (oxacillin) resistant coagulase negative staphylococcus. Possible blood culture contaminant (unless isolated from more than one blood culture draw or clinical case suggests pathogenicity). No antibiotic treatment is indicated for blood  culture contaminants. CRITICAL RESULT CALLED TO, READ BACK BY AND VERIFIED WITH: PHARMD J. LEDFORD 03/10/23 @ 0606 BY AB    Staphylococcus lugdunensis NOT DETECTED NOT DETECTED Final    Streptococcus species NOT DETECTED NOT DETECTED Final   Streptococcus agalactiae NOT DETECTED NOT DETECTED Final   Streptococcus pneumoniae NOT DETECTED NOT DETECTED Final   Streptococcus pyogenes NOT DETECTED NOT DETECTED Final   A.calcoaceticus-baumannii NOT DETECTED NOT DETECTED Final   Bacteroides fragilis NOT DETECTED NOT DETECTED Final   Enterobacterales NOT DETECTED NOT DETECTED Final   Enterobacter cloacae complex NOT DETECTED NOT DETECTED Final   Escherichia coli NOT DETECTED NOT DETECTED Final   Klebsiella aerogenes NOT DETECTED NOT DETECTED Final   Klebsiella oxytoca NOT DETECTED NOT DETECTED Final   Klebsiella pneumoniae NOT DETECTED NOT DETECTED Final   Proteus species NOT DETECTED NOT DETECTED Final   Salmonella species NOT DETECTED NOT DETECTED Final   Serratia marcescens NOT DETECTED NOT DETECTED Final   Haemophilus influenzae NOT DETECTED NOT DETECTED Final   Neisseria meningitidis NOT DETECTED NOT DETECTED Final   Pseudomonas aeruginosa NOT DETECTED NOT DETECTED Final   Stenotrophomonas maltophilia NOT DETECTED NOT DETECTED Final   Candida albicans NOT DETECTED NOT DETECTED Final   Candida auris NOT DETECTED NOT DETECTED Final   Candida glabrata NOT DETECTED NOT DETECTED Final   Candida krusei NOT DETECTED NOT DETECTED Final   Candida parapsilosis NOT DETECTED NOT DETECTED Final   Candida tropicalis NOT DETECTED NOT DETECTED Final   Cryptococcus neoformans/gattii NOT DETECTED NOT DETECTED Final   Methicillin resistance mecA/C DETECTED (A) NOT DETECTED Final    Comment: CRITICAL RESULT CALLED TO, READ BACK BY AND VERIFIED WITH: PHARMD J. LEDFORD 03/10/23 @ 0606 BY AB Performed at [facility], [address] [place], [place] [address]   Blood culture (routine x 2)     Status: None   Collection Time: 03/09/23  8:41 AM   Specimen: BLOOD RIGHT ARM  Result Value Ref Range Status   Specimen Description BLOOD RIGHT ARM  Final   Special Requests   Final    BOTTLES  DRAWN AEROBIC AND ANAEROBIC Blood Culture adequate volume   Culture   Final    NO GROWTH 5 DAYS Performed at [facility], [address], [place], [place] [address]    Report Status 03/14/2023 FINAL  Final  Culture, Respiratory w Gram Stain     Status: None   Collection Time: 03/09/23  3:46 PM   Specimen: Tracheal Aspirate  Result Value Ref Range Status   Specimen Description TRACHEAL ASPIRATE  Final   Special Requests NONE  Final   Gram Stain   Final    NO WBC SEEN FEW BUDDING YEAST SEEN Performed at [facility], [address], [place], [place] [address]    Culture FEW CANDIDA ALBICANS FEW CANDIDA GLABRATA   Final   Report Status 03/12/2023 FINAL  Final  MRSA Next Gen by PCR, Nasal     Status: None   Collection Time: 03/09/23  8:29 PM   Specimen: Nasal Mucosa; Nasal Swab  Result Value Ref Range Status   MRSA by PCR Next Gen NOT DETECTED NOT DETECTED Final    Comment: (NOTE)  The GeneXpert MRSA Assay (FDA approved for NASAL specimens only), is one component of a comprehensive MRSA colonization surveillance program. It is not intended to diagnose MRSA infection nor to guide or monitor treatment for MRSA infections. Test performance is not FDA approved in patients less than 47 years old. Performed at [facility], [address], [place], [place] [address]      Labs: Basic Metabolic Panel: Recent Labs  Lab 03/12/23 0411 03/13/23 1207 03/14/23 0336 03/15/23 0433 03/16/23 0345  NA 142 144 143 141 142  K 3.4* 3.0* 2.6* 3.9 3.6  CL 105 103 100 103 103  CO2 26 29 29  33* 32  GLUCOSE 99 137* 187* 209* 238*  BUN 12 15 17 18 14   CREATININE 0.58 0.63 0.65 0.66 0.68  CALCIUM 8.9 9.1 8.8* 8.7* 8.7*  MG 1.7  --  1.9 2.2 1.9   Liver Function Tests: Recent Labs  Lab 03/10/23 0958 03/12/23 0411 03/14/23 0336  AST 112* 67* 42*  ALT 137* 84* 55*  ALKPHOS 83 79 82  BILITOT 0.4 0.5 0.3  PROT 7.0 6.4* 6.3*  ALBUMIN 2.4* 2.2* 2.0*   CBC: Recent Labs   Lab 03/12/23 0411 03/13/23 1207 03/14/23 0336 03/15/23 0433 03/16/23 0345  WBC 22.3* 12.9* 14.2* 8.8 9.9  HGB 7.7* 8.0* 7.2* 7.1* 7.5*  HCT 24.1* 26.0* 23.1* 23.2* 23.4*  MCV 100.0 98.9 97.1 99.1 99.2  PLT 302 295 279 267 276   CBG: Recent Labs  Lab 03/15/23 1123 03/15/23 1702 03/15/23 2341 03/16/23 0551 03/16/23 1145  GLUCAP 188* 222* 213* 220* 239*   Hgb A1c No results for input(s): "HGBA1C" in the last 72 hours. Lipid Profile No results for input(s): "CHOL", "HDL", "LDLCALC", "TRIG", "CHOLHDL", "LDLDIRECT" in the last 72 hours. Thyroid function studies No results for input(s): "TSH", "T4TOTAL", "T3FREE", "THYROIDAB" in the last 72 hours.  Invalid input(s): "FREET3" Urinalysis    Component Value Date/Time   COLORURINE YELLOW 03/09/2023 2030   APPEARANCEUR CLEAR 03/09/2023 2030   LABSPEC 1.036 (H) 03/09/2023 2030   PHURINE 5.0 03/09/2023 2030   GLUCOSEU NEGATIVE 03/09/2023 2030   HGBUR NEGATIVE 03/09/2023 2030   BILIRUBINUR NEGATIVE 03/09/2023 2030   KETONESUR NEGATIVE 03/09/2023 2030   PROTEINUR 100 (A) 03/09/2023 2030   NITRITE NEGATIVE 03/09/2023 2030   LEUKOCYTESUR NEGATIVE 03/09/2023 2030    FURTHER DISCHARGE INSTRUCTIONS:   Get Medicines reviewed and adjusted: Please take all your medications with you for your next visit with your Primary MD   Laboratory/radiological data: Please request your Primary MD to go over all hospital tests and procedure/radiological results at the follow up, please ask your Primary MD to get all Hospital records sent to his/her office.   In some cases, they will be blood work, cultures and biopsy results pending at the time of your discharge. Please request that your primary care M.D. goes through all the records of your hospital data and follows up on these results.   Also Note the following: If you experience worsening of your admission symptoms, develop shortness of breath, life threatening emergency, suicidal or  homicidal thoughts you must seek medical attention immediately by calling 911 or calling your MD immediately  if symptoms less severe.   You must read complete instructions/literature along with all the possible adverse reactions/side effects for all the Medicines you take and that have been prescribed to you. Take any new Medicines after you have completely understood and accpet all the possible adverse reactions/side effects.    Do  not drive when taking Pain medications or sleeping medications (Benzodaizepines)   Do not take more than prescribed Pain, Sleep and Anxiety Medications. It is not advisable to combine anxiety,sleep and pain medications without talking with your primary care practitioner   Special Instructions: If you have smoked or chewed Tobacco  in the last 2 yrs please stop smoking, stop any regular Alcohol  and or any Recreational drug use.   Wear Seat belts while driving.   Please note: You were cared for by a hospitalist during your hospital stay. Once you are discharged, your primary care physician will handle any further medical issues. Please note that NO REFILLS for any discharge medications will be authorized once you are discharged, as it is imperative that you return to your primary care physician (or establish a relationship with a primary care physician if you do not have one) for your post hospital discharge needs so that they can reassess your need for medications and monitor your lab values.  Time coordinating discharge: 40 minutes  SIGNED:  Pamella Pert, MD, PhD 03/16/2023, 12:36 PM

## 2023-03-16 NOTE — Plan of Care (Signed)
  Problem: Clinical Measurements: Goal: Diagnostic test results will improve Outcome: Progressing Goal: Signs and symptoms of infection will decrease Outcome: Progressing   Problem: Fluid Volume: Goal: Hemodynamic stability will improve Outcome: Progressing   Problem: Skin Integrity: Goal: Demonstration of wound healing without infection will improve Outcome: Progressing

## 2023-03-16 NOTE — Inpatient Diabetes Management (Signed)
Inpatient Diabetes Program Recommendations  AACE/ADA: New Consensus Statement on Inpatient Glycemic Control (2015)  Target Ranges:  Prepandial:   less than 140 mg/dL      Peak postprandial:   less than 180 mg/dL (1-2 hours)      Critically ill patients:  140 - 180 mg/dL    Latest Reference Range & Units 03/14/23 23:27 03/15/23 06:03 03/15/23 11:23 03/15/23 17:02  Glucose-Capillary 70 - 99 mg/dL 098 (H)  3 units Novolog  209 (H)  5 units Novolog  188 (H)  3 units Novolog  222 (H)  5 units Novolog   (H): Data is abnormally high  Latest Reference Range & Units 03/15/23 23:41 03/16/23 05:51  Glucose-Capillary 70 - 99 mg/dL 119 (H)  5 units Novolog  220 (H)  5 units Novolog   (H): Data is abnormally high   Admit with: Sepsis due to pneumonia   History: DM2  Home DM Meds: Humalog 0-16 units QID per SSI        Metformin 500 mg BID  Current Orders: Novolog Moderate Correction Scale/ SSI (0-15 units) Q6 hours    MD- Note pt getting Tube Feeds 40cc/hr.  CBGs >200  Please consider:  1. Change Novolog SSI frequency to Q4 hours  2. Start low dose Novolog Tube Feed Coverage: Novolog 3 units Q4 hours  HOLD if tube feeds HELD for any reason     --Will follow patient during hospitalization--  Ambrose Finland RN, MSN, CDCES Diabetes Coordinator Inpatient Glycemic Control Team Team Pager: [phone] (8a-5p)

## 2023-03-18 DIAGNOSIS — I10 Essential (primary) hypertension: Secondary | ICD-10-CM | POA: Diagnosis not present

## 2023-03-18 DIAGNOSIS — G4089 Other seizures: Secondary | ICD-10-CM | POA: Diagnosis not present

## 2023-03-18 DIAGNOSIS — E1165 Type 2 diabetes mellitus with hyperglycemia: Secondary | ICD-10-CM | POA: Diagnosis not present

## 2023-03-18 DIAGNOSIS — A419 Sepsis, unspecified organism: Secondary | ICD-10-CM | POA: Diagnosis not present

## 2023-03-20 DIAGNOSIS — R1111 Vomiting without nausea: Secondary | ICD-10-CM | POA: Diagnosis not present

## 2023-03-20 DIAGNOSIS — R197 Diarrhea, unspecified: Secondary | ICD-10-CM | POA: Diagnosis not present

## 2023-03-20 LAB — URINE CULTURE: Culture: >=100000 — AB

## 2023-03-23 DIAGNOSIS — Z515 Encounter for palliative care: Secondary | ICD-10-CM | POA: Diagnosis not present

## 2023-03-29 DIAGNOSIS — R197 Diarrhea, unspecified: Secondary | ICD-10-CM | POA: Diagnosis not present

## 2023-03-29 DIAGNOSIS — S065XAD Traumatic subdural hemorrhage with loss of consciousness status unknown, subsequent encounter: Secondary | ICD-10-CM | POA: Diagnosis not present

## 2023-03-29 DIAGNOSIS — J690 Pneumonitis due to inhalation of food and vomit: Secondary | ICD-10-CM | POA: Diagnosis not present

## 2023-03-29 DIAGNOSIS — I69220 Aphasia following other nontraumatic intracranial hemorrhage: Secondary | ICD-10-CM | POA: Diagnosis not present

## 2023-03-29 DIAGNOSIS — G4089 Other seizures: Secondary | ICD-10-CM | POA: Diagnosis not present

## 2023-04-01 DIAGNOSIS — R1111 Vomiting without nausea: Secondary | ICD-10-CM | POA: Diagnosis not present

## 2023-04-01 DIAGNOSIS — J690 Pneumonitis due to inhalation of food and vomit: Secondary | ICD-10-CM | POA: Diagnosis not present

## 2023-04-01 DIAGNOSIS — E1165 Type 2 diabetes mellitus with hyperglycemia: Secondary | ICD-10-CM | POA: Diagnosis not present

## 2023-04-02 ENCOUNTER — Other Ambulatory Visit (HOSPITAL_COMMUNITY): Payer: Self-pay | Admitting: Adult Medicine

## 2023-04-02 DIAGNOSIS — Z789 Other specified health status: Secondary | ICD-10-CM

## 2023-04-03 DIAGNOSIS — R197 Diarrhea, unspecified: Secondary | ICD-10-CM | POA: Diagnosis not present

## 2023-04-03 DIAGNOSIS — R1111 Vomiting without nausea: Secondary | ICD-10-CM | POA: Diagnosis not present

## 2023-04-08 DIAGNOSIS — Z515 Encounter for palliative care: Secondary | ICD-10-CM | POA: Diagnosis not present

## 2023-04-10 DIAGNOSIS — R1111 Vomiting without nausea: Secondary | ICD-10-CM | POA: Diagnosis not present

## 2023-04-10 DIAGNOSIS — R0989 Other specified symptoms and signs involving the circulatory and respiratory systems: Secondary | ICD-10-CM | POA: Diagnosis not present

## 2023-04-13 DIAGNOSIS — J189 Pneumonia, unspecified organism: Secondary | ICD-10-CM | POA: Diagnosis not present

## 2023-04-13 DIAGNOSIS — I69354 Hemiplegia and hemiparesis following cerebral infarction affecting left non-dominant side: Secondary | ICD-10-CM | POA: Diagnosis not present

## 2023-04-13 DIAGNOSIS — M6281 Muscle weakness (generalized): Secondary | ICD-10-CM | POA: Diagnosis not present

## 2023-04-15 DIAGNOSIS — Z515 Encounter for palliative care: Secondary | ICD-10-CM | POA: Diagnosis not present

## 2023-04-16 ENCOUNTER — Other Ambulatory Visit (HOSPITAL_COMMUNITY): Payer: Self-pay | Admitting: Adult Medicine

## 2023-04-16 ENCOUNTER — Ambulatory Visit (HOSPITAL_COMMUNITY)
Admission: RE | Admit: 2023-04-16 | Discharge: 2023-04-16 | Disposition: A | Discharge status: Home / Self Care | Payer: Medicare HMO | Source: Ambulatory Visit | Attending: Adult Medicine | Admitting: Adult Medicine

## 2023-04-16 DIAGNOSIS — Z931 Gastrostomy status: Secondary | ICD-10-CM | POA: Diagnosis not present

## 2023-04-16 DIAGNOSIS — Z789 Other specified health status: Secondary | ICD-10-CM | POA: Diagnosis not present

## 2023-04-22 DIAGNOSIS — J189 Pneumonia, unspecified organism: Secondary | ICD-10-CM | POA: Diagnosis not present

## 2023-04-22 DIAGNOSIS — E1122 Type 2 diabetes mellitus with diabetic chronic kidney disease: Secondary | ICD-10-CM | POA: Diagnosis not present

## 2023-04-22 DIAGNOSIS — D509 Iron deficiency anemia, unspecified: Secondary | ICD-10-CM | POA: Diagnosis not present

## 2023-04-22 DIAGNOSIS — F0631 Mood disorder due to known physiological condition with depressive features: Secondary | ICD-10-CM | POA: Diagnosis not present

## 2023-04-23 ENCOUNTER — Encounter: Payer: Self-pay | Admitting: Neurology

## 2023-04-23 ENCOUNTER — Telehealth: Payer: Self-pay

## 2023-04-23 ENCOUNTER — Ambulatory Visit (INDEPENDENT_AMBULATORY_CARE_PROVIDER_SITE_OTHER): Payer: Medicare HMO | Admitting: Neurology

## 2023-04-23 VITALS — BP 125/73 | HR 68 | Ht 64.0 in

## 2023-04-23 DIAGNOSIS — Z9889 Other specified postprocedural states: Secondary | ICD-10-CM

## 2023-04-23 DIAGNOSIS — Z8679 Personal history of other diseases of the circulatory system: Secondary | ICD-10-CM

## 2023-04-23 DIAGNOSIS — G8114 Spastic hemiplegia affecting left nondominant side: Secondary | ICD-10-CM | POA: Insufficient documentation

## 2023-04-23 DIAGNOSIS — I611 Nontraumatic intracerebral hemorrhage in hemisphere, cortical: Secondary | ICD-10-CM | POA: Diagnosis not present

## 2023-04-23 DIAGNOSIS — R569 Unspecified convulsions: Secondary | ICD-10-CM

## 2023-04-23 DIAGNOSIS — I69152 Hemiplegia and hemiparesis following nontraumatic intracerebral hemorrhage affecting left dominant side: Secondary | ICD-10-CM

## 2023-04-23 NOTE — Progress Notes (Signed)
Chief Complaint  Patient presents with   Follow-up    Rm 14. Accompanied by daughter. Referral from Dr. Ottis Stain for spasticity.      ASSESSMENT AND PLAN  [name] is a 87 y.o. female   Right frontal intraparenchymal hemorrhage in January 2024, with residual spastic left hemiparesis, Complex partial seizure  No recurrent seizure taking Keppra 500 mg twice a day  Painful left upper extremity, shoulder upon passive stretch,  Prior authorization for xeomin 300 units,  Return to clinic in 1 months for electrical stimulation guided injection,    DIAGNOSTIC DATA (LABS, IMAGING, TESTING) - I reviewed patient records, labs, notes, testing and imaging myself where available.   MEDICAL HISTORY:  Gina Kelley is a 87 year old female, seen in request by Dr. Pearlean Brownie for evaluation of botulism injection for spastic left hemiparesis, she is accompanied by her daughter at today's visit April 23, 2023  History is obtained from the patient and review of electronic medical records. I personally reviewed pertinent available imaging films in PACS.   PMHx of   She suffered large size right frontal intraparenchymal hemorrhage in January 2024, significant decline in functional status since then, now at nursing home, with left hemiparesis, dysarthria, dysphagia, nonambulatory, came in with stretcher today  She complains of pain with passive stretch of left arm, difficulty putting on cloth, hope to receive relief,  Reviewed CT head in January 2024, large acute intraparenchymal hematoma within the right frontal region, measuring 4.0 x 3.9 x 4.1 cm, with associated right frontal parietal subdural hematoma 12 mm leftward midline shift  MRI of the brain September 2024, sequelae of right hemisphere hemorrhage, craniectomy with extensive encephalomalacia, superficial siderosis, bilateral renal degeneration, chronic small vessel disease at left hemisphere, brainstem,     PHYSICAL  EXAM:   Vitals:   04/23/23 1005  BP: 125/73  Pulse: 68  Height: 5\' 4"  (1.626 m)   Not recorded     Body mass index is 20.36 kg/m.  PHYSICAL EXAMNIATION:  Gen: NAD, conversant, well nourised, well groomed                     Cardiovascular: Regular rate rhythm, no peripheral edema, warm, nontender. Eyes: Conjunctivae clear without exudates or hemorrhage Neck: Supple, no carotid bruits. Pulmonary: Clear to auscultation bilaterally   NEUROLOGICAL EXAM:  MENTAL STATUS: Speech/cognition: In stretcher, severe dysarthria, cooperative on examination, CRANIAL NERVES: CN II: Visual fields are full to confrontation. Pupils are round equal and briskly reactive to light. CN III, IV, VI: extraocular movement are normal. No ptosis. CN V: Facial sensation is intact to light touch CN VII: Left lower face weakness, CN VIII: Hearing is normal to causal conversation. CN IX, X: Phonation is normal. CN XI: Head turning and shoulder shrug are intact  MOTOR: Spastic left hemiparesis, no antigravity movement of left upper or lower extremity, tight left pectoralis muscles, complains of pain on passive stretch, limited range of motion of left shoulder, tendency for left finger flexion,  REFLEXES: Hyperreflexia of left upper and lower extremity  SENSORY: Left hemineglect  COORDINATION: There is no trunk or limb dysmetria noted.  GAIT/STANCE: Deferred  REVIEW OF SYSTEMS:  Full 14 system review of systems performed and notable only for as above All other review of systems were negative.   ALLERGIES: Allergies  Allergen Reactions   Cozaar [Losartan] Swelling    Angioedema    Periogard [Chlorhexidine Gluconate] Swelling and Other (See Comments)    Skin irritation Eye/tongue/lip  swelling   Pork Allergy Other (See Comments)    Unknown reaction   Vasotec [Enalapril] Cough    HOME MEDICATIONS: Current Outpatient Medications  Medication Sig Dispense Refill   acetaminophen (TYLENOL) 500  MG tablet Place 500 mg into feeding tube 4 (four) times daily as needed for mild pain or moderate pain.     acetaminophen (TYLENOL) 500 MG tablet Place 1,000 mg into feeding tube in the morning and at bedtime.     amLODipine (NORVASC) 10 MG tablet Place 1 tablet (10 mg total) into feeding tube daily.     ascorbic acid (VITAMIN C) 500 MG tablet Place 1 tablet (500 mg total) into feeding tube daily.     atorvastatin (LIPITOR) 80 MG tablet Place 1 tablet (80 mg total) into feeding tube daily.     carvedilol (COREG) 6.25 MG tablet Place 1 tablet (6.25 mg total) into feeding tube 2 (two) times daily with a meal.     ezetimibe (ZETIA) 10 MG tablet Place 1 tablet (10 mg total) into feeding tube daily.     famotidine (PEPCID) 20 MG tablet Place 1 tablet (20 mg total) into feeding tube 2 (two) times daily.     FLUoxetine (PROZAC) 20 MG capsule Place 1 capsule (20 mg total) into feeding tube at bedtime.  3   gabapentin (NEURONTIN) 250 MG/5ML solution Place 2 mLs (100 mg total) into feeding tube every 8 (eight) hours.  12   guaiFENesin (ROBITUSSIN) 100 MG/5ML liquid Place 200 mg into feeding tube 4 (four) times daily.     insulin lispro (HUMALOG) 100 UNIT/ML KwikPen Inject 0-16 Units into the skin See admin instructions. Inject 0-15 units 4 times daily per sliding scale: < 69 : implement hypoglycemic protocol 70-150 : 0 units 151-200 : 3 units 201-250 : 6 units 251-300 : 9 units 301-350 : 12 units 351-400 : 16 units > 401 : call MD     ipratropium-albuterol (DUONEB) 0.5-2.5 (3) MG/3ML SOLN Take 3 mLs by nebulization every 4 (four) hours as needed.     l-methylfolate-B6-B12 (METANX) 3-35-2 MG TABS tablet Place 1 tablet into feeding tube daily. 60 tablet    levETIRAcetam (KEPPRA) 100 MG/ML solution Place 5 mLs (500 mg total) into feeding tube 2 (two) times daily. 473 mL 12   lidocaine 4 % Place 1 patch onto the skin daily.     loperamide (IMODIUM A-D) 2 MG tablet Take 2 mg by mouth 4 (four) times daily as  needed for diarrhea or loose stools.     Nutritional Supplements (FEEDING SUPPLEMENT, GLUCERNA 1.5 CAL,) LIQD Place 1,000 mLs into feeding tube daily. (Patient taking differently: Place 40 mL/hr into feeding tube daily.)     metFORMIN (GLUCOPHAGE) 500 MG tablet Place 1 tablet (500 mg total) into feeding tube 2 (two) times daily with a meal. (Patient not taking: Reported on 04/23/2023)     Multiple Vitamins-Minerals (PRESERVISION AREDS 2) CHEW Give 1 each by tube 2 (two) times daily. (Patient not taking: Reported on 04/23/2023)     ondansetron (ZOFRAN) 4 MG tablet Take 4 mg by mouth every 8 (eight) hours as needed for nausea or vomiting. (Patient not taking: Reported on 04/23/2023)     pantoprazole (PROTONIX) 2 mg/mL suspension Place 40 mg into feeding tube daily. (Patient not taking: Reported on 04/23/2023)     Water For Irrigation, Sterile (FREE WATER) SOLN Place 120 mLs into feeding tube every 4 (four) hours. (Patient not taking: Reported on 04/23/2023)     No current  facility-administered medications for this visit.    PAST MEDICAL HISTORY: Past Medical History:  Diagnosis Date   Anemia of chronic disease 07/08/2006   Antral ulcer 02/23/2007   Seen on EGD in 2008, small ulcer with erosion    Cerebral vascular accident (HCC) 04/14/2006   1998, left lower extremity numbness, no residual deficits    Essential hypertension 04/14/2006   Gastroesophageal reflux disease 02/18/2013   Occasional, symptomatically relieved with peptobismol    Healthcare maintenance 12/04/2011   Hyperlipidemia LDL goal < 100 04/14/2006   Hypertensive retinopathy of both eyes, grade 1 05/15/2016   Osteopenia of right femoral neck 10/20/2016   DEXA (10/16/2016): R femur T -2.5 (FRAX tool calculates at -2.4), L1-L4 spine T -0.9, 10 year risk for: Major osteoporotic fracture 8.3%, Hip fracture 2.7%   Seizure disorder (HCC) 03/01/2023   Type 2 diabetes mellitus with stage 1 chronic kidney disease, without long-term  current use of insulin (HCC)    Type II diabetes mellitus (HCC) 04/14/2006    PAST SURGICAL HISTORY: Past Surgical History:  Procedure Laterality Date   COLONOSCOPY     CRANIOTOMY Right 07/08/2022   Procedure: RIGHT CRANIOTOMY HEMATOMA EVACUATION SUBDURAL;  Surgeon: Julio Sicks, MD;  Location: [facility] OR;  Service: Neurosurgery;  Laterality: Right;   ESOPHAGOGASTRODUODENOSCOPY     ESOPHAGOGASTRODUODENOSCOPY (EGD) WITH PROPOFOL N/A 08/01/2022   Procedure: ESOPHAGOGASTRODUODENOSCOPY (EGD) WITH PROPOFOL;  Surgeon: Diamantina Monks, MD;  Location: [facility] ENDOSCOPY;  Service: General;  Laterality: N/A;   PEG PLACEMENT N/A 08/01/2022   Procedure: PERCUTANEOUS ENDOSCOPIC GASTROSTOMY (PEG) PLACEMENT;  Surgeon: Diamantina Monks, MD;  Location: [facility] ENDOSCOPY;  Service: General;  Laterality: N/A;    FAMILY HISTORY: Family History  Problem Relation Age of Onset   Stroke Mother    Diabetes Mother    Hypertension Mother    Anuerysm Father 64       Cerebral   Asthma Sister    Breast cancer Sister    Pulmonary disease Brother        Black lung   Stroke Sister    Cirrhosis Brother    Hypertension Son    Asthma Brother     SOCIAL HISTORY: Social History   Socioeconomic History   Marital status: Married    Spouse name: Not on file   Number of children: Not on file   Years of education: Not on file   Highest education level: Not on file  Occupational History   Occupation: Retired     Comment: [employer]  Tobacco Use   Smoking status: Former    Current packs/day: 0.00    Types: Cigarettes    Quit date: 06/30/1978    Years since quitting: 44.8   Smokeless tobacco: Never  Vaping Use   Vaping status: Never Used  Substance and Sexual Activity   Alcohol use: No   Drug use: No   Sexual activity: Not Currently  Other Topics Concern   Not on file  Social History Narrative   Current Social History 09/07/2020        Patient lives with husband in one level home with 5 outside steps with handrails on  both sides        Patient's method of transportation is personal car (drives herself)      The highest level of education was 9 th grade      The patient currently retired from [employer] (due to [employer]).      Identified important Relationships are, "My family"  Pets : None       Interests / Fun: "Go to [employer], sing, read. Went to gym before [employer]."       Current Stressors: "I don't have any. 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Ducatte, BSN, RN-BC       Social Determinants of Health   Financial Resource Strain: Not on file  Food Insecurity: No Food Insecurity (03/02/2023)   Hunger Vital Sign    Worried About Running Out of Food in the Last Year: Never true    Ran Out of Food in the Last Year: Never true  Transportation Needs: No Transportation Needs (03/02/2023)   PRAPARE - Administrator, Civil Service (Medical): No    Lack of Transportation (Non-Medical): No  Physical Activity: Not on file  Stress: Not on file  Social Connections: Not on file  Intimate Partner Violence: Patient Unable To Answer (03/02/2023)   Humiliation, Afraid, Rape, and Kick questionnaire    Fear of Current or Ex-Partner: Patient unable to answer    Emotionally Abused: Patient unable to answer    Physically Abused: Patient unable to answer    Sexually Abused: Patient unable to answer      Levert Feinstein, M.D. Ph.D.  [facility] [address] [place], [place] [address] Ph: [phone] Fax: [phone]  CC:  Micki Riley, MD [address] [place],  [place] [address]  Patient, No Pcp Per

## 2023-04-23 NOTE — Telephone Encounter (Signed)
G81.14

## 2023-04-23 NOTE — Telephone Encounter (Signed)
What diagnosis code would you like to use?

## 2023-04-23 NOTE — Telephone Encounter (Signed)
Xeomin [id number]  [id number] ELECTRICAL STIMULATION  300 UNITS  CHEMICAL DENERVATION OF LIMBS/TRUNK MUSCLES 64642 1-4 LIMB MUSCLES 64643 ADDITIONAL 1-4 LIMB MUSCLES 64644 5 OR  LIMB MUSCLES 64645 EACH ADDITIONAL 5 OR MORE (LIMB MUSCLES) 64646 TRUNK MUSCLES (ERECTOR SPINAE OR PARA RECTUS) 1-5 MUSCLES 64647 6 OR MORE MUSCLES

## 2023-04-24 ENCOUNTER — Telehealth: Payer: Self-pay | Admitting: Neurology

## 2023-04-24 DIAGNOSIS — I69152 Hemiplegia and hemiparesis following nontraumatic intracerebral hemorrhage affecting left dominant side: Secondary | ICD-10-CM | POA: Insufficient documentation

## 2023-04-24 NOTE — Telephone Encounter (Signed)
Camille from [name] is asking to be called regarding the scheduling and any other aspects of the Xeomin injection for pt

## 2023-04-27 ENCOUNTER — Observation Stay (HOSPITAL_COMMUNITY)
Admission: EM | Admit: 2023-04-27 | Discharge: 2023-05-01 | Disposition: A | Discharge status: Skilled Nursing Facility | Payer: Medicare HMO | Attending: Infectious Diseases | Admitting: Infectious Diseases

## 2023-04-27 ENCOUNTER — Encounter (HOSPITAL_COMMUNITY): Payer: Self-pay | Admitting: Infectious Diseases

## 2023-04-27 ENCOUNTER — Other Ambulatory Visit: Payer: Self-pay

## 2023-04-27 DIAGNOSIS — R2689 Other abnormalities of gait and mobility: Secondary | ICD-10-CM | POA: Insufficient documentation

## 2023-04-27 DIAGNOSIS — R531 Weakness: Secondary | ICD-10-CM | POA: Diagnosis present

## 2023-04-27 DIAGNOSIS — R770 Abnormality of albumin: Secondary | ICD-10-CM | POA: Diagnosis not present

## 2023-04-27 DIAGNOSIS — G40909 Epilepsy, unspecified, not intractable, without status epilepticus: Secondary | ICD-10-CM | POA: Insufficient documentation

## 2023-04-27 DIAGNOSIS — E785 Hyperlipidemia, unspecified: Secondary | ICD-10-CM | POA: Diagnosis present

## 2023-04-27 DIAGNOSIS — Z79899 Other long term (current) drug therapy: Secondary | ICD-10-CM | POA: Insufficient documentation

## 2023-04-27 DIAGNOSIS — Z794 Long term (current) use of insulin: Secondary | ICD-10-CM | POA: Diagnosis not present

## 2023-04-27 DIAGNOSIS — E1122 Type 2 diabetes mellitus with diabetic chronic kidney disease: Secondary | ICD-10-CM | POA: Diagnosis not present

## 2023-04-27 DIAGNOSIS — Z87891 Personal history of nicotine dependence: Secondary | ICD-10-CM | POA: Diagnosis not present

## 2023-04-27 DIAGNOSIS — R2681 Unsteadiness on feet: Secondary | ICD-10-CM | POA: Insufficient documentation

## 2023-04-27 DIAGNOSIS — M6281 Muscle weakness (generalized): Secondary | ICD-10-CM | POA: Insufficient documentation

## 2023-04-27 DIAGNOSIS — I10 Essential (primary) hypertension: Secondary | ICD-10-CM | POA: Diagnosis not present

## 2023-04-27 DIAGNOSIS — N181 Chronic kidney disease, stage 1: Secondary | ICD-10-CM | POA: Insufficient documentation

## 2023-04-27 DIAGNOSIS — I129 Hypertensive chronic kidney disease with stage 1 through stage 4 chronic kidney disease, or unspecified chronic kidney disease: Secondary | ICD-10-CM | POA: Diagnosis not present

## 2023-04-27 DIAGNOSIS — R6889 Other general symptoms and signs: Secondary | ICD-10-CM | POA: Diagnosis not present

## 2023-04-27 DIAGNOSIS — K922 Gastrointestinal hemorrhage, unspecified: Secondary | ICD-10-CM | POA: Diagnosis not present

## 2023-04-27 DIAGNOSIS — D649 Anemia, unspecified: Secondary | ICD-10-CM | POA: Diagnosis not present

## 2023-04-27 DIAGNOSIS — Z7984 Long term (current) use of oral hypoglycemic drugs: Secondary | ICD-10-CM | POA: Diagnosis not present

## 2023-04-27 DIAGNOSIS — E119 Type 2 diabetes mellitus without complications: Secondary | ICD-10-CM | POA: Diagnosis not present

## 2023-04-27 DIAGNOSIS — E43 Unspecified severe protein-calorie malnutrition: Secondary | ICD-10-CM | POA: Diagnosis not present

## 2023-04-27 DIAGNOSIS — D62 Acute posthemorrhagic anemia: Secondary | ICD-10-CM | POA: Diagnosis not present

## 2023-04-27 DIAGNOSIS — I158 Other secondary hypertension: Secondary | ICD-10-CM

## 2023-04-27 DIAGNOSIS — K219 Gastro-esophageal reflux disease without esophagitis: Secondary | ICD-10-CM | POA: Diagnosis present

## 2023-04-27 LAB — COMPREHENSIVE METABOLIC PANEL
ALT: 34 U/L (ref 0–44)
AST: 62 U/L — ABNORMAL HIGH (ref 15–41)
Albumin: 2 g/dL — ABNORMAL LOW (ref 3.5–5.0)
Alkaline Phosphatase: 67 U/L (ref 38–126)
Anion gap: 10 (ref 5–15)
BUN: 20 mg/dL (ref 8–23)
CO2: 27 mmol/L (ref 22–32)
Calcium: 8.9 mg/dL (ref 8.9–10.3)
Chloride: 97 mmol/L — ABNORMAL LOW (ref 98–111)
Creatinine, Ser: 0.7 mg/dL (ref 0.44–1.00)
GFR, Estimated: >60 mL/min (ref >60)
Glucose, Bld: 83 mg/dL (ref 70–99)
Potassium: 4.1 mmol/L (ref 3.5–5.1)
Sodium: 134 mmol/L — ABNORMAL LOW (ref 135–145)
Total Bilirubin: 0.5 mg/dL (ref 0.3–1.2)
Total Protein: 7.2 g/dL (ref 6.5–8.1)

## 2023-04-27 LAB — CBC WITH DIFFERENTIAL/PLATELET
Abs Immature Granulocytes: 0.01 10*3/uL (ref 0.00–0.07)
Basophils Absolute: 0 10*3/uL (ref 0.0–0.1)
Basophils Relative: 0 %
Eosinophils Absolute: 0.2 10*3/uL (ref 0.0–0.5)
Eosinophils Relative: 3 %
HCT: 21.1 % — ABNORMAL LOW (ref 36.0–46.0)
Hemoglobin: 6.4 g/dL — CL (ref 12.0–15.0)
Immature Granulocytes: 0 %
Lymphocytes Relative: 20 %
Lymphs Abs: 1.2 10*3/uL (ref 0.7–4.0)
MCH: 27.9 pg (ref 26.0–34.0)
MCHC: 30.3 g/dL (ref 30.0–36.0)
MCV: 92.1 fL (ref 80.0–100.0)
Monocytes Absolute: 0.8 10*3/uL (ref 0.1–1.0)
Monocytes Relative: 14 %
Neutro Abs: 3.6 10*3/uL (ref 1.7–7.7)
Neutrophils Relative %: 63 %
Platelets: 435 10*3/uL — ABNORMAL HIGH (ref 150–400)
RBC: 2.29 MIL/uL — ABNORMAL LOW (ref 3.87–5.11)
RDW: 17.3 % — ABNORMAL HIGH (ref 11.5–15.5)
WBC: 5.7 10*3/uL (ref 4.0–10.5)
nRBC: 0 % (ref 0.0–0.2)

## 2023-04-27 LAB — APTT: aPTT: 24 s (ref 24–36)

## 2023-04-27 LAB — PROTIME-INR
INR: 1.1 (ref 0.8–1.2)
Prothrombin Time: 14.8 s (ref 11.4–15.2)

## 2023-04-27 LAB — PREPARE RBC (CROSSMATCH)

## 2023-04-27 LAB — POC OCCULT BLOOD, ED: Fecal Occult Bld: NEGATIVE

## 2023-04-27 MED ORDER — PANTOPRAZOLE SODIUM 40 MG IV SOLR
40.0000 mg | INTRAVENOUS | Status: DC
  Administered 2023-04-28 – 2023-04-30 (×3): 40 mg via INTRAVENOUS
  Filled 2023-04-27 (×3): qty 10

## 2023-04-27 MED ORDER — ACETAMINOPHEN 325 MG PO TABS
650.0000 mg | ORAL_TABLET | Freq: Four times a day (QID) | ORAL | Status: DC | PRN
  Administered 2023-04-27: 650 mg
  Filled 2023-04-27: qty 2

## 2023-04-27 MED ORDER — FAMOTIDINE 20 MG PO TABS
20.0000 mg | ORAL_TABLET | Freq: Two times a day (BID) | ORAL | Status: DC
Start: 2023-04-27 — End: 2023-04-30
  Administered 2023-04-27 – 2023-04-29 (×5): 20 mg
  Filled 2023-04-27 (×5): qty 1

## 2023-04-27 MED ORDER — METFORMIN HCL 500 MG PO TABS
500.0000 mg | ORAL_TABLET | Freq: Two times a day (BID) | ORAL | Status: DC
  Administered 2023-04-28 – 2023-04-30 (×5): 500 mg
  Filled 2023-04-27 (×6): qty 1

## 2023-04-27 MED ORDER — GLUCERNA 1.5 CAL PO LIQD
40.0000 mL/h | ORAL | Status: DC
Start: 2023-04-28 — End: 2023-04-27

## 2023-04-27 MED ORDER — GLUCERNA 1.5 CAL PO LIQD
40.0000 mL/h | ORAL | Status: DC
  Administered 2023-04-28 – 2023-04-29 (×3): 40 mL/h
  Filled 2023-04-27 (×4): qty 1000

## 2023-04-27 MED ORDER — FLUOXETINE HCL 20 MG/5ML PO SOLN
20.0000 mg | Freq: Every day | ORAL | Status: DC
Start: 2023-04-27 — End: 2023-05-01
  Administered 2023-04-27 – 2023-04-29 (×3): 20 mg
  Filled 2023-04-27 (×4): qty 5

## 2023-04-27 MED ORDER — EZETIMIBE 10 MG PO TABS
10.0000 mg | ORAL_TABLET | Freq: Every day | ORAL | Status: DC
  Administered 2023-04-28 – 2023-04-30 (×3): 10 mg
  Filled 2023-04-27 (×3): qty 1

## 2023-04-27 MED ORDER — LIDOCAINE 5 % EX PTCH
2.0000 | MEDICATED_PATCH | Freq: Every day | CUTANEOUS | Status: DC
  Administered 2023-04-28 – 2023-04-30 (×3): 2 via TRANSDERMAL
  Filled 2023-04-27 (×3): qty 2

## 2023-04-27 MED ORDER — AMLODIPINE BESYLATE 10 MG PO TABS
10.0000 mg | ORAL_TABLET | Freq: Every day | ORAL | Status: DC
  Administered 2023-04-28 – 2023-04-30 (×3): 10 mg
  Filled 2023-04-27 (×3): qty 1

## 2023-04-27 MED ORDER — PANTOPRAZOLE SODIUM POWD: 1.0000 | Status: DC

## 2023-04-27 MED ORDER — GABAPENTIN 250 MG/5ML PO SOLN
100.0000 mg | Freq: Three times a day (TID) | ORAL | Status: DC
Start: 2023-04-27 — End: 2023-05-01
  Administered 2023-04-27 – 2023-04-30 (×8): 100 mg
  Filled 2023-04-27 (×12): qty 2

## 2023-04-27 MED ORDER — GUAIFENESIN 100 MG/5ML PO LIQD
100.0000 mg | Freq: Four times a day (QID) | ORAL | Status: DC
Start: 2023-04-27 — End: 2023-05-01
  Administered 2023-04-27 – 2023-04-30 (×10): 100 mg
  Filled 2023-04-27 (×11): qty 15

## 2023-04-27 MED ORDER — FREE WATER
120.0000 mL | Status: DC
Start: 2023-04-28 — End: 2023-05-01
  Administered 2023-04-28 – 2023-04-30 (×17): 120 mL

## 2023-04-27 MED ORDER — SODIUM CHLORIDE 0.9% IV SOLUTION: Freq: Once | INTRAVENOUS | Status: AC

## 2023-04-27 MED ORDER — VITAMIN C 500 MG PO TABS
500.0000 mg | ORAL_TABLET | Freq: Every day | ORAL | Status: DC
Start: 2023-04-28 — End: 2023-05-01
  Administered 2023-04-28 – 2023-04-30 (×3): 500 mg
  Filled 2023-04-27 (×3): qty 1

## 2023-04-27 MED ORDER — ATORVASTATIN CALCIUM 80 MG PO TABS
80.0000 mg | ORAL_TABLET | Freq: Every day | ORAL | Status: DC
  Administered 2023-04-28 – 2023-04-30 (×3): 80 mg
  Filled 2023-04-27 (×3): qty 1

## 2023-04-27 MED ORDER — ACETAMINOPHEN 650 MG RE SUPP: 650.0000 mg | Freq: Four times a day (QID) | RECTAL | Status: DC | PRN

## 2023-04-27 MED ORDER — LEVETIRACETAM 100 MG/ML PO SOLN
500.0000 mg | Freq: Two times a day (BID) | ORAL | Status: DC
Start: 2023-04-27 — End: 2023-05-01
  Administered 2023-04-27 – 2023-04-30 (×6): 500 mg
  Filled 2023-04-27 (×7): qty 5

## 2023-04-27 MED ORDER — CARVEDILOL 6.25 MG PO TABS
6.2500 mg | ORAL_TABLET | Freq: Two times a day (BID) | ORAL | Status: DC
Start: 2023-04-27 — End: 2023-05-01
  Administered 2023-04-27 – 2023-04-30 (×6): 6.25 mg
  Filled 2023-04-27 (×7): qty 1

## 2023-04-27 NOTE — Telephone Encounter (Signed)
Submitted auth request to [facility] via cmm, status is pending. Key: [id number]

## 2023-04-27 NOTE — Progress Notes (Signed)
Spoke with pt daughter, [name], about going w/o adult diapers but she said she prefers for her mother to use them.

## 2023-04-27 NOTE — H&P (Incomplete)
Date: 04/28/2023               Patient Name:  Gina Kelley MRN: [id number]  DOB: Oct 26, 1935 Age / Sex: 87 y.o., female   PCP: Patient, No Pcp Per         Medical Service: Internal Medicine Teaching Service         Attending Physician: Dr. Ninetta Lights Lacretia Leigh, MD      First Contact: Dr. Manuela Neptune, MD Pager [phone]    Second Contact: Dr. Morene Crocker, MD Pager [phone]         After Hours (After 5p/  First Contact Pager: [phone]  weekends / holidays): Second Contact Pager: [phone]   SUBJECTIVE   Chief Complaint: Weakness, low hemoglobin. History of Present Illness:   Gina Kelley 87 year old female with a history of subdural hematoma s/p craniotomy in 01/24, HTN, type 2 diabetes, epilepsy, iron deficiency anemia who presents with weakness and Hb of 6.4 noted on labs as an outpatient.    She suffered large size right frontal intraparenchymal hemorrhage in January 2024, leading to significant decline in functional status since then, now at nursing home, with left hemiparesis, dysarthria, dysphagia, nonambulatory. She had an MRI September 2024 with sequela of right hemisphere herrmorrahe s/p craniectomy.   For the past 6 months, baseline Hb 7.5-8.5. We called facility [facility]) for collateral. Patient's nurse confirmed that she has not had any vomiting, hematochezia, melena, or other obvious bleeding.  Does not take blood thinners.   She is bedbound, and her speech is dysarthric at her baseline. She is also on 4 L [place] for chronic respiratory failure, also unchanged from her baseline.   Past Medical History - epilepsy - traumatic subdural s/p craniotomy in 2024 (intubated for 2 weeks, has chronic indwelling foley) - iron deficiency anemia - T2DM w CKD - HLD Macular degeneration   Meds:  - vitamin c 500 mg daily into tube - lipitor 80 mg daily - coreg 6.25 mg BID - Zetia 10 mg daily - Pepcid 20  mg BID - Glucerna feeding supplement - Ferrous  sulfate 408 mg  - Prozac 20 mg - Free water 120 mL q4h - Gabapentin 2 mL (100 mg) q8h - Robitussin 200 mg QID - Humalog 0-15u four times daily - Duoneb q4h PRN - Metanx 1 tablet daily - Keppra 500 mg ()5 mL BID - Metformin 500 mg bid - zofran 4 mg q8h  - Protonix 40 mg daily - PreserVIsion 1 each BID    Past Surgical History  Past Surgical History:  Procedure Laterality Date   COLONOSCOPY     CRANIOTOMY Right 07/08/2022   Procedure: RIGHT CRANIOTOMY HEMATOMA EVACUATION SUBDURAL;  Surgeon: Julio Sicks, MD;  Location: [facility] OR;  Service: Neurosurgery;  Laterality: Right;   ESOPHAGOGASTRODUODENOSCOPY     ESOPHAGOGASTRODUODENOSCOPY (EGD) WITH PROPOFOL N/A 08/01/2022   Procedure: ESOPHAGOGASTRODUODENOSCOPY (EGD) WITH PROPOFOL;  Surgeon: Diamantina Monks, MD;  Location: [facility] ENDOSCOPY;  Service: General;  Laterality: N/A;   PEG PLACEMENT N/A 08/01/2022   Procedure: PERCUTANEOUS ENDOSCOPIC GASTROSTOMY (PEG) PLACEMENT;  Surgeon: Diamantina Monks, MD;  Location: [facility] ENDOSCOPY;  Service: General;  Laterality: N/A;    Social:  Lives : [place]  Occupation: retired Support: Family.  Level of Function: PCP: Patient, No Pcp Per Substances: Tobacco, no alcohol, no other substance.   Allergies: Allergies as of 04/27/2023 - Review Complete 04/27/2023  Allergen Reaction Noted   Cozaar [losartan] Swelling 08/05/2022   Periogard [chlorhexidine gluconate] Swelling and Other (  See Comments)    Pork allergy Other (See Comments) 02/13/2023   Vasotec [enalapril] Cough 09/02/2013    Review of Systems: A complete ROS was negative except as per HPI.   OBJECTIVE:   Physical Exam: Blood pressure 135/71, pulse 72, temperature 98.1 F (36.7 C), temperature source Oral, resp. rate 17, height 5\' 4"  (1.626 m), weight 54.4 kg, SpO2 94%.   Constitutional: ill-appearing, lying in bed, not in acute distress. HENT: Normocephalic, atraumatic. Chronic left facial droop. Eyes: Sclera non-icteric, PERRL, EOM  intact Cardio:Regular rate and rhythm. No murmurs, rubs, or gallops. 2+ bilateral dorsalis pedis pulses. Pulm:Clear to auscultation bilaterally. Normal work of breathing on room air. Abdomen: Soft, non-tender, PEG tube in place without erythema or bleeding. WJX:BJYNWGNF for extremity edema. Skin:Warm and dry. Neuro:Alert, severe dysarthria.  No focal weakness. Psych:Pleasant mood and affect.  Labs: CBC    Component Value Date/Time   WBC 5.7 04/27/2023 1742   RBC 2.29 (L) 04/27/2023 1742   HGB 6.4 (LL) 04/27/2023 1742   HGB 10.5 (L) 07/12/2021 1053   HCT 21.1 (L) 04/27/2023 1742   HCT 32.7 (L) 07/12/2021 1053   PLT 435 (H) 04/27/2023 1742   PLT 264 07/12/2021 1053   MCV 92.1 04/27/2023 1742   MCV 94 07/12/2021 1053   MCH 27.9 04/27/2023 1742   MCHC 30.3 04/27/2023 1742   RDW 17.3 (H) 04/27/2023 1742   RDW 13.5 07/12/2021 1053   LYMPHSABS 1.2 04/27/2023 1742   LYMPHSABS 0.7 07/12/2021 1053   MONOABS 0.8 04/27/2023 1742   EOSABS 0.2 04/27/2023 1742   EOSABS 0.0 07/12/2021 1053   BASOSABS 0.0 04/27/2023 1742   BASOSABS 0.0 07/12/2021 1053     CMP     Component Value Date/Time   NA 134 (L) 04/27/2023 1742   NA 141 07/12/2021 1053   K 4.1 04/27/2023 1742   CL 97 (L) 04/27/2023 1742   CO2 27 04/27/2023 1742   GLUCOSE 83 04/27/2023 1742   BUN 20 04/27/2023 1742   BUN 17 07/12/2021 1053   CREATININE 0.70 04/27/2023 1742   CREATININE 0.81 12/04/2011 1429   CALCIUM 8.9 04/27/2023 1742   PROT 7.2 04/27/2023 1742   PROT 7.0 07/12/2021 1053   ALBUMIN 2.0 (L) 04/27/2023 1742   ALBUMIN 3.8 07/12/2021 1053   AST 62 (H) 04/27/2023 1742   ALT 34 04/27/2023 1742   ALKPHOS 67 04/27/2023 1742   BILITOT 0.5 04/27/2023 1742   BILITOT 0.2 07/12/2021 1053   GFRNONAA >60 04/27/2023 1742   GFRNONAA 72 12/04/2011 1429   GFRAA 73 05/11/2020 1106   GFRAA 83 12/04/2011 1429    EKG: personally reviewed my interpretation is Normal sinus rhythm. Prolonged PR interval.   ASSESSMENT &  PLAN:   Assessment & Plan by Problem: Principal Problem:   Acute on chronic anemia Active Problems:   Hyperlipidemia   Essential hypertension   Gastroesophageal reflux disease without esophagitis   Gina Kelley is a 87 y.o. female with pertinent PMH of jaw hematoma status postcraniotomy, type 2 diabetes, hyperlipidemia, iron deficiency anemia who presented with weakness and Hb of 6.2 is admitted for acute on chronic anemia.  #Acute on chronic anemia # Concern for GI bleed On presentation, Hb 6.4.  She is asymptomatic, hemodynamically stable.  No hematemesis or black starry stools.  She has a history of normocytic anemia, Hb around 7.5-8.5.  Potential etiologies of acute blood loss include upper GI vs lower GI bleed vs. worsening of anemia of chronic disease.  At the  ED, digital rectal exam was normal. Patient has been evaluated by GI in the past, was not eligible for endoscopy due to her co morbidities.   She is status post 1 unit of packed PRBCs.  Will engage GI if patient hemoglobin still drops posttransfusion.  -Posttransfusion H&H -IV pantoprazole 40 mg.  -CBC, BMP -Iron, ferritin, TIBC. -Continue Ferrous sulfate 408 mg   History of ICH/SDH s/p craniotomy Dysphagia   Stable. She has residual left sided hemiparesis/dysphagia requiring PEG tube.    -Continue Glucerna( tube feeds)  Stable Medical Conditions   # Essential hypertension  -Amlodipine 10 mg. -Coreg 6.25 mg   # Diabetes mellitus type 2, with long-term use of insulin  -  metformin 500 mg  -  Humalog 0-15u four times daily    # Seizure disorder -Continue Keppra   # Hyperlipidemia  Lipitor 80 mg.  Zetia 10 mg.   # Depression -Continue Prozac 20 mg.    # GERD -Continue Protonix 40 mg.  Diet: Tube Feeds VTE: SCDs IVF: None,None Code: DNR/DNI  Dispo: Admit patient to Inpatient with expected length of stay greater than 2 midnights.  Signed:  Laretta Bolster, M.D.  Internal Medicine Resident,  PGY-1 [facility]  Pager: [phone] 5:23 AM, 04/28/2023   **Please contact the on call pager after 5 pm and on weekends at [phone].**

## 2023-04-27 NOTE — ED Notes (Signed)
1st unit PRBC infusing , verified by 2nd RN , IV site intact , VSS. Respirations unlabored .

## 2023-04-27 NOTE — ED Triage Notes (Signed)
Pt BIB [facility] from [facility]. Pt had labs drawn earlier this morning and her hemoglobin is  6.2. Pt is nonverbal and bed bound at baseline.Pt is at 4L [place] at baseline.  EMS VS: 124/70 BP 61 P 99% O2 16 R  106 CBG

## 2023-04-27 NOTE — ED Notes (Signed)
[facility] called for transport back to [facility]

## 2023-04-27 NOTE — Telephone Encounter (Signed)
LVM for [place] @ [facility] with my direct number.  Pt was approved, auth#: [id number] (04/28/23-06/29/24)

## 2023-04-27 NOTE — ED Provider Notes (Signed)
Gina Kelley EMERGENCY DEPARTMENT AT [facility] Provider Note   CSN: [id number] Arrival date & time: 04/27/23  1644     History  Chief Complaint  Patient presents with   Weakness   Abnormal Labs    Gina Kelley is a 87 y.o. female with medical history significant of HTN, DM type II, large spontaneous subdural hematoma and intracerebral hemorrhage in 06/2022 requiring craniotomy, status post chronic indwelling Foley catheter and pain, and chronic  respiratory failure on 5 L of nasal cannula oxygen, history of GERD PUD, bed bound presents after low hemoglobin today on routine facility labs.  Patient's baseline hemoglobin appears to be in the upper sevens per review of outside hospital lab documentation.  Today hemoglobin was 6.2. Called facility for collateral. Per them, patient has not had any vomiting, hematochezia, melena, or other obvious bleeding.  Does not take blood thinners.  Gina Kelley is her POA. Full code.   The history is provided by medical records, the EMS personnel and the nursing home. The history is limited by the condition of the patient and the absence of a caregiver. No language interpreter was used.       Home Medications Prior to Admission medications   Medication Sig Start Date End Date Taking? Authorizing Provider  acetaminophen (TYLENOL) 500 MG tablet Place 500-1,000 mg into feeding tube See admin instructions. 2 entries on MAR: Give 1000mg  (2 tablet) via G-tube twice daily and 500 mg (1 tablet) every 6 hours as needed for pain.   Yes [provider]  amLODipine (NORVASC) 10 MG tablet Place 1 tablet (10 mg total) into feeding tube daily. 08/23/22  Yes Setzer, Lynnell Jude, PA-C  ascorbic acid (VITAMIN C) 500 MG tablet Place 1 tablet (500 mg total) into feeding tube daily. 08/23/22  Yes Setzer, Lynnell Jude, PA-C  atorvastatin (LIPITOR) 80 MG tablet Place 1 tablet (80 mg total) into feeding tube daily. 08/23/22  Yes Setzer, Lynnell Jude, PA-C   carvedilol (COREG) 6.25 MG tablet Place 1 tablet (6.25 mg total) into feeding tube 2 (two) times daily with a meal. 08/22/22  Yes Setzer, Lynnell Jude, PA-C  ezetimibe (ZETIA) 10 MG tablet Place 1 tablet (10 mg total) into feeding tube daily. 08/23/22  Yes Setzer, Lynnell Jude, PA-C  famotidine (PEPCID) 20 MG tablet Place 1 tablet (20 mg total) into feeding tube 2 (two) times daily. 08/22/22  Yes Setzer, Lynnell Jude, PA-C  Ferrous Sulfate (ONE VITE FERROUS SULFATE) 300 MG/6.8ML SOLN Give 9.27 mLs by tube daily.   Yes [provider]  FLUoxetine (PROZAC) 20 MG capsule Place 1 capsule (20 mg total) into feeding tube at bedtime. 08/22/22  Yes Setzer, Lynnell Jude, PA-C  gabapentin (NEURONTIN) 250 MG/5ML solution Place 2 mLs (100 mg total) into feeding tube every 8 (eight) hours. 08/22/22  Yes Setzer, Lynnell Jude, PA-C  guaiFENesin (ROBITUSSIN) 100 MG/5ML liquid Place 100 mg into feeding tube 4 (four) times daily.   Yes [provider]  insulin lispro (HUMALOG) 100 UNIT/ML KwikPen Inject 0-16 Units into the skin See admin instructions. Inject 0-16 units 4 times daily per sliding scale: 70-150 : 0 units 151-200 : 3 units 201-250 : 6 units 251-300 : 9 units 301-350 : 12 units 351-400 : 16 units > 401 : notify MD   Yes [provider]  ipratropium-albuterol (DUONEB) 0.5-2.5 (3) MG/3ML SOLN Take 3 mLs by nebulization every 4 (four) hours as needed. 02/18/23  Yes Ghimire, Werner Lean, MD  l-methylfolate-[id number] Latrelle Dodrill) [id number]  MG TABS tablet Place 1 tablet into feeding tube daily. 08/23/22  Yes Setzer, Lynnell Jude, PA-C  levETIRAcetam (KEPPRA) 100 MG/ML solution Place 5 mLs (500 mg total) into feeding tube 2 (two) times daily. 01/22/23  Yes Micki Riley, MD  lidocaine 4 % Place 1 patch onto the skin See admin instructions. Apply 1 patch to neck and 1 patch to right knee topically in the morning and remove per schedule.   Yes [provider]  loperamide (IMODIUM A-D) 2 MG tablet Place 4 mg into  feeding tube See admin instructions. Give 4 mg (2 tablet) via tube every 24 hours as needed for watery stools. Hold tube feeding for 4 hours for watery stools and administer medication.   Yes [provider]  metFORMIN (GLUCOPHAGE) 500 MG tablet Place 1 tablet (500 mg total) into feeding tube 2 (two) times daily with a meal. 08/22/22  Yes Setzer, Lynnell Jude, PA-C  metoCLOPramide (REGLAN) 5 MG tablet Place 5 mg into feeding tube every 8 (eight) hours as needed for nausea or vomiting.   Yes [provider]  Multiple Vitamins-Minerals (PRESERVISION AREDS 2) CHEW Give 1 each by tube 2 (two) times daily.   Yes [provider]  Nutritional Supplements (FEEDING SUPPLEMENT, GLUCERNA 1.5 CAL,) LIQD Place 1,000 mLs into feeding tube daily. Patient taking differently: Place 40 mL/hr into feeding tube continuous. 02/18/23  Yes Ghimire, Werner Lean, MD  ondansetron (ZOFRAN) 4 MG tablet Take 4 mg by mouth every 8 (eight) hours as needed for nausea or vomiting.   Yes [provider]  OXYGEN Inhale 4 L/min into the lungs continuous.   Yes [provider]  Pantoprazole Sodium POWD Give 1 packet by tube See admin instructions. Pantoprazole DR suspension packets. Give 1 packet (40 mg) via G-tube once daily.   Yes [provider]  Water For Irrigation, Sterile (FREE WATER) SOLN Place 30-120 mLs into feeding tube See admin instructions. Place 120 mL into feeding tube every 4 hours/6 times a day. Flush tube with 30 mL water before and after giving medications through tube, three times a day.   Yes [provider]  amoxicillin-clavulanate (AUGMENTIN) 875-125 MG tablet Place 1 tablet into feeding tube 2 (two) times daily. Patient not taking: Reported on 04/27/2023    [provider]  doxycycline (ADOXA) 100 MG tablet Place 100 mg into feeding tube 2 (two) times daily. Patient not taking: Reported on 04/27/2023    [provider]      Allergies     Cozaar [losartan], Periogard [chlorhexidine gluconate], Pork allergy, and Vasotec [enalapril]    Review of Systems   Review of Systems unable to obtain due to patient condition  Physical Exam Updated Vital Signs BP 124/62   Pulse 65   Temp 97.8 F (36.6 C) (Oral)   Resp 16   Ht 5\' 4"  (1.626 m)   Wt 54.4 kg   SpO2 95%   BMI 20.60 kg/m  Physical Exam Constitutional:      General: She is not in acute distress.    Appearance: She is not ill-appearing.  HENT:     Head: Normocephalic and atraumatic.     Comments: Chronic facial droop    Nose: Nose normal.     Mouth/Throat:     Mouth: Mucous membranes are moist.  Eyes:     Extraocular Movements: Extraocular movements intact.     Pupils: Pupils are equal, round, and reactive to light.  Cardiovascular:     Rate  and Rhythm: Normal rate and regular rhythm.     Pulses: Normal pulses.     Heart sounds: Normal heart sounds.  Pulmonary:     Effort: Pulmonary effort is normal.     Breath sounds: Normal breath sounds.  Abdominal:     General: There is no distension.     Palpations: Abdomen is soft.     Tenderness: There is no abdominal tenderness. There is no guarding.  Genitourinary:    Rectum: Guaiac result negative.     Comments: Normal rectum and anus without fissure, hemorrhoids, or evidence of melena Musculoskeletal:     Cervical back: Neck supple.     Right lower leg: No edema.     Left lower leg: No edema.  Skin:    General: Skin is warm and dry.     Capillary Refill: Capillary refill takes less than 2 seconds.  Neurological:     Mental Status: She is alert. Mental status is at baseline.     Comments: Occaisonally answers single word questions, at baseline     ED Results / Procedures / Treatments   Labs (all labs ordered are listed, but only abnormal results are displayed) Labs Reviewed  CBC WITH DIFFERENTIAL/PLATELET - Abnormal; Notable for the following components:      Result Value   RBC 2.29 (*)     Hemoglobin 6.4 (*)    HCT 21.1 (*)    RDW 17.3 (*)    Platelets 435 (*)    All other components within normal limits  COMPREHENSIVE METABOLIC PANEL - Abnormal; Notable for the following components:   Sodium 134 (*)    Chloride 97 (*)    Albumin 2.0 (*)    AST 62 (*)    All other components within normal limits  PROTIME-INR  APTT  OCCULT BLOOD X 1 CARD TO LAB, STOOL  POC OCCULT BLOOD, ED  TYPE AND SCREEN  PREPARE RBC (CROSSMATCH)    EKG EKG Interpretation Date/Time:  Monday April 27 2023 17:06:50 EDT Ventricular Rate:  66 PR Interval:  215 QRS Duration:  70 QT Interval:  435 QTC Calculation: 456 R Axis:   48  Text Interpretation: Sinus rhythm Borderline prolonged PR interval No significant change since last tracing Confirmed by Melene Plan [id number]) on 04/27/2023 5:30:15 PM  Radiology No results found.  Procedures Procedures    Medications Ordered in ED Medications  0.9 %  sodium chloride infusion (Manually program via Guardrails IV Fluids) (has no administration in time range)    ED Course/ Medical Decision Making/ A&P                               Medical Decision Making Amount and/or Complexity of Data Reviewed Independent Historian: caregiver and EMS    Details: Called facility for collateral Labs: ordered. Decision-making details documented in ED Course. Radiology: ordered and independent interpretation performed. Decision-making details documented in ED Course. ECG/medicine tests: ordered and independent interpretation performed. Decision-making details documented in ED Course.  Risk Prescription drug management. Decision regarding hospitalization.   87 year old female presenting after hemoglobin 6.2 down from 7.7 on labs.  History and exam are limited by her condition secondary to several medical comorbidities as above.  Differential considered includes acute life-threatening hemorrhage, GI bleeding upper or lower, external bleeding, worsening anemia of  chronic disease. Hemodynamically stable on arrival without evidence of shock.  Fecal occult blood testing negative.  No evidence of melena on  her exam.  No source of rectal or anal bleeding identified.  Remainder physical exam is nonfocal.  No evidence of external bleeding.  Labs, EKG obtained.  EKG reviewed by myself independently and notable for NSR rate of 66 normal axis and intervals, no ST segment elevations or depressions, no pathologic TWI, overall reassuring.   Labs notable for hemoglobin of 6.4, no leukocytosis, stable electrolytes renal and liver function.   The patient's power of attorney Gina her daughter was consented at bedside for blood transfusion.  Patient was given 1 unit of PRBCs in the ED.  Given her complex medical comorbidities and inability to participate in history or physical exam due to these issues, she requires admission for observation and serial CBC; if hemoglobin continues to downtrend may require further consultation or endoscopy.  Admitted to internal medicine resident team floor in stable condition without further event.            Final Clinical Impression(s) / ED Diagnoses Final diagnoses:  Anemia, unspecified type    Rx / DC Orders ED Discharge Orders     None         Karmen Stabs, MD 04/27/23 2102    Melene Plan, DO 04/27/23 2110

## 2023-04-27 NOTE — Plan of Care (Signed)

## 2023-04-27 NOTE — ED Notes (Addendum)
Patient's family member signed consent form for blood transfusion , EDP explained plan of care and admission plan of patient to family at bedside.

## 2023-04-27 NOTE — ED Notes (Signed)
ED TO INPATIENT HANDOFF REPORT  ED Nurse Name and Phone #:  Lucious Groves [phone]  S Name/Age/Gender Gina Kelley 87 y.o. female Room/Bed: 016C/016C  Code Status   Code Status: Full Code  Home/[name]/Other Home Patient oriented to: self, place, time, and situation Is this baseline? Yes   Triage Complete: Triage complete  Chief Complaint Acute on chronic anemia [D64.9]  Triage Note Pt BIB [facility] from [facility]. Pt had labs drawn earlier this morning and her hemoglobin is  6.2. Pt is nonverbal and bed bound at baseline.Pt is at 4L [place] at baseline.  EMS VS: 124/70 BP 61 P 99% O2 16 R  106 CBG   Allergies Allergies  Allergen Reactions   Cozaar [Losartan] Swelling    Angioedema    Periogard [Chlorhexidine Gluconate] Swelling and Other (See Comments)    Skin irritation Eye/tongue/lip swelling   Pork Allergy Other (See Comments)    Unknown reaction   Vasotec [Enalapril] Cough    Level of Care/Admitting Diagnosis ED Disposition     ED Disposition  Admit   Condition  --   Comment  Hospital Area: [facility] [[id number]]  Level of Care: Telemetry Medical [104]  May place patient in observation at [facility] or [facility] if equivalent level of care is available:: No  Covid Evaluation: Asymptomatic - no recent exposure (last 10 days) testing not required  Diagnosis: Acute on chronic anemia [[id number]]  Admitting Physician: Ginnie Smart [[id number]]  Attending Physician: Ninetta Lights, JEFFREY C [[id number]]          B Medical/Surgery History Past Medical History:  Diagnosis Date   Anemia of chronic disease 07/08/2006   Antral ulcer 02/23/2007   Seen on EGD in 2008, small ulcer with erosion    Cerebral vascular accident (HCC) 04/14/2006   1998, left lower extremity numbness, no residual deficits    Essential hypertension 04/14/2006   Gastroesophageal reflux disease 02/18/2013   Occasional, symptomatically relieved with peptobismol    Healthcare  maintenance 12/04/2011   Hyperlipidemia LDL goal < 100 04/14/2006   Hypertensive retinopathy of both eyes, grade 1 05/15/2016   Osteopenia of right femoral neck 10/20/2016   DEXA (10/16/2016): R femur T -2.5 (FRAX tool calculates at -2.4), L1-L4 spine T -0.9, 10 year risk for: Major osteoporotic fracture 8.3%, Hip fracture 2.7%   Seizure disorder (HCC) 03/01/2023   Type 2 diabetes mellitus with stage 1 chronic kidney disease, without long-term current use of insulin (HCC)    Type II diabetes mellitus (HCC) 04/14/2006   Past Surgical History:  Procedure Laterality Date   COLONOSCOPY     CRANIOTOMY Right 07/08/2022   Procedure: RIGHT CRANIOTOMY HEMATOMA EVACUATION SUBDURAL;  Surgeon: Julio Sicks, MD;  Location: [facility] OR;  Service: Neurosurgery;  Laterality: Right;   ESOPHAGOGASTRODUODENOSCOPY     ESOPHAGOGASTRODUODENOSCOPY (EGD) WITH PROPOFOL N/A 08/01/2022   Procedure: ESOPHAGOGASTRODUODENOSCOPY (EGD) WITH PROPOFOL;  Surgeon: Diamantina Monks, MD;  Location: [facility] ENDOSCOPY;  Service: General;  Laterality: N/A;   PEG PLACEMENT N/A 08/01/2022   Procedure: PERCUTANEOUS ENDOSCOPIC GASTROSTOMY (PEG) PLACEMENT;  Surgeon: Diamantina Monks, MD;  Location: [facility] ENDOSCOPY;  Service: General;  Laterality: N/A;     A IV Location/Drains/Wounds Patient Lines/Drains/Airways Status     Active Line/Drains/Airways     Name Placement date Placement time Site Days   Peripheral IV 04/27/23 20 G Anterior;Right Forearm 04/27/23  1802  Forearm  less than 1   Gastrostomy/Enterostomy PEG-jejunostomy 24 Fr. LLQ 08/01/22  1044  LLQ  269            Intake/Output Last 24 hours  Intake/Output Summary (Last 24 hours) at 04/27/2023 2035 Last data filed at 04/27/2023 2034 Gross per 24 hour  Intake 640 ml  Output --  Net 640 ml    Labs/Imaging Results for orders placed or performed during the hospital encounter of 04/27/23 (from the past 48 hour(s))  CBC with Differential     Status: Abnormal   Collection Time:  04/27/23  5:42 PM  Result Value Ref Range   WBC 5.7 4.0 - 10.5 K/uL   RBC 2.29 (L) 3.87 - 5.11 MIL/uL   Hemoglobin 6.4 (LL) 12.0 - 15.0 g/dL    Comment: REPEATED TO VERIFY THIS CRITICAL RESULT HAS VERIFIED AND BEEN CALLED TO C. WALKER, RN BY DAISY BLU ON 10 28 2024 AT 1851, AND HAS BEEN READ BACK.     HCT 21.1 (L) 36.0 - 46.0 %   MCV 92.1 80.0 - 100.0 fL   MCH 27.9 26.0 - 34.0 pg   MCHC 30.3 30.0 - 36.0 g/dL   RDW 16.1 (H) 09.6 - 04.5 %   Platelets 435 (H) 150 - 400 K/uL   nRBC 0.0 0.0 - 0.2 %   Neutrophils Relative % 63 %   Neutro Abs 3.6 1.7 - 7.7 K/uL   Lymphocytes Relative 20 %   Lymphs Abs 1.2 0.7 - 4.0 K/uL   Monocytes Relative 14 %   Monocytes Absolute 0.8 0.1 - 1.0 K/uL   Eosinophils Relative 3 %   Eosinophils Absolute 0.2 0.0 - 0.5 K/uL   Basophils Relative 0 %   Basophils Absolute 0.0 0.0 - 0.1 K/uL   Immature Granulocytes 0 %   Abs Immature Granulocytes 0.01 0.00 - 0.07 K/uL    Comment: Performed at [facility], [address], [place], [place] [address]  Comprehensive metabolic panel     Status: Abnormal   Collection Time: 04/27/23  5:42 PM  Result Value Ref Range   Sodium 134 (L) 135 - 145 mmol/L   Potassium 4.1 3.5 - 5.1 mmol/L   Chloride 97 (L) 98 - 111 mmol/L   CO2 27 22 - 32 mmol/L   Glucose, Bld 83 70 - 99 mg/dL    Comment: Glucose reference range applies only to samples taken after fasting for at least 8 hours.   BUN 20 8 - 23 mg/dL   Creatinine, Ser 1.91 0.44 - 1.00 mg/dL   Calcium 8.9 8.9 - 47.8 mg/dL   Total Protein 7.2 6.5 - 8.1 g/dL   Albumin 2.0 (L) 3.5 - 5.0 g/dL   AST 62 (H) 15 - 41 U/L   ALT 34 0 - 44 U/L   Alkaline Phosphatase 67 38 - 126 U/L   Total Bilirubin 0.5 0.3 - 1.2 mg/dL   GFR, Estimated >29 >56 mL/min    Comment: (NOTE) Calculated using the CKD-EPI Creatinine Equation (2021)    Anion gap 10 5 - 15    Comment: Performed at [facility], [address] [place], [place] [address]  Protime-INR     Status: None    Collection Time: 04/27/23  5:42 PM  Result Value Ref Range   Prothrombin Time 14.8 11.4 - 15.2 seconds   INR 1.1 0.8 - 1.2    Comment: (NOTE) INR goal varies based on device and disease states. Performed at [facility], [address] [place], [place] [address]   APTT  Status: None   Collection Time: 04/27/23  5:42 PM  Result Value Ref Range   aPTT 24 24 - 36 seconds    Comment: Performed at [facility], [address], [place], [place] [address]  POC occult blood, ED     Status: None   Collection Time: 04/27/23  5:52 PM  Result Value Ref Range   Fecal Occult Bld NEGATIVE NEGATIVE  Type and screen [facility]     Status: None (Preliminary result)   Collection Time: 04/27/23  6:31 PM  Result Value Ref Range   ABO/RH(D) O POS    Antibody Screen NEG    Sample Expiration 04/30/2023,2359    Unit Number [id number]    Blood Component Type RED CELLS,LR    Unit division 00    Status of Unit ISSUED    Transfusion Status OK TO TRANSFUSE    Crossmatch Result      Compatible Performed at [facility], [address], [place], [place] [address]   Prepare RBC (crossmatch)     Status: None   Collection Time: 04/27/23  7:09 PM  Result Value Ref Range   Order Confirmation      ORDER PROCESSED BY BLOOD BANK Performed at [facility], [address], [place], [place] [address]    No results found.  Pending Labs Unresulted Labs (From admission, onward)     Start     Ordered   04/28/23 0500  Basic metabolic panel  Tomorrow morning,   R        04/27/23 2033   04/28/23 0500  CBC  Tomorrow morning,   R        04/27/23 2033   04/27/23 1738  Occult blood card to lab, stool Provider will collect  Once,   URGENT       Question:  Specimen to be collected by:  Answer:  Provider will collect   04/27/23 1737            Vitals/Pain Today's Vitals   04/27/23 1948 04/27/23 2000 04/27/23 2032 04/27/23 2035  BP: 134/65 135/71 (!) 152/72 (!)  152/72  Pulse: 74 72 71 72  Resp: 16 17 19 18   Temp: 98.1 F (36.7 C)  98 F (36.7 C)   TempSrc: Oral  Oral   SpO2:  94%  96%  Weight:      Height:      PainSc:        Isolation Precautions No active isolations  Medications Medications  acetaminophen (TYLENOL) tablet 650 mg (has no administration in time range)    Or  acetaminophen (TYLENOL) suppository 650 mg (has no administration in time range)  0.9 %  sodium chloride infusion (Manually program via Guardrails IV Fluids) ( Intravenous New Bag/Given 04/27/23 1936)    Mobility walks     Focused Assessments     R Recommendations: See Admitting Provider Note  Report given to:   Additional Notes:

## 2023-04-27 NOTE — ED Notes (Signed)
RN sent down repeat pink top with blood band sheet filled out per blood banks request

## 2023-04-27 NOTE — ED Notes (Signed)
Patient received 1 unit PRBC with no adverse effect, VSS /afebrile , respirations unlabored .

## 2023-04-27 NOTE — Hospital Course (Addendum)
HPI: 87 yo, bedbound Prior subdural w/ craniotomy, nursing home resident Baseline Hb 7.7 and today at facility it was 6.7  No dark stools, vomiting, or bloody output from G tube Not on thinners Daughter, [name], [place]  If bleeding again call GI  Answers simple questions intermittently, speech hard to understand   HB 6.2 at facility  Feeling like her usual self per RN, no pain, no bleeding  Meds: - Guafenisin 100 mg (5 mL four times daily) - coreg 6.25 mg bid - pepcid 20 mg BID - metformin 500 mg bid - lidocaine patch on back of neck every morning - multivitamin chewable (crushed) - gabapentin 250 mg (2 mL TID) - Protonix 40 mg daily - lipitor 80 mg - lidocaine patch on R knee every morning - zetia 10 mg daily - iron 9.27 mL once a day - keppra 5 mL so 500 mg BID - amlodipine 10 mg daily - Humalog 0-15u four times daily - ascorbic acid 500 mg daily  - prozac at bedtime    Social - family support - lives at [facility] since May - no tobacco, no alcohol, no other substance  Boykin Reaper is POA   [phone] [place] till 11pm

## 2023-04-28 ENCOUNTER — Encounter (HOSPITAL_COMMUNITY): Payer: Self-pay | Admitting: Infectious Diseases

## 2023-04-28 DIAGNOSIS — I158 Other secondary hypertension: Secondary | ICD-10-CM

## 2023-04-28 DIAGNOSIS — R195 Other fecal abnormalities: Secondary | ICD-10-CM | POA: Diagnosis not present

## 2023-04-28 DIAGNOSIS — D509 Iron deficiency anemia, unspecified: Secondary | ICD-10-CM

## 2023-04-28 DIAGNOSIS — D649 Anemia, unspecified: Secondary | ICD-10-CM | POA: Diagnosis not present

## 2023-04-28 DIAGNOSIS — E1149 Type 2 diabetes mellitus with other diabetic neurological complication: Secondary | ICD-10-CM

## 2023-04-28 DIAGNOSIS — D62 Acute posthemorrhagic anemia: Secondary | ICD-10-CM | POA: Diagnosis not present

## 2023-04-28 DIAGNOSIS — K922 Gastrointestinal hemorrhage, unspecified: Secondary | ICD-10-CM | POA: Diagnosis not present

## 2023-04-28 DIAGNOSIS — E43 Unspecified severe protein-calorie malnutrition: Secondary | ICD-10-CM | POA: Insufficient documentation

## 2023-04-28 LAB — TYPE AND SCREEN
ABO/RH(D): O POS
Antibody Screen: NEGATIVE
Unit division: 0

## 2023-04-28 LAB — CBC
HCT: 27 % — ABNORMAL LOW (ref 36.0–46.0)
Hemoglobin: 8.4 g/dL — ABNORMAL LOW (ref 12.0–15.0)
MCH: 27.1 pg (ref 26.0–34.0)
MCHC: 31.1 g/dL (ref 30.0–36.0)
MCV: 87.1 fL (ref 80.0–100.0)
Platelets: 453 10*3/uL — ABNORMAL HIGH (ref 150–400)
RBC: 3.1 MIL/uL — ABNORMAL LOW (ref 3.87–5.11)
RDW: 18.1 % — ABNORMAL HIGH (ref 11.5–15.5)
WBC: 7.6 10*3/uL (ref 4.0–10.5)
nRBC: 0 % (ref 0.0–0.2)

## 2023-04-28 LAB — GLUCOSE, CAPILLARY
Glucose-Capillary: 98 mg/dL (ref 70–99)
Glucose-Capillary: 130 mg/dL — ABNORMAL HIGH (ref 70–99)
Glucose-Capillary: 105 mg/dL — ABNORMAL HIGH (ref 70–99)
Glucose-Capillary: 127 mg/dL — ABNORMAL HIGH (ref 70–99)
Glucose-Capillary: 95 mg/dL (ref 70–99)

## 2023-04-28 LAB — BASIC METABOLIC PANEL
Anion gap: 10 (ref 5–15)
BUN: 19 mg/dL (ref 8–23)
CO2: 27 mmol/L (ref 22–32)
Calcium: 9 mg/dL (ref 8.9–10.3)
Chloride: 98 mmol/L (ref 98–111)
Creatinine, Ser: 0.61 mg/dL (ref 0.44–1.00)
GFR, Estimated: >60 mL/min (ref >60)
Glucose, Bld: 115 mg/dL — ABNORMAL HIGH (ref 70–99)
Potassium: 3.8 mmol/L (ref 3.5–5.1)
Sodium: 135 mmol/L (ref 135–145)

## 2023-04-28 LAB — IRON AND TIBC
Iron: 109 ug/dL (ref 28–170)
Saturation Ratios: 32 % — ABNORMAL HIGH (ref 10.4–31.8)
TIBC: 343 ug/dL (ref 250–450)
UIBC: 234 ug/dL

## 2023-04-28 LAB — BPAM RBC
Blood Product Expiration Date: 202411202359
ISSUE DATE / TIME: 202410281924
Unit Type and Rh: 5100

## 2023-04-28 LAB — HEMOGLOBIN AND HEMATOCRIT, BLOOD
HCT: 27 % — ABNORMAL LOW (ref 36.0–46.0)
Hemoglobin: 8.7 g/dL — ABNORMAL LOW (ref 12.0–15.0)

## 2023-04-28 LAB — FERRITIN: Ferritin: 29 ng/mL (ref 11–307)

## 2023-04-28 MED ORDER — PROSOURCE TF20 ENFIT COMPATIBL EN LIQD
60.0000 mL | Freq: Every day | ENTERAL | Status: DC
  Administered 2023-04-28 – 2023-04-30 (×3): 60 mL
  Filled 2023-04-28 (×3): qty 60

## 2023-04-28 MED ORDER — IRON SUCROSE 20 MG/ML IV SOLN
500.0000 mg | Freq: Once | INTRAVENOUS | Status: AC
  Administered 2023-04-28: 500 mg via INTRAVENOUS
  Filled 2023-04-28: qty 25

## 2023-04-28 MED ORDER — INSULIN ASPART 100 UNIT/ML IJ SOLN
0.0000 [IU] | INTRAMUSCULAR | Status: DC
  Administered 2023-04-28 – 2023-04-30 (×3): 1 [IU] via SUBCUTANEOUS

## 2023-04-28 MED ORDER — FERROUS SULFATE 300 (60 FE) MG/5ML PO SOLN
300.0000 mg | Freq: Every day | ORAL | Status: DC
  Administered 2023-04-28 – 2023-04-30 (×3): 300 mg
  Filled 2023-04-28 (×3): qty 5

## 2023-04-28 NOTE — Progress Notes (Signed)
Occupational Therapy Evaluation Patient Details Name: Gina Kelley MRN: [id number] DOB: 02-27-1936 Today's Date: 04/28/2023   History of Present Illness 87 yo female admitted 10/28 from [facility] with weakness. PMhx: SDH s/p craniotomy 2/24 with left hemiparesis, dysarthria. PEG, anemia, chronic foley, HTN, GERD, T2DM, HLD   Clinical Impression   Per chart review, pt dependent at baseline for mobility and ADLs. Pt [name] resident and has been using hoyer lift, has assist from staff for ADLs. Pt currently needing mod-total A for ADL and max +2 for bed mobility to sit EOB to change pad. Pt with LUE hemi from prior CVA, no AROM noted. Pt presenting with impairments listed below, will follow acutely. Patient will benefit from continued inpatient follow up therapy, <3 hours/day to maximize safety/ind with ADLs/functional mobility.       If plan is discharge home, recommend the following: Two people to help with walking and/or transfers;Two people to help with bathing/dressing/bathroom;Assistance with cooking/housework;Direct supervision/assist for medications management;Direct supervision/assist for financial management;Assist for transportation;Help with stairs or ramp for entrance    Functional Status Assessment  Patient has had a recent decline in their functional status and demonstrates the ability to make significant improvements in function in a reasonable and predictable amount of time.  Equipment Recommendations  Other (comment) (defer)    Recommendations for Other Services PT consult     Precautions / Restrictions Precautions Precautions: Fall;Other (comment) Precaution Comments: peg, L hemi Restrictions Weight Bearing Restrictions: No      Mobility Bed Mobility Overal bed mobility: Needs Assistance Bed Mobility: Supine to Sit, Sit to Supine     Supine to sit: Max assist, +2 for physical assistance Sit to supine: Max assist, +2 for physical assistance   General  bed mobility comments: max +2 to sit EOB briefly for pad change, pt with poor sitting balance    Transfers                   General transfer comment: deferred      Balance Overall balance assessment: Needs assistance   Sitting balance-Leahy Scale: Poor Sitting balance - Comments: support needed 2/2 post lean Postural control: Posterior lean                                 ADL either performed or assessed with clinical judgement   ADL Overall ADL's : Needs assistance/impaired Eating/Feeding: Total assistance;NPO Eating/Feeding Details (indicate cue type and reason): tube feeds via PEG Grooming: Wash/dry face;Contact guard assist;Bed level Grooming Details (indicate cue type and reason): washing face supine in bed Upper Body Bathing: Maximal assistance   Lower Body Bathing: Maximal assistance   Upper Body Dressing : Moderate assistance Upper Body Dressing Details (indicate cue type and reason): donning clean gown in supine Lower Body Dressing: Total assistance   Toilet Transfer: Total assistance;+2 for physical assistance   Toileting- Clothing Manipulation and Hygiene: Maximal assistance       Functional mobility during ADLs: Maximal assistance;+2 for physical assistance       Vision   Additional Comments: will further assess     Perception Perception: Not tested       Praxis Praxis: Not tested       Pertinent Vitals/Pain Pain Assessment Pain Assessment: No/denies pain     Extremity/Trunk Assessment Upper Extremity Assessment Upper Extremity Assessment: LUE deficits/detail LUE Deficits / Details: hemiparesis at baseline, no active movement noted PROM WFL, with  mild tone in digits/hand LUE Coordination: decreased gross motor;decreased fine motor   Lower Extremity Assessment Lower Extremity Assessment: Generalized weakness   Cervical / Trunk Assessment Cervical / Trunk Assessment: Kyphotic   Communication  Communication Communication: Difficulty communicating thoughts/reduced clarity of speech   Cognition Arousal: Alert Behavior During Therapy: Flat affect Overall Cognitive Status: Difficult to assess                                 General Comments: pt states she is at [facility] hospital, responds to name and follows commands intermittently     General Comments  VSS    Exercises     Shoulder Instructions      Home Living Family/patient expects to be discharged to:: Skilled nursing facility                                 Additional Comments: from [name]      Prior Functioning/Environment Prior Level of Function : Needs assist       Physical Assist : Mobility (physical);ADLs (physical) Mobility (physical): Bed mobility;Transfers ADLs (physical): Grooming;Bathing;Dressing;Toileting Mobility Comments: Since SDH has been max assist at [name], lift to chair, hasn't been sitting up EOB per daughter since Sept ADLs Comments: Pt was total care adls, PEG        OT Problem List: Decreased strength;Decreased activity tolerance;Decreased range of motion;Impaired balance (sitting and/or standing);Decreased coordination;Decreased cognition;Decreased safety awareness;Impaired UE functional use      OT Treatment/Interventions: Self-care/ADL training;Therapeutic exercise;Energy conservation;DME and/or AE instruction;Cognitive remediation/compensation;Therapeutic activities;Visual/perceptual remediation/compensation;Splinting;Neuromuscular education;Patient/family education;Balance training    OT Goals(Current goals can be found in the care plan section) Acute Rehab OT Goals Patient Stated Goal: none stated OT Goal Formulation: With patient Time For Goal Achievement: 05/12/23 Potential to Achieve Goals: Good ADL Goals Pt Will Perform Grooming: with contact guard assist;bed level Additional ADL Goal #1: pt will be able to roll R/L at bed level with  minA in order to assist with bed level ADLs Additional ADL Goal #2: pt will be able to demo fair sitting balance at EOB in prep for seated ADLs  OT Frequency: Min 1X/week    Co-evaluation              AM-PAC OT "6 Clicks" Daily Activity     Outcome Measure Help from another person eating meals?: Total Help from another person taking care of personal grooming?: A Lot Help from another person toileting, which includes using toliet, bedpan, or urinal?: A Lot Help from another person bathing (including washing, rinsing, drying)?: A Lot Help from another person to put on and taking off regular upper body clothing?: A Lot Help from another person to put on and taking off regular lower body clothing?: A Lot 6 Click Score: 11   End of Session Nurse Communication: Other (comment) (notified Consulting civil engineer (pt's RN on lunch) that tube feeds were disconnected/leaking upon arrival, Charge RN in room at end of session to address)  Activity Tolerance: Patient tolerated treatment well Patient left: in bed;with call bell/phone within reach;with bed alarm set  OT Visit Diagnosis: Unsteadiness on feet (R26.81);Other abnormalities of gait and mobility (R26.89);Muscle weakness (generalized) (M62.81)                Time: 1610-9604 OT Time Calculation (min): 22 min Charges:  OT General Charges $OT Visit: 1 Visit OT Evaluation $  OT Eval Moderate Complexity: 1 Mod  Aamirah Salmi K, OTD, OTR/L SecureChat Preferred Acute Rehab [phone]   Carver Fila Koonce 04/28/2023, 4:30 PM

## 2023-04-28 NOTE — Evaluation (Signed)
Physical Therapy Evaluation Patient Details Name: Gina Kelley MRN: [id number] DOB: 01-09-1936 Today's Date: 04/28/2023  History of Present Illness  87 yo female admitted 10/28 from [facility] with weakness. PMhx: SDH s/p craniotomy 2/24 with left hemiparesis, dysarthria. PEG, anemia, chronic foley, HTN, GERD, T2DM, HLD  Clinical Impression  Pt pleasant, able to state her name and family in room. Per family pt was able to sit up with assist in September and hasn't been working toward increased mobility since that time as therapy stopped at [name]. Daughter would like pt to be stronger and able to sit up to prevent aspiration risk. Discussed difference in long term [name] vs ST-[name] for rehab and will have to defer to facility to pick up further therapy based on the notes and progression they have seen. Will trial therapy acutely to work toward improved sitting balance and tolerance with recommendation for OOB via lift.         If plan is discharge home, recommend the following: Two people to help with walking and/or transfers;Two people to help with bathing/dressing/bathroom   Can travel by private vehicle   No    Equipment Recommendations None recommended by PT  Recommendations for Other Services       Functional Status Assessment Patient has had a recent decline in their functional status and/or demonstrates limited ability to make significant improvements in function in a reasonable and predictable amount of time     Precautions / Restrictions Precautions Precautions: Fall;Other (comment) Precaution Comments: peg      Mobility  Bed Mobility Overal bed mobility: Needs Assistance Bed Mobility: Supine to Sit, Sit to Supine     Supine to sit: Max assist Sit to supine: Max assist   General bed mobility comments: max assist to pivot to EOB and back with use of pad. Total +2 to slide up in bed    Transfers                        Ambulation/Gait                   Stairs            Wheelchair Mobility     Tilt Bed    Modified Rankin (Stroke Patients Only)       Balance Overall balance assessment: Needs assistance   Sitting balance-Leahy Scale: Poor Sitting balance - Comments: mod assist for sitting balance, preference for posterior right bias. Pt able to achieve CGA x 5 sec Postural control: Posterior lean, Right lateral lean                                   Pertinent Vitals/Pain Pain Assessment Pain Assessment: No/denies pain    Home Living Family/patient expects to be discharged to:: Skilled nursing facility                   Additional Comments: from [name]    Prior Function Prior Level of Function : Needs assist       Physical Assist : Mobility (physical);ADLs (physical) Mobility (physical): Bed mobility;Transfers ADLs (physical): Grooming;Bathing;Dressing;Toileting Mobility Comments: Since SDH has been max assist at [name], lift to chair, hasn't been sitting up EOB per daughter since Sept ADLs Comments: Pt was total care adls, PEG     Extremity/Trunk Assessment   Upper Extremity Assessment Upper Extremity Assessment: LUE  deficits/detail LUE Deficits / Details: hemiparesis at baseline    Lower Extremity Assessment Lower Extremity Assessment: Generalized weakness    Cervical / Trunk Assessment Cervical / Trunk Assessment: Kyphotic  Communication   Communication Communication: Difficulty communicating thoughts/reduced clarity of speech  Cognition Arousal: Alert Behavior During Therapy: Flat affect Overall Cognitive Status: Difficult to assess                                 General Comments: pt aware of self and family in room, stating she wants to go to the beach.        General Comments      Exercises     Assessment/Plan    PT Assessment Patient needs continued PT services  PT Problem List Decreased strength;Decreased activity  tolerance;Decreased balance;Decreased mobility       PT Treatment Interventions Functional mobility training;Therapeutic activities;Therapeutic exercise;Balance training;Patient/family education    PT Goals (Current goals can be found in the Care Plan section)  Acute Rehab PT Goals Patient Stated Goal: see my mom be able to sit up PT Goal Formulation: With family Time For Goal Achievement: 05/12/23 Potential to Achieve Goals: Fair    Frequency Min 1X/week     Co-evaluation               AM-PAC PT "6 Clicks" Mobility  Outcome Measure Help needed turning from your back to your side while in a flat bed without using bedrails?: Total Help needed moving from lying on your back to sitting on the side of a flat bed without using bedrails?: Total Help needed moving to and from a bed to a chair (including a wheelchair)?: Total Help needed standing up from a chair using your arms (e.g., wheelchair or bedside chair)?: Total Help needed to walk in hospital room?: Total Help needed climbing 3-5 steps with a railing? : Total 6 Click Score: 6    End of Session   Activity Tolerance: Patient tolerated treatment well Patient left: in bed;with call bell/phone within reach;with bed alarm set;with family/visitor present Nurse Communication: Mobility status;Need for lift equipment PT Visit Diagnosis: Other abnormalities of gait and mobility (R26.89);Muscle weakness (generalized) (M62.81)    Time: 1610-9604 PT Time Calculation (min) (ACUTE ONLY): 20 min   Charges:   PT Evaluation $PT Eval Moderate Complexity: 1 Mod   PT General Charges $$ ACUTE PT VISIT: 1 Visit         Merryl Hacker, PT Acute Rehabilitation Services Office: [phone]   Oddis Westling B Ziara Thelander 04/28/2023, 1:35 PM

## 2023-04-28 NOTE — Consult Note (Signed)
Consultation  Referring Provider: Medicine service/[facility] Primary Care Physician:  Patient, No Pcp Per Primary Gastroenterologist:  unassigned/[facility] saw as inpatient 03/2023  Reason for Consultation: Anemia and heme positive stool  HPI: Gina Kelley is a 87 y.o. female, nursing home resident, at [facility] who had routine labs done at her facility earlier this week with finding of hemoglobin 6.2 and was brought to the emergency room. Her baseline hemoglobin over the past several months has been in the 7.5-8.5 range.  Per the admitting notes the nursing home had related that she had not noted any vomiting, no hematochezia or melena reported She is not on any blood thinners. She is unfortunately bedbound and has a chronic dysarthria after a large subdural hematoma which required craniotomy in January 2024.  She has a gastrostomy tube in place, and does not take food orally. She has history of hypertension, adult onset diabetes mellitus, seizure disorder, and history of iron deficiency anemia. She also has history of chronic respiratory failure and per the admitting notes and previous notes she had been on 4 L nasal cannula chronically . He was seen by GI in September 2024 for similar issues with anemia and heme positive stool.  At that time she was felt to get appropriate for any endoscopic intervention, and at that time also did not have any evidence of any active bleeding.  Per Dr. Frankey Shown notes and discussion with family at that time agreement was that endoscopy would only be for emergent last resort type measures if she would have a large bleed.  Per those notes even then they would not likely want to proceed. She is on Pepcid outpatient once daily and pantoprazole powder 40 mg daily for prophylaxis.  There was no evidence of melena or hematochezia on rectal exam on admission, stool was sent for Hemoccult and returned positive.  Patient is unable to offer any  history.  Transfused 1 unit of packed RBCs last night and was receiving an iron infusion today Admitting hemoglobin 6.4/hematocrit 21.1/MCV 92 BUN 20/creatinine 0.7 INR 1.1 Ferritin 29/serum iron 109/TIBC 343/iron sat 32  HGB today 8.4 after 1 unit.  Patient had CT of the chest abdomen and pelvis done in September 2024 without any evidence of GI malignancy.  Remote Colonoscopy and EGD per Dr. Leone Payor 2008 with small antral erosion and small hiatal hernia Colonoscopy at that time completely negative other than internal hemorrhoids.  Family apparently had been reconsidering endoscopic evaluation with this admission.   Past Medical History:  Diagnosis Date   Anemia of chronic disease 07/08/2006   Antral ulcer 02/23/2007   Seen on EGD in 2008, small ulcer with erosion    Cerebral vascular accident (HCC) 04/14/2006   1998, left lower extremity numbness, no residual deficits    Essential hypertension 04/14/2006   Gastroesophageal reflux disease 02/18/2013   Occasional, symptomatically relieved with peptobismol    Healthcare maintenance 12/04/2011   Hyperlipidemia LDL goal < 100 04/14/2006   Hypertensive retinopathy of both eyes, grade 1 05/15/2016   Osteopenia of right femoral neck 10/20/2016   DEXA (10/16/2016): R femur T -2.5 (FRAX tool calculates at -2.4), L1-L4 spine T -0.9, 10 year risk for: Major osteoporotic fracture 8.3%, Hip fracture 2.7%   Seizure disorder (HCC) 03/01/2023   Type 2 diabetes mellitus with stage 1 chronic kidney disease, without long-term current use of insulin (HCC)    Type II diabetes mellitus (HCC) 04/14/2006    Past Surgical History:  Procedure Laterality Date  COLONOSCOPY     CRANIOTOMY Right 07/08/2022   Procedure: RIGHT CRANIOTOMY HEMATOMA EVACUATION SUBDURAL;  Surgeon: Julio Sicks, MD;  Location: [facility] OR;  Service: Neurosurgery;  Laterality: Right;   ESOPHAGOGASTRODUODENOSCOPY     ESOPHAGOGASTRODUODENOSCOPY (EGD) WITH PROPOFOL N/A 08/01/2022    Procedure: ESOPHAGOGASTRODUODENOSCOPY (EGD) WITH PROPOFOL;  Surgeon: Diamantina Monks, MD;  Location: [facility] ENDOSCOPY;  Service: General;  Laterality: N/A;   PEG PLACEMENT N/A 08/01/2022   Procedure: PERCUTANEOUS ENDOSCOPIC GASTROSTOMY (PEG) PLACEMENT;  Surgeon: Diamantina Monks, MD;  Location: [facility] ENDOSCOPY;  Service: General;  Laterality: N/A;    Prior to Admission medications   Medication Sig Start Date End Date Taking? Authorizing Provider  acetaminophen (TYLENOL) 500 MG tablet Place 500-1,000 mg into feeding tube See admin instructions. 2 entries on MAR: Give 1000mg  (2 tablet) via G-tube twice daily and 500 mg (1 tablet) every 6 hours as needed for pain.   Yes [provider]  amLODipine (NORVASC) 10 MG tablet Place 1 tablet (10 mg total) into feeding tube daily. 08/23/22  Yes Setzer, Lynnell Jude, PA-C  ascorbic acid (VITAMIN C) 500 MG tablet Place 1 tablet (500 mg total) into feeding tube daily. 08/23/22  Yes Setzer, Lynnell Jude, PA-C  atorvastatin (LIPITOR) 80 MG tablet Place 1 tablet (80 mg total) into feeding tube daily. 08/23/22  Yes Setzer, Lynnell Jude, PA-C  carvedilol (COREG) 6.25 MG tablet Place 1 tablet (6.25 mg total) into feeding tube 2 (two) times daily with a meal. 08/22/22  Yes Setzer, Lynnell Jude, PA-C  ezetimibe (ZETIA) 10 MG tablet Place 1 tablet (10 mg total) into feeding tube daily. 08/23/22  Yes Setzer, Lynnell Jude, PA-C  famotidine (PEPCID) 20 MG tablet Place 1 tablet (20 mg total) into feeding tube 2 (two) times daily. 08/22/22  Yes Setzer, Lynnell Jude, PA-C  Ferrous Sulfate (ONE VITE FERROUS SULFATE) 300 MG/6.8ML SOLN Give 9.27 mLs by tube daily.   Yes [provider]  FLUoxetine (PROZAC) 20 MG capsule Place 1 capsule (20 mg total) into feeding tube at bedtime. 08/22/22  Yes Setzer, Lynnell Jude, PA-C  gabapentin (NEURONTIN) 250 MG/5ML solution Place 2 mLs (100 mg total) into feeding tube every 8 (eight) hours. 08/22/22  Yes Setzer, Lynnell Jude, PA-C  guaiFENesin (ROBITUSSIN) 100 MG/5ML  liquid Place 100 mg into feeding tube 4 (four) times daily.   Yes [provider]  insulin lispro (HUMALOG) 100 UNIT/ML KwikPen Inject 0-16 Units into the skin See admin instructions. Inject 0-16 units 4 times daily per sliding scale: 70-150 : 0 units 151-200 : 3 units 201-250 : 6 units 251-300 : 9 units 301-350 : 12 units 351-400 : 16 units > 401 : notify MD   Yes [provider]  ipratropium-albuterol (DUONEB) 0.5-2.5 (3) MG/3ML SOLN Take 3 mLs by nebulization every 4 (four) hours as needed. 02/18/23  Yes Ghimire, Werner Lean, MD  l-methylfolate-B6-B12 (METANX) 3-35-2 MG TABS tablet Place 1 tablet into feeding tube daily. 08/23/22  Yes Setzer, Lynnell Jude, PA-C  levETIRAcetam (KEPPRA) 100 MG/ML solution Place 5 mLs (500 mg total) into feeding tube 2 (two) times daily. 01/22/23  Yes Micki Riley, MD  lidocaine 4 % Place 1 patch onto the skin See admin instructions. Apply 1 patch to neck and 1 patch to right knee topically in the morning and remove per schedule.   Yes [provider]  loperamide (IMODIUM A-D) 2 MG tablet Place 4 mg into feeding tube See admin instructions. Give 4 mg (2 tablet) via tube every 24  hours as needed for watery stools. Hold tube feeding for 4 hours for watery stools and administer medication.   Yes [provider]  metFORMIN (GLUCOPHAGE) 500 MG tablet Place 1 tablet (500 mg total) into feeding tube 2 (two) times daily with a meal. 08/22/22  Yes Setzer, Lynnell Jude, PA-C  metoCLOPramide (REGLAN) 5 MG tablet Place 5 mg into feeding tube every 8 (eight) hours as needed for nausea or vomiting.   Yes [provider]  Multiple Vitamins-Minerals (PRESERVISION AREDS 2) CHEW Give 1 each by tube 2 (two) times daily.   Yes [provider]  Nutritional Supplements (FEEDING SUPPLEMENT, GLUCERNA 1.5 CAL,) LIQD Place 1,000 mLs into feeding tube daily. Patient taking differently: Place 40 mL/hr into feeding tube continuous. 02/18/23  Yes  Ghimire, Werner Lean, MD  ondansetron (ZOFRAN) 4 MG tablet Take 4 mg by mouth every 8 (eight) hours as needed for nausea or vomiting.   Yes [provider]  OXYGEN Inhale 4 L/min into the lungs continuous.   Yes [provider]  Pantoprazole Sodium POWD Give 1 packet by tube See admin instructions. Pantoprazole DR suspension packets. Give 1 packet (40 mg) via G-tube once daily.   Yes [provider]  Water For Irrigation, Sterile (FREE WATER) SOLN Place 30-120 mLs into feeding tube See admin instructions. Place 120 mL into feeding tube every 4 hours/6 times a day. Flush tube with 30 mL water before and after giving medications through tube, three times a day.   Yes [provider]  amoxicillin-clavulanate (AUGMENTIN) 875-125 MG tablet Place 1 tablet into feeding tube 2 (two) times daily. Patient not taking: Reported on 04/27/2023    [provider]  doxycycline (ADOXA) 100 MG tablet Place 100 mg into feeding tube 2 (two) times daily. Patient not taking: Reported on 04/27/2023    [provider]    Current Facility-Administered Medications  Medication Dose Route Frequency Provider Last Rate Last Admin   acetaminophen (TYLENOL) tablet 650 mg  650 mg Per Tube Q6H PRN Atway, Rayann N, DO   650 mg at 04/27/23 2212   Or   acetaminophen (TYLENOL) suppository 650 mg  650 mg Rectal Q6H PRN Atway, Rayann N, DO       amLODipine (NORVASC) tablet 10 mg  10 mg Per Tube Daily Atway, Rayann N, DO   10 mg at 04/28/23 1033   ascorbic acid (VITAMIN C) tablet 500 mg  500 mg Per Tube Daily Atway, Rayann N, DO   500 mg at 04/28/23 1033   atorvastatin (LIPITOR) tablet 80 mg  80 mg Per Tube Daily Atway, Rayann N, DO   80 mg at 04/28/23 1033   carvedilol (COREG) tablet 6.25 mg  6.25 mg Per Tube BID WC Atway, Rayann N, DO   6.25 mg at 04/28/23 0817   ezetimibe (ZETIA) tablet 10 mg  10 mg Per Tube Daily Atway, Rayann N, DO   10 mg at 04/28/23 1033   famotidine (PEPCID)  tablet 20 mg  20 mg Per Tube BID Atway, Rayann N, DO   20 mg at 04/28/23 1033   feeding supplement (GLUCERNA 1.5 CAL) liquid  40 mL/hr Per Tube Q24H Ginnie Smart, MD   40 mL/hr at 04/28/23 0024   feeding supplement (PROSource TF20) liquid 60 mL  60 mL Per Tube Daily Ginnie Smart, MD   60 mL at 04/28/23 1327   ferrous sulfate 300 (60 Fe) MG/5ML syrup 300 mg  300 mg Per Tube Daily Atway,  Rayann N, DO   300 mg at 04/28/23 1033   FLUoxetine (PROZAC) 20 MG/5ML solution 20 mg  20 mg Per Tube QHS Atway, Rayann N, DO   20 mg at 04/27/23 2325   free water 120 mL  120 mL Per Tube Q4H Atway, Rayann N, DO   120 mL at 04/28/23 1205   gabapentin (NEURONTIN) 250 MG/5ML solution 100 mg  100 mg Per Tube Q8H Atway, Rayann N, DO   100 mg at 04/28/23 1500   guaiFENesin (ROBITUSSIN) 100 MG/5ML liquid 100 mg  100 mg Per Tube QID Atway, Rayann N, DO   100 mg at 04/28/23 1500   insulin aspart (novoLOG) injection 0-9 Units  0-9 Units Subcutaneous Q4H Morene Crocker, MD       levETIRAcetam (KEPPRA) 100 MG/ML solution 500 mg  500 mg Per Tube BID Atway, Rayann N, DO   500 mg at 04/28/23 1034   lidocaine (LIDODERM) 5 % 2 patch  2 patch Transdermal Daily Atway, Rayann N, DO   2 patch at 04/28/23 1034   metFORMIN (GLUCOPHAGE) tablet 500 mg  500 mg Per Tube BID WC Atway, Rayann N, DO   500 mg at 04/28/23 0817   pantoprazole (PROTONIX) injection 40 mg  40 mg Intravenous Q24H Ginnie Smart, MD   40 mg at 04/28/23 1034    Allergies as of 04/27/2023 - Review Complete 04/27/2023  Allergen Reaction Noted   Cozaar [losartan] Swelling 08/05/2022   Periogard [chlorhexidine gluconate] Swelling and Other (See Comments)    Pork allergy Other (See Comments) 02/13/2023   Vasotec [enalapril] Cough 09/02/2013    Family History  Problem Relation Age of Onset   Stroke Mother    Diabetes Mother    Hypertension Mother    Anuerysm Father 40       Cerebral   Asthma Sister    Breast cancer Sister    Pulmonary  disease Brother        Black lung   Stroke Sister    Cirrhosis Brother    Hypertension Son    Asthma Brother     Social History   Socioeconomic History   Marital status: Married    Spouse name: Not on file   Number of children: Not on file   Years of education: Not on file   Highest education level: Not on file  Occupational History   Occupation: Retired     Comment: [employer]  Tobacco Use   Smoking status: Former    Current packs/day: 0.00    Types: Cigarettes    Quit date: 06/30/1978    Years since quitting: 44.8   Smokeless tobacco: Never  Vaping Use   Vaping status: Never Used  Substance and Sexual Activity   Alcohol use: No   Drug use: No   Sexual activity: Not Currently  Other Topics Concern   Not on file  Social History Narrative     Current social history  Resides in [facility] since April 2024   Bedbound   Peg tube   Expressive aphasia   Droop            Current Social History 09/07/2020        Patient lives with husband in one level home with 5 outside steps with handrails on both sides        Patient's method of transportation is personal car (drives herself)      The highest level of education was 9 th grade  The patient currently retired from [employer] (due to [employer]).      Identified important Relationships are, "My family"       Pets : None       Interests / Fun: "Go to [employer], sing, read. Went to gym before [employer]."       Current Stressors: "I don't have any. 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Ducatte, BSN, RN-BC       Social Determinants of Health   Financial Resource Strain: Not on file  Food Insecurity: No Food Insecurity (04/27/2023)   Hunger Vital Sign    Worried About Running Out of Food in the Last Year: Never  true    Ran Out of Food in the Last Year: Never true  Transportation Needs: No Transportation Needs (04/27/2023)   PRAPARE - Administrator, Civil Service (Medical): No    Lack of Transportation (Non-Medical): No  Physical Activity: Not on file  Stress: Not on file  Social Connections: Not on file  Intimate Partner Violence: Not At Risk (04/27/2023)   Humiliation, Afraid, Rape, and Kick questionnaire    Fear of Current or Ex-Partner: No    Emotionally Abused: No    Physically Abused: No    Sexually Abused: No    Review of Systems: Patient unable to offer  Physical Exam: Vital signs in last 24 hours: Temp:  [97.7 F (36.5 C)-99 F (37.2 C)] 97.7 F (36.5 C) (10/29 1609) Pulse Rate:  [65-92] 92 (10/29 1609) Resp:  [16-20] 16 (10/29 1609) BP: (108-152)/(55-92) 149/69 (10/29 1609) SpO2:  [92 %-100 %] 94 % (10/29 1609) Weight:  [54.4 kg] 54.4 kg (10/28 1705) Last BM Date : 04/28/23 General:   Alert,  Well-developed, very elderly African-American female found sideways in the bed, halfway out of bed, trying to communicate but dysarthric ,cooperative in NAD Head:  Normocephalic and atraumatic. Eyes:  Sclera clear, no icterus.   Conjunctiva pink. Ears:  Normal auditory acuity. Nose:  No deformity, discharge,  or lesions. Mouth:  No deformity or lesions.   Neck:  Supple; no masses or thyromegaly. Lungs: Wet rhonchi anteriorly bilaterally   Heart:  Regular rate and rhythm; no murmurs, clicks, rubs,  or gallops. Abdomen:  Soft,nontender, BS active,nonpalp mass or hsm.  Gastrostomy  tube in place Rectal: Not done today, stool Hemoccult positive, rectal exam on admission without obvious melena or hematochezia Msk:  Symmetrical without gross deformities. . Pulses:  Normal pulses noted. Extremities:  Without clubbing or edema. Neurologic:  Alert , dysarthric, very difficult to understand, left facial droop, left-sided weakness Skin:  Intact without significant lesions or  rashes.. Psych:  Alert and cooperative.  Intake/Output from previous day: 10/28 0701 - 10/29 0700 In: 640 [I.V.:320; Blood:320] Out: -  Intake/Output this shift: No intake/output data recorded.  Lab Results: Recent Labs    04/27/23 1742 04/28/23 0558 04/28/23 1442  WBC 5.7 7.6  --   HGB 6.4* 8.4* 8.7*  HCT 21.1* 27.0* 27.0*  PLT 435* 453*  --    BMET Recent Labs    04/27/23 1742 04/28/23 0558  NA 134* 135  K 4.1 3.8  CL 97* 98  CO2 27 27  GLUCOSE 83 115*  BUN 20 19  CREATININE 0.70 0.61  CALCIUM 8.9 9.0   LFT Recent Labs    04/27/23 1742  PROT 7.2  ALBUMIN 2.0*  AST 62*  ALT 34  ALKPHOS 67  BILITOT 0.5   PT/INR Recent Labs    04/27/23 1742  LABPROT 14.8  INR 1.1   Hepatitis Panel No results for input(s): "HEPBSAG", "HCVAB", "HEPAIGM", "HEPBIGM" in the last 72 hours.    IMPRESSION:  #63 87 year old African-American female, nursing home resident, bedbound after a large subdural hematoma requiring craniotomy in February 2024 which was complicated by persistent dysarthria and hemiparesis #2 neurogenic dysphagia requiring gastrostomy tube feedings #3 chronic normocytic anemia, with baseline hemoglobin over the past several months in the 7.5-8.5 range History of iron deficiency, iron studies not totally consistent with iron deficiency currently Patient had a 1 g drop in hemoglobin over the past 6 weeks and was brought to the emergency room from her facility for management of the anemia  No report by the nursing home per admitting notes of any melena or hematochezia no nausea or vomiting.  Patient is not anticoagulated Rectal exam on admission negative but stool heme positive  Etiology of the heme positive stool is not clear, she may have some very slow chronic GI blood loss perhaps secondary to gastropathy, AVMs, versus other. CT chest abdomen pelvis September 2024 no evidence of any GI abnormality or malignancy  #4 hypertension #5.  Adult onset  diabetes mellitus #6.  History of chronic respiratory failure requiring 4 L to 5 L nasal cannula chronically prior to this admission-currently not on any oxygen and per nurse has been satting okay today  #7 hypoalbuminemia  Plan; would continue daily PPI empirically Check prealbumin, if low will need to have nutritionist reassess her daily caloric needs and protein needs Remains a very poor candidate for endoscopic evaluation, only endoscopy would be considered very unlikely to change our management.  She is certainly higher risk for complications with any sedation. No family in her room currently, will reach out to her daughter tomorrow morning but would not recommend proceeding with endoscopy.  Most appropriate management would be continued monitoring of hemoglobin, daily PPI and transfusion periodically if indicated.      Dino Borntreger PA-C 04/28/2023, 4:14 PM

## 2023-04-28 NOTE — Telephone Encounter (Signed)
Auth#: [id number] (04/28/23-06/29/24).

## 2023-04-28 NOTE — Progress Notes (Signed)
Initial Nutrition Assessment  DOCUMENTATION CODES:   Severe malnutrition in context of chronic illness  INTERVENTION:  Continue  tube feeding via PEG: Glucerna 1.5 at 40ml ml/h (960 ml per day) Prosource TF20 60 ml daily  Provides 1520 kcal, 99 gm protein, 1448 ml free water daily with FWF    NUTRITION DIAGNOSIS:   Severe Malnutrition related to chronic illness as evidenced by severe fat depletion, severe muscle depletion.    GOAL:   Patient will meet greater than or equal to 90% of their needs    MONITOR:   Skin, I & O's, Weight trends, TF tolerance  REASON FOR ASSESSMENT:   Consult Enteral/tube feeding initiation and management  ASSESSMENT: 87 y.o. F, admitted with acute on chronic anemia. Pmh; subdural hematoma s/p craniotomy 01/24, HTN, DM2, epilepsy, left side hemiparesis, dysarthria, dysphagia, non ambulatory. EN dependent with PEG placement.  Patient resting. No family at bedside. NFPE performed with findings of severe malnutrition. Patient resides at [facility]. PEG place earlier this year in relation to dysphagia, SLP following. Pump running at goal rate. With good tolerance reported.   Admit weight: 54.4 kg Current weight: 54.4 kg  Weight history: 04/27/23 54.4 kg  03/16/23 53.8 kg  03/02/23 53.1 kg  02/18/23 56.9 kg  01/06/23 54 kg  08/22/22 77 kg  08/02/22 97.3 kg      Average Meal Intake: NPO  Nutritionally Relevant Medications: Scheduled Meds:  ascorbic acid  500 mg Per Tube Daily   carvedilol  6.25 mg Per Tube BID WC   ezetimibe  10 mg Per Tube Daily   feeding supplement (GLUCERNA 1.5 CAL)  40 mL/hr Per Tube Q24H   ferrous sulfate  300 mg Per Tube Daily   free water  120 mL Per Tube Q4H   gabapentin  100 mg Per Tube Q8H   metFORMIN  500 mg Per Tube BID WC     Labs Reviewed: CBG ranges from 83-115 mg/dL over the last 24 hours HgbA1c 5.8 (02/14/23)    NUTRITION - FOCUSED PHYSICAL EXAM:  Flowsheet Row Most Recent Value  Orbital Region  Moderate depletion  Upper Arm Region Severe depletion  Thoracic and Lumbar Region Severe depletion  Buccal Region Severe depletion  Temple Region Severe depletion  Clavicle Bone Region Severe depletion  Clavicle and Acromion Bone Region Severe depletion  Scapular Bone Region Severe depletion  Dorsal Hand Severe depletion  Patellar Region Moderate depletion  Anterior Thigh Region Moderate depletion  Posterior Calf Region Severe depletion  Edema (RD Assessment) None  Hair Reviewed  Eyes Reviewed  Mouth Reviewed  Skin Reviewed  Nails Reviewed       Diet Order:   Diet Order             Diet NPO time specified  Diet effective now                   EDUCATION NEEDS:   Not appropriate for education at this time  Skin:  Skin Assessment: Reviewed RN Assessment  Last BM:  04/28/2023  Height:   Ht Readings from Last 1 Encounters:  04/27/23 5\' 4"  (1.626 m)    Weight:   Wt Readings from Last 1 Encounters:  04/27/23 54.4 kg    Ideal Body Weight:     BMI:  Body mass index is 20.6 kg/m.  Estimated Nutritional Needs:   Kcal:  1600-1909 kcal/day  Protein:  70-80 g  Fluid:  43ml/kcal    Jamelle Haring RDN, LDN Clinical Dietitian  RDN pager # available on Amion

## 2023-04-28 NOTE — Progress Notes (Signed)
HD#0 SUBJECTIVE:  Patient Summary: Gina Kelley is a 87 y.o. with a pertinent PMH of  recent ICH/SDH 07/2022, iron deficiency anemia, PUD admitted for worsening weakness found to have a hemoglobin of 6.4.   Overnight Events: NAEO  Interim History: Patient slept well overnight, would like to eat. Family is wondering what could be done to know the etiology of the bleed.   OBJECTIVE:  Vital Signs: Vitals:   04/27/23 2035 04/27/23 2132 04/28/23 0136 04/28/23 0411  BP: (!) 152/72 138/65 (!) 115/55 (!) 140/70  Pulse: 72 73 76 73  Resp: 18 18 16 18   Temp:  98.4 F (36.9 C) 98.2 F (36.8 C) 99 F (37.2 C)  TempSrc:  Oral Oral Oral  SpO2: 96% 93% 100% 92%  Weight:      Height:       Supplemental O2: Room Air SpO2: 92 %  Filed Weights   04/27/23 1705  Weight: 54.4 kg    Intake/Output Summary (Last 24 hours) at 04/28/2023 0700 Last data filed at 04/27/2023 2034 Gross per 24 hour  Intake 640 ml  Output --  Net 640 ml   Net IO Since Admission: 640 mL [04/28/23 0700]  Physical Exam: Physical Exam Constitutional:      General: She is not in acute distress.    Appearance: She is ill-appearing. She is not toxic-appearing.  HENT:     Mouth/Throat:     Mouth: Mucous membranes are dry.  Eyes:     Conjunctiva/sclera: Conjunctivae normal.  Cardiovascular:     Rate and Rhythm: Normal rate and regular rhythm.     Heart sounds: No murmur heard.    No friction rub. No gallop.  Pulmonary:     Effort: Pulmonary effort is normal. No respiratory distress.     Breath sounds: No wheezing, rhonchi or rales.  Abdominal:     General: Abdomen is flat.     Palpations: Abdomen is soft.     Tenderness: There is abdominal tenderness in the epigastric area.  Musculoskeletal:     Right lower leg: No edema.     Left lower leg: No edema.  Skin:    General: Skin is warm.     Capillary Refill: Capillary refill takes less than 2 seconds.     Coloration: Skin is not jaundiced.   Neurological:     Mental Status: She is alert.     Patient Lines/Drains/Airways Status     Active Line/Drains/Airways     Name Placement date Placement time Site Days   Peripheral IV 04/27/23 20 G Anterior;Right Forearm 04/27/23  1802  Forearm  1   Gastrostomy/Enterostomy PEG-jejunostomy 24 Fr. LLQ 08/01/22  1044  LLQ  270             ASSESSMENT/PLAN:  Assessment: Principal Problem:   Acute on chronic anemia Active Problems:   Hyperlipidemia   Essential hypertension   Gastroesophageal reflux disease without esophagitis   Plan: #Acute on Chronic Anemia  #Hx of PUD  #Hx of IDA  Presented with Hgb of 6.4 with increased RDW. Last EGD on file from 2008 w/ bx cw PUP, negative H. Pylori. Ferritin 29. Per [facility] Daily she needs 970 mg of iron. Obtained one unit of blood, will give an additional 500 mg IV Iron. She is HDS. Will repeat H&H this afternoon. Family would like to speak to GI to see if the cause of the bleed could be found. She is tender in the epigastric region. This is likely  related to her peptic ulcer disease in addition to IDA. Less likely lower GI given digital exam is negative for BRB, dark stools may be related to her iron supplementation. Discussed that we may not always know what it is causing the bleed despite getting an EGD done. She is an 87 yo with multiple co-morbidities. Advised that alternatively we could treat symptomatically with PPI and monitor CBCs. Unsure if she would be eligible for EGD given worsening neurological decline. Will consult GI to weigh in on their recommendations.   -Continue with Pantoprazole 40 mg IV BID  -Continue with ferrous sulfate 408 mg through feeds  -GI consulted  History of ICH/SDH s/p craniotomy Dysphagia Has left hemiparesis, dysarthria, facial droop and dysphagia. Is stable but will benefit from PT/OT while inpatient. -PT/OT  #Severe Malnutrition  Pt has a PEG tube after her SDH 07/2022. Since then gotten extensive  work-up by SLP, has severe dysphagia. Was getting pureed foods through her tube and feeds through PEG at night until she had aspiration PNA in September. [name] does not have progressive care. After her aspiration event SLP evaluated and it was recommended that patient would remain NPO.  -RD consult placed, appreciate their recs and assistance with her feeds.   # Essential hypertension  BP in the 140s today.  -Cw Amlodipine 10 mg  -Cw Coreg 6.25 mg.    # Diabetes mellitus type 2, with long-term use of insulin  - Monitor CBGs  -  cw metformin 500 mg  -  cw SSI four times daily    # Seizure disorder -Continue Keppra   # Hyperlipidemia  Cw Lipitor 80 mg.  Cw Zetia 10 mg.   # Depression -Continue Prozac 20 mg.     Best Practice: Diet: Peg IVF: Fluids: Free fluids.  VTE: SCDs Start: 04/27/23 2032 Code: DNR/DNI Therapy Recs: [name] DISPO: Anticipated discharge 2 days to Skilled nursing facility pending  medical workup .  Signature: [facility]  Internal Medicine Resident, PGY-1 [facility]  Pager: [phone] 7:00 AM, 04/28/2023   Please contact the on call pager after 5 pm and on weekends at [phone].

## 2023-04-28 NOTE — Progress Notes (Signed)
Pt received to 2 west room 34 from ED.  Pt and daughter, Gina Kelley, oriented to room and call bell.  See admission data base, vs's, and assessment for details.  2 stage 2 old healing ulcers on buttocks.  Pt's daughter stated that they would like to keep her in adult diapers while here.  Mepilex placed to buttocks.  Pt c/o 3/10 rt knee and neck pain.  NSR on monitor.

## 2023-04-29 DIAGNOSIS — E1149 Type 2 diabetes mellitus with other diabetic neurological complication: Secondary | ICD-10-CM | POA: Diagnosis not present

## 2023-04-29 DIAGNOSIS — K922 Gastrointestinal hemorrhage, unspecified: Secondary | ICD-10-CM

## 2023-04-29 DIAGNOSIS — R195 Other fecal abnormalities: Secondary | ICD-10-CM | POA: Diagnosis not present

## 2023-04-29 DIAGNOSIS — D509 Iron deficiency anemia, unspecified: Secondary | ICD-10-CM | POA: Diagnosis not present

## 2023-04-29 DIAGNOSIS — D649 Anemia, unspecified: Secondary | ICD-10-CM | POA: Diagnosis not present

## 2023-04-29 DIAGNOSIS — I158 Other secondary hypertension: Secondary | ICD-10-CM | POA: Diagnosis not present

## 2023-04-29 DIAGNOSIS — D62 Acute posthemorrhagic anemia: Secondary | ICD-10-CM | POA: Diagnosis not present

## 2023-04-29 LAB — CBC
HCT: 26.7 % — ABNORMAL LOW (ref 36.0–46.0)
Hemoglobin: 8.2 g/dL — ABNORMAL LOW (ref 12.0–15.0)
MCH: 27 pg (ref 26.0–34.0)
MCHC: 30.7 g/dL (ref 30.0–36.0)
MCV: 87.8 fL (ref 80.0–100.0)
Platelets: 408 10*3/uL — ABNORMAL HIGH (ref 150–400)
RBC: 3.04 MIL/uL — ABNORMAL LOW (ref 3.87–5.11)
RDW: 17.9 % — ABNORMAL HIGH (ref 11.5–15.5)
WBC: 7.1 10*3/uL (ref 4.0–10.5)
nRBC: 0 % (ref 0.0–0.2)

## 2023-04-29 LAB — RENAL FUNCTION PANEL
Albumin: 2 g/dL — ABNORMAL LOW (ref 3.5–5.0)
Anion gap: 11 (ref 5–15)
BUN: 16 mg/dL (ref 8–23)
CO2: 27 mmol/L (ref 22–32)
Calcium: 8.6 mg/dL — ABNORMAL LOW (ref 8.9–10.3)
Chloride: 94 mmol/L — ABNORMAL LOW (ref 98–111)
Creatinine, Ser: 0.49 mg/dL (ref 0.44–1.00)
GFR, Estimated: >60 mL/min (ref >60)
Glucose, Bld: 121 mg/dL — ABNORMAL HIGH (ref 70–99)
Phosphorus: 2.3 mg/dL — ABNORMAL LOW (ref 2.5–4.6)
Potassium: 3.7 mmol/L (ref 3.5–5.1)
Sodium: 132 mmol/L — ABNORMAL LOW (ref 135–145)

## 2023-04-29 LAB — PREALBUMIN: Prealbumin: 12 mg/dL — ABNORMAL LOW (ref 18–38)

## 2023-04-29 LAB — GLUCOSE, CAPILLARY
Glucose-Capillary: 100 mg/dL — ABNORMAL HIGH (ref 70–99)
Glucose-Capillary: 107 mg/dL — ABNORMAL HIGH (ref 70–99)
Glucose-Capillary: 113 mg/dL — ABNORMAL HIGH (ref 70–99)
Glucose-Capillary: 118 mg/dL — ABNORMAL HIGH (ref 70–99)
Glucose-Capillary: 119 mg/dL — ABNORMAL HIGH (ref 70–99)
Glucose-Capillary: 121 mg/dL — ABNORMAL HIGH (ref 70–99)
Glucose-Capillary: 135 mg/dL — ABNORMAL HIGH (ref 70–99)

## 2023-04-29 LAB — MAGNESIUM: Magnesium: 1.8 mg/dL (ref 1.7–2.4)

## 2023-04-29 MED ORDER — K PHOS MONO-SOD PHOS DI & MONO 155-852-130 MG PO TABS
250.0000 mg | ORAL_TABLET | Freq: Once | ORAL | Status: AC
  Administered 2023-04-29: 250 mg via ORAL
  Filled 2023-04-29: qty 1

## 2023-04-29 NOTE — Plan of Care (Signed)
  Problem: Fluid Volume: Goal: Hemodynamic stability will improve Outcome: Progressing   Problem: Clinical Measurements: Goal: Diagnostic test results will improve Outcome: Progressing Goal: Signs and symptoms of infection will decrease Outcome: Progressing   Problem: Respiratory: Goal: Ability to maintain adequate ventilation will improve Outcome: Progressing   Problem: Education: Goal: Knowledge of General Education information will improve Description: Including pain rating scale, medication(s)/side effects and non-pharmacologic comfort measures Outcome: Progressing   Problem: Health Behavior/Discharge Planning: Goal: Ability to manage health-related needs will improve Outcome: Progressing   Problem: Clinical Measurements: Goal: Ability to maintain clinical measurements within normal limits will improve Outcome: Progressing Goal: Will remain free from infection Outcome: Progressing Goal: Diagnostic test results will improve Outcome: Progressing Goal: Respiratory complications will improve Outcome: Progressing Goal: Cardiovascular complication will be avoided Outcome: Progressing   Problem: Activity: Goal: Risk for activity intolerance will decrease Outcome: Progressing   Problem: Nutrition: Goal: Adequate nutrition will be maintained Outcome: Progressing   Problem: Coping: Goal: Level of anxiety will decrease Outcome: Progressing   Problem: Elimination: Goal: Will not experience complications related to bowel motility Outcome: Progressing Goal: Will not experience complications related to urinary retention Outcome: Progressing   Problem: Pain Management: Goal: General experience of comfort will improve Outcome: Progressing   Problem: Safety: Goal: Ability to remain free from injury will improve Outcome: Progressing   Problem: Skin Integrity: Goal: Risk for impaired skin integrity will decrease Outcome: Progressing   Problem: Education: Goal:  Ability to describe self-care measures that may prevent or decrease complications (Diabetes Survival Skills Education) will improve Outcome: Progressing Goal: Individualized Educational Video(s) Outcome: Progressing   Problem: Coping: Goal: Ability to adjust to condition or change in health will improve Outcome: Progressing   Problem: Fluid Volume: Goal: Ability to maintain a balanced intake and output will improve Outcome: Progressing   Problem: Health Behavior/Discharge Planning: Goal: Ability to identify and utilize available resources and services will improve Outcome: Progressing Goal: Ability to manage health-related needs will improve Outcome: Progressing   Problem: Metabolic: Goal: Ability to maintain appropriate glucose levels will improve Outcome: Progressing   Problem: Nutritional: Goal: Maintenance of adequate nutrition will improve Outcome: Progressing Goal: Progress toward achieving an optimal weight will improve Outcome: Progressing   Problem: Skin Integrity: Goal: Risk for impaired skin integrity will decrease Outcome: Progressing   Problem: Tissue Perfusion: Goal: Adequacy of tissue perfusion will improve Outcome: Progressing

## 2023-04-29 NOTE — Progress Notes (Addendum)
HD#1 SUBJECTIVE:  Patient Summary: Gina Kelley is a 87 y.o.   Overnight Events: NAEO  Interim History: Family wondering about another speech and swallow as she was able to tolerate pureed foods prior to her aspiration event in September.  OBJECTIVE:  Vital Signs: Vitals:   04/29/23 0018 04/29/23 0332 04/29/23 0753 04/29/23 0948  BP: (!) 142/73 (!) 153/74 (!) 140/65 (!) 139/59  Pulse: 80 81 82 79  Resp: 17 17    Temp: 97.7 F (36.5 C) 97.8 F (36.6 C) 97.8 F (36.6 C)   TempSrc: Oral Oral    SpO2: 95% 94% 93%   Weight:      Height:       Supplemental O2: Room Air SpO2: 93 %  Filed Weights   04/27/23 1705  Weight: 54.4 kg     Intake/Output Summary (Last 24 hours) at 04/29/2023 1333 Last data filed at 04/29/2023 0300 Gross per 24 hour  Intake 2180.15 ml  Output --  Net 2180.15 ml   Net IO Since Admission: 2,820.15 mL [04/29/23 1333]  Physical Exam: Physical Exam Constitutional:      General: She is not in acute distress.    Appearance: She is ill-appearing. She is not toxic-appearing.  HENT:     Nose: No congestion.     Mouth/Throat:     Mouth: Mucous membranes are moist.  Cardiovascular:     Rate and Rhythm: Normal rate and regular rhythm.     Heart sounds: S1 normal and S2 normal. No murmur heard.    No friction rub. No gallop.  Pulmonary:     Effort: Pulmonary effort is normal. No accessory muscle usage or respiratory distress.     Comments: Exam limited to anterior lungs  Musculoskeletal:     Right lower leg: No edema.     Left lower leg: No edema.  Neurological:     Mental Status: She is alert.    Patient Lines/Drains/Airways Status     Active Line/Drains/Airways     Name Placement date Placement time Site Days   Peripheral IV 04/27/23 20 G Anterior;Right Forearm 04/27/23  1802  Forearm  2   Gastrostomy/Enterostomy PEG-jejunostomy 24 Fr. LLQ 08/01/22  1044  LLQ  271             ASSESSMENT/PLAN:  Assessment: Principal  Problem:   Acute on chronic anemia Active Problems:   Hyperlipidemia   Essential hypertension   Gastroesophageal reflux disease without esophagitis   Other secondary hypertension   Protein-calorie malnutrition, severe   Gastrointestinal hemorrhage   Plan:  #Acute on Chronic Anemia  #Hx of PUD  #Hx of IDA  Appreciate GI's involvement with this case. Hgb has been stable overnight. Conservative measurement for now as patient would be high risk for EGD at this time. -Continue with Pantoprazole 40 mg daily -Add Pepcid 20 mg suspension every afternoon  -Continue with ferrous sulfate 408 mg through feeds    History of ICH/SDH s/p craniotomy Dysphagia Has left hemiparesis, dysarthria, facial droop and dysphagia. Is stable but will benefit from PT/OT while inpatient. -PT/OT   #Severe Malnutrition  Pt has a PEG tube after her SDH 07/2022. Since then gotten extensive work-up by SLP, has severe dysphagia. Was getting pureed foods through her tube and feeds through PEG at night until she had aspiration PNA in September. After her aspiration event SLP evaluated and it was recommended that patient would remain NPO. Clarified with family about progressive care and this was with regards to  PT/OT and not speech therapy. Family requesting SLP eval. Appreciate GI.  -SLP eval  -Continue  tube feeding via PEG for now:             Glucerna 1.5 at 40ml ml/h (960 ml per day)             Prosource TF20 60 ml daily             FWF at q4hrs  -Monitor for refeeding K >4 Mg >2 Phosphorus >2 - replete as needed   # Essential hypertension  BP in the 140s today.  -Cw Amlodipine 10 mg  -Cw Coreg 6.25 mg.    # Diabetes mellitus type 2, with long-term use of insulin  - Monitor CBGs  -  cw metformin 500 mg  -  cw SSI four times daily    # Seizure disorder -Continue Keppra   # Hyperlipidemia  - Cw Lipitor 80 mg.  - Cw Zetia 10 mg.   # Depression -Continue Prozac 20 mg.   Best Practice: Diet:  Peg IVF: Fluids: Free fluids.  VTE: SCDs Start: 04/27/23 2032 Code: DNR/DNI Therapy Recs: [name] DISPO: Anticipated discharge 1 day to Skilled nursing facility pending medical workup . Family updated today   Signature: [facility]  Internal Medicine Resident, PGY-1 [facility]  Pager: [phone] 1:33 PM, 04/29/2023   Please contact the on call pager after 5 pm and on weekends at [phone].

## 2023-04-29 NOTE — Progress Notes (Signed)
Patient ID: Gina Kelley, female   DOB: 1936-01-29, 87 y.o.   MRN: [id number]    Progress Note   Subjective  Day # 2 CC;anemia  Prealbumin 12 Sodium 132/potassium 3.7/BUN 16/creatinine 0.49 WBC 7.1/hemoglobin 8.2//hematocrit 26.7-stable posttransfusion  Patient is alert, talking but difficult to understand, had asked "is she giving you a hard time" mother was speaking with her daughter Discussion with daughter at bedside today regarding question of proceeding with any further GI evaluation  No evidence for any active GI bleeding-complaints of abdominal pain Continues on tube feeds at 40 cc an hour-addition recommended of Prosource. Daughter asking about retesting her swallowing as previously she had been on pured diet but apparently failed swallowing eval during her last hospitalization in September and has since been just on tube feeds.   Objective   Vital signs in last 24 hours: Temp:  [97.7 F (36.5 C)-98.3 F (36.8 C)] 97.8 F (36.6 C) (10/30 0753) Pulse Rate:  [79-92] 79 (10/30 0948) Resp:  [16-17] 17 (10/30 0332) BP: (130-153)/(59-74) 139/59 (10/30 0948) SpO2:  [93 %-99 %] 93 % (10/30 0753) Last BM Date : 04/28/23 General:    Elderly African-American female in NAD, talking, dysarthric Heart:  Regular rate and rhythm; no murmurs Lungs: Respirations even and unlabored, lungs CTA bilaterally Abdomen:  Soft, nontender and nondistended. Normal bowel sounds.  G-tube in place Extremities:  Without edema. Neurologic:  Alert and oriented, severe dysarthria, hemiparesis Psych:  Cooperative. Normal mood and affect.  Intake/Output from previous day: 10/29 0701 - 10/30 0700 In: 2180.2 [NG/GT:1904; IV Piggyback:276.2] Out: -  Intake/Output this shift: No intake/output data recorded.  Lab Results: Recent Labs    04/27/23 1742 04/28/23 0558 04/28/23 1442 04/29/23 0656  WBC 5.7 7.6  --  7.1  HGB 6.4* 8.4* 8.7* 8.2*  HCT 21.1* 27.0* 27.0* 26.7*  PLT 435* 453*  --   408*   BMET Recent Labs    04/27/23 1742 04/28/23 0558 04/29/23 0656  NA 134* 135 132*  K 4.1 3.8 3.7  CL 97* 98 94*  CO2 27 27 27   GLUCOSE 83 115* 121*  BUN 20 19 16   CREATININE 0.70 0.61 0.49  CALCIUM 8.9 9.0 8.6*   LFT Recent Labs    04/27/23 1742 04/29/23 0656  PROT 7.2  --   ALBUMIN 2.0* 2.0*  AST 62*  --   ALT 34  --   ALKPHOS 67  --   BILITOT 0.5  --    PT/INR Recent Labs    04/27/23 1742  LABPROT 14.8  INR 1.1         Assessment / Plan:    #28 87 year old African-American female, nursing home resident, bedbound currently after large subdural hematoma requiring craniotomy and February 2024, complicated by persistent dysarthria and hemiparesis.  She is also had significant neurogenic dysphagia and is currently being fed just with G-tube feedings after failing swallowing evaluation during her last hospitalization. She was sent to the emergency room after routine labs at her nursing home revealed a hemoglobin of 6.3. There had not been any report of any melena or hematochezia, and she has been on empiric PPI  Stool Hemoccult positive here Hemoglobin stable after transfusions  CT chest abdomen and pelvis September 2024 reviewed and no evidence of any GI abnormality or malignancy  I had a long conversation with the patient's daughter at bedside today, discussed her current findings, and CT findings from September 2024 which are reassuring and argue against a GI malignancy causing  any GI blood loss. I discussed the potential complications of sedation for endoscopy and low likelihood of [place] finding an etiology that will change management She verbalizes understanding and understands that her mom is at increased risk of complications with any sedation.  Recommend to continue conservative management, continue daily Protonix powder 40 mg, and Pepcid 20 mg suspension every afternoon  She may require a periodic transfusion, but would continue to recommend against any  aggressive evaluation endoscopic evaluation unless she would develop obvious GI bleeding. Her is in agreement with this plan  #2 malnutrition secondary to above-dietitian has seen here and recommendations noted for tube feeding rate and addition of Prosource  #3 dysphagia-daughter is asking for speech path evaluation to reassess as mom had previously been able to tolerate pured  #4 chronic respiratory failure #5 hypertension #6.  Adult onset diabetes mellitus  Plan; as outlined above, no endoscopic evaluation Have asked speech path to see for bedside swallow eval today GI will sign off      Principal Problem:   Acute on chronic anemia Active Problems:   Hyperlipidemia   Essential hypertension   Gastroesophageal reflux disease without esophagitis   Other secondary hypertension   Protein-calorie malnutrition, severe   Gastrointestinal hemorrhage     LOS: 0 days   Hazley Dezeeuw EsterwoodPA-C  04/29/2023, 10:56 AM

## 2023-04-29 NOTE — TOC Transition Note (Signed)
Transition of Care [facility]) - CM/SW Discharge Note   Patient Details  Name: Gina Kelley MRN: [id number] Date of Birth: 1935-11-24  [facility] CM/SW Contact:  Marsalis Beaulieu A [place], LCSWA Phone Number: 04/29/2023, 11:32 AM   Clinical Narrative:     Patient will DC to: [facility] for LTC  Anticipated DC date:  04/29/23  Family notified: Gwenette Greet  Transport by: Sharin Mons      Per MD patient ready for DC to  [facility]. RN, patient, patient's family, and facility notified of DC. Discharge Summary and FL2 sent to facility. RN to call report prior to discharge [phone]). DC packet on chart. Ambulance transport requested for patient.     CSW will sign off for now as social work intervention is no longer needed. Please consult [place] again if new needs arise.   Final next level of care: Long Term Nursing Home Barriers to Discharge: Barriers Resolved   Patient Goals and CMS Choice      Discharge Placement                Patient chooses bed at: [facility] Patient to be transferred to facility by: [facility] Name of family member notified: Gwenette Greet Patient and family notified of of transfer: 04/29/23  Discharge Plan and Services Additional resources added to the After Visit Summary for                                       Social Determinants of Health (SDOH) Interventions SDOH Screenings   Food Insecurity: No Food Insecurity (04/27/2023)  Housing: Patient Unable To Answer (04/27/2023)  Transportation Needs: No Transportation Needs (04/27/2023)  Utilities: Not At Risk (04/27/2023)  Depression (PHQ2-9): Low Risk  (11/15/2021)  Tobacco Use: Medium Risk (04/28/2023)     Readmission Risk Interventions     No data to display

## 2023-04-29 NOTE — Evaluation (Signed)
Clinical/Bedside Swallow Evaluation Patient Details  Name: Gina Kelley MRN: [id number] Date of Birth: 1936-04-15  Today's Date: 04/29/2023 Time: SLP Start Time (ACUTE ONLY): 1510 SLP Stop Time (ACUTE ONLY): 1525 SLP Time Calculation (min) (ACUTE ONLY): 15 min  Past Medical History:  Past Medical History:  Diagnosis Date   Anemia of chronic disease 07/08/2006   Antral ulcer 02/23/2007   Seen on EGD in 2008, small ulcer with erosion    Cerebral vascular accident (HCC) 04/14/2006   1998, left lower extremity numbness, no residual deficits    Essential hypertension 04/14/2006   Gastroesophageal reflux disease 02/18/2013   Occasional, symptomatically relieved with peptobismol    Healthcare maintenance 12/04/2011   Hyperlipidemia LDL goal < 100 04/14/2006   Hypertensive retinopathy of both eyes, grade 1 05/15/2016   Osteopenia of right femoral neck 10/20/2016   DEXA (10/16/2016): R femur T -2.5 (FRAX tool calculates at -2.4), L1-L4 spine T -0.9, 10 year risk for: Major osteoporotic fracture 8.3%, Hip fracture 2.7%   Seizure disorder (HCC) 03/01/2023   Type 2 diabetes mellitus with stage 1 chronic kidney disease, without long-term current use of insulin (HCC)    Type II diabetes mellitus (HCC) 04/14/2006   Past Surgical History:  Past Surgical History:  Procedure Laterality Date   COLONOSCOPY     CRANIOTOMY Right 07/08/2022   Procedure: RIGHT CRANIOTOMY HEMATOMA EVACUATION SUBDURAL;  Surgeon: Julio Sicks, MD;  Location: [facility] OR;  Service: Neurosurgery;  Laterality: Right;   ESOPHAGOGASTRODUODENOSCOPY     ESOPHAGOGASTRODUODENOSCOPY (EGD) WITH PROPOFOL N/A 08/01/2022   Procedure: ESOPHAGOGASTRODUODENOSCOPY (EGD) WITH PROPOFOL;  Surgeon: Diamantina Monks, MD;  Location: [facility] ENDOSCOPY;  Service: General;  Laterality: N/A;   PEG PLACEMENT N/A 08/01/2022   Procedure: PERCUTANEOUS ENDOSCOPIC GASTROSTOMY (PEG) PLACEMENT;  Surgeon: Diamantina Monks, MD;  Location: [facility];  Service: General;   Laterality: N/A;   HPI:  Patient is an 87 y.o. female who presented on 10/29 from her nursing home after labs results revealed hemoglobin of 6.3. 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 PEG, anemia, chronic foley, HTN, GERD, T2DM, and HLD. Last seen by acute ST in September 2024 after aspiration event, NPO recommended at that time.    Assessment / Plan / Recommendation  Clinical Impression  Patient seen by SLP for bedside swallow evaluation. Patient was asleep when SLP entered the room but easily roused to sound of voice. Patient presented with significantly dysarthric speech with greatly reduced intelligibility. Cognition did not appear to be intact, but patient remained awake and alert throughout the session. SLP administered POs to determine readiness for instrumental swallow study. Patient was directly observed with ice chips, thin liquid, and puree. Swallow initiation was not triggered with any of the administered consistencies. PO slowly trickled out of oral cavity in most PO trials. Instances of delayed cough observed at least twice. Cough was wet sounding but unproductive. Patient demonstrated poor manipulation and awareness of boluses. SLP recommending patient remain NPO at this time. ST will continue to follow for PO trials to determine modified barium swallow study readiness. SLP Visit Diagnosis: Dysphagia, oropharyngeal phase (R13.12)    Aspiration Risk  Moderate aspiration risk    Diet Recommendation NPO    Medication Administration: Via alternative means    Other  Recommendations Oral Care Recommendations: Oral care BID;Staff/trained caregiver to provide oral care    Recommendations for follow up therapy are one component of a multi-disciplinary discharge planning process, led by the attending physician.  Recommendations may be updated based on patient status, additional functional criteria and insurance authorization.  Follow up  Recommendations Skilled nursing-short term rehab (<3 hours/day)      Assistance Recommended at Discharge    Functional Status Assessment Patient has had a recent decline in their functional status and demonstrates the ability to make significant improvements in function in a reasonable and predictable amount of time.  Frequency and Duration min 2x/week  2 weeks       Prognosis Prognosis for improved oropharyngeal function: Guarded Barriers to Reach Goals: Cognitive deficits;Time post onset;Severity of deficits      Swallow Study   General Date of Onset: 04/28/23 HPI: Patient is an 87 y.o. feamle who preseted on 10/29 from her nursing home after labs results revealed hemoglobin of 6.3. 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 PEG, anemia, chronic foley, HTN, GERD, T2DM, and HLD. Last seen by acute ST in September 2024 after aspiration event, NPO reccommended at that time. Type of Study: Bedside Swallow Evaluation Previous Swallow Assessment: 09/24 Diet Prior to this Study: NPO;G-tube Temperature Spikes Noted: No Respiratory Status: Room air History of Recent Intubation: No Behavior/Cognition: Alert;Cooperative;Confused Oral Cavity Assessment: Within Functional Limits;Other (comment) (white sore on inner, bottom lip) Oral Care Completed by SLP: Yes Oral Cavity - Dentition: Edentulous Vision: Functional for self-feeding Self-Feeding Abilities: Total assist Patient Positioning: Upright in bed Baseline Vocal Quality: Normal Volitional Cough: Weak;Congested Volitional Swallow: Unable to elicit    Oral/Motor/Sensory Function Overall Oral Motor/Sensory Function: Moderate impairment Facial ROM: Reduced left Facial Symmetry: Abnormal symmetry left Facial Strength: Reduced left Lingual ROM: Reduced left Lingual Strength: Reduced Lingual Sensation: Reduced   Ice Chips Ice chips: Impaired Presentation: Spoon Oral Phase Impairments: Impaired  mastication;Poor awareness of bolus Oral Phase Functional Implications: Left anterior spillage Pharyngeal Phase Impairments: Unable to trigger swallow;Cough - Immediate   Thin Liquid Thin Liquid: Impaired Presentation: Spoon Oral Phase Impairments: Reduced labial seal;Poor awareness of bolus Oral Phase Functional Implications: Left anterior spillage Pharyngeal  Phase Impairments: Unable to trigger swallow    Nectar Thick     Honey Thick     Puree Puree: Impaired Presentation: Spoon Oral Phase Impairments: Reduced labial seal;Poor awareness of bolus Oral Phase Functional Implications: Right anterior spillage Pharyngeal Phase Impairments: Unable to trigger swallow   Solid     Solid: Not tested     Marline Backbone, B.S., Speech Therapy Student   04/29/2023,4:06 PM

## 2023-04-29 NOTE — Plan of Care (Signed)
Patient without acute distress tonight. Slept well. Repositioned frequently. All needs attended to. Will continue to monitor.   Problem: Fluid Volume: Goal: Hemodynamic stability will improve Outcome: Progressing   Problem: Clinical Measurements: Goal: Diagnostic test results will improve Outcome: Progressing Goal: Signs and symptoms of infection will decrease Outcome: Progressing   Problem: Respiratory: Goal: Ability to maintain adequate ventilation will improve Outcome: Progressing   Problem: Clinical Measurements: Goal: Ability to maintain clinical measurements within normal limits will improve Outcome: Progressing

## 2023-04-30 ENCOUNTER — Observation Stay (HOSPITAL_COMMUNITY): Payer: Medicare HMO

## 2023-04-30 DIAGNOSIS — I158 Other secondary hypertension: Secondary | ICD-10-CM | POA: Diagnosis not present

## 2023-04-30 DIAGNOSIS — D62 Acute posthemorrhagic anemia: Secondary | ICD-10-CM | POA: Diagnosis not present

## 2023-04-30 DIAGNOSIS — K922 Gastrointestinal hemorrhage, unspecified: Secondary | ICD-10-CM | POA: Diagnosis not present

## 2023-04-30 DIAGNOSIS — R531 Weakness: Secondary | ICD-10-CM | POA: Diagnosis not present

## 2023-04-30 DIAGNOSIS — E1149 Type 2 diabetes mellitus with other diabetic neurological complication: Secondary | ICD-10-CM | POA: Diagnosis not present

## 2023-04-30 DIAGNOSIS — Z7401 Bed confinement status: Secondary | ICD-10-CM | POA: Diagnosis not present

## 2023-04-30 DIAGNOSIS — D649 Anemia, unspecified: Secondary | ICD-10-CM | POA: Diagnosis not present

## 2023-04-30 LAB — RENAL FUNCTION PANEL
Albumin: 2 g/dL — ABNORMAL LOW (ref 3.5–5.0)
Anion gap: 9 (ref 5–15)
BUN: 14 mg/dL (ref 8–23)
CO2: 27 mmol/L (ref 22–32)
Calcium: 8.8 mg/dL — ABNORMAL LOW (ref 8.9–10.3)
Chloride: 98 mmol/L (ref 98–111)
Creatinine, Ser: 0.45 mg/dL (ref 0.44–1.00)
GFR, Estimated: >60 mL/min (ref >60)
Glucose, Bld: 110 mg/dL — ABNORMAL HIGH (ref 70–99)
Phosphorus: 2.4 mg/dL — ABNORMAL LOW (ref 2.5–4.6)
Potassium: 3.4 mmol/L — ABNORMAL LOW (ref 3.5–5.1)
Sodium: 134 mmol/L — ABNORMAL LOW (ref 135–145)

## 2023-04-30 LAB — GLUCOSE, CAPILLARY
Glucose-Capillary: 85 mg/dL (ref 70–99)
Glucose-Capillary: 111 mg/dL — ABNORMAL HIGH (ref 70–99)
Glucose-Capillary: 131 mg/dL — ABNORMAL HIGH (ref 70–99)
Glucose-Capillary: 111 mg/dL — ABNORMAL HIGH (ref 70–99)

## 2023-04-30 MED ORDER — GLUCERNA 1.5 CAL PO LIQD: 40.0000 mL/h | ORAL | Status: DC

## 2023-04-30 MED ORDER — FAMOTIDINE 20 MG PO TABS: 20.0000 mg | ORAL_TABLET | Freq: Every day | ORAL | Status: DC

## 2023-04-30 MED ORDER — FAMOTIDINE 20 MG PO TABS: 20.0000 mg | ORAL_TABLET | Freq: Every day | ORAL | Status: AC

## 2023-04-30 MED ORDER — PROSOURCE TF20 ENFIT COMPATIBL EN LIQD: 60.0000 mL | Freq: Every day | ENTERAL | Status: DC

## 2023-04-30 MED ORDER — POTASSIUM CHLORIDE 20 MEQ PO PACK
60.0000 meq | PACK | Freq: Once | ORAL | Status: AC
  Administered 2023-04-30: 60 meq
  Filled 2023-04-30: qty 3

## 2023-04-30 MED ORDER — POTASSIUM CHLORIDE CRYS ER 20 MEQ PO TBCR
40.0000 meq | EXTENDED_RELEASE_TABLET | Freq: Once | ORAL | Status: DC
  Filled 2023-04-30: qty 2

## 2023-04-30 NOTE — Discharge Instructions (Addendum)
Ms. Legate,  Thank you for allowing [place] to take care of you. You were in the hospital because you were weak and your hemoglobin was found to be 6.4. You received a unit of blood and IV iron. Your hemoglobin levels have remained stable during your hospitalization and you are ready to go back to [name].   Please ensure Ms. Leitz continues on: -pantoprazole 40mg  daily -pepcid 20mg  in the afternoon -ferrous sulfate 408 mg through feeds    Ms. Cardoza is severely malnourished. A nutrition consult was placed and a speech and swallow evaluation was done.   Continue  tube feeding via PEG: Glucerna 1.5 at 40ml ml/h (960 ml per day) Prosource TF20 60 ml daily Free water feeds at q4hrs   This Provides 1520 kcal, 99 gm protein, 1448 ml free water daily with FWF.   A speech and swallow evaluation was done the day of her discharge and she was able to tolerate nectar thick food products. She can have purees and nectars for pleasure at this time. Please continue to monitor her progress. Repeat speech and swallow eval in 8-10 weeks. She can also follow up with [place] after being discharged [name]. Please monitor for symptoms of aspiration.  For her diabetes, she is also on a short sliding scale 4 times days  and metformin. Please ensure she continued with the same.   She is to continue taking her other medications for her other conditions as outlined in the rest of this document.   Of note, PT/OT should be continued on this 87 yo who has been having multiple recent hospitalizations and hx of subdural hematoma.   Thank you so much.   Sincerely,  Manuela Neptune, MD  PGY-1

## 2023-04-30 NOTE — Evaluation (Signed)
Modified Barium Swallow Study  Patient Details  Name: ROKAYA BOGACZ MRN: [id number] Date of Birth: 08-30-1935  Today's Date: 04/30/2023  Modified Barium Swallow completed.  Full report located under Chart Review in the Imaging Section.  History of Present Illness Patient is an 87 y.o. female who presented on 10/29 from her nursing home after labs results revealed hemoglobin of 6.3. 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 PEG, anemia, chronic foley, HTN, GERD, T2DM, and HLD. Last seen by acute ST in September 2024 after aspiration event, NPO reccommended at that time. Pt has not had MBS since Feb 2024 admission.   Clinical Impression Ms. Herget presents with a moderate oral dysphagia, mild pharyngeal dysphagia.  She demonstrates continued sensorimotor deficits impacting lip seal and tongue control and leading to prolonged oral preparation and difficulty with bolus containment. Thin liquids spilled into pharynx and then were aspirated prior to onset of the pharyngeal swallow - aspiration was trace and elicited a strong cough response. Nectar thick liquids penetrated but were ejected from the larynx during completion of the swallow (penetration/aspiration scale score of 2, considered WFL). There was complete laryngeal vestibule closure, adequate pharyngeal and base-of-tongue contraction that propelled the barium through the UES, leaving no residue behind.  Recommend that pt resume an oral diet - start with dysphagia 1; nectar thick liquids.  Of note, there was occasional coughing during the study that was not related to aspiration. Pt would benefit from tx focused on lip seal around straw/spoon, lingual strengthening, water protocol/ice chips with SLP supervision. Recommend returning as an OP for MBS in 8-10 weeks. Discussed with dtr, [name], and Dr. Hassan Rowan, MD. Factors that may increase risk of adverse event in presence of aspiration Rubye Oaks &  Clearance Coots 2021):    Swallow Evaluation Recommendations Recommendations: PO diet PO Diet Recommendation: Dysphagia 1 (Pureed);Mildly thick liquids (Level 2, nectar thick) Liquid Administration via: Cup;Spoon;Straw Medication Administration: Via alternative means Supervision: Staff to assist with self-feeding Swallowing strategies  : Minimize environmental distractions;Check for pocketing or oral holding;Check for anterior loss Oral care recommendations: Oral care QID (4x/day);Oral care before ice chips/water   Ollie Esty L. Samson Frederic, MA CCC/SLP Clinical Specialist - Acute Care SLP Acute Rehabilitation Services Office number [phone]    Blenda Mounts Laurice 04/30/2023,3:26 PM

## 2023-04-30 NOTE — Discharge Summary (Addendum)
Name: Gina Kelley MRN: [id number] DOB: 19-Jul-1935 87 y.o. PCP: Patient, No Pcp Per  Date of Admission: 04/27/2023  4:44 PM Date of Discharge:  04/30/2023 Attending Physician: Dr.  Johny Sax  DISCHARGE DIAGNOSIS:  Primary Problem: Acute on chronic anemia   Hospital Problems: Principal Problem:   Acute on chronic anemia Active Problems:   Hyperlipidemia   Essential hypertension   Gastroesophageal reflux disease without esophagitis   Other secondary hypertension   Protein-calorie malnutrition, severe   Gastrointestinal hemorrhage    DISCHARGE MEDICATIONS:   Allergies as of 04/30/2023       Reactions   Cozaar [losartan] Swelling   Angioedema    Periogard [chlorhexidine Gluconate] Swelling, Other (See Comments)   Skin irritation Eye/tongue/lip swelling   Pork Allergy Other (See Comments)   Unknown reaction   Vasotec [enalapril] Cough        Medication List     STOP taking these medications    amoxicillin-clavulanate 875-125 MG tablet Commonly known as: AUGMENTIN   doxycycline 100 MG tablet Commonly known as: ADOXA   loperamide 2 MG tablet Commonly known as: IMODIUM A-D   metoCLOPramide 5 MG tablet Commonly known as: REGLAN   ondansetron 4 MG tablet Commonly known as: ZOFRAN       TAKE these medications    acetaminophen 500 MG tablet Commonly known as: TYLENOL Place 500-1,000 mg into feeding tube See admin instructions. 2 entries on MAR: Give 1000mg  (2 tablet) via G-tube twice daily and 500 mg (1 tablet) every 6 hours as needed for pain.   amLODipine 10 MG tablet Commonly known as: NORVASC Place 1 tablet (10 mg total) into feeding tube daily.   ascorbic acid 500 MG tablet Commonly known as: VITAMIN C Place 1 tablet (500 mg total) into feeding tube daily.   atorvastatin 80 MG tablet Commonly known as: LIPITOR Place 1 tablet (80 mg total) into feeding tube daily.   carvedilol 6.25 MG tablet Commonly known as: COREG Place 1  tablet (6.25 mg total) into feeding tube 2 (two) times daily with a meal.   ezetimibe 10 MG tablet Commonly known as: ZETIA Place 1 tablet (10 mg total) into feeding tube daily.   famotidine 20 MG tablet Commonly known as: PEPCID Place 1 tablet (20 mg total) into feeding tube daily at 4 PM. What changed: when to take this   feeding supplement (GLUCERNA 1.5 CAL) Liqd Place 40 mL/hr into feeding tube daily. What changed:  how much to take how fast to infuse this   feeding supplement (PROSource TF20) liquid Place 60 mLs into feeding tube daily.   FLUoxetine 20 MG capsule Commonly known as: PROZAC Place 1 capsule (20 mg total) into feeding tube at bedtime.   free water Soln Place 30-120 mLs into feeding tube See admin instructions. Place 120 mL into feeding tube every 4 hours/6 times a day. Flush tube with 30 mL water before and after giving medications through tube, three times a day.   gabapentin 250 MG/5ML solution Commonly known as: NEURONTIN Place 2 mLs (100 mg total) into feeding tube every 8 (eight) hours.   guaiFENesin 100 MG/5ML liquid Commonly known as: ROBITUSSIN Place 100 mg into feeding tube 4 (four) times daily.   insulin lispro 100 UNIT/ML KwikPen Commonly known as: HUMALOG Inject 0-16 Units into the skin See admin instructions. Inject 0-16 units 4 times daily per sliding scale: 70-150 : 0 units 151-200 : 3 units 201-250 : 6 units 251-300 : 9 units 301-350 :  12 units 351-400 : 16 units > 401 : notify MD   ipratropium-albuterol 0.5-2.5 (3) MG/3ML Soln Commonly known as: DUONEB Take 3 mLs by nebulization every 4 (four) hours as needed.   l-methylfolate-B6-B12 3-35-2 MG Tabs tablet Commonly known as: METANX Place 1 tablet into feeding tube daily.   levETIRAcetam 100 MG/ML solution Commonly known as: Keppra Place 5 mLs (500 mg total) into feeding tube 2 (two) times daily.   lidocaine 4 % Place 1 patch onto the skin See admin instructions. Apply 1  patch to neck and 1 patch to right knee topically in the morning and remove per schedule.   metFORMIN 500 MG tablet Commonly known as: GLUCOPHAGE Place 1 tablet (500 mg total) into feeding tube 2 (two) times daily with a meal.   One Vite Ferrous Sulfate 300 MG/6.8ML Soln Generic drug: Ferrous Sulfate Give 9.27 mLs by tube daily.   OXYGEN Inhale 4 L/min into the lungs continuous.   Pantoprazole Sodium Powd Give 1 packet by tube See admin instructions. Pantoprazole DR suspension packets. Give 1 packet (40 mg) via G-tube once daily.   PreserVision AREDS 2 Chew Give 1 each by tube 2 (two) times daily.        DISPOSITION AND FOLLOW-UP:  Gina Kelley was discharged from [facility] in Stable condition. At the hospital follow up visit please address:  Follow-up Recommendations: Labs: Basic Metabolic Profile and CBC  Follow-up Appointments:  Contact information for follow-up providers     [facility]. Schedule an appointment as soon as possible for a visit.   Why: after being discharged from [name] for follow up Contact information: [address] [place] [address] [phone]             Contact information for after-discharge care     Destination     [facility] Preferred [name] .   Service: Skilled Nursing Contact information: [address] [place] [address] [phone]                     HOSPITAL COURSE:  Patient Summary: Gina Kelley is a 87 y.o. with a PMH significant for PUD, IDA, and large subdural hematoma with residual right hemiparesis on 07/2022, severe dysphagia and dysarthria who presented with a hgb of 6.4 that was noted on routine lab work.   Acute on Chronic Anemia Hx of PUD Hx of IDA Gina Kelley received a unit of pRBC and Hgb increased to 8.4. Her hemoglobin has been stable since. Her baseline hemoglobin is between 7.5-8.5 and the  cause of her acute 1 mcg/dL drop is unknown. Ferritin was decreased to <20 she received 500mg  of IV iron. GI was consulted to see what options would be available for this 87 yo with multiple co-morbidities and worsening neurological decline. An EGD was not recommended at this time. Gina Kelley should continue on pantoprazole 40mg  daily and pepcid 20mg  in the afternoon.   -Please continue patient on pantoprazole 40mg  daily -Please start pepcid suspension 20mg  every afternoon  -Please continue with ferrous sulfate 408 mg through feeds  -Monitor CBCs   Severe Malnutrition Pt has a PEG tube after her SDH 07/2022. Since then gotten extensive work-up by SLP, has severe dysphagia. Was getting pureed foods through her tube and feeds through PEG at night until she had aspiration PNA in September. After her aspiration event SLP evaluated and it was recommended that patient would remain NPO.  Another speech and swallow evaluation was done and at first she was not able to pass the bedside swallow. The day of her discharge she was able to tolerate nectar thick food products. She can have purees and nectars for pleasure at this time. An RD consult was placed, who recommended patient to be on the following:   Continue  tube feeding via PEG: Glucerna 1.5 at 40ml ml/h (960 ml per day) Prosource TF20 60 ml daily FWF at q4hrs    This Provides 1520 kcal, 99 gm protein, 1448 ml free water daily with FWF   She can additionally do purees and nectars for pleasure.  Please repeat a speech and barium swallow in 8-10 weeks. Please monitor for signs of aspiration pneumonia.  Please monitor phosphorus, potassium and magnesium with the goal of  Maintain K >4, Phosphorus >2, Mg >2. Replete as needed. She has needed her potassium supplemented since she started her diet.  History of ICH/SDH s/p craniotomy Dysphagia Has left hemiparesis, dysarthria, facial droop and dysphagia.  -Continue with PT/OT   DISCHARGE  INSTRUCTIONS:   Discharge Instructions     Call MD for:  difficulty breathing, headache or visual disturbances   Complete by: As directed    Call MD for:  extreme fatigue   Complete by: As directed    Call MD for:  hives   Complete by: As directed    Call MD for:  persistant dizziness or light-headedness   Complete by: As directed    Call MD for:  persistant nausea and vomiting   Complete by: As directed    Call MD for:  redness, tenderness, or signs of infection (pain, swelling, redness, odor or green/yellow discharge around incision site)   Complete by: As directed    Call MD for:  severe uncontrolled pain   Complete by: As directed    Call MD for:  temperature >100.4   Complete by: As directed    Diet - low sodium heart healthy   Complete by: As directed    Discharge instructions   Complete by: As directed    Gina Kelley,  Thank you for allowing [place] to take care of you. You were in the hospital because you were weak and your hemoglobin was found to be 6.4. You received a unit of blood and IV iron. Your hemoglobin levels have remained stable during your hospitalization and you are ready to go back to [name].   Please ensure Gina Kelley continues on: -pantoprazole 40mg  daily -pepcid 20mg  in the afternoon -ferrous sulfate 408 mg through feeds    Gina Kelley is severely malnourished. A nutrition consult was placed and a speech and swallow evaluation was done.   Continue  tube feeding via PEG: Glucerna 1.5 at 40ml ml/h (960 ml per day) Prosource TF20 60 ml daily Free water feeds at q4hrs   This Provides 1520 kcal, 99 gm protein, 1448 ml free water daily with FWF.   A speech and swallow evaluation was done the day of her discharge and she was able to tolerate nectar thick food products. She can have purees and nectars for pleasure at this time. Please continue to monitor her progress. She can also follow up with [place] after being discharged from the [name]. Please monitor for symptoms of aspiration.  For her diabetes, she is also on a short sliding scale 4 times days  and metformin. Please ensure she continued with the same.  She is to continue taking her other medications for her other conditions as outlined in the rest of this document.   Of note, PT/OT should be continued on this 87 yo who has been having multiple recent hospitalizations and hx of subdural hematoma.   Thank you so much.   Sincerely,  Manuela Neptune, MD  PGY-1   Increase activity slowly   Complete by: As directed        SUBJECTIVE:   Feels better and stronger today. Her stomach is not hurting as much anymore.   Discharge Vitals:   BP (!) 141/68 (BP Location: Left Arm)   Pulse 83   Temp 97.7 F (36.5 C)   Resp 16   Ht 5\' 4"  (1.626 m)   Wt 54.4 kg   SpO2 97%   BMI 20.60 kg/m   OBJECTIVE:  Physical Exam Constitutional:      General: She is not in acute distress.    Appearance: She is ill-appearing. She is not toxic-appearing.  Cardiovascular:     Rate and Rhythm: Normal rate and regular rhythm.     Heart sounds: No murmur heard.    No friction rub. No gallop.  Pulmonary:     Effort: No accessory muscle usage, respiratory distress or retractions.     Breath sounds: Rales present. No wheezing or rhonchi.  Abdominal:     General: Bowel sounds are normal.     Palpations: Abdomen is soft.     Tenderness: There is no abdominal tenderness. There is no guarding or rebound.  Musculoskeletal:     Right lower leg: No edema.     Left lower leg: No edema.     Pertinent Labs, Studies, and Procedures:     Latest Ref Rng & Units 04/29/2023    6:56 AM 04/28/2023    2:42 PM 04/28/2023    5:58 AM  CBC  WBC 4.0 - 10.5 K/uL 7.1   7.6   Hemoglobin 12.0 - 15.0 g/dL 8.2  8.7  8.4   Hematocrit 36.0 - 46.0 % 26.7  27.0  27.0   Platelets 150 - 400 K/uL 408   453        Latest Ref Rng & Units 04/30/2023    7:57 AM 04/29/2023     6:56 AM 04/28/2023    5:58 AM  CMP  Glucose 70 - 99 mg/dL 409  811  914   BUN 8 - 23 mg/dL 14  16  19    Creatinine 0.44 - 1.00 mg/dL 7.82  9.56  2.13   Sodium 135 - 145 mmol/L 134  132  135   Potassium 3.5 - 5.1 mmol/L 3.4  3.7  3.8   Chloride 98 - 111 mmol/L 98  94  98   CO2 22 - 32 mmol/L 27  27  27    Calcium 8.9 - 10.3 mg/dL 8.8  8.6  9.0     Signed: Manuela Neptune, MD Internal Medicine Resident, PGY-1 [facility]  Pager: [phone] 3:09 PM, 04/30/2023

## 2023-04-30 NOTE — Progress Notes (Signed)
Speech Language Pathology Treatment: Dysphagia  Patient Details Name: Gina Kelley MRN: [id number] DOB: 1935/11/26 Today's Date: 04/30/2023 Time: 4098-1191 SLP Time Calculation (min) (ACUTE ONLY): 27 min  Assessment / Plan / Recommendation Clinical Impression  [name] is more alert and participatory; she is showing interest in eating.  Upon review of her chart, last MBS was in Feb of this year. She has had subsequent clinical swallow assessments, and has been on and off oral diets due to concerns for aspiration and pneumonia. Today she demonstrates a pharyngeal swallow response - the quality of her swallow is unknown, so it makes sense to repeat an MBS to evaluate her physiology.  Radiology is able to fit her in this afternoon before her D/C back to [name].   Spoke with daughter, [name], at bedside and dtr Gina Kelley via phone - they agree with plan.  Sent message to MDs re: plan; they are in agreement.   HPI HPI: Patient is an 87 y.o. feamle who preseted on 10/29 from her nursing home after labs results revealed hemoglobin of 6.3. 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 PEG, anemia, chronic foley, HTN, GERD, T2DM, and HLD. Last seen by acute ST in September 2024 after aspiration event, NPO reccommended at that time.      SLP Plan  MBS      Recommendations for follow up therapy are one component of a multi-disciplinary discharge planning process, led by the attending physician.  Recommendations may be updated based on patient status, additional functional criteria and insurance authorization.    Recommendations  Diet recommendations:  (pending MBS)                  Oral care BID     Dysphagia, oropharyngeal phase (R13.12)     MBS   Rosenda Geffrard L. Samson Frederic, MA CCC/SLP Clinical Specialist - Acute Care SLP Acute Rehabilitation Services Office number [phone]   Blenda Mounts Laurice  04/30/2023, 1:06 PM

## 2023-04-30 NOTE — Progress Notes (Signed)
Report called to [place] at [facility]. All questions answered.

## 2023-05-01 DIAGNOSIS — I10 Essential (primary) hypertension: Secondary | ICD-10-CM | POA: Diagnosis not present

## 2023-05-01 DIAGNOSIS — E43 Unspecified severe protein-calorie malnutrition: Secondary | ICD-10-CM | POA: Diagnosis not present

## 2023-05-01 DIAGNOSIS — E1122 Type 2 diabetes mellitus with diabetic chronic kidney disease: Secondary | ICD-10-CM | POA: Diagnosis not present

## 2023-05-01 DIAGNOSIS — G4089 Other seizures: Secondary | ICD-10-CM | POA: Diagnosis not present

## 2023-05-01 DIAGNOSIS — D509 Iron deficiency anemia, unspecified: Secondary | ICD-10-CM | POA: Diagnosis not present

## 2023-05-04 DIAGNOSIS — D62 Acute posthemorrhagic anemia: Secondary | ICD-10-CM | POA: Diagnosis not present

## 2023-05-04 DIAGNOSIS — R2681 Unsteadiness on feet: Secondary | ICD-10-CM | POA: Diagnosis not present

## 2023-05-04 DIAGNOSIS — R1312 Dysphagia, oropharyngeal phase: Secondary | ICD-10-CM | POA: Diagnosis not present

## 2023-05-04 DIAGNOSIS — M6281 Muscle weakness (generalized): Secondary | ICD-10-CM | POA: Diagnosis not present

## 2023-05-04 DIAGNOSIS — Z515 Encounter for palliative care: Secondary | ICD-10-CM | POA: Diagnosis not present

## 2023-05-04 DIAGNOSIS — I69391 Dysphagia following cerebral infarction: Secondary | ICD-10-CM | POA: Diagnosis not present

## 2023-05-05 DIAGNOSIS — I69391 Dysphagia following cerebral infarction: Secondary | ICD-10-CM | POA: Diagnosis not present

## 2023-05-05 DIAGNOSIS — M6281 Muscle weakness (generalized): Secondary | ICD-10-CM | POA: Diagnosis not present

## 2023-05-05 DIAGNOSIS — R1312 Dysphagia, oropharyngeal phase: Secondary | ICD-10-CM | POA: Diagnosis not present

## 2023-05-05 DIAGNOSIS — R2681 Unsteadiness on feet: Secondary | ICD-10-CM | POA: Diagnosis not present

## 2023-05-05 DIAGNOSIS — D62 Acute posthemorrhagic anemia: Secondary | ICD-10-CM | POA: Diagnosis not present

## 2023-05-06 DIAGNOSIS — M6281 Muscle weakness (generalized): Secondary | ICD-10-CM | POA: Diagnosis not present

## 2023-05-06 DIAGNOSIS — D62 Acute posthemorrhagic anemia: Secondary | ICD-10-CM | POA: Diagnosis not present

## 2023-05-06 DIAGNOSIS — R2681 Unsteadiness on feet: Secondary | ICD-10-CM | POA: Diagnosis not present

## 2023-05-06 DIAGNOSIS — R1312 Dysphagia, oropharyngeal phase: Secondary | ICD-10-CM | POA: Diagnosis not present

## 2023-05-06 DIAGNOSIS — I69391 Dysphagia following cerebral infarction: Secondary | ICD-10-CM | POA: Diagnosis not present

## 2023-05-07 DIAGNOSIS — M6281 Muscle weakness (generalized): Secondary | ICD-10-CM | POA: Diagnosis not present

## 2023-05-07 DIAGNOSIS — I69391 Dysphagia following cerebral infarction: Secondary | ICD-10-CM | POA: Diagnosis not present

## 2023-05-07 DIAGNOSIS — D62 Acute posthemorrhagic anemia: Secondary | ICD-10-CM | POA: Diagnosis not present

## 2023-05-07 DIAGNOSIS — R2681 Unsteadiness on feet: Secondary | ICD-10-CM | POA: Diagnosis not present

## 2023-05-07 DIAGNOSIS — R1312 Dysphagia, oropharyngeal phase: Secondary | ICD-10-CM | POA: Diagnosis not present

## 2023-05-08 DIAGNOSIS — D62 Acute posthemorrhagic anemia: Secondary | ICD-10-CM | POA: Diagnosis not present

## 2023-05-08 DIAGNOSIS — R1312 Dysphagia, oropharyngeal phase: Secondary | ICD-10-CM | POA: Diagnosis not present

## 2023-05-08 DIAGNOSIS — I69391 Dysphagia following cerebral infarction: Secondary | ICD-10-CM | POA: Diagnosis not present

## 2023-05-08 DIAGNOSIS — R2681 Unsteadiness on feet: Secondary | ICD-10-CM | POA: Diagnosis not present

## 2023-05-08 DIAGNOSIS — M6281 Muscle weakness (generalized): Secondary | ICD-10-CM | POA: Diagnosis not present

## 2023-05-11 DIAGNOSIS — M6281 Muscle weakness (generalized): Secondary | ICD-10-CM | POA: Diagnosis not present

## 2023-05-11 DIAGNOSIS — D62 Acute posthemorrhagic anemia: Secondary | ICD-10-CM | POA: Diagnosis not present

## 2023-05-11 DIAGNOSIS — R1312 Dysphagia, oropharyngeal phase: Secondary | ICD-10-CM | POA: Diagnosis not present

## 2023-05-11 DIAGNOSIS — I69391 Dysphagia following cerebral infarction: Secondary | ICD-10-CM | POA: Diagnosis not present

## 2023-05-11 DIAGNOSIS — R2681 Unsteadiness on feet: Secondary | ICD-10-CM | POA: Diagnosis not present

## 2023-05-12 DIAGNOSIS — D62 Acute posthemorrhagic anemia: Secondary | ICD-10-CM | POA: Diagnosis not present

## 2023-05-12 DIAGNOSIS — M6281 Muscle weakness (generalized): Secondary | ICD-10-CM | POA: Diagnosis not present

## 2023-05-12 DIAGNOSIS — R1312 Dysphagia, oropharyngeal phase: Secondary | ICD-10-CM | POA: Diagnosis not present

## 2023-05-12 DIAGNOSIS — R2681 Unsteadiness on feet: Secondary | ICD-10-CM | POA: Diagnosis not present

## 2023-05-12 DIAGNOSIS — I69391 Dysphagia following cerebral infarction: Secondary | ICD-10-CM | POA: Diagnosis not present

## 2023-05-13 DIAGNOSIS — I69391 Dysphagia following cerebral infarction: Secondary | ICD-10-CM | POA: Diagnosis not present

## 2023-05-13 DIAGNOSIS — M6281 Muscle weakness (generalized): Secondary | ICD-10-CM | POA: Diagnosis not present

## 2023-05-13 DIAGNOSIS — R1312 Dysphagia, oropharyngeal phase: Secondary | ICD-10-CM | POA: Diagnosis not present

## 2023-05-13 DIAGNOSIS — D62 Acute posthemorrhagic anemia: Secondary | ICD-10-CM | POA: Diagnosis not present

## 2023-05-13 DIAGNOSIS — R2681 Unsteadiness on feet: Secondary | ICD-10-CM | POA: Diagnosis not present

## 2023-05-14 DIAGNOSIS — R1312 Dysphagia, oropharyngeal phase: Secondary | ICD-10-CM | POA: Diagnosis not present

## 2023-05-14 DIAGNOSIS — M6281 Muscle weakness (generalized): Secondary | ICD-10-CM | POA: Diagnosis not present

## 2023-05-14 DIAGNOSIS — I69391 Dysphagia following cerebral infarction: Secondary | ICD-10-CM | POA: Diagnosis not present

## 2023-05-14 DIAGNOSIS — R2681 Unsteadiness on feet: Secondary | ICD-10-CM | POA: Diagnosis not present

## 2023-05-14 DIAGNOSIS — D62 Acute posthemorrhagic anemia: Secondary | ICD-10-CM | POA: Diagnosis not present

## 2023-05-22 DIAGNOSIS — Z515 Encounter for palliative care: Secondary | ICD-10-CM | POA: Diagnosis not present

## 2023-06-08 DIAGNOSIS — I639 Cerebral infarction, unspecified: Secondary | ICD-10-CM | POA: Diagnosis not present

## 2023-06-08 DIAGNOSIS — Z931 Gastrostomy status: Secondary | ICD-10-CM | POA: Diagnosis not present

## 2023-06-08 DIAGNOSIS — G40909 Epilepsy, unspecified, not intractable, without status epilepticus: Secondary | ICD-10-CM | POA: Diagnosis not present

## 2023-06-08 DIAGNOSIS — F0150 Vascular dementia without behavioral disturbance: Secondary | ICD-10-CM | POA: Diagnosis not present

## 2023-06-08 DIAGNOSIS — N189 Chronic kidney disease, unspecified: Secondary | ICD-10-CM | POA: Diagnosis not present

## 2023-06-08 DIAGNOSIS — E119 Type 2 diabetes mellitus without complications: Secondary | ICD-10-CM | POA: Diagnosis not present

## 2023-06-08 DIAGNOSIS — I619 Nontraumatic intracerebral hemorrhage, unspecified: Secondary | ICD-10-CM | POA: Diagnosis not present

## 2023-06-11 DIAGNOSIS — R410 Disorientation, unspecified: Secondary | ICD-10-CM | POA: Diagnosis not present

## 2023-06-11 DIAGNOSIS — R451 Restlessness and agitation: Secondary | ICD-10-CM | POA: Diagnosis not present

## 2023-06-12 ENCOUNTER — Other Ambulatory Visit: Payer: Self-pay

## 2023-06-12 ENCOUNTER — Encounter (HOSPITAL_COMMUNITY): Payer: Self-pay

## 2023-06-12 ENCOUNTER — Observation Stay (HOSPITAL_COMMUNITY)
Admission: EM | Admit: 2023-06-12 | Discharge: 2023-06-14 | Disposition: A | Discharge status: Skilled Nursing Facility | Payer: Medicare HMO | Attending: Internal Medicine | Admitting: Internal Medicine

## 2023-06-12 DIAGNOSIS — R7889 Finding of other specified substances, not normally found in blood: Secondary | ICD-10-CM | POA: Diagnosis not present

## 2023-06-12 DIAGNOSIS — E1122 Type 2 diabetes mellitus with diabetic chronic kidney disease: Secondary | ICD-10-CM | POA: Diagnosis not present

## 2023-06-12 DIAGNOSIS — Z87891 Personal history of nicotine dependence: Secondary | ICD-10-CM | POA: Insufficient documentation

## 2023-06-12 DIAGNOSIS — Z794 Long term (current) use of insulin: Secondary | ICD-10-CM | POA: Diagnosis not present

## 2023-06-12 DIAGNOSIS — E785 Hyperlipidemia, unspecified: Secondary | ICD-10-CM | POA: Diagnosis not present

## 2023-06-12 DIAGNOSIS — Z7984 Long term (current) use of oral hypoglycemic drugs: Secondary | ICD-10-CM | POA: Insufficient documentation

## 2023-06-12 DIAGNOSIS — R1310 Dysphagia, unspecified: Secondary | ICD-10-CM

## 2023-06-12 DIAGNOSIS — I129 Hypertensive chronic kidney disease with stage 1 through stage 4 chronic kidney disease, or unspecified chronic kidney disease: Secondary | ICD-10-CM | POA: Insufficient documentation

## 2023-06-12 DIAGNOSIS — E876 Hypokalemia: Secondary | ICD-10-CM | POA: Insufficient documentation

## 2023-06-12 DIAGNOSIS — D500 Iron deficiency anemia secondary to blood loss (chronic): Secondary | ICD-10-CM

## 2023-06-12 DIAGNOSIS — D649 Anemia, unspecified: Principal | ICD-10-CM

## 2023-06-12 DIAGNOSIS — K922 Gastrointestinal hemorrhage, unspecified: Principal | ICD-10-CM

## 2023-06-12 DIAGNOSIS — D62 Acute posthemorrhagic anemia: Secondary | ICD-10-CM | POA: Diagnosis not present

## 2023-06-12 DIAGNOSIS — E871 Hypo-osmolality and hyponatremia: Secondary | ICD-10-CM | POA: Diagnosis not present

## 2023-06-12 DIAGNOSIS — I10 Essential (primary) hypertension: Secondary | ICD-10-CM | POA: Diagnosis present

## 2023-06-12 DIAGNOSIS — Z79899 Other long term (current) drug therapy: Secondary | ICD-10-CM | POA: Insufficient documentation

## 2023-06-12 DIAGNOSIS — R195 Other fecal abnormalities: Secondary | ICD-10-CM

## 2023-06-12 DIAGNOSIS — N181 Chronic kidney disease, stage 1: Secondary | ICD-10-CM | POA: Insufficient documentation

## 2023-06-12 DIAGNOSIS — Z8679 Personal history of other diseases of the circulatory system: Secondary | ICD-10-CM

## 2023-06-12 DIAGNOSIS — E119 Type 2 diabetes mellitus without complications: Secondary | ICD-10-CM

## 2023-06-12 LAB — COMPREHENSIVE METABOLIC PANEL
ALT: 92 U/L — ABNORMAL HIGH (ref 0–44)
AST: 129 U/L — ABNORMAL HIGH (ref 15–41)
Albumin: 2.3 g/dL — ABNORMAL LOW (ref 3.5–5.0)
Alkaline Phosphatase: 111 U/L (ref 38–126)
Anion gap: 11 (ref 5–15)
BUN: 21 mg/dL (ref 8–23)
CO2: 31 mmol/L (ref 22–32)
Calcium: 9 mg/dL (ref 8.9–10.3)
Chloride: 95 mmol/L — ABNORMAL LOW (ref 98–111)
Creatinine, Ser: 0.55 mg/dL (ref 0.44–1.00)
GFR, Estimated: >60 mL/min (ref >60)
Glucose, Bld: 104 mg/dL — ABNORMAL HIGH (ref 70–99)
Potassium: 3.9 mmol/L (ref 3.5–5.1)
Sodium: 137 mmol/L (ref 135–145)
Total Bilirubin: 0.6 mg/dL (ref <1.2)
Total Protein: 7.3 g/dL (ref 6.5–8.1)

## 2023-06-12 LAB — CBC WITH DIFFERENTIAL/PLATELET
Abs Immature Granulocytes: 0 10*3/uL (ref 0.00–0.07)
Basophils Absolute: 0 10*3/uL (ref 0.0–0.1)
Basophils Relative: 0 %
Eosinophils Absolute: 0.3 10*3/uL (ref 0.0–0.5)
Eosinophils Relative: 3 %
HCT: 27.3 % — ABNORMAL LOW (ref 36.0–46.0)
Hemoglobin: 8.1 g/dL — ABNORMAL LOW (ref 12.0–15.0)
Lymphocytes Relative: 15 %
Lymphs Abs: 1.5 10*3/uL (ref 0.7–4.0)
MCH: 28.6 pg (ref 26.0–34.0)
MCHC: 29.7 g/dL — ABNORMAL LOW (ref 30.0–36.0)
MCV: 96.5 fL (ref 80.0–100.0)
Monocytes Absolute: 0.6 10*3/uL (ref 0.1–1.0)
Monocytes Relative: 6 %
Neutro Abs: 7.4 10*3/uL (ref 1.7–7.7)
Neutrophils Relative %: 76 %
Platelets: 381 10*3/uL (ref 150–400)
RBC: 2.83 MIL/uL — ABNORMAL LOW (ref 3.87–5.11)
RDW: 22.5 % — ABNORMAL HIGH (ref 11.5–15.5)
WBC: 9.7 10*3/uL (ref 4.0–10.5)
nRBC: 0 % (ref 0.0–0.2)
nRBC: 0 /100{WBCs}

## 2023-06-12 LAB — PREPARE RBC (CROSSMATCH)

## 2023-06-12 LAB — POC OCCULT BLOOD, ED: Fecal Occult Bld: POSITIVE — AB

## 2023-06-12 MED ORDER — SODIUM CHLORIDE 0.9% IV SOLUTION: Freq: Once | INTRAVENOUS | Status: DC

## 2023-06-12 MED ORDER — PANTOPRAZOLE SODIUM 40 MG IV SOLR: 40.0000 mg | INTRAVENOUS | Status: AC

## 2023-06-12 MED ORDER — PANTOPRAZOLE SODIUM 40 MG IV SOLR
40.0000 mg | Freq: Two times a day (BID) | INTRAVENOUS | Status: DC
  Administered 2023-06-13 – 2023-06-14 (×3): 40 mg via INTRAVENOUS
  Filled 2023-06-12 (×3): qty 10

## 2023-06-12 NOTE — ED Provider Notes (Signed)
[place] EMERGENCY DEPARTMENT AT [facility] Provider Note   CSN: [id number] Arrival date & time: 06/12/23  2051     History {Add pertinent medical, surgical, social history, OB history to HPI:1} Chief Complaint  Patient presents with   Abnormal Lab   Anemia    Gina Kelley is a 87 y.o. female.  87 year old female with a history of anemia, stroke with residual left-sided weakness and difficulty speaking, G-tube dependence, peptic ulcer disease/GI bleed, hypertension, and diabetes who presents the emergency department for low hemoglobin.  Patient had routine labs that were drawn today which showed a hemoglobin of 6.9.  Last hemoglobin that they have record of was on 02/25/2023 which showed hemoglobin of 8.  Per staff at her facility, typically has dark stools because she is on iron supplementation.  No bloody stools or vaginal bleeding.  She has not had any other complaints.  Is not on blood thinners.  Was hospitalized 2 months ago and was transfused blood.  Seen by GI who felt that she would not benefit from EGD at that time.       Home Medications Prior to Admission medications   Medication Sig Start Date End Date Taking? Authorizing Provider  acetaminophen (TYLENOL) 500 MG tablet Place 500-1,000 mg into feeding tube See admin instructions. 2 entries on MAR: Give 1000mg  (2 tablet) via G-tube twice daily and 500 mg (1 tablet) every 6 hours as needed for pain.    [provider]  amLODipine (NORVASC) 10 MG tablet Place 1 tablet (10 mg total) into feeding tube daily. 08/23/22   Setzer, Lynnell Jude, PA-C  ascorbic acid (VITAMIN C) 500 MG tablet Place 1 tablet (500 mg total) into feeding tube daily. 08/23/22   Setzer, Lynnell Jude, PA-C  atorvastatin (LIPITOR) 80 MG tablet Place 1 tablet (80 mg total) into feeding tube daily. 08/23/22   Setzer, Lynnell Jude, PA-C  carvedilol (COREG) 6.25 MG tablet Place 1 tablet (6.25 mg total) into feeding tube 2 (two) times daily with a  meal. 08/22/22   Setzer, Lynnell Jude, PA-C  ezetimibe (ZETIA) 10 MG tablet Place 1 tablet (10 mg total) into feeding tube daily. 08/23/22   Setzer, Lynnell Jude, PA-C  famotidine (PEPCID) 20 MG tablet Place 1 tablet (20 mg total) into feeding tube daily at 4 PM. 04/30/23   Morene Crocker, MD  Ferrous Sulfate (ONE VITE FERROUS SULFATE) 300 MG/6.8ML SOLN Give 9.27 mLs by tube daily.    [provider]  FLUoxetine (PROZAC) 20 MG capsule Place 1 capsule (20 mg total) into feeding tube at bedtime. 08/22/22   Setzer, Lynnell Jude, PA-C  gabapentin (NEURONTIN) 250 MG/5ML solution Place 2 mLs (100 mg total) into feeding tube every 8 (eight) hours. 08/22/22   Setzer, Lynnell Jude, PA-C  guaiFENesin (ROBITUSSIN) 100 MG/5ML liquid Place 100 mg into feeding tube 4 (four) times daily.    [provider]  insulin lispro (HUMALOG) 100 UNIT/ML KwikPen Inject 0-16 Units into the skin See admin instructions. Inject 0-16 units 4 times daily per sliding scale: 70-150 : 0 units 151-200 : 3 units 201-250 : 6 units 251-300 : 9 units 301-350 : 12 units 351-400 : 16 units > 401 : notify MD    [provider]  ipratropium-albuterol (DUONEB) 0.5-2.5 (3) MG/3ML SOLN Take 3 mLs by nebulization every 4 (four) hours as needed. 02/18/23   Ghimire, Werner Lean, MD  l-methylfolate-B6-B12 (METANX) 3-35-2 MG TABS tablet Place 1 tablet into feeding tube daily. 08/23/22  Setzer, Lynnell Jude, PA-C  levETIRAcetam (KEPPRA) 100 MG/ML solution Place 5 mLs (500 mg total) into feeding tube 2 (two) times daily. 01/22/23   Micki Riley, MD  lidocaine 4 % Place 1 patch onto the skin See admin instructions. Apply 1 patch to neck and 1 patch to right knee topically in the morning and remove per schedule.    [provider]  metFORMIN (GLUCOPHAGE) 500 MG tablet Place 1 tablet (500 mg total) into feeding tube 2 (two) times daily with a meal. 08/22/22   Setzer, Lynnell Jude, PA-C  Multiple Vitamins-Minerals (PRESERVISION AREDS 2)  CHEW Give 1 each by tube 2 (two) times daily.    [provider]  Nutritional Supplements (FEEDING SUPPLEMENT, GLUCERNA 1.5 CAL,) LIQD Place 40 mL/hr into feeding tube daily. 04/30/23   Morene Crocker, MD  OXYGEN Inhale 4 L/min into the lungs continuous.    [provider]  Pantoprazole Sodium POWD Give 1 packet by tube See admin instructions. Pantoprazole DR suspension packets. Give 1 packet (40 mg) via G-tube once daily.    [provider]  Protein (FEEDING SUPPLEMENT, PROSOURCE TF20,) liquid Place 60 mLs into feeding tube daily. 04/30/23   Morene Crocker, MD  Water For Irrigation, Sterile (FREE WATER) SOLN Place 30-120 mLs into feeding tube See admin instructions. Place 120 mL into feeding tube every 4 hours/6 times a day. Flush tube with 30 mL water before and after giving medications through tube, three times a day.    [provider]      Allergies    Cozaar [losartan], Periogard [chlorhexidine gluconate], Pork allergy, and Vasotec [enalapril]    Review of Systems   Review of Systems  Physical Exam Updated Vital Signs BP 132/71 (BP Location: Right Arm)   Pulse 89   Temp 99.3 F (37.4 C)   Resp 16   SpO2 92%  Physical Exam Vitals and nursing note reviewed.  Constitutional:      General: She is not in acute distress.    Appearance: She is well-developed.  HENT:     Head: Normocephalic and atraumatic.     Right Ear: External ear normal.     Left Ear: External ear normal.     Nose: Nose normal.  Eyes:     Extraocular Movements: Extraocular movements intact.     Conjunctiva/sclera: Conjunctivae normal.     Pupils: Pupils are equal, round, and reactive to light.  Cardiovascular:     Rate and Rhythm: Normal rate and regular rhythm.     Heart sounds: No murmur heard. Pulmonary:     Effort: Pulmonary effort is normal. No respiratory distress.     Breath sounds: Normal breath sounds.  Abdominal:     General: Abdomen is flat.  There is no distension.     Palpations: Abdomen is soft. There is no mass.     Tenderness: There is no abdominal tenderness. There is no guarding.     Comments: G-tube in place  Musculoskeletal:     Cervical back: Normal range of motion and neck supple.  Skin:    General: Skin is warm and dry.  Neurological:     Mental Status: She is alert. Mental status is at baseline.     Comments: Residual left-sided weakness and difficulty speaking  Psychiatric:        Mood and Affect: Mood normal.     ED Results / Procedures / Treatments   Labs (all labs ordered are listed, but only abnormal results are  displayed) Labs Reviewed - No data to display  EKG None  Radiology No results found.  Procedures Procedures  {Document cardiac monitor, telemetry assessment procedure when appropriate:1}  Medications Ordered in ED Medications - No data to display  ED Course/ Medical Decision Making/ A&P   {   Click here for ABCD2, HEART and other calculatorsREFRESH Note before signing :1}                              Medical Decision Making Amount and/or Complexity of Data Reviewed Labs: ordered.  Risk Prescription drug management.   ***  {Document critical care time when appropriate:1} {Document review of labs and clinical decision tools ie heart score, Chads2Vasc2 etc:1}  {Document your independent review of radiology images, and any outside records:1} {Document your discussion with family members, caretakers, and with consultants:1} {Document social determinants of health affecting pt's care:1} {Document your decision making why or why not admission, treatments were needed:1} Final Clinical Impression(s) / ED Diagnoses Final diagnoses:  None    Rx / DC Orders ED Discharge Orders     None

## 2023-06-12 NOTE — ED Triage Notes (Addendum)
Pt arrives via [facility] from [facility] for abornal lab values, pt hgb drawn this morning and resulted this evening with a result of 6.9. Per pt packet from facility pt had a recent GI Bleed. Pt has chronic feeding tube, strict NPO d/t severe dysphasia/dysphagia. Pt is demented at baseline, but is able to answer most questions appropriately at this time, somewhat difficult to understand d/t deficits from a previous CVA, with left sided flaccidity present. Pt is otherwise at her baseline per facility to EMS. Pt reports c/o pain in her RLQ abdomen and R flank area at this time. Dr. Eloise Harman provided with pt paperwork from facility and present during triage. NAD noted at this time. CBG 156 for EMS.

## 2023-06-13 DIAGNOSIS — E1122 Type 2 diabetes mellitus with diabetic chronic kidney disease: Secondary | ICD-10-CM | POA: Diagnosis not present

## 2023-06-13 DIAGNOSIS — Z8679 Personal history of other diseases of the circulatory system: Secondary | ICD-10-CM | POA: Diagnosis not present

## 2023-06-13 DIAGNOSIS — R195 Other fecal abnormalities: Secondary | ICD-10-CM | POA: Diagnosis not present

## 2023-06-13 DIAGNOSIS — I10 Essential (primary) hypertension: Secondary | ICD-10-CM | POA: Diagnosis not present

## 2023-06-13 DIAGNOSIS — D62 Acute posthemorrhagic anemia: Secondary | ICD-10-CM | POA: Diagnosis not present

## 2023-06-13 DIAGNOSIS — R1310 Dysphagia, unspecified: Secondary | ICD-10-CM | POA: Diagnosis not present

## 2023-06-13 DIAGNOSIS — E119 Type 2 diabetes mellitus without complications: Secondary | ICD-10-CM

## 2023-06-13 DIAGNOSIS — N390 Urinary tract infection, site not specified: Secondary | ICD-10-CM | POA: Diagnosis not present

## 2023-06-13 DIAGNOSIS — K922 Gastrointestinal hemorrhage, unspecified: Secondary | ICD-10-CM

## 2023-06-13 DIAGNOSIS — N181 Chronic kidney disease, stage 1: Secondary | ICD-10-CM | POA: Diagnosis not present

## 2023-06-13 DIAGNOSIS — Z794 Long term (current) use of insulin: Secondary | ICD-10-CM | POA: Diagnosis not present

## 2023-06-13 LAB — CBC
HCT: 23.6 % — ABNORMAL LOW (ref 36.0–46.0)
Hemoglobin: 7.3 g/dL — ABNORMAL LOW (ref 12.0–15.0)
MCH: 29.4 pg (ref 26.0–34.0)
MCHC: 30.9 g/dL (ref 30.0–36.0)
MCV: 95.2 fL (ref 80.0–100.0)
Platelets: 344 10*3/uL (ref 150–400)
RBC: 2.48 MIL/uL — ABNORMAL LOW (ref 3.87–5.11)
RDW: 22.5 % — ABNORMAL HIGH (ref 11.5–15.5)
WBC: 10.6 10*3/uL — ABNORMAL HIGH (ref 4.0–10.5)
nRBC: 0.2 % (ref 0.0–0.2)

## 2023-06-13 LAB — COMPREHENSIVE METABOLIC PANEL
ALT: 77 U/L — ABNORMAL HIGH (ref 0–44)
AST: 94 U/L — ABNORMAL HIGH (ref 15–41)
Albumin: 2.2 g/dL — ABNORMAL LOW (ref 3.5–5.0)
Alkaline Phosphatase: 80 U/L (ref 38–126)
Anion gap: 6 (ref 5–15)
BUN: 16 mg/dL (ref 8–23)
CO2: 32 mmol/L (ref 22–32)
Calcium: 8.6 mg/dL — ABNORMAL LOW (ref 8.9–10.3)
Chloride: 97 mmol/L — ABNORMAL LOW (ref 98–111)
Creatinine, Ser: 0.51 mg/dL (ref 0.44–1.00)
GFR, Estimated: >60 mL/min (ref >60)
Glucose, Bld: 81 mg/dL (ref 70–99)
Potassium: 3.4 mmol/L — ABNORMAL LOW (ref 3.5–5.1)
Sodium: 135 mmol/L (ref 135–145)
Total Bilirubin: 0.4 mg/dL (ref <1.2)
Total Protein: 6.8 g/dL (ref 6.5–8.1)

## 2023-06-13 LAB — PREPARE RBC (CROSSMATCH)

## 2023-06-13 MED ORDER — SODIUM CHLORIDE 0.9 % IV SOLN: INTRAVENOUS | Status: DC

## 2023-06-13 MED ORDER — EZETIMIBE 10 MG PO TABS
10.0000 mg | ORAL_TABLET | Freq: Every day | ORAL | Status: DC
  Administered 2023-06-13 – 2023-06-14 (×2): 10 mg
  Filled 2023-06-13 (×2): qty 1

## 2023-06-13 MED ORDER — ATORVASTATIN CALCIUM 80 MG PO TABS
80.0000 mg | ORAL_TABLET | Freq: Every day | ORAL | Status: DC
  Administered 2023-06-13 – 2023-06-14 (×2): 80 mg
  Filled 2023-06-13 (×2): qty 1

## 2023-06-13 MED ORDER — GLUCERNA 1.5 CAL PO LIQD
1000.0000 mL | ORAL | Status: DC
  Administered 2023-06-13: 1000 mL
  Filled 2023-06-13 (×2): qty 1000

## 2023-06-13 MED ORDER — LEVETIRACETAM 100 MG/ML PO SOLN
500.0000 mg | Freq: Two times a day (BID) | ORAL | Status: DC
  Administered 2023-06-13 – 2023-06-14 (×3): 500 mg
  Filled 2023-06-13 (×3): qty 5

## 2023-06-13 MED ORDER — CARVEDILOL 6.25 MG PO TABS
6.2500 mg | ORAL_TABLET | Freq: Two times a day (BID) | ORAL | Status: DC
  Administered 2023-06-13 – 2023-06-14 (×3): 6.25 mg
  Filled 2023-06-13 (×3): qty 1

## 2023-06-13 MED ORDER — FLUOXETINE HCL 20 MG PO CAPS
20.0000 mg | ORAL_CAPSULE | Freq: Every day | ORAL | Status: DC
  Administered 2023-06-13: 20 mg
  Filled 2023-06-13: qty 1

## 2023-06-13 MED ORDER — GABAPENTIN 250 MG/5ML PO SOLN
100.0000 mg | Freq: Three times a day (TID) | ORAL | Status: DC
  Administered 2023-06-13 – 2023-06-14 (×4): 100 mg
  Filled 2023-06-13 (×6): qty 2

## 2023-06-13 MED ORDER — SODIUM CHLORIDE 0.9% IV SOLUTION: Freq: Once | INTRAVENOUS | Status: AC

## 2023-06-13 NOTE — Progress Notes (Signed)
Initial Nutrition Assessment  DOCUMENTATION CODES:   Not applicable  INTERVENTION:   Tube Feeding via PEG: continue outpatient regimen Glucerna 1.5 at 45 ml/hr TF provides 1620 kcals, 89 g of protein and 821 mL of free water  Recommend free water flush of 200 mL q 6 hours; total free water with TF plus scheduled FWF is1621 mL of free water  RD requesting new measured weight; no recent weight available  Recommend checking phosphorus and magnesium  NUTRITION DIAGNOSIS:   Inadequate oral intake related to dysphagia as evidenced by NPO status.  GOAL:   Patient will meet greater than or equal to 90% of their needs  MONITOR:   TF tolerance, Weight trends, Skin, Labs  REASON FOR ASSESSMENT:   Consult Enteral/tube feeding initiation and management  ASSESSMENT:   87 yo female admitted with acute GI Bleeding PMH includes DM, HLD, CVA, GERD, seizure disorder. Pt with hx of severe dysphagia following SDH with PEG tube placed in February.  Noted GI following but no plan to pursue endoscopy; plan conservative management with transfusions as needed. Pt with previous GI bleeds; hx of iron def anemia  NPO with PEG in place. RD confirmed via surgical report from Feb 2024 by Dr. Bedelia Person that feeding tube is as PEG/G-tube, not a G-J tube as documented   Per Med History, pt has been receiving Glucerna 1.5 at 45 ml/hr via G-tube. Per MD, plan to resume feedings tomorrow. NS at 75 ml/hr currently ordered  No recent weight. RD to request RN weigh pt. Noted MD notes referring to pt as cachetic  No skin issues, no pressure injuries per RN skin assessment   Labs: potassium 3.4 (L), no phosphorus, no magnesium, BUN/Creatinine wdl Med: reviewed  NUTRITION - FOCUSED PHYSICAL EXAM: Unable to assess  Diet Order:   Diet Order             Diet NPO time specified  Diet effective now                   EDUCATION NEEDS:   Not appropriate for education at this time  Skin:  Skin  Assessment: Reviewed RN Assessment (No pressure injuries noted)  Last BM:  12/14 type 7 large black/brown  Height:   Ht Readings from Last 1 Encounters:  04/27/23 5\' 4"  (1.626 m)    Weight:   Wt Readings from Last 1 Encounters:  04/27/23 54.4 kg     BMI:  There is no height or weight on file to calculate BMI.  Estimated Nutritional Needs:   Kcal:  1450-1650 kcals  Protein:  75-85 g  Fluid:  >/= 1.5 L   Romelle Starcher MS, RDN, LDN, CNSC Registered Dietitian 3 Clinical Nutrition RD Inpatient Contact Info in Amion

## 2023-06-13 NOTE — Plan of Care (Signed)
  Problem: Health Behavior/Discharge Planning: Goal: Ability to manage health-related needs will improve Outcome: Progressing   Problem: Clinical Measurements: Goal: Ability to maintain clinical measurements within normal limits will improve Outcome: Progressing Goal: Will remain free from infection Outcome: Progressing Goal: Respiratory complications will improve Outcome: Progressing   Problem: Safety: Goal: Ability to remain free from injury will improve Outcome: Progressing   Problem: Skin Integrity: Goal: Risk for impaired skin integrity will decrease Outcome: Progressing

## 2023-06-13 NOTE — Assessment & Plan Note (Signed)
-   Controlled.  Last hemoglobin A1c of 5.8 in August

## 2023-06-13 NOTE — ED Notes (Signed)
While providing pericare for patient, pt has significant excoriation to entire area with a few placed that are now open d/t significant moisture to pt perineum from incontinence brief. After pt care complete decision to not reapply incontinence brief to allow airflow to pt genital area made by this nurse, and only incontinence pads placed under pt at this time. Pt reports the area to be very painful and tender while cleaning performed. Provider updated for further orders if deemed necessary.

## 2023-06-13 NOTE — ED Notes (Signed)
Per ED physician orthostatic vital signs no longer necessary at this time.

## 2023-06-13 NOTE — Assessment & Plan Note (Addendum)
History of severe dysphagia following a history of subdural hematoma and had PEG tube placed in February -Continue to be n.p.o. -keep on gentle IV fluid overnight and then can resume tube feeds in the morning

## 2023-06-13 NOTE — Assessment & Plan Note (Addendum)
Patient presented to the ED due to concerns of acute anemia at [name].  Hemoglobin on outpatient lab noted to be 6.9 although repeat here is stable at 8.1.  FOBT is positive.  Has history of peptic ulcer disease iron deficiency anemia. -Had has history of GI bleed most recently in late October.  Had GI consultation but since hemoglobin remained stable and due to her poor functional status EGD was not recommended at the time. -Message has been sent to [facility] by EDP who will see in consultation tomorrow.  Unlikely to have any intervention if her hemoglobin remained stable on repeat in the morning. -Keep on PPI

## 2023-06-13 NOTE — Progress Notes (Addendum)
PROGRESS NOTE    Gina Kelley  JXB:[id number] DOB: July 22, 1935 DOA: 06/12/2023 PCP: System, Provider Not In   Chief Complaint  Patient presents with   Abnormal Lab   Anemia    Brief Narrative:   This is no charge note as patient was seen and admitted earlier today by Dr. Cherly Hensen, chart, imaging and labs were reviewed, patient was seen and examined  Assessment & Plan:   Principal Problem:   Acute GI bleeding Active Problems:   History of intracranial hemorrhage   Dysphagia   Essential hypertension   Type 2 diabetes mellitus (HCC)    Acute GI bleeding -Sent from [name] for low hemoglobin of 6.9, repeat in ED was 8.1, this morning hemoglobin of 7.3, FOBT is positive. -She is her third presentation with similar complaints, GI consult is pending -Trying to reach daughter to get consent for 1 unit PRBC transfusion -Keep on PPI  Addendum: I was able to reach daughter Fleet Contras and she gave consent for  transfusion   Dysphagia History of severe dysphagia following a history of subdural hematoma and had PEG tube placed in February -Continue to be n.p.o. -keep on gentle IV fluid overnight and then can resume tube feeds in the morning   History of intracranial hemorrhage -with residual hemiparesis, dysarthria and dysphagia -continue Keppra and Gabapentin   Essential hypertension -holding amlodipine and Coreg until the morning due to acute anemia   Type 2 diabetes mellitus (HCC) - Controlled.  Last hemoglobin A1c of 5.8 in August  DVT prophylaxis: SCD Code Status: Full code Family Communication: Tried to reach both daughters, left a voicemail Disposition:      Consultants:  Gastroenterology  Subjective:  Significant events overnight, patient unable to provide any reliable complaints  Objective: Vitals:   06/13/23 0158 06/13/23 0300 06/13/23 0827 06/13/23 1248  BP:  137/78 139/74 138/71  Pulse:  98 93 89  Resp:  18 19 19   Temp: 98.9 F (37.2 C) 98.5 F (36.9 C)  98 F (36.7 C) 98.7 F (37.1 C)  TempSrc: Oral Axillary Oral Oral  SpO2:  93%     No intake or output data in the 24 hours ending 06/13/23 1307 There were no vitals filed for this visit.  Examination:  Awake, frail, cachectic deconditioned female, laying in bed, no apparent distress alert, Oriented X 3, No new F.N deficits, Normal affect, has significant facial droop at baseline Symmetrical Chest wall movement, Good air movement bilaterally, CTAB RRR,No Gallops,Rubs or new Murmurs, No Parasternal Heave +ve B.Sounds, PEG present No Cyanosis, Clubbing or edema     Data Reviewed: I have personally reviewed following labs and imaging studies  CBC: Recent Labs  Lab 06/12/23 2203 06/13/23 0437  WBC 9.7 10.6*  NEUTROABS 7.4  --   HGB 8.1* 7.3*  HCT 27.3* 23.6*  MCV 96.5 95.2  PLT 381 344    Basic Metabolic Panel: Recent Labs  Lab 06/12/23 2203 06/13/23 0437  NA 137 135  K 3.9 3.4*  CL 95* 97*  CO2 31 32  GLUCOSE 104* 81  BUN 21 16  CREATININE 0.55 0.51  CALCIUM 9.0 8.6*    GFR: CrCl cannot be calculated (Unknown ideal weight.).  Liver Function Tests: Recent Labs  Lab 06/12/23 2203 06/13/23 0437  AST 129* 94*  ALT 92* 77*  ALKPHOS 111 80  BILITOT 0.6 0.4  PROT 7.3 6.8  ALBUMIN 2.3* 2.2*    CBG: No results for input(s): "GLUCAP" in the last 168 hours.  No results found for this or any previous visit (from the past 240 hours).       Radiology Studies: No results found.      Scheduled Meds:  sodium chloride   Intravenous Once   atorvastatin  80 mg Per Tube Daily   carvedilol  6.25 mg Per Tube BID WC   ezetimibe  10 mg Per Tube Daily   FLUoxetine  20 mg Per Tube QHS   gabapentin  100 mg Per Tube Q8H   levETIRAcetam  500 mg Per Tube BID   pantoprazole (PROTONIX) IV  40 mg Intravenous Q12H   Continuous Infusions:  sodium chloride 75 mL/hr at 06/13/23 1219     LOS: 0 days      Huey Bienenstock, MD [facility]   To  contact the attending provider between 7A-7P or the covering provider during after hours 7P-7A, please log into the web site [email or web address] and access using universal [place] password for that web site. If you do not have the password, please call the hospital operator.  06/13/2023, 1:07 PM

## 2023-06-13 NOTE — Assessment & Plan Note (Signed)
-  holding amlodipine and Coreg until the morning due to acute anemia

## 2023-06-13 NOTE — ED Notes (Signed)
ED TO INPATIENT HANDOFF REPORT  ED Nurse Name and Phone #: alex [phone]  S Name/Age/Gender Gina Kelley 87 y.o. female Room/Bed: 003C/003C  Code Status   Code Status: Full Code  Home/[name]/Other Skilled nursing facility Patient oriented to: self and place Is this baseline? Yes   Triage Complete: Triage complete  Chief Complaint Acute blood loss anemia [D62]  Triage Note Pt arrives via [facility] from [facility] for abornal lab values, pt hgb drawn this morning and resulted this evening with a result of 6.9. Per pt packet from facility pt had a recent GI Bleed. Pt has chronic feeding tube, strict NPO d/t severe dysphasia/dysphagia. Pt is demented at baseline, but is able to answer most questions appropriately at this time, somewhat difficult to understand d/t deficits from a previous CVA, with left sided flaccidity present. Pt is otherwise at her baseline per facility to EMS. Pt reports c/o pain in her RLQ abdomen and R flank area at this time. Dr. Eloise Harman provided with pt paperwork from facility and present during triage. NAD noted at this time. CBG 156 for EMS.    Allergies Allergies  Allergen Reactions   Cozaar [Losartan] Swelling    Angioedema    Periogard [Chlorhexidine Gluconate] Swelling and Other (See Comments)    Skin irritation Eye/tongue/lip swelling   Pork Allergy Other (See Comments)    Unknown reaction   Vasotec [Enalapril] Cough    Level of Care/Admitting Diagnosis ED Disposition     ED Disposition  Admit   Condition  --   Comment  Hospital Area: [facility] [[id number]]  Level of Care: Telemetry Medical [104]  May place patient in observation at [facility] or [facility] if equivalent level of care is available:: No  Covid Evaluation: Asymptomatic - no recent exposure (last 10 days) testing not required  Diagnosis: Acute blood loss anemia [829562]  Admitting Physician: Anselm Jungling [[id number]]  Attending Physician: Anselm Jungling  [[id number]]          B Medical/Surgery History Past Medical History:  Diagnosis Date   Anemia of chronic disease 07/08/2006   Antral ulcer 02/23/2007   Seen on EGD in 2008, small ulcer with erosion    Cerebral vascular accident (HCC) 04/14/2006   1998, left lower extremity numbness, no residual deficits    Essential hypertension 04/14/2006   Gastroesophageal reflux disease 02/18/2013   Occasional, symptomatically relieved with peptobismol    Healthcare maintenance 12/04/2011   Hyperlipidemia LDL goal < 100 04/14/2006   Hypertensive retinopathy of both eyes, grade 1 05/15/2016   Osteopenia of right femoral neck 10/20/2016   DEXA (10/16/2016): R femur T -2.5 (FRAX tool calculates at -2.4), L1-L4 spine T -0.9, 10 year risk for: Major osteoporotic fracture 8.3%, Hip fracture 2.7%   Seizure disorder (HCC) 03/01/2023   Type 2 diabetes mellitus with stage 1 chronic kidney disease, without long-term current use of insulin (HCC)    Type II diabetes mellitus (HCC) 04/14/2006   Past Surgical History:  Procedure Laterality Date   COLONOSCOPY     CRANIOTOMY Right 07/08/2022   Procedure: RIGHT CRANIOTOMY HEMATOMA EVACUATION SUBDURAL;  Surgeon: Julio Sicks, MD;  Location: [facility] OR;  Service: Neurosurgery;  Laterality: Right;   ESOPHAGOGASTRODUODENOSCOPY     ESOPHAGOGASTRODUODENOSCOPY (EGD) WITH PROPOFOL N/A 08/01/2022   Procedure: ESOPHAGOGASTRODUODENOSCOPY (EGD) WITH PROPOFOL;  Surgeon: Diamantina Monks, MD;  Location: [facility] ENDOSCOPY;  Service: General;  Laterality: N/A;   PEG PLACEMENT N/A 08/01/2022   Procedure: PERCUTANEOUS ENDOSCOPIC GASTROSTOMY (PEG)  PLACEMENT;  Surgeon: Diamantina Monks, MD;  Location: [facility] ENDOSCOPY;  Service: General;  Laterality: N/A;     A IV Location/Drains/Wounds Patient Lines/Drains/Airways Status     Active Line/Drains/Airways     Name Placement date Placement time Site Days   Peripheral IV 06/12/23 20 G Anterior;Left Forearm 06/12/23  2156  Forearm  1    Gastrostomy/Enterostomy PEG-jejunostomy 24 Fr. LLQ 08/01/22  1044  LLQ  316            Intake/Output Last 24 hours No intake or output data in the 24 hours ending 06/13/23 0130  Labs/Imaging Results for orders placed or performed during the hospital encounter of 06/12/23 (from the past 48 hours)  Type and screen [facility]     Status: None (Preliminary result)   Collection Time: 06/12/23 10:01 PM  Result Value Ref Range   ABO/RH(D) O POS    Antibody Screen NEG    Sample Expiration      06/15/2023,2359 Performed at [facility], [address] [place], [place] [address]    Unit Number [id number]    Blood Component Type RED CELLS,LR    Unit division 00    Status of Unit ALLOCATED    Transfusion Status OK TO TRANSFUSE    Crossmatch Result Compatible   Comprehensive metabolic panel     Status: Abnormal   Collection Time: 06/12/23 10:03 PM  Result Value Ref Range   Sodium 137 135 - 145 mmol/L   Potassium 3.9 3.5 - 5.1 mmol/L   Chloride 95 (L) 98 - 111 mmol/L   CO2 31 22 - 32 mmol/L   Glucose, Bld 104 (H) 70 - 99 mg/dL    Comment: Glucose reference range applies only to samples taken after fasting for at least 8 hours.   BUN 21 8 - 23 mg/dL   Creatinine, Ser 7.82 0.44 - 1.00 mg/dL   Calcium 9.0 8.9 - 95.6 mg/dL   Total Protein 7.3 6.5 - 8.1 g/dL   Albumin 2.3 (L) 3.5 - 5.0 g/dL   AST 213 (H) 15 - 41 U/L   ALT 92 (H) 0 - 44 U/L   Alkaline Phosphatase 111 38 - 126 U/L   Total Bilirubin 0.6 <1.2 mg/dL   GFR, Estimated >08 >65 mL/min    Comment: (NOTE) Calculated using the CKD-EPI Creatinine Equation (2021)    Anion gap 11 5 - 15    Comment: Performed at [facility], [address], [place], [place] [address]  CBC with Differential     Status: Abnormal   Collection Time: 06/12/23 10:03 PM  Result Value Ref Range   WBC 9.7 4.0 - 10.5 K/uL   RBC 2.83 (L) 3.87 - 5.11 MIL/uL   Hemoglobin 8.1 (L) 12.0 - 15.0 g/dL   HCT 62.9 (L) 52.8 - 41.3  %   MCV 96.5 80.0 - 100.0 fL   MCH 28.6 26.0 - 34.0 pg   MCHC 29.7 (L) 30.0 - 36.0 g/dL   RDW 24.4 (H) 01.0 - 27.2 %   Platelets 381 150 - 400 K/uL   nRBC 0.0 0.0 - 0.2 %   Neutrophils Relative % 76 %   Neutro Abs 7.4 1.7 - 7.7 K/uL   Lymphocytes Relative 15 %   Lymphs Abs 1.5 0.7 - 4.0 K/uL   Monocytes Relative 6 %   Monocytes Absolute 0.6 0.1 - 1.0 K/uL   Eosinophils Relative 3 %   Eosinophils Absolute 0.3 0.0 - 0.5  K/uL   Basophils Relative 0 %   Basophils Absolute 0.0 0.0 - 0.1 K/uL   nRBC 0 0 /100 WBC   Abs Immature Granulocytes 0.00 0.00 - 0.07 K/uL   Polychromasia PRESENT     Comment: Performed at [facility], [address] [place], [place] [address]  Prepare RBC (crossmatch)     Status: None   Collection Time: 06/12/23 10:03 PM  Result Value Ref Range   Order Confirmation      ORDER PROCESSED BY BLOOD BANK Performed at [facility], [address], [place], [place] [address]   POC occult blood, ED     Status: Abnormal   Collection Time: 06/12/23 10:18 PM  Result Value Ref Range   Fecal Occult Bld POSITIVE (A) NEGATIVE   No results found.  Pending Labs Unresulted Labs (From admission, onward)     Start     Ordered   06/13/23 0500  CBC  Tomorrow morning,   R        06/13/23 0119   06/13/23 0500  Comprehensive metabolic panel  Tomorrow morning,   R        06/13/23 0119            Vitals/Pain Today's Vitals   06/12/23 2230 06/12/23 2300 06/12/23 2330 06/13/23 0000  BP: 136/68 125/69 128/77 135/82  Pulse: 91 92 95 100  Resp: (!) 21 (!) 22 (!) 27 17  Temp:      SpO2: 94% 94% 96% 94%    Isolation Precautions No active isolations  Medications Medications  pantoprazole (PROTONIX) injection 40 mg (40 mg Intravenous Not Given 06/13/23 0023)    Followed by  pantoprazole (PROTONIX) injection 40 mg (has no administration in time range)  0.9 %  sodium chloride infusion (Manually program via Guardrails IV Fluids) (0 mLs Intravenous Hold  06/13/23 0024)  ezetimibe (ZETIA) tablet 10 mg (has no administration in time range)  atorvastatin (LIPITOR) tablet 80 mg (has no administration in time range)  FLUoxetine (PROZAC) capsule 20 mg (has no administration in time range)  levETIRAcetam (KEPPRA) 100 MG/ML solution 500 mg (has no administration in time range)  gabapentin (NEURONTIN) 250 MG/5ML solution 100 mg (has no administration in time range)  carvedilol (COREG) tablet 6.25 mg (has no administration in time range)  0.9 %  sodium chloride infusion (has no administration in time range)    Mobility non-ambulatory     Focused Assessments Neuro Assessment Handoff:  Swallow screen pass? No  Cardiac Rhythm: Normal sinus rhythm       Neuro Assessment:   Neuro Checks:      Has TPA been given? No If patient is a Neuro Trauma and patient is going to OR before floor call report to 4N Charge nurse: [phone] or [phone]   R Recommendations: See Admitting Provider Note  Report given to:   Additional Notes: Left sided residual deficits. PEG tube. Strict NPO aspiration precautions. Total care.

## 2023-06-13 NOTE — Assessment & Plan Note (Addendum)
-  with residual hemiparesis, dysarthria and dysphagia -continue Keppra and Gabapentin

## 2023-06-13 NOTE — Consult Note (Addendum)
Attending physician's note   I have taken a history, reviewed the chart, and examined the patient. I performed a substantive portion of this encounter, including complete performance of at least one of the key components, in conjunction with the APP. I agree with the APP's note, impression, and recommendations with my edits.  87 year old female with medical history as outlined below, familiar to me from previous hospitalization in 03/2023 with anemia and intermittently heme positive stool, again admitted with anemia.  Admission H/H 8.1/27.3, reduced to 7.3/23.6 today.  No overt bleeding.  Baseline ~8-10 this year and has been ~10 chronically for years.  I discussed her case at length with her daughter and Arlana Pouch, Gwenette Greet, by phone today.  [name] tells me that she had a hemoglobin in the 6's at [facility] and was told to go to the ER.  I cannot see that lab anywhere, but was 8.1 on arrival.  I discussed the possibility of acute, recurrent or smoldering GI bleed as source for her ongoing/recurrent anemia.  Discussed the role/utility of endoscopy for diagnostic and therapeutic intent.  Would initially start with upper endoscopy based on clinical presentation.  She states she has discussed this issue with her other family members and after careful consideration of the pros/cons, they again do not want to proceed with any endoscopic procedures for their mother.  They would like instead to proceed with conservative measures, medical management, and blood products as needed.  This decision is in line with the family's previous conversations with the inpatient GI team during her September and October hospital admissions for similar concerns.  - Continue serial CBC checks with blood products as needed per protocol - Recommend continued CBC checks as outpatient with periodic blood transfusions as needed - Based on now multiple conversations with the patient's family, no plan to pursue endoscopy.  If the  patient does require repeat hospital admission, again recommend conservative management with transfusions as needed - Continue Protonix 40 mg daily indefinitely - Inpatient GI service will sign off at this time.  [address], DO, [place] [phone] office          Consultation  Referring Provider: Dr. Randol Kern    Primary Care Physician:  System, Provider Not In Primary Gastroenterologist: Dr. Leone Payor       Reason for Consultation: Acute on chronic anemia with Hemoccult positive stools            HPI:   Gina Kelley is a 87 y.o. female with a past medical history as listed below including ICH/SDH status postcraniotomy 1/24 with resultant left-sided hemiparesis/dysphagia requiring PEG tube and [name] placement, seizure disorder, bladder outlet obstruction with chronic indwelling Foley catheter and GERD/PUD in 2008, who returned to the hospital for a decreasing hemoglobin overnight.     04/28/2023-04/29/2023 patient followed by our service for anemia with heme positive stool.  At that time noted she had been seen in September with similar issues and she was not felt appropriate for endoscopic intervention, also did not have any evidence of active GI bleeding.  At that time discussed with family that endoscopy would only be for emergent last resort type measures if she were to have a large bleed.  Per notes at that time even then they were reluctant to proceed.  At last admission hemoglobin 6.4--> 1 unit PRBCs--> 8.4.  Hemoccult negative.    Today, history is garnered from the hospitalist service as well as previous notes as patient is unable to answer  questioning with her dysarthria.  They describe hospitalization above, at that time we did not pursue endoscopy due to poor baseline function and stable hemoglobin, this time though hemoglobin has dropped again and her stool is Hemoccult positive.  Apparently always dark given iron supplementation.  Patient is unable to sign her own consent  and family was called.  I did talk to her son who tells me that if we think an EGD would be beneficial for her then he is okay with pursuing though he is not the POA.  He recommended I talked to [facility] her daughter who did not answer the phone.      GI history: Colonoscopy and EGD in 2008 with Dr. Leone Payor: Small antral erosion and small hiatal hernia on EGD, colonoscopy completely negative other than internal hemorrhoids  Past Medical History:  Diagnosis Date   Anemia of chronic disease 07/08/2006   Antral ulcer 02/23/2007   Seen on EGD in 2008, small ulcer with erosion    Cerebral vascular accident (HCC) 04/14/2006   1998, left lower extremity numbness, no residual deficits    Essential hypertension 04/14/2006   Gastroesophageal reflux disease 02/18/2013   Occasional, symptomatically relieved with peptobismol    Healthcare maintenance 12/04/2011   Hyperlipidemia LDL goal < 100 04/14/2006   Hypertensive retinopathy of both eyes, grade 1 05/15/2016   Osteopenia of right femoral neck 10/20/2016   DEXA (10/16/2016): R femur T -2.5 (FRAX tool calculates at -2.4), L1-L4 spine T -0.9, 10 year risk for: Major osteoporotic fracture 8.3%, Hip fracture 2.7%   Seizure disorder (HCC) 03/01/2023   Type 2 diabetes mellitus with stage 1 chronic kidney disease, without long-term current use of insulin (HCC)    Type II diabetes mellitus (HCC) 04/14/2006    Past Surgical History:  Procedure Laterality Date   COLONOSCOPY     CRANIOTOMY Right 07/08/2022   Procedure: RIGHT CRANIOTOMY HEMATOMA EVACUATION SUBDURAL;  Surgeon: Julio Sicks, MD;  Location: [facility] OR;  Service: Neurosurgery;  Laterality: Right;   ESOPHAGOGASTRODUODENOSCOPY     ESOPHAGOGASTRODUODENOSCOPY (EGD) WITH PROPOFOL N/A 08/01/2022   Procedure: ESOPHAGOGASTRODUODENOSCOPY (EGD) WITH PROPOFOL;  Surgeon: Diamantina Monks, MD;  Location: [facility] ENDOSCOPY;  Service: General;  Laterality: N/A;   PEG PLACEMENT N/A 08/01/2022   Procedure: PERCUTANEOUS ENDOSCOPIC  GASTROSTOMY (PEG) PLACEMENT;  Surgeon: Diamantina Monks, MD;  Location: [facility] ENDOSCOPY;  Service: General;  Laterality: N/A;    Family History  Problem Relation Age of Onset   Stroke Mother    Diabetes Mother    Hypertension Mother    Anuerysm Father 71       Cerebral   Asthma Sister    Breast cancer Sister    Pulmonary disease Brother        Black lung   Stroke Sister    Cirrhosis Brother    Hypertension Son    Asthma Brother     Social History   Tobacco Use   Smoking status: Former    Current packs/day: 0.00    Types: Cigarettes    Quit date: 06/30/1978    Years since quitting: 44.9   Smokeless tobacco: Never  Vaping Use   Vaping status: Never Used  Substance Use Topics   Alcohol use: No   Drug use: No    Prior to Admission medications   Medication Sig Start Date End Date Taking? Authorizing Provider  acetaminophen (TYLENOL) 500 MG tablet Place 1,000 mg into feeding tube in the morning and at bedtime.   Yes [provider]  acetaminophen (TYLENOL) 500 MG tablet Place 500 mg into feeding tube every 6 (six) hours as needed for mild pain (pain score 1-3).   Yes [provider]  Amino Acids-Protein Hydrolys (PRO-STAT) LIQD Place 60 mLs into feeding tube daily.   Yes [provider]  amLODipine (NORVASC) 10 MG tablet Place 1 tablet (10 mg total) into feeding tube daily. 08/23/22  Yes Setzer, Lynnell Jude, PA-C  ascorbic acid (VITAMIN C) 500 MG tablet Place 1 tablet (500 mg total) into feeding tube daily. 08/23/22  Yes Setzer, Lynnell Jude, PA-C  atorvastatin (LIPITOR) 80 MG tablet Place 1 tablet (80 mg total) into feeding tube daily. 08/23/22  Yes Setzer, Lynnell Jude, PA-C  carvedilol (COREG) 6.25 MG tablet Place 1 tablet (6.25 mg total) into feeding tube 2 (two) times daily with a meal. 08/22/22  Yes Setzer, Lynnell Jude, PA-C  famotidine (PEPCID) 20 MG tablet Place 1 tablet (20 mg total) into feeding tube daily at 4 PM. 04/30/23  Yes Morene Crocker, MD   Ferrous Sulfate (ONE VITE FERROUS SULFATE) 300 MG/6.8ML SOLN Give 9.27 mLs by tube daily.   Yes [provider]  gabapentin (NEURONTIN) 250 MG/5ML solution Place 2 mLs (100 mg total) into feeding tube every 8 (eight) hours. 08/22/22  Yes Setzer, Lynnell Jude, PA-C  guaiFENesin (ROBITUSSIN) 100 MG/5ML liquid Place 100 mg into feeding tube 4 (four) times daily.   Yes [provider]  insulin lispro (HUMALOG) 100 UNIT/ML KwikPen Inject 0-16 Units into the skin See admin instructions. Inject 0-16 units 4 times daily per sliding scale: 70-150 : 0 units 151-200 : 3 units 201-250 : 6 units 251-300 : 9 units 301-350 : 12 units 351-400 : 16 units > 401 : notify MD   Yes [provider]  ipratropium-albuterol (DUONEB) 0.5-2.5 (3) MG/3ML SOLN Take 3 mLs by nebulization every 4 (four) hours as needed. 02/18/23  Yes Ghimire, Werner Lean, MD  l-methylfolate-B6-B12 (METANX) 3-35-2 MG TABS tablet Place 1 tablet into feeding tube daily. 08/23/22  Yes Setzer, Lynnell Jude, PA-C  levETIRAcetam (KEPPRA) 100 MG/ML solution Place 5 mLs (500 mg total) into feeding tube 2 (two) times daily. 01/22/23  Yes Micki Riley, MD  lidocaine (LIDODERM) 5 % Place 1 patch onto the skin See admin instructions. Apply 1 patch to right knee and neck; utilized 12hrs on/off schedule   Yes [provider]  metFORMIN (GLUCOPHAGE) 500 MG tablet Place 1 tablet (500 mg total) into feeding tube 2 (two) times daily with a meal. 08/22/22  Yes Setzer, Lynnell Jude, PA-C  metoCLOPramide (REGLAN) 5 MG tablet Take 5 mg by mouth 3 (three) times daily. 06/08/23  Yes [provider]  Multiple Vitamins-Minerals (PRESERVISION AREDS 2) CHEW Give 1 each by tube 2 (two) times daily.   Yes [provider]  Nutritional Supplements (FEEDING SUPPLEMENT, GLUCERNA 1.5 CAL,) LIQD Place 40 mL/hr into feeding tube daily. Patient taking differently: Place 45 mL/hr into feeding tube 3 (three) times daily. Place 45ml into feeding  tube continuouslt 04/30/23  Yes Morene Crocker, MD  Pantoprazole Sodium POWD 1 packet by Gastric Tube route daily.   Yes [provider]  Water For Irrigation, Sterile (FREE WATER) SOLN Place 120 mLs into feeding tube every 4 (four) hours.   Yes [provider]  Water For Irrigation, Sterile (STERILE WATER FOR IRRIGATION IR) Place 30 mLs into feeding tube in the morning, at noon, and at bedtime. Flush tube with 30ml of water before and after giving medications  Yes [provider]  ezetimibe (ZETIA) 10 MG tablet Place 1 tablet (10 mg total) into feeding tube daily. Patient taking differently: Place 2,100 mg into feeding tube at bedtime. 08/23/22   Setzer, Lynnell Jude, PA-C  FLUoxetine (PROZAC) 20 MG capsule Place 1 capsule (20 mg total) into feeding tube at bedtime. 08/22/22   Setzer, Lynnell Jude, PA-C  OXYGEN Inhale 4 L/min into the lungs continuous.    [provider]  Protein (FEEDING SUPPLEMENT, PROSOURCE TF20,) liquid Place 60 mLs into feeding tube daily. Patient not taking: Reported on 06/13/2023 04/30/23   Morene Crocker, MD    Current Facility-Administered Medications  Medication Dose Route Frequency Provider Last Rate Last Admin   0.9 %  sodium chloride infusion (Manually program via Guardrails IV Fluids)   Intravenous Once Rondel Baton, MD   Held at 06/13/23 0024   0.9 %  sodium chloride infusion   Intravenous Continuous Benita Gutter T, DO 75 mL/hr at 06/13/23 0259 New Bag at 06/13/23 0259   atorvastatin (LIPITOR) tablet 80 mg  80 mg Per Tube Daily Tu, Ching T, DO   80 mg at 06/13/23 0917   carvedilol (COREG) tablet 6.25 mg  6.25 mg Per Tube BID WC Tu, Ching T, DO   6.25 mg at 06/13/23 1610   ezetimibe (ZETIA) tablet 10 mg  10 mg Per Tube Daily Tu, Ching T, DO   10 mg at 06/13/23 9604   FLUoxetine (PROZAC) capsule 20 mg  20 mg Per Tube QHS Tu, Ching T, DO       gabapentin (NEURONTIN) 250 MG/5ML solution 100 mg  100 mg Per Tube Q8H Tu,  Ching T, DO       levETIRAcetam (KEPPRA) 100 MG/ML solution 500 mg  500 mg Per Tube BID Tu, Ching T, DO   500 mg at 06/13/23 5409   pantoprazole (PROTONIX) injection 40 mg  40 mg Intravenous Q12H Rondel Baton, MD   40 mg at 06/13/23 8119    Allergies as of 06/12/2023 - Review Complete 06/12/2023  Allergen Reaction Noted   Cozaar [losartan] Swelling 08/05/2022   Periogard [chlorhexidine gluconate] Swelling and Other (See Comments)    Pork allergy Other (See Comments) 02/13/2023   Vasotec [enalapril] Cough 09/02/2013     Review of Systems:    Unable to complete due to functional status/ dysarthria   Physical Exam:  Vital signs in last 24 hours: Temp:  [98 F (36.7 C)-99.3 F (37.4 C)] 98 F (36.7 C) (12/14 0827) Pulse Rate:  [89-100] 93 (12/14 0827) Resp:  [16-27] 19 (12/14 0827) BP: (125-140)/(66-82) 139/74 (12/14 0827) SpO2:  [92 %-100 %] 93 % (12/14 0300) Last BM Date : 06/13/23 General:   Pleasant chronically ill-appearing African-American female appears to be in NAD, Well developed, cachectic, alert  Head:  Normocephalic and atraumatic. Eyes:   PEERL, EOMI. No icterus. Conjunctiva pink. Ears:  Normal auditory acuity. Neck:  Supple Throat: Oral cavity and pharynx without inflammation, swelling or lesion. Teeth in good condition. Lungs: Respirations even and unlabored. Lungs clear to auscultation bilaterally.   No wheezes, crackles, or rhonchi.  Heart: Normal S1, S2. No MRG. Regular rate and rhythm. No peripheral edema, cyanosis or pallor.  Abdomen:  Soft, nondistended, nontender. No rebound or guarding. Normal bowel sounds. No appreciable masses or hepatomegaly. + PEG tube in place Rectal:  Not performed.  Msk:  Symmetrical without gross deformities. Peripheral pulses intact.  Extremities:  Without edema, no deformity or joint abnormality. Normal ROM, normal  sensation. Neurologic:  Alert ; attempts to answer questions but difficult to understand due to  dysarthria Skin:   Dry and intact without significant lesions or rashes. Psychiatric: Demonstrates good judgement and reason without abnormal affect or behaviors.   LAB RESULTS: Recent Labs    06/12/23 2203 06/13/23 0437  WBC 9.7 10.6*  HGB 8.1* 7.3*  HCT 27.3* 23.6*  PLT 381 344   BMET Recent Labs    06/12/23 2203 06/13/23 0437  NA 137 135  K 3.9 3.4*  CL 95* 97*  CO2 31 32  GLUCOSE 104* 81  BUN 21 16  CREATININE 0.55 0.51  CALCIUM 9.0 8.6*   LFT Recent Labs    06/13/23 0437  PROT 6.8  ALBUMIN 2.2*  AST 94*  ALT 77*  ALKPHOS 80  BILITOT 0.4    Impression / Plan:   Impression: 1.  Acute on chronic anemia: [facility]ed from the [name] with outpatient labs noted to have a hemoglobin of 6.9--> repeat in hospital 8.1, FOBT positive this time, distant history of peptic ulcer disease, previously valid by our service in October and family only agreed to procedures if they were emergent, again no overt signs of GI bleeding other than this Hemoccult positive stool, unable to get in touch with family at time of my interview with the patient 2.  Dysphagia: Ongoing which resulted in PEG tube placement in February, tube feeds have been held overnight just in case of procedures 3.  History of intracranial hemorrhage 4.  Type 2 diabetes  Plan: 1.  At this time can consider an EGD given Hemoccult positive stool and repeat admissions, but again patient is high risk for these procedures.  I did have a discussion with her eldest son who tells me if she needs them then he trusts our opinion, but he is not her POA.  I tried to get in touch with [name] her daughter who has not answered the phone.  I left her my number to call so that we can discuss this further. 2.  For now would continue to hold PEG tube feeds until we get an answer about EGD which possibly could be added on today if family is willing. 3.  Continue to monitor hemoglobin with transfusion as needed less than 7 4.  Agree with  Pantoprazole 40 mg IV twice daily  Thank you for your kind consultation, we will continue to follow.  Violet Baldy [facility]  06/13/2023, 10:44 AM

## 2023-06-13 NOTE — H&P (Addendum)
History and Physical    Patient: Gina Kelley NWG:[id number] DOB: [birth year] DOA: 06/12/2023 DOS: the patient was seen and examined on 06/13/2023 PCP: System, Provider Not In  Patient coming from: [name]  Chief Complaint:  Chief Complaint  Patient presents with   Abnormal Lab   Anemia   HPI: Gina Kelley is a 87 y.o. female with medical history significant of PUD, iron deficiency anemia, subdural hematoma with residual right hemiparesis and dysarthria, severe dysphagia with PEG dependency, hx of GI bleed presents with acute anemia.   She was last hospitalized for acute anemia at the end of October. GI was consulted and did not recommend endoscopy due to poor baseline function and stable Hgb.  She had blood work drawn at [facility] today with resulting Hgb of 6.9 and was sent to ED. Stool is always dark due to iron supplementation.  No abdominal pain.   Repeat Hgb in ED at 8.1 with positive FOBT. Her base hgb is at 7.  Cr is wnl at 0.55. There is elevation in AST and ALT but otherwise total bilirubin alkaline phosphatase within normal limits.  She was otherwise afebrile, normotensive on room air.  Hospitalist was consulted for GI bleed.  Message was sent to [facility] for consultation in the morning  Review of Systems: As mentioned in the history of present illness. All other systems reviewed and are negative. Past Medical History:  Diagnosis Date   Anemia of chronic disease 07/08/2006   Antral ulcer 02/23/2007   Seen on EGD in 2008, small ulcer with erosion    Cerebral vascular accident (HCC) 04/14/2006   1998, left lower extremity numbness, no residual deficits    Essential hypertension 04/14/2006   Gastroesophageal reflux disease 02/18/2013   Occasional, symptomatically relieved with peptobismol    Healthcare maintenance 12/04/2011   Hyperlipidemia LDL goal < 100 04/14/2006   Hypertensive retinopathy of both eyes, grade 1 05/15/2016   Osteopenia of right femoral neck  10/20/2016   DEXA (10/16/2016): R femur T -2.5 (FRAX tool calculates at -2.4), L1-L4 spine T -0.9, 10 year risk for: Major osteoporotic fracture 8.3%, Hip fracture 2.7%   Seizure disorder (HCC) 03/01/2023   Type 2 diabetes mellitus with stage 1 chronic kidney disease, without long-term current use of insulin (HCC)    Type II diabetes mellitus (HCC) 04/14/2006   Past Surgical History:  Procedure Laterality Date   COLONOSCOPY     CRANIOTOMY Right 07/08/2022   Procedure: RIGHT CRANIOTOMY HEMATOMA EVACUATION SUBDURAL;  Surgeon: Julio Sicks, MD;  Location: [facility] OR;  Service: Neurosurgery;  Laterality: Right;   ESOPHAGOGASTRODUODENOSCOPY     ESOPHAGOGASTRODUODENOSCOPY (EGD) WITH PROPOFOL N/A 08/01/2022   Procedure: ESOPHAGOGASTRODUODENOSCOPY (EGD) WITH PROPOFOL;  Surgeon: Diamantina Monks, MD;  Location: [facility] ENDOSCOPY;  Service: General;  Laterality: N/A;   PEG PLACEMENT N/A 08/01/2022   Procedure: PERCUTANEOUS ENDOSCOPIC GASTROSTOMY (PEG) PLACEMENT;  Surgeon: Diamantina Monks, MD;  Location: [facility] ENDOSCOPY;  Service: General;  Laterality: N/A;   Social History:  reports that she quit smoking about 44 years ago. Her smoking use included cigarettes. She has never used smokeless tobacco. She reports that she does not drink alcohol and does not use drugs.  Allergies  Allergen Reactions   Cozaar [Losartan] Swelling    Angioedema    Periogard [Chlorhexidine Gluconate] Swelling and Other (See Comments)    Skin irritation Eye/tongue/lip swelling   Pork Allergy Other (See Comments)    Unknown reaction   Vasotec [Enalapril] Cough    Family History  Problem Relation Age of Onset   Stroke Mother    Diabetes Mother    Hypertension Mother    Anuerysm Father 48       Cerebral   Asthma Sister    Breast cancer Sister    Pulmonary disease Brother        Black lung   Stroke Sister    Cirrhosis Brother    Hypertension Son    Asthma Brother     Prior to Admission medications   Medication Sig Start Date End  Date Taking? Authorizing Provider  acetaminophen (TYLENOL) 500 MG tablet Place 500-1,000 mg into feeding tube See admin instructions. 2 entries on MAR: Give 1000mg  (2 tablet) via G-tube twice daily and 500 mg (1 tablet) every 6 hours as needed for pain.    [provider]  amLODipine (NORVASC) 10 MG tablet Place 1 tablet (10 mg total) into feeding tube daily. 08/23/22   Setzer, Lynnell Jude, PA-C  ascorbic acid (VITAMIN C) 500 MG tablet Place 1 tablet (500 mg total) into feeding tube daily. 08/23/22   Setzer, Lynnell Jude, PA-C  atorvastatin (LIPITOR) 80 MG tablet Place 1 tablet (80 mg total) into feeding tube daily. 08/23/22   Setzer, Lynnell Jude, PA-C  carvedilol (COREG) 6.25 MG tablet Place 1 tablet (6.25 mg total) into feeding tube 2 (two) times daily with a meal. 08/22/22   Setzer, Lynnell Jude, PA-C  ezetimibe (ZETIA) 10 MG tablet Place 1 tablet (10 mg total) into feeding tube daily. 08/23/22   Setzer, Lynnell Jude, PA-C  famotidine (PEPCID) 20 MG tablet Place 1 tablet (20 mg total) into feeding tube daily at 4 PM. 04/30/23   Morene Crocker, MD  Ferrous Sulfate (ONE VITE FERROUS SULFATE) 300 MG/6.8ML SOLN Give 9.27 mLs by tube daily.    [provider]  FLUoxetine (PROZAC) 20 MG capsule Place 1 capsule (20 mg total) into feeding tube at bedtime. 08/22/22   Setzer, Lynnell Jude, PA-C  gabapentin (NEURONTIN) 250 MG/5ML solution Place 2 mLs (100 mg total) into feeding tube every 8 (eight) hours. 08/22/22   Setzer, Lynnell Jude, PA-C  guaiFENesin (ROBITUSSIN) 100 MG/5ML liquid Place 100 mg into feeding tube 4 (four) times daily.    [provider]  insulin lispro (HUMALOG) 100 UNIT/ML KwikPen Inject 0-16 Units into the skin See admin instructions. Inject 0-16 units 4 times daily per sliding scale: 70-150 : 0 units 151-200 : 3 units 201-250 : 6 units 251-300 : 9 units 301-350 : 12 units 351-400 : 16 units > 401 : notify MD    [provider]  ipratropium-albuterol (DUONEB) 0.5-2.5 (3)  MG/3ML SOLN Take 3 mLs by nebulization every 4 (four) hours as needed. 02/18/23   Ghimire, Werner Lean, MD  l-methylfolate-B6-B12 (METANX) 3-35-2 MG TABS tablet Place 1 tablet into feeding tube daily. 08/23/22   Setzer, Lynnell Jude, PA-C  levETIRAcetam (KEPPRA) 100 MG/ML solution Place 5 mLs (500 mg total) into feeding tube 2 (two) times daily. 01/22/23   Micki Riley, MD  lidocaine 4 % Place 1 patch onto the skin See admin instructions. Apply 1 patch to neck and 1 patch to right knee topically in the morning and remove per schedule.    [provider]  metFORMIN (GLUCOPHAGE) 500 MG tablet Place 1 tablet (500 mg total) into feeding tube 2 (two) times daily with a meal. 08/22/22   Setzer, Lynnell Jude, PA-C  Multiple Vitamins-Minerals (PRESERVISION AREDS 2) CHEW Give 1 each by tube 2 (two) times daily.  [provider]  Nutritional Supplements (FEEDING SUPPLEMENT, GLUCERNA 1.5 CAL,) LIQD Place 40 mL/hr into feeding tube daily. 04/30/23   Morene Crocker, MD  OXYGEN Inhale 4 L/min into the lungs continuous.    [provider]  Pantoprazole Sodium POWD Give 1 packet by tube See admin instructions. Pantoprazole DR suspension packets. Give 1 packet (40 mg) via G-tube once daily.    [provider]  Protein (FEEDING SUPPLEMENT, PROSOURCE TF20,) liquid Place 60 mLs into feeding tube daily. 04/30/23   Morene Crocker, MD  Water For Irrigation, Sterile (FREE WATER) SOLN Place 30-120 mLs into feeding tube See admin instructions. Place 120 mL into feeding tube every 4 hours/6 times a day. Flush tube with 30 mL water before and after giving medications through tube, three times a day.    [provider]    Physical Exam: Vitals:   06/12/23 2230 06/12/23 2300 06/12/23 2330 06/13/23 0000  BP: 136/68 125/69 128/77 135/82  Pulse: 91 92 95 100  Resp: (!) 21 (!) 22 (!) 27 17  Temp:      SpO2: 94% 94% 96% 94%   Constitutional: NAD, calm, comfortable, thin  cachetic female lying in bed Eyes: lids and conjunctivae normal ENMT: Mucous membranes are moist.  Neck: normal, supple Respiratory: clear to auscultation bilaterally, no wheezing, no crackles. Normal respiratory effort. No accessory muscle use.  Cardiovascular: Regular rate and rhythm, no murmurs / rubs / gallops. No extremity edema.  Abdomen: no tenderness, soft, non-distended. PEG tube in place.  Musculoskeletal: no clubbing / cyanosis. No joint deformity upper and lower extremities. Good ROM, no contractures. Normal muscle tone.  Skin: no rashes, lesions, ulcers. No induration Neurologic: Alert with contracted upon extremities. She will attempt to answer questions but difficult to understand due to dysarthria.  Psychiatric: Normal mood.   Data Reviewed:  See HPI  Assessment and Plan: * Acute GI bleeding Patient presented to the ED due to concerns of acute anemia at [name].  Hemoglobin on outpatient lab noted to be 6.9 although repeat here is stable at 8.1.  FOBT is positive.  Has history of peptic ulcer disease iron deficiency anemia. -Had has history of GI bleed most recently in late October.  Had GI consultation but since hemoglobin remained stable and due to her poor functional status EGD was not recommended at the time. -Message has been sent to [facility] by EDP who will see in consultation tomorrow.  Unlikely to have any intervention if her hemoglobin remained stable on repeat in the morning. -Keep on PPI  Dysphagia History of severe dysphagia following a history of subdural hematoma and had PEG tube placed in February -Continue to be n.p.o. -keep on gentle IV fluid overnight and then can resume tube feeds in the morning  History of intracranial hemorrhage -with residual hemiparesis, dysarthria and dysphagia -continue Keppra and Gabapentin  Essential hypertension -holding amlodipine and Coreg until the morning due to acute anemia  Type 2 diabetes mellitus (HCC) -  Controlled.  Last hemoglobin A1c of 5.8 in August      Advance Care Planning: CPR only, NO INTUBATION OR DEFIBRILLATION  Consults: [facility]  Family Communication: Daughter at bedside  Severity of Illness: The appropriate patient status for this patient is OBSERVATION. Observation status is judged to be reasonable and necessary in order to provide the required intensity of service to ensure the patient's safety. The patient's presenting symptoms, physical exam findings, and initial radiographic and laboratory data in the context of their medical  condition is felt to place them at decreased risk for further clinical deterioration. Furthermore, it is anticipated that the patient will be medically stable for discharge from the hospital within 2 midnights of admission.   Author: Anselm Jungling, DO 06/13/2023 1:28 AM  For on call review [email or web address].

## 2023-06-14 DIAGNOSIS — K922 Gastrointestinal hemorrhage, unspecified: Secondary | ICD-10-CM | POA: Diagnosis not present

## 2023-06-14 DIAGNOSIS — N181 Chronic kidney disease, stage 1: Secondary | ICD-10-CM | POA: Diagnosis not present

## 2023-06-14 DIAGNOSIS — Z8679 Personal history of other diseases of the circulatory system: Secondary | ICD-10-CM | POA: Diagnosis not present

## 2023-06-14 DIAGNOSIS — E1122 Type 2 diabetes mellitus with diabetic chronic kidney disease: Secondary | ICD-10-CM | POA: Diagnosis not present

## 2023-06-14 DIAGNOSIS — Z7401 Bed confinement status: Secondary | ICD-10-CM | POA: Diagnosis not present

## 2023-06-14 DIAGNOSIS — R58 Hemorrhage, not elsewhere classified: Secondary | ICD-10-CM | POA: Diagnosis not present

## 2023-06-14 LAB — BASIC METABOLIC PANEL
Anion gap: 7 (ref 5–15)
BUN: 10 mg/dL (ref 8–23)
CO2: 23 mmol/L (ref 22–32)
Calcium: 8.2 mg/dL — ABNORMAL LOW (ref 8.9–10.3)
Chloride: 102 mmol/L (ref 98–111)
Creatinine, Ser: 0.33 mg/dL — ABNORMAL LOW (ref 0.44–1.00)
GFR, Estimated: >60 mL/min (ref >60)
Glucose, Bld: 90 mg/dL (ref 70–99)
Potassium: 3.4 mmol/L — ABNORMAL LOW (ref 3.5–5.1)
Sodium: 132 mmol/L — ABNORMAL LOW (ref 135–145)

## 2023-06-14 LAB — CBC
HCT: 26.9 % — ABNORMAL LOW (ref 36.0–46.0)
Hemoglobin: 8.6 g/dL — ABNORMAL LOW (ref 12.0–15.0)
MCH: 30.2 pg (ref 26.0–34.0)
MCHC: 32 g/dL (ref 30.0–36.0)
MCV: 94.4 fL (ref 80.0–100.0)
Platelets: 305 10*3/uL (ref 150–400)
RBC: 2.85 MIL/uL — ABNORMAL LOW (ref 3.87–5.11)
RDW: 21 % — ABNORMAL HIGH (ref 11.5–15.5)
WBC: 9 10*3/uL (ref 4.0–10.5)
nRBC: 0 % (ref 0.0–0.2)

## 2023-06-14 MED ORDER — ACETAMINOPHEN 325 MG PO TABS
650.0000 mg | ORAL_TABLET | Freq: Four times a day (QID) | ORAL | Status: DC | PRN
  Filled 2023-06-14: qty 2

## 2023-06-14 MED ORDER — ACETAMINOPHEN 325 MG PO TABS
650.0000 mg | ORAL_TABLET | Freq: Four times a day (QID) | ORAL | Status: DC | PRN
  Administered 2023-06-14: 650 mg
  Filled 2023-06-14: qty 2

## 2023-06-14 MED ORDER — PANTOPRAZOLE SODIUM POWD: 1.0000 | Freq: Two times a day (BID) | Status: DC

## 2023-06-14 NOTE — Discharge Summary (Signed)
Physician Discharge Summary  Gina Kelley RUE:[id number] DOB: 05-15-1936 DOA: 06/12/2023  PCP: System, Provider Not In  Admit date: 06/12/2023 Discharge date: 06/14/2023  Admitted From: ( [name]) Disposition:  ([name])  Recommendations for Outpatient Follow-up:  Please monitor CBC regularly, family has expressed their wishes for not wanting EGD to be performed, and plan is for supportive transfusions, so patient likely will need to be on remittent PRBC transfusions as an outpatient as well very likely IV iron, ambulatory referral has been sent to hematology clinic, as well this can be performed independently at the facility if desired, I would target keep her hemoglobin more than 8, otherwise she will have panic low values which will require admission to the hospital.   Discharge Condition: (Stable) CODE STATUS:DNR, confirmed by daughter [name] Diet recommendation: All meds and feeding via PEG.  Brief/Interim Summary:  Acute on chronic anemia Iron deficiency anemia GI bleed -Sent from [name] for low hemoglobin of 6.9, repeat in ED was 8.1, this morning hemoglobin of 7.3, FOBT is positive.  Patient was seen by GI, discussed with family about endoscopy, but family wished to continue with conservative management, supportive transfusion as needed, so no plan for endoscopy currently or in the future, she was transfused 1 unit PRBC 06/12/2023, her hemoglobin improved at time of discharge at 8.6. -Increase her Protonix to 40 mg by PEG twice daily. -She will need her CBC checked regularly, as well she will need intermittent transfusions, please see above discussion.  Marland Kitchen  Dysphagia History of severe dysphagia following a history of subdural hematoma and had PEG tube placed in February -Continue to be n.p.o.   History of intracranial hemorrhage -with residual hemiparesis, dysarthria and dysphagia -continue Keppra and Gabapentin   Essential hypertension -Continue with home  medications  Hypokalemia -Replaced  Hyponatremia -Mild, symptomatic.   Type 2 diabetes mellitus (HCC) - Controlled.  Last hemoglobin A1c of 5.8 in August continue with home regimen    Discharge Diagnoses:  Principal Problem:   Acute GI bleeding Active Problems:   History of intracranial hemorrhage   Dysphagia   Essential hypertension   Type 2 diabetes mellitus (HCC)   Heme positive stool   ABLA (acute blood loss anemia)    Discharge Instructions  Discharge Instructions     Ambulatory referral to Hematology / Oncology   Complete by: As directed    Patient with chronic blood loss anemia-iron deficiency anemia, will need IV iron, and will need remittent PRBC transfusions to keep able hemoglobin.   Discharge instructions   Complete by: As directed    Follow with [name] physician  Get CBC, CMP,   Disposition [name]   Diet: strictly NPO , all fee   Increase activity slowly   Complete by: As directed       Allergies as of 06/14/2023       Reactions   Cozaar [losartan] Swelling   Angioedema    Periogard [chlorhexidine Gluconate] Swelling, Other (See Comments)   Skin irritation Eye/tongue/lip swelling   Pork Allergy Other (See Comments)   Unknown reaction   Vasotec [enalapril] Cough        Medication List     TAKE these medications    acetaminophen 500 MG tablet Commonly known as: TYLENOL Place 1,000 mg into feeding tube in the morning and at bedtime.   acetaminophen 500 MG tablet Commonly known as: TYLENOL Place 500 mg into feeding tube every 6 (six) hours as needed for mild pain (pain score 1-3).   amLODipine 10  MG tablet Commonly known as: NORVASC Place 1 tablet (10 mg total) into feeding tube daily.   ascorbic acid 500 MG tablet Commonly known as: VITAMIN C Place 1 tablet (500 mg total) into feeding tube daily.   atorvastatin 80 MG tablet Commonly known as: LIPITOR Place 1 tablet (80 mg total) into feeding tube daily.   carvedilol 6.25 MG  tablet Commonly known as: COREG Place 1 tablet (6.25 mg total) into feeding tube 2 (two) times daily with a meal.   ezetimibe 10 MG tablet Commonly known as: ZETIA Place 1 tablet (10 mg total) into feeding tube daily.   famotidine 20 MG tablet Commonly known as: PEPCID Place 1 tablet (20 mg total) into feeding tube daily at 4 PM.   feeding supplement (GLUCERNA 1.5 CAL) Liqd Place 40 mL/hr into feeding tube daily. What changed:  how much to take when to take this additional instructions   feeding supplement (PROSource TF20) liquid Place 60 mLs into feeding tube daily.   FLUoxetine 20 MG capsule Commonly known as: PROZAC Place 1 capsule (20 mg total) into feeding tube at bedtime.   free water Soln Place 120 mLs into feeding tube every 4 (four) hours.   STERILE WATER FOR IRRIGATION IR Place 30 mLs into feeding tube in the morning, at noon, and at bedtime. Flush tube with 30ml of water before and after giving medications   gabapentin 250 MG/5ML solution Commonly known as: NEURONTIN Place 2 mLs (100 mg total) into feeding tube every 8 (eight) hours.   guaiFENesin 100 MG/5ML liquid Commonly known as: ROBITUSSIN Place 100 mg into feeding tube 4 (four) times daily.   insulin lispro 100 UNIT/ML KwikPen Commonly known as: HUMALOG Inject 0-16 Units into the skin See admin instructions. Inject 0-16 units 4 times daily per sliding scale: 70-150 : 0 units 151-200 : 3 units 201-250 : 6 units 251-300 : 9 units 301-350 : 12 units 351-400 : 16 units > 401 : notify MD   ipratropium-albuterol 0.5-2.5 (3) MG/3ML Soln Commonly known as: DUONEB Take 3 mLs by nebulization every 4 (four) hours as needed.   l-methylfolate-B6-B12 3-35-2 MG Tabs tablet Commonly known as: METANX Place 1 tablet into feeding tube daily.   levETIRAcetam 100 MG/ML solution Commonly known as: Keppra Place 5 mLs (500 mg total) into feeding tube 2 (two) times daily.   lidocaine 5 % Commonly known as:  LIDODERM Place 1 patch onto the skin See admin instructions. Apply 1 patch to right knee and neck; utilized 12hrs on/off schedule   metFORMIN 500 MG tablet Commonly known as: GLUCOPHAGE Place 1 tablet (500 mg total) into feeding tube 2 (two) times daily with a meal.   metoCLOPramide 5 MG tablet Commonly known as: REGLAN Take 5 mg by mouth 3 (three) times daily.   One Vite Ferrous Sulfate 300 MG/6.8ML Soln Generic drug: Ferrous Sulfate Give 9.27 mLs by tube daily.   OXYGEN Inhale 4 L/min into the lungs continuous.   Pantoprazole Sodium Powd 1 packet by Gastric Tube route 2 (two) times daily. What changed: when to take this   PreserVision AREDS 2 Chew Give 1 each by tube 2 (two) times daily.   Pro-Stat Liqd Place 60 mLs into feeding tube daily.        Allergies  Allergen Reactions   Cozaar [Losartan] Swelling    Angioedema    Periogard [Chlorhexidine Gluconate] Swelling and Other (See Comments)    Skin irritation Eye/tongue/lip swelling   Pork Allergy Other (See Comments)  Unknown reaction   Vasotec [Enalapril] Cough    Consultations: Gastroenterology  Procedures/Studies: No results found.    Subjective: No significant events overnight as discussed with staff, patient appears comfortable today.  Discharge Exam: Vitals:   06/14/23 0500 06/14/23 0826  BP: (!) 160/80 (!) 149/73  Pulse: 78 80  Resp: 18 18  Temp: 98 F (36.7 C) 98.2 F (36.8 C)  SpO2: 99% 100%   Vitals:   06/14/23 0000 06/14/23 0300 06/14/23 0500 06/14/23 0826  BP:  (!) 153/80 (!) 160/80 (!) 149/73  Pulse:  82 78 80  Resp:  20 18 18   Temp: 98 F (36.7 C)  98 F (36.7 C) 98.2 F (36.8 C)  TempSrc:   Oral Oral  SpO2:   99% 100%    General: Pt is awake, not in acute distress, chronically ill-appearing, pleasant Cardiovascular: RRR, S1/S2 +, no rubs, no gallops Respiratory: CTA bilaterally, no wheezing, no rhonchi Abdominal: PEG+, bowel sounds + Extremities: no edema, no  cyanosis    The results of significant diagnostics from this hospitalization (including imaging, microbiology, ancillary and laboratory) are listed below for reference.     Microbiology: No results found for this or any previous visit (from the past 240 hours).   Labs: BNP (last 3 results) Recent Labs    03/09/23 0748  BNP 235.4*   Basic Metabolic Panel: Recent Labs  Lab 06/12/23 2203 06/13/23 0437 06/14/23 0538  NA 137 135 132*  K 3.9 3.4* 3.4*  CL 95* 97* 102  CO2 31 32 23  GLUCOSE 104* 81 90  BUN 21 16 10   CREATININE 0.55 0.51 0.33*  CALCIUM 9.0 8.6* 8.2*   Liver Function Tests: Recent Labs  Lab 06/12/23 2203 06/13/23 0437  AST 129* 94*  ALT 92* 77*  ALKPHOS 111 80  BILITOT 0.6 0.4  PROT 7.3 6.8  ALBUMIN 2.3* 2.2*   No results for input(s): "LIPASE", "AMYLASE" in the last 168 hours. No results for input(s): "AMMONIA" in the last 168 hours. CBC: Recent Labs  Lab 06/12/23 2203 06/13/23 0437 06/14/23 0538  WBC 9.7 10.6* 9.0  NEUTROABS 7.4  --   --   HGB 8.1* 7.3* 8.6*  HCT 27.3* 23.6* 26.9*  MCV 96.5 95.2 94.4  PLT 381 344 305   Cardiac Enzymes: No results for input(s): "CKTOTAL", "CKMB", "CKMBINDEX", "TROPONINI" in the last 168 hours. BNP: Invalid input(s): "POCBNP" CBG: No results for input(s): "GLUCAP" in the last 168 hours. D-Dimer No results for input(s): "DDIMER" in the last 72 hours. Hgb A1c No results for input(s): "HGBA1C" in the last 72 hours. Lipid Profile No results for input(s): "CHOL", "HDL", "LDLCALC", "TRIG", "CHOLHDL", "LDLDIRECT" in the last 72 hours. Thyroid function studies No results for input(s): "TSH", "T4TOTAL", "T3FREE", "THYROIDAB" in the last 72 hours.  Invalid input(s): "FREET3" Anemia work up No results for input(s): "VITAMINB12", "FOLATE", "FERRITIN", "TIBC", "IRON", "RETICCTPCT" in the last 72 hours. Urinalysis    Component Value Date/Time   COLORURINE YELLOW 03/09/2023 2030   APPEARANCEUR CLEAR 03/09/2023  2030   LABSPEC 1.036 (H) 03/09/2023 2030   PHURINE 5.0 03/09/2023 2030   GLUCOSEU NEGATIVE 03/09/2023 2030   HGBUR NEGATIVE 03/09/2023 2030   BILIRUBINUR NEGATIVE 03/09/2023 2030   KETONESUR NEGATIVE 03/09/2023 2030   PROTEINUR 100 (A) 03/09/2023 2030   NITRITE NEGATIVE 03/09/2023 2030   LEUKOCYTESUR NEGATIVE 03/09/2023 2030   Sepsis Labs Recent Labs  Lab 06/12/23 2203 06/13/23 0437 06/14/23 0538  WBC 9.7 10.6* 9.0   Microbiology No results  found for this or any previous visit (from the past 240 hours).   Time coordinating discharge: Over 30 minutes  SIGNED:   Huey Bienenstock, MD  [facility] 06/14/2023, 11:01 AM Pager   If 7PM-7AM, please contact night-coverage [email or web address] Password [email or web address]

## 2023-06-14 NOTE — TOC Transition Note (Signed)
Transition of Care [facility]) - Discharge Note   Patient Details  Name: Gina Kelley MRN: [id number] Date of Birth: 02/02/1936  [facility] CM/SW Contact:  Ralene Bathe, LCSW Phone Number: 06/14/2023, 12:01 PM   Clinical Narrative:    Patient will DC to:  [facility] Anticipated DC date: 06/14/2023 Family notified: LM for daughter, [name]   Per MD patient ready for DC to LT- [name]. RN to call report prior to discharge [phone]). RN, patient's family, and facility notified of DC. Discharge Summary sent to facility. DC packet on chart. Ambulance transport will be requested for patient.   CSW will sign off for now as social work intervention is no longer needed. Please consult [place] again if new needs arise.    Final next level of care: Long Term Nursing Home Barriers to Discharge: No Barriers Identified   Patient Goals and CMS Choice            Discharge Placement              Patient chooses bed at: [facility] Patient to be transferred to facility by: [facility] Name of family member notified: Gwenette Greet (Daughter)  [phone] Patient and family notified of of transfer: 06/14/23  Discharge Plan and Services Additional resources added to the After Visit Summary for   In-house Referral: Clinical Social Work                                   Social Drivers of Health (SDOH) Interventions SDOH Screenings   Food Insecurity: No Food Insecurity (06/13/2023)  Housing: Low Risk  (06/13/2023)  Transportation Needs: No Transportation Needs (06/13/2023)  Utilities: Not At Risk (06/13/2023)  Depression (PHQ2-9): Low Risk  (11/15/2021)  Tobacco Use: Medium Risk (06/12/2023)     Readmission Risk Interventions     No data to display

## 2023-06-14 NOTE — Discharge Instructions (Signed)
Follow with [name] physician  Get CBC, CMP,   Disposition [name]   Diet: strictly NPO , all feed and meds by PEG

## 2023-06-14 NOTE — Progress Notes (Signed)
Patient is alert with confusion. BP 159/90, 160/80 hr 79, IV fluid @ 75 mls/hr. MD aware. PT complained pain ,did not sleep much during the night. MD notified , was given 650 mg Tylenol. Patient is resting , relax and sleeping after taking 650 mg Tylenol.

## 2023-06-14 NOTE — Progress Notes (Signed)
Attempted to call report to [facility]. Spoke with receptionist who informed RN that the nurse that will taking the patient is on "break" and will "call me back". RN will attempt to call again in 30 minutes.

## 2023-06-15 DIAGNOSIS — Z515 Encounter for palliative care: Secondary | ICD-10-CM | POA: Diagnosis not present

## 2023-06-16 LAB — TYPE AND SCREEN
ABO/RH(D): O POS
Antibody Screen: NEGATIVE
Unit division: 0
Unit division: 0

## 2023-06-16 LAB — BPAM RBC
Blood Product Expiration Date: 202412282359
Blood Product Expiration Date: 202412202359
ISSUE DATE / TIME: 202412141449
Unit Type and Rh: 5100
Unit Type and Rh: 9500

## 2023-06-17 DIAGNOSIS — I10 Essential (primary) hypertension: Secondary | ICD-10-CM | POA: Diagnosis not present

## 2023-06-17 DIAGNOSIS — D649 Anemia, unspecified: Secondary | ICD-10-CM | POA: Diagnosis not present

## 2023-06-23 DIAGNOSIS — I10 Essential (primary) hypertension: Secondary | ICD-10-CM | POA: Diagnosis not present

## 2023-06-23 DIAGNOSIS — D649 Anemia, unspecified: Secondary | ICD-10-CM | POA: Diagnosis not present

## 2023-06-23 DIAGNOSIS — F0150 Vascular dementia without behavioral disturbance: Secondary | ICD-10-CM | POA: Diagnosis not present

## 2023-06-30 DIAGNOSIS — I69354 Hemiplegia and hemiparesis following cerebral infarction affecting left non-dominant side: Secondary | ICD-10-CM | POA: Diagnosis not present

## 2023-07-01 DIAGNOSIS — I69354 Hemiplegia and hemiparesis following cerebral infarction affecting left non-dominant side: Secondary | ICD-10-CM | POA: Diagnosis not present

## 2023-07-02 DIAGNOSIS — I69354 Hemiplegia and hemiparesis following cerebral infarction affecting left non-dominant side: Secondary | ICD-10-CM | POA: Diagnosis not present

## 2023-07-03 DIAGNOSIS — I69354 Hemiplegia and hemiparesis following cerebral infarction affecting left non-dominant side: Secondary | ICD-10-CM | POA: Diagnosis not present

## 2023-07-06 ENCOUNTER — Encounter: Payer: Self-pay | Admitting: Hematology and Oncology

## 2023-07-06 ENCOUNTER — Inpatient Hospital Stay: Payer: Medicare HMO

## 2023-07-06 ENCOUNTER — Inpatient Hospital Stay: Payer: Medicare HMO | Attending: Hematology and Oncology | Admitting: Hematology and Oncology

## 2023-07-06 VITALS — BP 152/71 | HR 77 | Temp 98.2°F | Resp 18 | Ht 64.0 in | Wt 120.0 lb

## 2023-07-06 DIAGNOSIS — Z79899 Other long term (current) drug therapy: Secondary | ICD-10-CM | POA: Diagnosis not present

## 2023-07-06 DIAGNOSIS — Z87891 Personal history of nicotine dependence: Secondary | ICD-10-CM | POA: Diagnosis not present

## 2023-07-06 DIAGNOSIS — D62 Acute posthemorrhagic anemia: Secondary | ICD-10-CM

## 2023-07-06 DIAGNOSIS — G8114 Spastic hemiplegia affecting left nondominant side: Secondary | ICD-10-CM | POA: Diagnosis not present

## 2023-07-06 DIAGNOSIS — Z803 Family history of malignant neoplasm of breast: Secondary | ICD-10-CM | POA: Diagnosis not present

## 2023-07-06 DIAGNOSIS — D500 Iron deficiency anemia secondary to blood loss (chronic): Secondary | ICD-10-CM | POA: Insufficient documentation

## 2023-07-06 DIAGNOSIS — I69354 Hemiplegia and hemiparesis following cerebral infarction affecting left non-dominant side: Secondary | ICD-10-CM | POA: Diagnosis not present

## 2023-07-06 LAB — CBC WITH DIFFERENTIAL/PLATELET
Abs Immature Granulocytes: 0.05 10*3/uL (ref 0.00–0.07)
Basophils Absolute: 0 10*3/uL (ref 0.0–0.1)
Basophils Relative: 0 %
Eosinophils Absolute: 0.2 10*3/uL (ref 0.0–0.5)
Eosinophils Relative: 2 %
HCT: 30.7 % — ABNORMAL LOW (ref 36.0–46.0)
Hemoglobin: 9.8 g/dL — ABNORMAL LOW (ref 12.0–15.0)
Immature Granulocytes: 1 %
Lymphocytes Relative: 8 %
Lymphs Abs: 0.8 10*3/uL (ref 0.7–4.0)
MCH: 29.7 pg (ref 26.0–34.0)
MCHC: 31.9 g/dL (ref 30.0–36.0)
MCV: 93 fL (ref 80.0–100.0)
Monocytes Absolute: 1 10*3/uL (ref 0.1–1.0)
Monocytes Relative: 11 %
Neutro Abs: 7.5 10*3/uL (ref 1.7–7.7)
Neutrophils Relative %: 78 %
Platelets: 353 10*3/uL (ref 150–400)
RBC: 3.3 MIL/uL — ABNORMAL LOW (ref 3.87–5.11)
RDW: 18.1 % — ABNORMAL HIGH (ref 11.5–15.5)
WBC: 9.5 10*3/uL (ref 4.0–10.5)
nRBC: 0 % (ref 0.0–0.2)

## 2023-07-06 LAB — FERRITIN: Ferritin: 51 ng/mL (ref 11–307)

## 2023-07-06 LAB — SAMPLE TO BLOOD BANK

## 2023-07-06 LAB — IRON AND IRON BINDING CAPACITY (CC-WL,HP ONLY)
Iron: 20 ug/dL — ABNORMAL LOW (ref 28–170)
Saturation Ratios: 6 % — ABNORMAL LOW (ref 10.4–31.8)
TIBC: 333 ug/dL (ref 250–450)
UIBC: 313 ug/dL (ref 148–442)

## 2023-07-06 LAB — ABO/RH: ABO/RH(D): O POS

## 2023-07-06 NOTE — Assessment & Plan Note (Signed)
 She had history of iron  deficiency anemia Her last iron  study from December showed adequate replacement Repeat iron  studies today is pending but with high MCV, I suspect she is not iron  deficient However, given her history of recurrent GI bleed, I think it is beneficial for her to continue iron  supplement through her feeding tube I recommend repeat CBC at skilled facility in 2 months Her daughter is instructed to call me if her hemoglobin dropped to less than 9 I suspect there is a component of anemia chronic illness

## 2023-07-06 NOTE — Progress Notes (Signed)
 Gina Kelley CONSULT NOTE  Patient Care Team: System, Provider Not In as PCP - General Le, My [place], [place] as Referring Physician (Optometry) Avram Lupita BRAVO, MD as Consulting Physician (Gastroenterology)  ASSESSMENT & PLAN:  Iron  deficiency anemia due to chronic blood loss She had history of iron  deficiency anemia Her last iron  study from December showed adequate replacement Repeat iron  studies today is pending but with high MCV, I suspect she is not iron  deficient However, given her history of recurrent GI bleed, I think it is beneficial for her to continue iron  supplement through her feeding tube I recommend repeat CBC at skilled facility in 2 months Her daughter is instructed to call me if her hemoglobin dropped to less than 9 I suspect there is a component of anemia chronic illness  Spastic hemiplegia affecting left nondominant side (HCC) Due to limited mobility, she has to be brought to the clinic with a stretcher To minimize transportation back and forth, I will direct her nursing home to monitor her blood count carefully in 2 months and will bring her back in the future if she needs blood transfusion or IV iron   Orders Placed This Encounter  Procedures   CBC with Differential/Platelet    Standing Status:   Standing    Number of Occurrences:   22    Expiration Date:   07/05/2024   Ferritin    Standing Status:   Future    Number of Occurrences:   1    Expiration Date:   07/05/2024   Iron  and Iron  Binding Capacity ([facility] only)    Standing Status:   Future    Number of Occurrences:   1    Expiration Date:   07/05/2024   ABO/Rh    Standing Status:   Future    Number of Occurrences:   1    Expiration Date:   07/05/2024   Sample to Blood Bank    Standing Status:   Standing    Number of Occurrences:   33    Expiration Date:   07/05/2024    All questions were answered. The patient knows to call the clinic with any problems, questions or concerns.  The total time spent  in the appointment was 60 minutes encounter with patients including review of chart and various tests results, discussions about plan of care and coordination of care plan  Gina Bedford, MD 1/6/20251:19 PM   CHIEF COMPLAINTS/PURPOSE OF CONSULTATION:  Anemia  HISTORY OF PRESENTING ILLNESS:  Gina Kelley 88 y.o. female is here because of anemia She is here accompanied by her daughter, Rumalda who provided most of the history Due to her history of stroke, she had limited speech and mobility and is currently residing in a skilled nursing facility The patient is known to have recurrent severe anemia with GI blood loss She was just discharged from the hospital on December 15 for another episode of anemia status post transfusion Due to her significant comorbidities, informed decision was made not to pursue EGD or colonoscopy. There were no reported recent visible blood loss She is dependent on PEG tube for feeding.  She is on different antiacid medications due to history of reflux She is prescribed iron  supplement to be given through her feeding tube  MEDICAL HISTORY:  Past Medical History:  Diagnosis Date   Anemia of chronic disease 07/08/2006   Antral ulcer 02/23/2007   Seen on EGD in 2008, small ulcer with erosion    Cerebral vascular accident (  HCC) 04/14/2006   1998, left lower extremity numbness, no residual deficits    Essential hypertension 04/14/2006   Gastroesophageal reflux disease 02/18/2013   Occasional, symptomatically relieved with peptobismol    Healthcare maintenance 12/04/2011   Hyperlipidemia LDL goal < 100 04/14/2006   Hypertensive retinopathy of both eyes, grade 1 05/15/2016   Osteopenia of right femoral neck 10/20/2016   DEXA (10/16/2016): R femur T -2.5 (FRAX tool calculates at -2.4), L1-L4 spine T -0.9, 10 year risk for: Major osteoporotic fracture 8.3%, Hip fracture 2.7%   Seizure disorder (HCC) 03/01/2023   Type 2 diabetes mellitus with stage 1 chronic kidney  disease, without long-term current use of insulin  (HCC)    Type II diabetes mellitus (HCC) 04/14/2006    SURGICAL HISTORY: Past Surgical History:  Procedure Laterality Date   COLONOSCOPY     CRANIOTOMY Right 07/08/2022   Procedure: RIGHT CRANIOTOMY HEMATOMA EVACUATION SUBDURAL;  Surgeon: Louis Shove, MD;  Location: [facility] OR;  Service: Neurosurgery;  Laterality: Right;   ESOPHAGOGASTRODUODENOSCOPY     ESOPHAGOGASTRODUODENOSCOPY (EGD) WITH PROPOFOL  N/A 08/01/2022   Procedure: ESOPHAGOGASTRODUODENOSCOPY (EGD) WITH PROPOFOL ;  Surgeon: Paola Dreama SAILOR, MD;  Location: [facility] ENDOSCOPY;  Service: General;  Laterality: N/A;   PEG PLACEMENT N/A 08/01/2022   Procedure: PERCUTANEOUS ENDOSCOPIC GASTROSTOMY (PEG) PLACEMENT;  Surgeon: Paola Dreama SAILOR, MD;  Location: [facility] ENDOSCOPY;  Service: General;  Laterality: N/A;    SOCIAL HISTORY: Social History   Socioeconomic History   Marital status: Married    Spouse name: Not on file   Number of children: Not on file   Years of education: Not on file   Highest education level: Not on file  Occupational History   Occupation: Retired     Comment: [employer]  Tobacco Use   Smoking status: Former    Current packs/day: 0.00    Types: Cigarettes    Quit date: 06/30/1978    Years since quitting: 45.0   Smokeless tobacco: Never  Vaping Use   Vaping status: Never Used  Substance and Sexual Activity   Alcohol  use: No   Drug use: No   Sexual activity: Not Currently  Other Topics Concern   Not on file  Social History Narrative     Current social history  Resides in [facility] since April 2024   Bedbound   Peg tube   Expressive aphasia   Droop            Current Social History 09/07/2020        Patient lives with husband in one level home with 5 outside steps with handrails on both sides        Patient's method of transportation is personal car (drives herself)      The highest level of education was 9 th grade      The patient currently retired  from [employer] (due to [employer]).      Identified important Relationships are, My family       Pets : None       Interests / Fun: Go to [employer], sing, read. Went to gym before [employer].       Current Stressors: I don't have any. I sing it away.       Religious / Personal Beliefs: I believe in my Lord and Savior, Jesus Christ, the son of the Living God.       Other: I like to follow the rules and regulations. (Covid guidelines) I'm easy to get along with.  FREDRIK Alcide, BSN, RN-BC       Social Drivers of Health   Financial Resource Strain: Not on file  Food Insecurity: No Food Insecurity (06/13/2023)   Hunger Vital Sign    Worried About Running Out of Food in the Last Year: Never true    Ran Out of Food in the Last Year: Never true  Transportation Needs: No Transportation Needs (06/13/2023)   PRAPARE - Administrator, Civil Service (Medical): No    Lack of Transportation (Non-Medical): No  Physical Activity: Not on file  Stress: Not on file  Social Connections: Not on file  Intimate Partner Violence: Not At Risk (06/13/2023)   Humiliation, Afraid, Rape, and Kick questionnaire    Fear of Current or Ex-Partner: No    Emotionally Abused: No    Physically Abused: No    Sexually Abused: No    FAMILY HISTORY: Family History  Problem Relation Age of Onset   Stroke Mother    Diabetes Mother    Hypertension Mother    Anuerysm Father 4       Cerebral   Asthma Sister    Breast cancer Sister    Pulmonary disease Brother        Black lung   Stroke Sister    Cirrhosis Brother    Hypertension Son    Asthma Brother     ALLERGIES:  is allergic to cozaar  [losartan ], periogard  [chlorhexidine  gluconate], pork allergy, and vasotec  [enalapril ].  MEDICATIONS:  Current Outpatient Medications  Medication Sig Dispense Refill   acetaminophen  (TYLENOL ) 500 MG tablet Place 1,000 mg into feeding tube in the morning and at bedtime.     acetaminophen  (TYLENOL ) 500 MG  tablet Place 500 mg into feeding tube every 6 (six) hours as needed for mild pain (pain score 1-3).     Amino Acids-Protein Hydrolys (PRO-STAT) LIQD Place 60 mLs into feeding tube daily.     carvedilol  (COREG ) 6.25 MG tablet Place 1 tablet (6.25 mg total) into feeding tube 2 (two) times daily with a meal.     ezetimibe  (ZETIA ) 10 MG tablet Place 1 tablet (10 mg total) into feeding tube daily. (Patient taking differently: Place 2,100 mg into feeding tube at bedtime.)     famotidine  (PEPCID ) 20 MG tablet Place 1 tablet (20 mg total) into feeding tube daily at 4 PM.     Ferrous Sulfate  (ONE VITE FERROUS SULFATE ) 300 MG/6.8ML SOLN Give 9.27 mLs by tube daily.     FLUoxetine  (PROZAC ) 20 MG capsule Place 1 capsule (20 mg total) into feeding tube at bedtime.  3   gabapentin  (NEURONTIN ) 250 MG/5ML solution Place 2 mLs (100 mg total) into feeding tube every 8 (eight) hours.  12   guaiFENesin  (ROBITUSSIN) 100 MG/5ML liquid Place 100 mg into feeding tube 4 (four) times daily.     insulin  lispro (HUMALOG ) 100 UNIT/ML KwikPen Inject 0-16 Units into the skin See admin instructions. Inject 0-16 units 4 times daily per sliding scale: 70-150 : 0 units 151-200 : 3 units 201-250 : 6 units 251-300 : 9 units 301-350 : 12 units 351-400 : 16 units > 401 : notify MD     levETIRAcetam  (KEPPRA ) 100 MG/ML solution Place 5 mLs (500 mg total) into feeding tube 2 (two) times daily. 473 mL 12   Multiple Vitamins-Minerals (PRESERVISION AREDS 2) CHEW Give 1 each by tube 2 (two) times daily.     Nutritional Supplements (FEEDING SUPPLEMENT, GLUCERNA 1.5 CAL,) LIQD Place 40 mL/hr into feeding  tube daily. (Patient taking differently: Place 45 mL/hr into feeding tube 3 (three) times daily. Place 45ml into feeding tube continuouslt)     OXYGEN Inhale 4 L/min into the lungs continuous.     Protein (FEEDING SUPPLEMENT, PROSOURCE TF20,) liquid Place 60 mLs into feeding tube daily. (Patient not taking: Reported on 06/13/2023)     Water   For Irrigation, Sterile (FREE WATER ) SOLN Place 120 mLs into feeding tube every 4 (four) hours.     Water  For Irrigation, Sterile (STERILE WATER  FOR IRRIGATION IR) Place 30 mLs into feeding tube in the morning, at noon, and at bedtime. Flush tube with 30ml of water  before and after giving medications     No current facility-administered medications for this visit.    REVIEW OF SYSTEMS: Limited review of system due to her stroke  PHYSICAL EXAMINATION: ECOG PERFORMANCE STATUS: 4 - Bedbound  Vitals:   07/06/23 1124  BP: (!) 152/71  Pulse: 77  Resp: 18  Temp: 98.2 F (36.8 C)  SpO2: 92%   Filed Weights   07/06/23 1124  Weight: 120 lb (54.4 kg)    GENERAL:alert, no distress and comfortable.  Limited examination as the patient is lying on the stretcher. SKIN: skin color, texture, turgor are normal, no rashes or significant lesions EYES: normal, conjunctiva are pink and non-injected, sclera clear OROPHARYNX:no exudate, no erythema and lips, buccal mucosa, and tongue normal  NECK: supple, thyroid  normal size, non-tender, without nodularity LYMPH:  no palpable lymphadenopathy in the cervical, axillary or inguinal LUNGS: clear to auscultation and percussion with normal breathing effort HEART: regular rate & rhythm and no murmurs and no lower extremity edema ABDOMEN:abdomen soft, non-tender and normal bowel sounds.  Feeding tube site looks okay Musculoskeletal:no cyanosis of digits and no clubbing  PSYCH: alert  NEURO: She had focal neurological deficit with weakness from her stroke and limited speech  RADIOGRAPHIC STUDIES: I have reviewed her previous CT imaging of the abdomen and pelvis from 2024 I have personally reviewed the radiological images as listed and agreed with the findings in the report.

## 2023-07-06 NOTE — Assessment & Plan Note (Signed)
 Due to limited mobility, she has to be brought to the clinic with a stretcher To minimize transportation back and forth, I will direct her nursing home to monitor her blood count carefully in 2 months and will bring her back in the future if she needs blood transfusion or IV iron 

## 2023-07-07 DIAGNOSIS — I69354 Hemiplegia and hemiparesis following cerebral infarction affecting left non-dominant side: Secondary | ICD-10-CM | POA: Diagnosis not present

## 2023-07-08 DIAGNOSIS — I69354 Hemiplegia and hemiparesis following cerebral infarction affecting left non-dominant side: Secondary | ICD-10-CM | POA: Diagnosis not present

## 2023-07-09 DIAGNOSIS — I69354 Hemiplegia and hemiparesis following cerebral infarction affecting left non-dominant side: Secondary | ICD-10-CM | POA: Diagnosis not present

## 2023-07-10 DIAGNOSIS — I69354 Hemiplegia and hemiparesis following cerebral infarction affecting left non-dominant side: Secondary | ICD-10-CM | POA: Diagnosis not present

## 2023-07-13 DIAGNOSIS — I69354 Hemiplegia and hemiparesis following cerebral infarction affecting left non-dominant side: Secondary | ICD-10-CM | POA: Diagnosis not present

## 2023-07-14 DIAGNOSIS — I69354 Hemiplegia and hemiparesis following cerebral infarction affecting left non-dominant side: Secondary | ICD-10-CM | POA: Diagnosis not present

## 2023-07-15 DIAGNOSIS — I69354 Hemiplegia and hemiparesis following cerebral infarction affecting left non-dominant side: Secondary | ICD-10-CM | POA: Diagnosis not present

## 2023-07-16 DIAGNOSIS — I69354 Hemiplegia and hemiparesis following cerebral infarction affecting left non-dominant side: Secondary | ICD-10-CM | POA: Diagnosis not present

## 2023-07-17 DIAGNOSIS — I69354 Hemiplegia and hemiparesis following cerebral infarction affecting left non-dominant side: Secondary | ICD-10-CM | POA: Diagnosis not present

## 2023-07-20 ENCOUNTER — Emergency Department (HOSPITAL_COMMUNITY): Payer: Medicare HMO

## 2023-07-20 ENCOUNTER — Other Ambulatory Visit: Payer: Self-pay

## 2023-07-20 ENCOUNTER — Inpatient Hospital Stay (HOSPITAL_COMMUNITY)
Admission: EM | Admit: 2023-07-20 | Discharge: 2023-07-27 | DRG: 871 | Disposition: A | Discharge status: Skilled Nursing Facility | Payer: Medicare HMO | Source: Skilled Nursing Facility | Attending: Internal Medicine | Admitting: Internal Medicine

## 2023-07-20 DIAGNOSIS — Z87891 Personal history of nicotine dependence: Secondary | ICD-10-CM

## 2023-07-20 DIAGNOSIS — D631 Anemia in chronic kidney disease: Secondary | ICD-10-CM | POA: Diagnosis present

## 2023-07-20 DIAGNOSIS — I69222 Dysarthria following other nontraumatic intracranial hemorrhage: Secondary | ICD-10-CM

## 2023-07-20 DIAGNOSIS — E1122 Type 2 diabetes mellitus with diabetic chronic kidney disease: Secondary | ICD-10-CM | POA: Diagnosis present

## 2023-07-20 DIAGNOSIS — F32A Depression, unspecified: Secondary | ICD-10-CM | POA: Diagnosis present

## 2023-07-20 DIAGNOSIS — D509 Iron deficiency anemia, unspecified: Secondary | ICD-10-CM | POA: Diagnosis present

## 2023-07-20 DIAGNOSIS — D62 Acute posthemorrhagic anemia: Secondary | ICD-10-CM | POA: Diagnosis not present

## 2023-07-20 DIAGNOSIS — J189 Pneumonia, unspecified organism: Principal | ICD-10-CM

## 2023-07-20 DIAGNOSIS — K219 Gastro-esophageal reflux disease without esophagitis: Secondary | ICD-10-CM | POA: Diagnosis present

## 2023-07-20 DIAGNOSIS — Z794 Long term (current) use of insulin: Secondary | ICD-10-CM

## 2023-07-20 DIAGNOSIS — Z66 Do not resuscitate: Secondary | ICD-10-CM | POA: Diagnosis present

## 2023-07-20 DIAGNOSIS — Z833 Family history of diabetes mellitus: Secondary | ICD-10-CM

## 2023-07-20 DIAGNOSIS — E870 Hyperosmolality and hypernatremia: Secondary | ICD-10-CM | POA: Diagnosis present

## 2023-07-20 DIAGNOSIS — E1165 Type 2 diabetes mellitus with hyperglycemia: Secondary | ICD-10-CM | POA: Diagnosis present

## 2023-07-20 DIAGNOSIS — R000 Tachycardia, unspecified: Secondary | ICD-10-CM | POA: Diagnosis not present

## 2023-07-20 DIAGNOSIS — Z1152 Encounter for screening for COVID-19: Secondary | ICD-10-CM

## 2023-07-20 DIAGNOSIS — N390 Urinary tract infection, site not specified: Secondary | ICD-10-CM | POA: Diagnosis present

## 2023-07-20 DIAGNOSIS — R410 Disorientation, unspecified: Secondary | ICD-10-CM

## 2023-07-20 DIAGNOSIS — N19 Unspecified kidney failure: Secondary | ICD-10-CM

## 2023-07-20 DIAGNOSIS — Z825 Family history of asthma and other chronic lower respiratory diseases: Secondary | ICD-10-CM

## 2023-07-20 DIAGNOSIS — G9340 Encephalopathy, unspecified: Secondary | ICD-10-CM | POA: Diagnosis not present

## 2023-07-20 DIAGNOSIS — R509 Fever, unspecified: Secondary | ICD-10-CM | POA: Diagnosis not present

## 2023-07-20 DIAGNOSIS — Z803 Family history of malignant neoplasm of breast: Secondary | ICD-10-CM

## 2023-07-20 DIAGNOSIS — Z515 Encounter for palliative care: Secondary | ICD-10-CM | POA: Diagnosis not present

## 2023-07-20 DIAGNOSIS — Z823 Family history of stroke: Secondary | ICD-10-CM

## 2023-07-20 DIAGNOSIS — I69354 Hemiplegia and hemiparesis following cerebral infarction affecting left non-dominant side: Secondary | ICD-10-CM | POA: Diagnosis not present

## 2023-07-20 DIAGNOSIS — E860 Dehydration: Secondary | ICD-10-CM | POA: Diagnosis present

## 2023-07-20 DIAGNOSIS — I959 Hypotension, unspecified: Secondary | ICD-10-CM | POA: Diagnosis present

## 2023-07-20 DIAGNOSIS — J9611 Chronic respiratory failure with hypoxia: Secondary | ICD-10-CM | POA: Diagnosis present

## 2023-07-20 DIAGNOSIS — N3000 Acute cystitis without hematuria: Secondary | ICD-10-CM

## 2023-07-20 DIAGNOSIS — R918 Other nonspecific abnormal finding of lung field: Secondary | ICD-10-CM | POA: Diagnosis not present

## 2023-07-20 DIAGNOSIS — E876 Hypokalemia: Secondary | ICD-10-CM | POA: Diagnosis present

## 2023-07-20 DIAGNOSIS — Z8249 Family history of ischemic heart disease and other diseases of the circulatory system: Secondary | ICD-10-CM

## 2023-07-20 DIAGNOSIS — G9341 Metabolic encephalopathy: Secondary | ICD-10-CM | POA: Diagnosis present

## 2023-07-20 DIAGNOSIS — N181 Chronic kidney disease, stage 1: Secondary | ICD-10-CM | POA: Diagnosis present

## 2023-07-20 DIAGNOSIS — I10 Essential (primary) hypertension: Secondary | ICD-10-CM | POA: Diagnosis not present

## 2023-07-20 DIAGNOSIS — Z7401 Bed confinement status: Secondary | ICD-10-CM

## 2023-07-20 DIAGNOSIS — D539 Nutritional anemia, unspecified: Secondary | ICD-10-CM | POA: Diagnosis present

## 2023-07-20 DIAGNOSIS — Z79899 Other long term (current) drug therapy: Secondary | ICD-10-CM

## 2023-07-20 DIAGNOSIS — N179 Acute kidney failure, unspecified: Secondary | ICD-10-CM

## 2023-07-20 DIAGNOSIS — I69291 Dysphagia following other nontraumatic intracranial hemorrhage: Secondary | ICD-10-CM

## 2023-07-20 DIAGNOSIS — E872 Acidosis, unspecified: Secondary | ICD-10-CM | POA: Diagnosis present

## 2023-07-20 DIAGNOSIS — E785 Hyperlipidemia, unspecified: Secondary | ICD-10-CM | POA: Diagnosis present

## 2023-07-20 DIAGNOSIS — J181 Lobar pneumonia, unspecified organism: Secondary | ICD-10-CM | POA: Diagnosis present

## 2023-07-20 DIAGNOSIS — R739 Hyperglycemia, unspecified: Secondary | ICD-10-CM | POA: Diagnosis not present

## 2023-07-20 DIAGNOSIS — R9389 Abnormal findings on diagnostic imaging of other specified body structures: Secondary | ICD-10-CM | POA: Diagnosis not present

## 2023-07-20 DIAGNOSIS — G40909 Epilepsy, unspecified, not intractable, without status epilepticus: Secondary | ICD-10-CM | POA: Diagnosis present

## 2023-07-20 DIAGNOSIS — R6520 Severe sepsis without septic shock: Secondary | ICD-10-CM | POA: Diagnosis present

## 2023-07-20 DIAGNOSIS — I129 Hypertensive chronic kidney disease with stage 1 through stage 4 chronic kidney disease, or unspecified chronic kidney disease: Secondary | ICD-10-CM | POA: Diagnosis present

## 2023-07-20 DIAGNOSIS — I69254 Hemiplegia and hemiparesis following other nontraumatic intracranial hemorrhage affecting left non-dominant side: Secondary | ICD-10-CM

## 2023-07-20 DIAGNOSIS — Z888 Allergy status to other drugs, medicaments and biological substances status: Secondary | ICD-10-CM

## 2023-07-20 DIAGNOSIS — Z91014 Allergy to mammalian meats: Secondary | ICD-10-CM

## 2023-07-20 DIAGNOSIS — Z7189 Other specified counseling: Secondary | ICD-10-CM | POA: Diagnosis not present

## 2023-07-20 DIAGNOSIS — A419 Sepsis, unspecified organism: Secondary | ICD-10-CM | POA: Diagnosis present

## 2023-07-20 MED ORDER — ACETAMINOPHEN 650 MG RE SUPP
650.0000 mg | Freq: Once | RECTAL | Status: AC
  Administered 2023-07-20: 650 mg via RECTAL
  Filled 2023-07-20: qty 1

## 2023-07-20 NOTE — ED Triage Notes (Signed)
Pt from [facility], sent over for "abnormal labs". Pt w/ prior hx of stroke per EMS.

## 2023-07-21 ENCOUNTER — Encounter (HOSPITAL_COMMUNITY): Payer: Self-pay | Admitting: Student

## 2023-07-21 ENCOUNTER — Other Ambulatory Visit: Payer: Self-pay

## 2023-07-21 DIAGNOSIS — Z515 Encounter for palliative care: Secondary | ICD-10-CM | POA: Diagnosis not present

## 2023-07-21 DIAGNOSIS — E870 Hyperosmolality and hypernatremia: Secondary | ICD-10-CM | POA: Diagnosis present

## 2023-07-21 DIAGNOSIS — N3000 Acute cystitis without hematuria: Secondary | ICD-10-CM

## 2023-07-21 DIAGNOSIS — E1165 Type 2 diabetes mellitus with hyperglycemia: Secondary | ICD-10-CM | POA: Diagnosis present

## 2023-07-21 DIAGNOSIS — E872 Acidosis, unspecified: Secondary | ICD-10-CM | POA: Diagnosis present

## 2023-07-21 DIAGNOSIS — Z794 Long term (current) use of insulin: Secondary | ICD-10-CM | POA: Diagnosis not present

## 2023-07-21 DIAGNOSIS — F32A Depression, unspecified: Secondary | ICD-10-CM | POA: Diagnosis present

## 2023-07-21 DIAGNOSIS — N179 Acute kidney failure, unspecified: Secondary | ICD-10-CM

## 2023-07-21 DIAGNOSIS — J189 Pneumonia, unspecified organism: Principal | ICD-10-CM

## 2023-07-21 DIAGNOSIS — K219 Gastro-esophageal reflux disease without esophagitis: Secondary | ICD-10-CM | POA: Diagnosis present

## 2023-07-21 DIAGNOSIS — Z7401 Bed confinement status: Secondary | ICD-10-CM | POA: Diagnosis not present

## 2023-07-21 DIAGNOSIS — E860 Dehydration: Secondary | ICD-10-CM | POA: Diagnosis present

## 2023-07-21 DIAGNOSIS — G9340 Encephalopathy, unspecified: Secondary | ICD-10-CM | POA: Diagnosis not present

## 2023-07-21 DIAGNOSIS — E1122 Type 2 diabetes mellitus with diabetic chronic kidney disease: Secondary | ICD-10-CM | POA: Diagnosis present

## 2023-07-21 DIAGNOSIS — N390 Urinary tract infection, site not specified: Secondary | ICD-10-CM | POA: Diagnosis present

## 2023-07-21 DIAGNOSIS — Z66 Do not resuscitate: Secondary | ICD-10-CM | POA: Diagnosis present

## 2023-07-21 DIAGNOSIS — A419 Sepsis, unspecified organism: Secondary | ICD-10-CM

## 2023-07-21 DIAGNOSIS — E785 Hyperlipidemia, unspecified: Secondary | ICD-10-CM | POA: Diagnosis present

## 2023-07-21 DIAGNOSIS — E876 Hypokalemia: Secondary | ICD-10-CM | POA: Diagnosis present

## 2023-07-21 DIAGNOSIS — I69254 Hemiplegia and hemiparesis following other nontraumatic intracranial hemorrhage affecting left non-dominant side: Secondary | ICD-10-CM | POA: Diagnosis not present

## 2023-07-21 DIAGNOSIS — R6520 Severe sepsis without septic shock: Secondary | ICD-10-CM | POA: Diagnosis present

## 2023-07-21 DIAGNOSIS — Z1152 Encounter for screening for COVID-19: Secondary | ICD-10-CM | POA: Diagnosis not present

## 2023-07-21 DIAGNOSIS — J9611 Chronic respiratory failure with hypoxia: Secondary | ICD-10-CM | POA: Diagnosis present

## 2023-07-21 DIAGNOSIS — R410 Disorientation, unspecified: Secondary | ICD-10-CM | POA: Diagnosis present

## 2023-07-21 DIAGNOSIS — N19 Unspecified kidney failure: Secondary | ICD-10-CM

## 2023-07-21 DIAGNOSIS — G40909 Epilepsy, unspecified, not intractable, without status epilepticus: Secondary | ICD-10-CM | POA: Diagnosis present

## 2023-07-21 DIAGNOSIS — D631 Anemia in chronic kidney disease: Secondary | ICD-10-CM | POA: Diagnosis present

## 2023-07-21 DIAGNOSIS — Z7189 Other specified counseling: Secondary | ICD-10-CM | POA: Diagnosis not present

## 2023-07-21 DIAGNOSIS — J181 Lobar pneumonia, unspecified organism: Secondary | ICD-10-CM | POA: Diagnosis present

## 2023-07-21 DIAGNOSIS — D509 Iron deficiency anemia, unspecified: Secondary | ICD-10-CM | POA: Diagnosis present

## 2023-07-21 DIAGNOSIS — I129 Hypertensive chronic kidney disease with stage 1 through stage 4 chronic kidney disease, or unspecified chronic kidney disease: Secondary | ICD-10-CM | POA: Diagnosis present

## 2023-07-21 DIAGNOSIS — I10 Essential (primary) hypertension: Secondary | ICD-10-CM | POA: Diagnosis not present

## 2023-07-21 DIAGNOSIS — G9341 Metabolic encephalopathy: Secondary | ICD-10-CM | POA: Diagnosis present

## 2023-07-21 LAB — URINALYSIS, W/ REFLEX TO CULTURE (INFECTION SUSPECTED)
Bilirubin Urine: NEGATIVE
Glucose, UA: NEGATIVE mg/dL
Hgb urine dipstick: NEGATIVE
Ketones, ur: NEGATIVE mg/dL
Nitrite: NEGATIVE
Protein, ur: 100 mg/dL — AB
Specific Gravity, Urine: 1.021 (ref 1.005–1.030)
WBC, UA: >50 WBC/hpf (ref 0–5)
pH: 7 (ref 5.0–8.0)

## 2023-07-21 LAB — BASIC METABOLIC PANEL
Anion gap: 14 (ref 5–15)
Anion gap: 13 (ref 5–15)
BUN: 114 mg/dL — ABNORMAL HIGH (ref 8–23)
BUN: 112 mg/dL — ABNORMAL HIGH (ref 8–23)
CO2: 25 mmol/L (ref 22–32)
CO2: 27 mmol/L (ref 22–32)
Calcium: 8.5 mg/dL — ABNORMAL LOW (ref 8.9–10.3)
Calcium: 8.3 mg/dL — ABNORMAL LOW (ref 8.9–10.3)
Chloride: 114 mmol/L — ABNORMAL HIGH (ref 98–111)
Chloride: 116 mmol/L — ABNORMAL HIGH (ref 98–111)
Creatinine, Ser: 1.67 mg/dL — ABNORMAL HIGH (ref 0.44–1.00)
Creatinine, Ser: 1.7 mg/dL — ABNORMAL HIGH (ref 0.44–1.00)
GFR, Estimated: 30 mL/min — ABNORMAL LOW (ref >60)
GFR, Estimated: 29 mL/min — ABNORMAL LOW (ref >60)
Glucose, Bld: 170 mg/dL — ABNORMAL HIGH (ref 70–99)
Glucose, Bld: 211 mg/dL — ABNORMAL HIGH (ref 70–99)
Potassium: 3.5 mmol/L (ref 3.5–5.1)
Potassium: 3.8 mmol/L (ref 3.5–5.1)
Sodium: 153 mmol/L — ABNORMAL HIGH (ref 135–145)
Sodium: 156 mmol/L — ABNORMAL HIGH (ref 135–145)

## 2023-07-21 LAB — COMPREHENSIVE METABOLIC PANEL
ALT: 86 U/L — ABNORMAL HIGH (ref 0–44)
AST: 100 U/L — ABNORMAL HIGH (ref 15–41)
Albumin: 1.9 g/dL — ABNORMAL LOW (ref 3.5–5.0)
Alkaline Phosphatase: 79 U/L (ref 38–126)
Anion gap: 13 (ref 5–15)
BUN: 120 mg/dL — ABNORMAL HIGH (ref 8–23)
CO2: 29 mmol/L (ref 22–32)
Calcium: 9.2 mg/dL (ref 8.9–10.3)
Chloride: 111 mmol/L (ref 98–111)
Creatinine, Ser: 1.75 mg/dL — ABNORMAL HIGH (ref 0.44–1.00)
GFR, Estimated: 28 mL/min — ABNORMAL LOW (ref >60)
Glucose, Bld: 195 mg/dL — ABNORMAL HIGH (ref 70–99)
Potassium: 4.4 mmol/L (ref 3.5–5.1)
Sodium: 153 mmol/L — ABNORMAL HIGH (ref 135–145)
Total Bilirubin: 0.8 mg/dL (ref 0.0–1.2)
Total Protein: 6.9 g/dL (ref 6.5–8.1)

## 2023-07-21 LAB — GLUCOSE, CAPILLARY
Glucose-Capillary: 310 mg/dL — ABNORMAL HIGH (ref 70–99)
Glucose-Capillary: 331 mg/dL — ABNORMAL HIGH (ref 70–99)
Glucose-Capillary: 336 mg/dL — ABNORMAL HIGH (ref 70–99)

## 2023-07-21 LAB — BLOOD CULTURE ID PANEL (REFLEXED) - BCID2

## 2023-07-21 LAB — CBC WITH DIFFERENTIAL/PLATELET
Abs Immature Granulocytes: 0.19 10*3/uL — ABNORMAL HIGH (ref 0.00–0.07)
Basophils Absolute: 0 10*3/uL (ref 0.0–0.1)
Basophils Relative: 0 %
Eosinophils Absolute: 0 10*3/uL (ref 0.0–0.5)
Eosinophils Relative: 0 %
HCT: 31.8 % — ABNORMAL LOW (ref 36.0–46.0)
Hemoglobin: 10 g/dL — ABNORMAL LOW (ref 12.0–15.0)
Immature Granulocytes: 1 %
Lymphocytes Relative: 5 %
Lymphs Abs: 1.2 10*3/uL (ref 0.7–4.0)
MCH: 30.9 pg (ref 26.0–34.0)
MCHC: 31.4 g/dL (ref 30.0–36.0)
MCV: 98.1 fL (ref 80.0–100.0)
Monocytes Absolute: 1.9 10*3/uL — ABNORMAL HIGH (ref 0.1–1.0)
Monocytes Relative: 8 %
Neutro Abs: 19.9 10*3/uL — ABNORMAL HIGH (ref 1.7–7.7)
Neutrophils Relative %: 86 %
Platelets: 185 10*3/uL (ref 150–400)
RBC: 3.24 MIL/uL — ABNORMAL LOW (ref 3.87–5.11)
RDW: 19.6 % — ABNORMAL HIGH (ref 11.5–15.5)
WBC: 23.2 10*3/uL — ABNORMAL HIGH (ref 4.0–10.5)
nRBC: 0.2 % (ref 0.0–0.2)

## 2023-07-21 LAB — CBG MONITORING, ED
Glucose-Capillary: 184 mg/dL — ABNORMAL HIGH (ref 70–99)
Glucose-Capillary: 194 mg/dL — ABNORMAL HIGH (ref 70–99)
Glucose-Capillary: 292 mg/dL — ABNORMAL HIGH (ref 70–99)
Glucose-Capillary: 332 mg/dL — ABNORMAL HIGH (ref 70–99)

## 2023-07-21 LAB — SODIUM
Sodium: 150 mmol/L — ABNORMAL HIGH (ref 135–145)
Sodium: 150 mmol/L — ABNORMAL HIGH (ref 135–145)
Sodium: 147 mmol/L — ABNORMAL HIGH (ref 135–145)
Sodium: 146 mmol/L — ABNORMAL HIGH (ref 135–145)
Sodium: 142 mmol/L (ref 135–145)
Sodium: 142 mmol/L (ref 135–145)
Sodium: 145 mmol/L (ref 135–145)
Sodium: 142 mmol/L (ref 135–145)

## 2023-07-21 LAB — RESP PANEL BY RT-PCR (RSV, FLU A&B, COVID)  RVPGX2
Influenza A by PCR: NEGATIVE
Influenza B by PCR: NEGATIVE
Resp Syncytial Virus by PCR: NEGATIVE
SARS Coronavirus 2 by RT PCR: NEGATIVE

## 2023-07-21 LAB — LACTIC ACID, PLASMA
Lactic Acid, Venous: 3.1 mmol/L (ref 0.5–1.9)
Lactic Acid, Venous: 2.3 mmol/L (ref 0.5–1.9)

## 2023-07-21 LAB — CBC
HCT: 28.2 % — ABNORMAL LOW (ref 36.0–46.0)
Hemoglobin: 8.1 g/dL — ABNORMAL LOW (ref 12.0–15.0)
MCH: 29.5 pg (ref 26.0–34.0)
MCHC: 28.7 g/dL — ABNORMAL LOW (ref 30.0–36.0)
MCV: 102.5 fL — ABNORMAL HIGH (ref 80.0–100.0)
Platelets: 134 10*3/uL — ABNORMAL LOW (ref 150–400)
RBC: 2.75 MIL/uL — ABNORMAL LOW (ref 3.87–5.11)
RDW: 18.6 % — ABNORMAL HIGH (ref 11.5–15.5)
WBC: 20.1 10*3/uL — ABNORMAL HIGH (ref 4.0–10.5)
nRBC: 0 % (ref 0.0–0.2)

## 2023-07-21 LAB — I-STAT CG4 LACTIC ACID, ED
Lactic Acid, Venous: 4 mmol/L (ref 0.5–1.9)
Lactic Acid, Venous: 4.9 mmol/L (ref 0.5–1.9)

## 2023-07-21 LAB — PROCALCITONIN: Procalcitonin: 0.82 ng/mL

## 2023-07-21 LAB — TSH: TSH: 1.134 u[IU]/mL (ref 0.350–4.500)

## 2023-07-21 MED ORDER — ACETAMINOPHEN 325 MG PO TABS
650.0000 mg | ORAL_TABLET | Freq: Four times a day (QID) | ORAL | Status: DC | PRN
  Administered 2023-07-22: 650 mg via ORAL
  Filled 2023-07-21: qty 2

## 2023-07-21 MED ORDER — INSULIN ASPART 100 UNIT/ML IJ SOLN
0.0000 [IU] | INTRAMUSCULAR | Status: DC
  Administered 2023-07-21: 2 [IU] via SUBCUTANEOUS
  Administered 2023-07-21 (×2): 7 [IU] via SUBCUTANEOUS
  Administered 2023-07-21: 5 [IU] via SUBCUTANEOUS
  Administered 2023-07-21: 7 [IU] via SUBCUTANEOUS
  Administered 2023-07-22: 2 [IU] via SUBCUTANEOUS
  Administered 2023-07-22: 3 [IU] via SUBCUTANEOUS
  Administered 2023-07-22 – 2023-07-23 (×5): 2 [IU] via SUBCUTANEOUS
  Administered 2023-07-23: 3 [IU] via SUBCUTANEOUS
  Administered 2023-07-24: 5 [IU] via SUBCUTANEOUS
  Administered 2023-07-24: 7 [IU] via SUBCUTANEOUS
  Administered 2023-07-24: 3 [IU] via SUBCUTANEOUS
  Administered 2023-07-24: 2 [IU] via SUBCUTANEOUS
  Administered 2023-07-24: 3 [IU] via SUBCUTANEOUS
  Administered 2023-07-25 (×2): 2 [IU] via SUBCUTANEOUS
  Administered 2023-07-25: 3 [IU] via SUBCUTANEOUS
  Administered 2023-07-26: 2 [IU] via SUBCUTANEOUS
  Administered 2023-07-26: 1 [IU] via SUBCUTANEOUS
  Administered 2023-07-26: 2 [IU] via SUBCUTANEOUS
  Administered 2023-07-26: 1 [IU] via SUBCUTANEOUS
  Administered 2023-07-26: 2 [IU] via SUBCUTANEOUS
  Administered 2023-07-26: 1 [IU] via SUBCUTANEOUS
  Administered 2023-07-27 (×4): 2 [IU] via SUBCUTANEOUS

## 2023-07-21 MED ORDER — SENNOSIDES-DOCUSATE SODIUM 8.6-50 MG PO TABS: 1.0000 | ORAL_TABLET | Freq: Every evening | ORAL | Status: DC | PRN

## 2023-07-21 MED ORDER — RIVAROXABAN 10 MG PO TABS
10.0000 mg | ORAL_TABLET | Freq: Every day | ORAL | Status: DC
  Administered 2023-07-23 – 2023-07-27 (×5): 10 mg via ORAL
  Filled 2023-07-21 (×5): qty 1

## 2023-07-21 MED ORDER — DEXTROSE 5 % IV SOLN: INTRAVENOUS | Status: AC

## 2023-07-21 MED ORDER — ONDANSETRON HCL 4 MG/2ML IJ SOLN: 4.0000 mg | Freq: Four times a day (QID) | INTRAMUSCULAR | Status: DC | PRN

## 2023-07-21 MED ORDER — SODIUM CHLORIDE 0.9 % IV SOLN
1.0000 g | INTRAVENOUS | Status: DC
  Administered 2023-07-22 – 2023-07-27 (×6): 1 g via INTRAVENOUS
  Filled 2023-07-21 (×6): qty 10

## 2023-07-21 MED ORDER — SODIUM CHLORIDE 0.9 % IV BOLUS (SEPSIS)
500.0000 mL | Freq: Once | INTRAVENOUS | Status: AC
  Administered 2023-07-21: 500 mL via INTRAVENOUS

## 2023-07-21 MED ORDER — CEFTRIAXONE SODIUM 1 G IJ SOLR
1.0000 g | Freq: Once | INTRAMUSCULAR | Status: AC
  Administered 2023-07-21: 1 g via INTRAVENOUS
  Filled 2023-07-21: qty 10

## 2023-07-21 MED ORDER — SODIUM CHLORIDE 0.9 % IV BOLUS
500.0000 mL | Freq: Once | INTRAVENOUS | Status: AC
  Administered 2023-07-21: 500 mL via INTRAVENOUS

## 2023-07-21 MED ORDER — ACETAMINOPHEN 650 MG RE SUPP: 650.0000 mg | Freq: Four times a day (QID) | RECTAL | Status: DC | PRN

## 2023-07-21 MED ORDER — ONDANSETRON HCL 4 MG PO TABS: 4.0000 mg | ORAL_TABLET | Freq: Four times a day (QID) | ORAL | Status: DC | PRN

## 2023-07-21 MED ORDER — SODIUM CHLORIDE 0.9 % IV SOLN
500.0000 mg | Freq: Once | INTRAVENOUS | Status: AC
  Administered 2023-07-21: 500 mg via INTRAVENOUS
  Filled 2023-07-21: qty 5

## 2023-07-21 MED ORDER — SODIUM CHLORIDE 0.9 % IV SOLN
500.0000 mg | INTRAVENOUS | Status: AC
  Administered 2023-07-22 – 2023-07-25 (×4): 500 mg via INTRAVENOUS
  Filled 2023-07-21 (×4): qty 5

## 2023-07-21 NOTE — Care Plan (Addendum)
This 88 years old female with PMH significant for PUD, Iron deficiency anemia, subdural hematoma with residual left-sided hemiparesis and dysarthria, severe dysphagia with PEG tube dependency, Chronic hypoxic respiratory failure on 4 L of supplemental oxygen and history of GI bleed presented from skilled nursing facility due to abnormal labs.  History is obtained from the daughter over the phone.  As per [name] staff that the patient has been more confused recently.  Patient has not been responding much, mostly sleeping and not opening her eyes.  At baseline patient is not conversational but opens her eyes and able to track and occasionally moves her right leg.  Chest x-ray shows finding consistent with acute lobar pneumonia.  Patient admitted for severe sepsis secondary to community-acquired pneumonia.  Patient was seen at bedside, continues to remain lethargic.  She moans on painful stimuli.  Continued on IV antibiotics ceftriaxone and Zithromax.,  Continued on free water with D5W for hypernatremia , Serum sodium is improving.

## 2023-07-21 NOTE — ED Notes (Signed)
 of normal saline bolus started.

## 2023-07-21 NOTE — ED Provider Notes (Signed)
[facility] AT [facility] Provider Note   CSN: [id number] Arrival date & time: 07/20/23  2308     History  Chief Complaint  Patient presents with   Abnormal Labs   Level 5 caveat due to acuity of condition Gina Kelley is a 88 y.o. female.  The history is provided by the nursing home and a relative.  Patient with history of diabetes, hypertension, CVA, dysphagia presents for altered mental status.  Patient sent from local nursing facility for "abnormal labs" patient is unable to provide any history.  Per records patient had new leukocytosis of around 18. No other details are known on arrival    Past Medical History:  Diagnosis Date   Anemia of chronic disease 07/08/2006   Antral ulcer 02/23/2007   Seen on EGD in 2008, small ulcer with erosion    Cerebral vascular accident (HCC) 04/14/2006   1998, left lower extremity numbness, no residual deficits    Essential hypertension 04/14/2006   Gastroesophageal reflux disease 02/18/2013   Occasional, symptomatically relieved with peptobismol    Healthcare maintenance 12/04/2011   Hyperlipidemia LDL goal < 100 04/14/2006   Hypertensive retinopathy of both eyes, grade 1 05/15/2016   Osteopenia of right femoral neck 10/20/2016   DEXA (10/16/2016): R femur T -2.5 (FRAX tool calculates at -2.4), L1-L4 spine T -0.9, 10 year risk for: Major osteoporotic fracture 8.3%, Hip fracture 2.7%   Seizure disorder (HCC) 03/01/2023   Type 2 diabetes mellitus with stage 1 chronic kidney disease, without long-term current use of insulin (HCC)    Type II diabetes mellitus (HCC) 04/14/2006    Home Medications Prior to Admission medications   Medication Sig Start Date End Date Taking? Authorizing Provider  acetaminophen (TYLENOL) 500 MG tablet Place 1,000 mg into feeding tube in the morning and at bedtime.    [provider]  acetaminophen (TYLENOL) 500 MG tablet Place 500 mg into feeding tube every 6 (six)  hours as needed for mild pain (pain score 1-3).    [provider]  Amino Acids-Protein Hydrolys (PRO-STAT) LIQD Place 60 mLs into feeding tube daily.    [provider]  carvedilol (COREG) 6.25 MG tablet Place 1 tablet (6.25 mg total) into feeding tube 2 (two) times daily with a meal. 08/22/22   Setzer, Lynnell Jude, PA-C  ezetimibe (ZETIA) 10 MG tablet Place 1 tablet (10 mg total) into feeding tube daily. Patient taking differently: Place 2,100 mg into feeding tube at bedtime. 08/23/22   Setzer, Lynnell Jude, PA-C  famotidine (PEPCID) 20 MG tablet Place 1 tablet (20 mg total) into feeding tube daily at 4 PM. 04/30/23   Morene Crocker, MD  Ferrous Sulfate (ONE VITE FERROUS SULFATE) 300 MG/6.8ML SOLN Give 9.27 mLs by tube daily.    [provider]  FLUoxetine (PROZAC) 20 MG capsule Place 1 capsule (20 mg total) into feeding tube at bedtime. 08/22/22   Setzer, Lynnell Jude, PA-C  gabapentin (NEURONTIN) 250 MG/5ML solution Place 2 mLs (100 mg total) into feeding tube every 8 (eight) hours. 08/22/22   Setzer, Lynnell Jude, PA-C  guaiFENesin (ROBITUSSIN) 100 MG/5ML liquid Place 100 mg into feeding tube 4 (four) times daily.    [provider]  insulin lispro (HUMALOG) 100 UNIT/ML KwikPen Inject 0-16 Units into the skin See admin instructions. Inject 0-16 units 4 times daily per sliding scale: 70-150 : 0 units 151-200 : 3 units 201-250 : 6 units 251-300 : 9 units 301-350 : 12 units 351-400 :  16 units > 401 : notify MD    [provider]  levETIRAcetam (KEPPRA) 100 MG/ML solution Place 5 mLs (500 mg total) into feeding tube 2 (two) times daily. 01/22/23   Micki Riley, MD  Multiple Vitamins-Minerals (PRESERVISION AREDS 2) CHEW Give 1 each by tube 2 (two) times daily.    [provider]  Nutritional Supplements (FEEDING SUPPLEMENT, GLUCERNA 1.5 CAL,) LIQD Place 40 mL/hr into feeding tube daily. Patient taking differently: Place 45 mL/hr into feeding tube 3  (three) times daily. Place 45ml into feeding tube continuouslt 04/30/23   Morene Crocker, MD  OXYGEN Inhale 4 L/min into the lungs continuous.    [provider]  Protein (FEEDING SUPPLEMENT, PROSOURCE TF20,) liquid Place 60 mLs into feeding tube daily. Patient not taking: Reported on 06/13/2023 04/30/23   Morene Crocker, MD  Water For Irrigation, Sterile (FREE WATER) SOLN Place 120 mLs into feeding tube every 4 (four) hours.    [provider]  Water For Irrigation, Sterile (STERILE WATER FOR IRRIGATION IR) Place 30 mLs into feeding tube in the morning, at noon, and at bedtime. Flush tube with 30ml of water before and after giving medications    [provider]      Allergies    Cozaar [losartan], Periogard [chlorhexidine gluconate], Pork allergy, and Vasotec [enalapril]    Review of Systems   Review of Systems  Unable to perform ROS: Acuity of condition    Physical Exam Updated Vital Signs BP 107/63   Pulse (!) 108   Temp (!) 100.8 F (38.2 C) (Rectal)   Resp 17   Ht 1.626 m (5\' 4" )   Wt 54 kg   SpO2 96%   BMI 20.43 kg/m  Physical Exam CONSTITUTIONAL: Elderly and somnolent HEAD: Normocephalic/atraumatic, no visible trauma ENMT: Mucous membranes moist CV: S1/S2 noted, no murmurs/rubs/gallops noted LUNGS: Crackles in the right base ABDOMEN: soft, nontender, PEG tube in place GU: Wearing a diaper NEURO: Pt is somnolent but arousable to voice EXTREMITIES: pulses normal/equal, no obvious deformities SKIN: warm, color normal PSYCH: Unable to assess  ED Results / Procedures / Treatments   Labs (all labs ordered are listed, but only abnormal results are displayed) Labs Reviewed  COMPREHENSIVE METABOLIC PANEL - Abnormal; Notable for the following components:      Result Value   Sodium 153 (*)    Glucose, Bld 195 (*)    BUN 120 (*)    Creatinine, Ser 1.75 (*)    Albumin 1.9 (*)    AST 100 (*)    ALT 86 (*)    GFR, Estimated 28  (*)    All other components within normal limits  CBC WITH DIFFERENTIAL/PLATELET - Abnormal; Notable for the following components:   WBC 23.2 (*)    RBC 3.24 (*)    Hemoglobin 10.0 (*)    HCT 31.8 (*)    RDW 19.6 (*)    Neutro Abs 19.9 (*)    Monocytes Absolute 1.9 (*)    Abs Immature Granulocytes 0.19 (*)    All other components within normal limits  URINALYSIS, W/ REFLEX TO CULTURE (INFECTION SUSPECTED) - Abnormal; Notable for the following components:   Color, Urine AMBER (*)    APPearance CLOUDY (*)    Protein, ur 100 (*)    Leukocytes,Ua MODERATE (*)    Bacteria, UA FEW (*)    All other components within normal limits  I-STAT CG4 LACTIC ACID, ED - Abnormal; Notable for the following components:  Lactic Acid, Venous 4.9 (*)    All other components within normal limits  CBG MONITORING, ED - Abnormal; Notable for the following components:   Glucose-Capillary 184 (*)    All other components within normal limits  RESP PANEL BY RT-PCR (RSV, FLU A&B, COVID)  RVPGX2  CULTURE, BLOOD (ROUTINE X 2)  CULTURE, BLOOD (ROUTINE X 2)  I-STAT CG4 LACTIC ACID, ED    EKG EKG Interpretation Date/Time:  Tuesday July 21 2023 00:21:24 EST Ventricular Rate:  108 PR Interval:  166 QRS Duration:  72 QT Interval:  332 QTC Calculation: 445 R Axis:   25  Text Interpretation: Sinus tachycardia Borderline T wave abnormalities Confirmed by Zadie Rhine ([id number]) on 07/21/2023 12:24:18 AM  Radiology DG Chest Port 1 View Result Date: 07/20/2023 CLINICAL DATA:  Sepsis EXAM: PORTABLE CHEST 1 VIEW COMPARISON:  03/10/2023 FINDINGS: New elevation of the right hemidiaphragm. There is superimposed right basilar consolidation with air bronchograms identified suspicious for changes of acute lobar pneumonia in the appropriate clinical setting. Left lung is clear. No pneumothorax or pleural effusion. Cardiac size within normal limits. Pulmonary vascularity is normal. IMPRESSION: 1. New elevation of the  right hemidiaphragm with superimposed right basilar consolidation suspicious for changes of acute lobar pneumonia in the appropriate clinical setting. Electronically Signed   By: Helyn Numbers M.D.   On: 07/20/2023 23:37    Procedures .Critical Care  Performed by: Zadie Rhine, MD Authorized by: Zadie Rhine, MD   Critical care provider statement:    Critical care time (minutes):  60   Critical care start time:  07/20/2023 11:30 PM   Critical care end time:  07/21/2023 12:30 AM   Critical care time was exclusive of:  Separately billable procedures and treating other patients   Critical care was necessary to treat or prevent imminent or life-threatening deterioration of the following conditions:  Sepsis, CNS failure or compromise, metabolic crisis, dehydration and renal failure   Critical care was time spent personally by me on the following activities:  Examination of patient, development of treatment plan with patient or surrogate, pulse oximetry, ordering and review of radiographic studies, ordering and review of laboratory studies, re-evaluation of patient's condition, ordering and performing treatments and interventions, obtaining history from patient or surrogate and evaluation of patient's response to treatment   I assumed direction of critical care for this patient from another provider in my specialty: no     Care discussed with: admitting provider       Medications Ordered in ED Medications  azithromycin (ZITHROMAX) 500 mg in sodium chloride 0.9 % 250 mL IVPB (500 mg Intravenous New Bag/Given 07/21/23 0050)  acetaminophen (TYLENOL) suppository 650 mg (650 mg Rectal Given 07/20/23 2359)  cefTRIAXone (ROCEPHIN) 1 g in sodium chloride 0.9 % 100 mL IVPB (0 g Intravenous Stopped 07/21/23 0119)  sodium chloride 0.9 % bolus 500 mL (500 mLs Intravenous New Bag/Given 07/21/23 0046)    ED Course/ Medical Decision Making/ A&P Clinical Course as of 07/21/23 0129  Mon Jul 20, 2023  2331  Discussed with staff at [facility] rehab They report patient has had more confusion and elevated labs likely leukocytosis.  Nurse was unaware patient had fever.  Nurse confirms the patient is a DNR and she has a DNR at bedside dated from December 2024 [DW]  Tue Jul 21, 2023  0016 Sodium(!): 153 Hypernatremia [DW]  0016 Creatinine(!): 1.75 Acute kidney injury [DW]  0038 Patient with acute kidney injury, hypernatremia as well as likely underlying sepsis  from pneumonia.  IV antibiotics been ordered.  Will admit to the hospitalist [DW]  0126 Patient resting comfortably, but mental status is minimally changed.  She is protecting her airway.  I suspect her delirium is a combination of underlying infectious process/pneumonia as well as dehydration and hypernatremia [DW]  0127 Discussed with Dr. Kirke Corin for admission  [DW]  0128 Although her lactate is elevated, she has significant hypernatremia that we will likely need to be treated with fluids other than normal saline and lactated Ringer's Will defer full 6ml/kg bolus for now [DW]    Clinical Course User Index [DW] Zadie Rhine, MD                                 Medical Decision Making Amount and/or Complexity of Data Reviewed Labs: ordered. Decision-making details documented in ED Course. Radiology: ordered.  Risk OTC drugs. Decision regarding hospitalization.   This patient presents to the ED for concern of weakness., this involves an extensive number of treatment options, and is a complaint that carries with it a high risk of complications and morbidity.  The differential diagnosis includes but is not limited to CVA, intracranial hemorrhage, acute coronary syndrome, renal failure, urinary tract infection, electrolyte disturbance, pneumonia   Comorbidities that complicate the patient evaluation: Patient's presentation is complicated by their history of dysphagia, CVA  Social Determinants of Health: Patient's  mental status  change   increases the complexity of managing their presentation  Additional history obtained: Additional history obtained from family nursing home Records reviewed previous admission documents  Lab Tests: I Ordered, and personally interpreted labs.  The pertinent results include: Acute kidney injury, hypernatremia  Imaging Studies ordered: I ordered imaging studies including X-ray chest   I independently visualized and interpreted imaging which showed pneumonia I agree with the radiologist interpretation  Cardiac Monitoring: The patient was maintained on a cardiac monitor.  I personally viewed and interpreted the cardiac monitor which showed an underlying rhythm of:  sinus tachycardia  Medicines ordered and prescription drug management: I ordered medication including IV antibiotics for pneumonia Reevaluation of the patient after these medicines showed that the patient    stayed the same  Critical Interventions:   IV fluids and antibiotics  Consultations Obtained: I requested consultation with the admitting physician [facility] , and discussed  findings as well as pertinent plan - they recommend: Admit  Reevaluation: After the interventions noted above, I reevaluated the patient and found that they have :stayed the same  Complexity of problems addressed: Patient's presentation is most consistent with  acute presentation with potential threat to life or bodily function  Disposition: After consideration of the diagnostic results and the patient's response to treatment,  I feel that the patent would benefit from admission   .           Final Clinical Impression(s) / ED Diagnoses Final diagnoses:  Community acquired pneumonia of right lower lobe of lung  AKI (acute kidney injury) (HCC)  Delirium  Hypernatremia    Rx / DC Orders ED Discharge Orders     None         Zadie Rhine, MD 07/21/23 0130

## 2023-07-21 NOTE — ED Notes (Signed)
Contacted attending Pardeep MD regarding patient's low blood pressure.

## 2023-07-21 NOTE — Progress Notes (Signed)
NEW ADMISSION NOTE New Admission Note:   Arrival Method: Patient arrived from ED Mental Orientation: NOnverbal Telemetry: on Assessment: Completed Skin: warm, dry IV: R hand and L FA SL Pain: faces, no pain Tubes: purwick Safety Measures: Safety Fall Prevention Plan has been given, discussed and signed Admission: Completed [facility] Orientation: Patient is nonverbal and lethargic.  Family: NOne  Orders have been reviewed and implemented. Will continue to monitor the patient. Call light has been placed within reach and bed alarm has been activated.   Arvilla Meres, RN

## 2023-07-21 NOTE — ED Notes (Addendum)
Attending Pardeep MD gave verbal orders to give patient a bolus of of normal saline and to hold blood pressure medications.

## 2023-07-21 NOTE — ED Notes (Signed)
Critical lactic called 2.3 and Dr Idelle Leech was notified ar 10:45.

## 2023-07-21 NOTE — H&P (Addendum)
History and Physical  Gina Kelley EXB:[id number] DOB: 12-11-1935 DOA: 07/20/2023  PCP: System, Provider Not In   Chief Complaint: Abnormal labs  HPI: Gina Kelley is a 87 y.o. female with medical history significant for PUD, iron deficiency anemia, subdural hematoma with residual left hemiparesis and dysarthria, severe dysphagia with PEG dependency, chronic hypoxic respiratory failure on 4 L [place], and GI bleed who presented from her [name] due to abnormal labs. Patient unable to provide any history. History obtained from daughter on the phone.  Staff at patient's [name] report that patient has had more confusion recently.  According to her daughter, on Saturday, pt was not responding much, mostly sleeping and not opening her eyes. On Monday, she continued to be altered even while they were giving her a bath. Her blood sugar was almost 300 so they gave her insulin. She continued to be unresponsive so they obtained vitals and her SBP was in the 90s. Per daughter, at baseline, pt not conversational, but her eyes are usually open, she is able to track and occasionally moves her right leg. She was not doing any of this today so they called EMS.  She had lab work done on 1/20 and the results was pertinent for sodium of 157, chloride 114, CO2 35, BUN/creatinine 97/1.41, AST/ALT 70/83, BUN 2.7, WBC 18.8, Hgb 10.4, platelet 161. She was sent to the ED for further evaluation and management of her abnormal labs.  ED Course:  Initial vitals with temp 100.8, RR 25, HR 107, BP 98/67, SpO2 97% on 2 L [place].  Labs show sodium 154, K+ 4.4, glucose 195, BUN/creatinine 120/1.75, AST/ALT 100/86, WBC 23.2, Hgb 10.0, platelet 185, lactic acid 4.9, negative flu, RSV and COVID test, UA shows proteinuria, negative nitrite, moderate leuks, RBC 0-5, WBC >50, and few bacteria EKG shows sinus tach Chest x-ray shows right basilar consolidation suspicious for acute lobar pneumonia Patient was given IV Rocephin, IV azithromycin,  Tylenol suppository 500 mg and 500 cc of IV NS bolus TRH was consulted for admission  Review of Systems: Please see HPI for pertinent positives and negatives. A complete 10 system review of systems unable to be performed due to patient's mental status  Past Medical History:  Diagnosis Date   Anemia of chronic disease 07/08/2006   Antral ulcer 02/23/2007   Seen on EGD in 2008, small ulcer with erosion    Cerebral vascular accident (HCC) 04/14/2006   1998, left lower extremity numbness, no residual deficits    Essential hypertension 04/14/2006   Gastroesophageal reflux disease 02/18/2013   Occasional, symptomatically relieved with peptobismol    Healthcare maintenance 12/04/2011   Hyperlipidemia LDL goal < 100 04/14/2006   Hypertensive retinopathy of both eyes, grade 1 05/15/2016   Osteopenia of right femoral neck 10/20/2016   DEXA (10/16/2016): R femur T -2.5 (FRAX tool calculates at -2.4), L1-L4 spine T -0.9, 10 year risk for: Major osteoporotic fracture 8.3%, Hip fracture 2.7%   Seizure disorder (HCC) 03/01/2023   Type 2 diabetes mellitus with stage 1 chronic kidney disease, without long-term current use of insulin (HCC)    Type II diabetes mellitus (HCC) 04/14/2006   Past Surgical History:  Procedure Laterality Date   COLONOSCOPY     CRANIOTOMY Right 07/08/2022   Procedure: RIGHT CRANIOTOMY HEMATOMA EVACUATION SUBDURAL;  Surgeon: Julio Sicks, MD;  Location: [facility] OR;  Service: Neurosurgery;  Laterality: Right;   ESOPHAGOGASTRODUODENOSCOPY     ESOPHAGOGASTRODUODENOSCOPY (EGD) WITH PROPOFOL N/A 08/01/2022   Procedure: ESOPHAGOGASTRODUODENOSCOPY (EGD) WITH PROPOFOL;  Surgeon: Diamantina Monks, MD;  Location: [facility] ENDOSCOPY;  Service: General;  Laterality: N/A;   PEG PLACEMENT N/A 08/01/2022   Procedure: PERCUTANEOUS ENDOSCOPIC GASTROSTOMY (PEG) PLACEMENT;  Surgeon: Diamantina Monks, MD;  Location: [facility] ENDOSCOPY;  Service: General;  Laterality: N/A;   Social History:  reports that she quit smoking  about 45 years ago. Her smoking use included cigarettes. She has never used smokeless tobacco. She reports that she does not drink alcohol and does not use drugs.  Allergies  Allergen Reactions   Cozaar [Losartan] Swelling    Angioedema    Periogard [Chlorhexidine Gluconate] Swelling and Other (See Comments)    Skin irritation Eye/tongue/lip swelling   Pork Allergy Other (See Comments)    Unknown reaction   Vasotec [Enalapril] Cough    Family History  Problem Relation Age of Onset   Stroke Mother    Diabetes Mother    Hypertension Mother    Anuerysm Father 51       Cerebral   Asthma Sister    Breast cancer Sister    Pulmonary disease Brother        Black lung   Stroke Sister    Cirrhosis Brother    Hypertension Son    Asthma Brother      Prior to Admission medications   Medication Sig Start Date End Date Taking? Authorizing Provider  acetaminophen (TYLENOL) 500 MG tablet Place 1,000 mg into feeding tube in the morning and at bedtime.    [provider]  acetaminophen (TYLENOL) 500 MG tablet Place 500 mg into feeding tube every 6 (six) hours as needed for mild pain (pain score 1-3).    [provider]  Amino Acids-Protein Hydrolys (PRO-STAT) LIQD Place 60 mLs into feeding tube daily.    [provider]  carvedilol (COREG) 6.25 MG tablet Place 1 tablet (6.25 mg total) into feeding tube 2 (two) times daily with a meal. 08/22/22   Setzer, Lynnell Jude, PA-C  ezetimibe (ZETIA) 10 MG tablet Place 1 tablet (10 mg total) into feeding tube daily. Patient taking differently: Place 2,100 mg into feeding tube at bedtime. 08/23/22   Setzer, Lynnell Jude, PA-C  famotidine (PEPCID) 20 MG tablet Place 1 tablet (20 mg total) into feeding tube daily at 4 PM. 04/30/23   Morene Crocker, MD  Ferrous Sulfate (ONE VITE FERROUS SULFATE) 300 MG/6.8ML SOLN Give 9.27 mLs by tube daily.    [provider]  FLUoxetine (PROZAC) 20 MG capsule Place 1 capsule (20 mg total)  into feeding tube at bedtime. 08/22/22   Setzer, Lynnell Jude, PA-C  gabapentin (NEURONTIN) 250 MG/5ML solution Place 2 mLs (100 mg total) into feeding tube every 8 (eight) hours. 08/22/22   Setzer, Lynnell Jude, PA-C  guaiFENesin (ROBITUSSIN) 100 MG/5ML liquid Place 100 mg into feeding tube 4 (four) times daily.    [provider]  insulin lispro (HUMALOG) 100 UNIT/ML KwikPen Inject 0-16 Units into the skin See admin instructions. Inject 0-16 units 4 times daily per sliding scale: 70-150 : 0 units 151-200 : 3 units 201-250 : 6 units 251-300 : 9 units 301-350 : 12 units 351-400 : 16 units > 401 : notify MD    [provider]  levETIRAcetam (KEPPRA) 100 MG/ML solution Place 5 mLs (500 mg total) into feeding tube 2 (two) times daily. 01/22/23   Micki Riley, MD  Multiple Vitamins-Minerals (PRESERVISION AREDS 2) CHEW Give 1 each by tube 2 (two) times daily.    [provider]  Nutritional Supplements (FEEDING SUPPLEMENT, GLUCERNA 1.5 CAL,) LIQD Place 40 mL/hr into feeding tube daily. Patient taking differently: Place 45 mL/hr into feeding tube 3 (three) times daily. Place 45ml into feeding tube continuouslt 04/30/23   Morene Crocker, MD  OXYGEN Inhale 4 L/min into the lungs continuous.    [provider]  Protein (FEEDING SUPPLEMENT, PROSOURCE TF20,) liquid Place 60 mLs into feeding tube daily. Patient not taking: Reported on 06/13/2023 04/30/23   Morene Crocker, MD  Water For Irrigation, Sterile (FREE WATER) SOLN Place 120 mLs into feeding tube every 4 (four) hours.    [provider]  Water For Irrigation, Sterile (STERILE WATER FOR IRRIGATION IR) Place 30 mLs into feeding tube in the morning, at noon, and at bedtime. Flush tube with 30ml of water before and after giving medications    [provider]    Physical Exam: BP (!) 77/51   Pulse 98   Temp (!) 100.8 F (38.2 C) (Rectal)   Resp (!) 23   Ht 5\' 4"  (1.626 m)   Wt 54 kg    SpO2 95%   BMI 20.43 kg/m  General: Somnolent, acutely ill and cachectic elderly woman laying in bed with neck flexed to the right side. No acute distress. HEENT: [place]/AT. Anicteric sclera. Pupils sluggish to light.Dry mucous membrane CV: Tachycardic. Regular rhythm. No murmurs, rubs, or gallops. No LE edema Pulmonary: On 2 L [place]. Anterior chest wall clear to auscultation. Abdominal: Soft, nontender, nondistended. Normal bowel sounds.  PEG tube stable in place. Extremities: Contracted extremities. Skin: Warm and dry. No obvious rash or lesions. Decreased skin turgor. Neuro: Eyes closed, does not track. Somnolent.  Does not follow any commands. Laying favoring her right side. No noticeable movement of the extremities.           Labs on Admission:  Basic Metabolic Panel: Recent Labs  Lab 07/20/23 2341 07/21/23 0144  NA 153* 153*  K 4.4 3.5  CL 111 114*  CO2 29 25  GLUCOSE 195* 170*  BUN 120* 114*  CREATININE 1.75* 1.67*  CALCIUM 9.2 8.5*   Liver Function Tests: Recent Labs  Lab 07/20/23 2341  AST 100*  ALT 86*  ALKPHOS 79  BILITOT 0.8  PROT 6.9  ALBUMIN 1.9*   No results for input(s): "LIPASE", "AMYLASE" in the last 168 hours. No results for input(s): "AMMONIA" in the last 168 hours. CBC: Recent Labs  Lab 07/20/23 2341  WBC 23.2*  NEUTROABS 19.9*  HGB 10.0*  HCT 31.8*  MCV 98.1  PLT 185   Cardiac Enzymes: No results for input(s): "CKTOTAL", "CKMB", "CKMBINDEX", "TROPONINI" in the last 168 hours. BNP (last 3 results) Recent Labs    03/09/23 0748  BNP 235.4*    ProBNP (last 3 results) No results for input(s): "PROBNP" in the last 8760 hours.  CBG: Recent Labs  Lab 07/21/23 0019  GLUCAP 184*    Radiological Exams on Admission: DG Chest Port 1 View Result Date: 07/20/2023 CLINICAL DATA:  Sepsis EXAM: PORTABLE CHEST 1 VIEW COMPARISON:  03/10/2023 FINDINGS: New elevation of the right hemidiaphragm. There is superimposed right basilar consolidation with  air bronchograms identified suspicious for changes of acute lobar pneumonia in the appropriate clinical setting. Left lung is clear. No pneumothorax or pleural effusion. Cardiac size within normal limits. Pulmonary vascularity is normal. IMPRESSION: 1. New elevation of the right hemidiaphragm with superimposed right basilar consolidation suspicious for changes of acute lobar pneumonia in the appropriate clinical setting. Electronically Signed   By: Gloris Ham  Ramiro Harvest M.D.   On: 07/20/2023 23:37   Assessment/Plan Gina Kelley is a 88 y.o. female with medical history significant for significant for PUD, iron deficiency anemia, subdural hematoma with residual left-sided hemiparesis and dysarthria, severe dysphagia with PEG dependency, chronic hypoxic respiratory failure on 4 L [place] and GI bleed who presented from her [name] due to abnormal labs and admitted for severe sepsis and hypernatremia  # Severe sepsis # Acute encephalopathy Elderly bedbound patient presenting with altered mental status found to have evidence of pneumonia on chest x-ray and UTI on UA. She meets severe sepsis criteria with leukocytosis, tachycardia, fever, tachypnea, lactic acidosis, altered mental status, AKI with evidence of respiratory and urinary infections. Patient remains altered on exam.  Status post IV NS 500 bolus x 2. Lactic acid improving from 4.9-4.0. -Continue IV fluid as below -Continue IV Rocephin and azithromycin -Follow-up blood culture, urine culture -Trend CBC, fever curve  # Hypernatremia Patient with history of dysphagia status post PEG tube presenting with altered mental status with a sodium of 157 at [name]. Sodium still elevated to 153 on admission.  Patient quite dehydrated on exam. Status post 1 L of IV fluid in the ED. Hypernatremia secondary to severe dehydration. She has a free water deficit of 2.3 L.  Due to her age and the acute nature of her hypernatremia, will need to replace her free water within the next  24 hours. -Start IV D5W at 200 cc/h, reduce rate if patient becomes significantly hyperglycemic -Check sodium every 2 hours -Reduce free water rate to 50 cc/h when sodium is <145  # Community-acquired pneumonia Significant leukocytosis to 23 with checks x-ray showing evidence of lobar pneumonia. -Continue IV Rocephin and azithromycin -Follow-up procalcitonin -Trend CBC, fever curve  # UTI Elderly patient presented with altered mental status and found to have evidence of infection on UA. -Continue IV Rocephin -Follow-up urine culture, blood culture  # AKI # Uremia Rising creatinine to 1.75 on admission from normal baseline. This is in the setting of her severe dehydration. BUN also elevated to 120. Uremia likely contributing to her altered mental status.  -Continue IV hydration as above -Follow-up morning BMP  # Chronic hypoxic respiratory failure Patient previously on 4 L [place] at baseline. No respiratory distress on exam. Currently on 2 L . -Continue supplemental O2  # Normocytic anemia # Iron deficiency anemia Hemoglobin stable at 10.0. Likely an element of hemoconcentration in setting of her dehydration. -Trend CBC  # T2DM CBG of 184. -Every 4 hours SSI with CBG monitoring  # Hx intracranial hemorrhage s/p craniotomy # Dysphagia History of severe dysphagia, dysarthria and left-sided hemiparesis after sustaining subdural hematoma now status post PEG tube since 1 year ago.Patient has been more altered over the last few days in the setting of acute infection and hypernatremia. -N.p.o. -IV fluids as above -Resume tube feeds in the morning when more alert -Registered dietitian consulted, appreciate assistance  # HTN -Hold antihypertensive meds in the setting of hypotension  # HLD # GERD # Depression # Seizure disorder -Resume medications when more alert  DVT prophylaxis: Xarelto    Code Status: Limited: Do not attempt resuscitation (DNR) -DNR-LIMITED -Do Not  Intubate/DNI   Consults called: None  Family Communication: Discussed admission with daughter on the phone  Severity of Illness: The appropriate patient status for this patient is INPATIENT. Inpatient status is judged to be reasonable and necessary in order to provide the required intensity of service to ensure the patient's safety. The  patient's presenting symptoms, physical exam findings, and initial radiographic and laboratory data in the context of their chronic comorbidities is felt to place them at high risk for further clinical deterioration. Furthermore, it is not anticipated that the patient will be medically stable for discharge from the hospital within 2 midnights of admission.   * I certify that at the point of admission it is my clinical judgment that the patient will require inpatient hospital care spanning beyond 2 midnights from the point of admission due to high intensity of service, high risk for further deterioration and high frequency of surveillance required.*  Level of care: Telemetry Medical   Steffanie Rainwater, MD 07/21/2023, 1:45 AM [facility] Pager: [phone] Isaiah 41:10   If 7PM-7AM, please contact night-coverage [email or web address] Password [email or web address]

## 2023-07-21 NOTE — ED Notes (Signed)
Gina Corin, MD notified of low BP. NS Bolus infusing

## 2023-07-21 NOTE — Progress Notes (Addendum)
PHARMACY - PHYSICIAN COMMUNICATION CRITICAL VALUE ALERT - BLOOD CULTURE IDENTIFICATION (BCID)  Gina Kelley is an 88 y.o. female who presented to [facility] on 07/20/2023 with a chief complaint of PNA.  Assessment:  1/4 anaerobic GPCs in cluters, BCID with staph spp  Name of physician (or Provider) Contacted: Dr. Loney Loh  Current antibiotics: Rocephin / azithro  Changes to prescribed antibiotics recommended:  Response not received from provider;  current antibiotics are likely to cover the isolated organism.  Consider de-escalation soon.  Results for orders placed or performed during the hospital encounter of 07/20/23  Blood Culture ID Panel (Reflexed) (Collected: 07/20/2023 11:39 PM)  Result Value Ref Range   Enterococcus faecalis NOT DETECTED NOT DETECTED   Enterococcus Faecium NOT DETECTED NOT DETECTED   Listeria monocytogenes NOT DETECTED NOT DETECTED   Staphylococcus species DETECTED (A) NOT DETECTED   Staphylococcus aureus (BCID) NOT DETECTED NOT DETECTED   Staphylococcus epidermidis NOT DETECTED NOT DETECTED   Staphylococcus lugdunensis NOT DETECTED NOT DETECTED   Streptococcus species NOT DETECTED NOT DETECTED   Streptococcus agalactiae NOT DETECTED NOT DETECTED   Streptococcus pneumoniae NOT DETECTED NOT DETECTED   Streptococcus pyogenes NOT DETECTED NOT DETECTED   A.calcoaceticus-baumannii NOT DETECTED NOT DETECTED   Bacteroides fragilis NOT DETECTED NOT DETECTED   Enterobacterales NOT DETECTED NOT DETECTED   Enterobacter cloacae complex NOT DETECTED NOT DETECTED   Escherichia coli NOT DETECTED NOT DETECTED   Klebsiella aerogenes NOT DETECTED NOT DETECTED   Klebsiella oxytoca NOT DETECTED NOT DETECTED   Klebsiella pneumoniae NOT DETECTED NOT DETECTED   Proteus species NOT DETECTED NOT DETECTED   Salmonella species NOT DETECTED NOT DETECTED   Serratia marcescens NOT DETECTED NOT DETECTED   Haemophilus influenzae NOT DETECTED NOT DETECTED   Neisseria meningitidis  NOT DETECTED NOT DETECTED   Pseudomonas aeruginosa NOT DETECTED NOT DETECTED   Stenotrophomonas maltophilia NOT DETECTED NOT DETECTED   Candida albicans NOT DETECTED NOT DETECTED   Candida auris NOT DETECTED NOT DETECTED   Candida glabrata NOT DETECTED NOT DETECTED   Candida krusei NOT DETECTED NOT DETECTED   Candida parapsilosis NOT DETECTED NOT DETECTED   Candida tropicalis NOT DETECTED NOT DETECTED   Cryptococcus neoformans/gattii NOT DETECTED NOT DETECTED    [name] 07/21/2023  8:20 PM

## 2023-07-22 DIAGNOSIS — R6520 Severe sepsis without septic shock: Secondary | ICD-10-CM | POA: Diagnosis not present

## 2023-07-22 DIAGNOSIS — A419 Sepsis, unspecified organism: Secondary | ICD-10-CM | POA: Diagnosis not present

## 2023-07-22 LAB — SODIUM
Sodium: 140 mmol/L (ref 135–145)
Sodium: 144 mmol/L (ref 135–145)
Sodium: 143 mmol/L (ref 135–145)
Sodium: 142 mmol/L (ref 135–145)
Sodium: 143 mmol/L (ref 135–145)

## 2023-07-22 LAB — GLUCOSE, CAPILLARY
Glucose-Capillary: 102 mg/dL — ABNORMAL HIGH (ref 70–99)
Glucose-Capillary: 109 mg/dL — ABNORMAL HIGH (ref 70–99)
Glucose-Capillary: 116 mg/dL — ABNORMAL HIGH (ref 70–99)
Glucose-Capillary: 159 mg/dL — ABNORMAL HIGH (ref 70–99)
Glucose-Capillary: 168 mg/dL — ABNORMAL HIGH (ref 70–99)
Glucose-Capillary: 171 mg/dL — ABNORMAL HIGH (ref 70–99)
Glucose-Capillary: 229 mg/dL — ABNORMAL HIGH (ref 70–99)

## 2023-07-22 MED ORDER — THIAMINE MONONITRATE 100 MG PO TABS
100.0000 mg | ORAL_TABLET | Freq: Every day | ORAL | Status: AC
  Administered 2023-07-22 – 2023-07-26 (×5): 100 mg
  Filled 2023-07-22 (×5): qty 1

## 2023-07-22 MED ORDER — DEXTROSE 5 % IV SOLN
INTRAVENOUS | Status: DC
Start: 2023-07-22 — End: 2023-07-22

## 2023-07-22 MED ORDER — GLUCERNA 1.5 CAL PO LIQD
1000.0000 mL | ORAL | Status: DC
  Administered 2023-07-22 – 2023-07-24 (×3): 1000 mL
  Filled 2023-07-22 (×6): qty 1000

## 2023-07-22 NOTE — Progress Notes (Signed)
Initial Nutrition Assessment  DOCUMENTATION CODES:   Not applicable  INTERVENTION:   Initiate tube feeding via PEG: Gucerna 1.5 at 50ml/h ( per day) Free water flush of Q4 hrs  Provides 1620 kcal, 89 gm protein, free water daily   Thiamine 100mg  x5 days Monitor K+, PHOS, Mg for refeeding  NUTRITION DIAGNOSIS:   Inadequate oral intake related to inability to eat as evidenced by NPO status.   GOAL:  Patient will meet greater than or equal to 90% of their needs   MONITOR:  Labs, Weight trends, TF tolerance  REASON FOR ASSESSMENT:  Consult Assessment of nutrition requirement/status, Enteral/tube feeding initiation and management (Has chronic PEG tube with TF at 45 cc/hr at her [name])  ASSESSMENT:   Pt presented [facility] ED from [facility] d/t abnormal labs. Found to have severe sepsis 2/2 community acquired PNA and UTI. PMH: PUD, iron deficiency anemia, subdural hematoma w/ residual L sided hemiparesis and dysarthria, T2DM, HTN, CVA, severe dysphagia w/ PEG dependency.  She currently receives Glucerna 1.5 @45mL  per hour at her [name], which appears to be meeting her estimated calorie and protein needs sufficiently. Will reinstate while admitted and monitor tolerance and progress toward discharge. Blood sugars well-controlled on this regimen. Most recent A1c within desirable range at 5.8% in August 2024.  Recommend refeeding protocol given patient's inability to endorse adherence to TF regimen PTA. Na+ on arrival 153, now down to 143 w/ fluid repletion. Per hospitalist note, 2.3L free water deficit on admission.   Admit Weight: 54kg accuracy? appears to have been taken from previous MD appt on 1/6  Pt unable to verbalize UBW, as she is not conversational at baseline. Per chart review, UBW over last 6 mos between 53-57kg. No edema currently documented. Will monitor wt trends while admitted and increase calories provided from TF if wt loss observed.    Intake/Output  Summary (Last 24 hours) at 07/22/2023 1549 Last data filed at 07/22/2023 1531 Gross per 24 hour  Intake 4076.76 ml  Output 1300 ml  Net 2776.76 ml     Net IO Since Admission: 4,116.92 mL [07/22/23 1549]   Labs: CBGs 170-211mg /dL over 24 hours Z6X 5.8 (01/2023) Crt 1.75-->1.67-->1.70 Lactic Acid 4.0-->3.1-->2.3 WBC 23.2-->20.1  Meds: SSI, IV ABX, D5W  Diet Order:   Diet Order             Diet NPO time specified  Diet effective now             EDUCATION NEEDS:   Not appropriate for education at this time  Skin:  Skin Assessment: Reviewed RN Assessment  Last BM:  1/21 - type 7  Height:  Ht Readings from Last 1 Encounters:  07/20/23 5\' 4"  (1.626 m)    Weight:  Wt Readings from Last 1 Encounters:  07/21/23 48.8 kg    Ideal Body Weight:  54.5 kg  BMI:  Body mass index is 18.47 kg/m.  Estimated Nutritional Needs:   Kcal:  1500-1700kcal  Protein:  80-90g  Fluid:  1.5-1.7L/day   Myrtie Cruise MS, RD, LDN Registered Dietitian Clinical Nutrition RD Inpatient Contact Info in Amion

## 2023-07-22 NOTE — Inpatient Diabetes Management (Incomplete)
Inpatient Diabetes Program Recommendations  AACE/ADA: New Consensus Statement on Inpatient Glycemic Control (2015)  Target Ranges:  Prepandial:   less than 140 mg/dL      Peak postprandial:   less than 180 mg/dL (1-2 hours)      Critically ill patients:  140 - 180 mg/dL   Lab Results  Component Value Date   GLUCAP 168 (H) 07/22/2023   HGBA1C 5.8 (H) 02/14/2023    Review of Glycemic Control  Diabetes history: *** Outpatient Diabetes medications: *** Current orders for Inpatient glycemic control: ***  Inpatient Diabetes Program Recommendations:

## 2023-07-22 NOTE — Plan of Care (Signed)
  Problem: Education: Goal: Ability to describe self-care measures that may prevent or decrease complications (Diabetes Survival Skills Education) will improve Outcome: Progressing Goal: Individualized Educational Video(s) Outcome: Progressing   Problem: Coping: Goal: Ability to adjust to condition or change in health will improve Outcome: Progressing   Problem: Health Behavior/Discharge Planning: Goal: Ability to identify and utilize available resources and services will improve Outcome: Progressing Goal: Ability to manage health-related needs will improve Outcome: Progressing   Problem: Fluid Volume: Goal: Ability to maintain a balanced intake and output will improve Outcome: Progressing

## 2023-07-22 NOTE — Progress Notes (Signed)
PROGRESS NOTE    Gina Kelley  JXB:[id number] DOB: 1936-03-26 DOA: 07/20/2023 PCP: System, Provider Not In   Brief Narrative:  88 y.o. 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  As per daughter, at baseline, patient is not conversational but her eyes are usually open, she is able to track and occasionally moves her right leg.  On presentation, sodium was 157, creatinine of 1.41, WBC of 18.8.  Chest x-ray showed right basilar consolidation suspicious for acute lobar pneumonia.  She was started on IV fluids and antibiotics.  Assessment & Plan:   Severe sepsis: Present on admission Acute metabolic encephalopathy Community-acquired pneumonia UTI: Present on admission Lactic acidosis: Improving Leukocytosis -Presented with altered mental status with evidence of pneumonia on chest x-ray and UTI along with leukocytosis/tachycardia/fever/tachypnea/lactic acidosis/AKI -Continue Rocephin and Zithromax.  COVID/RSV/influenza PCR negative on presentation. -Blood cultures growing gram-positive cocci in clusters in both aerobic and anaerobic bottles.  Follow urine cultures - No temperature spikes over the last 24 hours. -Off IV fluids -WBCs pending for today  Hypernatremia -Possibly from dehydration -Sodium 157 at [name].  Treated with IV fluids.  Resolved.  Sodium 143 this morning.  Monitor  Chronic respiratory failure with hypoxia -Normally on 4 L oxygen via nasal cannula.  Currently stable.  Continue oxygen supplementation  Macrocytic anemia/iron deficiency anemia -Hemoglobin currently stable.  Monitor intermittently.  Diabetes mellitus type 2 with hyperglycemia -Continue CBGs with SSI.  History of  intracranial hemorrhage/subdural hematoma s/p craniotomy with dysphagia/dysarthria and left-sided hemiparesis -Has been PEG tube dependent for a year now.  Dietitian consulted.  Resume tube feeds  Hypertension -Blood pressure on the lower side.  Antihypertensives on hold  Hyperlipidemia GERD Depression Seizure disorder -Resume medications when alert  Physical deconditioning -PT eval  Goals of care -Consult palliative care for goals of care discussion.  Prognosis very poor.    DVT prophylaxis: Xarelto Code Status: DNR Family Communication: None at bedside Disposition Plan: Status is: Inpatient Remains inpatient appropriate because: Of severity of illness    Consultants: None  Procedures: None  Antimicrobials:  Anti-infectives (From admission, onward)    Start     Dose/Rate Route Frequency Ordered Stop   07/22/23 0200  azithromycin (ZITHROMAX) 500 mg in sodium chloride 0.9 % 250 mL IVPB        500 mg 250 mL/hr over 60 Minutes Intravenous Every 24 hours 07/21/23 0334 07/26/23 0159   07/22/23 0000  cefTRIAXone (ROCEPHIN) 1 g in sodium chloride 0.9 % 100 mL IVPB        1 g 200 mL/hr over 30 Minutes Intravenous Every 24 hours 07/21/23 0334     07/21/23 0045  cefTRIAXone (ROCEPHIN) 1 g in sodium chloride 0.9 % 100 mL IVPB        1 g 200 mL/hr over 30 Minutes Intravenous  Once 07/21/23 0038 07/21/23 0119   07/21/23 0045  azithromycin (ZITHROMAX) 500 mg in sodium chloride 0.9 % 250 mL IVPB        500 mg 250 mL/hr over 60 Minutes Intravenous  Once 07/21/23 0038 07/21/23 0150        Subjective: Patient seen and examined at bedside.  No seizures, vomiting or agitation reported.  Objective: Vitals:   07/21/23 1312 07/21/23 1638 07/21/23 2032 07/22/23  0539  BP: 119/63 119/64 124/63 117/61  Pulse: 86 90 88 92  Resp: 18  18 18   Temp:  97.8 F (36.6 C) 98.3 F (36.8 C) 98.3 F (36.8 C)  TempSrc:  Axillary    SpO2: 99% 93% 100% 100%  Weight:      Height:         Intake/Output Summary (Last 24 hours) at 07/22/2023 0846 Last data filed at 07/22/2023 0743 Gross per 24 hour  Intake 3952.14 ml  Output 1300 ml  Net 2652.14 ml   Filed Weights   07/20/23 2325 07/21/23 1300  Weight: 54 kg 48.8 kg    Examination:  General exam: Appears calm and comfortable.  Looks chronically ill and deconditioned. Respiratory system: Bilateral decreased breath sounds at bases Cardiovascular system: S1 & S2 heard, Rate controlled Gastrointestinal system: Abdomen is nondistended, soft and nontender. Normal bowel sounds heard.  PEG tube present Extremities: No cyanosis, clubbing, edema.  Contracted extremities. Central nervous system: Confused.  Does not follow commands.   Skin: No rashes, lesions or ulcers Psychiatry: Flat affect.  Not agitated.    Data Reviewed: I have personally reviewed following labs and imaging studies  CBC: Recent Labs  Lab 07/20/23 2341 07/21/23 0407  WBC 23.2* 20.1*  NEUTROABS 19.9*  --   HGB 10.0* 8.1*  HCT 31.8* 28.2*  MCV 98.1 102.5*  PLT 185 134*   Basic Metabolic Panel: Recent Labs  Lab 07/20/23 2341 07/21/23 0144 07/21/23 0407 07/21/23 0622 07/21/23 2122 07/21/23 2308 07/22/23 0430 07/22/23 0515 07/22/23 0718  NA 153* 153* 156*   < > 145 142 144 140 143  K 4.4 3.5 3.8  --   --   --   --   --   --   CL 111 114* 116*  --   --   --   --   --   --   CO2 29 25 27   --   --   --   --   --   --   GLUCOSE 195* 170* 211*  --   --   --   --   --   --   BUN 120* 114* 112*  --   --   --   --   --   --   CREATININE 1.75* 1.67* 1.70*  --   --   --   --   --   --   CALCIUM 9.2 8.5* 8.3*  --   --   --   --   --   --    < > = values in this interval not displayed.   GFR: Estimated Creatinine Clearance: 18.6 mL/min (A) (by C-G formula based on SCr of 1.7 mg/dL (H)). Liver Function Tests: Recent Labs  Lab 07/20/23 2341  AST 100*  ALT 86*  ALKPHOS 79  BILITOT 0.8  PROT 6.9  ALBUMIN 1.9*   No results for input(s):  "LIPASE", "AMYLASE" in the last 168 hours. No results for input(s): "AMMONIA" in the last 168 hours. Coagulation Profile: No results for input(s): "INR", "PROTIME" in the last 168 hours. Cardiac Enzymes: No results for input(s): "CKTOTAL", "CKMB", "CKMBINDEX", "TROPONINI" in the last 168 hours. BNP (last 3 results) No results for input(s): "PROBNP" in the last 8760 hours. HbA1C: No results for input(s): "HGBA1C" in the last 72 hours. CBG: Recent Labs  Lab 07/21/23 1316 07/21/23 1622 07/21/23 2032 07/22/23 0105 07/22/23 0512  GLUCAP 310* 331* 336* 229* 168*   Lipid Profile:  No results for input(s): "CHOL", "HDL", "LDLCALC", "TRIG", "CHOLHDL", "LDLDIRECT" in the last 72 hours. Thyroid Function Tests: Recent Labs    07/21/23 0407  TSH 1.134   Anemia Panel: No results for input(s): "VITAMINB12", "FOLATE", "FERRITIN", "TIBC", "IRON", "RETICCTPCT" in the last 72 hours. Sepsis Labs: Recent Labs  Lab 07/21/23 0027 07/21/23 0225 07/21/23 0407 07/21/23 0800  PROCALCITON  --   --  0.82  --   LATICACIDVEN 4.9* 4.0* 3.1* 2.3*    Recent Results (from the past 240 hours)  Blood Culture (routine x 2)     Status: None (Preliminary result)   Collection Time: 07/20/23 11:39 PM   Specimen: BLOOD RIGHT HAND  Result Value Ref Range Status   Specimen Description BLOOD RIGHT HAND  Final   Special Requests   Final    BOTTLES DRAWN AEROBIC AND ANAEROBIC Blood Culture adequate volume   Culture  Setup Time   Final    GRAM POSITIVE COCCI IN CLUSTERS IN BOTH AEROBIC AND ANAEROBIC BOTTLES CRITICAL RESULT CALLED TO, READ BACK BY AND VERIFIED WITH: Duwaine Maxin [id number] @ 1937 FH Performed at [facility], [address], [place], [place] [address]    Culture GRAM POSITIVE COCCI IN CLUSTERS  Final   Report Status PENDING  Incomplete  Blood Culture ID Panel (Reflexed)     Status: Abnormal   Collection Time: 07/20/23 11:39 PM  Result Value Ref Range Status   Enterococcus faecalis NOT  DETECTED NOT DETECTED Final   Enterococcus Faecium NOT DETECTED NOT DETECTED Final   Listeria monocytogenes NOT DETECTED NOT DETECTED Final   Staphylococcus species DETECTED (A) NOT DETECTED Final    Comment: CRITICAL RESULT CALLED TO, READ BACK BY AND VERIFIED WITH: PHARMD E. PAYTES [id number] @ 1937 FH    Staphylococcus aureus (BCID) NOT DETECTED NOT DETECTED Final   Staphylococcus epidermidis NOT DETECTED NOT DETECTED Final   Staphylococcus lugdunensis NOT DETECTED NOT DETECTED Final   Streptococcus species NOT DETECTED NOT DETECTED Final   Streptococcus agalactiae NOT DETECTED NOT DETECTED Final   Streptococcus pneumoniae NOT DETECTED NOT DETECTED Final   Streptococcus pyogenes NOT DETECTED NOT DETECTED Final   A.calcoaceticus-baumannii NOT DETECTED NOT DETECTED Final   Bacteroides fragilis NOT DETECTED NOT DETECTED Final   Enterobacterales NOT DETECTED NOT DETECTED Final   Enterobacter cloacae complex NOT DETECTED NOT DETECTED Final   Escherichia coli NOT DETECTED NOT DETECTED Final   Klebsiella aerogenes NOT DETECTED NOT DETECTED Final   Klebsiella oxytoca NOT DETECTED NOT DETECTED Final   Klebsiella pneumoniae NOT DETECTED NOT DETECTED Final   Proteus species NOT DETECTED NOT DETECTED Final   Salmonella species NOT DETECTED NOT DETECTED Final   Serratia marcescens NOT DETECTED NOT DETECTED Final   Haemophilus influenzae NOT DETECTED NOT DETECTED Final   Neisseria meningitidis NOT DETECTED NOT DETECTED Final   Pseudomonas aeruginosa NOT DETECTED NOT DETECTED Final   Stenotrophomonas maltophilia NOT DETECTED NOT DETECTED Final   Candida albicans NOT DETECTED NOT DETECTED Final   Candida auris NOT DETECTED NOT DETECTED Final   Candida glabrata NOT DETECTED NOT DETECTED Final   Candida krusei NOT DETECTED NOT DETECTED Final   Candida parapsilosis NOT DETECTED NOT DETECTED Final   Candida tropicalis NOT DETECTED NOT DETECTED Final   Cryptococcus neoformans/gattii NOT DETECTED NOT  DETECTED Final    Comment: Performed at [facility], [address] [place], [place] [address]  Blood Culture (routine x 2)     Status: None (Preliminary result)  Collection Time: 07/20/23 11:43 PM   Specimen: BLOOD  Result Value Ref Range Status   Specimen Description BLOOD BLOOD LEFT ARM  Final   Special Requests   Final    BOTTLES DRAWN AEROBIC AND ANAEROBIC Blood Culture adequate volume   Culture   Final    NO GROWTH < 12 HOURS Performed at [facility], [address] [place], [place] [address]    Report Status PENDING  Incomplete  Resp panel by RT-PCR (RSV, Flu A&B, Covid) Anterior Nasal Swab     Status: None   Collection Time: 07/20/23 11:43 PM   Specimen: Anterior Nasal Swab  Result Value Ref Range Status   SARS Coronavirus 2 by RT PCR NEGATIVE NEGATIVE Final   Influenza A by PCR NEGATIVE NEGATIVE Final   Influenza B by PCR NEGATIVE NEGATIVE Final    Comment: (NOTE) The Xpert Xpress SARS-CoV-2/FLU/RSV plus assay is intended as an aid in the diagnosis of influenza from Nasopharyngeal swab specimens and should not be used as a sole basis for treatment. Nasal washings and aspirates are unacceptable for Xpert Xpress SARS-CoV-2/FLU/RSV testing.  Fact Sheet for Patients: BloggerCourse.com  Fact Sheet for Healthcare Providers: SeriousBroker.it  This test is not yet approved or cleared by the [place] FDA and has been authorized for detection and/or diagnosis of SARS-CoV-2 by FDA under an Emergency Use Authorization (EUA). This EUA will remain in effect (meaning this test can be used) for the duration of the COVID-19 declaration under Section 564(b)(1) of the Act, 21 U.S.C. section 360bbb-3(b)(1), unless the authorization is terminated or revoked.     Resp Syncytial Virus by PCR NEGATIVE NEGATIVE Final    Comment: (NOTE) Fact Sheet for Patients: BloggerCourse.com  Fact Sheet  for Healthcare Providers: SeriousBroker.it  This test is not yet approved or cleared by the [place] FDA and has been authorized for detection and/or diagnosis of SARS-CoV-2 by FDA under an Emergency Use Authorization (EUA). This EUA will remain in effect (meaning this test can be used) for the duration of the COVID-19 declaration under Section 564(b)(1) of the Act, 21 U.S.C. section 360bbb-3(b)(1), unless the authorization is terminated or revoked.  Performed at [facility], [address] [place], [place] [address]          Radiology Studies: DG Chest Port 1 View Result Date: 07/20/2023 CLINICAL DATA:  Sepsis EXAM: PORTABLE CHEST 1 VIEW COMPARISON:  03/10/2023 FINDINGS: New elevation of the right hemidiaphragm. There is superimposed right basilar consolidation with air bronchograms identified suspicious for changes of acute lobar pneumonia in the appropriate clinical setting. Left lung is clear. No pneumothorax or pleural effusion. Cardiac size within normal limits. Pulmonary vascularity is normal. IMPRESSION: 1. New elevation of the right hemidiaphragm with superimposed right basilar consolidation suspicious for changes of acute lobar pneumonia in the appropriate clinical setting. Electronically Signed   By: Helyn Numbers M.D.   On: 07/20/2023 23:37        Scheduled Meds:  insulin aspart  0-9 Units Subcutaneous Q4H   rivaroxaban  10 mg Oral Daily   Continuous Infusions:  azithromycin Stopped (07/22/23 0337)   cefTRIAXone (ROCEPHIN)  IV 1 g (07/22/23 0049)          Glade Lloyd, MD [facility] 07/22/2023, 8:46 AM

## 2023-07-22 NOTE — Consult Note (Signed)
[facility] Liaison Consult Note  07/22/2023  Gina Kelley 1936-04-14 [id number]  Insurance: [facility] Medicare   Primary Care Provider: System, Provider Not In    [facility] Liaison met patient at bedside at [facility].  Patient is unable to participate in verbal assessment, admitted from LT [name]. The patient was screened for  day readmission hospitalization with noted extreme high risk score for unplanned readmission risk 6 hospital admissions in 6 months.  The patient was assessed for potential Community Care Coordination service needs for post hospital transition for care coordination. Review of patient's electronic medical record reveals patient is a patient from LT Care [name].   Plan: No current community care coordination due to needs are to be met at a [name] level of care.   [facility] Community Care, Population Health does not replace or interfere with any arrangements made by the Inpatient Transition of Care team.   For questions contact:   Charlesetta Shanks, RN, BSN, CCM [place]  [facility], [facility] Liaison Direct Dial: [phone] or secure chat Email: [email or web address]

## 2023-07-23 DIAGNOSIS — A419 Sepsis, unspecified organism: Secondary | ICD-10-CM | POA: Diagnosis not present

## 2023-07-23 DIAGNOSIS — R6520 Severe sepsis without septic shock: Secondary | ICD-10-CM | POA: Diagnosis not present

## 2023-07-23 LAB — CULTURE, BLOOD (ROUTINE X 2): Special Requests: ADEQUATE

## 2023-07-23 LAB — CBC WITH DIFFERENTIAL/PLATELET
Abs Immature Granulocytes: 0.09 10*3/uL — ABNORMAL HIGH (ref 0.00–0.07)
Basophils Absolute: 0 10*3/uL (ref 0.0–0.1)
Basophils Relative: 0 %
Eosinophils Absolute: 0.2 10*3/uL (ref 0.0–0.5)
Eosinophils Relative: 2 %
HCT: 26.4 % — ABNORMAL LOW (ref 36.0–46.0)
Hemoglobin: 7.9 g/dL — ABNORMAL LOW (ref 12.0–15.0)
Immature Granulocytes: 1 %
Lymphocytes Relative: 7 %
Lymphs Abs: 0.8 10*3/uL (ref 0.7–4.0)
MCH: 29.2 pg (ref 26.0–34.0)
MCHC: 29.9 g/dL — ABNORMAL LOW (ref 30.0–36.0)
MCV: 97.4 fL (ref 80.0–100.0)
Monocytes Absolute: 1 10*3/uL (ref 0.1–1.0)
Monocytes Relative: 9 %
Neutro Abs: 8.6 10*3/uL — ABNORMAL HIGH (ref 1.7–7.7)
Neutrophils Relative %: 81 %
Platelets: 137 10*3/uL — ABNORMAL LOW (ref 150–400)
RBC: 2.71 MIL/uL — ABNORMAL LOW (ref 3.87–5.11)
RDW: 17.4 % — ABNORMAL HIGH (ref 11.5–15.5)
WBC: 10.6 10*3/uL — ABNORMAL HIGH (ref 4.0–10.5)
nRBC: 0.3 % — ABNORMAL HIGH (ref 0.0–0.2)

## 2023-07-23 LAB — COMPREHENSIVE METABOLIC PANEL
ALT: 118 U/L — ABNORMAL HIGH (ref 0–44)
AST: 177 U/L — ABNORMAL HIGH (ref 15–41)
Albumin: <1.5 g/dL — ABNORMAL LOW (ref 3.5–5.0)
Alkaline Phosphatase: 78 U/L (ref 38–126)
Anion gap: 6 (ref 5–15)
BUN: 57 mg/dL — ABNORMAL HIGH (ref 8–23)
CO2: 31 mmol/L (ref 22–32)
Calcium: 8.4 mg/dL — ABNORMAL LOW (ref 8.9–10.3)
Chloride: 111 mmol/L (ref 98–111)
Creatinine, Ser: 0.83 mg/dL (ref 0.44–1.00)
GFR, Estimated: >60 mL/min (ref >60)
Glucose, Bld: 171 mg/dL — ABNORMAL HIGH (ref 70–99)
Potassium: 3.3 mmol/L — ABNORMAL LOW (ref 3.5–5.1)
Sodium: 148 mmol/L — ABNORMAL HIGH (ref 135–145)
Total Bilirubin: 0.3 mg/dL (ref 0.0–1.2)
Total Protein: 6.2 g/dL — ABNORMAL LOW (ref 6.5–8.1)

## 2023-07-23 LAB — GLUCOSE, CAPILLARY
Glucose-Capillary: 153 mg/dL — ABNORMAL HIGH (ref 70–99)
Glucose-Capillary: 226 mg/dL — ABNORMAL HIGH (ref 70–99)
Glucose-Capillary: 189 mg/dL — ABNORMAL HIGH (ref 70–99)
Glucose-Capillary: 176 mg/dL — ABNORMAL HIGH (ref 70–99)

## 2023-07-23 LAB — MAGNESIUM: Magnesium: 2.9 mg/dL — ABNORMAL HIGH (ref 1.7–2.4)

## 2023-07-23 LAB — PHOSPHORUS: Phosphorus: 2.6 mg/dL (ref 2.5–4.6)

## 2023-07-23 MED ORDER — POTASSIUM CL IN DEXTROSE 5% 20 MEQ/L IV SOLN
20.0000 meq | INTRAVENOUS | Status: AC
  Administered 2023-07-23: 20 meq via INTRAVENOUS
  Filled 2023-07-23: qty 1000

## 2023-07-23 NOTE — Progress Notes (Signed)
PROGRESS NOTE    Gina Kelley  ZOX:[id number] DOB: 1936/05/03 DOA: 07/20/2023 PCP: System, Provider Not In   Brief Narrative:  88 y.o. female with medical history significant for PUD, iron deficiency anemia, subdural hematoma with residual left hemiparesis and dysarthria, severe dysphagia with PEG dependency, chronic hypoxic respiratory failure on 4 L [place], and GI bleed presented from her [name] due to abnormal labs along with increased confusion and hypotension.  As per daughter, at baseline, patient is not conversational but her eyes are usually open, she is able to track and occasionally moves her right leg.  On presentation, sodium was 157, creatinine of 1.41, WBC of 18.8.  Chest x-ray showed right basilar consolidation suspicious for acute lobar pneumonia.  She was started on IV fluids and antibiotics.  Assessment & Plan:   Severe sepsis: Present on admission Acute metabolic encephalopathy Community-acquired pneumonia UTI: Present on admission Lactic acidosis: Improving Leukocytosis -Presented with altered mental status with evidence of pneumonia on chest x-ray and UTI along with leukocytosis/tachycardia/fever/tachypnea/lactic acidosis/AKI -Continue Rocephin and Zithromax.  COVID/RSV/influenza PCR negative on presentation. -Blood cultures grew Staph hominis: Most likely contaminant.  Urine cultures pending - No temperature spikes over the last 48 hours. -Off IV fluids -WBCs improved and is normal today  Hypernatremia -Possibly from dehydration -Sodium 157 at [name].  Treated with IV fluids. Sodium 148 this morning.  Resume gentle hydration.  Monitor  Chronic respiratory failure with hypoxia -Normally on 4 L oxygen via nasal cannula.  Currently stable.  Continue oxygen supplementation  Elevated LFTs -Patient has chronically elevated LFTs.  Questionable cause.  Repeat a.m. LFTs.  Hypokalemia -Mild.  Monitor  Anemia of chronic disease/iron deficiency anemia -Hemoglobin currently  stable.  Monitor intermittently.  Diabetes mellitus type 2 with hyperglycemia -Continue CBGs with SSI.  History of intracranial hemorrhage/subdural hematoma s/p craniotomy with dysphagia/dysarthria and left-sided hemiparesis -Has been PEG tube dependent for a year now.  Tube feeds have been resumed as per dietitian recommendations.  Hypertension -Blood pressure on the lower side.  Antihypertensives on hold  Hyperlipidemia GERD Depression Seizure disorder -Will resume meds via tube  Physical deconditioning -PT eval  Goals of care -Consult palliative care for goals of care discussion.  Prognosis very poor.    DVT prophylaxis: Xarelto Code Status: DNR Family Communication: None at bedside Disposition Plan: Status is: Inpatient Remains inpatient appropriate because: Of severity of illness    Consultants: Palliative care  Procedures: None  Antimicrobials:  Anti-infectives (From admission, onward)    Start     Dose/Rate Route Frequency Ordered Stop   07/22/23 0200  azithromycin (ZITHROMAX) 500 mg in sodium chloride 0.9 % 250 mL IVPB        500 mg 250 mL/hr over 60 Minutes Intravenous Every 24 hours 07/21/23 0334 07/26/23 0159   07/22/23 0000  cefTRIAXone (ROCEPHIN) 1 g in sodium chloride 0.9 % 100 mL IVPB        1 g 200 mL/hr over 30 Minutes Intravenous Every 24 hours 07/21/23 0334     07/21/23 0045  cefTRIAXone (ROCEPHIN) 1 g in sodium chloride 0.9 % 100 mL IVPB        1 g 200 mL/hr over 30 Minutes Intravenous  Once 07/21/23 0038 07/21/23 0119   07/21/23 0045  azithromycin (ZITHROMAX) 500 mg in sodium chloride 0.9 % 250 mL IVPB        500 mg 250 mL/hr over 60 Minutes Intravenous  Once 07/21/23 0038 07/21/23 0150        Subjective:  Patient seen and examined at bedside.  Extremely poor historian.  Does not follow commands.  No agitation, seizures, vomiting or fever reported. Objective: Vitals:   07/22/23 0539 07/22/23 1545 07/22/23 2058 07/23/23 0508  BP: 117/61  120/63 (!) 109/57 134/66  Pulse: 92 90 75 80  Resp: 18 12 18 18   Temp: 98.3 F (36.8 C) 98.7 F (37.1 C) 98.1 F (36.7 C) 98.1 F (36.7 C)  TempSrc:  Oral    SpO2: 100% 100% 100% 96%  Weight:      Height:        Intake/Output Summary (Last 24 hours) at 07/23/2023 0646 Last data filed at 07/22/2023 2219 Gross per 24 hour  Intake 445.35 ml  Output 800 ml  Net -354.65 ml   Filed Weights   07/20/23 2325 07/21/23 1300  Weight: 54 kg 48.8 kg    Examination:  General: Chronically ill and deconditioned looking.  No acute distress. ENT/neck: No thyromegaly.  JVD is not elevated  respiratory: Decreased breath sounds at bases bilaterally with some crackles; no wheezing  CVS: S1-S2 heard, rate controlled Abdominal: Soft, nontender, slightly distended; no organomegaly,  bowel sounds are heard.  At the present Extremities: Trace lower extremity edema; no cyanosis  CNS: Remains confused; does not follow commands.  Lymph: No obvious lymphadenopathy Skin: No obvious ecchymosis/lesions  psych: Showing no signs of agitation.  Extremely flat affect musculoskeletal: Contracted extremities present     Data Reviewed: I have personally reviewed following labs and imaging studies  CBC: Recent Labs  Lab 07/20/23 2341 07/21/23 0407 07/23/23 0327  WBC 23.2* 20.1* 10.6*  NEUTROABS 19.9*  --  8.6*  HGB 10.0* 8.1* 7.9*  HCT 31.8* 28.2* 26.4*  MCV 98.1 102.5* 97.4  PLT 185 134* 137*   Basic Metabolic Panel: Recent Labs  Lab 07/20/23 2341 07/21/23 0144 07/21/23 0407 07/21/23 0622 07/22/23 0515 07/22/23 0718 07/22/23 0841 07/22/23 1104 07/23/23 0327  NA 153* 153* 156*   < > 140 143 142 143 148*  K 4.4 3.5 3.8  --   --   --   --   --  3.3*  CL 111 114* 116*  --   --   --   --   --  111  CO2 29 25 27   --   --   --   --   --  31  GLUCOSE 195* 170* 211*  --   --   --   --   --  171*  BUN 120* 114* 112*  --   --   --   --   --  57*  CREATININE 1.75* 1.67* 1.70*  --   --   --   --    --  0.83  CALCIUM 9.2 8.5* 8.3*  --   --   --   --   --  8.4*  MG  --   --   --   --   --   --   --   --  2.9*  PHOS  --   --   --   --   --   --   --   --  2.6   < > = values in this interval not displayed.   GFR: Estimated Creatinine Clearance: 38.2 mL/min (by C-G formula based on SCr of 0.83 mg/dL). Liver Function Tests: Recent Labs  Lab 07/20/23 2341 07/23/23 0327  AST 100* 177*  ALT 86* 118*  ALKPHOS 79 78  BILITOT 0.8 0.3  PROT 6.9 6.2*  ALBUMIN 1.9* <1.5*   No results for input(s): "LIPASE", "AMYLASE" in the last 168 hours. No results for input(s): "AMMONIA" in the last 168 hours. Coagulation Profile: No results for input(s): "INR", "PROTIME" in the last 168 hours. Cardiac Enzymes: No results for input(s): "CKTOTAL", "CKMB", "CKMBINDEX", "TROPONINI" in the last 168 hours. BNP (last 3 results) No results for input(s): "PROBNP" in the last 8760 hours. HbA1C: No results for input(s): "HGBA1C" in the last 72 hours. CBG: Recent Labs  Lab 07/22/23 1259 07/22/23 1546 07/22/23 2054 07/22/23 2352 07/23/23 0503  GLUCAP 102* 116* 171* 159* 153*   Lipid Profile: No results for input(s): "CHOL", "HDL", "LDLCALC", "TRIG", "CHOLHDL", "LDLDIRECT" in the last 72 hours. Thyroid Function Tests: Recent Labs    07/21/23 0407  TSH 1.134   Anemia Panel: No results for input(s): "VITAMINB12", "FOLATE", "FERRITIN", "TIBC", "IRON", "RETICCTPCT" in the last 72 hours. Sepsis Labs: Recent Labs  Lab 07/21/23 0027 07/21/23 0225 07/21/23 0407 07/21/23 0800  PROCALCITON  --   --  0.82  --   LATICACIDVEN 4.9* 4.0* 3.1* 2.3*    Recent Results (from the past 240 hours)  Blood Culture (routine x 2)     Status: Abnormal (Preliminary result)   Collection Time: 07/20/23 11:39 PM   Specimen: BLOOD RIGHT HAND  Result Value Ref Range Status   Specimen Description BLOOD RIGHT HAND  Final   Special Requests   Final    BOTTLES DRAWN AEROBIC AND ANAEROBIC Blood Culture adequate volume    Culture  Setup Time   Final    GRAM POSITIVE COCCI IN CLUSTERS IN BOTH AEROBIC AND ANAEROBIC BOTTLES CRITICAL RESULT CALLED TO, READ BACK BY AND VERIFIED WITH: PHARMD E. PAYTES [id number] @ 1937 FH    Culture (A)  Final    STAPHYLOCOCCUS HOMINIS THE SIGNIFICANCE OF ISOLATING THIS ORGANISM FROM A SINGLE SET OF BLOOD CULTURES WHEN MULTIPLE SETS ARE DRAWN IS UNCERTAIN. PLEASE NOTIFY THE MICROBIOLOGY DEPARTMENT WITHIN ONE WEEK IF SPECIATION AND SENSITIVITIES ARE REQUIRED. Performed at [facility], [address], [place], [place] [address]    Report Status PENDING  Incomplete  Blood Culture ID Panel (Reflexed)     Status: Abnormal   Collection Time: 07/20/23 11:39 PM  Result Value Ref Range Status   Enterococcus faecalis NOT DETECTED NOT DETECTED Final   Enterococcus Faecium NOT DETECTED NOT DETECTED Final   Listeria monocytogenes NOT DETECTED NOT DETECTED Final   Staphylococcus species DETECTED (A) NOT DETECTED Final    Comment: CRITICAL RESULT CALLED TO, READ BACK BY AND VERIFIED WITH: PHARMD E. PAYTES [id number] @ 1937 FH    Staphylococcus aureus (BCID) NOT DETECTED NOT DETECTED Final   Staphylococcus epidermidis NOT DETECTED NOT DETECTED Final   Staphylococcus lugdunensis NOT DETECTED NOT DETECTED Final   Streptococcus species NOT DETECTED NOT DETECTED Final   Streptococcus agalactiae NOT DETECTED NOT DETECTED Final   Streptococcus pneumoniae NOT DETECTED NOT DETECTED Final   Streptococcus pyogenes NOT DETECTED NOT DETECTED Final   A.calcoaceticus-baumannii NOT DETECTED NOT DETECTED Final   Bacteroides fragilis NOT DETECTED NOT DETECTED Final   Enterobacterales NOT DETECTED NOT DETECTED Final   Enterobacter cloacae complex NOT DETECTED NOT DETECTED Final   Escherichia coli NOT DETECTED NOT DETECTED Final   Klebsiella aerogenes NOT DETECTED NOT DETECTED Final   Klebsiella oxytoca NOT DETECTED NOT DETECTED Final   Klebsiella pneumoniae NOT DETECTED NOT DETECTED Final   Proteus  species NOT DETECTED NOT DETECTED Final   Salmonella species NOT  DETECTED NOT DETECTED Final   Serratia marcescens NOT DETECTED NOT DETECTED Final   Haemophilus influenzae NOT DETECTED NOT DETECTED Final   Neisseria meningitidis NOT DETECTED NOT DETECTED Final   Pseudomonas aeruginosa NOT DETECTED NOT DETECTED Final   Stenotrophomonas maltophilia NOT DETECTED NOT DETECTED Final   Candida albicans NOT DETECTED NOT DETECTED Final   Candida auris NOT DETECTED NOT DETECTED Final   Candida glabrata NOT DETECTED NOT DETECTED Final   Candida krusei NOT DETECTED NOT DETECTED Final   Candida parapsilosis NOT DETECTED NOT DETECTED Final   Candida tropicalis NOT DETECTED NOT DETECTED Final   Cryptococcus neoformans/gattii NOT DETECTED NOT DETECTED Final    Comment: Performed at [facility], [address], [place], [place] [address]  Blood Culture (routine x 2)     Status: None (Preliminary result)   Collection Time: 07/20/23 11:43 PM   Specimen: BLOOD  Result Value Ref Range Status   Specimen Description BLOOD BLOOD LEFT ARM  Final   Special Requests   Final    BOTTLES DRAWN AEROBIC AND ANAEROBIC Blood Culture adequate volume   Culture   Final    NO GROWTH 1 DAY Performed at [facility], [address], [place], [place] [address]    Report Status PENDING  Incomplete  Resp panel by RT-PCR (RSV, Flu A&B, Covid) Anterior Nasal Swab     Status: None   Collection Time: 07/20/23 11:43 PM   Specimen: Anterior Nasal Swab  Result Value Ref Range Status   SARS Coronavirus 2 by RT PCR NEGATIVE NEGATIVE Final   Influenza A by PCR NEGATIVE NEGATIVE Final   Influenza B by PCR NEGATIVE NEGATIVE Final    Comment: (NOTE) The Xpert Xpress SARS-CoV-2/FLU/RSV plus assay is intended as an aid in the diagnosis of influenza from Nasopharyngeal swab specimens and should not be used as a sole basis for treatment. Nasal washings and aspirates are unacceptable for Xpert Xpress  SARS-CoV-2/FLU/RSV testing.  Fact Sheet for Patients: BloggerCourse.com  Fact Sheet for Healthcare Providers: SeriousBroker.it  This test is not yet approved or cleared by the [place] FDA and has been authorized for detection and/or diagnosis of SARS-CoV-2 by FDA under an Emergency Use Authorization (EUA). This EUA will remain in effect (meaning this test can be used) for the duration of the COVID-19 declaration under Section 564(b)(1) of the Act, 21 U.S.C. section 360bbb-3(b)(1), unless the authorization is terminated or revoked.     Resp Syncytial Virus by PCR NEGATIVE NEGATIVE Final    Comment: (NOTE) Fact Sheet for Patients: BloggerCourse.com  Fact Sheet for Healthcare Providers: SeriousBroker.it  This test is not yet approved or cleared by the [place] FDA and has been authorized for detection and/or diagnosis of SARS-CoV-2 by FDA under an Emergency Use Authorization (EUA). This EUA will remain in effect (meaning this test can be used) for the duration of the COVID-19 declaration under Section 564(b)(1) of the Act, 21 U.S.C. section 360bbb-3(b)(1), unless the authorization is terminated or revoked.  Performed at [facility], [address] [place], [place] [address]          Radiology Studies: No results found.       Scheduled Meds:  insulin aspart  0-9 Units Subcutaneous Q4H   rivaroxaban  10 mg Oral Daily   thiamine  100 mg Per Tube Daily   Continuous Infusions:  azithromycin 500 mg (07/23/23 0119)   cefTRIAXone (ROCEPHIN)  IV Stopped (07/23/23 0112)   feeding supplement (GLUCERNA  1.5 CAL) 45 mL/hr at 07/22/23 1832          Glade Lloyd, MD [facility] 07/23/2023, 6:46 AM

## 2023-07-23 NOTE — Plan of Care (Signed)

## 2023-07-24 DIAGNOSIS — Z515 Encounter for palliative care: Secondary | ICD-10-CM | POA: Diagnosis not present

## 2023-07-24 DIAGNOSIS — R6520 Severe sepsis without septic shock: Secondary | ICD-10-CM | POA: Diagnosis not present

## 2023-07-24 DIAGNOSIS — A419 Sepsis, unspecified organism: Secondary | ICD-10-CM | POA: Diagnosis not present

## 2023-07-24 DIAGNOSIS — J189 Pneumonia, unspecified organism: Secondary | ICD-10-CM | POA: Diagnosis not present

## 2023-07-24 DIAGNOSIS — Z66 Do not resuscitate: Secondary | ICD-10-CM

## 2023-07-24 DIAGNOSIS — Z7189 Other specified counseling: Secondary | ICD-10-CM

## 2023-07-24 LAB — BASIC METABOLIC PANEL
Anion gap: 10 (ref 5–15)
BUN: 30 mg/dL — ABNORMAL HIGH (ref 8–23)
CO2: 30 mmol/L (ref 22–32)
Calcium: 9 mg/dL (ref 8.9–10.3)
Chloride: 109 mmol/L (ref 98–111)
Creatinine, Ser: 0.71 mg/dL (ref 0.44–1.00)
GFR, Estimated: >60 mL/min (ref >60)
Glucose, Bld: 276 mg/dL — ABNORMAL HIGH (ref 70–99)
Potassium: 3.3 mmol/L — ABNORMAL LOW (ref 3.5–5.1)
Sodium: 149 mmol/L — ABNORMAL HIGH (ref 135–145)

## 2023-07-24 LAB — CBC WITH DIFFERENTIAL/PLATELET
Abs Immature Granulocytes: 0.1 10*3/uL — ABNORMAL HIGH (ref 0.00–0.07)
Basophils Absolute: 0 10*3/uL (ref 0.0–0.1)
Basophils Relative: 0 %
Eosinophils Absolute: 0.1 10*3/uL (ref 0.0–0.5)
Eosinophils Relative: 1 %
HCT: 31.6 % — ABNORMAL LOW (ref 36.0–46.0)
Hemoglobin: 9.5 g/dL — ABNORMAL LOW (ref 12.0–15.0)
Immature Granulocytes: 1 %
Lymphocytes Relative: 5 %
Lymphs Abs: 0.5 10*3/uL — ABNORMAL LOW (ref 0.7–4.0)
MCH: 28.8 pg (ref 26.0–34.0)
MCHC: 30.1 g/dL (ref 30.0–36.0)
MCV: 95.8 fL (ref 80.0–100.0)
Monocytes Absolute: 0.8 10*3/uL (ref 0.1–1.0)
Monocytes Relative: 7 %
Neutro Abs: 9.5 10*3/uL — ABNORMAL HIGH (ref 1.7–7.7)
Neutrophils Relative %: 86 %
Platelets: 162 10*3/uL (ref 150–400)
RBC: 3.3 MIL/uL — ABNORMAL LOW (ref 3.87–5.11)
RDW: 17.3 % — ABNORMAL HIGH (ref 11.5–15.5)
WBC: 11 10*3/uL — ABNORMAL HIGH (ref 4.0–10.5)
nRBC: 0 % (ref 0.0–0.2)

## 2023-07-24 LAB — GLUCOSE, CAPILLARY
Glucose-Capillary: 172 mg/dL — ABNORMAL HIGH (ref 70–99)
Glucose-Capillary: 187 mg/dL — ABNORMAL HIGH (ref 70–99)
Glucose-Capillary: 197 mg/dL — ABNORMAL HIGH (ref 70–99)
Glucose-Capillary: 210 mg/dL — ABNORMAL HIGH (ref 70–99)
Glucose-Capillary: 211 mg/dL — ABNORMAL HIGH (ref 70–99)
Glucose-Capillary: 215 mg/dL — ABNORMAL HIGH (ref 70–99)
Glucose-Capillary: 239 mg/dL — ABNORMAL HIGH (ref 70–99)
Glucose-Capillary: 260 mg/dL — ABNORMAL HIGH (ref 70–99)
Glucose-Capillary: 303 mg/dL — ABNORMAL HIGH (ref 70–99)

## 2023-07-24 LAB — MAGNESIUM: Magnesium: 2.2 mg/dL (ref 1.7–2.4)

## 2023-07-24 MED ORDER — FREE WATER
120.0000 mL | Status: DC
  Administered 2023-07-24 – 2023-07-27 (×20): 120 mL

## 2023-07-24 MED ORDER — CARVEDILOL 6.25 MG PO TABS
6.2500 mg | ORAL_TABLET | Freq: Two times a day (BID) | ORAL | Status: DC
  Administered 2023-07-24 – 2023-07-27 (×7): 6.25 mg
  Filled 2023-07-24 (×7): qty 1

## 2023-07-24 MED ORDER — INSULIN ASPART 100 UNIT/ML IJ SOLN
2.0000 [IU] | INTRAMUSCULAR | Status: DC
  Administered 2023-07-24 – 2023-07-27 (×19): 2 [IU] via SUBCUTANEOUS

## 2023-07-24 MED ORDER — POTASSIUM CL IN DEXTROSE 5% 20 MEQ/L IV SOLN
20.0000 meq | INTRAVENOUS | Status: DC
  Administered 2023-07-24 – 2023-07-25 (×2): 20 meq via INTRAVENOUS
  Filled 2023-07-24 (×2): qty 1000

## 2023-07-24 MED ORDER — LEVETIRACETAM 100 MG/ML PO SOLN
500.0000 mg | Freq: Two times a day (BID) | ORAL | Status: DC
  Administered 2023-07-24 – 2023-07-27 (×7): 500 mg
  Filled 2023-07-24 (×8): qty 5

## 2023-07-24 NOTE — TOC Progression Note (Signed)
Transition of Care [facility]) - Progression Note    Patient Details  Name: Gina Kelley MRN: [id number] Date of Birth: [birth year]  Transition of Care [facility]) CM/SW Contact  Erin Sons, [place] Phone Number: 07/24/2023, 1:33 PM  Clinical Narrative:     CSW has confirmed patient is LTC at [name]. PMT is consulted. TOC will continue to follow.          Social Determinants of Health (SDOH) Interventions SDOH Screenings   Food Insecurity: Patient Unable To Answer (07/21/2023)  Housing: Patient Unable To Answer (07/21/2023)  Transportation Needs: Patient Unable To Answer (07/21/2023)  Utilities: Patient Unable To Answer (07/21/2023)  Depression (PHQ2-9): Low Risk  (11/15/2021)  Social Connections: Patient Unable To Answer (07/21/2023)  Tobacco Use: Medium Risk (07/21/2023)    Readmission Risk Interventions     No data to display

## 2023-07-24 NOTE — Progress Notes (Signed)
PROGRESS NOTE    Gina Kelley  WUJ:[id number] DOB: 1935/12/22 DOA: 07/20/2023 PCP: System, Provider Not In   Brief Narrative:  88 y.o. female with medical history significant for PUD, iron deficiency anemia, subdural hematoma with residual left hemiparesis and dysarthria, severe dysphagia with PEG dependency, chronic hypoxic respiratory failure on 4 L [place], and GI bleed presented from her [name] due to abnormal labs along with increased confusion and hypotension.  As per daughter, at baseline, patient is not conversational but her eyes are usually open, she is able to track and occasionally moves her right leg.  On presentation, sodium was 157, creatinine of 1.41, WBC of 18.8.  Chest x-ray showed right basilar consolidation suspicious for acute lobar pneumonia.  She was started on IV fluids and antibiotics.  Perative care consulted for goals of care discussion.  Assessment & Plan:   Severe sepsis: Present on admission Acute metabolic encephalopathy Community-acquired pneumonia UTI: Present on admission Lactic acidosis: Improving Leukocytosis -Presented with altered mental status with evidence of pneumonia on chest x-ray and UTI along with leukocytosis/tachycardia/fever/tachypnea/lactic acidosis/AKI -Continue Rocephin and Zithromax.  COVID/RSV/influenza PCR negative on presentation. -Blood cultures grew Staph hominis: Most likely contaminant.  Urine cultures pending - No temperature spikes over the last few days. -IV fluid as below -WBCs are only slightly elevated.  Hypernatremia -Possibly from dehydration -Sodium 157 at [name].  Treated with IV fluids. Sodium 149 this morning.  Continue gentle hydration.  Monitor  Chronic respiratory failure with hypoxia -Normally on 4 L oxygen via nasal cannula.  Currently stable.  Continue oxygen supplementation  Elevated LFTs -Patient has chronically elevated LFTs.  Questionable cause.  Monitor intermittently  Hypokalemia -Continue replacement.   Monitor.  Anemia of chronic disease/iron deficiency anemia -Hemoglobin currently stable.  Monitor intermittently.  Diabetes mellitus type 2 with hyperglycemia -Continue CBGs with SSI.  History of intracranial hemorrhage/subdural hematoma s/p craniotomy with dysphagia/dysarthria and left-sided hemiparesis -Has been PEG tube dependent for a year now.  Tube feeds have been resumed as per dietitian recommendations.  Hypertension -Blood pressure intermittently elevated.  Antihypertensives on hold.  resume antihypertensives.  Hyperlipidemia GERD Depression Seizure disorder -Will resume meds via tube  Physical deconditioning -PT eval  Goals of care -Palliative care consultation is still pending.  Prognosis very poor.    DVT prophylaxis: Xarelto Code Status: DNR Family Communication: None at bedside Disposition Plan: Status is: Inpatient Remains inpatient appropriate because: Of severity of illness    Consultants: Palliative care  Procedures: None  Antimicrobials:  Anti-infectives (From admission, onward)    Start     Dose/Rate Route Frequency Ordered Stop   07/22/23 0200  azithromycin (ZITHROMAX) 500 mg in sodium chloride 0.9 % 250 mL IVPB        500 mg 250 mL/hr over 60 Minutes Intravenous Every 24 hours 07/21/23 0334 07/26/23 0159   07/22/23 0000  cefTRIAXone (ROCEPHIN) 1 g in sodium chloride 0.9 % 100 mL IVPB        1 g 200 mL/hr over 30 Minutes Intravenous Every 24 hours 07/21/23 0334     07/21/23 0045  cefTRIAXone (ROCEPHIN) 1 g in sodium chloride 0.9 % 100 mL IVPB        1 g 200 mL/hr over 30 Minutes Intravenous  Once 07/21/23 0038 07/21/23 0119   07/21/23 0045  azithromycin (ZITHROMAX) 500 mg in sodium chloride 0.9 % 250 mL IVPB        500 mg 250 mL/hr over 60 Minutes Intravenous  Once 07/21/23 0038 07/21/23 0150  Subjective: Patient seen and examined at bedside.  Extremely poor historian.  Still does not follow commands.  No seizures, fever,  agitation or vomiting reported. Objective: Vitals:   07/23/23 2010 07/24/23 0451 07/24/23 0530 07/24/23 0721  BP: (!) 148/79 (!) 159/77  (!) 158/78  Pulse: 89 (!) 107 (!) 101 96  Resp: 18 18  18   Temp: 98.3 F (36.8 C) 98.3 F (36.8 C)  97.9 F (36.6 C)  TempSrc:    Oral  SpO2: 100% 92% 100% 100%  Weight:      Height:        Intake/Output Summary (Last 24 hours) at 07/24/2023 0840 Last data filed at 07/24/2023 0981 Gross per 24 hour  Intake 2358.67 ml  Output 1000 ml  Net 1358.67 ml   Filed Weights   07/20/23 2325 07/21/23 1300  Weight: 54 kg 48.8 kg    Examination:  General: Chronically ill and deconditioned looking.  No distress.  On 2 L oxygen by nasal cannula.   ENT/neck: No JVD elevation or palpable neck masses noted respiratory: Decreased breath sounds at bases with scattered crackles  CVS: Mild intermittent tachycardia present; S1 and S2 are heard  abdominal: Soft, nontender, distended mildly; no organomegaly,  bowel sounds are heard normally.  Peg tube present Extremities: No clubbing; mild lower extremity edema present CNS: Still does not follow commands and remains confused Lymph: No obvious palpable lymphadenopathy Skin: No obvious rashes/petechiae psych: Mostly flat affect.  Not agitated currently.  Musculoskeletal: Contracted extremities present     Data Reviewed: I have personally reviewed following labs and imaging studies  CBC: Recent Labs  Lab 07/20/23 2341 07/21/23 0407 07/23/23 0327 07/24/23 0608  WBC 23.2* 20.1* 10.6* 11.0*  NEUTROABS 19.9*  --  8.6* 9.5*  HGB 10.0* 8.1* 7.9* 9.5*  HCT 31.8* 28.2* 26.4* 31.6*  MCV 98.1 102.5* 97.4 95.8  PLT 185 134* 137* 162   Basic Metabolic Panel: Recent Labs  Lab 07/20/23 2341 07/21/23 0144 07/21/23 0407 07/21/23 0622 07/22/23 0718 07/22/23 0841 07/22/23 1104 07/23/23 0327 07/24/23 0608  NA 153* 153* 156*   < > 143 142 143 148* 149*  K 4.4 3.5 3.8  --   --   --   --  3.3* 3.3*  CL 111 114*  116*  --   --   --   --  111 109  CO2 29 25 27   --   --   --   --  31 30  GLUCOSE 195* 170* 211*  --   --   --   --  171* 276*  BUN 120* 114* 112*  --   --   --   --  57* 30*  CREATININE 1.75* 1.67* 1.70*  --   --   --   --  0.83 0.71  CALCIUM 9.2 8.5* 8.3*  --   --   --   --  8.4* 9.0  MG  --   --   --   --   --   --   --  2.9* 2.2  PHOS  --   --   --   --   --   --   --  2.6  --    < > = values in this interval not displayed.   GFR: Estimated Creatinine Clearance: 39.6 mL/min (by C-G formula based on SCr of 0.71 mg/dL). Liver Function Tests: Recent Labs  Lab 07/20/23 2341 07/23/23 0327  AST 100* 177*  ALT 86* 118*  ALKPHOS 79 78  BILITOT 0.8 0.3  PROT 6.9 6.2*  ALBUMIN 1.9* <1.5*   No results for input(s): "LIPASE", "AMYLASE" in the last 168 hours. No results for input(s): "AMMONIA" in the last 168 hours. Coagulation Profile: No results for input(s): "INR", "PROTIME" in the last 168 hours. Cardiac Enzymes: No results for input(s): "CKTOTAL", "CKMB", "CKMBINDEX", "TROPONINI" in the last 168 hours. BNP (last 3 results) No results for input(s): "PROBNP" in the last 8760 hours. HbA1C: No results for input(s): "HGBA1C" in the last 72 hours. CBG: Recent Labs  Lab 07/23/23 1719 07/23/23 2011 07/24/23 0008 07/24/23 0454 07/24/23 0728  GLUCAP 189* 176* 172* 260* 303*   Lipid Profile: No results for input(s): "CHOL", "HDL", "LDLCALC", "TRIG", "CHOLHDL", "LDLDIRECT" in the last 72 hours. Thyroid Function Tests: No results for input(s): "TSH", "T4TOTAL", "FREET4", "T3FREE", "THYROIDAB" in the last 72 hours.  Anemia Panel: No results for input(s): "VITAMINB12", "FOLATE", "FERRITIN", "TIBC", "IRON", "RETICCTPCT" in the last 72 hours. Sepsis Labs: Recent Labs  Lab 07/21/23 0027 07/21/23 0225 07/21/23 0407 07/21/23 0800  PROCALCITON  --   --  0.82  --   LATICACIDVEN 4.9* 4.0* 3.1* 2.3*    Recent Results (from the past 240 hours)  Blood Culture (routine x 2)      Status: Abnormal   Collection Time: 07/20/23 11:39 PM   Specimen: BLOOD RIGHT HAND  Result Value Ref Range Status   Specimen Description BLOOD RIGHT HAND  Final   Special Requests   Final    BOTTLES DRAWN AEROBIC AND ANAEROBIC Blood Culture adequate volume   Culture  Setup Time   Final    GRAM POSITIVE COCCI IN CLUSTERS IN BOTH AEROBIC AND ANAEROBIC BOTTLES CRITICAL RESULT CALLED TO, READ BACK BY AND VERIFIED WITH: PHARMD E. PAYTES [id number] @ 1935 FH    Culture (A)  Final    STAPHYLOCOCCUS HOMINIS THE SIGNIFICANCE OF ISOLATING THIS ORGANISM FROM A SINGLE SET OF BLOOD CULTURES WHEN MULTIPLE SETS ARE DRAWN IS UNCERTAIN. PLEASE NOTIFY THE MICROBIOLOGY DEPARTMENT WITHIN ONE WEEK IF SPECIATION AND SENSITIVITIES ARE REQUIRED. Performed at [facility], [address], [place], [place] [address]    Report Status 07/23/2023 FINAL  Final  Blood Culture ID Panel (Reflexed)     Status: Abnormal   Collection Time: 07/20/23 11:39 PM  Result Value Ref Range Status   Enterococcus faecalis NOT DETECTED NOT DETECTED Final   Enterococcus Faecium NOT DETECTED NOT DETECTED Final   Listeria monocytogenes NOT DETECTED NOT DETECTED Final   Staphylococcus species DETECTED (A) NOT DETECTED Final    Comment: CRITICAL RESULT CALLED TO, READ BACK BY AND VERIFIED WITH: PHARMD E. PAYTES [id number] @ 1935 FH    Staphylococcus aureus (BCID) NOT DETECTED NOT DETECTED Final   Staphylococcus epidermidis NOT DETECTED NOT DETECTED Final   Staphylococcus lugdunensis NOT DETECTED NOT DETECTED Final   Streptococcus species NOT DETECTED NOT DETECTED Final   Streptococcus agalactiae NOT DETECTED NOT DETECTED Final   Streptococcus pneumoniae NOT DETECTED NOT DETECTED Final   Streptococcus pyogenes NOT DETECTED NOT DETECTED Final   A.calcoaceticus-baumannii NOT DETECTED NOT DETECTED Final   Bacteroides fragilis NOT DETECTED NOT DETECTED Final   Enterobacterales NOT DETECTED NOT DETECTED Final   Enterobacter cloacae  complex NOT DETECTED NOT DETECTED Final   Escherichia coli NOT DETECTED NOT DETECTED Final   Klebsiella aerogenes NOT DETECTED NOT DETECTED Final   Klebsiella oxytoca NOT DETECTED NOT DETECTED Final   Klebsiella pneumoniae NOT DETECTED NOT DETECTED Final   Proteus species  NOT DETECTED NOT DETECTED Final   Salmonella species NOT DETECTED NOT DETECTED Final   Serratia marcescens NOT DETECTED NOT DETECTED Final   Haemophilus influenzae NOT DETECTED NOT DETECTED Final   Neisseria meningitidis NOT DETECTED NOT DETECTED Final   Pseudomonas aeruginosa NOT DETECTED NOT DETECTED Final   Stenotrophomonas maltophilia NOT DETECTED NOT DETECTED Final   Candida albicans NOT DETECTED NOT DETECTED Final   Candida auris NOT DETECTED NOT DETECTED Final   Candida glabrata NOT DETECTED NOT DETECTED Final   Candida krusei NOT DETECTED NOT DETECTED Final   Candida parapsilosis NOT DETECTED NOT DETECTED Final   Candida tropicalis NOT DETECTED NOT DETECTED Final   Cryptococcus neoformans/gattii NOT DETECTED NOT DETECTED Final    Comment: Performed at [facility], [address] [place], [place] [address]  Blood Culture (routine x 2)     Status: None (Preliminary result)   Collection Time: 07/20/23 11:43 PM   Specimen: BLOOD  Result Value Ref Range Status   Specimen Description BLOOD BLOOD LEFT ARM  Final   Special Requests   Final    BOTTLES DRAWN AEROBIC AND ANAEROBIC Blood Culture adequate volume   Culture   Final    NO GROWTH 2 DAYS Performed at [facility], [address], [place], [place] [address]    Report Status PENDING  Incomplete  Resp panel by RT-PCR (RSV, Flu A&B, Covid) Anterior Nasal Swab     Status: None   Collection Time: 07/20/23 11:43 PM   Specimen: Anterior Nasal Swab  Result Value Ref Range Status   SARS Coronavirus 2 by RT PCR NEGATIVE NEGATIVE Final   Influenza A by PCR NEGATIVE NEGATIVE Final   Influenza B by PCR NEGATIVE NEGATIVE Final    Comment: (NOTE) The  Xpert Xpress SARS-CoV-2/FLU/RSV plus assay is intended as an aid in the diagnosis of influenza from Nasopharyngeal swab specimens and should not be used as a sole basis for treatment. Nasal washings and aspirates are unacceptable for Xpert Xpress SARS-CoV-2/FLU/RSV testing.  Fact Sheet for Patients: BloggerCourse.com  Fact Sheet for Healthcare Providers: SeriousBroker.it  This test is not yet approved or cleared by the [place] FDA and has been authorized for detection and/or diagnosis of SARS-CoV-2 by FDA under an Emergency Use Authorization (EUA). This EUA will remain in effect (meaning this test can be used) for the duration of the COVID-19 declaration under Section 564(b)(1) of the Act, 21 U.S.C. section 360bbb-3(b)(1), unless the authorization is terminated or revoked.     Resp Syncytial Virus by PCR NEGATIVE NEGATIVE Final    Comment: (NOTE) Fact Sheet for Patients: BloggerCourse.com  Fact Sheet for Healthcare Providers: SeriousBroker.it  This test is not yet approved or cleared by the [place] FDA and has been authorized for detection and/or diagnosis of SARS-CoV-2 by FDA under an Emergency Use Authorization (EUA). This EUA will remain in effect (meaning this test can be used) for the duration of the COVID-19 declaration under Section 564(b)(1) of the Act, 21 U.S.C. section 360bbb-3(b)(1), unless the authorization is terminated or revoked.  Performed at [facility], [address] [place], [place] [address]          Radiology Studies: No results found.       Scheduled Meds:  insulin aspart  0-9 Units Subcutaneous Q4H   rivaroxaban  10 mg Oral Daily   thiamine  100 mg Per Tube Daily   Continuous Infusions:  azithromycin 500 mg (07/24/23 0225)   cefTRIAXone (ROCEPHIN)  IV 1 g (07/24/23 0114)   feeding supplement (GLUCERNA 1.5 CAL) 1,000  mL (07/23/23 1654)          Glade Lloyd, MD [facility] 07/24/2023, 8:40 AM

## 2023-07-24 NOTE — Plan of Care (Signed)

## 2023-07-24 NOTE — Consult Note (Signed)
Palliative Care Consult Note                                  Date: 07/24/2023   Patient Name: Gina Kelley  DOB: February 10, 1936  MRN: [id number]  Age / Sex: 88 y.o., female  PCP: System, Provider Not In Referring Physician: Glade Lloyd, MD  Reason for Consultation: {Reason for Consult:23484}  HPI/Patient Profile: 88 y.o. female  with past medical history of *** admitted on 07/20/2023 with ***.   Past Medical History:  Diagnosis Date   Anemia of chronic disease 07/08/2006   Antral ulcer 02/23/2007   Seen on EGD in 2008, small ulcer with erosion    Cerebral vascular accident (HCC) 04/14/2006   1998, left lower extremity numbness, no residual deficits    Essential hypertension 04/14/2006   Gastroesophageal reflux disease 02/18/2013   Occasional, symptomatically relieved with peptobismol    Healthcare maintenance 12/04/2011   Hyperlipidemia LDL goal < 100 04/14/2006   Hypertensive retinopathy of both eyes, grade 1 05/15/2016   Osteopenia of right femoral neck 10/20/2016   DEXA (10/16/2016): R femur T -2.5 (FRAX tool calculates at -2.4), L1-L4 spine T -0.9, 10 year risk for: Major osteoporotic fracture 8.3%, Hip fracture 2.7%   Seizure disorder (HCC) 03/01/2023   Type 2 diabetes mellitus with stage 1 chronic kidney disease, without long-term current use of insulin (HCC)    Type II diabetes mellitus (HCC) 04/14/2006    Subjective:   This NP Wynne Dust reviewed medical records, received report from team, assessed the patient and then meet at the patient's bedside to discuss diagnosis, prognosis, [name], EOL wishes disposition and options.  I met with ***.   We meet to discuss diagnosis prognosis, [name], EOL wishes, disposition and options. Concept of Palliative Care was introduced as specialized medical care for people and their families living with serious illness.  If focuses on providing relief from the symptoms and stress of a serious  illness.  The goal is to improve quality of life for both the patient and the family. Values and goals of care important to patient and family were attempted to be elicited.  ***  Created space and opportunity for patient  and family to explore thoughts and feelings regarding current medical situation   Natural trajectory and current clinical status were discussed. Questions and concerns addressed. Patient  encouraged to call with questions or concerns.    Patient/Family Understanding of Illness: ***  Life Review: ***  Patient Values: ***  Goals: ***  Today's Discussion: ***  Review of Systems  Objective:   Primary Diagnoses: Present on Admission:  Severe sepsis (HCC)   Physical Exam  Vital Signs:  BP (!) 158/78 (BP Location: Left Arm)   Pulse 96   Temp 97.9 F (36.6 C) (Oral)   Resp 18   Ht 5\' 4"  (1.626 m)   Wt 48.8 kg   SpO2 100%   BMI 18.47 kg/m   Palliative Assessment/Data: ***    Advanced Care Planning:   Existing Vynca/ACP Documentation: ***  Primary Decision Maker: {Primary Decision GEXBM:84132}  Code Status/Advance Care Planning: {Palliative Code status:23503}  A discussion was had today regarding advanced directives. Concepts specific to code status, artifical feeding and hydration, continued IV antibiotics and rehospitalization was had.  The difference between a aggressive medical intervention path and a palliative comfort care path for this patient at this time was had. ***The MOST form was  introduced and discussed.***  Decisions/Changes to ACP: ***  Assessment & Plan:   Impression: ***  SUMMARY OF RECOMMENDATIONS   ***  Symptom Management:  ***  Prognosis:  {Palliative Care Prognosis:23504}  Discharge Planning:  {Palliative dispostion:23505}   Discussed with: ***    Thank you for allowing [place] to participate in the care of CAITLAN CHAUCA PMT will continue to support holistically.  Time Total: ***  Detailed review  of medical records (labs, imaging, vital signs), medically appropriate exam, discussed with treatment team, counseling and education to patient, family, & staff, documenting clinical information, medication management, coordination of care  Signed by: Wynne Dust, NP Palliative Medicine Team  Team Phone # [phone] (Nights/Weekends)  07/24/2023, 2:11 PM

## 2023-07-24 NOTE — Inpatient Diabetes Management (Signed)
Inpatient Diabetes Program Recommendations  AACE/ADA: New Consensus Statement on Inpatient Glycemic Control (2015)  Target Ranges:  Prepandial:   less than 140 mg/dL      Peak postprandial:   less than 180 mg/dL (1-2 hours)      Critically ill patients:  140 - 180 mg/dL   Lab Results  Component Value Date   GLUCAP 239 (H) 07/24/2023   HGBA1C 5.8 (H) 02/14/2023    Review of Glycemic Control  Latest Reference Range & Units 07/23/23 11:22 07/23/23 17:19 07/23/23 20:11 07/24/23 00:08 07/24/23 04:54 07/24/23 07:28 07/24/23 09:49  Glucose-Capillary 70 - 99 mg/dL 295 (H) 621 (H) 308 (H) 172 (H) 260 (H) 303 (H) 239 (H)   Diabetes history: DM 2 Outpatient Diabetes medications: Metformin 500 BID via tube, Humalog 0-16 QID  Current orders for Inpatient glycemic control:  Novolog 0-9 units Q4 hours  Glucerna 45 ml/hour  Inpatient Diabetes Program Recommendations:    -   Add Novolog 2 units Q4 hours tube feed coverage (do not give if tube feeds are stopped or held for any reason)  Thanks,  Christena Deem RN, MSN, BC-ADM Inpatient Diabetes Coordinator Team Pager [phone] (8a-5p)

## 2023-07-25 DIAGNOSIS — Z515 Encounter for palliative care: Secondary | ICD-10-CM | POA: Diagnosis not present

## 2023-07-25 DIAGNOSIS — Z7189 Other specified counseling: Secondary | ICD-10-CM | POA: Diagnosis not present

## 2023-07-25 DIAGNOSIS — Z66 Do not resuscitate: Secondary | ICD-10-CM | POA: Diagnosis not present

## 2023-07-25 DIAGNOSIS — A419 Sepsis, unspecified organism: Secondary | ICD-10-CM | POA: Diagnosis not present

## 2023-07-25 DIAGNOSIS — R6520 Severe sepsis without septic shock: Secondary | ICD-10-CM | POA: Diagnosis not present

## 2023-07-25 LAB — BASIC METABOLIC PANEL
Anion gap: 10 (ref 5–15)
BUN: 24 mg/dL — ABNORMAL HIGH (ref 8–23)
CO2: 33 mmol/L — ABNORMAL HIGH (ref 22–32)
Calcium: 8.8 mg/dL — ABNORMAL LOW (ref 8.9–10.3)
Chloride: 102 mmol/L (ref 98–111)
Creatinine, Ser: 0.65 mg/dL (ref 0.44–1.00)
GFR, Estimated: >60 mL/min (ref >60)
Glucose, Bld: 145 mg/dL — ABNORMAL HIGH (ref 70–99)
Potassium: 3.6 mmol/L (ref 3.5–5.1)
Sodium: 145 mmol/L (ref 135–145)

## 2023-07-25 LAB — GLUCOSE, CAPILLARY
Glucose-Capillary: 169 mg/dL — ABNORMAL HIGH (ref 70–99)
Glucose-Capillary: 116 mg/dL — ABNORMAL HIGH (ref 70–99)
Glucose-Capillary: 154 mg/dL — ABNORMAL HIGH (ref 70–99)
Glucose-Capillary: 96 mg/dL (ref 70–99)
Glucose-Capillary: 108 mg/dL — ABNORMAL HIGH (ref 70–99)

## 2023-07-25 LAB — MAGNESIUM: Magnesium: 2.2 mg/dL (ref 1.7–2.4)

## 2023-07-25 NOTE — Progress Notes (Signed)
PROGRESS NOTE    Gina Kelley  MVH:[id number] DOB: 1936/02/22 DOA: 07/20/2023 PCP: System, Provider Not In   Brief Narrative:  88 y.o. female with medical history significant for PUD, iron deficiency anemia, subdural hematoma with residual left hemiparesis and dysarthria, severe dysphagia with PEG dependency, chronic hypoxic respiratory failure on 4 L [place], and GI bleed presented from her [name] due to abnormal labs along with increased confusion and hypotension.  As per daughter, at baseline, patient is not conversational but her eyes are usually open, she is able to track and occasionally moves her right leg.  On presentation, sodium was 157, creatinine of 1.41, WBC of 18.8.  Chest x-ray showed right basilar consolidation suspicious for acute lobar pneumonia.  She was started on IV fluids and antibiotics.  Palliative care consulted for goals of care discussion.  Assessment & Plan:   Severe sepsis: Present on admission Acute metabolic encephalopathy Community-acquired pneumonia UTI: Present on admission Lactic acidosis: Improving Leukocytosis -Presented with altered mental status with evidence of pneumonia on chest x-ray and UTI along with leukocytosis/tachycardia/fever/tachypnea/lactic acidosis/AKI -Continue Rocephin and Zithromax.  COVID/RSV/influenza PCR negative on presentation. -Blood cultures grew Staph hominis: Most likely contaminant.  Urine cultures pending - No temperature spikes over the last few days. -IV fluid as below -WBCs pending for today.  Hypernatremia -Possibly from dehydration -Sodium 157 at [name].  Treated with IV fluids. Sodium 145 this morning.  Continue gentle hydration.  Continue free water via tube.  Monitor  Chronic respiratory failure with hypoxia -Normally on 4 L oxygen via nasal cannula.  Currently stable.  Continue oxygen supplementation  Elevated LFTs -Patient has chronically elevated LFTs.  Questionable cause.  Monitor  intermittently  Hypokalemia -Improved  Anemia of chronic disease/iron deficiency anemia -Hemoglobin currently stable.  Monitor intermittently.  Diabetes mellitus type 2 with hyperglycemia -Continue CBGs with SSI.  Continue NovoLog with tube feeds.  Diabetes coordinator following  History of intracranial hemorrhage/subdural hematoma s/p craniotomy with dysphagia/dysarthria and left-sided hemiparesis -Has been PEG tube dependent for a year now.  Tube feeds have been resumed as per dietitian recommendations.  Hypertension -Blood pressure intermittently elevated.  Continue Coreg.  Amlodipine on hold  Hyperlipidemia GERD Depression Seizure disorder -Continue Keppra.  Other medications will be resumed on discharge  Goals of care -Palliative care consultation is still pending.  Prognosis very poor.    DVT prophylaxis: Xarelto Code Status: DNR Family Communication: None at bedside Disposition Plan: Status is: Inpatient Remains inpatient appropriate because: Of severity of illness    Consultants: Palliative care  Procedures: None  Antimicrobials:  Anti-infectives (From admission, onward)    Start     Dose/Rate Route Frequency Ordered Stop   07/22/23 0200  azithromycin (ZITHROMAX) 500 mg in sodium chloride 0.9 % 250 mL IVPB        500 mg 250 mL/hr over 60 Minutes Intravenous Every 24 hours 07/21/23 0334 07/25/23 0240   07/22/23 0000  cefTRIAXone (ROCEPHIN) 1 g in sodium chloride 0.9 % 100 mL IVPB        1 g 200 mL/hr over 30 Minutes Intravenous Every 24 hours 07/21/23 0334     07/21/23 0045  cefTRIAXone (ROCEPHIN) 1 g in sodium chloride 0.9 % 100 mL IVPB        1 g 200 mL/hr over 30 Minutes Intravenous  Once 07/21/23 0038 07/21/23 0119   07/21/23 0045  azithromycin (ZITHROMAX) 500 mg in sodium chloride 0.9 % 250 mL IVPB        500 mg  250 mL/hr over 60 Minutes Intravenous  Once 07/21/23 0038 07/21/23 0150        Subjective: Patient seen and examined at bedside.   Confused, does not follow any commands.  Extremely poor historian.  No seizures, vomiting or agitation reported.   Objective: Vitals:   07/24/23 1627 07/24/23 2015 07/25/23 0358 07/25/23 0746  BP: (!) 164/82 (!) 151/82 134/71 (!) 143/69  Pulse: 88 84 86 83  Resp: 20 20 18 16   Temp: 98.3 F (36.8 C) 99.1 F (37.3 C) 98.7 F (37.1 C) 98.1 F (36.7 C)  TempSrc: Oral Oral Oral Axillary  SpO2: 100% 100% 100% 92%  Weight:      Height:        Intake/Output Summary (Last 24 hours) at 07/25/2023 0810 Last data filed at 07/25/2023 1610 Gross per 24 hour  Intake 1926.13 ml  Output 1300 ml  Net 626.13 ml   Filed Weights   07/20/23 2325 07/21/23 1300  Weight: 54 kg 48.8 kg    Examination:  General: Chronically ill and deconditioned looking.  On 4 L oxygen via nasal cannula.  No acute distress.   ENT/neck: No obvious neck masses or JVD elevation noted respiratory: Bilateral decreased breath sounds at bases with scattered crackles  CVS: S1-S2 heard; currently rate controlled  abdominal: Soft, nontender, remains slightly distended; no organomegaly, normal bowel sounds are heard.  Peg tube present Extremities: Trace lower extremity edema; no cyanosis  CNS: Hardly follows any commands and remains confused lymph: No lymphadenopathy noted  skin: No obvious ecchymosis/lesions psych: Currently not agitated.  Extremely flat affect.  Musculoskeletal: Contracted extremities present     Data Reviewed: I have personally reviewed following labs and imaging studies  CBC: Recent Labs  Lab 07/20/23 2341 07/21/23 0407 07/23/23 0327 07/24/23 0608  WBC 23.2* 20.1* 10.6* 11.0*  NEUTROABS 19.9*  --  8.6* 9.5*  HGB 10.0* 8.1* 7.9* 9.5*  HCT 31.8* 28.2* 26.4* 31.6*  MCV 98.1 102.5* 97.4 95.8  PLT 185 134* 137* 162   Basic Metabolic Panel: Recent Labs  Lab 07/21/23 0144 07/21/23 0407 07/21/23 0622 07/22/23 0841 07/22/23 1104 07/23/23 0327 07/24/23 0608 07/25/23 0620  NA 153* 156*   < >  142 143 148* 149* 145  K 3.5 3.8  --   --   --  3.3* 3.3* 3.6  CL 114* 116*  --   --   --  111 109 102  CO2 25 27  --   --   --  31 30 33*  GLUCOSE 170* 211*  --   --   --  171* 276* 145*  BUN 114* 112*  --   --   --  57* 30* 24*  CREATININE 1.67* 1.70*  --   --   --  0.83 0.71 0.65  CALCIUM 8.5* 8.3*  --   --   --  8.4* 9.0 8.8*  MG  --   --   --   --   --  2.9* 2.2 2.2  PHOS  --   --   --   --   --  2.6  --   --    < > = values in this interval not displayed.   GFR: Estimated Creatinine Clearance: 39.6 mL/min (by C-G formula based on SCr of 0.65 mg/dL). Liver Function Tests: Recent Labs  Lab 07/20/23 2341 07/23/23 0327  AST 100* 177*  ALT 86* 118*  ALKPHOS 79 78  BILITOT 0.8 0.3  PROT 6.9 6.2*  ALBUMIN 1.9* <1.5*   No results for input(s): "LIPASE", "AMYLASE" in the last 168 hours. No results for input(s): "AMMONIA" in the last 168 hours. Coagulation Profile: No results for input(s): "INR", "PROTIME" in the last 168 hours. Cardiac Enzymes: No results for input(s): "CKTOTAL", "CKMB", "CKMBINDEX", "TROPONINI" in the last 168 hours. BNP (last 3 results) No results for input(s): "PROBNP" in the last 8760 hours. HbA1C: No results for input(s): "HGBA1C" in the last 72 hours. CBG: Recent Labs  Lab 07/24/23 1823 07/24/23 2013 07/24/23 2333 07/25/23 0356 07/25/23 0757  GLUCAP 187* 197* 211* 169* 116*   Lipid Profile: No results for input(s): "CHOL", "HDL", "LDLCALC", "TRIG", "CHOLHDL", "LDLDIRECT" in the last 72 hours. Thyroid Function Tests: No results for input(s): "TSH", "T4TOTAL", "FREET4", "T3FREE", "THYROIDAB" in the last 72 hours.  Anemia Panel: No results for input(s): "VITAMINB12", "FOLATE", "FERRITIN", "TIBC", "IRON", "RETICCTPCT" in the last 72 hours. Sepsis Labs: Recent Labs  Lab 07/21/23 0027 07/21/23 0225 07/21/23 0407 07/21/23 0800  PROCALCITON  --   --  0.82  --   LATICACIDVEN 4.9* 4.0* 3.1* 2.3*    Recent Results (from the past 240 hours)   Blood Culture (routine x 2)     Status: Abnormal   Collection Time: 07/20/23 11:39 PM   Specimen: BLOOD RIGHT HAND  Result Value Ref Range Status   Specimen Description BLOOD RIGHT HAND  Final   Special Requests   Final    BOTTLES DRAWN AEROBIC AND ANAEROBIC Blood Culture adequate volume   Culture  Setup Time   Final    GRAM POSITIVE COCCI IN CLUSTERS IN BOTH AEROBIC AND ANAEROBIC BOTTLES CRITICAL RESULT CALLED TO, READ BACK BY AND VERIFIED WITH: PHARMD E. PAYTES [id number] @ 1937 FH    Culture (A)  Final    STAPHYLOCOCCUS HOMINIS THE SIGNIFICANCE OF ISOLATING THIS ORGANISM FROM A SINGLE SET OF BLOOD CULTURES WHEN MULTIPLE SETS ARE DRAWN IS UNCERTAIN. PLEASE NOTIFY THE MICROBIOLOGY DEPARTMENT WITHIN ONE WEEK IF SPECIATION AND SENSITIVITIES ARE REQUIRED. Performed at [facility], [address], [place], [place] [address]    Report Status 07/23/2023 FINAL  Final  Blood Culture ID Panel (Reflexed)     Status: Abnormal   Collection Time: 07/20/23 11:39 PM  Result Value Ref Range Status   Enterococcus faecalis NOT DETECTED NOT DETECTED Final   Enterococcus Faecium NOT DETECTED NOT DETECTED Final   Listeria monocytogenes NOT DETECTED NOT DETECTED Final   Staphylococcus species DETECTED (A) NOT DETECTED Final    Comment: CRITICAL RESULT CALLED TO, READ BACK BY AND VERIFIED WITH: PHARMD E. PAYTES [id number] @ 1937 FH    Staphylococcus aureus (BCID) NOT DETECTED NOT DETECTED Final   Staphylococcus epidermidis NOT DETECTED NOT DETECTED Final   Staphylococcus lugdunensis NOT DETECTED NOT DETECTED Final   Streptococcus species NOT DETECTED NOT DETECTED Final   Streptococcus agalactiae NOT DETECTED NOT DETECTED Final   Streptococcus pneumoniae NOT DETECTED NOT DETECTED Final   Streptococcus pyogenes NOT DETECTED NOT DETECTED Final   A.calcoaceticus-baumannii NOT DETECTED NOT DETECTED Final   Bacteroides fragilis NOT DETECTED NOT DETECTED Final   Enterobacterales NOT DETECTED NOT DETECTED  Final   Enterobacter cloacae complex NOT DETECTED NOT DETECTED Final   Escherichia coli NOT DETECTED NOT DETECTED Final   Klebsiella aerogenes NOT DETECTED NOT DETECTED Final   Klebsiella oxytoca NOT DETECTED NOT DETECTED Final   Klebsiella pneumoniae NOT DETECTED NOT DETECTED Final   Proteus species NOT DETECTED NOT DETECTED Final   Salmonella species NOT DETECTED NOT  DETECTED Final   Serratia marcescens NOT DETECTED NOT DETECTED Final   Haemophilus influenzae NOT DETECTED NOT DETECTED Final   Neisseria meningitidis NOT DETECTED NOT DETECTED Final   Pseudomonas aeruginosa NOT DETECTED NOT DETECTED Final   Stenotrophomonas maltophilia NOT DETECTED NOT DETECTED Final   Candida albicans NOT DETECTED NOT DETECTED Final   Candida auris NOT DETECTED NOT DETECTED Final   Candida glabrata NOT DETECTED NOT DETECTED Final   Candida krusei NOT DETECTED NOT DETECTED Final   Candida parapsilosis NOT DETECTED NOT DETECTED Final   Candida tropicalis NOT DETECTED NOT DETECTED Final   Cryptococcus neoformans/gattii NOT DETECTED NOT DETECTED Final    Comment: Performed at [facility], [address] [place], [place] [address]  Blood Culture (routine x 2)     Status: None (Preliminary result)   Collection Time: 07/20/23 11:43 PM   Specimen: BLOOD  Result Value Ref Range Status   Specimen Description BLOOD BLOOD LEFT ARM  Final   Special Requests   Final    BOTTLES DRAWN AEROBIC AND ANAEROBIC Blood Culture adequate volume   Culture   Final    NO GROWTH 3 DAYS Performed at [facility], [address] [place], [place] [address]    Report Status PENDING  Incomplete  Resp panel by RT-PCR (RSV, Flu A&B, Covid) Anterior Nasal Swab     Status: None   Collection Time: 07/20/23 11:43 PM   Specimen: Anterior Nasal Swab  Result Value Ref Range Status   SARS Coronavirus 2 by RT PCR NEGATIVE NEGATIVE Final   Influenza A by PCR NEGATIVE NEGATIVE Final   Influenza B by PCR NEGATIVE NEGATIVE  Final    Comment: (NOTE) The Xpert Xpress SARS-CoV-2/FLU/RSV plus assay is intended as an aid in the diagnosis of influenza from Nasopharyngeal swab specimens and should not be used as a sole basis for treatment. Nasal washings and aspirates are unacceptable for Xpert Xpress SARS-CoV-2/FLU/RSV testing.  Fact Sheet for Patients: BloggerCourse.com  Fact Sheet for Healthcare Providers: SeriousBroker.it  This test is not yet approved or cleared by the [place] FDA and has been authorized for detection and/or diagnosis of SARS-CoV-2 by FDA under an Emergency Use Authorization (EUA). This EUA will remain in effect (meaning this test can be used) for the duration of the COVID-19 declaration under Section 564(b)(1) of the Act, 21 U.S.C. section 360bbb-3(b)(1), unless the authorization is terminated or revoked.     Resp Syncytial Virus by PCR NEGATIVE NEGATIVE Final    Comment: (NOTE) Fact Sheet for Patients: BloggerCourse.com  Fact Sheet for Healthcare Providers: SeriousBroker.it  This test is not yet approved or cleared by the [place] FDA and has been authorized for detection and/or diagnosis of SARS-CoV-2 by FDA under an Emergency Use Authorization (EUA). This EUA will remain in effect (meaning this test can be used) for the duration of the COVID-19 declaration under Section 564(b)(1) of the Act, 21 U.S.C. section 360bbb-3(b)(1), unless the authorization is terminated or revoked.  Performed at [facility], [address], [place], [place] [address]          Radiology Studies: No results found.       Scheduled Meds:  carvedilol  6.25 mg Per Tube BID WC   free water  120 mL Per Tube Q4H   insulin aspart  0-9 Units Subcutaneous Q4H   insulin aspart  2 Units Subcutaneous Q4H   levETIRAcetam  500 mg Per Tube BID   rivaroxaban  10  mg Oral Daily    thiamine  100 mg Per Tube Daily   Continuous Infusions:  cefTRIAXone (ROCEPHIN)  IV 1 g (07/25/23 0054)   dextrose 5 % with KCl 20 mEq / L 20 mEq (07/25/23 1610)   feeding supplement (GLUCERNA 1.5 CAL) 1,000 mL (07/24/23 1632)          Glade Lloyd, MD [facility] 07/25/2023, 8:10 AM

## 2023-07-25 NOTE — Plan of Care (Signed)

## 2023-07-25 NOTE — Progress Notes (Signed)
Daily Progress Note   Patient Name: Gina Kelley       Date: 07/25/2023 DOB: Mar 12, 1936  Age: 88 y.o. MRN#: [id number] Attending Physician: Glade Lloyd, MD Primary Care Physician: System, Provider Not In Admit Date: 07/20/2023 Length of Stay: 4 days  Reason for Consultation/Follow-up: Establishing goals of care  HPI/Patient Profile:  88 y.o. female  with past medical history of PUD, iron deficiency anemia, subdural hematoma with residual left hemiparesis and dysarthria, severe dysphagia with PEG dependency, chronic hypoxic respiratory failure on 4 L [place], and GI bleed presented from her [name] due to abnormal labs along with increased confusion and hypotension. She was admitted on 07/20/2023 with severe sepsis and acute metabolic encephalopathy secondary to he acquired pneumonia and UTI, dehydration with associated electrolyte derangements, chronic respiratory failure with hypoxia on baseline 4 L of oxygen, and others.    As per daughter, at baseline, patient is not conversational but her eyes are usually open, she is able to track and occasionally moves her right leg.    PMT was consulted for [name] conversations.  Of note, this patient has been seen multiple times by our service in the past.  Subjective:   Subjective: Chart Reviewed. Updates received. Patient Assessed. Created space and opportunity for patient  and family to explore thoughts and feelings regarding current medical situation.  Today's Discussion: Today I saw the patient at the bedside.  Also present was the patient's husband, daughter, son-in-law.  We talked about how the patient is doing.  They note that she is more sleepy today, but they are happy that she is getting some rest.  We reviewed our conversations from yesterday and agree that they are very productive and beneficial.  They feel like they have a firm understanding of the plan moving forward, which is guided by what they feel her goals would be.  The plan is to continue  current care, hope for improvement.  Discharged to long-term care with outpatient palliative care in place (provided by the facility).  We spent time answering questions and discussing additional particulars.  The patient was sleeping comfortably and elected not to wake her to allow her to rest.  However, she did open her eyes when her daughter touched her hand but then promptly went back to sleep.  I told the family that because our goals are clear and our plan is in place that palliative medicine would back off.  If she remains in the hospital middle of next week I will check in.  However, if we are needed sooner than medical team and nursing team can reach [place] for the patient's family is more than welcome to call us and request [place] to come back.  I ensured they had our contact information.  I provided emotional and general support through therapeutic listening, empathy, sharing of stories, and other techniques. I answered all questions and addressed all concerns to the best of my ability.  Review of Systems  Unable to perform ROS   Objective:   Vital Signs:  BP (!) 143/69 (BP Location: Left Arm)   Pulse 83   Temp 98.1 F (36.7 C) (Axillary)   Resp 16   Ht 5\' 4"  (1.626 m)   Wt 48.8 kg   SpO2 92%   BMI 18.47 kg/m   Physical Exam Vitals and nursing note reviewed.  Constitutional:      General: She is sleeping. She is not in acute distress.    Appearance: She is ill-appearing.  HENT:  Head: Normocephalic and atraumatic.  Cardiovascular:     Rate and Rhythm: Normal rate.  Pulmonary:     Effort: Pulmonary effort is normal. No respiratory distress.     Breath sounds: No wheezing or rhonchi.  Abdominal:     General: Abdomen is flat. Bowel sounds are normal.     Palpations: Abdomen is soft.  Skin:    General: Skin is warm and dry.  Neurological:     Mental Status: She is easily aroused.     Palliative Assessment/Data: 10% (PEG tube w/ tube feeds)    Existing Vynca/ACP  Documentation: Goals of care signed 07/25/2023  Assessment & Plan:   Impression: Present on Admission:  Severe sepsis [facility])  88 year old female with acute presentation of chronic comorbidities as detailed above. The patient is readmitted with another episode of aspiration pneumonia and sepsis. Daughters are quite involved in her care at the long-term care facility. They have a very good understanding of her trajectory and the cyclical nature of her acute illness. They note some days she will not bounce back and at that time they will be ready to discuss hospice/comfort care. However, as long she continues to improve from these admissions they would like to continue current scope of care. They are realistic about effectiveness of cardiac resuscitation and confirmed desire for DNR status. Overall long-term prognosis is poor.   SUMMARY OF RECOMMENDATIONS   DNR-limited Time for outcomes/clinical improvement Anticipate discharge back to long-term care when acute illness is resolved Appreciate continued palliative care at LTC facility Palliative medicine will back off for now but plan to follow-up 07/29/2023 Please notify us of significant clinical change or new palliative needs in the interim  Symptom Management:  Per primary team PMD is available to assist as needed  Code Status: DNR-limited  Prognosis: Unable to determine  Discharge Planning: Skilled Nursing Facility for rehab with Palliative care service follow-up  Discussed with: Patient's family, medical team, nursing team  Thank you for allowing [place] to participate in the care of Gina Kelley PMT will continue to support holistically.  Time Total: 30 min  Detailed review of medical records (labs, imaging, vital signs), medically appropriate exam, discussed with treatment team, counseling and education to patient, family, & staff, documenting clinical information, medication management, coordination of care  Wynne Dust,  NP Palliative Medicine Team  Team Phone # [phone] (Nights/Weekends)  02/26/2021, 8:17 AM

## 2023-07-26 DIAGNOSIS — A419 Sepsis, unspecified organism: Secondary | ICD-10-CM | POA: Diagnosis not present

## 2023-07-26 DIAGNOSIS — R6520 Severe sepsis without septic shock: Secondary | ICD-10-CM | POA: Diagnosis not present

## 2023-07-26 LAB — GLUCOSE, CAPILLARY
Glucose-Capillary: 137 mg/dL — ABNORMAL HIGH (ref 70–99)
Glucose-Capillary: 142 mg/dL — ABNORMAL HIGH (ref 70–99)
Glucose-Capillary: 150 mg/dL — ABNORMAL HIGH (ref 70–99)
Glucose-Capillary: 151 mg/dL — ABNORMAL HIGH (ref 70–99)
Glucose-Capillary: 156 mg/dL — ABNORMAL HIGH (ref 70–99)
Glucose-Capillary: 168 mg/dL — ABNORMAL HIGH (ref 70–99)

## 2023-07-26 LAB — CBC WITH DIFFERENTIAL/PLATELET
Abs Immature Granulocytes: 0.05 10*3/uL (ref 0.00–0.07)
Basophils Absolute: 0 10*3/uL (ref 0.0–0.1)
Basophils Relative: 0 %
Eosinophils Absolute: 0.2 10*3/uL (ref 0.0–0.5)
Eosinophils Relative: 2 %
HCT: 26 % — ABNORMAL LOW (ref 36.0–46.0)
Hemoglobin: 7.8 g/dL — ABNORMAL LOW (ref 12.0–15.0)
Immature Granulocytes: 1 %
Lymphocytes Relative: 12 %
Lymphs Abs: 1 10*3/uL (ref 0.7–4.0)
MCH: 28.8 pg (ref 26.0–34.0)
MCHC: 30 g/dL (ref 30.0–36.0)
MCV: 95.9 fL (ref 80.0–100.0)
Monocytes Absolute: 1 10*3/uL (ref 0.1–1.0)
Monocytes Relative: 12 %
Neutro Abs: 6.6 10*3/uL (ref 1.7–7.7)
Neutrophils Relative %: 73 %
Platelets: 185 10*3/uL (ref 150–400)
RBC: 2.71 MIL/uL — ABNORMAL LOW (ref 3.87–5.11)
RDW: 17 % — ABNORMAL HIGH (ref 11.5–15.5)
WBC: 9 10*3/uL (ref 4.0–10.5)
nRBC: 0 % (ref 0.0–0.2)

## 2023-07-26 LAB — BASIC METABOLIC PANEL
Anion gap: 8 (ref 5–15)
BUN: 26 mg/dL — ABNORMAL HIGH (ref 8–23)
CO2: 32 mmol/L (ref 22–32)
Calcium: 9 mg/dL (ref 8.9–10.3)
Chloride: 103 mmol/L (ref 98–111)
Creatinine, Ser: 0.57 mg/dL (ref 0.44–1.00)
GFR, Estimated: >60 mL/min (ref >60)
Glucose, Bld: 146 mg/dL — ABNORMAL HIGH (ref 70–99)
Potassium: 3.8 mmol/L (ref 3.5–5.1)
Sodium: 143 mmol/L (ref 135–145)

## 2023-07-26 LAB — CULTURE, BLOOD (ROUTINE X 2)
Culture: NO GROWTH
Special Requests: ADEQUATE

## 2023-07-26 LAB — MAGNESIUM: Magnesium: 2.2 mg/dL (ref 1.7–2.4)

## 2023-07-26 NOTE — TOC Progression Note (Signed)
Transition of Care [facility]) - Progression Note    Patient Details  Name: Gina Kelley MRN: [id number] Date of Birth: [birth year]  Transition of Care [facility]) CM/SW Contact  Dellie Burns [place], [place] Phone Number: 07/26/2023, 10:34 AM  Clinical Narrative:   Per MD, pt potentially ready for dc tomorrow. Notified Nikki at [name] who confirmed pt able to return when ready.   Dellie Burns, MSW, LCSW [phone] (coverage)      Expected Discharge Plan: Skilled Nursing Facility Barriers to Discharge: Continued Medical Work up  Expected Discharge Plan and Services     Post Acute Care Choice: Skilled Nursing Facility Living arrangements for the past 2 months: Skilled Nursing Facility                                       Social Determinants of Health (SDOH) Interventions SDOH Screenings   Food Insecurity: Patient Unable To Answer (07/21/2023)  Housing: Patient Unable To Answer (07/21/2023)  Transportation Needs: Patient Unable To Answer (07/21/2023)  Utilities: Patient Unable To Answer (07/21/2023)  Depression (PHQ2-9): Low Risk  (11/15/2021)  Social Connections: Patient Unable To Answer (07/21/2023)  Tobacco Use: Medium Risk (07/21/2023)    Readmission Risk Interventions     No data to display

## 2023-07-26 NOTE — Plan of Care (Signed)

## 2023-07-26 NOTE — Progress Notes (Signed)
PROGRESS NOTE    DAZIA LIPPOLD  UJW:[id number] DOB: Nov 20, 1935 DOA: 07/20/2023 PCP: System, Provider Not In   Brief Narrative:  88 y.o. female with medical history significant for PUD, iron deficiency anemia, subdural hematoma with residual left hemiparesis and dysarthria, severe dysphagia with PEG dependency, chronic hypoxic respiratory failure on 4 L [place], and GI bleed presented from her [name] due to abnormal labs along with increased confusion and hypotension.  As per daughter, at baseline, patient is not conversational but her eyes are usually open, she is able to track and occasionally moves her right leg.  On presentation, sodium was 157, creatinine of 1.41, WBC of 18.8.  Chest x-ray showed right basilar consolidation suspicious for acute lobar pneumonia.  She was started on IV fluids and antibiotics.  Palliative care consulted for goals of care discussion.  Assessment & Plan:   Severe sepsis: Present on admission Acute metabolic encephalopathy Community-acquired pneumonia UTI: Present on admission Lactic acidosis: Improving Leukocytosis -Presented with altered mental status with evidence of pneumonia on chest x-ray and UTI along with leukocytosis/tachycardia/fever/tachypnea/lactic acidosis/AKI -Continue Rocephin and Zithromax and complete 7-day course.  COVID/RSV/influenza PCR negative on presentation. -Blood cultures grew Staph hominis: Most likely contaminant.  Urine cultures pending -Not spiking temperatures currently. -WBCs have normalized   hypernatremia -Possibly from dehydration -Sodium 157 at [name].  Treated with IV fluids. Sodium 143 this morning.  DC IV fluids.  Continue free water via tube.  Monitor  Chronic respiratory failure with hypoxia -Normally on 4 L oxygen via nasal cannula.  Currently stable.  Continue oxygen supplementation  Elevated LFTs -Patient has chronically elevated LFTs.  Questionable cause.  Monitor intermittently  Hypokalemia -Improved  Anemia of  chronic disease/iron deficiency anemia -Hemoglobin currently stable.  Monitor intermittently.  Diabetes mellitus type 2 with hyperglycemia -Continue CBGs with SSI.  Continue NovoLog with tube feeds.  Diabetes coordinator following  History of intracranial hemorrhage/subdural hematoma s/p craniotomy with dysphagia/dysarthria and left-sided hemiparesis -Has been PEG tube dependent for a year now.  Tube feeds have been resumed as per dietitian recommendations.  Hypertension -Blood pressure intermittently elevated.  Continue Coreg.  Amlodipine on hold  Hyperlipidemia GERD Depression Seizure disorder -Continue Keppra.  Other medications will be resumed on discharge  Goals of care -Palliative care consultation following: Family want to continue current care with hopeful improvement with plans for palliative care follow-up at long-term care.  Prognosis very poor.    DVT prophylaxis: Xarelto Code Status: DNR Family Communication: None at bedside Disposition Plan: Status is: Inpatient Remains inpatient appropriate because: Of severity of illness    Consultants: Palliative care  Procedures: None  Antimicrobials:  Anti-infectives (From admission, onward)    Start     Dose/Rate Route Frequency Ordered Stop   07/22/23 0200  azithromycin (ZITHROMAX) 500 mg in sodium chloride 0.9 % 250 mL IVPB        500 mg 250 mL/hr over 60 Minutes Intravenous Every 24 hours 07/21/23 0334 07/25/23 0240   07/22/23 0000  cefTRIAXone (ROCEPHIN) 1 g in sodium chloride 0.9 % 100 mL IVPB        1 g 200 mL/hr over 30 Minutes Intravenous Every 24 hours 07/21/23 0334     07/21/23 0045  cefTRIAXone (ROCEPHIN) 1 g in sodium chloride 0.9 % 100 mL IVPB        1 g 200 mL/hr over 30 Minutes Intravenous  Once 07/21/23 0038 07/21/23 0119   07/21/23 0045  azithromycin (ZITHROMAX) 500 mg in sodium chloride 0.9 % 250  mL IVPB        500 mg 250 mL/hr over 60 Minutes Intravenous  Once 07/21/23 0038 07/21/23 0150         Subjective: Patient seen and examined at bedside.  No agitation, seizures or fever reported.   Objective: Vitals:   07/25/23 0746 07/25/23 1959 07/26/23 0424 07/26/23 0734  BP: (!) 143/69 132/74 135/70 (!) 141/75  Pulse: 83 85 81 87  Resp: 16 18 18 18   Temp: 98.1 F (36.7 C) 98.7 F (37.1 C) 98.3 F (36.8 C) 97.8 F (36.6 C)  TempSrc: Axillary Oral  Axillary  SpO2: 92% 100% 100% 100%  Weight:      Height:        Intake/Output Summary (Last 24 hours) at 07/26/2023 0757 Last data filed at 07/25/2023 1800 Gross per 24 hour  Intake 0 ml  Output 100 ml  Net -100 ml   Filed Weights   07/20/23 2325 07/21/23 1300  Weight: 54 kg 48.8 kg    Examination:  General: Chronically ill and deconditioned looking.  No distress currently.  On 2 L oxygen by nasal cannula  ENT/neck: No obvious thyromegaly or elevated JVD noted  respiratory: Decreased breath sounds at bases bilaterally with some crackles CVS: Mostly rate controlled; S1 and S2 are heard abdominal: Soft, nontender, still slightly distended; no organomegaly, bowel sounds are heard.  Peg tube present Extremities: No clubbing; mild lower extremity edema present  CNS: Hardly follows any commands and remains confused  lymph: No lymphadenopathy palpable  skin: No obvious petechia/rashes psych: Not agitated currently.  Flat affect.   Musculoskeletal: Contracted extremities present     Data Reviewed: I have personally reviewed following labs and imaging studies  CBC: Recent Labs  Lab 07/20/23 2341 07/21/23 0407 07/23/23 0327 07/24/23 0608 07/26/23 0619  WBC 23.2* 20.1* 10.6* 11.0* 9.0  NEUTROABS 19.9*  --  8.6* 9.5* 6.6  HGB 10.0* 8.1* 7.9* 9.5* 7.8*  HCT 31.8* 28.2* 26.4* 31.6* 26.0*  MCV 98.1 102.5* 97.4 95.8 95.9  PLT 185 134* 137* 162 185   Basic Metabolic Panel: Recent Labs  Lab 07/21/23 0407 07/21/23 0622 07/22/23 1104 07/23/23 0327 07/24/23 0608 07/25/23 0620 07/26/23 0619  NA 156*   < > 143  148* 149* 145 143  K 3.8  --   --  3.3* 3.3* 3.6 3.8  CL 116*  --   --  111 109 102 103  CO2 27  --   --  31 30 33* 32  GLUCOSE 211*  --   --  171* 276* 145* 146*  BUN 112*  --   --  57* 30* 24* 26*  CREATININE 1.70*  --   --  0.83 0.71 0.65 0.57  CALCIUM 8.3*  --   --  8.4* 9.0 8.8* 9.0  MG  --   --   --  2.9* 2.2 2.2 2.2  PHOS  --   --   --  2.6  --   --   --    < > = values in this interval not displayed.   GFR: Estimated Creatinine Clearance: 39.6 mL/min (by C-G formula based on SCr of 0.57 mg/dL). Liver Function Tests: Recent Labs  Lab 07/20/23 2341 07/23/23 0327  AST 100* 177*  ALT 86* 118*  ALKPHOS 79 78  BILITOT 0.8 0.3  PROT 6.9 6.2*  ALBUMIN 1.9* <1.5*   No results for input(s): "LIPASE", "AMYLASE" in the last 168 hours. No results for input(s): "AMMONIA" in the last  168 hours. Coagulation Profile: No results for input(s): "INR", "PROTIME" in the last 168 hours. Cardiac Enzymes: No results for input(s): "CKTOTAL", "CKMB", "CKMBINDEX", "TROPONINI" in the last 168 hours. BNP (last 3 results) No results for input(s): "PROBNP" in the last 8760 hours. HbA1C: No results for input(s): "HGBA1C" in the last 72 hours. CBG: Recent Labs  Lab 07/25/23 1609 07/25/23 1957 07/25/23 2359 07/26/23 0424 07/26/23 0745  GLUCAP 96 108* 151* 156* 142*   Lipid Profile: No results for input(s): "CHOL", "HDL", "LDLCALC", "TRIG", "CHOLHDL", "LDLDIRECT" in the last 72 hours. Thyroid Function Tests: No results for input(s): "TSH", "T4TOTAL", "FREET4", "T3FREE", "THYROIDAB" in the last 72 hours.  Anemia Panel: No results for input(s): "VITAMINB12", "FOLATE", "FERRITIN", "TIBC", "IRON", "RETICCTPCT" in the last 72 hours. Sepsis Labs: Recent Labs  Lab 07/21/23 0027 07/21/23 0225 07/21/23 0407 07/21/23 0800  PROCALCITON  --   --  0.82  --   LATICACIDVEN 4.9* 4.0* 3.1* 2.3*    Recent Results (from the past 240 hours)  Blood Culture (routine x 2)     Status: Abnormal    Collection Time: 07/20/23 11:39 PM   Specimen: BLOOD RIGHT HAND  Result Value Ref Range Status   Specimen Description BLOOD RIGHT HAND  Final   Special Requests   Final    BOTTLES DRAWN AEROBIC AND ANAEROBIC Blood Culture adequate volume   Culture  Setup Time   Final    GRAM POSITIVE COCCI IN CLUSTERS IN BOTH AEROBIC AND ANAEROBIC BOTTLES CRITICAL RESULT CALLED TO, READ BACK BY AND VERIFIED WITH: PHARMD E. PAYTES [id number] @ 1935 FH    Culture (A)  Final    STAPHYLOCOCCUS HOMINIS THE SIGNIFICANCE OF ISOLATING THIS ORGANISM FROM A SINGLE SET OF BLOOD CULTURES WHEN MULTIPLE SETS ARE DRAWN IS UNCERTAIN. PLEASE NOTIFY THE MICROBIOLOGY DEPARTMENT WITHIN ONE WEEK IF SPECIATION AND SENSITIVITIES ARE REQUIRED. Performed at [facility], [address] [place], [place] [address]    Report Status 07/23/2023 FINAL  Final  Blood Culture ID Panel (Reflexed)     Status: Abnormal   Collection Time: 07/20/23 11:39 PM  Result Value Ref Range Status   Enterococcus faecalis NOT DETECTED NOT DETECTED Final   Enterococcus Faecium NOT DETECTED NOT DETECTED Final   Listeria monocytogenes NOT DETECTED NOT DETECTED Final   Staphylococcus species DETECTED (A) NOT DETECTED Final    Comment: CRITICAL RESULT CALLED TO, READ BACK BY AND VERIFIED WITH: PHARMD E. PAYTES [id number] @ 1935 FH    Staphylococcus aureus (BCID) NOT DETECTED NOT DETECTED Final   Staphylococcus epidermidis NOT DETECTED NOT DETECTED Final   Staphylococcus lugdunensis NOT DETECTED NOT DETECTED Final   Streptococcus species NOT DETECTED NOT DETECTED Final   Streptococcus agalactiae NOT DETECTED NOT DETECTED Final   Streptococcus pneumoniae NOT DETECTED NOT DETECTED Final   Streptococcus pyogenes NOT DETECTED NOT DETECTED Final   A.calcoaceticus-baumannii NOT DETECTED NOT DETECTED Final   Bacteroides fragilis NOT DETECTED NOT DETECTED Final   Enterobacterales NOT DETECTED NOT DETECTED Final   Enterobacter cloacae complex NOT DETECTED NOT  DETECTED Final   Escherichia coli NOT DETECTED NOT DETECTED Final   Klebsiella aerogenes NOT DETECTED NOT DETECTED Final   Klebsiella oxytoca NOT DETECTED NOT DETECTED Final   Klebsiella pneumoniae NOT DETECTED NOT DETECTED Final   Proteus species NOT DETECTED NOT DETECTED Final   Salmonella species NOT DETECTED NOT DETECTED Final   Serratia marcescens NOT DETECTED NOT DETECTED Final   Haemophilus influenzae NOT DETECTED NOT DETECTED Final   Neisseria meningitidis  NOT DETECTED NOT DETECTED Final   Pseudomonas aeruginosa NOT DETECTED NOT DETECTED Final   Stenotrophomonas maltophilia NOT DETECTED NOT DETECTED Final   Candida albicans NOT DETECTED NOT DETECTED Final   Candida auris NOT DETECTED NOT DETECTED Final   Candida glabrata NOT DETECTED NOT DETECTED Final   Candida krusei NOT DETECTED NOT DETECTED Final   Candida parapsilosis NOT DETECTED NOT DETECTED Final   Candida tropicalis NOT DETECTED NOT DETECTED Final   Cryptococcus neoformans/gattii NOT DETECTED NOT DETECTED Final    Comment: Performed at [facility], [address], [place], [place] [address]  Blood Culture (routine x 2)     Status: None (Preliminary result)   Collection Time: 07/20/23 11:43 PM   Specimen: BLOOD  Result Value Ref Range Status   Specimen Description BLOOD BLOOD LEFT ARM  Final   Special Requests   Final    BOTTLES DRAWN AEROBIC AND ANAEROBIC Blood Culture adequate volume   Culture   Final    NO GROWTH 4 DAYS Performed at [name], [address] [place], [place] [address]    Report Status PENDING  Incomplete  Resp panel by RT-PCR (RSV, Flu A&B, Covid) Anterior Nasal Swab     Status: None   Collection Time: 07/20/23 11:43 PM   Specimen: Anterior Nasal Swab  Result Value Ref Range Status   SARS Coronavirus 2 by RT PCR NEGATIVE NEGATIVE Final   Influenza A by PCR NEGATIVE NEGATIVE Final   Influenza B by PCR NEGATIVE NEGATIVE Final    Comment: (NOTE) The Xpert Xpress  SARS-CoV-2/FLU/RSV plus assay is intended as an aid in the diagnosis of influenza from Nasopharyngeal swab specimens and should not be used as a sole basis for treatment. Nasal washings and aspirates are unacceptable for Xpert Xpress SARS-CoV-2/FLU/RSV testing.  Fact Sheet for Patients: BloggerCourse.com  Fact Sheet for Healthcare Providers: SeriousBroker.it  This test is not yet approved or cleared by the [place] FDA and has been authorized for detection and/or diagnosis of SARS-CoV-2 by FDA under an Emergency Use Authorization (EUA). This EUA will remain in effect (meaning this test can be used) for the duration of the COVID-19 declaration under Section 564(b)(1) of the Act, 21 U.S.C. section 360bbb-3(b)(1), unless the authorization is terminated or revoked.     Resp Syncytial Virus by PCR NEGATIVE NEGATIVE Final    Comment: (NOTE) Fact Sheet for Patients: BloggerCourse.com  Fact Sheet for Healthcare Providers: SeriousBroker.it  This test is not yet approved or cleared by the [place] FDA and has been authorized for detection and/or diagnosis of SARS-CoV-2 by FDA under an Emergency Use Authorization (EUA). This EUA will remain in effect (meaning this test can be used) for the duration of the COVID-19 declaration under Section 564(b)(1) of the Act, 21 U.S.C. section 360bbb-3(b)(1), unless the authorization is terminated or revoked.  Performed at [facility], [address], [place], [place] [address]          Radiology Studies: No results found.       Scheduled Meds:  carvedilol  6.25 mg Per Tube BID WC   free water  120 mL Per Tube Q4H   insulin aspart  0-9 Units Subcutaneous Q4H   insulin aspart  2 Units Subcutaneous Q4H   levETIRAcetam  500 mg Per Tube BID   rivaroxaban  10 mg Oral Daily   thiamine  100 mg Per Tube Daily   Continuous  Infusions:  cefTRIAXone (ROCEPHIN)  IV 1 g (  07/26/23 0015)   feeding supplement (GLUCERNA 1.5 CAL) 45 mL/hr at 07/25/23 1628          Glade Lloyd, MD [facility] 07/26/2023, 7:57 AM

## 2023-07-27 DIAGNOSIS — R6520 Severe sepsis without septic shock: Secondary | ICD-10-CM | POA: Diagnosis not present

## 2023-07-27 DIAGNOSIS — A419 Sepsis, unspecified organism: Secondary | ICD-10-CM | POA: Diagnosis not present

## 2023-07-27 LAB — CBC WITH DIFFERENTIAL/PLATELET
Abs Immature Granulocytes: 0.06 10*3/uL (ref 0.00–0.07)
Basophils Absolute: 0 10*3/uL (ref 0.0–0.1)
Basophils Relative: 0 %
Eosinophils Absolute: 0.2 10*3/uL (ref 0.0–0.5)
Eosinophils Relative: 2 %
HCT: 27.6 % — ABNORMAL LOW (ref 36.0–46.0)
Hemoglobin: 8.4 g/dL — ABNORMAL LOW (ref 12.0–15.0)
Immature Granulocytes: 1 %
Lymphocytes Relative: 11 %
Lymphs Abs: 1 10*3/uL (ref 0.7–4.0)
MCH: 28.9 pg (ref 26.0–34.0)
MCHC: 30.4 g/dL (ref 30.0–36.0)
MCV: 94.8 fL (ref 80.0–100.0)
Monocytes Absolute: 0.9 10*3/uL (ref 0.1–1.0)
Monocytes Relative: 11 %
Neutro Abs: 6.5 10*3/uL (ref 1.7–7.7)
Neutrophils Relative %: 75 %
Platelets: 186 10*3/uL (ref 150–400)
RBC: 2.91 MIL/uL — ABNORMAL LOW (ref 3.87–5.11)
RDW: 16.6 % — ABNORMAL HIGH (ref 11.5–15.5)
WBC: 8.6 10*3/uL (ref 4.0–10.5)
nRBC: 0 % (ref 0.0–0.2)

## 2023-07-27 LAB — MAGNESIUM: Magnesium: 2.1 mg/dL (ref 1.7–2.4)

## 2023-07-27 LAB — BASIC METABOLIC PANEL
Anion gap: 9 (ref 5–15)
BUN: 22 mg/dL (ref 8–23)
CO2: 31 mmol/L (ref 22–32)
Calcium: 8.6 mg/dL — ABNORMAL LOW (ref 8.9–10.3)
Chloride: 98 mmol/L (ref 98–111)
Creatinine, Ser: 0.51 mg/dL (ref 0.44–1.00)
GFR, Estimated: >60 mL/min (ref >60)
Glucose, Bld: 173 mg/dL — ABNORMAL HIGH (ref 70–99)
Potassium: 3.8 mmol/L (ref 3.5–5.1)
Sodium: 138 mmol/L (ref 135–145)

## 2023-07-27 LAB — GLUCOSE, CAPILLARY
Glucose-Capillary: 181 mg/dL — ABNORMAL HIGH (ref 70–99)
Glucose-Capillary: 182 mg/dL — ABNORMAL HIGH (ref 70–99)
Glucose-Capillary: 190 mg/dL — ABNORMAL HIGH (ref 70–99)
Glucose-Capillary: 197 mg/dL — ABNORMAL HIGH (ref 70–99)

## 2023-07-27 MED ORDER — GLUCERNA 1.5 CAL PO LIQD: 45.0000 mL/h | Freq: Three times a day (TID) | ORAL | Status: AC

## 2023-07-27 NOTE — TOC Transition Note (Signed)
Transition of Care [facility]) - Discharge Note   Patient Details  Name: Gina Kelley MRN: [id number] Date of Birth: 1935/08/07  Transition of Care [facility]) CM/SW Contact:  Carley Hammed, LCSW Phone Number: 07/27/2023, 10:34 AM   Clinical Narrative:    Pt to be transported to [name] via [facility]. Nurse to call report to [phone]. Rm# [phone].  CSW spoke with Dtr Lesly Dukes and asked if she had documentation of being pt's Legal Guardian as it was flagged on the chart. CSW attempted to educate pt on the differences between LG, POA and NOK. Dtr doesn't seem to understand the question and states she will have [name] send over documentation. [facility] notes they have no documentation of pt having a legal guardian. CSW to remove flag at this time.    Final next level of care: Skilled Nursing Facility Barriers to Discharge: Barriers Resolved   Patient Goals and CMS Choice            Discharge Placement              Patient chooses bed at: [facility] Patient to be transferred to facility by: [facility] Name of family member notified: Lesly Dukes Patient and family notified of of transfer: 07/27/23  Discharge Plan and Services Additional resources added to the After Visit Summary for       Post Acute Care Choice: Skilled Nursing Facility                               Social Drivers of Health (SDOH) Interventions SDOH Screenings   Food Insecurity: Patient Unable To Answer (07/21/2023)  Housing: Patient Unable To Answer (07/21/2023)  Transportation Needs: Patient Unable To Answer (07/21/2023)  Utilities: Patient Unable To Answer (07/21/2023)  Depression (PHQ2-9): Low Risk  (11/15/2021)  Social Connections: Patient Unable To Answer (07/21/2023)  Tobacco Use: Medium Risk (07/21/2023)     Readmission Risk Interventions     No data to display

## 2023-07-27 NOTE — Discharge Summary (Signed)
Physician Discharge Summary  Gina Kelley:[id number] DOB: 06/29/1936 DOA: 07/20/2023  PCP: System, Provider Not In  Admit date: 07/20/2023 Discharge date: 07/27/2023  Admitted From: [name] Disposition: [name]  Recommendations for Outpatient Follow-up:  Follow up with [name] provider at earliest convenience Outpatient follow-up with palliative care to continue goals of care discussion Follow up in ED if symptoms worsen or new appear   Home Health: No Equipment/Devices: None  Discharge Condition: Guarded to poor  CODE STATUS: DNR Diet recommendation: Tube feeding diet as per dietary recommendations  Brief/Interim Summary: 88 y.o. female with medical history significant for PUD, iron deficiency anemia, subdural hematoma with residual left hemiparesis and dysarthria, severe dysphagia with PEG dependency, chronic hypoxic respiratory failure on 4 L [place], and GI bleed presented from her [name] due to abnormal labs along with increased confusion and hypotension.  As per daughter, at baseline, patient is not conversational but her eyes are usually open, she is able to track and occasionally moves her right leg.  On presentation, sodium was 157, creatinine of 1.41, WBC of 18.8.  Chest x-ray showed right basilar consolidation suspicious for acute lobar pneumonia.  She was started on IV fluids and antibiotics.  Palliative care consulted for goals of care discussion.  During the hospitalization, she has completed 7-day course of antibiotic treatment.  Currently not spiking temperatures.  Overall prognosis is poor but she is currently stable to discharge back to [name].  She will be discharged back to [name] today.  Discharge Diagnoses:   Severe sepsis: Present on admission Acute metabolic encephalopathy Community-acquired pneumonia UTI: Present on admission Lactic acidosis: Improving Leukocytosis -Presented with altered mental status with evidence of pneumonia on chest x-ray and UTI along with  leukocytosis/tachycardia/fever/tachypnea/lactic acidosis/AKI -Completed 7-day course of IV antibiotics.  No need for any further antibiotics.  COVID/RSV/influenza PCR negative on presentation. -Blood cultures grew Staph hominis: Most likely contaminant.   -Not spiking temperatures currently. -WBCs have normalized  -Discharge patient back to [name] today.   hypernatremia -Possibly from dehydration -Sodium 157 at [name].  Treated with IV fluids. Sodium 138  this morning.  Off IV fluids.  Continue free water via tube.  Outpatient follow-up   Chronic respiratory failure with hypoxia -Normally on 4 L oxygen via nasal cannula.  Currently stable.  Continue oxygen supplementation   Elevated LFTs -Patient has chronically elevated LFTs.  Questionable cause.  Monitor intermittently as an outpatient.  Will keep statin on hold.   Hypokalemia -Improved   Anemia of chronic disease/iron deficiency anemia -Hemoglobin currently stable.  Monitor intermittently.   Diabetes mellitus type 2 with hyperglycemia -Outpatient follow-up.  History of intracranial hemorrhage/subdural hematoma s/p craniotomy with dysphagia/dysarthria and left-sided hemiparesis -Has been PEG tube dependent for a year now.  Tube feeds have been resumed as per dietitian recommendations.   Hypertension -Blood pressure intermittently elevated.  Continue Coreg.  Resume amlodipine.  Hyperlipidemia GERD Depression Seizure disorder -Continue Keppra.  Keep on hold.  Continue PPI.  Goals of care -Palliative care consultation following: Family want to continue current care with hopeful improvement with plans for palliative care follow-up at long-term care.  Prognosis very poor.  Outpatient follow-up with palliative care.  Discharge Instructions   Allergies as of 07/27/2023       Reactions   Cozaar [losartan] Swelling   Angioedema    Periogard [chlorhexidine Gluconate] Swelling, Other (See Comments)   Skin irritation Eye/tongue/lip  swelling   Pork Allergy Other (See Comments)   Unknown reaction   Vasotec [enalapril] Cough  Medication List     STOP taking these medications    atorvastatin 80 MG tablet Commonly known as: LIPITOR   gabapentin 250 MG/5ML solution Commonly known as: NEURONTIN       TAKE these medications    acetaminophen 500 MG tablet Commonly known as: TYLENOL Place 500 mg into feeding tube every 6 (six) hours as needed for mild pain (pain score 1-3). What changed: Another medication with the same name was removed. Continue taking this medication, and follow the directions you see here.   amLODipine 10 MG tablet Commonly known as: NORVASC Place 10 mg into feeding tube daily.   ascorbic acid 500 MG tablet Commonly known as: VITAMIN C Place 500 mg into feeding tube daily.   carvedilol 6.25 MG tablet Commonly known as: COREG Place 1 tablet (6.25 mg total) into feeding tube 2 (two) times daily with a meal.   ezetimibe 10 MG tablet Commonly known as: ZETIA Place 1 tablet (10 mg total) into feeding tube daily. What changed:  how much to take when to take this   famotidine 20 MG tablet Commonly known as: PEPCID Place 1 tablet (20 mg total) into feeding tube daily at 4 PM.   feeding supplement (GLUCERNA 1.5 CAL) Liqd Place 45 mL/hr into feeding tube 3 (three) times daily. Place 45ml into feeding tube continuouslt What changed:  how much to take when to take this additional instructions how fast to infuse this   FLUoxetine 20 MG capsule Commonly known as: PROZAC Place 1 capsule (20 mg total) into feeding tube at bedtime.   free water Soln Place 120 mLs into feeding tube every 4 (four) hours.   STERILE WATER FOR IRRIGATION IR Place 30 mLs into feeding tube in the morning, at noon, and at bedtime. Flush tube with 30ml of water before and after giving medications   guaiFENesin 100 MG/5ML liquid Commonly known as: ROBITUSSIN Place 100 mg into feeding tube 4 (four)  times daily.   insulin lispro 100 UNIT/ML KwikPen Commonly known as: HUMALOG Inject 0-16 Units into the skin See admin instructions. Inject 0-16 units 4 times daily per sliding scale: 70-150 : 0 units 151-200 : 3 units 201-250 : 6 units 251-300 : 9 units 301-350 : 12 units 351-400 : 16 units > 401 : notify MD   levETIRAcetam 100 MG/ML solution Commonly known as: Keppra Place 5 mLs (500 mg total) into feeding tube 2 (two) times daily.   lidocaine 5 % Commonly known as: LIDODERM Place 2 patches onto the skin daily. Apply to neck and right knee   Metanx FC 3-35-2 MG Caps Give 1 capsule by tube daily.   metFORMIN 500 MG tablet Commonly known as: GLUCOPHAGE Place 500 mg into feeding tube 2 (two) times daily.   metoCLOPramide 5 MG tablet Commonly known as: REGLAN Place 5 mg into feeding tube 3 (three) times daily.   One Vite Ferrous Sulfate 300 MG/6.8ML Soln Generic drug: Ferrous Sulfate Give 7.5 mLs by tube daily.   OXYGEN Inhale 4 L/min into the lungs continuous.   pantoprazole 2 mg/mL suspension Commonly known as: PROTONIX Place 40 mg into feeding tube daily.   PreserVision AREDS 2 Chew Give 1 each by tube 2 (two) times daily.   Pro-Stat Liqd Place 30 mLs into feeding tube 2 (two) times daily.        Allergies  Allergen Reactions   Cozaar [Losartan] Swelling    Angioedema    Periogard [Chlorhexidine Gluconate] Swelling and Other (See Comments)  Skin irritation Eye/tongue/lip swelling   Pork Allergy Other (See Comments)    Unknown reaction   Vasotec [Enalapril] Cough    Consultations: Palliative care   Procedures/Studies: DG Chest Port 1 View Result Date: 07/20/2023 CLINICAL DATA:  Sepsis EXAM: PORTABLE CHEST 1 VIEW COMPARISON:  03/10/2023 FINDINGS: New elevation of the right hemidiaphragm. There is superimposed right basilar consolidation with air bronchograms identified suspicious for changes of acute lobar pneumonia in the appropriate clinical  setting. Left lung is clear. No pneumothorax or pleural effusion. Cardiac size within normal limits. Pulmonary vascularity is normal. IMPRESSION: 1. New elevation of the right hemidiaphragm with superimposed right basilar consolidation suspicious for changes of acute lobar pneumonia in the appropriate clinical setting. Electronically Signed   By: Helyn Numbers M.D.   On: 07/20/2023 23:37      Subjective: Patient seen and examined at bedside.  No agitation, seizures, vomiting reported.  Discharge Exam: Vitals:   07/27/23 0417 07/27/23 0739  BP: (!) 158/81 (!) 146/77  Pulse: 78 82  Resp: 18 18  Temp: 97.8 F (36.6 C) 97.9 F (36.6 C)  SpO2: 100% 100%    General: Chronically ill and deconditioned looking.  No acute distress currently.  Hardly follows any commands and remains confused.   Cardiovascular: rate controlled, S1/S2 + Respiratory: bilateral decreased breath sounds at bases with scattered crackles Abdominal: Soft, NT, ND, bowel sounds +; PEG tube present Extremities: Trace lower extremity edema present; no cyanosis    The results of significant diagnostics from this hospitalization (including imaging, microbiology, ancillary and laboratory) are listed below for reference.     Microbiology: Recent Results (from the past 240 hours)  Blood Culture (routine x 2)     Status: Abnormal   Collection Time: 07/20/23 11:39 PM   Specimen: BLOOD RIGHT HAND  Result Value Ref Range Status   Specimen Description BLOOD RIGHT HAND  Final   Special Requests   Final    BOTTLES DRAWN AEROBIC AND ANAEROBIC Blood Culture adequate volume   Culture  Setup Time   Final    GRAM POSITIVE COCCI IN CLUSTERS IN BOTH AEROBIC AND ANAEROBIC BOTTLES CRITICAL RESULT CALLED TO, READ BACK BY AND VERIFIED WITH: PHARMD E. PAYTES [id number] @ 1937 FH    Culture (A)  Final    STAPHYLOCOCCUS HOMINIS THE SIGNIFICANCE OF ISOLATING THIS ORGANISM FROM A SINGLE SET OF BLOOD CULTURES WHEN MULTIPLE SETS ARE DRAWN IS  UNCERTAIN. PLEASE NOTIFY THE MICROBIOLOGY DEPARTMENT WITHIN ONE WEEK IF SPECIATION AND SENSITIVITIES ARE REQUIRED. Performed at [facility], [address], [place], [place] [address]    Report Status 07/23/2023 FINAL  Final  Blood Culture ID Panel (Reflexed)     Status: Abnormal   Collection Time: 07/20/23 11:39 PM  Result Value Ref Range Status   Enterococcus faecalis NOT DETECTED NOT DETECTED Final   Enterococcus Faecium NOT DETECTED NOT DETECTED Final   Listeria monocytogenes NOT DETECTED NOT DETECTED Final   Staphylococcus species DETECTED (A) NOT DETECTED Final    Comment: CRITICAL RESULT CALLED TO, READ BACK BY AND VERIFIED WITH: PHARMD E. PAYTES [id number] @ 1937 FH    Staphylococcus aureus (BCID) NOT DETECTED NOT DETECTED Final   Staphylococcus epidermidis NOT DETECTED NOT DETECTED Final   Staphylococcus lugdunensis NOT DETECTED NOT DETECTED Final   Streptococcus species NOT DETECTED NOT DETECTED Final   Streptococcus agalactiae NOT DETECTED NOT DETECTED Final   Streptococcus pneumoniae NOT DETECTED NOT DETECTED Final   Streptococcus pyogenes NOT DETECTED NOT DETECTED Final  A.calcoaceticus-baumannii NOT DETECTED NOT DETECTED Final   Bacteroides fragilis NOT DETECTED NOT DETECTED Final   Enterobacterales NOT DETECTED NOT DETECTED Final   Enterobacter cloacae complex NOT DETECTED NOT DETECTED Final   Escherichia coli NOT DETECTED NOT DETECTED Final   Klebsiella aerogenes NOT DETECTED NOT DETECTED Final   Klebsiella oxytoca NOT DETECTED NOT DETECTED Final   Klebsiella pneumoniae NOT DETECTED NOT DETECTED Final   Proteus species NOT DETECTED NOT DETECTED Final   Salmonella species NOT DETECTED NOT DETECTED Final   Serratia marcescens NOT DETECTED NOT DETECTED Final   Haemophilus influenzae NOT DETECTED NOT DETECTED Final   Neisseria meningitidis NOT DETECTED NOT DETECTED Final   Pseudomonas aeruginosa NOT DETECTED NOT DETECTED Final   Stenotrophomonas maltophilia NOT  DETECTED NOT DETECTED Final   Candida albicans NOT DETECTED NOT DETECTED Final   Candida auris NOT DETECTED NOT DETECTED Final   Candida glabrata NOT DETECTED NOT DETECTED Final   Candida krusei NOT DETECTED NOT DETECTED Final   Candida parapsilosis NOT DETECTED NOT DETECTED Final   Candida tropicalis NOT DETECTED NOT DETECTED Final   Cryptococcus neoformans/gattii NOT DETECTED NOT DETECTED Final    Comment: Performed at [facility], [address] [place], [place] [address]  Blood Culture (routine x 2)     Status: None   Collection Time: 07/20/23 11:43 PM   Specimen: BLOOD  Result Value Ref Range Status   Specimen Description BLOOD BLOOD LEFT ARM  Final   Special Requests   Final    BOTTLES DRAWN AEROBIC AND ANAEROBIC Blood Culture adequate volume   Culture   Final    NO GROWTH 5 DAYS Performed at [facility], [address] [place], [place] [address]    Report Status 07/26/2023 FINAL  Final  Resp panel by RT-PCR (RSV, Flu A&B, Covid) Anterior Nasal Swab     Status: None   Collection Time: 07/20/23 11:43 PM   Specimen: Anterior Nasal Swab  Result Value Ref Range Status   SARS Coronavirus 2 by RT PCR NEGATIVE NEGATIVE Final   Influenza A by PCR NEGATIVE NEGATIVE Final   Influenza B by PCR NEGATIVE NEGATIVE Final    Comment: (NOTE) The Xpert Xpress SARS-CoV-2/FLU/RSV plus assay is intended as an aid in the diagnosis of influenza from Nasopharyngeal swab specimens and should not be used as a sole basis for treatment. Nasal washings and aspirates are unacceptable for Xpert Xpress SARS-CoV-2/FLU/RSV testing.  Fact Sheet for Patients: BloggerCourse.com  Fact Sheet for Healthcare Providers: SeriousBroker.it  This test is not yet approved or cleared by the [place] FDA and has been authorized for detection and/or diagnosis of SARS-CoV-2 by FDA under an Emergency Use Authorization (EUA). This EUA will remain in  effect (meaning this test can be used) for the duration of the COVID-19 declaration under Section 564(b)(1) of the Act, 21 U.S.C. section 360bbb-3(b)(1), unless the authorization is terminated or revoked.     Resp Syncytial Virus by PCR NEGATIVE NEGATIVE Final    Comment: (NOTE) Fact Sheet for Patients: BloggerCourse.com  Fact Sheet for Healthcare Providers: SeriousBroker.it  This test is not yet approved or cleared by the [place] FDA and has been authorized for detection and/or diagnosis of SARS-CoV-2 by FDA under an Emergency Use Authorization (EUA). This EUA will remain in effect (meaning this test can be used) for the duration of the COVID-19 declaration under Section 564(b)(1) of the Act, 21 U.S.C. section 360bbb-3(b)(1), unless the authorization is terminated or revoked.  Performed  at [facility], [address] [place], [place] [address]      Labs: BNP (last 3 results) Recent Labs    03/09/23 0748  BNP 235.4*   Basic Metabolic Panel: Recent Labs  Lab 07/23/23 0327 07/24/23 0608 07/25/23 0620 07/26/23 0619 07/27/23 0334  NA 148* 149* 145 143 138  K 3.3* 3.3* 3.6 3.8 3.8  CL 111 109 102 103 98  CO2 31 30 33* 32 31  GLUCOSE 171* 276* 145* 146* 173*  BUN 57* 30* 24* 26* 22  CREATININE 0.83 0.71 0.65 0.57 0.51  CALCIUM 8.4* 9.0 8.8* 9.0 8.6*  MG 2.9* 2.2 2.2 2.2 2.1  PHOS 2.6  --   --   --   --    Liver Function Tests: Recent Labs  Lab 07/20/23 2341 07/23/23 0327  AST 100* 177*  ALT 86* 118*  ALKPHOS 79 78  BILITOT 0.8 0.3  PROT 6.9 6.2*  ALBUMIN 1.9* <1.5*   No results for input(s): "LIPASE", "AMYLASE" in the last 168 hours. No results for input(s): "AMMONIA" in the last 168 hours. CBC: Recent Labs  Lab 07/20/23 2341 07/21/23 0407 07/23/23 0327 07/24/23 6045 07/26/23 0619 07/27/23 0334  WBC 23.2* 20.1* 10.6* 11.0* 9.0 8.6  NEUTROABS 19.9*  --  8.6* 9.5* 6.6 6.5  HGB 10.0*  8.1* 7.9* 9.5* 7.8* 8.4*  HCT 31.8* 28.2* 26.4* 31.6* 26.0* 27.6*  MCV 98.1 102.5* 97.4 95.8 95.9 94.8  PLT 185 134* 137* 162 185 186   Cardiac Enzymes: No results for input(s): "CKTOTAL", "CKMB", "CKMBINDEX", "TROPONINI" in the last 168 hours. BNP: Invalid input(s): "POCBNP" CBG: Recent Labs  Lab 07/26/23 1634 07/26/23 2103 07/27/23 0006 07/27/23 0415 07/27/23 0741  GLUCAP 150* 168* 181* 182* 197*   D-Dimer No results for input(s): "DDIMER" in the last 72 hours. Hgb A1c No results for input(s): "HGBA1C" in the last 72 hours. Lipid Profile No results for input(s): "CHOL", "HDL", "LDLCALC", "TRIG", "CHOLHDL", "LDLDIRECT" in the last 72 hours. Thyroid function studies No results for input(s): "TSH", "T4TOTAL", "T3FREE", "THYROIDAB" in the last 72 hours.  Invalid input(s): "FREET3" Anemia work up No results for input(s): "VITAMINB12", "FOLATE", "FERRITIN", "TIBC", "IRON", "RETICCTPCT" in the last 72 hours. Urinalysis    Component Value Date/Time   COLORURINE AMBER (A) 07/20/2023 2343   APPEARANCEUR CLOUDY (A) 07/20/2023 2343   LABSPEC 1.021 07/20/2023 2343   PHURINE 7.0 07/20/2023 2343   GLUCOSEU NEGATIVE 07/20/2023 2343   HGBUR NEGATIVE 07/20/2023 2343   BILIRUBINUR NEGATIVE 07/20/2023 2343   KETONESUR NEGATIVE 07/20/2023 2343   PROTEINUR 100 (A) 07/20/2023 2343   NITRITE NEGATIVE 07/20/2023 2343   LEUKOCYTESUR MODERATE (A) 07/20/2023 2343   Sepsis Labs Recent Labs  Lab 07/23/23 0327 07/24/23 0608 07/26/23 0619 07/27/23 0334  WBC 10.6* 11.0* 9.0 8.6   Microbiology Recent Results (from the past 240 hours)  Blood Culture (routine x 2)     Status: Abnormal   Collection Time: 07/20/23 11:39 PM   Specimen: BLOOD RIGHT HAND  Result Value Ref Range Status   Specimen Description BLOOD RIGHT HAND  Final   Special Requests   Final    BOTTLES DRAWN AEROBIC AND ANAEROBIC Blood Culture adequate volume   Culture  Setup Time   Final    GRAM POSITIVE COCCI IN  CLUSTERS IN BOTH AEROBIC AND ANAEROBIC BOTTLES CRITICAL RESULT CALLED TO, READ BACK BY AND VERIFIED WITH: PHARMD E. PAYTES [id number] @ 1937 FH    Culture (A)  Final    STAPHYLOCOCCUS  HOMINIS THE SIGNIFICANCE OF ISOLATING THIS ORGANISM FROM A SINGLE SET OF BLOOD CULTURES WHEN MULTIPLE SETS ARE DRAWN IS UNCERTAIN. PLEASE NOTIFY THE MICROBIOLOGY DEPARTMENT WITHIN ONE WEEK IF SPECIATION AND SENSITIVITIES ARE REQUIRED. Performed at [facility], [address] [place], [place] [address]    Report Status 07/23/2023 FINAL  Final  Blood Culture ID Panel (Reflexed)     Status: Abnormal   Collection Time: 07/20/23 11:39 PM  Result Value Ref Range Status   Enterococcus faecalis NOT DETECTED NOT DETECTED Final   Enterococcus Faecium NOT DETECTED NOT DETECTED Final   Listeria monocytogenes NOT DETECTED NOT DETECTED Final   Staphylococcus species DETECTED (A) NOT DETECTED Final    Comment: CRITICAL RESULT CALLED TO, READ BACK BY AND VERIFIED WITH: PHARMD E. PAYTES [id number] @ 1937 FH    Staphylococcus aureus (BCID) NOT DETECTED NOT DETECTED Final   Staphylococcus epidermidis NOT DETECTED NOT DETECTED Final   Staphylococcus lugdunensis NOT DETECTED NOT DETECTED Final   Streptococcus species NOT DETECTED NOT DETECTED Final   Streptococcus agalactiae NOT DETECTED NOT DETECTED Final   Streptococcus pneumoniae NOT DETECTED NOT DETECTED Final   Streptococcus pyogenes NOT DETECTED NOT DETECTED Final   A.calcoaceticus-baumannii NOT DETECTED NOT DETECTED Final   Bacteroides fragilis NOT DETECTED NOT DETECTED Final   Enterobacterales NOT DETECTED NOT DETECTED Final   Enterobacter cloacae complex NOT DETECTED NOT DETECTED Final   Escherichia coli NOT DETECTED NOT DETECTED Final   Klebsiella aerogenes NOT DETECTED NOT DETECTED Final   Klebsiella oxytoca NOT DETECTED NOT DETECTED Final   Klebsiella pneumoniae NOT DETECTED NOT DETECTED Final   Proteus species NOT DETECTED NOT DETECTED Final   Salmonella  species NOT DETECTED NOT DETECTED Final   Serratia marcescens NOT DETECTED NOT DETECTED Final   Haemophilus influenzae NOT DETECTED NOT DETECTED Final   Neisseria meningitidis NOT DETECTED NOT DETECTED Final   Pseudomonas aeruginosa NOT DETECTED NOT DETECTED Final   Stenotrophomonas maltophilia NOT DETECTED NOT DETECTED Final   Candida albicans NOT DETECTED NOT DETECTED Final   Candida auris NOT DETECTED NOT DETECTED Final   Candida glabrata NOT DETECTED NOT DETECTED Final   Candida krusei NOT DETECTED NOT DETECTED Final   Candida parapsilosis NOT DETECTED NOT DETECTED Final   Candida tropicalis NOT DETECTED NOT DETECTED Final   Cryptococcus neoformans/gattii NOT DETECTED NOT DETECTED Final    Comment: Performed at [facility], [address], [place], [place] [address]  Blood Culture (routine x 2)     Status: None   Collection Time: 07/20/23 11:43 PM   Specimen: BLOOD  Result Value Ref Range Status   Specimen Description BLOOD BLOOD LEFT ARM  Final   Special Requests   Final    BOTTLES DRAWN AEROBIC AND ANAEROBIC Blood Culture adequate volume   Culture   Final    NO GROWTH 5 DAYS Performed at [facility], [address] [place], [place] [address]    Report Status 07/26/2023 FINAL  Final  Resp panel by RT-PCR (RSV, Flu A&B, Covid) Anterior Nasal Swab     Status: None   Collection Time: 07/20/23 11:43 PM   Specimen: Anterior Nasal Swab  Result Value Ref Range Status   SARS Coronavirus 2 by RT PCR NEGATIVE NEGATIVE Final   Influenza A by PCR NEGATIVE NEGATIVE Final   Influenza B by PCR NEGATIVE NEGATIVE Final    Comment: (NOTE) The Xpert Xpress SARS-CoV-2/FLU/RSV plus assay is intended as an aid in the diagnosis of influenza from  Nasopharyngeal swab specimens and should not be used as a sole basis for treatment. Nasal washings and aspirates are unacceptable for Xpert Xpress SARS-CoV-2/FLU/RSV testing.  Fact Sheet for  Patients: BloggerCourse.com  Fact Sheet for Healthcare Providers: SeriousBroker.it  This test is not yet approved or cleared by the [place] FDA and has been authorized for detection and/or diagnosis of SARS-CoV-2 by FDA under an Emergency Use Authorization (EUA). This EUA will remain in effect (meaning this test can be used) for the duration of the COVID-19 declaration under Section 564(b)(1) of the Act, 21 U.S.C. section 360bbb-3(b)(1), unless the authorization is terminated or revoked.     Resp Syncytial Virus by PCR NEGATIVE NEGATIVE Final    Comment: (NOTE) Fact Sheet for Patients: BloggerCourse.com  Fact Sheet for Healthcare Providers: SeriousBroker.it  This test is not yet approved or cleared by the [place] FDA and has been authorized for detection and/or diagnosis of SARS-CoV-2 by FDA under an Emergency Use Authorization (EUA). This EUA will remain in effect (meaning this test can be used) for the duration of the COVID-19 declaration under Section 564(b)(1) of the Act, 21 U.S.C. section 360bbb-3(b)(1), unless the authorization is terminated or revoked.  Performed at [facility], [address] [place], [place] [address]      Time coordinating discharge: 35 minutes  SIGNED:   Glade Lloyd, MD  [facility] 07/27/2023, 8:23 AM

## 2023-07-27 NOTE — Care Management Important Message (Signed)
Important Message  Patient Details  Name: Gina Kelley MRN: [id number] Date of Birth: 08-04-1935   Important Message Given:        Mardene Sayer 07/27/2023, 11:21 AM S/W : Gwenette Greet , IM mailed  to Daughter

## 2023-07-27 NOTE — Progress Notes (Signed)
Attempted to give report to nurse at [facility] 3 times, however no one answered. Message for call back was left with front desk.

## 2023-07-28 DIAGNOSIS — D649 Anemia, unspecified: Secondary | ICD-10-CM | POA: Diagnosis not present

## 2023-07-28 DIAGNOSIS — I10 Essential (primary) hypertension: Secondary | ICD-10-CM | POA: Diagnosis not present

## 2023-07-28 DIAGNOSIS — E119 Type 2 diabetes mellitus without complications: Secondary | ICD-10-CM | POA: Diagnosis not present

## 2023-07-30 DIAGNOSIS — M6281 Muscle weakness (generalized): Secondary | ICD-10-CM | POA: Diagnosis not present

## 2023-07-30 DIAGNOSIS — A419 Sepsis, unspecified organism: Secondary | ICD-10-CM | POA: Diagnosis not present

## 2023-07-30 DIAGNOSIS — I69322 Dysarthria following cerebral infarction: Secondary | ICD-10-CM | POA: Diagnosis not present

## 2023-07-30 DIAGNOSIS — D62 Acute posthemorrhagic anemia: Secondary | ICD-10-CM | POA: Diagnosis not present

## 2023-07-30 DIAGNOSIS — R471 Dysarthria and anarthria: Secondary | ICD-10-CM | POA: Diagnosis not present

## 2023-07-30 DIAGNOSIS — I69354 Hemiplegia and hemiparesis following cerebral infarction affecting left non-dominant side: Secondary | ICD-10-CM | POA: Diagnosis not present

## 2023-07-31 DIAGNOSIS — M6281 Muscle weakness (generalized): Secondary | ICD-10-CM | POA: Diagnosis not present

## 2023-07-31 DIAGNOSIS — I69322 Dysarthria following cerebral infarction: Secondary | ICD-10-CM | POA: Diagnosis not present

## 2023-07-31 DIAGNOSIS — D62 Acute posthemorrhagic anemia: Secondary | ICD-10-CM | POA: Diagnosis not present

## 2023-07-31 DIAGNOSIS — I69354 Hemiplegia and hemiparesis following cerebral infarction affecting left non-dominant side: Secondary | ICD-10-CM | POA: Diagnosis not present

## 2023-07-31 DIAGNOSIS — R471 Dysarthria and anarthria: Secondary | ICD-10-CM | POA: Diagnosis not present

## 2023-07-31 DIAGNOSIS — A419 Sepsis, unspecified organism: Secondary | ICD-10-CM | POA: Diagnosis not present

## 2023-08-01 DIAGNOSIS — A419 Sepsis, unspecified organism: Secondary | ICD-10-CM | POA: Diagnosis not present

## 2023-08-01 DIAGNOSIS — R471 Dysarthria and anarthria: Secondary | ICD-10-CM | POA: Diagnosis not present

## 2023-08-01 DIAGNOSIS — M6281 Muscle weakness (generalized): Secondary | ICD-10-CM | POA: Diagnosis not present

## 2023-08-01 DIAGNOSIS — I69354 Hemiplegia and hemiparesis following cerebral infarction affecting left non-dominant side: Secondary | ICD-10-CM | POA: Diagnosis not present

## 2023-08-01 DIAGNOSIS — D62 Acute posthemorrhagic anemia: Secondary | ICD-10-CM | POA: Diagnosis not present

## 2023-08-01 DIAGNOSIS — I69322 Dysarthria following cerebral infarction: Secondary | ICD-10-CM | POA: Diagnosis not present

## 2023-08-02 DIAGNOSIS — A419 Sepsis, unspecified organism: Secondary | ICD-10-CM | POA: Diagnosis not present

## 2023-08-02 DIAGNOSIS — I69354 Hemiplegia and hemiparesis following cerebral infarction affecting left non-dominant side: Secondary | ICD-10-CM | POA: Diagnosis not present

## 2023-08-02 DIAGNOSIS — D62 Acute posthemorrhagic anemia: Secondary | ICD-10-CM | POA: Diagnosis not present

## 2023-08-02 DIAGNOSIS — M6281 Muscle weakness (generalized): Secondary | ICD-10-CM | POA: Diagnosis not present

## 2023-08-02 DIAGNOSIS — I69322 Dysarthria following cerebral infarction: Secondary | ICD-10-CM | POA: Diagnosis not present

## 2023-08-02 DIAGNOSIS — R471 Dysarthria and anarthria: Secondary | ICD-10-CM | POA: Diagnosis not present

## 2023-08-03 DIAGNOSIS — R471 Dysarthria and anarthria: Secondary | ICD-10-CM | POA: Diagnosis not present

## 2023-08-03 DIAGNOSIS — I69322 Dysarthria following cerebral infarction: Secondary | ICD-10-CM | POA: Diagnosis not present

## 2023-08-03 DIAGNOSIS — A419 Sepsis, unspecified organism: Secondary | ICD-10-CM | POA: Diagnosis not present

## 2023-08-03 DIAGNOSIS — I69354 Hemiplegia and hemiparesis following cerebral infarction affecting left non-dominant side: Secondary | ICD-10-CM | POA: Diagnosis not present

## 2023-08-03 DIAGNOSIS — M6281 Muscle weakness (generalized): Secondary | ICD-10-CM | POA: Diagnosis not present

## 2023-08-03 DIAGNOSIS — L89322 Pressure ulcer of left buttock, stage 2: Secondary | ICD-10-CM | POA: Diagnosis not present

## 2023-08-03 DIAGNOSIS — D62 Acute posthemorrhagic anemia: Secondary | ICD-10-CM | POA: Diagnosis not present

## 2023-08-04 DIAGNOSIS — R471 Dysarthria and anarthria: Secondary | ICD-10-CM | POA: Diagnosis not present

## 2023-08-04 DIAGNOSIS — M6281 Muscle weakness (generalized): Secondary | ICD-10-CM | POA: Diagnosis not present

## 2023-08-04 DIAGNOSIS — I69354 Hemiplegia and hemiparesis following cerebral infarction affecting left non-dominant side: Secondary | ICD-10-CM | POA: Diagnosis not present

## 2023-08-04 DIAGNOSIS — I69322 Dysarthria following cerebral infarction: Secondary | ICD-10-CM | POA: Diagnosis not present

## 2023-08-04 DIAGNOSIS — D62 Acute posthemorrhagic anemia: Secondary | ICD-10-CM | POA: Diagnosis not present

## 2023-08-04 DIAGNOSIS — A419 Sepsis, unspecified organism: Secondary | ICD-10-CM | POA: Diagnosis not present

## 2023-08-05 DIAGNOSIS — M6281 Muscle weakness (generalized): Secondary | ICD-10-CM | POA: Diagnosis not present

## 2023-08-05 DIAGNOSIS — I69354 Hemiplegia and hemiparesis following cerebral infarction affecting left non-dominant side: Secondary | ICD-10-CM | POA: Diagnosis not present

## 2023-08-05 DIAGNOSIS — Z515 Encounter for palliative care: Secondary | ICD-10-CM | POA: Diagnosis not present

## 2023-08-05 DIAGNOSIS — I619 Nontraumatic intracerebral hemorrhage, unspecified: Secondary | ICD-10-CM | POA: Diagnosis not present

## 2023-08-05 DIAGNOSIS — R471 Dysarthria and anarthria: Secondary | ICD-10-CM | POA: Diagnosis not present

## 2023-08-05 DIAGNOSIS — D649 Anemia, unspecified: Secondary | ICD-10-CM | POA: Diagnosis not present

## 2023-08-05 DIAGNOSIS — J189 Pneumonia, unspecified organism: Secondary | ICD-10-CM | POA: Diagnosis not present

## 2023-08-05 DIAGNOSIS — A419 Sepsis, unspecified organism: Secondary | ICD-10-CM | POA: Diagnosis not present

## 2023-08-05 DIAGNOSIS — I69322 Dysarthria following cerebral infarction: Secondary | ICD-10-CM | POA: Diagnosis not present

## 2023-08-05 DIAGNOSIS — E119 Type 2 diabetes mellitus without complications: Secondary | ICD-10-CM | POA: Diagnosis not present

## 2023-08-05 DIAGNOSIS — I10 Essential (primary) hypertension: Secondary | ICD-10-CM | POA: Diagnosis not present

## 2023-08-05 DIAGNOSIS — N390 Urinary tract infection, site not specified: Secondary | ICD-10-CM | POA: Diagnosis not present

## 2023-08-05 DIAGNOSIS — G928 Other toxic encephalopathy: Secondary | ICD-10-CM | POA: Diagnosis not present

## 2023-08-05 DIAGNOSIS — D62 Acute posthemorrhagic anemia: Secondary | ICD-10-CM | POA: Diagnosis not present

## 2023-08-06 DIAGNOSIS — R471 Dysarthria and anarthria: Secondary | ICD-10-CM | POA: Diagnosis not present

## 2023-08-06 DIAGNOSIS — M6281 Muscle weakness (generalized): Secondary | ICD-10-CM | POA: Diagnosis not present

## 2023-08-06 DIAGNOSIS — I69322 Dysarthria following cerebral infarction: Secondary | ICD-10-CM | POA: Diagnosis not present

## 2023-08-06 DIAGNOSIS — A419 Sepsis, unspecified organism: Secondary | ICD-10-CM | POA: Diagnosis not present

## 2023-08-06 DIAGNOSIS — D62 Acute posthemorrhagic anemia: Secondary | ICD-10-CM | POA: Diagnosis not present

## 2023-08-06 DIAGNOSIS — I69354 Hemiplegia and hemiparesis following cerebral infarction affecting left non-dominant side: Secondary | ICD-10-CM | POA: Diagnosis not present

## 2023-08-07 DIAGNOSIS — M6281 Muscle weakness (generalized): Secondary | ICD-10-CM | POA: Diagnosis not present

## 2023-08-07 DIAGNOSIS — I69354 Hemiplegia and hemiparesis following cerebral infarction affecting left non-dominant side: Secondary | ICD-10-CM | POA: Diagnosis not present

## 2023-08-07 DIAGNOSIS — A419 Sepsis, unspecified organism: Secondary | ICD-10-CM | POA: Diagnosis not present

## 2023-08-07 DIAGNOSIS — R471 Dysarthria and anarthria: Secondary | ICD-10-CM | POA: Diagnosis not present

## 2023-08-07 DIAGNOSIS — D62 Acute posthemorrhagic anemia: Secondary | ICD-10-CM | POA: Diagnosis not present

## 2023-08-07 DIAGNOSIS — I69322 Dysarthria following cerebral infarction: Secondary | ICD-10-CM | POA: Diagnosis not present

## 2023-08-09 DIAGNOSIS — R471 Dysarthria and anarthria: Secondary | ICD-10-CM | POA: Diagnosis not present

## 2023-08-09 DIAGNOSIS — A419 Sepsis, unspecified organism: Secondary | ICD-10-CM | POA: Diagnosis not present

## 2023-08-09 DIAGNOSIS — I69322 Dysarthria following cerebral infarction: Secondary | ICD-10-CM | POA: Diagnosis not present

## 2023-08-09 DIAGNOSIS — D62 Acute posthemorrhagic anemia: Secondary | ICD-10-CM | POA: Diagnosis not present

## 2023-08-09 DIAGNOSIS — I69354 Hemiplegia and hemiparesis following cerebral infarction affecting left non-dominant side: Secondary | ICD-10-CM | POA: Diagnosis not present

## 2023-08-09 DIAGNOSIS — M6281 Muscle weakness (generalized): Secondary | ICD-10-CM | POA: Diagnosis not present

## 2023-08-10 DIAGNOSIS — I69354 Hemiplegia and hemiparesis following cerebral infarction affecting left non-dominant side: Secondary | ICD-10-CM | POA: Diagnosis not present

## 2023-08-10 DIAGNOSIS — R471 Dysarthria and anarthria: Secondary | ICD-10-CM | POA: Diagnosis not present

## 2023-08-10 DIAGNOSIS — M6281 Muscle weakness (generalized): Secondary | ICD-10-CM | POA: Diagnosis not present

## 2023-08-10 DIAGNOSIS — I69322 Dysarthria following cerebral infarction: Secondary | ICD-10-CM | POA: Diagnosis not present

## 2023-08-10 DIAGNOSIS — A419 Sepsis, unspecified organism: Secondary | ICD-10-CM | POA: Diagnosis not present

## 2023-08-10 DIAGNOSIS — L89322 Pressure ulcer of left buttock, stage 2: Secondary | ICD-10-CM | POA: Diagnosis not present

## 2023-08-10 DIAGNOSIS — D62 Acute posthemorrhagic anemia: Secondary | ICD-10-CM | POA: Diagnosis not present

## 2023-08-11 DIAGNOSIS — D62 Acute posthemorrhagic anemia: Secondary | ICD-10-CM | POA: Diagnosis not present

## 2023-08-11 DIAGNOSIS — A419 Sepsis, unspecified organism: Secondary | ICD-10-CM | POA: Diagnosis not present

## 2023-08-11 DIAGNOSIS — I69322 Dysarthria following cerebral infarction: Secondary | ICD-10-CM | POA: Diagnosis not present

## 2023-08-11 DIAGNOSIS — M6281 Muscle weakness (generalized): Secondary | ICD-10-CM | POA: Diagnosis not present

## 2023-08-11 DIAGNOSIS — I69354 Hemiplegia and hemiparesis following cerebral infarction affecting left non-dominant side: Secondary | ICD-10-CM | POA: Diagnosis not present

## 2023-08-11 DIAGNOSIS — R471 Dysarthria and anarthria: Secondary | ICD-10-CM | POA: Diagnosis not present

## 2023-08-12 DIAGNOSIS — I69354 Hemiplegia and hemiparesis following cerebral infarction affecting left non-dominant side: Secondary | ICD-10-CM | POA: Diagnosis not present

## 2023-08-12 DIAGNOSIS — L89152 Pressure ulcer of sacral region, stage 2: Secondary | ICD-10-CM | POA: Diagnosis not present

## 2023-08-12 DIAGNOSIS — I619 Nontraumatic intracerebral hemorrhage, unspecified: Secondary | ICD-10-CM | POA: Diagnosis not present

## 2023-08-12 DIAGNOSIS — R471 Dysarthria and anarthria: Secondary | ICD-10-CM | POA: Diagnosis not present

## 2023-08-12 DIAGNOSIS — Z515 Encounter for palliative care: Secondary | ICD-10-CM | POA: Diagnosis not present

## 2023-08-12 DIAGNOSIS — I10 Essential (primary) hypertension: Secondary | ICD-10-CM | POA: Diagnosis not present

## 2023-08-12 DIAGNOSIS — A419 Sepsis, unspecified organism: Secondary | ICD-10-CM | POA: Diagnosis not present

## 2023-08-12 DIAGNOSIS — I69322 Dysarthria following cerebral infarction: Secondary | ICD-10-CM | POA: Diagnosis not present

## 2023-08-12 DIAGNOSIS — D62 Acute posthemorrhagic anemia: Secondary | ICD-10-CM | POA: Diagnosis not present

## 2023-08-12 DIAGNOSIS — F0150 Vascular dementia without behavioral disturbance: Secondary | ICD-10-CM | POA: Diagnosis not present

## 2023-08-12 DIAGNOSIS — M6281 Muscle weakness (generalized): Secondary | ICD-10-CM | POA: Diagnosis not present

## 2023-08-17 DIAGNOSIS — L89322 Pressure ulcer of left buttock, stage 2: Secondary | ICD-10-CM | POA: Diagnosis not present

## 2023-08-19 DIAGNOSIS — L89322 Pressure ulcer of left buttock, stage 2: Secondary | ICD-10-CM | POA: Diagnosis not present

## 2023-08-24 DIAGNOSIS — L89322 Pressure ulcer of left buttock, stage 2: Secondary | ICD-10-CM | POA: Diagnosis not present

## 2023-08-28 DIAGNOSIS — E119 Type 2 diabetes mellitus without complications: Secondary | ICD-10-CM | POA: Diagnosis not present

## 2023-08-28 DIAGNOSIS — N189 Chronic kidney disease, unspecified: Secondary | ICD-10-CM | POA: Diagnosis not present

## 2023-08-28 DIAGNOSIS — D649 Anemia, unspecified: Secondary | ICD-10-CM | POA: Diagnosis not present

## 2023-08-28 DIAGNOSIS — F0150 Vascular dementia without behavioral disturbance: Secondary | ICD-10-CM | POA: Diagnosis not present

## 2023-08-28 DIAGNOSIS — I10 Essential (primary) hypertension: Secondary | ICD-10-CM | POA: Diagnosis not present

## 2023-08-31 DIAGNOSIS — L89322 Pressure ulcer of left buttock, stage 2: Secondary | ICD-10-CM | POA: Diagnosis not present

## 2023-09-07 DIAGNOSIS — Z515 Encounter for palliative care: Secondary | ICD-10-CM | POA: Diagnosis not present

## 2023-09-08 DIAGNOSIS — R627 Adult failure to thrive: Secondary | ICD-10-CM | POA: Diagnosis not present

## 2023-09-08 DIAGNOSIS — I10 Essential (primary) hypertension: Secondary | ICD-10-CM | POA: Diagnosis not present

## 2023-09-08 DIAGNOSIS — F0150 Vascular dementia without behavioral disturbance: Secondary | ICD-10-CM | POA: Diagnosis not present

## 2023-09-08 DIAGNOSIS — D649 Anemia, unspecified: Secondary | ICD-10-CM | POA: Diagnosis not present

## 2023-09-08 DIAGNOSIS — E119 Type 2 diabetes mellitus without complications: Secondary | ICD-10-CM | POA: Diagnosis not present

## 2023-09-14 DIAGNOSIS — L89322 Pressure ulcer of left buttock, stage 2: Secondary | ICD-10-CM | POA: Diagnosis not present

## 2023-09-15 DIAGNOSIS — R627 Adult failure to thrive: Secondary | ICD-10-CM | POA: Diagnosis not present

## 2023-09-21 DIAGNOSIS — R627 Adult failure to thrive: Secondary | ICD-10-CM | POA: Diagnosis not present

## 2023-09-21 DIAGNOSIS — E119 Type 2 diabetes mellitus without complications: Secondary | ICD-10-CM | POA: Diagnosis not present

## 2023-09-21 DIAGNOSIS — L89152 Pressure ulcer of sacral region, stage 2: Secondary | ICD-10-CM | POA: Diagnosis not present

## 2023-09-21 DIAGNOSIS — Z931 Gastrostomy status: Secondary | ICD-10-CM | POA: Diagnosis not present

## 2023-09-21 DIAGNOSIS — I619 Nontraumatic intracerebral hemorrhage, unspecified: Secondary | ICD-10-CM | POA: Diagnosis not present

## 2023-10-08 DIAGNOSIS — R627 Adult failure to thrive: Secondary | ICD-10-CM | POA: Diagnosis not present

## 2023-10-08 DIAGNOSIS — D649 Anemia, unspecified: Secondary | ICD-10-CM | POA: Diagnosis not present

## 2023-10-08 DIAGNOSIS — F0150 Vascular dementia without behavioral disturbance: Secondary | ICD-10-CM | POA: Diagnosis not present

## 2023-10-24 DIAGNOSIS — R627 Adult failure to thrive: Secondary | ICD-10-CM | POA: Diagnosis not present

## 2023-10-24 DIAGNOSIS — I10 Essential (primary) hypertension: Secondary | ICD-10-CM | POA: Diagnosis not present

## 2023-10-24 DIAGNOSIS — N189 Chronic kidney disease, unspecified: Secondary | ICD-10-CM | POA: Diagnosis not present

## 2023-10-24 DIAGNOSIS — E119 Type 2 diabetes mellitus without complications: Secondary | ICD-10-CM | POA: Diagnosis not present

## 2023-10-24 DIAGNOSIS — F0150 Vascular dementia without behavioral disturbance: Secondary | ICD-10-CM | POA: Diagnosis not present

## 2023-10-24 DIAGNOSIS — G40909 Epilepsy, unspecified, not intractable, without status epilepticus: Secondary | ICD-10-CM | POA: Diagnosis not present

## 2023-12-15 DIAGNOSIS — Z23 Encounter for immunization: Secondary | ICD-10-CM | POA: Diagnosis not present

## 2024-03-30 DEATH — deceased
